# Patient Record
Sex: Male | Born: 1960 | Race: White | Hispanic: No | Marital: Married
Health system: Southern US, Community
[De-identification: ages and names within clinical notes are randomized; demographics above are authoritative.]

---

## 2020-12-10 ENCOUNTER — Emergency Department (HOSPITAL_COMMUNITY): Payer: Medicaid Other

## 2020-12-10 ENCOUNTER — Other Ambulatory Visit: Payer: Self-pay

## 2020-12-10 ENCOUNTER — Inpatient Hospital Stay (HOSPITAL_COMMUNITY): Payer: Medicaid Other

## 2020-12-10 ENCOUNTER — Inpatient Hospital Stay (HOSPITAL_COMMUNITY)
Admission: EM | Admit: 2020-12-10 | Discharge: 2021-02-02 | DRG: 853 | Disposition: A | Discharge status: Skilled Nursing Facility | Payer: Medicaid Other | Attending: Internal Medicine | Admitting: Internal Medicine

## 2020-12-10 DIAGNOSIS — I714 Abdominal aortic aneurysm, without rupture: Secondary | ICD-10-CM | POA: Diagnosis present

## 2020-12-10 DIAGNOSIS — E870 Hyperosmolality and hypernatremia: Secondary | ICD-10-CM | POA: Diagnosis not present

## 2020-12-10 DIAGNOSIS — R54 Age-related physical debility: Secondary | ICD-10-CM | POA: Diagnosis not present

## 2020-12-10 DIAGNOSIS — E785 Hyperlipidemia, unspecified: Secondary | ICD-10-CM | POA: Diagnosis present

## 2020-12-10 DIAGNOSIS — J9600 Acute respiratory failure, unspecified whether with hypoxia or hypercapnia: Secondary | ICD-10-CM | POA: Diagnosis present

## 2020-12-10 DIAGNOSIS — Z01818 Encounter for other preprocedural examination: Secondary | ICD-10-CM

## 2020-12-10 DIAGNOSIS — R451 Restlessness and agitation: Secondary | ICD-10-CM | POA: Diagnosis not present

## 2020-12-10 DIAGNOSIS — K5900 Constipation, unspecified: Secondary | ICD-10-CM | POA: Diagnosis not present

## 2020-12-10 DIAGNOSIS — R1314 Dysphagia, pharyngoesophageal phase: Secondary | ICD-10-CM

## 2020-12-10 DIAGNOSIS — G255 Other chorea: Secondary | ICD-10-CM | POA: Diagnosis present

## 2020-12-10 DIAGNOSIS — Z781 Physical restraint status: Secondary | ICD-10-CM

## 2020-12-10 DIAGNOSIS — Y848 Other medical procedures as the cause of abnormal reaction of the patient, or of later complication, without mention of misadventure at the time of the procedure: Secondary | ICD-10-CM | POA: Diagnosis not present

## 2020-12-10 DIAGNOSIS — A4101 Sepsis due to Methicillin susceptible Staphylococcus aureus: Secondary | ICD-10-CM | POA: Diagnosis not present

## 2020-12-10 DIAGNOSIS — B957 Other staphylococcus as the cause of diseases classified elsewhere: Secondary | ICD-10-CM

## 2020-12-10 DIAGNOSIS — J9602 Acute respiratory failure with hypercapnia: Secondary | ICD-10-CM | POA: Diagnosis present

## 2020-12-10 DIAGNOSIS — T17590A Other foreign object in bronchus causing asphyxiation, initial encounter: Secondary | ICD-10-CM | POA: Diagnosis not present

## 2020-12-10 DIAGNOSIS — N12 Tubulo-interstitial nephritis, not specified as acute or chronic: Secondary | ICD-10-CM

## 2020-12-10 DIAGNOSIS — Z5181 Encounter for therapeutic drug level monitoring: Secondary | ICD-10-CM

## 2020-12-10 DIAGNOSIS — I639 Cerebral infarction, unspecified: Secondary | ICD-10-CM | POA: Diagnosis not present

## 2020-12-10 DIAGNOSIS — R5381 Other malaise: Secondary | ICD-10-CM

## 2020-12-10 DIAGNOSIS — E871 Hypo-osmolality and hyponatremia: Secondary | ICD-10-CM | POA: Diagnosis present

## 2020-12-10 DIAGNOSIS — Z66 Do not resuscitate: Secondary | ICD-10-CM | POA: Diagnosis not present

## 2020-12-10 DIAGNOSIS — T502X5A Adverse effect of carbonic-anhydrase inhibitors, benzothiadiazides and other diuretics, initial encounter: Secondary | ICD-10-CM | POA: Diagnosis not present

## 2020-12-10 DIAGNOSIS — I8391 Asymptomatic varicose veins of right lower extremity: Secondary | ICD-10-CM | POA: Diagnosis not present

## 2020-12-10 DIAGNOSIS — Z20822 Contact with and (suspected) exposure to covid-19: Secondary | ICD-10-CM | POA: Diagnosis present

## 2020-12-10 DIAGNOSIS — R0602 Shortness of breath: Secondary | ICD-10-CM

## 2020-12-10 DIAGNOSIS — J15211 Pneumonia due to Methicillin susceptible Staphylococcus aureus: Secondary | ICD-10-CM | POA: Diagnosis not present

## 2020-12-10 DIAGNOSIS — N179 Acute kidney failure, unspecified: Secondary | ICD-10-CM

## 2020-12-10 DIAGNOSIS — M549 Dorsalgia, unspecified: Secondary | ICD-10-CM

## 2020-12-10 DIAGNOSIS — Z0189 Encounter for other specified special examinations: Secondary | ICD-10-CM

## 2020-12-10 DIAGNOSIS — J9690 Respiratory failure, unspecified, unspecified whether with hypoxia or hypercapnia: Secondary | ICD-10-CM

## 2020-12-10 DIAGNOSIS — I5041 Acute combined systolic (congestive) and diastolic (congestive) heart failure: Secondary | ICD-10-CM | POA: Diagnosis not present

## 2020-12-10 DIAGNOSIS — G8194 Hemiplegia, unspecified affecting left nondominant side: Secondary | ICD-10-CM | POA: Diagnosis not present

## 2020-12-10 DIAGNOSIS — L8990 Pressure ulcer of unspecified site, unspecified stage: Secondary | ICD-10-CM | POA: Insufficient documentation

## 2020-12-10 DIAGNOSIS — G894 Chronic pain syndrome: Secondary | ICD-10-CM | POA: Diagnosis present

## 2020-12-10 DIAGNOSIS — A689 Relapsing fever, unspecified: Secondary | ICD-10-CM | POA: Diagnosis not present

## 2020-12-10 DIAGNOSIS — R0902 Hypoxemia: Secondary | ICD-10-CM

## 2020-12-10 DIAGNOSIS — I808 Phlebitis and thrombophlebitis of other sites: Secondary | ICD-10-CM | POA: Diagnosis not present

## 2020-12-10 DIAGNOSIS — I82B12 Acute embolism and thrombosis of left subclavian vein: Secondary | ICD-10-CM | POA: Diagnosis present

## 2020-12-10 DIAGNOSIS — L89314 Pressure ulcer of right buttock, stage 4: Secondary | ICD-10-CM | POA: Diagnosis not present

## 2020-12-10 DIAGNOSIS — J690 Pneumonitis due to inhalation of food and vomit: Secondary | ICD-10-CM

## 2020-12-10 DIAGNOSIS — N132 Hydronephrosis with renal and ureteral calculous obstruction: Secondary | ICD-10-CM | POA: Diagnosis present

## 2020-12-10 DIAGNOSIS — B9620 Unspecified Escherichia coli [E. coli] as the cause of diseases classified elsewhere: Secondary | ICD-10-CM

## 2020-12-10 DIAGNOSIS — Z978 Presence of other specified devices: Secondary | ICD-10-CM

## 2020-12-10 DIAGNOSIS — D539 Nutritional anemia, unspecified: Secondary | ICD-10-CM | POA: Diagnosis not present

## 2020-12-10 DIAGNOSIS — T82898A Other specified complication of vascular prosthetic devices, implants and grafts, initial encounter: Secondary | ICD-10-CM | POA: Diagnosis not present

## 2020-12-10 DIAGNOSIS — R410 Disorientation, unspecified: Secondary | ICD-10-CM | POA: Diagnosis present

## 2020-12-10 DIAGNOSIS — L89626 Pressure-induced deep tissue damage of left heel: Secondary | ICD-10-CM | POA: Diagnosis not present

## 2020-12-10 DIAGNOSIS — I77810 Thoracic aortic ectasia: Secondary | ICD-10-CM | POA: Diagnosis present

## 2020-12-10 DIAGNOSIS — J329 Chronic sinusitis, unspecified: Secondary | ICD-10-CM | POA: Diagnosis present

## 2020-12-10 DIAGNOSIS — Z419 Encounter for procedure for purposes other than remedying health state, unspecified: Secondary | ICD-10-CM

## 2020-12-10 DIAGNOSIS — N201 Calculus of ureter: Secondary | ICD-10-CM

## 2020-12-10 DIAGNOSIS — F09 Unspecified mental disorder due to known physiological condition: Secondary | ICD-10-CM | POA: Diagnosis not present

## 2020-12-10 DIAGNOSIS — Y828 Other medical devices associated with adverse incidents: Secondary | ICD-10-CM | POA: Diagnosis not present

## 2020-12-10 DIAGNOSIS — E781 Pure hyperglyceridemia: Secondary | ICD-10-CM | POA: Diagnosis not present

## 2020-12-10 DIAGNOSIS — K7581 Nonalcoholic steatohepatitis (NASH): Secondary | ICD-10-CM | POA: Diagnosis present

## 2020-12-10 DIAGNOSIS — R7881 Bacteremia: Secondary | ICD-10-CM

## 2020-12-10 DIAGNOSIS — Z886 Allergy status to analgesic agent status: Secondary | ICD-10-CM

## 2020-12-10 DIAGNOSIS — K567 Ileus, unspecified: Secondary | ICD-10-CM

## 2020-12-10 DIAGNOSIS — S3732XA Contusion of urethra, initial encounter: Secondary | ICD-10-CM | POA: Diagnosis not present

## 2020-12-10 DIAGNOSIS — E876 Hypokalemia: Secondary | ICD-10-CM | POA: Diagnosis present

## 2020-12-10 DIAGNOSIS — J189 Pneumonia, unspecified organism: Secondary | ICD-10-CM

## 2020-12-10 DIAGNOSIS — I10 Essential (primary) hypertension: Secondary | ICD-10-CM

## 2020-12-10 DIAGNOSIS — Z28310 Unvaccinated for covid-19: Secondary | ICD-10-CM

## 2020-12-10 DIAGNOSIS — I110 Hypertensive heart disease with heart failure: Secondary | ICD-10-CM | POA: Diagnosis present

## 2020-12-10 DIAGNOSIS — A419 Sepsis, unspecified organism: Secondary | ICD-10-CM

## 2020-12-10 DIAGNOSIS — R197 Diarrhea, unspecified: Secondary | ICD-10-CM | POA: Diagnosis present

## 2020-12-10 DIAGNOSIS — E861 Hypovolemia: Secondary | ICD-10-CM | POA: Diagnosis not present

## 2020-12-10 DIAGNOSIS — A4151 Sepsis due to Escherichia coli [E. coli]: Principal | ICD-10-CM | POA: Diagnosis present

## 2020-12-10 DIAGNOSIS — E441 Mild protein-calorie malnutrition: Secondary | ICD-10-CM | POA: Diagnosis present

## 2020-12-10 DIAGNOSIS — Z87891 Personal history of nicotine dependence: Secondary | ICD-10-CM

## 2020-12-10 DIAGNOSIS — Z888 Allergy status to other drugs, medicaments and biological substances status: Secondary | ICD-10-CM

## 2020-12-10 DIAGNOSIS — D65 Disseminated intravascular coagulation [defibrination syndrome]: Secondary | ICD-10-CM | POA: Diagnosis present

## 2020-12-10 DIAGNOSIS — T80211A Bloodstream infection due to central venous catheter, initial encounter: Secondary | ICD-10-CM | POA: Diagnosis not present

## 2020-12-10 DIAGNOSIS — Z8616 Personal history of COVID-19: Secondary | ICD-10-CM | POA: Diagnosis not present

## 2020-12-10 DIAGNOSIS — K7460 Unspecified cirrhosis of liver: Secondary | ICD-10-CM | POA: Diagnosis present

## 2020-12-10 DIAGNOSIS — Z9911 Dependence on respirator [ventilator] status: Secondary | ICD-10-CM

## 2020-12-10 DIAGNOSIS — N170 Acute kidney failure with tubular necrosis: Secondary | ICD-10-CM | POA: Diagnosis present

## 2020-12-10 DIAGNOSIS — I82C12 Acute embolism and thrombosis of left internal jugular vein: Secondary | ICD-10-CM | POA: Diagnosis not present

## 2020-12-10 DIAGNOSIS — T8383XA Hemorrhage of genitourinary prosthetic devices, implants and grafts, initial encounter: Secondary | ICD-10-CM | POA: Diagnosis not present

## 2020-12-10 DIAGNOSIS — G928 Other toxic encephalopathy: Secondary | ICD-10-CM | POA: Diagnosis present

## 2020-12-10 DIAGNOSIS — L89313 Pressure ulcer of right buttock, stage 3: Secondary | ICD-10-CM

## 2020-12-10 DIAGNOSIS — Z289 Immunization not carried out for unspecified reason: Secondary | ICD-10-CM

## 2020-12-10 DIAGNOSIS — R6521 Severe sepsis with septic shock: Secondary | ICD-10-CM | POA: Diagnosis not present

## 2020-12-10 DIAGNOSIS — I472 Ventricular tachycardia: Secondary | ICD-10-CM | POA: Diagnosis not present

## 2020-12-10 DIAGNOSIS — F05 Delirium due to known physiological condition: Secondary | ICD-10-CM | POA: Diagnosis present

## 2020-12-10 DIAGNOSIS — E860 Dehydration: Secondary | ICD-10-CM | POA: Diagnosis present

## 2020-12-10 DIAGNOSIS — Z79891 Long term (current) use of opiate analgesic: Secondary | ICD-10-CM

## 2020-12-10 DIAGNOSIS — J9601 Acute respiratory failure with hypoxia: Secondary | ICD-10-CM | POA: Diagnosis not present

## 2020-12-10 DIAGNOSIS — I21A1 Myocardial infarction type 2: Secondary | ICD-10-CM | POA: Diagnosis not present

## 2020-12-10 DIAGNOSIS — J95851 Ventilator associated pneumonia: Secondary | ICD-10-CM | POA: Diagnosis not present

## 2020-12-10 DIAGNOSIS — F0150 Vascular dementia without behavioral disturbance: Secondary | ICD-10-CM | POA: Diagnosis present

## 2020-12-10 DIAGNOSIS — Z8673 Personal history of transient ischemic attack (TIA), and cerebral infarction without residual deficits: Secondary | ICD-10-CM

## 2020-12-10 DIAGNOSIS — Z6841 Body Mass Index (BMI) 40.0 and over, adult: Secondary | ICD-10-CM

## 2020-12-10 DIAGNOSIS — Z792 Long term (current) use of antibiotics: Secondary | ICD-10-CM

## 2020-12-10 DIAGNOSIS — Z4659 Encounter for fitting and adjustment of other gastrointestinal appliance and device: Secondary | ICD-10-CM

## 2020-12-10 DIAGNOSIS — Z0184 Encounter for antibody response examination: Secondary | ICD-10-CM

## 2020-12-10 DIAGNOSIS — Z7984 Long term (current) use of oral hypoglycemic drugs: Secondary | ICD-10-CM

## 2020-12-10 DIAGNOSIS — I6330 Cerebral infarction due to thrombosis of unspecified cerebral artery: Secondary | ICD-10-CM | POA: Insufficient documentation

## 2020-12-10 DIAGNOSIS — Z79899 Other long term (current) drug therapy: Secondary | ICD-10-CM

## 2020-12-10 DIAGNOSIS — R112 Nausea with vomiting, unspecified: Secondary | ICD-10-CM | POA: Diagnosis present

## 2020-12-10 DIAGNOSIS — E1165 Type 2 diabetes mellitus with hyperglycemia: Secondary | ICD-10-CM | POA: Diagnosis present

## 2020-12-10 DIAGNOSIS — Z2821 Immunization not carried out because of patient refusal: Secondary | ICD-10-CM

## 2020-12-10 LAB — BLOOD CULTURE ID PANEL (REFLEXED) - BCID2

## 2020-12-10 LAB — I-STAT ARTERIAL BLOOD GAS, ED
Acid-base deficit: 8 mmol/L — ABNORMAL HIGH (ref 0.0–2.0)
Bicarbonate: 19.4 mmol/L — ABNORMAL LOW (ref 20.0–28.0)
Calcium, Ion: 1.05 mmol/L — ABNORMAL LOW (ref 1.15–1.40)
HCT: 33 % — ABNORMAL LOW (ref 39.0–52.0)
Hemoglobin: 11.2 g/dL — ABNORMAL LOW (ref 13.0–17.0)
O2 Saturation: 97 %
Patient temperature: 104
Potassium: 3.8 mmol/L (ref 3.5–5.1)
Sodium: 136 mmol/L (ref 135–145)
TCO2: 21 mmol/L — ABNORMAL LOW (ref 22–32)
pCO2 arterial: 54 mmHg — ABNORMAL HIGH (ref 32.0–48.0)
pH, Arterial: 7.179 — CL (ref 7.350–7.450)
pO2, Arterial: 128 mmHg — ABNORMAL HIGH (ref 83.0–108.0)

## 2020-12-10 LAB — CBC WITH DIFFERENTIAL/PLATELET
Abs Immature Granulocytes: 0.09 10*3/uL — ABNORMAL HIGH (ref 0.00–0.07)
Basophils Absolute: 0 10*3/uL (ref 0.0–0.1)
Basophils Relative: 1 %
Eosinophils Absolute: 0 10*3/uL (ref 0.0–0.5)
Eosinophils Relative: 0 %
HCT: 40.7 % (ref 39.0–52.0)
Hemoglobin: 13.9 g/dL (ref 13.0–17.0)
Immature Granulocytes: 1 %
Lymphocytes Relative: 8 %
Lymphs Abs: 0.5 10*3/uL — ABNORMAL LOW (ref 0.7–4.0)
MCH: 32.6 pg (ref 26.0–34.0)
MCHC: 34.2 g/dL (ref 30.0–36.0)
MCV: 95.5 fL (ref 80.0–100.0)
Monocytes Absolute: 0.1 10*3/uL (ref 0.1–1.0)
Monocytes Relative: 2 %
Neutro Abs: 5.7 10*3/uL (ref 1.7–7.7)
Neutrophils Relative %: 88 %
Platelets: 50 10*3/uL — ABNORMAL LOW (ref 150–400)
RBC: 4.26 MIL/uL (ref 4.22–5.81)
RDW: 13.6 % (ref 11.5–15.5)
WBC: 6.5 10*3/uL (ref 4.0–10.5)
nRBC: 0 % (ref 0.0–0.2)

## 2020-12-10 LAB — I-STAT CHEM 8, ED
BUN: 59 mg/dL — ABNORMAL HIGH (ref 6–20)
Calcium, Ion: 1.03 mmol/L — ABNORMAL LOW (ref 1.15–1.40)
Chloride: 98 mmol/L (ref 98–111)
Creatinine, Ser: 5.5 mg/dL — ABNORMAL HIGH (ref 0.61–1.24)
Glucose, Bld: 223 mg/dL — ABNORMAL HIGH (ref 70–99)
HCT: 41 % (ref 39.0–52.0)
Hemoglobin: 13.9 g/dL (ref 13.0–17.0)
Potassium: 4.2 mmol/L (ref 3.5–5.1)
Sodium: 133 mmol/L — ABNORMAL LOW (ref 135–145)
TCO2: 21 mmol/L — ABNORMAL LOW (ref 22–32)

## 2020-12-10 LAB — APTT: aPTT: 37 seconds — ABNORMAL HIGH (ref 24–36)

## 2020-12-10 LAB — LACTIC ACID, PLASMA
Lactic Acid, Venous: 7.4 mmol/L (ref 0.5–1.9)
Lactic Acid, Venous: 3.5 mmol/L (ref 0.5–1.9)
Lactic Acid, Venous: 3.4 mmol/L (ref 0.5–1.9)
Lactic Acid, Venous: 2.5 mmol/L (ref 0.5–1.9)

## 2020-12-10 LAB — URINALYSIS, ROUTINE W REFLEX MICROSCOPIC
Bilirubin Urine: NEGATIVE
Glucose, UA: 50 mg/dL — AB
Ketones, ur: NEGATIVE mg/dL
Nitrite: NEGATIVE
Protein, ur: 100 mg/dL — AB
Specific Gravity, Urine: 1.018 (ref 1.005–1.030)
WBC, UA: >50 WBC/hpf — ABNORMAL HIGH (ref 0–5)
pH: 5 (ref 5.0–8.0)

## 2020-12-10 LAB — COMPREHENSIVE METABOLIC PANEL
ALT: 138 U/L — ABNORMAL HIGH (ref 0–44)
AST: <5 U/L — ABNORMAL LOW (ref 15–41)
Albumin: 3 g/dL — ABNORMAL LOW (ref 3.5–5.0)
Alkaline Phosphatase: 57 U/L (ref 38–126)
Anion gap: 17 — ABNORMAL HIGH (ref 5–15)
BUN: 62 mg/dL — ABNORMAL HIGH (ref 6–20)
CO2: 19 mmol/L — ABNORMAL LOW (ref 22–32)
Calcium: 8.3 mg/dL — ABNORMAL LOW (ref 8.9–10.3)
Chloride: 97 mmol/L — ABNORMAL LOW (ref 98–111)
Creatinine, Ser: 5.68 mg/dL — ABNORMAL HIGH (ref 0.61–1.24)
GFR, Estimated: 11 mL/min — ABNORMAL LOW (ref >60)
Glucose, Bld: 230 mg/dL — ABNORMAL HIGH (ref 70–99)
Potassium: 4.2 mmol/L (ref 3.5–5.1)
Sodium: 133 mmol/L — ABNORMAL LOW (ref 135–145)
Total Bilirubin: 2.2 mg/dL — ABNORMAL HIGH (ref 0.3–1.2)
Total Protein: 6.4 g/dL — ABNORMAL LOW (ref 6.5–8.1)

## 2020-12-10 LAB — TROPONIN I (HIGH SENSITIVITY)
Troponin I (High Sensitivity): 534 ng/L (ref <18)
Troponin I (High Sensitivity): 2028 ng/L (ref <18)

## 2020-12-10 LAB — BASIC METABOLIC PANEL
Anion gap: 12 (ref 5–15)
BUN: 66 mg/dL — ABNORMAL HIGH (ref 6–20)
CO2: 19 mmol/L — ABNORMAL LOW (ref 22–32)
Calcium: 7 mg/dL — ABNORMAL LOW (ref 8.9–10.3)
Chloride: 101 mmol/L (ref 98–111)
Creatinine, Ser: 5.36 mg/dL — ABNORMAL HIGH (ref 0.61–1.24)
GFR, Estimated: 12 mL/min — ABNORMAL LOW (ref >60)
Glucose, Bld: 261 mg/dL — ABNORMAL HIGH (ref 70–99)
Potassium: 4.5 mmol/L (ref 3.5–5.1)
Sodium: 132 mmol/L — ABNORMAL LOW (ref 135–145)

## 2020-12-10 LAB — ETHANOL: Alcohol, Ethyl (B): <10 mg/dL (ref <10)

## 2020-12-10 LAB — GLUCOSE, CAPILLARY
Glucose-Capillary: 193 mg/dL — ABNORMAL HIGH (ref 70–99)
Glucose-Capillary: 250 mg/dL — ABNORMAL HIGH (ref 70–99)
Glucose-Capillary: 239 mg/dL — ABNORMAL HIGH (ref 70–99)

## 2020-12-10 LAB — RESP PANEL BY RT-PCR (FLU A&B, COVID) ARPGX2
Influenza A by PCR: NEGATIVE
Influenza B by PCR: NEGATIVE
SARS Coronavirus 2 by RT PCR: NEGATIVE

## 2020-12-10 LAB — MRSA PCR SCREENING: MRSA by PCR: NEGATIVE

## 2020-12-10 LAB — HIV ANTIBODY (ROUTINE TESTING W REFLEX): HIV Screen 4th Generation wRfx: NONREACTIVE

## 2020-12-10 LAB — PROTIME-INR
INR: 1.4 — ABNORMAL HIGH (ref 0.8–1.2)
Prothrombin Time: 16.5 seconds — ABNORMAL HIGH (ref 11.4–15.2)

## 2020-12-10 MED ORDER — SODIUM CHLORIDE 0.9 % IV BOLUS
1000.0000 mL | Freq: Once | INTRAVENOUS | Status: AC
  Administered 2020-12-10: 1000 mL via INTRAVENOUS

## 2020-12-10 MED ORDER — FENTANYL CITRATE (PF) 100 MCG/2ML IJ SOLN: 100.0000 ug | INTRAMUSCULAR | Status: DC | PRN

## 2020-12-10 MED ORDER — ORAL CARE MOUTH RINSE
15.0000 mL | OROMUCOSAL | Status: DC
  Administered 2020-12-10 – 2020-12-28 (×173): 15 mL via OROMUCOSAL

## 2020-12-10 MED ORDER — ACETAMINOPHEN 160 MG/5ML PO SOLN
500.0000 mg | Freq: Four times a day (QID) | ORAL | Status: DC | PRN
  Administered 2020-12-11: 500 mg via ORAL
  Filled 2020-12-10 (×2): qty 20.3

## 2020-12-10 MED ORDER — PIPERACILLIN-TAZOBACTAM IN DEX 2-0.25 GM/50ML IV SOLN
2.2500 g | Freq: Four times a day (QID) | INTRAVENOUS | Status: DC
  Administered 2020-12-10: 2.25 g via INTRAVENOUS
  Filled 2020-12-10 (×2): qty 50

## 2020-12-10 MED ORDER — PANTOPRAZOLE SODIUM 40 MG IV SOLR
40.0000 mg | Freq: Every day | INTRAVENOUS | Status: DC
  Administered 2020-12-10 – 2020-12-13 (×4): 40 mg via INTRAVENOUS
  Filled 2020-12-10 (×4): qty 40

## 2020-12-10 MED ORDER — SODIUM CHLORIDE 0.9 % IV BOLUS (SEPSIS)
1000.0000 mL | Freq: Once | INTRAVENOUS | Status: AC
  Administered 2020-12-10: 1000 mL via INTRAVENOUS

## 2020-12-10 MED ORDER — CHLORHEXIDINE GLUCONATE CLOTH 2 % EX PADS
6.0000 | MEDICATED_PAD | Freq: Every day | CUTANEOUS | Status: DC
  Administered 2020-12-10 – 2021-01-27 (×45): 6 via TOPICAL

## 2020-12-10 MED ORDER — SODIUM BICARBONATE 8.4 % IV SOLN
INTRAVENOUS | Status: AC
  Filled 2020-12-10: qty 50

## 2020-12-10 MED ORDER — SODIUM CHLORIDE 0.9 % IV SOLN
0.5000 mg/kg/h | INTRAVENOUS | Status: DC
  Administered 2020-12-10 – 2020-12-12 (×7): 0.5 mg/kg/h via INTRAVENOUS
  Filled 2020-12-10 (×9): qty 5

## 2020-12-10 MED ORDER — PIPERACILLIN-TAZOBACTAM 3.375 G IVPB
3.3750 g | Freq: Once | INTRAVENOUS | Status: AC
  Administered 2020-12-10: 3.375 g via INTRAVENOUS
  Filled 2020-12-10: qty 50

## 2020-12-10 MED ORDER — CHLORHEXIDINE GLUCONATE 0.12% ORAL RINSE (MEDLINE KIT)
15.0000 mL | Freq: Two times a day (BID) | OROMUCOSAL | Status: DC
  Administered 2020-12-10 – 2020-12-28 (×36): 15 mL via OROMUCOSAL

## 2020-12-10 MED ORDER — INSULIN ASPART 100 UNIT/ML ~~LOC~~ SOLN
1.0000 [IU] | SUBCUTANEOUS | Status: DC
  Administered 2020-12-10 (×3): 3 [IU] via SUBCUTANEOUS
  Administered 2020-12-10 – 2020-12-11 (×2): 2 [IU] via SUBCUTANEOUS
  Administered 2020-12-11: 3 [IU] via SUBCUTANEOUS

## 2020-12-10 MED ORDER — FENTANYL BOLUS VIA INFUSION
50.0000 ug | INTRAVENOUS | Status: DC | PRN
  Administered 2020-12-12 – 2020-12-14 (×10): 50 ug via INTRAVENOUS
  Administered 2020-12-14: 100 ug via INTRAVENOUS
  Administered 2020-12-14 – 2020-12-17 (×9): 50 ug via INTRAVENOUS
  Filled 2020-12-10: qty 50

## 2020-12-10 MED ORDER — DOCUSATE SODIUM 100 MG PO CAPS: 100.0000 mg | ORAL_CAPSULE | Freq: Two times a day (BID) | ORAL | Status: DC | PRN

## 2020-12-10 MED ORDER — AMIODARONE IV BOLUS ONLY 150 MG/100ML
150.0000 mg | Freq: Once | INTRAVENOUS | Status: DC
  Filled 2020-12-10: qty 100

## 2020-12-10 MED ORDER — VANCOMYCIN HCL 1500 MG/300ML IV SOLN
1500.0000 mg | INTRAVENOUS | Status: AC
  Administered 2020-12-10: 1500 mg via INTRAVENOUS
  Filled 2020-12-10: qty 300

## 2020-12-10 MED ORDER — LACTATED RINGERS IV SOLN: INTRAVENOUS | Status: AC

## 2020-12-10 MED ORDER — MIDAZOLAM HCL 2 MG/2ML IJ SOLN: 2.0000 mg | INTRAMUSCULAR | Status: DC | PRN

## 2020-12-10 MED ORDER — ONDANSETRON HCL 4 MG/2ML IJ SOLN
4.0000 mg | Freq: Four times a day (QID) | INTRAMUSCULAR | Status: DC | PRN
  Administered 2021-01-06: 4 mg via INTRAVENOUS
  Filled 2020-12-10: qty 2

## 2020-12-10 MED ORDER — VANCOMYCIN HCL 1000 MG/200ML IV SOLN
1000.0000 mg | Freq: Once | INTRAVENOUS | Status: AC
  Administered 2020-12-10: 1000 mg via INTRAVENOUS
  Filled 2020-12-10: qty 200

## 2020-12-10 MED ORDER — POLYETHYLENE GLYCOL 3350 17 G PO PACK: 17.0000 g | PACK | Freq: Every day | ORAL | Status: DC | PRN

## 2020-12-10 MED ORDER — MIDAZOLAM 50MG/50ML (1MG/ML) PREMIX INFUSION
0.0000 mg/h | INTRAVENOUS | Status: DC
  Administered 2020-12-10: 5 mg/h via INTRAVENOUS
  Filled 2020-12-10: qty 50

## 2020-12-10 MED ORDER — FENTANYL 2500MCG IN NS 250ML (10MCG/ML) PREMIX INFUSION
25.0000 ug/h | INTRAVENOUS | Status: DC
  Administered 2020-12-10: 300 ug/h via INTRAVENOUS
  Administered 2020-12-11: 100 ug/h via INTRAVENOUS
  Administered 2020-12-12: 325 ug/h via INTRAVENOUS
  Administered 2020-12-12: 400 ug/h via INTRAVENOUS
  Administered 2020-12-12: 350 ug/h via INTRAVENOUS
  Administered 2020-12-13 (×4): 400 ug/h via INTRAVENOUS
  Administered 2020-12-14: 250 ug/h via INTRAVENOUS
  Administered 2020-12-14: 385 ug/h via INTRAVENOUS
  Administered 2020-12-14: 400 ug/h via INTRAVENOUS
  Administered 2020-12-15: 200 ug/h via INTRAVENOUS
  Administered 2020-12-15: 350 ug/h via INTRAVENOUS
  Administered 2020-12-16 (×2): 400 ug/h via INTRAVENOUS
  Administered 2020-12-16: 200 ug/h via INTRAVENOUS
  Administered 2020-12-17: 400 ug/h via INTRAVENOUS
  Administered 2020-12-17: 350 ug/h via INTRAVENOUS
  Administered 2020-12-17 (×2): 400 ug/h via INTRAVENOUS
  Administered 2020-12-18: 300 ug/h via INTRAVENOUS
  Administered 2020-12-18: 400 ug/h via INTRAVENOUS
  Administered 2020-12-18: 325 ug/h via INTRAVENOUS
  Administered 2020-12-19 (×2): 350 ug/h via INTRAVENOUS
  Administered 2020-12-19: 375 ug/h via INTRAVENOUS
  Administered 2020-12-19 – 2020-12-21 (×5): 350 ug/h via INTRAVENOUS
  Administered 2020-12-21: 400 ug/h via INTRAVENOUS
  Administered 2020-12-21: 200 ug/h via INTRAVENOUS
  Administered 2020-12-22 (×2): 300 ug/h via INTRAVENOUS
  Administered 2020-12-22 – 2020-12-23 (×3): 400 ug/h via INTRAVENOUS
  Filled 2020-12-10 (×41): qty 250

## 2020-12-10 MED ORDER — SODIUM CHLORIDE 0.9 % IV SOLN
2.0000 g | INTRAVENOUS | Status: DC
  Administered 2020-12-10 – 2020-12-13 (×4): 2 g via INTRAVENOUS
  Filled 2020-12-10 (×4): qty 20

## 2020-12-10 MED ORDER — MIDAZOLAM HCL 2 MG/2ML IJ SOLN
2.0000 mg | INTRAMUSCULAR | Status: DC | PRN
Start: 2020-12-10 — End: 2020-12-10

## 2020-12-10 MED ORDER — NOREPINEPHRINE 4 MG/250ML-% IV SOLN
0.0000 ug/min | INTRAVENOUS | Status: DC
  Administered 2020-12-10: 40 ug/min via INTRAVENOUS
  Administered 2020-12-10: 12 ug/min via INTRAVENOUS
  Administered 2020-12-10 (×2): 39 ug/min via INTRAVENOUS
  Administered 2020-12-10: 34 ug/min via INTRAVENOUS
  Filled 2020-12-10 (×7): qty 250

## 2020-12-10 MED ORDER — LORAZEPAM 2 MG/ML IJ SOLN
INTRAMUSCULAR | Status: AC
  Filled 2020-12-10: qty 1

## 2020-12-10 MED ORDER — MIDAZOLAM BOLUS VIA INFUSION
1.0000 mg | INTRAVENOUS | Status: DC | PRN
  Filled 2020-12-10: qty 2

## 2020-12-10 MED ORDER — HEPARIN SODIUM (PORCINE) 5000 UNIT/ML IJ SOLN
5000.0000 [IU] | Freq: Three times a day (TID) | INTRAMUSCULAR | Status: DC
  Administered 2020-12-10 (×3): 5000 [IU] via SUBCUTANEOUS
  Filled 2020-12-10 (×3): qty 1

## 2020-12-10 MED ORDER — FENTANYL CITRATE (PF) 100 MCG/2ML IJ SOLN: 50.0000 ug | Freq: Once | INTRAMUSCULAR | Status: DC

## 2020-12-10 MED ORDER — NOREPINEPHRINE 4 MG/250ML-% IV SOLN
INTRAVENOUS | Status: AC
  Administered 2020-12-10: 4 mg
  Filled 2020-12-10: qty 250

## 2020-12-10 NOTE — ED Notes (Signed)
Dr. Judd Lien at bedside updated pt's wife

## 2020-12-10 NOTE — ED Notes (Signed)
Critical Lactic 7.4 and Trop534 reported to Autumn RN and Dr. Judd Lien.

## 2020-12-10 NOTE — H&P (Signed)
NAME:  [name], MRN:  [id number], DOB:  07/03/1961, LOS: 0 ADMISSION DATE:  12/10/2020,   History of Present Illness:  This is a 60 year old white male who presented to the emergency room from home.  The patient's wife explains that the patient's had severe diarrhea that started approximately 4 to 5 days ago.  He had also had vomiting that started approximately 24 hours after the diarrhea.  Over the past 24 hours patient has become more confused and at times belligerent.  Patient's wife states that she noticed he became very cold to touch.  EMS was called patient was brought to the emergency room for further evaluation.  While in the emergency room patient found to be hypotensive, combative, confused, and was intubated.  He was noted to have mottling over his trunk and extremities.  Pertinent  Medical History  Prior smoker quit 15 years ago 82-100-pack-year history Sinusitis Chronic pain syndrome Hypertension Hyperlipidemia Chronic back pain  Significant Hospital Events: Including procedures, antibiotic start and stop dates in addition to other pertinent events   . 12/10/2020 patient was intubated . 12/10/2020 triple-lumen catheter inserted  Interim History / Subjective:    Objective   Blood pressure (!) 95/54, pulse (!) 114, temperature (!) 103.4 F (39.7 C), resp. rate (!) 26, height 6\' 2"  (1.88 m), weight (!) 142 kg, SpO2 (!) 73 %.    Vent Mode: PRVC FiO2 (%):  [90 %-100 %] 90 % Set Rate:  [18 bmp-26 bmp] 26 bmp Vt Set:  [660 mL] 660 mL PEEP:  [8 cmH20] 8 cmH20 Plateau Pressure:  [21 cmH20] 21 cmH20  No intake or output data in the 24 hours ending 12/10/20 0741 Filed Weights   12/10/20 0413  Weight: (!) 142 kg    Examination: General: Orally intubated, no acute distress HENT: Atraumatic/normocephalic mucous membranes are moist Lungs: Clear to auscultation no wheezing rales or rhonchi Cardiovascular: Regular rate Abdomen: Soft, nondistended, no  rebound/rigidity/guarding this significantly limited by patient's sedation level. Extremities: Distal pulse intact x4.  Mottling x4 extremities.  Skin is cool and clammy.  No cyanosis Neuro: Unconscious/unresponsive RASS -4 pupils are equal and reactive. GU: Foley catheter intact  Labs/imaging that I havepersonally reviewed  (right click and "Reselect all SmartList Selections" daily)    Resolved Hospital Problem list     Assessment & Plan:  Acute respiratory failure Septic shock Acute renal failure Gastroenteritis Acute encephalopathy Thrombocytopenia   Plan:  Patient be admitted to the intensive care unit for further work-up. Standard ventilator protocols been initiated. Triple-lumen cath catheter was placed. Wean Levophed for MAP greater than 60. Change Versed infusion to ketamine infusion due to the hypotension that the Versed seems to be inducing. Continue fentanyl infusion. Broad-spectrum antibiotics are initiated. Blood cultures are pending. Consult nephrology for acute renal failure. Foley catheter in place. Monitor I's/O's.   Updated patient's wife at bedside.  Best practice (right click and "Reselect all SmartList Selections" daily)  Diet:  NPO Pain/Anxiety/Delirium protocol (if indicated): Yes (RASS goal -1) VAP protocol (if indicated): Yes DVT prophylaxis: Subcutaneous Heparin and SCD GI prophylaxis: PPI Glucose control:  SSI No Central venous access:  Yes, and it is still needed Arterial line:  N/A Foley:  Yes, and it is still needed Mobility:  bed rest  PT consulted: N/A Last date of multidisciplinary goals of care discussion []  Code Status:  full code Disposition: Admit to ICU  Labs   CBC: Recent Labs  Lab 12/10/20 0350 12/10/20 0408 12/10/20 0625  WBC  6.5  --   --   NEUTROABS 5.7  --   --   HGB 13.9 13.9 11.2*  HCT 40.7 41.0 33.0*  MCV 95.5  --   --   PLT 50*  --   --     Basic Metabolic Panel: Recent Labs  Lab 12/10/20 0350  12/10/20 0408 12/10/20 0625  NA 133* 133* 136  K 4.2 4.2 3.8  CL 97* 98  --   CO2 19*  --   --   GLUCOSE 230* 223*  --   BUN 62* 59*  --   CREATININE 5.68* 5.50*  --   CALCIUM 8.3*  --   --    GFR: Estimated Creatinine Clearance: 22 mL/min (A) (by C-G formula based on SCr of 5.5 mg/dL (H)). Recent Labs  Lab 12/10/20 0350 12/10/20 0416  WBC 6.5  --   LATICACIDVEN  --  7.4*    Liver Function Tests: Recent Labs  Lab 12/10/20 0350  AST <5*  ALT 138*  ALKPHOS 57  BILITOT 2.2*  PROT 6.4*  ALBUMIN 3.0*   No results for input(s): LIPASE, AMYLASE in the last 168 hours. No results for input(s): AMMONIA in the last 168 hours.  ABG    Component Value Date/Time   PHART 7.179 (LL) 12/10/2020 0625   PCO2ART 54.0 (H) 12/10/2020 0625   PO2ART 128 (H) 12/10/2020 0625   HCO3 19.4 (L) 12/10/2020 0625   TCO2 21 (L) 12/10/2020 0625   ACIDBASEDEF 8.0 (H) 12/10/2020 0625   O2SAT 97.0 12/10/2020 0625     Coagulation Profile: Recent Labs  Lab 12/10/20 0350  INR 1.4*    Cardiac Enzymes: No results for input(s): CKTOTAL, CKMB, CKMBINDEX, TROPONINI in the last 168 hours.  HbA1C: No results found for: HGBA1C  CBG: No results for input(s): GLUCAP in the last 168 hours.  Review of Systems:   Unable to perform secondary to patient's sedation.  Past Medical History:  He,  has no past medical history on file.   Surgical History:     Social History:      Family History:  His family history is not on file.   Allergies Allergies  Allergen Reactions  . Ibuprofen Other (See Comments)    Blood in the stool.  . Metformin Other (See Comments)    Tremors, muscles  locking up, unable to control extremities     Home Medications  Prior to Admission medications   Medication Sig Start Date End Date Taking? Authorizing Provider  atorvastatin (LIPITOR) 40 MG tablet Take 40 mg by mouth daily.   Yes [provider]  glipiZIDE (GLUCOTROL) 10 MG tablet Take 10 mg by  mouth every morning. 06/18/20  Yes [provider]  oxyCODONE (OXYCONTIN) 10 mg 12 hr tablet Take 10 mg by mouth in the morning and at bedtime.   Yes [provider]  oxymetazoline (AFRIN) 0.05 % nasal spray Place 1 spray into both nostrils 2 (two) times daily as needed for congestion.   Yes [provider]  rOPINIRole (REQUIP) 1 MG tablet Take 1 mg by mouth in the morning and at bedtime.   Yes [provider]     Critical care time:65mins

## 2020-12-10 NOTE — ED Notes (Signed)
CCM at bedside 

## 2020-12-10 NOTE — ED Triage Notes (Signed)
Pt transported from home by [facility] with AMS x 4-5 hours, pt found to be confused, mottled, diaphoretic.HR 260, sys less than 90. Pt defib at 100 then 360 by EMS. 20 L FA, NS given. Per EMS pts wife states he has had diarrhea x several days.

## 2020-12-10 NOTE — ED Provider Notes (Signed)
[facility] EMERGENCY DEPARTMENT Provider Note   CSN: [id number] Arrival date & time: 12/10/20  3570     History Chief Complaint  Patient presents with  . Altered Mental Status    [name] is a 60 y.o. male.  Patient is a 60 year old male with past medical history of diabetes, hypertension, obesity.  Patient brought by EMS for evaluation of altered mental status.  I have been told that for the past 4 days, patient has had diarrhea, weakness, and feeling generally unwell.  He told his wife he thought he had food poisoning.  This evening he became more confused and disoriented.  Patient's wife called 911 who found the patient to be obtunded, confused, and in what appears to be ventricular tachycardia.  Patient was then cardioverted into ventricular fibrillation, then returned to sinus rhythm.  He arrives here confused, encephalopathic, mottled, and critically ill.  He is found to have a fever of 103.4 upon presentation.  The history is provided by the EMS personnel and the patient.       No past medical history on file.  There are no problems to display for this patient.        No family history on file.     Home Medications Prior to Admission medications   Not on File    Allergies    Patient has no allergy information on record.  Review of Systems   Review of Systems  Unable to perform ROS: Acuity of condition    Physical Exam Updated Vital Signs BP (!) 95/48 (BP Location: Right Arm)   Pulse (!) 124   Temp (!) 103.4 F (39.7 C) (Rectal)   Resp (!) 26   Ht 6\' 2"  (1.88 m)   Wt (!) 142 kg   SpO2 90%   BMI 40.19 kg/m   Physical Exam Vitals and nursing note reviewed.  Constitutional:      Comments: Patient arrives here responsive, but extremely anxious and critically ill appearing.  His skin is mottled.  HENT:     Head: Normocephalic and atraumatic.     Mouth/Throat:     Mouth: Mucous membranes are dry.  Cardiovascular:     Rate  and Rhythm: Regular rhythm. Tachycardia present.  Pulmonary:     Effort: No respiratory distress.     Breath sounds: No rhonchi.  Abdominal:     General: There is no distension.     Tenderness: There is no abdominal tenderness. There is no guarding or rebound.  Musculoskeletal:     Cervical back: Normal range of motion and neck supple. No rigidity or tenderness.  Skin:    Comments: There is mottling to the skin.  Neurological:     Mental Status: He is disoriented.     Cranial Nerves: No cranial nerve deficit.     Comments: Patient arrives here responsive and answers questions appropriately.  He appears intermittently confused and encephalopathic.     ED Results / Procedures / Treatments   Labs (all labs ordered are listed, but only abnormal results are displayed) Labs Reviewed  CBC WITH DIFFERENTIAL/PLATELET - Abnormal; Notable for the following components:      Result Value   Platelets 50 (*)    Lymphs Abs 0.5 (*)    Abs Immature Granulocytes 0.09 (*)    All other components within normal limits  PROTIME-INR - Abnormal; Notable for the following components:   Prothrombin Time 16.5 (*)    INR 1.4 (*)    All  other components within normal limits  APTT - Abnormal; Notable for the following components:   aPTT 37 (*)    All other components within normal limits  I-STAT CHEM 8, ED - Abnormal; Notable for the following components:   Sodium 133 (*)    BUN 59 (*)    Creatinine, Ser 5.50 (*)    Glucose, Bld 223 (*)    Calcium, Ion 1.03 (*)    TCO2 21 (*)    All other components within normal limits  URINE CULTURE  CULTURE, BLOOD (ROUTINE X 2)  CULTURE, BLOOD (ROUTINE X 2)  RESP PANEL BY RT-PCR (FLU A&B, COVID) ARPGX2  ETHANOL  COMPREHENSIVE METABOLIC PANEL  LACTIC ACID, PLASMA  LACTIC ACID, PLASMA  URINALYSIS, ROUTINE W REFLEX MICROSCOPIC  TROPONIN I (HIGH SENSITIVITY)    EKG None  Radiology DG Chest Port 1 View  Result Date: 12/10/2020 CLINICAL DATA:  60 year old  male with altered mental status. Found confused, diaphoretic, tachycardic. Status post a fibrillation. EXAM: PORTABLE CHEST 1 VIEW COMPARISON:  Chest radiographs 07/05/2018. FINDINGS: Portable AP semi upright view at 0353 hours. Pacer or resuscitation pads project over the left chest and epigastrium. Lower lung volumes. Mediastinal contours are stable and within normal limits. Irregular left lateral contour of the trachea, similar but progressed since 2019. Carina and mainstem airways seem to remain patent. No pneumothorax, pulmonary edema, pleural effusion or confluent pulmonary opacity. Mild chronic increased interstitial markings in both lungs appears stable. No acute osseous abnormality identified. IMPRESSION: 1. Irregular contour of the left tracheal wall of unclear etiology and significance. This seems chronic but increased from 2019. Recommend follow-up Chest CT (IV contrast preferred) when feasible to further characterize. 2. No superimposed acute cardiopulmonary abnormality identified. Electronically Signed   By: Odessa Fleming M.D.   On: 12/10/2020 04:02    Procedures Procedures   Medications Ordered in ED Medications  vancomycin (VANCOREADY) IVPB 1000 mg/200 mL (1,000 mg Intravenous New Bag/Given 12/10/20 0407)  lactated ringers infusion (has no administration in time range)  sodium chloride 0.9 % bolus 1,000 mL (1,000 mLs Intravenous New Bag/Given 12/10/20 0402)    And  sodium chloride 0.9 % bolus 1,000 mL (has no administration in time range)    And  sodium chloride 0.9 % bolus 1,000 mL (has no administration in time range)    And  sodium chloride 0.9 % bolus 1,000 mL (has no administration in time range)  amiodarone (NEXTERONE) IV bolus only 150 mg/100 mL (has no administration in time range)  LORazepam (ATIVAN) 2 MG/ML injection (has no administration in time range)  midazolam (VERSED) 50 mg/50 mL (1 mg/mL) premix infusion (has no administration in time range)  midazolam (VERSED) bolus via  infusion 1-2 mg (has no administration in time range)  fentaNYL in NS (72mcg/ml) infusion-PREMIX (has no administration in time range)  fentaNYL (SUBLIMAZE) bolus via infusion 50 mcg (has no administration in time range)  sodium chloride 0.9 % bolus 1,000 mL (1,000 mLs Intravenous New Bag/Given 12/10/20 0406)  sodium chloride 0.9 % bolus 1,000 mL (1,000 mLs Intravenous New Bag/Given 12/10/20 0403)  piperacillin-tazobactam (ZOSYN) IVPB 3.375 g (0 g Intravenous Stopped 12/10/20 0450)    ED Course  I have reviewed the triage vital signs and the nursing notes.  Pertinent labs & imaging results that were available during my care of the patient were reviewed by me and considered in my medical decision making (see chart for details).    MDM Rules/Calculators/A&P  Patient is a 60 year old  male brought by EMS for evaluation of weakness.  Patient has had diarrhea for the past 5 days, then this evening became altered and confused.  Patient arrives here encephalopathic, mottled, and febrile to 103.4.  Is also tachycardic but initially normotensive.  Presentation most consistent with sepsis of undetermined etiology.  Work-up initiated into this revealing lactate of 7.4, creatinine of 5.5, and evidence for UTI in the urinalysis.  Sepsis fluids initiated and vancomycin and Zosyn started for sepsis of unknown etiology.  As the emergency department course proceeded, but patient became more encephalopathic and uncooperative.  He was attempting to pull off EKG leads, attempted to remove his IV and urinary catheter and became more confused.  At this point, intubation was performed using RSI.  Patient was given 20 mg of etomidate, 200 mg of succinylcholine.  Cords were easily visualized using the glide scope and a 7.5 endotracheal tube was easily placed.  Tube placement confirmed with direct visualization, end-tidal CO2, and auscultation over the chest and stomach.  Critical care was consulted and  has evaluated and will admit.  I have updated the patient's wife who is present at bedside.  CRITICAL CARE Performed by: Geoffery Lyons Total critical care time: 70 minutes Critical care time was exclusive of separately billable procedures and treating other patients. Critical care was necessary to treat or prevent imminent or life-threatening deterioration. Critical care was time spent personally by me on the following activities: development of treatment plan with patient and/or surrogate as well as nursing, discussions with consultants, evaluation of patient's response to treatment, examination of patient, obtaining history from patient or surrogate, ordering and performing treatments and interventions, ordering and review of laboratory studies, ordering and review of radiographic studies, pulse oximetry and re-evaluation of patient's condition.   Final Clinical Impression(s) / ED Diagnoses Final diagnoses:  None    Rx / DC Orders ED Discharge Orders    None       Geoffery Lyons, MD 12/10/20 [phone]

## 2020-12-10 NOTE — ED Notes (Signed)
CCM NP at bedside for central line placement

## 2020-12-10 NOTE — ED Notes (Signed)
Attempted to contact wife, by calling both home numbers and cell number [phone].  No answer and was not able to leave a message.

## 2020-12-10 NOTE — Progress Notes (Signed)
PHARMACY - PHYSICIAN COMMUNICATION CRITICAL VALUE ALERT - BLOOD CULTURE IDENTIFICATION (BCID)  [name] is an 60 y.o. male who presented to [facility] on 12/10/2020 with a chief complaint of severe sepsis  Assessment:  4/4 Blood cultures positive for E. coli - suspected source unclear - likely UTI vs intraabdominal  Name of physician (or Provider) Contacted: Dr. Chestine Spore  Current antibiotics: Zosyn  Changes to prescribed antibiotics recommended:  Change Zosyn to Rocephin  Results for orders placed or performed during the hospital encounter of 12/10/20  Blood Culture ID Panel (Reflexed) (Collected: 12/10/2020  3:50 AM)  Result Value Ref Range   Enterococcus faecalis NOT DETECTED NOT DETECTED   Enterococcus Faecium NOT DETECTED NOT DETECTED   Listeria monocytogenes NOT DETECTED NOT DETECTED   Staphylococcus species NOT DETECTED NOT DETECTED   Staphylococcus aureus (BCID) NOT DETECTED NOT DETECTED   Staphylococcus epidermidis NOT DETECTED NOT DETECTED   Staphylococcus lugdunensis NOT DETECTED NOT DETECTED   Streptococcus species NOT DETECTED NOT DETECTED   Streptococcus agalactiae NOT DETECTED NOT DETECTED   Streptococcus pneumoniae NOT DETECTED NOT DETECTED   Streptococcus pyogenes NOT DETECTED NOT DETECTED   A.calcoaceticus-baumannii NOT DETECTED NOT DETECTED   Bacteroides fragilis NOT DETECTED NOT DETECTED   Enterobacterales DETECTED (A) NOT DETECTED   Enterobacter cloacae complex NOT DETECTED NOT DETECTED   Escherichia coli DETECTED (A) NOT DETECTED   Klebsiella aerogenes NOT DETECTED NOT DETECTED   Klebsiella oxytoca NOT DETECTED NOT DETECTED   Klebsiella pneumoniae NOT DETECTED NOT DETECTED   Proteus species NOT DETECTED NOT DETECTED   Salmonella species NOT DETECTED NOT DETECTED   Serratia marcescens NOT DETECTED NOT DETECTED   Haemophilus influenzae NOT DETECTED NOT DETECTED   Neisseria meningitidis NOT DETECTED NOT DETECTED   Pseudomonas aeruginosa NOT DETECTED NOT  DETECTED   Stenotrophomonas maltophilia NOT DETECTED NOT DETECTED   Candida albicans NOT DETECTED NOT DETECTED   Candida auris NOT DETECTED NOT DETECTED   Candida glabrata NOT DETECTED NOT DETECTED   Candida krusei NOT DETECTED NOT DETECTED   Candida parapsilosis NOT DETECTED NOT DETECTED   Candida tropicalis NOT DETECTED NOT DETECTED   Cryptococcus neoformans/gattii NOT DETECTED NOT DETECTED   CTX-M ESBL NOT DETECTED NOT DETECTED   Carbapenem resistance IMP NOT DETECTED NOT DETECTED   Carbapenem resistance KPC NOT DETECTED NOT DETECTED   Carbapenem resistance NDM NOT DETECTED NOT DETECTED   Carbapenem resist OXA 48 LIKE NOT DETECTED NOT DETECTED   Carbapenem resistance VIM NOT DETECTED NOT DETECTED   Jeanella Cara, PharmD, FCCM Clinical Pharmacist Please see AMION for all Pharmacists' Contact Phone Numbers 12/10/2020, 2:43 PM

## 2020-12-10 NOTE — ED Notes (Signed)
ABG drawn by RT

## 2020-12-10 NOTE — Sepsis Progress Note (Signed)
Monitoring for the code sepsis protocol. °

## 2020-12-10 NOTE — Plan of Care (Signed)
Febrile to 103 Trop 2028>> suspect related to arrhythmias & DCCV (unsure if required chest compressions) EKG without ischemic changes overnight LA 7.4>3.5  Plan: Cooling blanket Adding SSI Repeat BMP & lactate now Wean O2; may need increased PEEP if oxygen requirements remain high RUQ [place] to evaluate hyperbilirubinemia with his GI sx PTA  Steffanie Dunn, DO 12/10/20 12:21 PM [facility]  For contact information, see Amion. If no response to pager, please call PCCM consult pager. After hours, 7PM- 7AM, please call Elink.

## 2020-12-10 NOTE — Procedures (Signed)
Central Venous Catheter Insertion Procedure Note  [name]  [id number]  07/22/1961  Date:12/10/20  Time:7:01 AM   Provider Performing:Achillies Buehl Lacretia Nicks Mikey Bussing   Procedure: Insertion of Non-tunneled Central Venous [phone]) with US guidance ([id number])   Indication(s) Medication administration  Consent Risks of the procedure as well as the alternatives and risks of each were explained to the patient and/or caregiver.  Consent for the procedure was obtained and is signed in the bedside chart  Anesthesia Topical only with 1% lidocaine   Timeout Verified patient identification, verified procedure, site/side was marked, verified correct patient position, special equipment/implants available, medications/allergies/relevant history reviewed, required imaging and test results available.  Sterile Technique Maximal sterile technique including full sterile barrier drape, hand hygiene, sterile gown, sterile gloves, mask, hair covering, sterile ultrasound probe cover (if used).  Procedure Description Area of catheter insertion was cleaned with chlorhexidine and draped in sterile fashion.  With real-time ultrasound guidance a central venous catheter was placed into the left internal jugular vein. Nonpulsatile blood flow and easy flushing noted in all ports.  The catheter was sutured in place and sterile dressing applied.  Complications/Tolerance None; patient tolerated the procedure well. Chest X-ray is ordered to verify placement for internal jugular or subclavian cannulation.   Chest x-ray is not ordered for femoral cannulation.  EBL Minimal  Specimen(s) None   [name], AGACNP-BC [facility]  See Amion for personal pager PCCM on call pager [phone] until 7pm. Please call Elink 7p-7a. [phone]  12/10/2020 7:02 AM

## 2020-12-10 NOTE — Progress Notes (Signed)
Patient transported to 2M10 from ED without complications. RN at bedside. 

## 2020-12-11 ENCOUNTER — Inpatient Hospital Stay (HOSPITAL_COMMUNITY): Payer: Medicaid Other

## 2020-12-11 DIAGNOSIS — J9602 Acute respiratory failure with hypercapnia: Secondary | ICD-10-CM

## 2020-12-11 DIAGNOSIS — I255 Ischemic cardiomyopathy: Secondary | ICD-10-CM

## 2020-12-11 LAB — POCT I-STAT 7, (LYTES, BLD GAS, ICA,H+H)
Acid-base deficit: 6 mmol/L — ABNORMAL HIGH (ref 0.0–2.0)
Bicarbonate: 19.5 mmol/L — ABNORMAL LOW (ref 20.0–28.0)
Calcium, Ion: 1.06 mmol/L — ABNORMAL LOW (ref 1.15–1.40)
HCT: 34 % — ABNORMAL LOW (ref 39.0–52.0)
Hemoglobin: 11.6 g/dL — ABNORMAL LOW (ref 13.0–17.0)
O2 Saturation: 96 %
Patient temperature: 99.5
Potassium: 4.5 mmol/L (ref 3.5–5.1)
Sodium: 135 mmol/L (ref 135–145)
TCO2: 21 mmol/L — ABNORMAL LOW (ref 22–32)
pCO2 arterial: 39.5 mmHg (ref 32.0–48.0)
pH, Arterial: 7.303 — ABNORMAL LOW (ref 7.350–7.450)
pO2, Arterial: 88 mmHg (ref 83.0–108.0)

## 2020-12-11 LAB — GLUCOSE, CAPILLARY
Glucose-Capillary: 218 mg/dL — ABNORMAL HIGH (ref 70–99)
Glucose-Capillary: 232 mg/dL — ABNORMAL HIGH (ref 70–99)
Glucose-Capillary: 178 mg/dL — ABNORMAL HIGH (ref 70–99)
Glucose-Capillary: 182 mg/dL — ABNORMAL HIGH (ref 70–99)
Glucose-Capillary: 178 mg/dL — ABNORMAL HIGH (ref 70–99)
Glucose-Capillary: 236 mg/dL — ABNORMAL HIGH (ref 70–99)
Glucose-Capillary: 231 mg/dL — ABNORMAL HIGH (ref 70–99)

## 2020-12-11 LAB — BASIC METABOLIC PANEL
Anion gap: 12 (ref 5–15)
BUN: 70 mg/dL — ABNORMAL HIGH (ref 6–20)
CO2: 20 mmol/L — ABNORMAL LOW (ref 22–32)
Calcium: 7.1 mg/dL — ABNORMAL LOW (ref 8.9–10.3)
Chloride: 100 mmol/L (ref 98–111)
Creatinine, Ser: 4.86 mg/dL — ABNORMAL HIGH (ref 0.61–1.24)
GFR, Estimated: 13 mL/min — ABNORMAL LOW (ref >60)
Glucose, Bld: 256 mg/dL — ABNORMAL HIGH (ref 70–99)
Potassium: 4.7 mmol/L (ref 3.5–5.1)
Sodium: 132 mmol/L — ABNORMAL LOW (ref 135–145)

## 2020-12-11 LAB — DIC (DISSEMINATED INTRAVASCULAR COAGULATION)PANEL
D-Dimer, Quant: 5.67 ug/mL-FEU — ABNORMAL HIGH (ref 0.00–0.50)
Fibrinogen: >800 mg/dL — ABNORMAL HIGH (ref 210–475)
INR: 1.2 (ref 0.8–1.2)
Platelets: 44 10*3/uL — ABNORMAL LOW (ref 150–400)
Prothrombin Time: 14.7 seconds (ref 11.4–15.2)
Smear Review: NONE SEEN
aPTT: 34 seconds (ref 24–36)

## 2020-12-11 LAB — ECHOCARDIOGRAM COMPLETE
Area-P 1/2: 4.06 cm2
Height: 74 in
S' Lateral: 4.2 cm
Weight: 5291.04 oz

## 2020-12-11 LAB — CBC
HCT: 34.8 % — ABNORMAL LOW (ref 39.0–52.0)
Hemoglobin: 11.9 g/dL — ABNORMAL LOW (ref 13.0–17.0)
MCH: 33.2 pg (ref 26.0–34.0)
MCHC: 34.2 g/dL (ref 30.0–36.0)
MCV: 97.2 fL (ref 80.0–100.0)
Platelets: 35 10*3/uL — ABNORMAL LOW (ref 150–400)
RBC: 3.58 MIL/uL — ABNORMAL LOW (ref 4.22–5.81)
RDW: 14.6 % (ref 11.5–15.5)
WBC: 8.4 10*3/uL (ref 4.0–10.5)
nRBC: 0 % (ref 0.0–0.2)

## 2020-12-11 LAB — HEMOGLOBIN A1C
Hgb A1c MFr Bld: 7.3 % — ABNORMAL HIGH (ref 4.8–5.6)
Mean Plasma Glucose: 162.81 mg/dL

## 2020-12-11 LAB — TROPONIN I (HIGH SENSITIVITY): Troponin I (High Sensitivity): 655 ng/L (ref <18)

## 2020-12-11 LAB — MAGNESIUM: Magnesium: 2.2 mg/dL (ref 1.7–2.4)

## 2020-12-11 LAB — PHOSPHORUS: Phosphorus: 5.4 mg/dL — ABNORMAL HIGH (ref 2.5–4.6)

## 2020-12-11 MED ORDER — POLYETHYLENE GLYCOL 3350 17 G PO PACK: 17.0000 g | PACK | Freq: Every day | ORAL | Status: DC

## 2020-12-11 MED ORDER — VITAL AF 1.2 CAL PO LIQD
1000.0000 mL | ORAL | Status: DC
  Administered 2020-12-11 – 2020-12-29 (×21): 1000 mL
  Filled 2020-12-11 (×5): qty 1000

## 2020-12-11 MED ORDER — INSULIN DETEMIR 100 UNIT/ML ~~LOC~~ SOLN
15.0000 [IU] | Freq: Two times a day (BID) | SUBCUTANEOUS | Status: DC
  Administered 2020-12-11 (×2): 15 [IU] via SUBCUTANEOUS
  Filled 2020-12-11 (×5): qty 0.15

## 2020-12-11 MED ORDER — LACTATED RINGERS IV SOLN: INTRAVENOUS | Status: DC

## 2020-12-11 MED ORDER — HEPARIN SODIUM (PORCINE) 5000 UNIT/ML IJ SOLN: 5000.0000 [IU] | Freq: Three times a day (TID) | INTRAMUSCULAR | Status: DC

## 2020-12-11 MED ORDER — POLYETHYLENE GLYCOL 3350 17 G PO PACK
17.0000 g | PACK | Freq: Every day | ORAL | Status: DC
  Administered 2020-12-11: 17 g via ORAL
  Filled 2020-12-11: qty 1

## 2020-12-11 MED ORDER — SODIUM CHLORIDE 0.9% FLUSH
10.0000 mL | Freq: Two times a day (BID) | INTRAVENOUS | Status: DC
  Administered 2020-12-11 – 2020-12-25 (×24): 10 mL
  Administered 2020-12-26: 30 mL
  Administered 2020-12-26: 20 mL
  Administered 2020-12-27 – 2020-12-28 (×2): 10 mL
  Administered 2020-12-28: 40 mL
  Administered 2020-12-29 – 2021-01-19 (×37): 10 mL
  Administered 2021-01-19: 20 mL
  Administered 2021-01-20 – 2021-02-02 (×25): 10 mL

## 2020-12-11 MED ORDER — ACETAMINOPHEN 160 MG/5ML PO SOLN
500.0000 mg | Freq: Four times a day (QID) | ORAL | Status: DC | PRN
  Administered 2020-12-12 – 2021-01-05 (×36): 500 mg
  Filled 2020-12-11 (×38): qty 20.3

## 2020-12-11 MED ORDER — INSULIN ASPART 100 UNIT/ML ~~LOC~~ SOLN
0.0000 [IU] | SUBCUTANEOUS | Status: DC
  Administered 2020-12-11: 5 [IU] via SUBCUTANEOUS
  Administered 2020-12-11 (×2): 3 [IU] via SUBCUTANEOUS
  Administered 2020-12-12: 5 [IU] via SUBCUTANEOUS
  Administered 2020-12-12: 8 [IU] via SUBCUTANEOUS
  Administered 2020-12-12: 3 [IU] via SUBCUTANEOUS
  Administered 2020-12-12: 5 [IU] via SUBCUTANEOUS
  Administered 2020-12-12: 3 [IU] via SUBCUTANEOUS
  Administered 2020-12-12 (×2): 5 [IU] via SUBCUTANEOUS
  Administered 2020-12-13 (×4): 3 [IU] via SUBCUTANEOUS
  Administered 2020-12-13: 5 [IU] via SUBCUTANEOUS
  Administered 2020-12-14 (×3): 3 [IU] via SUBCUTANEOUS
  Administered 2020-12-14 – 2020-12-15 (×4): 2 [IU] via SUBCUTANEOUS
  Administered 2020-12-15: 3 [IU] via SUBCUTANEOUS
  Administered 2020-12-16: 2 [IU] via SUBCUTANEOUS
  Administered 2020-12-16: 3 [IU] via SUBCUTANEOUS
  Administered 2020-12-16: 2 [IU] via SUBCUTANEOUS
  Administered 2020-12-16 (×2): 3 [IU] via SUBCUTANEOUS
  Administered 2020-12-17 – 2020-12-18 (×2): 2 [IU] via SUBCUTANEOUS
  Administered 2020-12-18 (×2): 3 [IU] via SUBCUTANEOUS
  Administered 2020-12-18 – 2020-12-20 (×7): 2 [IU] via SUBCUTANEOUS
  Administered 2020-12-20 (×3): 3 [IU] via SUBCUTANEOUS
  Administered 2020-12-21: 2 [IU] via SUBCUTANEOUS
  Administered 2020-12-21 (×2): 3 [IU] via SUBCUTANEOUS
  Administered 2020-12-21 – 2020-12-22 (×5): 2 [IU] via SUBCUTANEOUS
  Administered 2020-12-22 (×3): 3 [IU] via SUBCUTANEOUS
  Administered 2020-12-23: 2 [IU] via SUBCUTANEOUS
  Administered 2020-12-23 (×2): 3 [IU] via SUBCUTANEOUS
  Administered 2020-12-23: 2 [IU] via SUBCUTANEOUS
  Administered 2020-12-23 (×2): 3 [IU] via SUBCUTANEOUS
  Administered 2020-12-24: 2 [IU] via SUBCUTANEOUS
  Administered 2020-12-24: 3 [IU] via SUBCUTANEOUS
  Administered 2020-12-24 (×2): 2 [IU] via SUBCUTANEOUS
  Administered 2020-12-24: 3 [IU] via SUBCUTANEOUS
  Administered 2020-12-24: 5 [IU] via SUBCUTANEOUS
  Administered 2020-12-25: 3 [IU] via SUBCUTANEOUS
  Administered 2020-12-25 (×2): 5 [IU] via SUBCUTANEOUS

## 2020-12-11 MED ORDER — DOCUSATE SODIUM 50 MG/5ML PO LIQD: 100.0000 mg | Freq: Two times a day (BID) | ORAL | Status: DC | PRN

## 2020-12-11 MED ORDER — SODIUM CHLORIDE 0.9% FLUSH: 10.0000 mL | INTRAVENOUS | Status: DC | PRN

## 2020-12-11 MED ORDER — PERFLUTREN LIPID MICROSPHERE
1.0000 mL | INTRAVENOUS | Status: AC | PRN
  Administered 2020-12-11: 3 mL via INTRAVENOUS
  Filled 2020-12-11: qty 10

## 2020-12-11 MED ORDER — PROSOURCE TF PO LIQD
90.0000 mL | Freq: Three times a day (TID) | ORAL | Status: DC
  Administered 2020-12-11 – 2020-12-29 (×51): 90 mL
  Filled 2020-12-11 (×52): qty 90

## 2020-12-11 NOTE — Progress Notes (Signed)
NAME:  [name], MRN:  [id number], DOB:  [birth year], LOS: 1 ADMISSION DATE:  12/10/2020,   History of Present Illness:  This is a 60 year old white male who presented to the emergency room from home.  The patient's wife explains that the patient's had severe diarrhea that started approximately 4 to 5 days ago.  He had also had vomiting that started approximately 24 hours after the diarrhea.  Over the past 24 hours patient has become more confused and at times belligerent.  Patient's wife states that she noticed he became very cold to touch.  EMS was called patient was brought to the emergency room for further evaluation.  While in the emergency room patient found to be hypotensive, combative, confused, and was intubated.  He was noted to have mottling over his trunk and extremities.  Pertinent  Medical History  Prior smoker quit 15 years ago 82-100-pack-year history Sinusitis Chronic pain syndrome Hypertension Hyperlipidemia Chronic back pain  Significant Hospital Events: Including procedures, antibiotic start and stop dates in addition to other pertinent events   . 12/10/2020 patient was intubated . 12/10/2020 triple-lumen catheter inserted . 12/11/2020 off pressors. E coli bacteremia, e coli uti. Some improvement in pulm opacities on CXR. On rocephin   Interim History / Subjective:  Has been weaned off NE  On ketamine + fent for sedation as versed caused significant hypotension  Weaning fent   Objective   Blood pressure 109/73, pulse (!) 101, temperature 99.5 F (37.5 C), temperature source Bladder, resp. rate 18, height 6\' 2"  (1.88 m), weight (!) 150 kg, SpO2 99 %.    Vent Mode: CPAP;PSV FiO2 (%):  [40 %-100 %] 40 % Set Rate:  [26 bmp] 26 bmp Vt Set:  [660 mL] 660 mL PEEP:  [8 cmH20] 8 cmH20 Pressure Support:  [15 cmH20] 15 cmH20 Plateau Pressure:  [26 cmH20-32 cmH20] 26 cmH20   Intake/Output Summary (Last 24 hours) at 12/11/2020 1000 Last data filed at 12/11/2020  02/10/2021 Gross per 24 hour  Intake 3807.2 ml  Output 1825 ml  Net 1982.2 ml   Filed Weights   12/10/20 0413 12/11/20 0457  Weight: (!) 142 kg (!) 150 kg    Examination: General: Obese chronically and critically ill middle aged M intubated sedated NAD HENT: NCAT pink mm ETT secure, oral airway present  Lungs: Mechanically ventilated, symmetrical chest expansion, diminished bibasilar sounds  Cardiovascular: rrr cap refill < 3sec 1+ radial pulses  Abdomen: Obese, soft, hypoactive  Extremities: No acute deformity, no cyanosis or clubbing  Neuro: Sedated. Slight grimace to pain. PERRL sluggish.  GU: Foley Skin: cool to touch, mottled.   Labs/imaging that I havepersonally reviewed  (right click and "Reselect all SmartList Selections" daily)   CXR- bilateral infiltrates with some interval improvement.  CBC- wbc 8.4, plt 35  Bmp- na 132, k 4.7, Glu 256, Cr 4.86  Resolved Hospital Problem list     Assessment & Plan:   Acute metabolic encephalopathy -septic shock, renal failure P -RASS goal 0 to -1 -wean fent ket as able -renal failure and sepsis as below   Acute respiratory failure with hypoxia Bilateral infiltrates, suspect PNA P -Full MV support  -PRN CXR -wean MV support as able   Septic shock due to e.coli bacteremia, e. coli UTI, suspected PNA P -MAP goal > 65 -- NE as needed  -rocephin   Acute renal failure -trend renal indices -renal 02/10/21   Gastroenteritis  -cont IVF and supportive care   Thrombocytopenia  -do not  think this is HIT -more likely to be r/t gram neg bacteremia, septic shock P -DIC panel  -trend  -SCDs   Hyperglycemia -SSI + levemir   Inadequate PO intake -RDN consult for EN    Best practice (right click and "Reselect all SmartList Selections" daily)  Diet:  Tube Feed  Pain/Anxiety/Delirium protocol (if indicated): Yes (RASS goal -1) VAP protocol (if indicated): Yes DVT prophylaxis: SCD GI prophylaxis: PPI Glucose control:  SSI  No Central venous access:  Yes, and it is still needed Arterial line:  N/A Foley:  Yes, and it is still needed Mobility:  bed rest  PT consulted: N/A Last date of multidisciplinary goals of care discussion -- pending Code Status:  full code Disposition: ICU  Labs   CBC: Recent Labs  Lab 12/10/20 0350 12/10/20 0408 12/10/20 0625 12/11/20 0114  WBC 6.5  --   --  8.4  NEUTROABS 5.7  --   --   --   HGB 13.9 13.9 11.2* 11.9*  HCT 40.7 41.0 33.0* 34.8*  MCV 95.5  --   --  97.2  PLT 50*  --   --  35*    Basic Metabolic Panel: Recent Labs  Lab 12/10/20 0350 12/10/20 0408 12/10/20 0625 12/10/20 1407 12/11/20 0114  NA 133* 133* 136 132* 132*  K 4.2 4.2 3.8 4.5 4.7  CL 97* 98  --  101 100  CO2 19*  --   --  19* 20*  GLUCOSE 230* 223*  --  261* 256*  BUN 62* 59*  --  66* 70*  CREATININE 5.68* 5.50*  --  5.36* 4.86*  CALCIUM 8.3*  --   --  7.0* 7.1*  MG  --   --   --   --  2.2  PHOS  --   --   --   --  5.4*   GFR: Estimated Creatinine Clearance: 25.6 mL/min (A) (by C-G formula based on SCr of 4.86 mg/dL (H)). Recent Labs  Lab 12/10/20 0350 12/10/20 0416 12/10/20 0745 12/10/20 1407 12/10/20 2030 12/11/20 0114  WBC 6.5  --   --   --   --  8.4  LATICACIDVEN  --  7.4* 3.5* 3.4* 2.5*  --     Liver Function Tests: Recent Labs  Lab 12/10/20 0350  AST <5*  ALT 138*  ALKPHOS 57  BILITOT 2.2*  PROT 6.4*  ALBUMIN 3.0*   No results for input(s): LIPASE, AMYLASE in the last 168 hours. No results for input(s): AMMONIA in the last 168 hours.  ABG    Component Value Date/Time   PHART 7.179 (LL) 12/10/2020 0625   PCO2ART 54.0 (H) 12/10/2020 0625   PO2ART 128 (H) 12/10/2020 0625   HCO3 19.4 (L) 12/10/2020 0625   TCO2 21 (L) 12/10/2020 0625   ACIDBASEDEF 8.0 (H) 12/10/2020 0625   O2SAT 97.0 12/10/2020 0625     Coagulation Profile: Recent Labs  Lab 12/10/20 0350  INR 1.4*    Cardiac Enzymes: No results for input(s): CKTOTAL, CKMB, CKMBINDEX, TROPONINI in  the last 168 hours.  HbA1C: Hgb A1c MFr Bld  Date/Time Value Ref Range Status  12/11/2020 09:18 AM 7.3 (H) 4.8 - 5.6 % Final    Comment:    (NOTE) Pre diabetes:          5.7%-6.4%  Diabetes:              >6.4%  Glycemic control for   <7.0% adults with diabetes     CBG:  Recent Labs  Lab 12/10/20 1630 12/10/20 2038 12/10/20 2342 12/11/20 0358 12/11/20 0755  GLUCAP 250* 239* 232* 218* 178*    CRITICAL CARE Performed by: Lanier Clam   Total critical care time: 50 minutes  Critical care time was exclusive of separately billable procedures and treating other patients. Critical care was necessary to treat or prevent imminent or life-threatening deterioration.  Critical care was time spent personally by me on the following activities: development of treatment plan with patient and/or surrogate as well as nursing, discussions with consultants, evaluation of patient's response to treatment, examination of patient, obtaining history from patient or surrogate, ordering and performing treatments and interventions, ordering and review of laboratory studies, ordering and review of radiographic studies, pulse oximetry and re-evaluation of patient's condition.  Tessie Fass MSN, AGACNP-BC [facility] [phone] If no answer, [phone] 12/11/2020, 10:01 AM

## 2020-12-11 NOTE — Progress Notes (Signed)
  Echocardiogram 2D Echocardiogram has been performed.  [name] 12/11/2020, 3:07 PM

## 2020-12-11 NOTE — Progress Notes (Signed)
Initial Nutrition Assessment  DOCUMENTATION CODES:   Morbid obesity  INTERVENTION:   Initiate tube feeding via OG tube: Vital AF 1.2 at 65 ml/h (1560 ml per day) Prosource TF 90 ml TID  Provides 2112 kcal, 183 gm protein, 1265 ml free water daily.  NUTRITION DIAGNOSIS:   Inadequate oral intake related to inability to eat as evidenced by NPO status.  GOAL:   Provide needs based on ASPEN/SCCM guidelines  MONITOR:   Vent status,Labs,TF tolerance  REASON FOR ASSESSMENT:   Ventilator,Consult Enteral/tube feeding initiation and management  ASSESSMENT:   60 yo male admitted with acute metabolic encephalopathy, acute respiratory failure, septic shock, E coli bacteremia, E coli UTI. PMH includes recent severe diarrhea and vomiting, chronic pain, HTN, HLD.   Discussed patient in ICU rounds and with RN today. OG tube in place. Received MD Consult for TF initiation and management.   Patient is currently intubated on ventilator support MV: 12.5 L/min Temp (24hrs), Avg:100.3 F (37.9 C), Min:98.6 F (37 C), Max:102 F (38.9 C)   Labs reviewed. A1C 7.3, Na 132, phos 5.4 CBG: [phone]  Medications reviewed and include novolog, levemir, miralax, ketamine, levophed. IVF: LR at 125 ml/h  No weight history available for review.  NUTRITION - FOCUSED PHYSICAL EXAM:  Flowsheet Row Most Recent Value  Orbital Region No depletion  Upper Arm Region No depletion  Thoracic and Lumbar Region No depletion  Buccal Region No depletion  Temple Region No depletion  Clavicle Bone Region No depletion  Clavicle and Acromion Bone Region No depletion  Scapular Bone Region Unable to assess  Dorsal Hand No depletion  Patellar Region No depletion  Anterior Thigh Region No depletion  Posterior Calf Region No depletion  Edema (RD Assessment) Mild  Hair Reviewed  Eyes Unable to assess  Mouth Unable to assess  Skin Reviewed  Nails Reviewed       Diet Order:   Diet Order             Diet NPO time specified  Diet effective now                 EDUCATION NEEDS:   Not appropriate for education at this time  Skin:  Skin Assessment: Reviewed RN Assessment  Last BM:  no BM documented  Height:   Ht Readings from Last 1 Encounters:  12/10/20 6\' 2"  (1.88 m)    Weight:   Wt Readings from Last 1 Encounters:  12/11/20 (!) 150 kg    Ideal Body Weight:  86.4 kg  BMI:  Body mass index is 42.46 kg/m.  Estimated Nutritional Needs:   Kcal:  1650-2100  Protein:  173-216 gm  Fluid:  >/= 2 L    02/10/21, RD, LDN, CNSC Please refer to Amion for contact information.

## 2020-12-11 NOTE — Progress Notes (Signed)
Renal US done, can probably give [place] info a CT abd would show.  Myrla Halsted MD PCCM

## 2020-12-11 NOTE — Progress Notes (Signed)
eLink Physician-Brief Progress Note Patient Name: [name] DOB: Aug 10, 1961 MRN: [id number]   Date of Service  12/11/2020  HPI/Events of Note  Thrombocytopenia - Platelets = 50K--> 35K. HIT?  eICU Interventions  Plan: 1. D/C Heparin [place]. 2. HIT Antibody now. 3. Already has SCD's ordered.      Intervention Category Major Interventions: Other:  Lenell Antu 12/11/2020, 5:31 AM

## 2020-12-12 ENCOUNTER — Inpatient Hospital Stay (HOSPITAL_COMMUNITY): Payer: Medicaid Other

## 2020-12-12 LAB — CBC
HCT: 32.4 % — ABNORMAL LOW (ref 39.0–52.0)
Hemoglobin: 10.9 g/dL — ABNORMAL LOW (ref 13.0–17.0)
MCH: 32 pg (ref 26.0–34.0)
MCHC: 33.6 g/dL (ref 30.0–36.0)
MCV: 95 fL (ref 80.0–100.0)
Platelets: 35 10*3/uL — ABNORMAL LOW (ref 150–400)
RBC: 3.41 MIL/uL — ABNORMAL LOW (ref 4.22–5.81)
RDW: 14.6 % (ref 11.5–15.5)
WBC: 6.5 10*3/uL (ref 4.0–10.5)
nRBC: 0 % (ref 0.0–0.2)

## 2020-12-12 LAB — CULTURE, BLOOD (ROUTINE X 2): Special Requests: ADEQUATE

## 2020-12-12 LAB — COMPREHENSIVE METABOLIC PANEL
ALT: 170 U/L — ABNORMAL HIGH (ref 0–44)
AST: 211 U/L — ABNORMAL HIGH (ref 15–41)
Albumin: 1.9 g/dL — ABNORMAL LOW (ref 3.5–5.0)
Alkaline Phosphatase: 69 U/L (ref 38–126)
Anion gap: 10 (ref 5–15)
BUN: 85 mg/dL — ABNORMAL HIGH (ref 6–20)
CO2: 19 mmol/L — ABNORMAL LOW (ref 22–32)
Calcium: 7.6 mg/dL — ABNORMAL LOW (ref 8.9–10.3)
Chloride: 103 mmol/L (ref 98–111)
Creatinine, Ser: 3.89 mg/dL — ABNORMAL HIGH (ref 0.61–1.24)
GFR, Estimated: 17 mL/min — ABNORMAL LOW (ref >60)
Glucose, Bld: 262 mg/dL — ABNORMAL HIGH (ref 70–99)
Potassium: 3.9 mmol/L (ref 3.5–5.1)
Sodium: 132 mmol/L — ABNORMAL LOW (ref 135–145)
Total Bilirubin: 4.2 mg/dL — ABNORMAL HIGH (ref 0.3–1.2)
Total Protein: 5.5 g/dL — ABNORMAL LOW (ref 6.5–8.1)

## 2020-12-12 LAB — URINE CULTURE: Culture: >=100000 — AB

## 2020-12-12 LAB — GLUCOSE, CAPILLARY
Glucose-Capillary: 162 mg/dL — ABNORMAL HIGH (ref 70–99)
Glucose-Capillary: 173 mg/dL — ABNORMAL HIGH (ref 70–99)
Glucose-Capillary: 215 mg/dL — ABNORMAL HIGH (ref 70–99)
Glucose-Capillary: 227 mg/dL — ABNORMAL HIGH (ref 70–99)
Glucose-Capillary: 233 mg/dL — ABNORMAL HIGH (ref 70–99)
Glucose-Capillary: 268 mg/dL — ABNORMAL HIGH (ref 70–99)

## 2020-12-12 LAB — AMMONIA: Ammonia: 44 umol/L — ABNORMAL HIGH (ref 9–35)

## 2020-12-12 LAB — PHOSPHORUS: Phosphorus: 4 mg/dL (ref 2.5–4.6)

## 2020-12-12 LAB — HEPARIN INDUCED PLATELET AB (HIT ANTIBODY): Heparin Induced Plt Ab: 0.14 OD (ref 0.000–0.400)

## 2020-12-12 LAB — MAGNESIUM: Magnesium: 2.7 mg/dL — ABNORMAL HIGH (ref 1.7–2.4)

## 2020-12-12 MED ORDER — ALBUMIN HUMAN 25 % IV SOLN
25.0000 g | Freq: Four times a day (QID) | INTRAVENOUS | Status: AC
  Administered 2020-12-12 – 2020-12-13 (×4): 25 g via INTRAVENOUS
  Filled 2020-12-12 (×4): qty 100

## 2020-12-12 MED ORDER — DOCUSATE SODIUM 50 MG/5ML PO LIQD
100.0000 mg | Freq: Two times a day (BID) | ORAL | Status: DC
  Administered 2020-12-12: 100 mg via ORAL
  Filled 2020-12-12: qty 10

## 2020-12-12 MED ORDER — SORBITOL 70 % SOLN
60.0000 mL | Freq: Every day | Status: DC
  Administered 2020-12-13: 60 mL
  Filled 2020-12-12: qty 60

## 2020-12-12 MED ORDER — INSULIN ASPART 100 UNIT/ML ~~LOC~~ SOLN
4.0000 [IU] | SUBCUTANEOUS | Status: DC
  Administered 2020-12-12 – 2020-12-13 (×6): 4 [IU] via SUBCUTANEOUS

## 2020-12-12 MED ORDER — DOCUSATE SODIUM 50 MG/5ML PO LIQD: 100.0000 mg | Freq: Two times a day (BID) | ORAL | Status: DC | PRN

## 2020-12-12 MED ORDER — POLYETHYLENE GLYCOL 3350 17 G PO PACK
17.0000 g | PACK | Freq: Two times a day (BID) | ORAL | Status: DC
  Administered 2020-12-12 – 2020-12-22 (×15): 17 g
  Filled 2020-12-12 (×15): qty 1

## 2020-12-12 MED ORDER — MIDAZOLAM HCL 2 MG/2ML IJ SOLN
2.0000 mg | INTRAMUSCULAR | Status: DC | PRN
  Administered 2020-12-12 – 2020-12-15 (×8): 2 mg via INTRAVENOUS
  Filled 2020-12-12 (×11): qty 2

## 2020-12-12 MED ORDER — INSULIN DETEMIR 100 UNIT/ML ~~LOC~~ SOLN
30.0000 [IU] | Freq: Two times a day (BID) | SUBCUTANEOUS | Status: DC
  Administered 2020-12-12 – 2020-12-17 (×11): 30 [IU] via SUBCUTANEOUS
  Filled 2020-12-12 (×13): qty 0.3

## 2020-12-12 MED ORDER — DOCUSATE SODIUM 50 MG/5ML PO LIQD
100.0000 mg | Freq: Two times a day (BID) | ORAL | Status: DC
  Administered 2020-12-12 – 2020-12-22 (×15): 100 mg
  Filled 2020-12-12 (×16): qty 10

## 2020-12-12 NOTE — Progress Notes (Signed)
70 ML of Ketamine were wasted in the steri cycle and witness by Layne Benton, RN.

## 2020-12-12 NOTE — Progress Notes (Signed)
NAME:  [name], MRN:  [id number], DOB:  Dec 05, 1960, LOS: 2 ADMISSION DATE:  12/10/2020,   History of Present Illness:  This is a 60 year old white male who presented to the emergency room from home.  The patient's wife explains that the patient's had severe diarrhea that started approximately 4 to 5 days ago.  He had also had vomiting that started approximately 24 hours after the diarrhea.  Over the past 24 hours patient has become more confused and at times belligerent.  Patient's wife states that she noticed he became very cold to touch.  EMS was called patient was brought to the emergency room for further evaluation.  While in the emergency room patient found to be hypotensive, combative, confused, and was intubated.  He was noted to have mottling over his trunk and extremities.  Pertinent  Medical History  Prior smoker quit 15 years ago 82-100-pack-year history Sinusitis Chronic pain syndrome Hypertension Hyperlipidemia Chronic back pain  Significant Hospital Events: Including procedures, antibiotic start and stop dates in addition to other pertinent events   . 12/10/2020 patient was intubated . 12/10/2020 triple-lumen catheter inserted . 12/11/2020 off pressors. E coli bacteremia, e coli uti. Some improvement in pulm opacities on CXR. On rocephin   Interim History / Subjective:  Tachypneic and agitated with sedation wean, not following commands. On cooling blanket trying to spike fevers.  Objective   Blood pressure 109/86, pulse 88, temperature 100.2 F (37.9 C), temperature source Bladder, resp. rate (!) 25, height 6\' 2"  (1.88 m), weight (!) 153.7 kg, SpO2 100 %.    Vent Mode: PRVC FiO2 (%):  [40 %-60 %] 40 % Set Rate:  [26 bmp] 26 bmp Vt Set:  mL] 660 mL PEEP:  [8 cmH20] 8 cmH20 Pressure Support:  [15 cmH20] 15 cmH20 Plateau Pressure:  [24 cmH20-25 cmH20] 24 cmH20   Intake/Output Summary (Last 24 hours) at 12/12/2020 0746 Last data filed at 12/12/2020 0740 Gross per  24 hour  Intake 3801.09 ml  Output 2360 ml  Net 1441.09 ml   Filed Weights   12/10/20 0413 12/11/20 0457 12/12/20 0500  Weight: (!) 142 kg (!) 150 kg (!) 153.7 kg    Examination: Constitutional: ill appearing obese man on vent  Eyes: not tracking but pupils equal and reactive Ears, nose, mouth, and throat: ETT with minimal secretions Cardiovascular: borderline tachycardic, ext warm Respiratory: rhonci bilaterally, triggers vent Gastrointestinal: protuberant, hypoactive bS Skin: No rashes, normal turgor Neurologic: moves all 4 ext but not following commands Psychiatric: RASS +1    Labs/imaging that I havepersonally reviewed  (right click and "Reselect all SmartList Selections" daily)  BUN up, Cr improved Trop downtrending Shock liver Stable thrombocytopenia  Resolved Hospital Problem list     Assessment & Plan:  Acute hypercarbic respiratory failure inability to compensate for increased metabolic demand; question aspiration pneumonitis - Remains acidemic on BMP and acidemic with SBT 4/1 - Needs more time - Vent support, VAP prevention bundle - CXR in AM  Septic shock due to E coli UTI with bacteremia- renal 6/1 benign - Continue rocephin, f/u E coli sensitivities  Acute renal failure - septic ATN; Cr 2021 0.7 - nonoliguric - continue IVF, add albumin - will need diuresis prior to vent liberation  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 - LFTs up a bit, trend; INR okay  Sepsis-associated DIC - stable, trend plts  DM2 with hyperglycemia - increase levemir; add TF coverage  Stress cardiomyopathy- w/ type 2 NSTEMI, would benefit from repeating  as OP to make sure not masking ischemic heart disease  Abdominal distension- check KUB, may still end up getting CT A/P if continued fevers  Best practice (right click and "Reselect all SmartList Selections" daily)  Diet:  Tube Feed  Pain/Anxiety/Delirium protocol (if indicated): Yes (RASS goal -1) VAP protocol (if indicated):  Yes DVT prophylaxis: SCD GI prophylaxis: PPI Glucose control:  See above Central venous access:  Yes, and it is still needed Arterial line:  N/A Foley:  Yes, and it is still needed Mobility:  bed rest  PT consulted: N/A Last date of multidisciplinary goals of care discussion -- called wife 4/2: voicemail Code Status:  full code Disposition: ICU   Patient critically ill due to respiratory failure, renal failure Interventions to address this today ventilator titration Risk of deterioration without these interventions is high  I personally spent 35 minutes providing critical care not including any separately billable procedures  Myrla Halsted MD [facility]  Prefer epic messenger for cross cover needs If after hours, please call E-link

## 2020-12-12 NOTE — Progress Notes (Signed)
Called and updated wife.  All questions answered.  Myrla Halsted MD PCCM

## 2020-12-13 ENCOUNTER — Inpatient Hospital Stay (HOSPITAL_COMMUNITY): Payer: Medicaid Other

## 2020-12-13 LAB — GLUCOSE, CAPILLARY
Glucose-Capillary: 173 mg/dL — ABNORMAL HIGH (ref 70–99)
Glucose-Capillary: 201 mg/dL — ABNORMAL HIGH (ref 70–99)
Glucose-Capillary: 198 mg/dL — ABNORMAL HIGH (ref 70–99)
Glucose-Capillary: 192 mg/dL — ABNORMAL HIGH (ref 70–99)
Glucose-Capillary: 150 mg/dL — ABNORMAL HIGH (ref 70–99)
Glucose-Capillary: 161 mg/dL — ABNORMAL HIGH (ref 70–99)

## 2020-12-13 LAB — CBC
HCT: 33.2 % — ABNORMAL LOW (ref 39.0–52.0)
Hemoglobin: 11.1 g/dL — ABNORMAL LOW (ref 13.0–17.0)
MCH: 32.7 pg (ref 26.0–34.0)
MCHC: 33.4 g/dL (ref 30.0–36.0)
MCV: 97.9 fL (ref 80.0–100.0)
Platelets: 64 10*3/uL — ABNORMAL LOW (ref 150–400)
RBC: 3.39 MIL/uL — ABNORMAL LOW (ref 4.22–5.81)
RDW: 14.9 % (ref 11.5–15.5)
WBC: 9.1 10*3/uL (ref 4.0–10.5)
nRBC: 0 % (ref 0.0–0.2)

## 2020-12-13 LAB — PHOSPHORUS: Phosphorus: 3.6 mg/dL (ref 2.5–4.6)

## 2020-12-13 LAB — COMPREHENSIVE METABOLIC PANEL
ALT: 139 U/L — ABNORMAL HIGH (ref 0–44)
AST: 150 U/L — ABNORMAL HIGH (ref 15–41)
Albumin: 2.6 g/dL — ABNORMAL LOW (ref 3.5–5.0)
Alkaline Phosphatase: 91 U/L (ref 38–126)
Anion gap: 8 (ref 5–15)
BUN: 81 mg/dL — ABNORMAL HIGH (ref 6–20)
CO2: 22 mmol/L (ref 22–32)
Calcium: 8.3 mg/dL — ABNORMAL LOW (ref 8.9–10.3)
Chloride: 110 mmol/L (ref 98–111)
Creatinine, Ser: 2.82 mg/dL — ABNORMAL HIGH (ref 0.61–1.24)
GFR, Estimated: 25 mL/min — ABNORMAL LOW (ref >60)
Glucose, Bld: 193 mg/dL — ABNORMAL HIGH (ref 70–99)
Potassium: 3.7 mmol/L (ref 3.5–5.1)
Sodium: 140 mmol/L (ref 135–145)
Total Bilirubin: 1.9 mg/dL — ABNORMAL HIGH (ref 0.3–1.2)
Total Protein: 6.1 g/dL — ABNORMAL LOW (ref 6.5–8.1)

## 2020-12-13 LAB — MAGNESIUM: Magnesium: 2.6 mg/dL — ABNORMAL HIGH (ref 1.7–2.4)

## 2020-12-13 MED ORDER — POTASSIUM CHLORIDE 20 MEQ PO PACK
40.0000 meq | PACK | Freq: Two times a day (BID) | ORAL | Status: AC
  Administered 2020-12-13 (×2): 40 meq
  Filled 2020-12-13 (×2): qty 2

## 2020-12-13 MED ORDER — FUROSEMIDE 10 MG/ML IJ SOLN
40.0000 mg | Freq: Four times a day (QID) | INTRAMUSCULAR | Status: AC
  Administered 2020-12-13 (×2): 40 mg via INTRAVENOUS
  Filled 2020-12-13 (×2): qty 4

## 2020-12-13 MED ORDER — INSULIN ASPART 100 UNIT/ML ~~LOC~~ SOLN
8.0000 [IU] | SUBCUTANEOUS | Status: DC
  Administered 2020-12-13 – 2020-12-17 (×19): 8 [IU] via SUBCUTANEOUS

## 2020-12-13 MED ORDER — PROPOFOL 1000 MG/100ML IV EMUL
5.0000 ug/kg/min | INTRAVENOUS | Status: DC
  Administered 2020-12-13 (×2): 20 ug/kg/min via INTRAVENOUS
  Administered 2020-12-13 (×2): 40 ug/kg/min via INTRAVENOUS
  Administered 2020-12-14: 20 ug/kg/min via INTRAVENOUS
  Administered 2020-12-14: 5 ug/kg/min via INTRAVENOUS
  Administered 2020-12-14: 30 ug/kg/min via INTRAVENOUS
  Administered 2020-12-15 (×3): 20 ug/kg/min via INTRAVENOUS
  Administered 2020-12-15: 15 ug/kg/min via INTRAVENOUS
  Administered 2020-12-16 (×2): 20 ug/kg/min via INTRAVENOUS
  Filled 2020-12-13 (×14): qty 100

## 2020-12-13 MED ORDER — PROPOFOL 1000 MG/100ML IV EMUL
INTRAVENOUS | Status: AC
  Administered 2020-12-13: 20 ug/kg/min via INTRAVENOUS
  Filled 2020-12-13: qty 100

## 2020-12-13 NOTE — Progress Notes (Signed)
NAME:  [name], MRN:  [id number], DOB:  15-Sep-1960, LOS: 3 ADMISSION DATE:  12/10/2020,   History of Present Illness:  This is a 60 year old white male who presented to the emergency room from home.  The patient's wife explains that the patient's had severe diarrhea that started approximately 4 to 5 days ago.  He had also had vomiting that started approximately 24 hours after the diarrhea.  Over the past 24 hours patient has become more confused and at times belligerent.  Patient's wife states that she noticed he became very cold to touch.  EMS was called patient was brought to the emergency room for further evaluation.  While in the emergency room patient found to be hypotensive, combative, confused, and was intubated.  He was noted to have mottling over his trunk and extremities.  Pertinent  Medical History  Prior smoker quit 15 years ago 82-100-pack-year history Sinusitis Chronic pain syndrome Hypertension Hyperlipidemia Chronic back pain  Significant Hospital Events: Including procedures, antibiotic start and stop dates in addition to other pertinent events   . 12/10/2020 patient was intubated . 12/10/2020 triple-lumen catheter inserted . 12/11/2020 off pressors. E coli bacteremia, e coli uti. Some improvement in pulm opacities on CXR. On rocephin   Interim History / Subjective:  A little more awake today. Remains on vent. Off pressors.  Objective   Blood pressure (!) 129/92, pulse 91, temperature 100.1 F (37.8 C), temperature source Bladder, resp. rate (!) 26, height 6\' 2"  (1.88 m), weight (!) 154.4 kg, SpO2 98 %.    Vent Mode: PRVC FiO2 (%):  [40 %-50 %] 50 % Set Rate:  [26 bmp] 26 bmp Vt Set:  mL] 660 mL PEEP:  [5 cmH20-8 cmH20] 8 cmH20 Pressure Support:  [8 cmH20] 8 cmH20 Plateau Pressure:  [24 cmH20-33 cmH20] 24 cmH20   Intake/Output Summary (Last 24 hours) at 12/13/2020 0733 Last data filed at 12/13/2020 02/12/2021 Gross per 24 hour  Intake 5623.33 ml  Output 3387  ml  Net 2236.33 ml   Filed Weights   12/11/20 0457 12/12/20 0500 12/13/20 0251  Weight: (!) 150 kg (!) 153.7 kg (!) 154.4 kg    Examination: Constitutional: ill appearing man on vent  Eyes: pupils equal, reactive Ears, nose, mouth, and throat: ETT in place, minimal secretions Cardiovascular: RRR, ext warm Respiratory: passive on vent, scattered rhonci Gastrointestinal: more soft, +BS Skin: No rashes, normal turgor Neurologic: moves all 4 ext once weaned but not to command Psychiatric: heavily sedated at present   Labs/imaging that I havepersonally reviewed  (right click and "Reselect all SmartList Selections" daily)  Cr improving K a bit low  Plts improving CXR pulmonary edema  Resolved Hospital Problem list     Assessment & Plan:  Acute hypercarbic respiratory failure inability to compensate for increased metabolic demand; question aspiration pneumonitis Septic ATN- improving - Will start trying to diurese, will need about 5L off before extubation is reasonable - Vent support, VAP prevention bundle  Septic shock due to E coli UTI with bacteremia- renal 02/12/21 benign - Continue rocephin x 10 days  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 [place] - LFTs up a bit suspect congestive, trend; INR okay  Sepsis-associated DIC - improved  DM2 with hyperglycemia - continue levemir; increase TF coverage  Stress cardiomyopathy- w/ type 2 NSTEMI, would benefit from repeating as OP to make sure not masking ischemic heart disease  Constipation- improved, dc sorbitol  Best practice (right click and "Reselect all SmartList Selections" daily)  Diet:  Tube Feed  Pain/Anxiety/Delirium protocol (if indicated): Yes (RASS goal -1) VAP protocol (if indicated): Yes DVT prophylaxis: SCD GI prophylaxis: PPI Glucose control:  See above Central venous access:  Yes, and it is still needed Arterial line:  N/A Foley:  Yes, and it is still needed Mobility:  bed rest  PT consulted: N/A Last date of  multidisciplinary goals of care discussion -- wife updated 4/2, told her it would be a bit before can get him off vent Code Status:  full code Disposition: ICU   Patient critically ill due to respiratory failure, renal failure Interventions to address this today ventilator titration Risk of deterioration without these interventions is high  I personally spent 35 minutes providing critical care not including any separately billable procedures  Myrla Halsted MD [facility]  Prefer epic messenger for cross cover needs If after hours, please call E-link

## 2020-12-14 LAB — COMPREHENSIVE METABOLIC PANEL
ALT: 95 U/L — ABNORMAL HIGH (ref 0–44)
AST: 88 U/L — ABNORMAL HIGH (ref 15–41)
Albumin: 2 g/dL — ABNORMAL LOW (ref 3.5–5.0)
Alkaline Phosphatase: 101 U/L (ref 38–126)
Anion gap: 8 (ref 5–15)
BUN: 97 mg/dL — ABNORMAL HIGH (ref 6–20)
CO2: 23 mmol/L (ref 22–32)
Calcium: 8.3 mg/dL — ABNORMAL LOW (ref 8.9–10.3)
Chloride: 113 mmol/L — ABNORMAL HIGH (ref 98–111)
Creatinine, Ser: 2.73 mg/dL — ABNORMAL HIGH (ref 0.61–1.24)
GFR, Estimated: 26 mL/min — ABNORMAL LOW (ref >60)
Glucose, Bld: 144 mg/dL — ABNORMAL HIGH (ref 70–99)
Potassium: 3.7 mmol/L (ref 3.5–5.1)
Sodium: 144 mmol/L (ref 135–145)
Total Bilirubin: 1.3 mg/dL — ABNORMAL HIGH (ref 0.3–1.2)
Total Protein: 5.2 g/dL — ABNORMAL LOW (ref 6.5–8.1)

## 2020-12-14 LAB — TRIGLYCERIDES: Triglycerides: 422 mg/dL — ABNORMAL HIGH (ref <150)

## 2020-12-14 LAB — GLUCOSE, CAPILLARY
Glucose-Capillary: 165 mg/dL — ABNORMAL HIGH (ref 70–99)
Glucose-Capillary: 139 mg/dL — ABNORMAL HIGH (ref 70–99)
Glucose-Capillary: 102 mg/dL — ABNORMAL HIGH (ref 70–99)
Glucose-Capillary: 151 mg/dL — ABNORMAL HIGH (ref 70–99)
Glucose-Capillary: 112 mg/dL — ABNORMAL HIGH (ref 70–99)
Glucose-Capillary: 94 mg/dL (ref 70–99)

## 2020-12-14 LAB — CBC
HCT: 29.3 % — ABNORMAL LOW (ref 39.0–52.0)
Hemoglobin: 10 g/dL — ABNORMAL LOW (ref 13.0–17.0)
MCH: 32.2 pg (ref 26.0–34.0)
MCHC: 34.1 g/dL (ref 30.0–36.0)
MCV: 94.2 fL (ref 80.0–100.0)
Platelets: 101 10*3/uL — ABNORMAL LOW (ref 150–400)
RBC: 3.11 MIL/uL — ABNORMAL LOW (ref 4.22–5.81)
RDW: 15.3 % (ref 11.5–15.5)
WBC: 9.9 10*3/uL (ref 4.0–10.5)
nRBC: 0.2 % (ref 0.0–0.2)

## 2020-12-14 LAB — PHOSPHORUS: Phosphorus: 3.1 mg/dL (ref 2.5–4.6)

## 2020-12-14 LAB — MAGNESIUM: Magnesium: 2.6 mg/dL — ABNORMAL HIGH (ref 1.7–2.4)

## 2020-12-14 MED ORDER — PANTOPRAZOLE SODIUM 40 MG PO PACK
40.0000 mg | PACK | Freq: Every day | ORAL | Status: DC
  Administered 2020-12-14 – 2020-12-25 (×12): 40 mg
  Filled 2020-12-14 (×14): qty 20

## 2020-12-14 MED ORDER — DEXMEDETOMIDINE HCL IN NACL 400 MCG/100ML IV SOLN
0.4000 ug/kg/h | INTRAVENOUS | Status: DC
  Administered 2020-12-14: 1 ug/kg/h via INTRAVENOUS
  Administered 2020-12-14: 0.4 ug/kg/h via INTRAVENOUS
  Administered 2020-12-14: 0.8 ug/kg/h via INTRAVENOUS
  Administered 2020-12-14 – 2020-12-15 (×3): 1 ug/kg/h via INTRAVENOUS
  Administered 2020-12-15: 1.2 ug/kg/h via INTRAVENOUS
  Administered 2020-12-15: 0.6 ug/kg/h via INTRAVENOUS
  Administered 2020-12-15 – 2020-12-16 (×7): 1.2 ug/kg/h via INTRAVENOUS
  Administered 2020-12-16 (×2): 0.8 ug/kg/h via INTRAVENOUS
  Administered 2020-12-16: 1.2 ug/kg/h via INTRAVENOUS
  Administered 2020-12-16: 0.8 ug/kg/h via INTRAVENOUS
  Administered 2020-12-17 (×2): 1.8 ug/kg/h via INTRAVENOUS
  Administered 2020-12-17: 1.2 ug/kg/h via INTRAVENOUS
  Administered 2020-12-17: 1.8 ug/kg/h via INTRAVENOUS
  Administered 2020-12-17: 1.3 ug/kg/h via INTRAVENOUS
  Administered 2020-12-17: 1.8 ug/kg/h via INTRAVENOUS
  Administered 2020-12-17: 1.4 ug/kg/h via INTRAVENOUS
  Administered 2020-12-17: 0.6 ug/kg/h via INTRAVENOUS
  Administered 2020-12-17: 1.8 ug/kg/h via INTRAVENOUS
  Administered 2020-12-17: 1.2 ug/kg/h via INTRAVENOUS
  Administered 2020-12-17: 1.8 ug/kg/h via INTRAVENOUS
  Administered 2020-12-17: 1.2 ug/kg/h via INTRAVENOUS
  Administered 2020-12-18: 1.6 ug/kg/h via INTRAVENOUS
  Administered 2020-12-18: 1.4 ug/kg/h via INTRAVENOUS
  Administered 2020-12-18 (×2): 1.5 ug/kg/h via INTRAVENOUS
  Administered 2020-12-18 (×2): 1.8 ug/kg/h via INTRAVENOUS
  Administered 2020-12-18: 1.6 ug/kg/h via INTRAVENOUS
  Administered 2020-12-18 (×2): 1.8 ug/kg/h via INTRAVENOUS
  Administered 2020-12-18: 1.7 ug/kg/h via INTRAVENOUS
  Administered 2020-12-18: 1.6 ug/kg/h via INTRAVENOUS
  Administered 2020-12-18 (×2): 1.8 ug/kg/h via INTRAVENOUS
  Administered 2020-12-18: 1.4 ug/kg/h via INTRAVENOUS
  Administered 2020-12-19: 1 ug/kg/h via INTRAVENOUS
  Administered 2020-12-19: 1.7 ug/kg/h via INTRAVENOUS
  Administered 2020-12-19: 1.599 ug/kg/h via INTRAVENOUS
  Administered 2020-12-19: 1.7 ug/kg/h via INTRAVENOUS
  Administered 2020-12-19: 1.001 ug/kg/h via INTRAVENOUS
  Administered 2020-12-19: 1 ug/kg/h via INTRAVENOUS
  Administered 2020-12-19: 1.7 ug/kg/h via INTRAVENOUS
  Administered 2020-12-19: 1.6 ug/kg/h via INTRAVENOUS
  Administered 2020-12-19: 1 ug/kg/h via INTRAVENOUS
  Administered 2020-12-19: 1.6 ug/kg/h via INTRAVENOUS
  Administered 2020-12-19: 1.8 ug/kg/h via INTRAVENOUS
  Administered 2020-12-20: 0.799 ug/kg/h via INTRAVENOUS
  Administered 2020-12-20: 1.001 ug/kg/h via INTRAVENOUS
  Administered 2020-12-20: 1 ug/kg/h via INTRAVENOUS
  Administered 2020-12-20: 0.799 ug/kg/h via INTRAVENOUS
  Administered 2020-12-20: 1 ug/kg/h via INTRAVENOUS
  Administered 2020-12-20: 0.799 ug/kg/h via INTRAVENOUS
  Administered 2020-12-20 (×2): 1 ug/kg/h via INTRAVENOUS
  Administered 2020-12-21: 0.799 ug/kg/h via INTRAVENOUS
  Administered 2020-12-21: 1.5 ug/kg/h via INTRAVENOUS
  Administered 2020-12-21: 0.799 ug/kg/h via INTRAVENOUS
  Administered 2020-12-21: 1.5 ug/kg/h via INTRAVENOUS
  Administered 2020-12-21: 0.5 ug/kg/h via INTRAVENOUS
  Administered 2020-12-21 (×2): 1 ug/kg/h via INTRAVENOUS
  Administered 2020-12-22: 0.9 ug/kg/h via INTRAVENOUS
  Administered 2020-12-22: 0.5 ug/kg/h via INTRAVENOUS
  Administered 2020-12-22: 1 ug/kg/h via INTRAVENOUS
  Administered 2020-12-22: 0.5 ug/kg/h via INTRAVENOUS
  Administered 2020-12-22: 0.9 ug/kg/h via INTRAVENOUS
  Administered 2020-12-22 (×2): 0.8 ug/kg/h via INTRAVENOUS
  Administered 2020-12-23: 0.6 ug/kg/h via INTRAVENOUS
  Administered 2020-12-23 (×3): 0.9 ug/kg/h via INTRAVENOUS
  Administered 2020-12-23: 1 ug/kg/h via INTRAVENOUS
  Administered 2020-12-23 (×2): 0.9 ug/kg/h via INTRAVENOUS
  Administered 2020-12-24: 0.5 ug/kg/h via INTRAVENOUS
  Administered 2020-12-24: 0.601 ug/kg/h via INTRAVENOUS
  Administered 2020-12-24 – 2020-12-25 (×3): 0.4 ug/kg/h via INTRAVENOUS
  Administered 2020-12-25: 0.6 ug/kg/h via INTRAVENOUS
  Administered 2020-12-25: 0.7 ug/kg/h via INTRAVENOUS
  Administered 2020-12-25: 0.6 ug/kg/h via INTRAVENOUS
  Administered 2020-12-26: 0.7 ug/kg/h via INTRAVENOUS
  Administered 2020-12-26: 1.8 ug/kg/h via INTRAVENOUS
  Administered 2020-12-26: 1.7 ug/kg/h via INTRAVENOUS
  Administered 2020-12-26: 1.8 ug/kg/h via INTRAVENOUS
  Administered 2020-12-26: 1.2 ug/kg/h via INTRAVENOUS
  Administered 2020-12-26: 1.8 ug/kg/h via INTRAVENOUS
  Administered 2020-12-26: 0.7 ug/kg/h via INTRAVENOUS
  Administered 2020-12-26: 1.8 ug/kg/h via INTRAVENOUS
  Administered 2020-12-26: 0.6 ug/kg/h via INTRAVENOUS
  Administered 2020-12-26: 1.4 ug/kg/h via INTRAVENOUS
  Administered 2020-12-26 – 2020-12-27 (×6): 1.8 ug/kg/h via INTRAVENOUS
  Filled 2020-12-14 (×42): qty 100
  Filled 2020-12-14: qty 200
  Filled 2020-12-14 (×7): qty 100
  Filled 2020-12-14: qty 200
  Filled 2020-12-14 (×4): qty 100
  Filled 2020-12-14: qty 200
  Filled 2020-12-14 (×9): qty 100
  Filled 2020-12-14: qty 200
  Filled 2020-12-14 (×2): qty 100
  Filled 2020-12-14 (×2): qty 200
  Filled 2020-12-14 (×6): qty 100
  Filled 2020-12-14: qty 200
  Filled 2020-12-14 (×5): qty 100
  Filled 2020-12-14: qty 200
  Filled 2020-12-14 (×4): qty 100
  Filled 2020-12-14: qty 200
  Filled 2020-12-14 (×16): qty 100

## 2020-12-14 MED ORDER — HEPARIN SODIUM (PORCINE) 5000 UNIT/ML IJ SOLN
5000.0000 [IU] | Freq: Three times a day (TID) | INTRAMUSCULAR | Status: DC
  Administered 2020-12-14: 5000 [IU] via SUBCUTANEOUS
  Filled 2020-12-14: qty 1

## 2020-12-14 MED ORDER — FUROSEMIDE 10 MG/ML IJ SOLN
40.0000 mg | Freq: Four times a day (QID) | INTRAMUSCULAR | Status: AC
  Administered 2020-12-14 (×2): 40 mg via INTRAVENOUS
  Filled 2020-12-14 (×2): qty 4

## 2020-12-14 MED ORDER — CEFAZOLIN SODIUM-DEXTROSE 2-4 GM/100ML-% IV SOLN
2.0000 g | Freq: Three times a day (TID) | INTRAVENOUS | Status: DC
  Administered 2020-12-14 – 2020-12-16 (×6): 2 g via INTRAVENOUS
  Filled 2020-12-14 (×7): qty 100

## 2020-12-14 MED ORDER — POTASSIUM CHLORIDE 20 MEQ PO PACK: 40.0000 meq | PACK | Freq: Two times a day (BID) | ORAL | Status: DC

## 2020-12-14 MED ORDER — METOLAZONE 2.5 MG PO TABS
2.5000 mg | ORAL_TABLET | Freq: Once | ORAL | Status: AC
  Administered 2020-12-14: 2.5 mg
  Filled 2020-12-14: qty 1

## 2020-12-14 MED ORDER — SODIUM CHLORIDE 0.9 % IR SOLN: 3000.0000 mL | Status: DC

## 2020-12-14 MED ORDER — POTASSIUM CHLORIDE 20 MEQ PO PACK
40.0000 meq | PACK | Freq: Two times a day (BID) | ORAL | Status: AC
  Administered 2020-12-14 (×2): 40 meq
  Filled 2020-12-14 (×2): qty 2

## 2020-12-14 MED ORDER — ALTEPLASE 2 MG IJ SOLR
2.0000 mg | Freq: Once | INTRAMUSCULAR | Status: DC
  Filled 2020-12-14: qty 2

## 2020-12-14 MED ORDER — ALBUMIN HUMAN 25 % IV SOLN: 25.0000 g | Freq: Four times a day (QID) | INTRAVENOUS | Status: DC

## 2020-12-14 MED ORDER — METOLAZONE 2.5 MG PO TABS: 2.5000 mg | ORAL_TABLET | Freq: Once | ORAL | Status: DC

## 2020-12-14 MED ORDER — ALBUMIN HUMAN 25 % IV SOLN
25.0000 g | Freq: Four times a day (QID) | INTRAVENOUS | Status: AC
  Administered 2020-12-14 (×2): 25 g via INTRAVENOUS
  Filled 2020-12-14 (×2): qty 100

## 2020-12-14 NOTE — Progress Notes (Signed)
Patient seems to have pulled on his foley Now bloody urine output Attempted flush which broke seal To try taking foley out and condom cath Once foley out started bleeding more Would place coude catheter and start CBI Hold heparin  Myrla Halsted MD PCCM

## 2020-12-14 NOTE — Progress Notes (Signed)
IVT consult placed to check central line with no blood return. Ports flush easily but have no blood return. CXR on 4/3 shows kink in catheter and questionable tip position placement. Spoke with bedside RN about CXR findings and asked for MD to review.   Penni Bombard Evolet Salminen,RN-VAST

## 2020-12-14 NOTE — Progress Notes (Addendum)
Bleeding no longer observed in Foley bag. Urine is visibly clear and yellow. Dr. Warrick Parisian ordered to hold off on bladder irrigation for now. Will continue to monitor closely throughout shift.

## 2020-12-14 NOTE — Progress Notes (Signed)
Not able to draw morning labs from [facility] due to none of the three lumens drawing back. IV team at bedside to attempt to "declot" the line. Lab tech called to come and draw morning labs.

## 2020-12-14 NOTE — Progress Notes (Signed)
eLink Physician-Brief Progress Note Patient Name: [name] DOB: [birth year] MRN: [id number]   Date of Service  12/14/2020  HPI/Events of Note  Patient had bloody urine earlier today from Foley catheter related trauma, and bladder was placed under continuous irrigation, bleeding has stopped and urine is clear.  eICU Interventions  Bedside RN instructed to discontinue irrigation.        Thomasene Lot Hilary Milks 12/14/2020, 11:28 PM

## 2020-12-14 NOTE — Consult Note (Signed)
I have been asked to see the patient by Dr. Levon Hedgeraniel Smith, for evaluation and management of gross hematuria following traumatic foley removal.  History of present illness: 60 year old man currently admitted to the ICU for septic shock secondary to E. coli UTI with bacteremia.  Patient had traumatic Foley removal today when he pulled out catheter.  He is since had gross blood per urethra.   Review of systems: Unable to peform, pt intubated and sedated  Patient Active Problem List   Diagnosis Date Noted  . Acute respiratory failure (HCC) 12/10/2020  . Sepsis with acute renal failure and septic shock (HCC)     No current facility-administered medications on file prior to encounter.   Current Outpatient Medications on File Prior to Encounter  Medication Sig Dispense Refill  . atorvastatin (LIPITOR) 40 MG tablet Take 40 mg by mouth daily.    Marland Kitchen. glipiZIDE (GLUCOTROL) 10 MG tablet Take 10 mg by mouth every morning.    Marland Kitchen. oxyCODONE (OXYCONTIN) 10 mg 12 hr tablet Take 10 mg by mouth in the morning and at bedtime.    Marland Kitchen. oxymetazoline (AFRIN) 0.05 % nasal spray Place 1 spray into both nostrils 2 (two) times daily as needed for congestion.    Marland Kitchen. rOPINIRole (REQUIP) 1 MG tablet Take 1 mg by mouth in the morning and at bedtime.          No family history on file.  PE: Vitals:   12/14/20 1400 12/14/20 1415 12/14/20 1501 12/14/20 1545  BP:  (!) 91/54  90/65  Pulse: (!) 108 86  90  Resp: (!) 21 18  19   Temp:   (!) 102.3 F (39.1 C)   TempSrc:   Bladder   SpO2: 95% 97%  99%  Weight:      Height:       Pt intubated on ventilator and sedated Regular sinus rhythm/rate Abdomen is soft, nontender, nondistended GU: uncirc phallus, blood clot at meatus Grossly neurologically intact No identifiable skin lesions  Recent Labs    12/12/20 0446 12/13/20 0400 12/14/20 0611  WBC 6.5 9.1 9.9  HGB 10.9* 11.1* 10.0*  HCT 32.4* 33.2* 29.3*   Recent Labs    12/12/20 0446 12/13/20 0400  12/14/20 0611  NA 132* 140 144  K 3.9 3.7 3.7  CL 103 110 113*  CO2 19* 22 23  GLUCOSE 262* 193* 144*  BUN 85* 81* 97*  CREATININE 3.89* 2.82* 2.73*  CALCIUM 7.6* 8.3* 8.3*   No results for input(s): LABPT, INR in the last 72 hours. No results for input(s): LABURIN in the last 72 hours. Results for orders placed or performed during the hospital encounter of 12/10/20  Culture, blood (routine x 2)     Status: Abnormal   Collection Time: 12/10/20  3:50 AM   Specimen: BLOOD  Result Value Ref Range Status   Specimen Description BLOOD LEFT ANTECUBITAL  Final   Special Requests   Final    BOTTLES DRAWN AEROBIC AND ANAEROBIC Blood Culture adequate volume   Culture  Setup Time   Final    GRAM NEGATIVE RODS IN BOTH AEROBIC AND ANAEROBIC BOTTLES CRITICAL RESULT CALLED TO, READ BACK BY AND VERIFIED WITHKandace Parkins: J FRIENS [facility] [id number] 12/10/20 A BROWNING Performed at [facility], [address] [place], [place] [address]    Culture ESCHERICHIA COLI (A)  Final   Report Status 12/12/2020 FINAL  Final   Organism ID, Bacteria ESCHERICHIA COLI  Final      Susceptibility  Escherichia coli - MIC*    AMPICILLIN 8 SENSITIVE Sensitive     CEFAZOLIN <=4 SENSITIVE Sensitive     CEFEPIME <=0.12 SENSITIVE Sensitive     CEFTAZIDIME <=1 SENSITIVE Sensitive     CEFTRIAXONE <=0.25 SENSITIVE Sensitive     CIPROFLOXACIN <=0.25 SENSITIVE Sensitive     GENTAMICIN <=1 SENSITIVE Sensitive     IMIPENEM <=0.25 SENSITIVE Sensitive     TRIMETH/SULFA <=20 SENSITIVE Sensitive     AMPICILLIN/SULBACTAM <=2 SENSITIVE Sensitive     PIP/TAZO <=4 SENSITIVE Sensitive     * ESCHERICHIA COLI  Blood Culture ID Panel (Reflexed)     Status: Abnormal   Collection Time: 12/10/20  3:50 AM  Result Value Ref Range Status   Enterococcus faecalis NOT DETECTED NOT DETECTED Final   Enterococcus Faecium NOT DETECTED NOT DETECTED Final   Listeria monocytogenes NOT DETECTED NOT DETECTED Final   Staphylococcus species NOT DETECTED NOT  DETECTED Final   Staphylococcus aureus (BCID) NOT DETECTED NOT DETECTED Final   Staphylococcus epidermidis NOT DETECTED NOT DETECTED Final   Staphylococcus lugdunensis NOT DETECTED NOT DETECTED Final   Streptococcus species NOT DETECTED NOT DETECTED Final   Streptococcus agalactiae NOT DETECTED NOT DETECTED Final   Streptococcus pneumoniae NOT DETECTED NOT DETECTED Final   Streptococcus pyogenes NOT DETECTED NOT DETECTED Final   A.calcoaceticus-baumannii NOT DETECTED NOT DETECTED Final   Bacteroides fragilis NOT DETECTED NOT DETECTED Final   Enterobacterales DETECTED (A) NOT DETECTED Final    Comment: Enterobacterales represent a large order of gram negative bacteria, not a single organism. CRITICAL RESULT CALLED TO, READ BACK BY AND VERIFIED WITH: Kandace Parkins PHARMD [id number] 12/10/20 A BROWNING    Enterobacter cloacae complex NOT DETECTED NOT DETECTED Final   Escherichia coli DETECTED (A) NOT DETECTED Final    Comment: CRITICAL RESULT CALLED TO, READ BACK BY AND VERIFIED WITH: Kandace Parkins PHARMD [id number] 12/10/20 A BROWNING    Klebsiella aerogenes NOT DETECTED NOT DETECTED Final   Klebsiella oxytoca NOT DETECTED NOT DETECTED Final   Klebsiella pneumoniae NOT DETECTED NOT DETECTED Final   Proteus species NOT DETECTED NOT DETECTED Final   Salmonella species NOT DETECTED NOT DETECTED Final   Serratia marcescens NOT DETECTED NOT DETECTED Final   Haemophilus influenzae NOT DETECTED NOT DETECTED Final   Neisseria meningitidis NOT DETECTED NOT DETECTED Final   Pseudomonas aeruginosa NOT DETECTED NOT DETECTED Final   Stenotrophomonas maltophilia NOT DETECTED NOT DETECTED Final   Candida albicans NOT DETECTED NOT DETECTED Final   Candida auris NOT DETECTED NOT DETECTED Final   Candida glabrata NOT DETECTED NOT DETECTED Final   Candida krusei NOT DETECTED NOT DETECTED Final   Candida parapsilosis NOT DETECTED NOT DETECTED Final   Candida tropicalis NOT DETECTED NOT DETECTED Final   Cryptococcus  neoformans/gattii NOT DETECTED NOT DETECTED Final   CTX-M ESBL NOT DETECTED NOT DETECTED Final   Carbapenem resistance IMP NOT DETECTED NOT DETECTED Final   Carbapenem resistance KPC NOT DETECTED NOT DETECTED Final   Carbapenem resistance NDM NOT DETECTED NOT DETECTED Final   Carbapenem resist OXA 48 LIKE NOT DETECTED NOT DETECTED Final   Carbapenem resistance VIM NOT DETECTED NOT DETECTED Final    Comment: Performed at [facility], [address], [place], [place] [address]  Resp Panel by RT-PCR (Flu A&B, Covid) Nasopharyngeal Swab     Status: None   Collection Time: 12/10/20  3:59 AM   Specimen: Nasopharyngeal Swab; Nasopharyngeal(NP) swabs in vial transport medium  Result Value Ref Range Status  SARS Coronavirus 2 by RT PCR NEGATIVE NEGATIVE Final    Comment: (NOTE) SARS-CoV-2 target nucleic acids are NOT DETECTED.  The SARS-CoV-2 RNA is generally detectable in upper respiratory specimens during the acute phase of infection. The lowest concentration of SARS-CoV-2 viral copies this assay can detect is 138 copies/mL. A negative result does not preclude SARS-Cov-2 infection and should not be used as the sole basis for treatment or other patient management decisions. A negative result may occur with  improper specimen collection/handling, submission of specimen other than nasopharyngeal swab, presence of viral mutation(s) within the areas targeted by this assay, and inadequate number of viral copies(<138 copies/mL). A negative result must be combined with clinical observations, patient history, and epidemiological information. The expected result is Negative.  Fact Sheet for Patients:  BloggerCourse.com  Fact Sheet for Healthcare Providers:  SeriousBroker.it  This test is no t yet approved or cleared by the [place] FDA and  has been authorized for detection and/or diagnosis of SARS-CoV-2 by FDA under an Emergency Use  Authorization (EUA). This EUA will remain  in effect (meaning this test can be used) for the duration of the COVID-19 declaration under Section 564(b)(1) of the Act, 21 U.S.C.section 360bbb-3(b)(1), unless the authorization is terminated  or revoked sooner.       Influenza A by PCR NEGATIVE NEGATIVE Final   Influenza B by PCR NEGATIVE NEGATIVE Final    Comment: (NOTE) The Xpert Xpress SARS-CoV-2/FLU/RSV plus assay is intended as an aid in the diagnosis of influenza from Nasopharyngeal swab specimens and should not be used as a sole basis for treatment. Nasal washings and aspirates are unacceptable for Xpert Xpress SARS-CoV-2/FLU/RSV testing.  Fact Sheet for Patients: BloggerCourse.com  Fact Sheet for Healthcare Providers: SeriousBroker.it  This test is not yet approved or cleared by the [place] FDA and has been authorized for detection and/or diagnosis of SARS-CoV-2 by FDA under an Emergency Use Authorization (EUA). This EUA will remain in effect (meaning this test can be used) for the duration of the COVID-19 declaration under Section 564(b)(1) of the Act, 21 U.S.C. section 360bbb-3(b)(1), unless the authorization is terminated or revoked.  Performed at [facility], [address], [place], [place] [address]   Culture, blood (routine x 2)     Status: Abnormal   Collection Time: 12/10/20  4:06 AM   Specimen: BLOOD RIGHT HAND  Result Value Ref Range Status   Specimen Description BLOOD RIGHT HAND  Final   Special Requests   Final    BOTTLES DRAWN AEROBIC AND ANAEROBIC Blood Culture results may not be optimal due to an inadequate volume of blood received in culture bottles   Culture  Setup Time   Final    GRAM NEGATIVE RODS IN BOTH AEROBIC AND ANAEROBIC BOTTLES IDENTIFICATION TO FOLLOW CRITICAL RESULT CALLED TO, READ BACK BY AND VERIFIED WITHKandace Parkins PHARMD [id number] 12/10/20 A BROWNING    Culture (A)  Final     ESCHERICHIA COLI SUSCEPTIBILITIES PERFORMED ON PREVIOUS CULTURE WITHIN THE LAST 5 DAYS. Performed at [facility], [address], [place], [place] [address]    Report Status 12/12/2020 FINAL  Final  Urine culture     Status: Abnormal   Collection Time: 12/10/20  4:40 AM   Specimen: Urine, Clean Catch  Result Value Ref Range Status   Specimen Description URINE, CLEAN CATCH  Final   Special Requests   Final    NONE Performed at [facility], [address] [place], [place] [address]    Culture >=100,000 COLONIES/mL ESCHERICHIA COLI (A)  Final   Report Status 12/12/2020 FINAL  Final   Organism ID, Bacteria ESCHERICHIA COLI (A)  Final      Susceptibility   Escherichia coli - MIC*    AMPICILLIN 8 SENSITIVE Sensitive     CEFAZOLIN <=4 SENSITIVE Sensitive     CEFEPIME <=0.12 SENSITIVE Sensitive     CEFTRIAXONE <=0.25 SENSITIVE Sensitive     CIPROFLOXACIN <=0.25 SENSITIVE Sensitive     GENTAMICIN <=1 SENSITIVE Sensitive     IMIPENEM <=0.25 SENSITIVE Sensitive     NITROFURANTOIN <=16 SENSITIVE Sensitive     TRIMETH/SULFA <=20 SENSITIVE Sensitive     AMPICILLIN/SULBACTAM <=2 SENSITIVE Sensitive     PIP/TAZO <=4 SENSITIVE Sensitive     * >=100,000 COLONIES/mL ESCHERICHIA COLI  MRSA PCR Screening     Status: None   Collection Time: 12/10/20 11:24 AM   Specimen: Nasal Mucosa; Nasopharyngeal  Result Value Ref Range Status   MRSA by PCR NEGATIVE NEGATIVE Final    Comment:        The GeneXpert MRSA Assay (FDA approved for NASAL specimens only), is one component of a comprehensive MRSA colonization surveillance program. It is not intended to diagnose MRSA infection nor to guide or monitor treatment for MRSA infections. Performed at [facility], [address], [place], [place] [address]     Imaging: Renal US 12/11/20 IMPRESSION: No acute findings.  No hydronephrosis.   Electronically Signed   By: Charlett Nose M.D.   On: 12/11/2020 19:29  Imp: 60 yo man  admitted with E colic UTI and bacteremia currently intubated in ICU with traumatic foley removal.   Recommendations: -22Fr coude foley placed without difficulty and irrigated easily by Urology under sterile conditions -1 small clot removed and drainage of light yellow urine present -continue foley catheter to allow prostate trauma to heal x 1 week -void trial per primary team -contact urology with questions or concerns  Thank you for involving me in this patient's care. Please page with any further questions or concerns. Primitivo Merkey D Garrus Gauthreaux

## 2020-12-14 NOTE — Progress Notes (Signed)
NAME:  [name], MRN:  [id number], DOB:  1960/12/26, LOS: 4 ADMISSION DATE:  12/10/2020,   History of Present Illness:  This is a 60 year old white male who presented to the emergency room from home.  The patient's wife explains that the patient's had severe diarrhea that started approximately 4 to 5 days ago.  He had also had vomiting that started approximately 24 hours after the diarrhea.  Over the past 24 hours patient has become more confused and at times belligerent.  Patient's wife states that she noticed he became very cold to touch.  EMS was called patient was brought to the emergency room for further evaluation.  While in the emergency room patient found to be hypotensive, combative, confused, and was intubated.  He was noted to have mottling over his trunk and extremities.  Pertinent  Medical History  Prior smoker quit 15 years ago 82-100-pack-year history Sinusitis Chronic pain syndrome Hypertension Hyperlipidemia Chronic back pain  Significant Hospital Events: Including procedures, antibiotic start and stop dates in addition to other pertinent events   . 12/10/2020 patient was intubated . 12/10/2020 triple-lumen catheter inserted . 12/11/2020 off pressors. E coli bacteremia, e coli uti. Some improvement in pulm opacities on CXR. On rocephin   Interim History / Subjective:  On vent. Weaning sedation, following some commands, endorses pain.   Objective   Blood pressure 95/61, pulse 76, temperature 99.9 F (37.7 C), temperature source Core, resp. rate (!) 26, height 6\' 2"  (1.88 m), weight (!) 147.1 kg, SpO2 99 %.    Vent Mode: PRVC FiO2 (%):  [40 %] 40 % Set Rate:  [26 bmp] 26 bmp Vt Set:  mL] 660 mL PEEP:  [8 cmH20] 8 cmH20 Plateau Pressure:  [25 cmH20] 25 cmH20   Intake/Output Summary (Last 24 hours) at 12/14/2020 0838 Last data filed at 12/14/2020 0600 Gross per 24 hour  Intake 3206.59 ml  Output 5545 ml  Net -2338.41 ml   Filed Weights   12/12/20 0500  12/13/20 0251 12/14/20 0337  Weight: (!) 153.7 kg (!) 154.4 kg (!) 147.1 kg    Examination:  Constitutional: ill appearing man on vent  Eyes: pupils equal, reactive Ears, nose, mouth, and throat: ETT in place, minimal secretions Cardiovascular: RRR, ext warm Respiratory: maintaining on PS, CTA Gastrointestinal: more soft, +BS Skin: No rashes, normal turgor Neurologic: following some commands, nodding to questions    Labs/imaging that I havepersonally reviewed  (right click and "Reselect all SmartList Selections" daily)  Cr improving K a bit low  Plts improving CXR pulmonary edema  Resolved Hospital Problem list   Sepsis-associated DIC   Assessment & Plan:  Acute hypercarbic respiratory failure inability to compensate for increased metabolic demand; question aspiration pneumonitis Septic ATN- continuing to improve - cont. Diuresis lasix 40 mg q6h, metolazone 2.5 mg, limited by renal function, negative 2.3L last 24 hours, still net positive 3.5L  - replete K - wean to PS, possible extubation today or tomorrow - Vent support, VAP prevention bundle - triglycerides elevated, no baseline, f/u tomorrow. Weaning propofol and fentanyl  Septic shock due to E coli UTI with bacteremia- renal 02/13/21 benign. Still spiking fevers but improving.  - Continue rocephin day 5/10  Sepsis-associated DIC  Resolved. Start VTE prophylaxis.   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   DM2 with hyperglycemia - continue levemir 30U; cont. TF coverage 8U and SSI  Stress cardiomyopathy- w/ type 2 NSTEMI, would benefit from repeating  as OP to make sure not masking ischemic heart disease  Constipation- improved, dc sorbitol  Best practice (right click and "Reselect all SmartList Selections" daily)  Diet:  Tube Feed  Pain/Anxiety/Delirium protocol (if indicated): Yes (RASS goal -1) VAP protocol (if indicated): Yes DVT prophylaxis: start VTE ppx GI prophylaxis:  PPI Glucose control:  See above Central venous access:  Yes, and it is still needed Arterial line:  N/A Foley:  Yes, and it is still needed Mobility:  bed rest  PT consulted: N/A Last date of multidisciplinary goals of care discussion -- wife updated 4/4 Code Status:  full code Disposition: ICU   Carmella Kees A, DO 12/14/2020, 8:40 AM Pager: [phone]

## 2020-12-15 ENCOUNTER — Inpatient Hospital Stay (HOSPITAL_COMMUNITY): Payer: Medicaid Other

## 2020-12-15 DIAGNOSIS — R609 Edema, unspecified: Secondary | ICD-10-CM

## 2020-12-15 LAB — COMPREHENSIVE METABOLIC PANEL
ALT: 69 U/L — ABNORMAL HIGH (ref 0–44)
AST: 64 U/L — ABNORMAL HIGH (ref 15–41)
Albumin: 2.5 g/dL — ABNORMAL LOW (ref 3.5–5.0)
Alkaline Phosphatase: 117 U/L (ref 38–126)
Anion gap: 10 (ref 5–15)
BUN: 100 mg/dL — ABNORMAL HIGH (ref 6–20)
CO2: 22 mmol/L (ref 22–32)
Calcium: 8.4 mg/dL — ABNORMAL LOW (ref 8.9–10.3)
Chloride: 116 mmol/L — ABNORMAL HIGH (ref 98–111)
Creatinine, Ser: 2.67 mg/dL — ABNORMAL HIGH (ref 0.61–1.24)
GFR, Estimated: 27 mL/min — ABNORMAL LOW (ref >60)
Glucose, Bld: 113 mg/dL — ABNORMAL HIGH (ref 70–99)
Potassium: 4.3 mmol/L (ref 3.5–5.1)
Sodium: 148 mmol/L — ABNORMAL HIGH (ref 135–145)
Total Bilirubin: 1.3 mg/dL — ABNORMAL HIGH (ref 0.3–1.2)
Total Protein: 5.8 g/dL — ABNORMAL LOW (ref 6.5–8.1)

## 2020-12-15 LAB — POCT I-STAT 7, (LYTES, BLD GAS, ICA,H+H)
Acid-base deficit: 1 mmol/L (ref 0.0–2.0)
Bicarbonate: 24.8 mmol/L (ref 20.0–28.0)
Calcium, Ion: 1.19 mmol/L (ref 1.15–1.40)
HCT: 29 % — ABNORMAL LOW (ref 39.0–52.0)
Hemoglobin: 9.9 g/dL — ABNORMAL LOW (ref 13.0–17.0)
O2 Saturation: 99 %
Patient temperature: 103.3
Potassium: 4.1 mmol/L (ref 3.5–5.1)
Sodium: 151 mmol/L — ABNORMAL HIGH (ref 135–145)
TCO2: 26 mmol/L (ref 22–32)
pCO2 arterial: 52.4 mmHg — ABNORMAL HIGH (ref 32.0–48.0)
pH, Arterial: 7.296 — ABNORMAL LOW (ref 7.350–7.450)
pO2, Arterial: 173 mmHg — ABNORMAL HIGH (ref 83.0–108.0)

## 2020-12-15 LAB — GLUCOSE, CAPILLARY
Glucose-Capillary: 139 mg/dL — ABNORMAL HIGH (ref 70–99)
Glucose-Capillary: 170 mg/dL — ABNORMAL HIGH (ref 70–99)
Glucose-Capillary: 118 mg/dL — ABNORMAL HIGH (ref 70–99)
Glucose-Capillary: 133 mg/dL — ABNORMAL HIGH (ref 70–99)
Glucose-Capillary: 140 mg/dL — ABNORMAL HIGH (ref 70–99)
Glucose-Capillary: 190 mg/dL — ABNORMAL HIGH (ref 70–99)

## 2020-12-15 LAB — CBC
HCT: 30.2 % — ABNORMAL LOW (ref 39.0–52.0)
Hemoglobin: 10 g/dL — ABNORMAL LOW (ref 13.0–17.0)
MCH: 32.1 pg (ref 26.0–34.0)
MCHC: 33.1 g/dL (ref 30.0–36.0)
MCV: 96.8 fL (ref 80.0–100.0)
Platelets: 135 10*3/uL — ABNORMAL LOW (ref 150–400)
RBC: 3.12 MIL/uL — ABNORMAL LOW (ref 4.22–5.81)
RDW: 15.9 % — ABNORMAL HIGH (ref 11.5–15.5)
WBC: 11.5 10*3/uL — ABNORMAL HIGH (ref 4.0–10.5)
nRBC: 0 % (ref 0.0–0.2)

## 2020-12-15 LAB — PHOSPHORUS: Phosphorus: 4.3 mg/dL (ref 2.5–4.6)

## 2020-12-15 LAB — MAGNESIUM: Magnesium: 2.4 mg/dL (ref 1.7–2.4)

## 2020-12-15 LAB — PROCALCITONIN: Procalcitonin: 22.73 ng/mL

## 2020-12-15 LAB — TRIGLYCERIDES: Triglycerides: 269 mg/dL — ABNORMAL HIGH (ref <150)

## 2020-12-15 MED ORDER — ROCURONIUM BROMIDE 10 MG/ML (PF) SYRINGE: 100.0000 mg | PREFILLED_SYRINGE | Freq: Once | INTRAVENOUS | Status: AC

## 2020-12-15 MED ORDER — ROCURONIUM BROMIDE 10 MG/ML (PF) SYRINGE
PREFILLED_SYRINGE | INTRAVENOUS | Status: AC
  Administered 2020-12-15: 100 mg via INTRAVENOUS
  Filled 2020-12-15: qty 10

## 2020-12-15 MED ORDER — HEPARIN SODIUM (PORCINE) 5000 UNIT/ML IJ SOLN
5000.0000 [IU] | Freq: Three times a day (TID) | INTRAMUSCULAR | Status: DC
  Administered 2020-12-15 – 2020-12-25 (×31): 5000 [IU] via SUBCUTANEOUS
  Filled 2020-12-15 (×31): qty 1

## 2020-12-15 MED ORDER — FENTANYL CITRATE (PF) 100 MCG/2ML IJ SOLN
INTRAMUSCULAR | Status: AC
  Administered 2020-12-15: 100 ug via INTRAVENOUS
  Filled 2020-12-15: qty 2

## 2020-12-15 MED ORDER — IOHEXOL 9 MG/ML PO SOLN
ORAL | Status: AC
  Administered 2020-12-15: 500 mL
  Filled 2020-12-15: qty 1000

## 2020-12-15 MED ORDER — FREE WATER
300.0000 mL | Freq: Four times a day (QID) | Status: DC
  Administered 2020-12-15 – 2020-12-17 (×9): 300 mL

## 2020-12-15 MED ORDER — MIDAZOLAM HCL 2 MG/2ML IJ SOLN
2.0000 mg | Freq: Once | INTRAMUSCULAR | Status: AC
  Administered 2020-12-15: 2 mg via INTRAVENOUS

## 2020-12-15 MED ORDER — FENTANYL CITRATE (PF) 100 MCG/2ML IJ SOLN: 100.0000 ug | Freq: Once | INTRAMUSCULAR | Status: AC

## 2020-12-15 MED ORDER — ETOMIDATE 2 MG/ML IV SOLN: 20.0000 mg | Freq: Once | INTRAVENOUS | Status: AC

## 2020-12-15 MED ORDER — OXYCODONE HCL 5 MG/5ML PO SOLN
5.0000 mg | Freq: Four times a day (QID) | ORAL | Status: DC
  Administered 2020-12-15 – 2020-12-17 (×8): 5 mg
  Filled 2020-12-15 (×8): qty 5

## 2020-12-15 MED ORDER — METOLAZONE 2.5 MG PO TABS
2.5000 mg | ORAL_TABLET | Freq: Once | ORAL | Status: AC
  Administered 2020-12-15: 2.5 mg via ORAL
  Filled 2020-12-15: qty 1

## 2020-12-15 MED ORDER — ETOMIDATE 2 MG/ML IV SOLN
INTRAVENOUS | Status: AC
  Administered 2020-12-15: 20 mg via INTRAVENOUS
  Filled 2020-12-15: qty 20

## 2020-12-15 NOTE — Progress Notes (Signed)
Patient transported on vent to CT and returned to 2M10 without complications.

## 2020-12-15 NOTE — CV Procedure (Signed)
BLE venous duplex completed.  Results can be found under chart review under CV PROC. 12/15/2020 2:08 PM Charae Depaolis RVT, RDMS

## 2020-12-15 NOTE — Progress Notes (Signed)
SLP Cancellation Note  Patient Details Name: [name] MRN: [id number] DOB: [birth year]   Cancelled treatment:       Reason Eval/Treat Not Completed: Patient not medically ready. Pt intubated. Will sign off at this time.    [name], Riley Nearing 12/15/2020, 10:16 AM

## 2020-12-15 NOTE — Progress Notes (Signed)
PT Cancellation Note  Patient Details Name: [name] MRN: [id number] DOB: 06/13/1961   Cancelled Treatment:    Reason Eval/Treat Not Completed: Medical issues which prohibited therapy (Pt failed extubation and was just re-intubated. Will check back tomorrow.)   [name] 12/15/2020, 10:13 AM [name] M,PT Acute Rehab Services [phone] (pager)

## 2020-12-15 NOTE — Progress Notes (Signed)
Patent tolerated extubation poorly with rapid onset increased WOB, wheezing and gurgling breath sounds.  Emergently reintubated with bronch showing extensive mucus plugging.  Needs more time, send sputum for culture, may need trach for ultimate vent wean but will give him a few more days of diuresis if kidneys tolerate.  Myrla Halsted MD PCCM

## 2020-12-15 NOTE — Progress Notes (Signed)
Patient extubated per order and placed on [place] 6L. Patient had audiable wheezing and increased WOB. MD notified. Patient re-intubated and bronched at bedside.

## 2020-12-15 NOTE — Procedures (Signed)
Bronchoscopy Procedure Note  [name]  [id number]  [birth year]  Date:12/15/20  Time:9:24 AM   Provider Performing:Godfrey Tritschler A Aviyon Hocevar   Procedure(s):  Flexible bronchoscopy with bronchial alveolar lavage (78588)  Indication(s) Respiratory failure   Consent Unable to obtain consent due to emergent nature of procedure.  Anesthesia Fentanyl Versed Etomidate Rocuronium    Time Out Verified patient identification, verified procedure, site/side was marked, verified correct patient position, special equipment/implants available, medications/allergies/relevant history reviewed, required imaging and test results available.   Sterile Technique Usual hand hygiene, masks, gowns, and gloves were used   Procedure Description Bronchoscope advanced through endotracheal tube and into airway.  Airways were examined down to subsegmental level with findings noted below.   Following diagnostic evaluation, BAL(s) performed in RLL with normal saline and return of mucous plugging fluid  Findings:  Suctioning of right upper, middle and lower and segmental lobes with mucous plugging in all right lobes. Left lobes clear. BAL performed RLL.    Complications/Tolerance None; patient tolerated the procedure well. Chest X-ray is needed post procedure.   EBL None   Specimen(s) BAL

## 2020-12-15 NOTE — Progress Notes (Signed)
NAME:  [name], MRN:  [id number], DOB:  30-Aug-1961, LOS: 5 ADMISSION DATE:  12/10/2020,   History of Present Illness:  This is a 60 year old white male who presented to the emergency room from home.  The patient's wife explains that the patient's had severe diarrhea that started approximately 4 to 5 days ago.  He had also had vomiting that started approximately 24 hours after the diarrhea.  Over the past 24 hours patient has become more confused and at times belligerent.  Patient's wife states that she noticed he became very cold to touch.  EMS was called patient was brought to the emergency room for further evaluation.  While in the emergency room patient found to be hypotensive, combative, confused, and was intubated.  He was noted to have mottling over his trunk and extremities.  Pertinent  Medical History  Prior smoker quit 15 years ago 82-100-pack-year history Sinusitis Chronic pain syndrome Hypertension Hyperlipidemia Chronic back pain  Significant Hospital Events: Including procedures, antibiotic start and stop dates in addition to other pertinent events   . 12/10/2020 patient was intubated . 12/10/2020 triple-lumen catheter inserted . 12/11/2020 off pressors. E coli bacteremia, e coli uti. Some improvement in pulm opacities on CXR. On rocephin   Interim History / Subjective:  Urethral bleeding stopped after coude placed. He was in PS all night. Net neg 2L  Objective   Blood pressure (!) 96/55, pulse 70, temperature (!) 101.6 F (38.7 C), temperature source Axillary, resp. rate 20, height 6\' 2"  (1.88 m), weight (!) 144.6 kg, SpO2 99 %.    Vent Mode: PSV FiO2 (%):  [40 %] 40 % PEEP:  [8 cmH20] 8 cmH20 Pressure Support:  [10 cmH20-20 cmH20] 10 cmH20 Plateau Pressure:  [20 cmH20-32 cmH20] 20 cmH20   Intake/Output Summary (Last 24 hours) at 12/15/2020 0732 Last data filed at 12/15/2020 0600 Gross per 24 hour  Intake 3243.09 ml  Output 5300 ml  Net -2056.91 ml   Filed  Weights   12/13/20 0251 12/14/20 0337 12/15/20 0240  Weight: (!) 154.4 kg (!) 147.1 kg (!) 144.6 kg    Examination: Constitutional: Obese man sedated on vent  Eyes: pupils pinpoint, not tracking Ears, nose, mouth, and throat: ETT in place, minimal secreitons Cardiovascular: RRR, ext warm Respiratory: diminished bases, triggering vent Gastrointestinal: soft, +BS Skin: No rashes, normal turgor Neurologic: flails ext with sedation wean Psychiatric: RASS -4 at present     Labs/imaging that I havepersonally reviewed  (right click and "Reselect all SmartList Selections" daily)  BUN up Cr stable Na up CO2 remains a bit down for unclear reason ALI improving  Resolved Hospital Problem list     Assessment & Plan:  Acute hypercarbic respiratory failure inability to compensate for increased metabolic demand; question aspiration pneumonitis Septic ATN- improving - Metolazone x 1 today - SAT/SBT on precedex - Start FWF  Septic shock due to E coli UTI with bacteremia- renal 02/14/21 benign - Continue rocephin x 10 days  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 [place] - LFTs up a bit suspect congestive, trend; INR okay  Sepsis-associated DIC - improved  DM2 with hyperglycemia - continue levemir; increase TF coverage  Stress cardiomyopathy- w/ type 2 NSTEMI, would benefit from repeating as OP to make sure not masking ischemic heart disease  Fever spikes- correlated with when he pulled on foley - Monitor, check tracheal aspirate, LE duplex, remove central line if able  Traumatic urethral bleeding after foley manipulation by patient - Self-resolved, foley replaced, monitor  Constipation-  improved, continue PRNs  Best practice (right click and "Reselect all SmartList Selections" daily)  Diet:  Tube Feed  Pain/Anxiety/Delirium protocol (if indicated): Yes (RASS goal -1) VAP protocol (if indicated): Yes DVT prophylaxis: SCD GI prophylaxis: PPI Glucose control:  See above Central venous access:   Yes, and it is still needed Arterial line:  N/A Foley:  Yes, and it is still needed Mobility:  bed rest  PT consulted: N/A Last date of multidisciplinary goals of care discussion -- wife at bedside 12/15/20 Code Status:  full code Disposition: ICU  Patient critically ill due to respiratory failure, renal failure Interventions to address this today ventilator titration Risk of deterioration without these interventions is high  I personally spent 34 minutes providing critical care not including any separately billable procedures  Myrla Halsted MD [facility]  Prefer epic messenger for cross cover needs If after hours, please call E-link

## 2020-12-15 NOTE — Procedures (Signed)
Intubation Procedure Note  Lenard Kampf  [id number]  Jan 25, 1961  Date:12/15/20  Time:9:26 AM   Provider Performing:Devynn Scheff A Montez Morita and Dr. Levon Hedger   Procedure: Intubation (31500)  Indication(s) Respiratory Failure  Consent Unable to obtain consent due to emergent nature of procedure.   Anesthesia Etomidate, Versed, Fentanyl and Rocuronium   Time Out Verified patient identification, verified procedure, site/side was marked, verified correct patient position, special equipment/implants available, medications/allergies/relevant history reviewed, required imaging and test results available.   Sterile Technique Usual hand hygeine, masks, and gloves were used   Procedure Description Patient positioned in bed supine.  Sedation given as noted above.  Patient was intubated with endotracheal tube using Glidescope.  View was Grade 1 full glottis .  Number of attempts was 1.  Colorimetric CO2 detector was consistent with tracheal placement.   Complications/Tolerance None; patient tolerated the procedure well. Chest X-ray is ordered to verify placement.   EBL Minimal   Specimen(s) None

## 2020-12-16 ENCOUNTER — Inpatient Hospital Stay (HOSPITAL_COMMUNITY): Payer: Medicaid Other

## 2020-12-16 ENCOUNTER — Inpatient Hospital Stay (HOSPITAL_COMMUNITY): Payer: Medicaid Other | Admitting: Anesthesiology

## 2020-12-16 ENCOUNTER — Encounter (HOSPITAL_COMMUNITY)
Admission: EM | Disposition: A | Discharge status: Skilled Nursing Facility | Payer: Self-pay | Source: Home / Self Care | Attending: Internal Medicine

## 2020-12-16 DIAGNOSIS — B9620 Unspecified Escherichia coli [E. coli] as the cause of diseases classified elsewhere: Secondary | ICD-10-CM

## 2020-12-16 DIAGNOSIS — N12 Tubulo-interstitial nephritis, not specified as acute or chronic: Secondary | ICD-10-CM

## 2020-12-16 DIAGNOSIS — N201 Calculus of ureter: Secondary | ICD-10-CM

## 2020-12-16 DIAGNOSIS — N179 Acute kidney failure, unspecified: Secondary | ICD-10-CM

## 2020-12-16 DIAGNOSIS — R7881 Bacteremia: Secondary | ICD-10-CM

## 2020-12-16 DIAGNOSIS — J9601 Acute respiratory failure with hypoxia: Secondary | ICD-10-CM

## 2020-12-16 HISTORY — PX: CYSTOSCOPY W/ URETERAL STENT PLACEMENT: SHX1429

## 2020-12-16 LAB — GLUCOSE, CAPILLARY
Glucose-Capillary: 107 mg/dL — ABNORMAL HIGH (ref 70–99)
Glucose-Capillary: 141 mg/dL — ABNORMAL HIGH (ref 70–99)
Glucose-Capillary: 142 mg/dL — ABNORMAL HIGH (ref 70–99)
Glucose-Capillary: 157 mg/dL — ABNORMAL HIGH (ref 70–99)
Glucose-Capillary: 161 mg/dL — ABNORMAL HIGH (ref 70–99)
Glucose-Capillary: 99 mg/dL (ref 70–99)

## 2020-12-16 LAB — CBC
HCT: 28.8 % — ABNORMAL LOW (ref 39.0–52.0)
Hemoglobin: 9.5 g/dL — ABNORMAL LOW (ref 13.0–17.0)
MCH: 33 pg (ref 26.0–34.0)
MCHC: 33 g/dL (ref 30.0–36.0)
MCV: 100 fL (ref 80.0–100.0)
Platelets: 169 10*3/uL (ref 150–400)
RBC: 2.88 MIL/uL — ABNORMAL LOW (ref 4.22–5.81)
RDW: 16.3 % — ABNORMAL HIGH (ref 11.5–15.5)
WBC: 12.3 10*3/uL — ABNORMAL HIGH (ref 4.0–10.5)
nRBC: 0 % (ref 0.0–0.2)

## 2020-12-16 LAB — COMPREHENSIVE METABOLIC PANEL
ALT: 28 U/L (ref 0–44)
AST: 57 U/L — ABNORMAL HIGH (ref 15–41)
Albumin: 2.1 g/dL — ABNORMAL LOW (ref 3.5–5.0)
Alkaline Phosphatase: 116 U/L (ref 38–126)
Anion gap: 9 (ref 5–15)
BUN: 122 mg/dL — ABNORMAL HIGH (ref 6–20)
CO2: 22 mmol/L (ref 22–32)
Calcium: 7.9 mg/dL — ABNORMAL LOW (ref 8.9–10.3)
Chloride: 116 mmol/L — ABNORMAL HIGH (ref 98–111)
Creatinine, Ser: 2.94 mg/dL — ABNORMAL HIGH (ref 0.61–1.24)
GFR, Estimated: 24 mL/min — ABNORMAL LOW (ref >60)
Glucose, Bld: 167 mg/dL — ABNORMAL HIGH (ref 70–99)
Potassium: 3.9 mmol/L (ref 3.5–5.1)
Sodium: 147 mmol/L — ABNORMAL HIGH (ref 135–145)
Total Bilirubin: 1 mg/dL (ref 0.3–1.2)
Total Protein: 5.6 g/dL — ABNORMAL LOW (ref 6.5–8.1)

## 2020-12-16 LAB — MAGNESIUM: Magnesium: 2.9 mg/dL — ABNORMAL HIGH (ref 1.7–2.4)

## 2020-12-16 LAB — PHOSPHORUS: Phosphorus: 5.7 mg/dL — ABNORMAL HIGH (ref 2.5–4.6)

## 2020-12-16 LAB — PROCALCITONIN: Procalcitonin: 13.83 ng/mL

## 2020-12-16 SURGERY — CYSTOSCOPY, WITH RETROGRADE PYELOGRAM AND URETERAL STENT INSERTION
Anesthesia: General | Site: Ureter | Laterality: Left

## 2020-12-16 MED ORDER — MIDAZOLAM HCL 2 MG/2ML IJ SOLN
INTRAMUSCULAR | Status: AC
  Filled 2020-12-16: qty 2

## 2020-12-16 MED ORDER — ROCURONIUM BROMIDE 10 MG/ML (PF) SYRINGE
PREFILLED_SYRINGE | INTRAVENOUS | Status: DC | PRN
  Administered 2020-12-16: 30 mg via INTRAVENOUS
  Administered 2020-12-16: 50 mg via INTRAVENOUS

## 2020-12-16 MED ORDER — ROCURONIUM BROMIDE 10 MG/ML (PF) SYRINGE
PREFILLED_SYRINGE | INTRAVENOUS | Status: AC
  Filled 2020-12-16: qty 10

## 2020-12-16 MED ORDER — MIDAZOLAM HCL 2 MG/2ML IJ SOLN
2.0000 mg | INTRAMUSCULAR | Status: DC | PRN
  Administered 2020-12-16 (×2): 2 mg via INTRAVENOUS
  Filled 2020-12-16 (×2): qty 2

## 2020-12-16 MED ORDER — MIDAZOLAM 50MG/50ML (1MG/ML) PREMIX INFUSION
0.5000 mg/h | INTRAVENOUS | Status: DC
  Administered 2020-12-16: 1 mg/h via INTRAVENOUS
  Administered 2020-12-16: 4 mg/h via INTRAVENOUS
  Filled 2020-12-16 (×3): qty 50

## 2020-12-16 MED ORDER — IOHEXOL 300 MG/ML  SOLN
INTRAMUSCULAR | Status: DC | PRN
  Administered 2020-12-16: 5 mL

## 2020-12-16 MED ORDER — FENTANYL CITRATE (PF) 250 MCG/5ML IJ SOLN
INTRAMUSCULAR | Status: AC
  Filled 2020-12-16: qty 5

## 2020-12-16 MED ORDER — PROPOFOL 10 MG/ML IV BOLUS
INTRAVENOUS | Status: AC
  Filled 2020-12-16: qty 20

## 2020-12-16 MED ORDER — SUCCINYLCHOLINE CHLORIDE 200 MG/10ML IV SOSY
PREFILLED_SYRINGE | INTRAVENOUS | Status: AC
  Filled 2020-12-16: qty 10

## 2020-12-16 MED ORDER — WATER FOR IRRIGATION, STERILE IR SOLN
Status: DC | PRN
  Administered 2020-12-16: 3000 mL

## 2020-12-16 MED ORDER — MIDAZOLAM HCL 2 MG/2ML IJ SOLN
2.0000 mg | INTRAMUSCULAR | Status: DC | PRN
  Administered 2020-12-16: 2 mg via INTRAVENOUS

## 2020-12-16 MED ORDER — PHENYLEPHRINE 40 MCG/ML (10ML) SYRINGE FOR IV PUSH (FOR BLOOD PRESSURE SUPPORT)
PREFILLED_SYRINGE | INTRAVENOUS | Status: AC
  Filled 2020-12-16: qty 10

## 2020-12-16 MED ORDER — LIDOCAINE 2% (20 MG/ML) 5 ML SYRINGE
INTRAMUSCULAR | Status: AC
  Filled 2020-12-16: qty 5

## 2020-12-16 MED ORDER — FENTANYL CITRATE (PF) 250 MCG/5ML IJ SOLN
INTRAMUSCULAR | Status: DC | PRN
  Administered 2020-12-16: 50 ug via INTRAVENOUS

## 2020-12-16 MED ORDER — PHENYLEPHRINE HCL-NACL 10-0.9 MG/250ML-% IV SOLN
INTRAVENOUS | Status: DC | PRN
  Administered 2020-12-16: 25 ug/min via INTRAVENOUS

## 2020-12-16 MED ORDER — EPHEDRINE 5 MG/ML INJ
INTRAVENOUS | Status: AC
  Filled 2020-12-16: qty 10

## 2020-12-16 MED ORDER — SODIUM CHLORIDE 0.9 % IV SOLN
2.0000 g | Freq: Two times a day (BID) | INTRAVENOUS | Status: DC
  Administered 2020-12-16 – 2020-12-17 (×3): 2 g via INTRAVENOUS
  Filled 2020-12-16 (×3): qty 2

## 2020-12-16 MED ORDER — EPHEDRINE SULFATE-NACL 50-0.9 MG/10ML-% IV SOSY
PREFILLED_SYRINGE | INTRAVENOUS | Status: DC | PRN
  Administered 2020-12-16 (×2): 5 mg via INTRAVENOUS

## 2020-12-16 MED ORDER — 0.9 % SODIUM CHLORIDE (POUR BTL) OPTIME
TOPICAL | Status: DC | PRN
  Administered 2020-12-16: 1000 mL

## 2020-12-16 MED ORDER — LACTATED RINGERS IV SOLN: INTRAVENOUS | Status: DC | PRN

## 2020-12-16 SURGICAL SUPPLY — 26 items
BAG DRN RND TRDRP ANRFLXCHMBR (UROLOGICAL SUPPLIES) ×1
BAG URINE DRAIN 2000ML AR STRL (UROLOGICAL SUPPLIES) ×2 IMPLANT
BAG URO CATCHER STRL LF (MISCELLANEOUS) ×2 IMPLANT
CATH COUDE FOLEY 2W 5CC 18FR (CATHETERS) ×2 IMPLANT
CATH FOLEY 2WAY SLVR  5CC 16FR (CATHETERS)
CATH FOLEY 2WAY SLVR 5CC 16FR (CATHETERS) IMPLANT
CATH INTERMIT  6FR 70CM (CATHETERS) ×2 IMPLANT
GLOVE BIOGEL M STRL SZ7.5 (GLOVE) ×4 IMPLANT
GLOVE SURG ENC MOIS LTX SZ7.5 (GLOVE) ×2 IMPLANT
GOWN STRL REUS W/ TWL LRG LVL3 (GOWN DISPOSABLE) ×1 IMPLANT
GOWN STRL REUS W/ TWL XL LVL3 (GOWN DISPOSABLE) ×1 IMPLANT
GOWN STRL REUS W/TWL LRG LVL3 (GOWN DISPOSABLE) ×2
GOWN STRL REUS W/TWL XL LVL3 (GOWN DISPOSABLE) ×2
GUIDEWIRE ANG ZIPWIRE 038X150 (WIRE) IMPLANT
GUIDEWIRE STR DUAL SENSOR (WIRE) ×2 IMPLANT
KIT TURNOVER KIT B (KITS) ×2 IMPLANT
MANIFOLD NEPTUNE II (INSTRUMENTS) ×2 IMPLANT
NS IRRIG 1000ML POUR BTL (IV SOLUTION) IMPLANT
PACK CYSTO (CUSTOM PROCEDURE TRAY) ×2 IMPLANT
STENT CONTOUR 6FRX28X.038 (STENTS) ×2 IMPLANT
STENT URET 6FRX24 CONTOUR (STENTS) IMPLANT
STENT URET 6FRX26 CONTOUR (STENTS) IMPLANT
SYPHON OMNI JUG (MISCELLANEOUS) ×2 IMPLANT
TOWEL GREEN STERILE FF (TOWEL DISPOSABLE) ×2 IMPLANT
TUBE CONNECTING 12X1/4 (SUCTIONS) ×2 IMPLANT
WATER STERILE IRR 3000ML UROMA (IV SOLUTION) ×2 IMPLANT

## 2020-12-16 NOTE — Op Note (Addendum)
Preoperative diagnosis:  1. Left ureteral stone with coinciding E Coli UTI  Postoperative diagnosis: 1. Same  Procedure(s): 1. Cystourethroscopy 2. Left ureteral stent placement, 6 Fr x 28cm JJ without dangler 3. Left retrograde pyelogram 4. Simple foley catheter placement   Attending surgeon: Alfredo Martinez, MD  Resident surgeon: Margette Fast, MD  Anesthesia: General  Complications: None  EBL: 1cc  Specimens: Left renal pelvis urine culture  Drain: 18 Fr coude-tipped foley catheter  Implants: 6 Fr x 28 cm JJ ureteral stent without dangler  Intraoperative findings:  - Scout fluoroscopy without obvious radiopaque stone in left collecting system - Superficial mucosal abrasion with significant surrounding erythema at right bulbar urethra c/w prior traumatic foley removal - Bilobar prostate with kissing lateral lobes, high riding bladder neck  - Debris in bladder, mild patchy areas of erythematous bladder mucosa c/w foley being in place. No bladder stones, tumors, or masses. Orthotopic UOs  - Hydronephrotic drip with cannulation of left ureter proximal to stone, specimen stent for urine culture. Subsequent left RPG at level of renal pelvis without significant hydronephrosis; no filling defect - Successful placement of left ureteral stent and foley catheter   Indication:  [name] is a 60yo male who is admitted on 3/31 for E Coli UTI with normal renal ultrasound on admission. His hospitalization has been complicated by traumatic foley removal with balloon inflated with subsequent gross hematuria that quickly resolved with foley replacement. He clinically worsened from an infectious perspective, so a CT A/P was obtained yesterday, which showed an obstructing left distal ureteral 15mm stone. Given obstructing ureteral stone and coinciding UTI, we recommended proceeding with left ureteral stent placement. Risks and benefits of the procedure were discussed, and informed consented  was obtained.  Description of procedure: The patient was correctly identified in the pre-operative area and taken to the operating room. A pre-induction timeout was performed, and general anesthesia was induced. SCDs were placed for DVT prophylaxis. Appropriate peri-procedural antibiotics had already been administered. The patient was repositioned in the dorsal lithotomy position. He was prepped and draped in the typical sterile fashion. A surgical timeout was performed confirming the correct patient, procedure, and laterality.  Scout fluoroscopy demonstrated the above findings. We began by inserting a 22 Fr rigid cystoscope per the urethra with copious lubrication and normal saline irrigation fluid. We performed pan cystourethroscopy with the findings noted above. We turned our attention to the left ureteral orifice, which we cannulated with a sensor wire. We advanced the sensor wire up to the level of the renal pelvis under fluoroscopic guidance. We advanced a 6 Fr open-ended catheter over the wire up to the renal pelvis. Removal of the indwelling sensor wire revealed a hydronephrotic drip. Urine from the left renal pelvis was collected and sent for urine culture. We then performed a retrograde pyelogram at the level of the renal pelvis with the findings noted above. We replaced the sensor wire into the renal pelvis and removed the open-ended catheter leaving wire in place.   We then advanced a 6 Fr x 28cm ureteral stent without string over our sensor wire up to the level of the renal pelvis under fluoroscopic and visual guidance with the aid of a pusher. Removal of indwelling sensor wire demonstrated appropriate proximal stent curl in the renal pelvis and distal curl in the bladder. Urine was noted to be draining from the distal ureteral curl. We left the bladder full and removed all instrumentation. We inserted an 18 Fr coude-tipped foley catheter per urethra  and placed 10cc sterile water in the foley  balloon.   The patient was awoken from anesthesia having tolerated the procedure well.  Teaching attestation: Dr. Sherron Monday was present for all aspects of the case.   Plan: - Continue foley catheter at least until 4/11 for urethral healing after traumatic foley removal on 4/4. If patient is extubated, ambulatory, and afebrile + hemodynamically stable for at least 24 hours at that time, then ok to remove foley and perform a trial of void - Continue culture-specific antibiotics for pan-sensitive E Coli UTI. Patient will need ~2 weeks of antibiotics for complicated UTI - We will arrange patient for outpatient ureteroscopic stone extraction in ~2 weeks (once he has cleared his current infection and is medically safe to undergo surgery) - I was present and assisted entire case

## 2020-12-16 NOTE — Progress Notes (Signed)
Pharmacy Antibiotic Note  [name] is a 60 y.o. male admitted on 12/10/2020 with E.coli bacteremia and now with concern for superimposed PNA. Pharmacy has been consulted to broaden Rocephin to Cefepime.   Plan: - Start Cefepime 2g IV every 12 hours - Will continue to follow renal function, culture results, LOT, and antibiotic de-escalation plans   Height: _0  (188 cm) Weight: (!) 145.5 kg (320 lb 12.3 oz) IBW/kg (Calculated) : 82.2  Temp (24hrs), Avg:100 F (37.8 C), Min:98.2 F (36.8 C), Max:102.2 F (39 C)  Recent Labs  Lab 12/10/20 0416 12/10/20 0745 12/10/20 1407 12/10/20 2030 12/11/20 0114 12/12/20 0446 12/13/20 0400 12/14/20 0611 12/15/20 0115 12/16/20 0457  WBC  --   --   --   --    < > 6.5 9.1 9.9 11.5* 12.3*  CREATININE  --   --  5.36*  --    < > 3.89* 2.82* 2.73* 2.67* 2.94*  LATICACIDVEN 7.4* 3.5* 3.4* 2.5*  --   --   --   --   --   --    < > = values in this interval not displayed.    Estimated Creatinine Clearance: 41.6 mL/min (A) (by C-G formula based on SCr of 2.94 mg/dL (H)).    Allergies  Allergen Reactions  . Ibuprofen Other (See Comments)    Blood in the stool.  . Metformin Other (See Comments)    Tremors, muscles  locking up, unable to control extremities    Zosyn 3/31>>3/31 Rocephin 3/31>> 4/4 Ancef 4/4 >> 4/6 Cefepime 4/6 >>  3/31 BCx Ecoli (pan sens) 3/31 UCx Ecoli (pan sens) 4/5 RCx (TA) >> mod GNR + rare GPC on GS >> 4/5 BAL cx >> neg  Thank you for allowing pharmacy to be a part of this patient's care.  Alycia Rossetti, PharmD, BCPS Clinical Pharmacist Clinical phone for 12/16/2020: [phone] 12/16/2020 12:37 PM   **Pharmacist phone directory can now be found on [email or web address] ([email or web address]).  Listed under [place].

## 2020-12-16 NOTE — Progress Notes (Signed)
OT Cancellation Note  Patient Details Name: [name] MRN: [id number] DOB: 11-10-1960   Cancelled Treatment:    Reason Eval/Treat Not Completed: Patient not medically ready (Pt remains intubated and sedated following yesterday's failed extubation. RN reports plan for weaning sedation today. OT to follow and proceed with eval when medically ready.)   Flora Lipps, OTR/L Acute Rehabilitation Services Pager: [phone] Office: [phone]   [name] 12/16/2020, 1:32 PM

## 2020-12-16 NOTE — Anesthesia Preprocedure Evaluation (Signed)
Anesthesia Evaluation  Patient identified by MRN, date of birth, ID band Patient unresponsive  Preop documentation limited or incomplete due to emergent nature of procedure.  Airway Mallampati: Intubated       Dental   Pulmonary       + intubated    Cardiovascular      Neuro/Psych    GI/Hepatic   Endo/Other    Renal/GU Renal disease     Musculoskeletal   Abdominal   Peds  Hematology   Anesthesia Other Findings   Reproductive/Obstetrics                             Anesthesia Physical Anesthesia Plan  ASA: IV  Anesthesia Plan: General   Post-op Pain Management:    Induction: Intravenous  PONV Risk Score and Plan: Treatment may vary due to age or medical condition  Airway Management Planned: Oral ETT  Additional Equipment:   Intra-op Plan:   Post-operative Plan: Post-operative intubation/ventilation  Informed Consent: I have reviewed the patients History and Physical, chart, labs and discussed the procedure including the risks, benefits and alternatives for the proposed anesthesia with the patient or authorized representative who has indicated his/her understanding and acceptance.       Plan Discussed with: CRNA and Anesthesiologist  Anesthesia Plan Comments:         Anesthesia Quick Evaluation

## 2020-12-16 NOTE — Progress Notes (Signed)
NAME:  [name], MRN:  [id number], DOB:  Dec 06, 1960, LOS: 6 ADMISSION DATE:  12/10/2020,   History of Present Illness:  This is a 60 year old white male who presented to the emergency room from home.  The patient's wife explains that the patient's had severe diarrhea that started approximately 4 to 5 days ago.  He had also had vomiting that started approximately 24 hours after the diarrhea.  Over the past 24 hours patient has become more confused and at times belligerent.  Patient's wife states that she noticed he became very cold to touch.  EMS was called patient was brought to the emergency room for further evaluation.  While in the emergency room patient found to be hypotensive, combative, confused, and was intubated.  He was noted to have mottling over his trunk and extremities.  Pertinent  Medical History  Prior smoker quit 15 years ago 82-100-pack-year history Sinusitis  Chronic pain syndrome Hypertension Hyperlipidemia Chronic back pain  Significant Hospital Events: Including procedures, antibiotic start and stop dates in addition to other pertinent events   . 12/10/2020 patient was intubated . 12/10/2020 triple-lumen catheter inserted . 12/11/2020 off pressors. E coli bacteremia, e coli uti. Some improvement in pulm opacities on CXR. On rocephin  . 4/5 extubated, failed immediately due to mucous plugging identified on bronch after re-intubated.  Marland Kitchen 4/6 broadened to cefepime in light of pneumonia on CT abdomen  Interim History / Subjective:  Failed extubation yesterday  Objective   Blood pressure (!) 103/59, pulse (!) 58, temperature 98.3 F (36.8 C), temperature source Esophageal, resp. rate (!) 26, height _0  (1.88 m), weight (!) 145.5 kg, SpO2 99 %.    Vent Mode: PRVC FiO2 (%):  [40 %-100 %] 40 % Set Rate:  [26 bmp] 26 bmp Vt Set:  [678 mL] 660 mL PEEP:  [8 cmH20] 8 cmH20 Plateau Pressure:  [23 cmH20-25 cmH20] 23 cmH20   Intake/Output Summary (Last 24 hours) at  12/16/2020 0851 Last data filed at 12/16/2020 0800 Gross per 24 hour  Intake 4110.29 ml  Output 3025 ml  Net 1085.29 ml   Filed Weights   12/14/20 0337 12/15/20 0240 12/16/20 0245  Weight: (!) 147.1 kg (!) 144.6 kg (!) 145.5 kg    Examination:  Constitutional: Obese male on vent Eyes: pupils pinpoint Ears, nose, mouth, and throat: ETT, OGT,  no JVD Cardiovascular: RRR, no MRG, no sig edema.  Respiratory: Clear Gastrointestinal: Soft, non-distended, normoactive Skin: Grossly intact Neurologic: RASS -3.   Labs/imaging that I have personally reviewed  (right click and "Reselect all SmartList Selections" daily)  BUN higher still Cr rising Na 147  CT abd 4/5 > 44m left ureteral stone with mild edema of the L kidney. Large areas of consolidation in bilateral lung bases.    Resolved Hospital Problem list     Assessment & Plan:  Acute hypercarbic respiratory failure inability to compensate for increased metabolic demand; question aspiration pneumonitis. Dense pneumonia noted on CT abdomen - Full vent support - RASS goal -2 - Need to wean propofol due to elevated triglycerides - Precedex, fentanyl infusions   Acute renal failure: septic ATN, pylenephritis. L ureteral stone.  - Continuing to worsen - With worsening uremia we may need to consider HD vs more aggressive diuresis.  - Continue to monitor - Free water flush - Urology to see.   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 [place] - LFTs up a bit suspect congestive, INR okay - trend LFT  DM2 with hyperglycemia  - continue levemir - TF coverage  Sepsis-associated DIC - improved  Stress cardiomyopathy- w/ type 2 NSTEMI, would benefit from repeating as OP to make sure not masking ischemic heart disease  Traumatic urethral bleeding after  foley manipulation by patient - Self-resolved, foley replaced, monitor  AAA infrarenal 4.8 cm - serial CT follow up   Best practice (right click and "Reselect all SmartList Selections" daily)  Diet:  Tube Feed  Pain/Anxiety/Delirium protocol (if indicated): Yes (RASS goal -1) VAP protocol (if indicated): Yes DVT prophylaxis: SCD GI prophylaxis: PPI Glucose control:  See above Central venous access:  Yes, and it is still needed Arterial line:  N/A Foley:  Yes, and it is still needed Mobility:  bed rest  PT consulted: N/A Last date of multidisciplinary goals of care discussion - wife at bedside 12/15/20 Code Status:  full code Disposition: ICU  Critical care time; 45 minutes  Georgann Housekeeper, AGACNP-BC [place] for personal pager PCCM on call pager [phone] until 7pm. Please call Elink 7p-7a. [phone]  12/16/2020 8:51 AM

## 2020-12-16 NOTE — Progress Notes (Signed)
PT Cancellation Note  Patient Details Name: [name] MRN: [id number] DOB: 08/13/1961   Cancelled Treatment:    Reason Eval/Treat Not Completed: Patient not medically ready. Pt remains intubated and sedated following yesterday's failed extubation. RN reports plan for weaning sedation today. PT to follow and proceed with eval when medically ready.   Ilda Foil 12/16/2020, 11:36 AM  Aida Raider, PT  Office # [phone] Pager [phone]

## 2020-12-16 NOTE — Transfer of Care (Signed)
Immediate Anesthesia Transfer of Care Note  Patient: [name]  Procedure(s) Performed: CYSTOSCOPY WITH RETROGRADE PYELOGRAM/URETERAL STENT PLACEMENT (Left Ureter)  Patient Location: ICU  Anesthesia Type:General  Level of Consciousness: Patient remains intubated per anesthesia plan  Airway & Oxygen Therapy: Patient remains intubated per anesthesia plan and Patient placed on Ventilator (see vital sign flow sheet for setting)  Post-op Assessment: Report given to RN and Post -op Vital signs reviewed and stable  Post vital signs: Reviewed and stable  Last Vitals:  Vitals Value Taken Time  BP 105/71   Temp    Pulse 71   Resp 16   SpO2 98     Last Pain:  Vitals:   12/16/20 1959  TempSrc: Oral  PainSc:      Report to RN and RT in ICU, pre-procedure infusions maintained at pre-procedure rate, VSS, full report given, RT applied to ventilator via previous ICU settings, transfer of patient in safe and stable condition.    Complications: No complications documented.

## 2020-12-16 NOTE — Consult Note (Signed)
Urology Consult  Referring physician: Erskine Emery Reason for referral: infected stone  Chief Complaint: infected stone  History of Present Illness: Admitted with UTI E Coli sepsis; positive urine and blood cultures; recently extubated; normal renal ultrasound April 1; hs bilateral pneumonia and intubated again;   No history from patient  CT April 5: The adrenal glands unremarkable. There is apparent mild edema of the left kidney. There is a 4 mm stone in the distal left ureter. No hydronephrosis. Correlation with urinalysis recommended to evaluate for pyelonephritis. No stone noted in the left kidney. There is no hydronephrosis or nephrolithiasis on the right. The right ureter appears unremarkable. The urinary bladder is decompressed around a Foley catheter.  Cr 2.94 (was 3.89) Hb 9.5 Wbc 12.3  Spoke with Dr Tamala Julian re perc vs. Stent     No past medical history on file.   Medications: I have reviewed the patient's current medications. Allergies:  Allergies  Allergen Reactions  . Ibuprofen Other (See Comments)    Blood in the stool.  . Metformin Other (See Comments)    Tremors, muscles  locking up, unable to control extremities    No family history on file. Social History:  has no history on file for tobacco use, alcohol use, and drug use.  ROS: All systems are reviewed and negative except as noted. Rest negative   Physical Exam:  Vital signs in last 24 hours: Temp:  [98.2 F (36.8 C)-102.2 F (39 C)] 98.3 F (36.8 C) (04/06 0748) Pulse Rate:  [56-74] 60 (04/06 1000) Resp:  [19-27] 26 (04/06 1000) BP: (99-122)/(59-76) 109/60 (04/06 1000) SpO2:  [98 %-100 %] 99 % (04/06 1000) FiO2 (%):  [40 %-60 %] 40 % (04/06 1000) Weight:  [145.5 kg] 145.5 kg (04/06 0245)  Cardiovascular: Skin warm; not flushed Respiratory: Breaths quiet; no shortness of breath Abdomen: No masses Neurological: Normal sensation to touch Musculoskeletal: Normal motor function arms and  legs Lymphatics: No inguinal adenopathy Skin: No rashes Genitourinary:penile edema, obese, 22 Fr catheter draining well  Laboratory Data:  Results for orders placed or performed during the hospital encounter of 12/10/20 (from the past 72 hour(s))  Glucose, capillary     Status: Abnormal   Collection Time: 12/13/20 11:30 AM  Result Value Ref Range   Glucose-Capillary 198 (H) 70 - 99 mg/dL    Comment: Glucose reference range applies only to samples taken after fasting for at least 8 hours.  Glucose, capillary     Status: Abnormal   Collection Time: 12/13/20  4:08 PM  Result Value Ref Range   Glucose-Capillary 192 (H) 70 - 99 mg/dL    Comment: Glucose reference range applies only to samples taken after fasting for at least 8 hours.  Glucose, capillary     Status: Abnormal   Collection Time: 12/13/20  7:16 PM  Result Value Ref Range   Glucose-Capillary 150 (H) 70 - 99 mg/dL    Comment: Glucose reference range applies only to samples taken after fasting for at least 8 hours.  Glucose, capillary     Status: Abnormal   Collection Time: 12/13/20 11:18 PM  Result Value Ref Range   Glucose-Capillary 161 (H) 70 - 99 mg/dL    Comment: Glucose reference range applies only to samples taken after fasting for at least 8 hours.  Glucose, capillary     Status: Abnormal   Collection Time: 12/14/20  3:24 AM  Result Value Ref Range   Glucose-Capillary 165 (H) 70 - 99 mg/dL  Comment: Glucose reference range applies only to samples taken after fasting for at least 8 hours.  Comprehensive metabolic panel     Status: Abnormal   Collection Time: 12/14/20  6:11 AM  Result Value Ref Range   Sodium 144 135 - 145 mmol/L   Potassium 3.7 3.5 - 5.1 mmol/L   Chloride 113 (H) 98 - 111 mmol/L   CO2 23 22 - 32 mmol/L   Glucose, Bld 144 (H) 70 - 99 mg/dL    Comment: Glucose reference range applies only to samples taken after fasting for at least 8 hours.   BUN 97 (H) 6 - 20 mg/dL   Creatinine, Ser 2.73 (H)  0.61 - 1.24 mg/dL   Calcium 8.3 (L) 8.9 - 10.3 mg/dL   Total Protein 5.2 (L) 6.5 - 8.1 g/dL   Albumin 2.0 (L) 3.5 - 5.0 g/dL   AST 88 (H) 15 - 41 U/L   ALT 95 (H) 0 - 44 U/L   Alkaline Phosphatase 101 38 - 126 U/L   Total Bilirubin 1.3 (H) 0.3 - 1.2 mg/dL   GFR, Estimated 26 (L) >60 mL/min    Comment: (NOTE) Calculated using the CKD-EPI Creatinine Equation (2021)    Anion gap 8 5 - 15    Comment: Performed at [facility], [place] [address], [place], [place] [address]  CBC     Status: Abnormal   Collection Time: 12/14/20  6:11 AM  Result Value Ref Range   WBC 9.9 4.0 - 10.5 K/uL   RBC 3.11 (L) 4.22 - 5.81 MIL/uL   Hemoglobin 10.0 (L) 13.0 - 17.0 g/dL   HCT 29.3 (L) 39.0 - 52.0 %   MCV 94.2 80.0 - 100.0 fL   MCH 32.2 26.0 - 34.0 pg   MCHC 34.1 30.0 - 36.0 g/dL   RDW 15.3 11.5 - 15.5 %   Platelets 101 (L) 150 - 400 K/uL    Comment: Immature Platelet Fraction may be clinically indicated, consider ordering this additional test [id number] CONSISTENT WITH PREVIOUS RESULT REPEATED TO VERIFY    nRBC 0.2 0.0 - 0.2 %    Comment: Performed at [facility], [place] [address] [place], [place] [address]  Magnesium     Status: Abnormal   Collection Time: 12/14/20  6:11 AM  Result Value Ref Range   Magnesium 2.6 (H) 1.7 - 2.4 mg/dL    Comment: Performed at [facility] [address] [place], [place] [address]  Phosphorus     Status: None   Collection Time: 12/14/20  6:11 AM  Result Value Ref Range   Phosphorus 3.1 2.5 - 4.6 mg/dL    Comment: Performed at [facility] [address], [place], [place] [address]  Triglycerides     Status: Abnormal   Collection Time: 12/14/20  6:11 AM  Result Value Ref Range   Triglycerides 422 (H) <150 mg/dL    Comment: Performed at [facility] [address] [place], [place] [address]  Glucose, capillary     Status: Abnormal   Collection Time: 12/14/20  7:33 AM  Result Value Ref Range   Glucose-Capillary 139 (H) 70 -  99 mg/dL    Comment: Glucose reference range applies only to samples taken after fasting for at least 8 hours.  Glucose, capillary     Status: Abnormal   Collection Time: 12/14/20 11:30 AM  Result Value Ref Range   Glucose-Capillary 102 (H) 70 - 99 mg/dL  Comment: Glucose reference range applies only to samples taken after fasting for at least 8 hours.  Glucose, capillary     Status: Abnormal   Collection Time: 12/14/20  4:57 PM  Result Value Ref Range   Glucose-Capillary 151 (H) 70 - 99 mg/dL    Comment: Glucose reference range applies only to samples taken after fasting for at least 8 hours.  Glucose, capillary     Status: Abnormal   Collection Time: 12/14/20  7:34 PM  Result Value Ref Range   Glucose-Capillary 112 (H) 70 - 99 mg/dL    Comment: Glucose reference range applies only to samples taken after fasting for at least 8 hours.  Glucose, capillary     Status: None   Collection Time: 12/14/20 11:20 PM  Result Value Ref Range   Glucose-Capillary 94 70 - 99 mg/dL    Comment: Glucose reference range applies only to samples taken after fasting for at least 8 hours.  Comprehensive metabolic panel     Status: Abnormal   Collection Time: 12/15/20  1:15 AM  Result Value Ref Range   Sodium 148 (H) 135 - 145 mmol/L   Potassium 4.3 3.5 - 5.1 mmol/L   Chloride 116 (H) 98 - 111 mmol/L   CO2 22 22 - 32 mmol/L   Glucose, Bld 113 (H) 70 - 99 mg/dL    Comment: Glucose reference range applies only to samples taken after fasting for at least 8 hours.   BUN 100 (H) 6 - 20 mg/dL   Creatinine, Ser 2.67 (H) 0.61 - 1.24 mg/dL   Calcium 8.4 (L) 8.9 - 10.3 mg/dL   Total Protein 5.8 (L) 6.5 - 8.1 g/dL   Albumin 2.5 (L) 3.5 - 5.0 g/dL   AST 64 (H) 15 - 41 U/L   ALT 69 (H) 0 - 44 U/L   Alkaline Phosphatase 117 38 - 126 U/L   Total Bilirubin 1.3 (H) 0.3 - 1.2 mg/dL   GFR, Estimated 27 (L) >60 mL/min    Comment: (NOTE) Calculated using the CKD-EPI Creatinine Equation (2021)    Anion gap 10 5 -  15    Comment: Performed at [facility], [place] [address] [place], [place] [address]  CBC     Status: Abnormal   Collection Time: 12/15/20  1:15 AM  Result Value Ref Range   WBC 11.5 (H) 4.0 - 10.5 K/uL   RBC 3.12 (L) 4.22 - 5.81 MIL/uL   Hemoglobin 10.0 (L) 13.0 - 17.0 g/dL   HCT 30.2 (L) 39.0 - 52.0 %   MCV 96.8 80.0 - 100.0 fL   MCH 32.1 26.0 - 34.0 pg   MCHC 33.1 30.0 - 36.0 g/dL   RDW 15.9 (H) 11.5 - 15.5 %   Platelets 135 (L) 150 - 400 K/uL   nRBC 0.0 0.0 - 0.2 %    Comment: Performed at [facility] [address], [place], [place] [address]  Magnesium     Status: None   Collection Time: 12/15/20  1:15 AM  Result Value Ref Range   Magnesium 2.4 1.7 - 2.4 mg/dL    Comment: Performed at [facility] [address], [place], [place] [address]  Phosphorus     Status: None   Collection Time: 12/15/20  1:15 AM  Result Value Ref Range   Phosphorus 4.3 2.5 - 4.6 mg/dL    Comment: Performed at [facility] [address], [place], [place] [address]  Triglycerides  Status: Abnormal   Collection Time: 12/15/20  1:15 AM  Result Value Ref Range   Triglycerides 269 (H) <150 mg/dL    Comment: Performed at [facility] [address] [place], [place] [address]  Procalcitonin - Baseline     Status: None   Collection Time: 12/15/20  1:15 AM  Result Value Ref Range   Procalcitonin 22.73 ng/mL    Comment:        Interpretation: PCT >= 10 ng/mL: Important systemic inflammatory response, almost exclusively due to severe bacterial sepsis or septic shock. (NOTE)       Sepsis PCT Algorithm           Lower Respiratory Tract                                      Infection PCT Algorithm    ----------------------------     ----------------------------         PCT < 0.25 ng/mL                PCT < 0.10 ng/mL          Strongly encourage             Strongly discourage   discontinuation of antibiotics    initiation of antibiotics     ----------------------------     -----------------------------       PCT 0.25 - 0.50 ng/mL            PCT 0.10 - 0.25 ng/mL               OR       >80% decrease in PCT            Discourage initiation of                                            antibiotics      Encourage discontinuation           of antibiotics    ----------------------------     -----------------------------         PCT >= 0.50 ng/mL              PCT 0.26 - 0.50 ng/mL                AND       <80% decrease in PCT             Encourage initiation of                                             antibiotics       Encourage continuation           of antibiotics    ----------------------------     -----------------------------        PCT >= 0.50 ng/mL                  PCT > 0.50 ng/mL               AND         increase in PCT  Strongly encourage                                      initiation of antibiotics    Strongly encourage escalation           of antibiotics                                     -----------------------------                                           PCT <= 0.25 ng/mL                                                 OR                                        > 80% decrease in PCT                                      Discontinue / Do not initiate                                             antibiotics  Performed at [facility], [place] [address], [place], [place] [address]   Glucose, capillary     Status: Abnormal   Collection Time: 12/15/20  3:22 AM  Result Value Ref Range   Glucose-Capillary 139 (H) 70 - 99 mg/dL    Comment: Glucose reference range applies only to samples taken after fasting for at least 8 hours.  Glucose, capillary     Status: Abnormal   Collection Time: 12/15/20  7:38 AM  Result Value Ref Range   Glucose-Capillary 170 (H) 70 - 99 mg/dL    Comment: Glucose reference range applies only to samples taken after fasting for at least 8 hours.  Culture,  Respiratory w Gram Stain     Status: None (Preliminary result)   Collection Time: 12/15/20  7:39 AM   Specimen: Tracheal Aspirate; Respiratory  Result Value Ref Range   Specimen Description TRACHEAL ASPIRATE    Special Requests NONE    Gram Stain      RARE WBC PRESENT, PREDOMINANTLY PMN MODERATE GRAM NEGATIVE RODS RARE GRAM POSITIVE COCCI    Culture      CULTURE REINCUBATED FOR BETTER GROWTH Performed at [facility], [place] [address] [place], [place] [address]    Report Status PENDING   Culture, BAL-quantitative w Gram Stain     Status: None (Preliminary result)   Collection Time: 12/15/20  9:36 AM   Specimen: Bronchoalveolar Lavage; Respiratory  Result Value Ref Range   Specimen Description BRONCHIAL ALVEOLAR LAVAGE    Special Requests NONE    Gram Stain      FEW WBC PRESENT,BOTH  PMN AND MONONUCLEAR NO ORGANISMS SEEN    Culture      CULTURE REINCUBATED FOR BETTER GROWTH Performed at [facility], [place] [address], [place], [place] [address]    Report Status PENDING   I-STAT 7, (LYTES, BLD GAS, ICA, H+H)     Status: Abnormal   Collection Time: 12/15/20 10:22 AM  Result Value Ref Range   pH, Arterial 7.296 (L) 7.350 - 7.450   pCO2 arterial 52.4 (H) 32.0 - 48.0 mmHg   pO2, Arterial 173 (H) 83.0 - 108.0 mmHg   Bicarbonate 24.8 20.0 - 28.0 mmol/L   TCO2 26 22 - 32 mmol/L   O2 Saturation 99.0 %   Acid-base deficit 1.0 0.0 - 2.0 mmol/L   Sodium 151 (H) 135 - 145 mmol/L   Potassium 4.1 3.5 - 5.1 mmol/L   Calcium, Ion 1.19 1.15 - 1.40 mmol/L   HCT 29.0 (L) 39.0 - 52.0 %   Hemoglobin 9.9 (L) 13.0 - 17.0 g/dL   Patient temperature 103.3 F    Collection site Radial    Drawn by RT    Sample type ARTERIAL   Glucose, capillary     Status: Abnormal   Collection Time: 12/15/20 11:19 AM  Result Value Ref Range   Glucose-Capillary 118 (H) 70 - 99 mg/dL    Comment: Glucose reference range applies only to samples taken after fasting for at least 8 hours.  Glucose,  capillary     Status: Abnormal   Collection Time: 12/15/20  4:06 PM  Result Value Ref Range   Glucose-Capillary 133 (H) 70 - 99 mg/dL    Comment: Glucose reference range applies only to samples taken after fasting for at least 8 hours.  Glucose, capillary     Status: Abnormal   Collection Time: 12/15/20  7:47 PM  Result Value Ref Range   Glucose-Capillary 140 (H) 70 - 99 mg/dL    Comment: Glucose reference range applies only to samples taken after fasting for at least 8 hours.  Glucose, capillary     Status: Abnormal   Collection Time: 12/15/20 11:26 PM  Result Value Ref Range   Glucose-Capillary 190 (H) 70 - 99 mg/dL    Comment: Glucose reference range applies only to samples taken after fasting for at least 8 hours.  Glucose, capillary     Status: Abnormal   Collection Time: 12/16/20  3:23 AM  Result Value Ref Range   Glucose-Capillary 142 (H) 70 - 99 mg/dL    Comment: Glucose reference range applies only to samples taken after fasting for at least 8 hours.  Comprehensive metabolic panel     Status: Abnormal   Collection Time: 12/16/20  4:57 AM  Result Value Ref Range   Sodium 147 (H) 135 - 145 mmol/L   Potassium 3.9 3.5 - 5.1 mmol/L   Chloride 116 (H) 98 - 111 mmol/L   CO2 22 22 - 32 mmol/L   Glucose, Bld 167 (H) 70 - 99 mg/dL    Comment: Glucose reference range applies only to samples taken after fasting for at least 8 hours.   BUN 122 (H) 6 - 20 mg/dL   Creatinine, Ser 2.94 (H) 0.61 - 1.24 mg/dL   Calcium 7.9 (L) 8.9 - 10.3 mg/dL   Total Protein 5.6 (L) 6.5 - 8.1 g/dL   Albumin 2.1 (L) 3.5 - 5.0 g/dL   AST 57 (H) 15 - 41 U/L   ALT 28 0 - 44 U/L   Alkaline Phosphatase 116 38 -  126 U/L   Total Bilirubin 1.0 0.3 - 1.2 mg/dL   GFR, Estimated 24 (L) >60 mL/min    Comment: (NOTE) Calculated using the CKD-EPI Creatinine Equation (2021)    Anion gap 9 5 - 15    Comment: Performed at [facility] [address] [place], [place] [address]  CBC     Status: Abnormal    Collection Time: 12/16/20  4:57 AM  Result Value Ref Range   WBC 12.3 (H) 4.0 - 10.5 K/uL   RBC 2.88 (L) 4.22 - 5.81 MIL/uL   Hemoglobin 9.5 (L) 13.0 - 17.0 g/dL   HCT 28.8 (L) 39.0 - 52.0 %   MCV 100.0 80.0 - 100.0 fL   MCH 33.0 26.0 - 34.0 pg   MCHC 33.0 30.0 - 36.0 g/dL   RDW 16.3 (H) 11.5 - 15.5 %   Platelets 169 150 - 400 K/uL   nRBC 0.0 0.0 - 0.2 %    Comment: Performed at [facility], [place] [address] [place], [place] [address]  Magnesium     Status: Abnormal   Collection Time: 12/16/20  4:57 AM  Result Value Ref Range   Magnesium 2.9 (H) 1.7 - 2.4 mg/dL    Comment: Performed at [facility] [address], [place], [place] [address]  Phosphorus     Status: Abnormal   Collection Time: 12/16/20  4:57 AM  Result Value Ref Range   Phosphorus 5.7 (H) 2.5 - 4.6 mg/dL    Comment: Performed at [facility] [address], [place], [place] [address]  Glucose, capillary     Status: Abnormal   Collection Time: 12/16/20  7:45 AM  Result Value Ref Range   Glucose-Capillary 157 (H) 70 - 99 mg/dL    Comment: Glucose reference range applies only to samples taken after fasting for at least 8 hours.   Recent Results (from the past 240 hour(s))  Culture, blood (routine x 2)     Status: Abnormal   Collection Time: 12/10/20  3:50 AM   Specimen: BLOOD  Result Value Ref Range Status   Specimen Description BLOOD LEFT ANTECUBITAL  Final   Special Requests   Final    BOTTLES DRAWN AEROBIC AND ANAEROBIC Blood Culture adequate volume   Culture  Setup Time   Final    GRAM NEGATIVE RODS IN BOTH AEROBIC AND ANAEROBIC BOTTLES CRITICAL RESULT CALLED TO, READ BACK BY AND VERIFIED WITHJeanette Caprice [facility] 1638 12/10/20 A BROWNING Performed at [facility], [place] [address] [place], [place] [address]    Culture ESCHERICHIA COLI (A)  Final   Report Status 12/12/2020 FINAL  Final   Organism ID, Bacteria ESCHERICHIA COLI  Final      Susceptibility   Escherichia coli - MIC*     AMPICILLIN 8 SENSITIVE Sensitive     CEFAZOLIN <=4 SENSITIVE Sensitive     CEFEPIME <=0.12 SENSITIVE Sensitive     CEFTAZIDIME <=1 SENSITIVE Sensitive     CEFTRIAXONE <=0.25 SENSITIVE Sensitive     CIPROFLOXACIN <=0.25 SENSITIVE Sensitive     GENTAMICIN <=1 SENSITIVE Sensitive     IMIPENEM <=0.25 SENSITIVE Sensitive     TRIMETH/SULFA <=20 SENSITIVE Sensitive     AMPICILLIN/SULBACTAM <=2 SENSITIVE Sensitive     PIP/TAZO <=4 SENSITIVE Sensitive     * ESCHERICHIA COLI  Blood Culture ID Panel (Reflexed)     Status: Abnormal   Collection Time: 12/10/20  3:50 AM  Result Value Ref  Range Status   Enterococcus faecalis NOT DETECTED NOT DETECTED Final   Enterococcus Faecium NOT DETECTED NOT DETECTED Final   Listeria monocytogenes NOT DETECTED NOT DETECTED Final   Staphylococcus species NOT DETECTED NOT DETECTED Final   Staphylococcus aureus (BCID) NOT DETECTED NOT DETECTED Final   Staphylococcus epidermidis NOT DETECTED NOT DETECTED Final   Staphylococcus lugdunensis NOT DETECTED NOT DETECTED Final   Streptococcus species NOT DETECTED NOT DETECTED Final   Streptococcus agalactiae NOT DETECTED NOT DETECTED Final   Streptococcus pneumoniae NOT DETECTED NOT DETECTED Final   Streptococcus pyogenes NOT DETECTED NOT DETECTED Final   A.calcoaceticus-baumannii NOT DETECTED NOT DETECTED Final   Bacteroides fragilis NOT DETECTED NOT DETECTED Final   Enterobacterales DETECTED (A) NOT DETECTED Final    Comment: Enterobacterales represent a large order of gram negative bacteria, not a single organism. CRITICAL RESULT CALLED TO, READ BACK BY AND VERIFIED WITH: Jeanette Caprice PHARMD 2703 12/10/20 A BROWNING    Enterobacter cloacae complex NOT DETECTED NOT DETECTED Final   Escherichia coli DETECTED (A) NOT DETECTED Final    Comment: CRITICAL RESULT CALLED TO, READ BACK BY AND VERIFIED WITH: Jeanette Caprice PHARMD [id number] 12/10/20 A BROWNING    Klebsiella aerogenes NOT DETECTED NOT DETECTED Final   Klebsiella oxytoca NOT  DETECTED NOT DETECTED Final   Klebsiella pneumoniae NOT DETECTED NOT DETECTED Final   Proteus species NOT DETECTED NOT DETECTED Final   Salmonella species NOT DETECTED NOT DETECTED Final   Serratia marcescens NOT DETECTED NOT DETECTED Final   Haemophilus influenzae NOT DETECTED NOT DETECTED Final   Neisseria meningitidis NOT DETECTED NOT DETECTED Final   Pseudomonas aeruginosa NOT DETECTED NOT DETECTED Final   Stenotrophomonas maltophilia NOT DETECTED NOT DETECTED Final   Candida albicans NOT DETECTED NOT DETECTED Final   Candida auris NOT DETECTED NOT DETECTED Final   Candida glabrata NOT DETECTED NOT DETECTED Final   Candida krusei NOT DETECTED NOT DETECTED Final   Candida parapsilosis NOT DETECTED NOT DETECTED Final   Candida tropicalis NOT DETECTED NOT DETECTED Final   Cryptococcus neoformans/gattii NOT DETECTED NOT DETECTED Final   CTX-M ESBL NOT DETECTED NOT DETECTED Final   Carbapenem resistance IMP NOT DETECTED NOT DETECTED Final   Carbapenem resistance KPC NOT DETECTED NOT DETECTED Final   Carbapenem resistance NDM NOT DETECTED NOT DETECTED Final   Carbapenem resist OXA 48 LIKE NOT DETECTED NOT DETECTED Final   Carbapenem resistance VIM NOT DETECTED NOT DETECTED Final    Comment: Performed at [facility], [address], [place],  [address]  Resp Panel by RT-PCR (Flu A&B, Covid) Nasopharyngeal Swab     Status: None   Collection Time: 12/10/20  3:59 AM   Specimen: Nasopharyngeal Swab; Nasopharyngeal(NP) swabs in vial transport medium  Result Value Ref Range Status   SARS Coronavirus 2 by RT PCR NEGATIVE NEGATIVE Final    Comment: (NOTE) SARS-CoV-2 target nucleic acids are NOT DETECTED.  The SARS-CoV-2 RNA is generally detectable in upper respiratory specimens during the acute phase of infection. The lowest concentration of SARS-CoV-2 viral copies this assay can detect is 138 copies/mL. A negative result does not preclude SARS-Cov-2 infection and should not be  used as the sole basis for treatment or other patient management decisions. A negative result may occur with  improper specimen collection/handling, submission of specimen other than nasopharyngeal swab, presence of viral mutation(s) within the areas targeted by this assay, and inadequate number of viral copies(<138 copies/mL). A negative result must be combined with clinical observations, patient  history, and epidemiological information. The expected result is Negative.  Fact Sheet for Patients:  EntrepreneurPulse.com.au  Fact Sheet for Healthcare Providers:  IncredibleEmployment.be  This test is no t yet approved or cleared by the [place] FDA and  has been authorized for detection and/or diagnosis of SARS-CoV-2 by FDA under an Emergency Use Authorization (EUA). This EUA will remain  in effect (meaning this test can be used) for the duration of the COVID-19 declaration under Section 564(b)(1) of the Act, 21 U.S.C.section 360bbb-3(b)(1), unless the authorization is terminated  or revoked sooner.       Influenza A by PCR NEGATIVE NEGATIVE Final   Influenza B by PCR NEGATIVE NEGATIVE Final    Comment: (NOTE) The Xpert Xpress SARS-CoV-2/FLU/RSV plus assay is intended as an aid in the diagnosis of influenza from Nasopharyngeal swab specimens and should not be used as a sole basis for treatment. Nasal washings and aspirates are unacceptable for Xpert Xpress SARS-CoV-2/FLU/RSV testing.  Fact Sheet for Patients: EntrepreneurPulse.com.au  Fact Sheet for Healthcare Providers: IncredibleEmployment.be  This test is not yet approved or cleared by the [place] FDA and has been authorized for detection and/or diagnosis of SARS-CoV-2 by FDA under an Emergency Use Authorization (EUA). This EUA will remain in effect (meaning this test can be used) for the duration of the COVID-19 declaration under Section  564(b)(1) of the Act, 21 U.S.C. section 360bbb-3(b)(1), unless the authorization is terminated or revoked.  Performed at [facility], [place] [address], [place], [place] [address]   Culture, blood (routine x 2)     Status: Abnormal   Collection Time: 12/10/20  4:06 AM   Specimen: BLOOD RIGHT HAND  Result Value Ref Range Status   Specimen Description BLOOD RIGHT HAND  Final   Special Requests   Final    BOTTLES DRAWN AEROBIC AND ANAEROBIC Blood Culture results may not be optimal due to an inadequate volume of blood received in culture bottles   Culture  Setup Time   Final    GRAM NEGATIVE RODS IN BOTH AEROBIC AND ANAEROBIC BOTTLES IDENTIFICATION TO FOLLOW CRITICAL RESULT CALLED TO, READ BACK BY AND VERIFIED WITHJeanette Caprice PHARMD 1431 12/10/20 A BROWNING    Culture (A)  Final    ESCHERICHIA COLI SUSCEPTIBILITIES PERFORMED ON PREVIOUS CULTURE WITHIN THE LAST 5 DAYS. Performed at [facility], [place] [address], [place], [place] [address]    Report Status 12/12/2020 FINAL  Final  Urine culture     Status: Abnormal   Collection Time: 12/10/20  4:40 AM   Specimen: Urine, Clean Catch  Result Value Ref Range Status   Specimen Description URINE, CLEAN CATCH  Final   Special Requests   Final    NONE Performed at [facility], [place] [address] [place], [place] [address]    Culture >=100,000 COLONIES/mL ESCHERICHIA COLI (A)  Final   Report Status 12/12/2020 FINAL  Final   Organism ID, Bacteria ESCHERICHIA COLI (A)  Final      Susceptibility   Escherichia coli - MIC*    AMPICILLIN 8 SENSITIVE Sensitive     CEFAZOLIN <=4 SENSITIVE Sensitive     CEFEPIME <=0.12 SENSITIVE Sensitive     CEFTRIAXONE <=0.25 SENSITIVE Sensitive     CIPROFLOXACIN <=0.25 SENSITIVE Sensitive     GENTAMICIN <=1 SENSITIVE Sensitive     IMIPENEM <=0.25 SENSITIVE Sensitive     NITROFURANTOIN <=16 SENSITIVE Sensitive     TRIMETH/SULFA <=20 SENSITIVE Sensitive     AMPICILLIN/SULBACTAM <=  2 SENSITIVE  Sensitive     PIP/TAZO <=4 SENSITIVE Sensitive     * >=100,000 COLONIES/mL ESCHERICHIA COLI  MRSA PCR Screening     Status: None   Collection Time: 12/10/20 11:24 AM   Specimen: Nasal Mucosa; Nasopharyngeal  Result Value Ref Range Status   MRSA by PCR NEGATIVE NEGATIVE Final    Comment:        The GeneXpert MRSA Assay (FDA approved for NASAL specimens only), is one component of a comprehensive MRSA colonization surveillance program. It is not intended to diagnose MRSA infection nor to guide or monitor treatment for MRSA infections. Performed at [facility], [place] [address] [place], [place] [address]   Culture, Respiratory w Gram Stain     Status: None (Preliminary result)   Collection Time: 12/15/20  7:39 AM   Specimen: Tracheal Aspirate; Respiratory  Result Value Ref Range Status   Specimen Description TRACHEAL ASPIRATE  Final   Special Requests NONE  Final   Gram Stain   Final    RARE WBC PRESENT, PREDOMINANTLY PMN MODERATE GRAM NEGATIVE RODS RARE GRAM POSITIVE COCCI    Culture   Final    CULTURE REINCUBATED FOR BETTER GROWTH Performed at [facility], [address], [place], [place] [address]    Report Status PENDING  Incomplete  Culture, BAL-quantitative w Gram Stain     Status: None (Preliminary result)   Collection Time: 12/15/20  9:36 AM   Specimen: Bronchoalveolar Lavage; Respiratory  Result Value Ref Range Status   Specimen Description BRONCHIAL ALVEOLAR LAVAGE  Final   Special Requests NONE  Final   Gram Stain   Final    FEW WBC PRESENT,BOTH PMN AND MONONUCLEAR NO ORGANISMS SEEN    Culture   Final    CULTURE REINCUBATED FOR BETTER GROWTH Performed at [facility], [address] [place], [place] [address]    Report Status PENDING  Incomplete   Creatinine: Recent Labs    12/10/20 1407 12/11/20 0114 12/12/20 0446 12/13/20 0400 12/14/20 0611 12/15/20 0115 12/16/20 0457  CREATININE 5.36* 4.86* 3.89* 2.82* 2.73* 2.67* 2.94*     Xrays: See report/chart As noted- distal left ureter stone with no hydro  Impression/Assessment:  Distal left ureteral stone with UTI sepsis Cysto/left retrograde, and left stent  Plan:  As noted; spoke with OR and wife and obtained consent  [name] 12/16/2020, 11:01 AM

## 2020-12-16 NOTE — Progress Notes (Signed)
eLink Physician-Brief Progress Note Patient Name: [name] DOB: [birth year] MRN: [id number]   Date of Service  12/16/2020  HPI/Events of Note  Agitation - Nursing request for bilateral soft wrist restraint.  eICU Interventions  Will order bilateral soft wrist restraint X 8 hours.      Intervention Category Major Interventions: Delirium, psychosis, severe agitation - evaluation and management  Sahithi Ordoyne Eugene 12/16/2020, 1:44 AM

## 2020-12-17 ENCOUNTER — Encounter (HOSPITAL_COMMUNITY): Payer: Self-pay | Admitting: Urology

## 2020-12-17 LAB — CBC
HCT: 29.5 % — ABNORMAL LOW (ref 39.0–52.0)
Hemoglobin: 9.4 g/dL — ABNORMAL LOW (ref 13.0–17.0)
MCH: 32 pg (ref 26.0–34.0)
MCHC: 31.9 g/dL (ref 30.0–36.0)
MCV: 100.3 fL — ABNORMAL HIGH (ref 80.0–100.0)
Platelets: 197 10*3/uL (ref 150–400)
RBC: 2.94 MIL/uL — ABNORMAL LOW (ref 4.22–5.81)
RDW: 16.1 % — ABNORMAL HIGH (ref 11.5–15.5)
WBC: 11.5 10*3/uL — ABNORMAL HIGH (ref 4.0–10.5)
nRBC: 0 % (ref 0.0–0.2)

## 2020-12-17 LAB — COMPREHENSIVE METABOLIC PANEL
ALT: 15 U/L (ref 0–44)
AST: 42 U/L — ABNORMAL HIGH (ref 15–41)
Albumin: 2.1 g/dL — ABNORMAL LOW (ref 3.5–5.0)
Alkaline Phosphatase: 99 U/L (ref 38–126)
Anion gap: 7 (ref 5–15)
BUN: 110 mg/dL — ABNORMAL HIGH (ref 6–20)
CO2: 24 mmol/L (ref 22–32)
Calcium: 8 mg/dL — ABNORMAL LOW (ref 8.9–10.3)
Chloride: 119 mmol/L — ABNORMAL HIGH (ref 98–111)
Creatinine, Ser: 2.34 mg/dL — ABNORMAL HIGH (ref 0.61–1.24)
GFR, Estimated: 31 mL/min — ABNORMAL LOW (ref >60)
Glucose, Bld: 154 mg/dL — ABNORMAL HIGH (ref 70–99)
Potassium: 4.2 mmol/L (ref 3.5–5.1)
Sodium: 150 mmol/L — ABNORMAL HIGH (ref 135–145)
Total Bilirubin: 0.8 mg/dL (ref 0.3–1.2)
Total Protein: 6 g/dL — ABNORMAL LOW (ref 6.5–8.1)

## 2020-12-17 LAB — GLUCOSE, CAPILLARY
Glucose-Capillary: 124 mg/dL — ABNORMAL HIGH (ref 70–99)
Glucose-Capillary: 120 mg/dL — ABNORMAL HIGH (ref 70–99)
Glucose-Capillary: 68 mg/dL — ABNORMAL LOW (ref 70–99)
Glucose-Capillary: 95 mg/dL (ref 70–99)
Glucose-Capillary: 56 mg/dL — ABNORMAL LOW (ref 70–99)
Glucose-Capillary: 63 mg/dL — ABNORMAL LOW (ref 70–99)
Glucose-Capillary: 84 mg/dL (ref 70–99)
Glucose-Capillary: 121 mg/dL — ABNORMAL HIGH (ref 70–99)
Glucose-Capillary: 119 mg/dL — ABNORMAL HIGH (ref 70–99)
Glucose-Capillary: 189 mg/dL — ABNORMAL HIGH (ref 70–99)

## 2020-12-17 LAB — CULTURE, BAL-QUANTITATIVE W GRAM STAIN: Culture: 3000 — AB

## 2020-12-17 LAB — PHOSPHORUS: Phosphorus: 5.5 mg/dL — ABNORMAL HIGH (ref 2.5–4.6)

## 2020-12-17 LAB — TRIGLYCERIDES: Triglycerides: 230 mg/dL — ABNORMAL HIGH (ref <150)

## 2020-12-17 LAB — CULTURE, RESPIRATORY W GRAM STAIN: Culture: NORMAL

## 2020-12-17 LAB — MAGNESIUM: Magnesium: 2.6 mg/dL — ABNORMAL HIGH (ref 1.7–2.4)

## 2020-12-17 MED ORDER — DEXTROSE 50 % IV SOLN: 12.5000 g | Freq: Once | INTRAVENOUS | Status: AC

## 2020-12-17 MED ORDER — QUETIAPINE FUMARATE 25 MG PO TABS
25.0000 mg | ORAL_TABLET | Freq: Two times a day (BID) | ORAL | Status: DC
  Administered 2020-12-17 – 2020-12-23 (×14): 25 mg
  Filled 2020-12-17 (×15): qty 1

## 2020-12-17 MED ORDER — DEXTROSE 10 % IV SOLN: INTRAVENOUS | Status: DC

## 2020-12-17 MED ORDER — DEXTROSE 50 % IV SOLN
INTRAVENOUS | Status: AC
  Administered 2020-12-17: 12.5 g via INTRAVENOUS
  Filled 2020-12-17: qty 50

## 2020-12-17 MED ORDER — DEXTROSE 50 % IV SOLN
INTRAVENOUS | Status: AC
  Filled 2020-12-17: qty 50

## 2020-12-17 MED ORDER — DEXTROSE 50 % IV SOLN
25.0000 g | Freq: Once | INTRAVENOUS | Status: AC
  Administered 2020-12-17: 25 g via INTRAVENOUS

## 2020-12-17 MED ORDER — OXYCODONE HCL 5 MG/5ML PO SOLN
7.5000 mg | Freq: Four times a day (QID) | ORAL | Status: DC
  Administered 2020-12-17 – 2020-12-23 (×23): 7.5 mg
  Filled 2020-12-17 (×23): qty 10

## 2020-12-17 MED ORDER — MIDAZOLAM HCL 2 MG/2ML IJ SOLN
1.0000 mg | INTRAMUSCULAR | Status: DC | PRN
  Administered 2020-12-18 (×7): 2 mg via INTRAVENOUS
  Filled 2020-12-17 (×7): qty 2

## 2020-12-17 MED ORDER — CLONAZEPAM 0.5 MG PO TBDP
0.5000 mg | ORAL_TABLET | Freq: Two times a day (BID) | ORAL | Status: DC
  Administered 2020-12-17 – 2020-12-18 (×4): 0.5 mg via ORAL
  Filled 2020-12-17 (×4): qty 1

## 2020-12-17 MED ORDER — SODIUM CHLORIDE 0.9 % IV SOLN
2.0000 g | INTRAVENOUS | Status: AC
  Administered 2020-12-17 – 2020-12-21 (×5): 2 g via INTRAVENOUS
  Filled 2020-12-17 (×5): qty 20

## 2020-12-17 MED ORDER — FREE WATER
300.0000 mL | Status: DC
  Administered 2020-12-17 – 2020-12-18 (×6): 300 mL

## 2020-12-17 MED ORDER — INSULIN DETEMIR 100 UNIT/ML ~~LOC~~ SOLN
25.0000 [IU] | Freq: Two times a day (BID) | SUBCUTANEOUS | Status: DC
  Administered 2020-12-17 – 2020-12-20 (×6): 25 [IU] via SUBCUTANEOUS
  Filled 2020-12-17 (×7): qty 0.25

## 2020-12-17 MED ORDER — FENTANYL BOLUS VIA INFUSION
100.0000 ug | INTRAVENOUS | Status: DC | PRN
Start: 2020-12-17 — End: 2020-12-23
  Administered 2020-12-18 – 2020-12-22 (×14): 100 ug via INTRAVENOUS
  Filled 2020-12-17: qty 100

## 2020-12-17 NOTE — Progress Notes (Signed)
NAME:  [name], MRN:  [id number], DOB:  1961/02/26, LOS: 7 ADMISSION DATE:  12/10/2020,   History of Present Illness:  This is a 60 year old white male who presented to the emergency room from home.  The patient's wife explains that the patient's had severe diarrhea that started approximately 4 to 5 days ago.  He had also had vomiting that started approximately 24 hours after the diarrhea.  Over the past 24 hours patient has become more confused and at times belligerent.  Patient's wife states that she noticed he became very cold to touch.  EMS was called patient was brought to the emergency room for further evaluation.  While in the emergency room patient found to be hypotensive, combative, confused, and was intubated.  He was noted to have mottling over his trunk and extremities.  Admitted for hypoxemic respiratory failure and septic shock 2/2 E. Coli bacteremia and UTI, and ATN.   Pertinent  Medical History  Prior smoker quit 15 years ago 82-100-pack-year history Sinusitis  Chronic pain syndrome Hypertension Hyperlipidemia Chronic back pain  Significant Hospital Events: Including procedures, antibiotic start and stop dates in addition to other pertinent events   . 12/10/2020 patient was intubated . 12/10/2020 triple-lumen catheter inserted . 12/11/2020 off pressors. E coli bacteremia, e coli uti. Some improvement in pulm opacities on CXR. On rocephin  . 4/5 extubated, failed immediately due to mucous plugging identified on bronch after re-intubated.  Marland Kitchen 4/6 broadened to cefepime in light of pneumonia on CT abdomen; urology consulted for distal left ureteral stone-> OR overnight for stent placement, UCx sent  Interim History / Subjective:  OR overnight for for left ureteral stone s/p stent placement  Versed gtt added due to ongoing agitation, remains on fentanyl 250 and precedex 1.2 tmax 100.2, WBC 12.3-> 11.5 UOP 5.8L/ 24hr, ~1.7 ml/kg/hr  Objective   Blood pressure (!) 104/54,  pulse 77, temperature 100.2 F (37.9 C), temperature source Esophageal, resp. rate (!) 24, height _0  (1.88 m), weight (!) 145.1 kg, SpO2 99 %.    Vent Mode: PSV;CPAP FiO2 (%):  [40 %] 40 % Set Rate:  [26 bmp] 26 bmp Vt Set:  [660 mL] 660 mL PEEP:  [5 cmH20] 5 cmH20 Pressure Support:  [10 cmH20] 10 cmH20 Plateau Pressure:  [9 cmH20-25 cmH20] 25 cmH20   Intake/Output Summary (Last 24 hours) at 12/17/2020 4259 Last data filed at 12/17/2020 0800 Gross per 24 hour  Intake 4544.14 ml  Output 7400 ml  Net -2855.86 ml   Filed Weights   12/15/20 0240 12/16/20 0245 12/17/20 0319  Weight: (!) 144.6 kg (!) 145.5 kg (!) 145.1 kg    Examination:  General: Older obese male on MV in NAD HEENT: MM pink/moist, pupils 3/reactive, ETT (w/bite block), OGT Neuro: Will open eyes to voice and follow commands, MAE- requiring B wrist restraints CV: NSR PULM:  Non labored, currently on 10/5 PSV doing well, minimal secretions, lungs clear, diminished in bases GI: round, obese, soft, +bs, foley/ rectal pouch  Extremities: warm/dry, generalized edema 1-2+ Skin: no rashes   850 ml/ 24 hr out in stool/ rectal pouch -1.8L/ net +1.2L  Labs/imaging that I have personally reviewed  (right click and "Reselect all SmartList Selections" daily)  4/1 TTE >> EF 40-45%, LV global hypokinesis, G2DD, normal RV, dilated aortic root, 41 mm  CT abd 4/5 > 23m left ureteral stone with mild edema of the L kidney. Large areas of consolidation in bilateral lung bases.    4/7, CMET/  CBC/ culture data   Resolved Hospital Problem list     Assessment & Plan:  Acute hypercarbic respiratory failure inability to compensate for increased metabolic demand; question aspiration pneumonitis. Dense B pneumonia noted on CT abdomen, R>L - failed extubation secondary to secretions 4/5 requiring reintubation  -Continue MV support, 4-8cc/kg IBW with goal Pplat <30 and DP<15  -VAP prevention protocol/ PPI -PAD protocol for sedation>  fentanyl/ precedex, adding enteral sedation, low dose klonopin and Seroquel, monitor QTc (today 484), increased oxy from 5 to 7.5 q6hr, RASS goal 0/-1; clonidine taper considered but BP will not support this  -wean FiO2 as able for SpO2 >92%  -daily SAT & SBT- hold on extubation today  - 4/5 BAL neg thus far, follow trach asp - abx as below   Acute renal failure: septic ATN, pylenephritis. L ureteral stone w/o hydronephrosis.  Hypernatremia  - s/p left ureteral stent placement by Urology 4/6 - UOP picking up and improving sCr, will hold on nephrology consult for now - increase free water, 300 ml q6hr increased to q 4hr - continue to trend renal indices/ strict I/Os  Septic shock due to E coli UTI with bacteremia, pyelonephritis, pneumonia - s/p ureteral stent placement 4/6  - resolving, remains hemodynamically stable, no pressors but soft  - continue cefepime  - follow cultures/ BAL/ UC 4/6 - will try and get more PIV to d/c CVL  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 [place] - LFTs up a bit suspect congestive, INR okay - LFTs stable, trend   DM2 with hyperglycemia - continue SSI moderate - levemir 30 units BID - TF coverage - glucose trend remains within goal   Sepsis-associated DIC - resolving   Stress cardiomyopathy- w/ type 2 NSTEMI, would benefit from repeating as OP to make sure not masking ischemic heart disease  Traumatic urethral bleeding after foley manipulation by patient - Self-resolved, foley replaced, monitor  Distal left ureteral stone - s/p stent placement 4/6 by Dr. Matilde Sprang w/Urology  - will need urology f/u oputpatient   AAA infrarenal 4.8 cm - serial CT follow up outpatient   Chronic pain - on home oxy, continue  Best practice (right click and "Reselect all SmartList Selections" daily)  Diet:  Tube Feed  Pain/Anxiety/Delirium protocol (if indicated): Yes (RASS goal -1) VAP protocol (if indicated): Yes DVT prophylaxis: SCD GI prophylaxis: PPI Glucose control:   See above Central venous access:  Yes, and it is still needed, till PIV access established Arterial line:  N/A Foley:  Yes, and it is still needed, will need to clarify with urology ok to remove Mobility:  bed rest  PT consulted: N/A Last date of multidisciplinary goals of care discussion, no family at bedside 4/7 am.  Code Status:  full code Disposition: ICU  Critical care time:  45 minutes   Kennieth Rad, ACNP [facility] 12/17/2020, 8:21 AM See Amion for pager

## 2020-12-17 NOTE — Evaluation (Signed)
Physical Therapy Evaluation Patient Details Name: [name] MRN: [id number] DOB: 24-Jun-1961 Today's Date: 12/17/2020   History of Present Illness  60 year old white male who presented to the emergency room from home 12/10/20.  Pt had 4-5 days of N/V/D. Pt became more confused and at times belligerent. Admitted for hypoxemic respiratory failure and septic shock 2/2 E. Coli bacteremia and UTI and was intubated. Failed extubation 4/5 due to mucous plugging and reintubated. 4/6 to OR left ureteral stent and cystourethroscopy.  Clinical Impression   Pt admitted secondary to problem above with deficits below. PTA patient was apparently independent (per recent medical records was completing home construction repairs).  Pt currently requires 2 person assist for all mobility and remains ventilator dependent. Unclear how quickly pt will advance to extubation, therefore difficult to assess discharge needs and venue at this time. Will continue to follow acutely to maximize functional mobility independence and safety.       Follow Up Recommendations Other (comment) (TBA as pt progresses off ventilator)    Equipment Recommendations  Other (comment) (TBA as progresses off ventilator and discharge disposition becomes more clear)    Recommendations for Other Services       Precautions / Restrictions Precautions Precautions: Fall;Other (comment) Precaution Comments: ETT, rectal tube, foley      Mobility  Bed Mobility               General bed mobility comments: Elevated into chair position in ICU bed (not to full egress position) with pt becoming very restless and grimacing. Able to nod yes to low back pain. Tolerated ~4 minutes with some adjustments in position.    Transfers                    Ambulation/Gait                Stairs            Wheelchair Mobility    Modified Rankin (Stroke Patients Only)       Balance                                              Pertinent Vitals/Pain Pain Assessment: Faces Faces Pain Scale: Hurts whole lot Pain Location: back, and generalized Pain Descriptors / Indicators: Restless;Grimacing Pain Intervention(s): Limited activity within patient's tolerance;Monitored during session    Home Living Family/patient expects to be discharged to:: Unsure                 Additional Comments: Pt lived with wife at home per chart.    Prior Function Level of Independence: Independent         Comments: per chart review, pt was ambulating independently, and in August was working on a house doing [employer]ance   Dominant Hand: Right    Extremity/Trunk Assessment   Upper Extremity Assessment Upper Extremity Assessment: Defer to OT evaluation    Lower Extremity Assessment Lower Extremity Assessment: Overall WFL for tasks assessed (moving bil LEs spontaneously and intermittently followed commands)    Cervical / Trunk Assessment Cervical / Trunk Assessment: Other exceptions Cervical / Trunk Exceptions: obese  Communication   Communication: Other (comment) (ETT)  Cognition Arousal/Alertness: Awake/alert Behavior During Therapy: Anxious;Impulsive;Restless Overall Cognitive Status: Difficult to assess  General Comments: Pt awake, with ETT on Fentanyl, and precedex.  He will intermittently nod and shake head in response to questions.  He is very restless, with cuing/encouragement, he followed simple one step commands ~25% of the time      General Comments      Exercises Other Exercises Other Exercises: Pt moved into chair position in the bed with an increase in restlessness.  He does indicate pain.   Assessment/Plan    PT Assessment Patient needs continued PT services  PT Problem List Decreased activity tolerance;Decreased balance;Decreased mobility;Decreased knowledge of use of DME;Decreased safety  awareness;Cardiopulmonary status limiting activity;Obesity;Pain       PT Treatment Interventions DME instruction;Gait training;Functional mobility training;Therapeutic activities;Therapeutic exercise;Balance training;Cognitive remediation;Patient/family education    PT Goals (Current goals can be found in the Care Plan section)  Acute Rehab PT Goals Patient Stated Goal: pt unable PT Goal Formulation: Patient unable to participate in goal setting Time For Goal Achievement: 12/31/20 Potential to Achieve Goals: Fair    Frequency Min 3X/week   Barriers to discharge        Co-evaluation PT/OT/SLP Co-Evaluation/Treatment: Yes Reason for Co-Treatment: Complexity of the patient's impairments (multi-system involvement);Necessary to address cognition/behavior during functional activity;For patient/therapist safety PT goals addressed during session: Mobility/safety with mobility;Strengthening/ROM         AM-PAC PT "6 Clicks" Mobility  Outcome Measure Help needed turning from your back to your side while in a flat bed without using bedrails?: A Lot Help needed moving from lying on your back to sitting on the side of a flat bed without using bedrails?: Total Help needed moving to and from a bed to a chair (including a wheelchair)?: Total Help needed standing up from a chair using your arms (e.g., wheelchair or bedside chair)?: Total Help needed to walk in hospital room?: Total Help needed climbing 3-5 steps with a railing? : Total 6 Click Score: 7    End of Session Equipment Utilized During Treatment: Oxygen Activity Tolerance: Patient limited by pain Patient left: in bed;with nursing/sitter in room;with restraints reapplied (bil wrist restraints) Nurse Communication: Other (comment) (demonstrating back pain) PT Visit Diagnosis: Difficulty in walking, not elsewhere classified (R26.2)    Time: 1004-1030 PT Time Calculation (min) (ACUTE ONLY): 26 min   Charges:   PT Evaluation $PT  Eval Moderate Complexity: 1 Mod           Jerolyn Center, PT Pager [phone]   Zena Amos 12/17/2020, 3:05 PM

## 2020-12-17 NOTE — Anesthesia Postprocedure Evaluation (Signed)
Anesthesia Post Note  Patient: [name]  Procedure(s) Performed: CYSTOSCOPY WITH RETROGRADE PYELOGRAM/URETERAL STENT PLACEMENT (Left Ureter)     Patient location during evaluation: ICU Anesthesia Type: General Level of consciousness: patient remains intubated per anesthesia plan Pain management: pain level controlled Vital Signs Assessment: post-procedure vital signs reviewed and stable Respiratory status: patient remains intubated per anesthesia plan Cardiovascular status: stable Postop Assessment: no apparent nausea or vomiting Anesthetic complications: no   No complications documented.  Last Vitals:  Vitals:   12/16/20 2021 12/16/20 2324  BP:    Pulse:    Resp:    Temp:  (!) 36.3 C  SpO2: 100%     Last Pain:  Vitals:   12/16/20 2324  TempSrc: Axillary  PainSc:                  [name]

## 2020-12-17 NOTE — Progress Notes (Signed)
Patient stable  Stent in no issues F/up with Dr Sherron Monday Urology as outpatient

## 2020-12-17 NOTE — Progress Notes (Signed)
Hypoglycemic Event  CBG: 68  Treatment: 12.5 G Dextrose 50%  Symptoms: None  Follow-up CBG: Time: 1154 CBG Result:98  Possible Reasons for Event: Unknown    Darryl Nestle

## 2020-12-17 NOTE — Progress Notes (Signed)
Hypoglycemic Event  CBG: 56  Treatment: 1 amp Dextrose 50%  Symptoms: None  Follow-up CBG: Time: 1632 CBG Result:121  Possible Reasons for Event: Insulin coverage Comments/MD notified: Dr. Katrinka Blazing notified - see MAR for changes.    [name]

## 2020-12-17 NOTE — Progress Notes (Signed)
Nutrition Follow-up  DOCUMENTATION CODES:   Morbid obesity  INTERVENTION:   Continue tube feeding via OG tube: Vital AF 1.2 at 65 ml/h (1560 ml per day) Prosource TF 90 ml TID  Provides 2112 kcal, 183 gm protein, 1265 ml free water daily.  NUTRITION DIAGNOSIS:   Inadequate oral intake related to inability to eat as evidenced by NPO status.  Ongoing   GOAL:   Provide needs based on ASPEN/SCCM guidelines  Met with TF  MONITOR:   Vent status,Labs,TF tolerance  REASON FOR ASSESSMENT:   Ventilator,Consult Enteral/tube feeding initiation and management  ASSESSMENT:   59 yo male admitted with acute metabolic encephalopathy, acute respiratory failure, septic shock, E coli bacteremia, E coli UTI. PMH includes recent severe diarrhea and vomiting, chronic pain, HTN, HLD.   Failed extubation d/t secretions 4/5. S/P ureteral stent placement 4/6.  UOP improving.   Patient is currently receiving Vital AF 1.2 via OGT at 65 ml/h with Prosource TF 90 ml TID. Tolerating well. Free water flushes added today: 300 ml q 4 hours.   Patient remains intubated on ventilator support MV: 16.4 L/min Temp (24hrs), Avg:99.2 F (37.3 C), Min:97.3 F (36.3 C), Max:100.2 F (37.9 C)   Labs reviewed. Na 150, phosphorus 5.5 CBG: 124-120  Medications reviewed and include colace, novolog, levemir, miralax, precedex, IV antibiotics. IVF: LR at 125 ml/h  I/O +1.3 L since admission UOP 5,875 ml x 24 hours Stool output 850 ml x 24 hours Mild-moderate pitting edema present to BUE and BLE per RN assessment.   Diet Order:   Diet Order            Diet NPO time specified  Diet effective now                 EDUCATION NEEDS:   Not appropriate for education at this time  Skin:  Skin Assessment: Reviewed RN Assessment  Last BM:  4/6 rectal tube  Height:   Ht Readings from Last 1 Encounters:  12/10/20 6' 2" (1.88 m)    Weight:   Wt Readings from Last 1 Encounters:  12/17/20 (!)  145.1 kg    Ideal Body Weight:  86.4 kg  BMI:  Body mass index is 41.07 kg/m.  Estimated Nutritional Needs:   Kcal:  1725-2100  Protein:  173-216 gm  Fluid:  >/= 2 L     H, RD, LDN, CNSC Please refer to Amion for contact information.                                                        

## 2020-12-17 NOTE — Evaluation (Signed)
Occupational Therapy Evaluation Patient Details Name: [name] MRN: [id number] DOB: 09-Nov-1960 Today's Date: 12/17/2020    History of Present Illness 60 year old white male who presented to the emergency room from home.  The patient's wife explains that the patient's had severe diarrhea that started approximately 4 to 5 days ago.  He had also had vomiting that started approximately 24 hours after the diarrhea.  Over the past 24 hours patient has become more confused and at times belligerent. Brought to ED 12/10/20 and  admitted for hypoxemic respiratory failure and septic shock 2/2 E. Coli bacteremia and UTI and was intubated. Failed extubation 4/5 due to mucous plugging and reintubated. 4/6 to OR left ureteral stent and cystourethroscopy.   Clinical Impression   Pt admitted with above. He demonstrates the below listed deficits and will benefit from continued OT to maximize safety and independence with BADLs.  Pt presents to OT with impaired cognition, decreased activity tolerance, and increased pain.  He currently requires total A for ADLs and unable to formally assess mobility due to restlessness and risk for pulling/dislodging lines/tubes.  He was able to tolerate chair position x4 mins but definitely appeared uncomfortable.   Per chart review, pt lived with wife at home, and it appears he was fully independent.  It is too early to accurately make recommendations for discharge disposition therefore, will continue to assess.  Will follow acutely.       Follow Up Recommendations  Other (comment) (too early to determine.  Much will depend on pt's ability to wean from vent)    Equipment Recommendations  None recommended by OT    Recommendations for Other Services       Precautions / Restrictions Precautions Precautions: Fall;Other (comment) Precaution Comments: ETT, rectal tube, foley      Mobility Bed Mobility                    Transfers                       Balance                                           ADL either performed or assessed with clinical judgement   ADL Overall ADL's : Needs assistance/impaired                                       General ADL Comments: Pt currently requires total A for all due to restlessness, and difficulty fully following commands     Vision         Perception     Praxis      Pertinent Vitals/Pain Pain Assessment: Faces Faces Pain Scale: Hurts whole lot Pain Location: back, and generalized Pain Descriptors / Indicators: Restless;Grimacing Pain Intervention(s): Monitored during session;Limited activity within patient's tolerance;Repositioned     Hand Dominance Right   Extremity/Trunk Assessment Upper Extremity Assessment Upper Extremity Assessment: LUE deficits/detail;RUE deficits/detail RUE Deficits / Details: Pt moving bil. UEs spontaneously, but bil. UEs restrained due pt very anxious and restless and is at risk for pulling at, or dislodging Tubes/lines.  Min edema noted bil. UEs LUE Deficits / Details: Pt moving bil. UEs spontaneously, but bil. UEs restrained due pt very anxious and restless and is at risk for  pulling at, or dislodging Tubes/lines.  Min edema noted bil. UEs   Lower Extremity Assessment Lower Extremity Assessment: Defer to PT evaluation       Communication Communication Communication: Other (comment) (ETT)   Cognition Arousal/Alertness: Awake/alert Behavior During Therapy: Anxious;Impulsive;Restless Overall Cognitive Status: Difficult to assess                                 General Comments: Pt awake, with ETT on Fentanyl, and precedex.  He will intermittently nod and shake head in response to questions.  He is very restless, with cuing/encouragement, he followed simple one step commands ~25% of the time   General Comments  VSS with pt on 40% Fi02 5 PEEP    Exercises Exercises: Other exercises Other  Exercises Other Exercises: Pt moved into chair position in the bed with an increase in restlessness.  He does indicate pain.   Shoulder Instructions      Home Living Family/patient expects to be discharged to:: Unsure                                 Additional Comments: Pt lived with wife at home per chart.      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        OT Problem List: Decreased activity tolerance;Impaired balance (sitting and/or standing);Decreased cognition;Decreased safety awareness;Decreased knowledge of use of DME or AE;Decreased knowledge of precautions;Cardiopulmonary status limiting activity;Pain      OT Treatment/Interventions: Self-care/ADL training;DME and/or AE instruction;Therapeutic activities;Patient/family education;Balance training;Therapeutic exercise    OT Goals(Current goals can be found in the care plan section) Acute Rehab OT Goals Patient Stated Goal: pt unable OT Goal Formulation: Patient unable to participate in goal setting Time For Goal Achievement: 12/31/20 Potential to Achieve Goals: Good ADL Goals Pt Will Perform Grooming: with supervision;with set-up;sitting Pt Will Perform Upper Body Bathing: with set-up;with supervision;sitting Pt Will Perform Lower Body Bathing: with mod assist;sit to/from stand Pt Will Transfer to Toilet: with mod assist;with +2 assist;stand pivot transfer;bedside commode Pt Will Perform Toileting - Clothing Manipulation and hygiene: with mod assist;sit to/from stand Additional ADL Goal #1: Pt will actively participate in 20 mins therapeutic activity with VSS and no more than 2 rest breaks  OT Frequency: Min 2X/week   Barriers to D/C:            Co-evaluation PT/OT/SLP Co-Evaluation/Treatment: Yes Reason for Co-Treatment: For  patient/therapist safety;Necessary to address cognition/behavior during functional activity;Complexity of the patient's impairments (multi-system involvement)   OT goals addressed during session: Strengthening/ROM      AM-PAC OT "6 Clicks" Daily Activity     Outcome Measure Help from another person eating meals?: Total Help from another person taking care of personal grooming?: Total Help from another person toileting, which includes using toliet, bedpan, or urinal?: Total Help from another person bathing (including washing, rinsing, drying)?: Total Help from another person to put on and taking off regular upper body clothing?: Total Help from another person to put on and taking off regular lower body clothing?: Total 6 Click Score: 6   End of Session Equipment Utilized During Treatment: Oxygen Nurse Communication: Mobility status  Activity Tolerance: Treatment limited secondary to agitation Patient left: in bed;with call  bell/phone within reach;with bed alarm set;with nursing/sitter in room;with restraints reapplied  OT Visit Diagnosis: Unsteadiness on feet (R26.81);Cognitive communication deficit (R41.841);Pain;Muscle weakness (generalized) (M62.81) Pain - part of body:  (back)                Time: 1004-1030 OT Time Calculation (min): 26 min Charges:  OT General Charges $OT Visit: 1 Visit OT Evaluation $OT Eval Moderate Complexity: 1 Mod  Eber Jones., OTR/L Acute Rehabilitation Services Pager [phone] Office [phone]   Jeani Hawking M 12/17/2020, 10:51 AM

## 2020-12-18 DIAGNOSIS — Z9911 Dependence on respirator [ventilator] status: Secondary | ICD-10-CM

## 2020-12-18 LAB — GLUCOSE, CAPILLARY
Glucose-Capillary: 114 mg/dL — ABNORMAL HIGH (ref 70–99)
Glucose-Capillary: 124 mg/dL — ABNORMAL HIGH (ref 70–99)
Glucose-Capillary: 174 mg/dL — ABNORMAL HIGH (ref 70–99)
Glucose-Capillary: 110 mg/dL — ABNORMAL HIGH (ref 70–99)
Glucose-Capillary: 120 mg/dL — ABNORMAL HIGH (ref 70–99)
Glucose-Capillary: 131 mg/dL — ABNORMAL HIGH (ref 70–99)

## 2020-12-18 LAB — URINE CULTURE: Culture: NO GROWTH

## 2020-12-18 LAB — POCT I-STAT 7, (LYTES, BLD GAS, ICA,H+H)
Acid-base deficit: 3 mmol/L — ABNORMAL HIGH (ref 0.0–2.0)
Bicarbonate: 20.8 mmol/L (ref 20.0–28.0)
Calcium, Ion: 1.27 mmol/L (ref 1.15–1.40)
HCT: 28 % — ABNORMAL LOW (ref 39.0–52.0)
Hemoglobin: 9.5 g/dL — ABNORMAL LOW (ref 13.0–17.0)
O2 Saturation: 94 %
Potassium: 4.6 mmol/L (ref 3.5–5.1)
Sodium: 156 mmol/L — ABNORMAL HIGH (ref 135–145)
TCO2: 22 mmol/L (ref 22–32)
pCO2 arterial: 33.3 mmHg (ref 32.0–48.0)
pH, Arterial: 7.404 (ref 7.350–7.450)
pO2, Arterial: 69 mmHg — ABNORMAL LOW (ref 83.0–108.0)

## 2020-12-18 MED ORDER — THIAMINE HCL 100 MG PO TABS
100.0000 mg | ORAL_TABLET | Freq: Every day | ORAL | Status: DC
  Administered 2020-12-18 – 2020-12-28 (×11): 100 mg
  Filled 2020-12-18 (×11): qty 1

## 2020-12-18 MED ORDER — SODIUM CHLORIDE 3 % IN NEBU
4.0000 mL | INHALATION_SOLUTION | Freq: Two times a day (BID) | RESPIRATORY_TRACT | Status: AC
  Administered 2020-12-18 – 2020-12-20 (×6): 4 mL via RESPIRATORY_TRACT
  Filled 2020-12-18 (×6): qty 4

## 2020-12-18 MED ORDER — GUAIFENESIN 100 MG/5ML PO SOLN
10.0000 mL | Freq: Three times a day (TID) | ORAL | Status: AC
  Administered 2020-12-18 – 2020-12-20 (×6): 200 mg
  Filled 2020-12-18 (×5): qty 5
  Filled 2020-12-18 (×3): qty 10
  Filled 2020-12-18: qty 5

## 2020-12-18 MED ORDER — FREE WATER
200.0000 mL | Status: DC
  Administered 2020-12-18 – 2020-12-19 (×14): 200 mL

## 2020-12-18 MED ORDER — ADULT MULTIVITAMIN LIQUID CH
15.0000 mL | Freq: Every day | ORAL | Status: DC
  Administered 2020-12-18 – 2020-12-28 (×10): 15 mL
  Filled 2020-12-18 (×11): qty 15

## 2020-12-18 MED ORDER — FOLIC ACID 1 MG PO TABS
1.0000 mg | ORAL_TABLET | Freq: Every day | ORAL | Status: DC
  Administered 2020-12-18 – 2020-12-28 (×10): 1 mg
  Filled 2020-12-18 (×10): qty 1

## 2020-12-18 MED ORDER — VITAMIN B-12 100 MCG PO TABS
100.0000 ug | ORAL_TABLET | Freq: Every day | ORAL | Status: DC
  Administered 2020-12-18 – 2021-01-13 (×26): 100 ug
  Filled 2020-12-18 (×28): qty 1

## 2020-12-18 NOTE — Progress Notes (Addendum)
NAME:  [name], MRN:  [id number], DOB:  1961-02-22, LOS: 8 ADMISSION DATE:  12/10/2020,   History of Present Illness:  60 year old male who presented 3/31 to the ER from home with 4-5 day hx of diarrhea and vomiting.  He developed confusion and became belligerent at home. Initial ER evaluation found him to be hypotensive, combative, confused, and was intubated.  He was noted to have mottling over his trunk and extremities.  Admitted for hypoxemic respiratory failure and septic shock 2/2 E. Coli bacteremia and UTI, and ATN.   Pertinent  Medical History  Prior smoker quit 15 years ago 82-100-pack-year history Sinusitis  Chronic pain syndrome Hypertension Hyperlipidemia Chronic back pain  Significant Hospital Events: Including procedures, antibiotic start and stop dates in addition to other pertinent events   . 03/31 patient was intubated . 03/31 triple-lumen catheter inserted . 4/1 off pressors. E coli bacteremia, e coli uti. Some improvement in pulm opacities on CXR. On rocephin. TTE with LVEF 40-45%, LV global hypokinesis, G2DD, normal RV, dilated aortic root at 2m.   . 4/5 extubated, failed immediately due to mucous plugging identified on bronch after re-intubated. CT ABD with 464mleft ureteral stone with mild edema of left kideny, large areas of consolidation in bilateral lung bases.  . 4/6 broadened to cefepime in light of pneumonia on CT abdomen; urology consulted for distal left ureteral stone, to OR overnight for stent placement, UCx sent.  Versed added for agitation (on fent / precedex) . 4/7 Off vasopressors  Interim History / Subjective:  Tmax 100.7 Vent - PEEP 5, FiO2 40% Glucose range 114-189 UOP 7L, -1.7L in last 24 hours  RT reports ongoing thick secretions from ETT  Objective   Blood pressure (!) 138/59, pulse 64, temperature 99.1 F (37.3 C), temperature source Axillary, resp. rate (!) 26, height _0  (1.88 m), weight (!) 145.1 kg, SpO2 100 %.    Vent Mode:  PRVC FiO2 (%):  [40 %] 40 % Set Rate:  [26 bmp] 26 bmp Vt Set:  [6[836L] 660 mL PEEP:  [5 cmH20] 5 cmH20 Plateau Pressure:  [22 cmH20-29 cmH20] 23 cmH20   Intake/Output Summary (Last 24 hours) at 12/18/2020 0847 Last data filed at 12/18/2020 0800 Gross per 24 hour  Intake 5676.48 ml  Output 6435 ml  Net -758.52 ml   Filed Weights   12/15/20 0240 12/16/20 0245 12/17/20 0319  Weight: (!) 144.6 kg (!) 145.5 kg (!) 145.1 kg    Examination:  General: adult male lying in bed on vent in NAD  HEENT: MM pink/moist, ETT, pupils 59m29m/reactive  Neuro: opens eyes to voice, sedate CV: s1s2 RRR, SR 60's, no m/r/g PULM: non-labored on vent, lungs bilaterally clear GI: protuberant, soft, bsx4 active  Extremities: warm/dry, trace to 1+ edema  Skin: no rashes or lesions      Resolved Hospital Problem list   Sepsis Associated DIC Traumatic Urrethral Bleeding  Assessment & Plan:   Septic Shock due to E coli UTI with Bacteremia, Pyelonephritis, Pneumonia S/p ureteral stent placement 4/6 -continue ceftriaxone  -ICU monitoring of hemodynamics -difficult IV access, keep central line for now  -D9/x abx   Acute Hypercarbic Respiratory Failure In setting of sepsis/bacteremia with inability to compensate for increased metabolic demands, increased secretions with poor clearance.  Dense bilateral infiltrates noted on CT abdomen R>L.  Failed extubation 4/5, reintubated with increased secretions / inability to clear. BAL negative from 4/5.  -PRVC 8cc/kg -wean PEEP / fiO2 for sats >90% -assess  ABG to ensure not over ventilating  -daily SBT / WUA, secretions remain barrier for extubation  -add hypertonic saline neb + guaifenesin for secretions -follow intermittent CXR  -VAP prevention measures -abx as below   -PAD protocol with precedex + fentanyl gtt, PRN versed   Acute Renal Failure  Hypernatremia  In setting of sepsis, ATN, pyelonephritis.  L ureteral stone w/o hydronephrosis s/p ureteral  stent placement by Urology 4/6.  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  If needed, could add TF / "meal" coverage   Stress Cardiomyopathy w/ type 2 NSTEMI -follow up as outpatient with Cardiology  Distal Left Rreteral Stone S/p stent placement 4/6 by Dr. 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 -follow up with CT as outpatient   Chronic Pain -continue home oxycodone   Best practice (right click and "Reselect all SmartList Selections" daily)  Diet:  Tube Feed  Pain/Anxiety/Delirium protocol (if indicated): Yes (RASS goal -1) VAP protocol (if indicated): Yes DVT prophylaxis: SCD GI prophylaxis: PPI Glucose control:  See above Central venous access:  Yes, and it is still needed Arterial line:  N/A Foley:  Yes, and it is still needed   Mobility:  bed rest  PT consulted: N/A Last date of multidisciplinary goals of care discussion:  Code Status:  full code Disposition: ICU    Critical care time: 66 minutes  Noe Gens, MSN, APRN, NP-C, AGACNP-BC [facility] 12/18/2020, 9:07 AM   Please see [email or web address] for pager details.   From 7A-7P if no response, please call [phone] After hours, please call ELink [phone]

## 2020-12-19 ENCOUNTER — Inpatient Hospital Stay (HOSPITAL_COMMUNITY): Payer: Medicaid Other

## 2020-12-19 DIAGNOSIS — B9620 Unspecified Escherichia coli [E. coli] as the cause of diseases classified elsewhere: Secondary | ICD-10-CM

## 2020-12-19 DIAGNOSIS — R7881 Bacteremia: Secondary | ICD-10-CM

## 2020-12-19 LAB — GLUCOSE, CAPILLARY
Glucose-Capillary: 88 mg/dL (ref 70–99)
Glucose-Capillary: 122 mg/dL — ABNORMAL HIGH (ref 70–99)
Glucose-Capillary: 96 mg/dL (ref 70–99)
Glucose-Capillary: 100 mg/dL — ABNORMAL HIGH (ref 70–99)
Glucose-Capillary: 145 mg/dL — ABNORMAL HIGH (ref 70–99)

## 2020-12-19 LAB — CBC
HCT: 32 % — ABNORMAL LOW (ref 39.0–52.0)
Hemoglobin: 10 g/dL — ABNORMAL LOW (ref 13.0–17.0)
MCH: 33 pg (ref 26.0–34.0)
MCHC: 31.3 g/dL (ref 30.0–36.0)
MCV: 105.6 fL — ABNORMAL HIGH (ref 80.0–100.0)
Platelets: 235 10*3/uL (ref 150–400)
RBC: 3.03 MIL/uL — ABNORMAL LOW (ref 4.22–5.81)
RDW: 16.1 % — ABNORMAL HIGH (ref 11.5–15.5)
WBC: 9.3 10*3/uL (ref 4.0–10.5)
nRBC: 0 % (ref 0.0–0.2)

## 2020-12-19 LAB — BASIC METABOLIC PANEL
Anion gap: 8 (ref 5–15)
BUN: 102 mg/dL — ABNORMAL HIGH (ref 6–20)
CO2: 24 mmol/L (ref 22–32)
Calcium: 8.5 mg/dL — ABNORMAL LOW (ref 8.9–10.3)
Chloride: 122 mmol/L — ABNORMAL HIGH (ref 98–111)
Creatinine, Ser: 1.92 mg/dL — ABNORMAL HIGH (ref 0.61–1.24)
GFR, Estimated: 40 mL/min — ABNORMAL LOW (ref >60)
Glucose, Bld: 130 mg/dL — ABNORMAL HIGH (ref 70–99)
Potassium: 4.8 mmol/L (ref 3.5–5.1)
Sodium: 154 mmol/L — ABNORMAL HIGH (ref 135–145)

## 2020-12-19 MED ORDER — CLONAZEPAM 0.5 MG PO TBDP
0.5000 mg | ORAL_TABLET | Freq: Two times a day (BID) | ORAL | Status: DC
  Administered 2020-12-19 – 2020-12-28 (×17): 0.5 mg
  Filled 2020-12-19 (×18): qty 1

## 2020-12-19 MED ORDER — DEXTROSE 5 % IV SOLN: INTRAVENOUS | Status: DC

## 2020-12-19 MED ORDER — FREE WATER
300.0000 mL | Status: DC
  Administered 2020-12-19 – 2020-12-24 (×55): 300 mL

## 2020-12-19 MED ORDER — PROPOFOL 1000 MG/100ML IV EMUL
0.0000 ug/kg/min | INTRAVENOUS | Status: DC
  Administered 2020-12-19: 5 ug/kg/min via INTRAVENOUS
  Administered 2020-12-19 (×2): 28.716 ug/kg/min via INTRAVENOUS
  Administered 2020-12-19: 30 ug/kg/min via INTRAVENOUS
  Administered 2020-12-19: 28.716 ug/kg/min via INTRAVENOUS
  Administered 2020-12-20 (×3): 20 ug/kg/min via INTRAVENOUS
  Administered 2020-12-21: 15 ug/kg/min via INTRAVENOUS
  Administered 2020-12-22 – 2020-12-23 (×6): 10 ug/kg/min via INTRAVENOUS
  Administered 2020-12-24: 11 ug/kg/min via INTRAVENOUS
  Administered 2020-12-24 (×2): 10 ug/kg/min via INTRAVENOUS
  Administered 2020-12-25 (×2): 20 ug/kg/min via INTRAVENOUS
  Filled 2020-12-19 (×8): qty 100
  Filled 2020-12-19: qty 200
  Filled 2020-12-19 (×13): qty 100

## 2020-12-19 MED ORDER — FUROSEMIDE 10 MG/ML IJ SOLN
60.0000 mg | Freq: Once | INTRAMUSCULAR | Status: AC
  Administered 2020-12-19: 60 mg via INTRAVENOUS
  Filled 2020-12-19: qty 6

## 2020-12-19 NOTE — Progress Notes (Signed)
NAME:  [name], MRN:  [id number], DOB:  28-Oct-1960, LOS: 9 ADMISSION DATE:  12/10/2020,   History of Present Illness:  60 year old male who presented 3/31 to the ER from home with 4-5 day hx of diarrhea and vomiting.  He developed confusion and became belligerent at home. Initial ER evaluation found him to be hypotensive, combative, confused, and was intubated.  He was noted to have mottling over his trunk and extremities.  Admitted for hypoxemic respiratory failure and septic shock 2/2 E. Coli bacteremia and UTI, and ATN.   Pertinent  Medical History  Prior smoker quit 15 years ago 82-100-pack-year history Sinusitis  Chronic pain syndrome Hypertension Hyperlipidemia Chronic back pain  Significant Hospital Events: Including procedures, antibiotic start and stop dates in addition to other pertinent events   . 03/31 patient was intubated . 03/31 triple-lumen catheter inserted . 4/1 off pressors. E coli bacteremia, e coli uti. Some improvement in pulm opacities on CXR. On rocephin. TTE with LVEF 40-45%, LV global hypokinesis, G2DD, normal RV, dilated aortic root at 71m.   . 4/5 extubated, failed immediately due to mucous plugging identified on bronch after re-intubated. CT ABD with 480mleft ureteral stone with mild edema of left kideny, large areas of consolidation in bilateral lung bases.  . 4/6 broadened to cefepime in light of pneumonia on CT abdomen; urology consulted for distal left ureteral stone, to OR overnight for stent placement, UCx sent.  Versed added for agitation (on fent / precedex) . 4/7 Off vasopressors  Interim History / Subjective:  Afebrile overnight. Agitated overnight requiring increased sedation, needing propofol.  Objective   Blood pressure 121/62, pulse 82, temperature (!) 97.3 F (36.3 C), temperature source Axillary, resp. rate (!) 24, height _0  (1.88 m), weight (!) 145.9 kg, SpO2 100 %.    Vent Mode: PRVC FiO2 (%):  [40 %] 40 % Set Rate:  [20 bmp]  20 bmp Vt Set:  [660 mL] 660 mL PEEP:  [5 cmH20] 5 cmH20 Pressure Support:  [10 cmH20] 10 cmH20 Plateau Pressure:  [15 cmH20-25 cmH20] 15 cmH20   Intake/Output Summary (Last 24 hours) at 12/19/2020 1317 Last data filed at 12/19/2020 1100 Gross per 24 hour  Intake 4412.3 ml  Output 4125 ml  Net 287.3 ml   Filed Weights   12/16/20 0245 12/17/20 0319 12/19/20 0500  Weight: (!) 145.5 kg (!) 145.1 kg (!) 145.9 kg    Examination:  General: critically ill appearing man laying in bed in NAD  HEENT: [place]/AT, eyes anicteric Neuro: pinpoint pupils, RASS -5, breathing comfortably on PS 8 + CPAP 5 CV: S1S2, RRR PULM: thick secretions from ETT requiring repeat suctioning. Tachypnea, but no accessory muscle use.  GI: obese, soft, NT, ND Extremities: no c/c, mild peripheral edema Skin: no rashes or ecchymoses    Na+ 154 BUN 102 Cr 1.92 WBC 9   Resolved Hospital Problem list   Sepsis Associated DIC Traumatic Urrethral Bleeding  Assessment & Plan:   Septic Shock due to E coli UTI with Bacteremia, Pyelonephritis, Pneumonia S/p ureteral stent placement 4/6 -Continue ceftriaxone; may need to continue until stent is removed. Will disucss with urology.   -vasopressors as required to maintain MAP >65 -needs CVC due to difficult IV access  Acute hypoxic and hypercarbic respiratory failure In setting of sepsis/bacteremia with inability to compensate for increased metabolic demands, increased secretions with poor clearance.  Dense bilateral infiltrates noted on CT abdomen R>L.  Failed extubation 4/5, reintubated with increased secretions / inability to  clear. BAL negative from 4/5.  -LTVV, 4-8cc/kg IBW with goal Pplat <30 and DP<15 -wean FiO2 per ARDS protocol; wean to saturations >90% -pulmonary hygiene; still has thick secretions that would be difficult for him to manage on his own -VAP prevention procol -PAD protocol for sedation -daily SAT & SBT as appropriate- doing well on PS 8 + CPAP 5  today but failed SAT from agitation -assess ABG to ensure not over ventilating  -daily SBT / WUA, secretions remain barrier for extubation  -con't hypertonic saline neb + guaifenesin for secretions   Acute Renal Failure - slowly improving Hypernatremia - worsening despite increased FW In setting of sepsis, ATN, pyelonephritis.  L ureteral stone w/o hydronephrosis s/p ureteral stent placement by Urology 4/6.  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 -needs follow up with CT as outpatient   Chronic Pain -continue home oxycodone enterally to reduce IV pain med requirements  Best practice (right click and "Reselect all SmartList Selections" daily)  Diet:  Tube Feed  Pain/Anxiety/Delirium protocol (if indicated): Yes (RASS goal -1) VAP protocol (if indicated): Yes DVT prophylaxis: Subcutaneous Heparin and SCD GI prophylaxis: PPI Glucose control:  See above Central venous access:  Yes, and it is still needed Arterial line:  N/A Foley:  Yes, and it is still needed   Mobility:  bed rest  PT consulted: N/A Last date of multidisciplinary goals of care discussion:  Code Status:  full code Disposition: ICU    This patient is critically ill with multiple organ system failure which requires frequent high complexity  decision making, assessment, support, evaluation, and titration of therapies. This was completed through the application of advanced monitoring technologies and extensive interpretation of multiple databases. During this encounter critical care time was devoted to patient care services described in this note for 38 minutes.   Julian Hy, DO 12/19/20 5:32 PM [facility]  For contact information, see Amion. If no response to pager, please call PCCM consult pager. After hours, 7PM- 7AM, please call Elink.

## 2020-12-19 NOTE — Progress Notes (Signed)
eLink Physician-Brief Progress Note Patient Name: [name] DOB: [birth year] MRN: [id number]   Date of Service  12/19/2020  HPI/Events of Note  Ventilator dyssynchrony when he is more alert.  He is on precedex + fentanyl infusion + receiving PRN pushes of versed.   eICU Interventions  Discontinue versed pushes.  Start low dose propofol infusion to achieve more consistent level of sedation.     Intervention Category Major Interventions: Respiratory failure - evaluation and management  Marveen Reeks Emalynn Clewis 12/19/2020, 12:26 AM

## 2020-12-20 ENCOUNTER — Inpatient Hospital Stay (HOSPITAL_COMMUNITY): Payer: Medicaid Other

## 2020-12-20 ENCOUNTER — Encounter (HOSPITAL_COMMUNITY): Payer: Self-pay | Admitting: Pulmonary Disease

## 2020-12-20 DIAGNOSIS — R609 Edema, unspecified: Secondary | ICD-10-CM

## 2020-12-20 LAB — URINALYSIS, ROUTINE W REFLEX MICROSCOPIC
Bilirubin Urine: NEGATIVE
Glucose, UA: NEGATIVE mg/dL
Ketones, ur: NEGATIVE mg/dL
Nitrite: NEGATIVE
Protein, ur: 30 mg/dL — AB
Specific Gravity, Urine: 1.014 (ref 1.005–1.030)
pH: 5 (ref 5.0–8.0)

## 2020-12-20 LAB — BASIC METABOLIC PANEL
Anion gap: 9 (ref 5–15)
BUN: 127 mg/dL — ABNORMAL HIGH (ref 6–20)
CO2: 21 mmol/L — ABNORMAL LOW (ref 22–32)
Calcium: 7.9 mg/dL — ABNORMAL LOW (ref 8.9–10.3)
Chloride: 118 mmol/L — ABNORMAL HIGH (ref 98–111)
Creatinine, Ser: 3.15 mg/dL — ABNORMAL HIGH (ref 0.61–1.24)
GFR, Estimated: 22 mL/min — ABNORMAL LOW (ref >60)
Glucose, Bld: 162 mg/dL — ABNORMAL HIGH (ref 70–99)
Potassium: 4.8 mmol/L (ref 3.5–5.1)
Sodium: 148 mmol/L — ABNORMAL HIGH (ref 135–145)

## 2020-12-20 LAB — GLUCOSE, CAPILLARY
Glucose-Capillary: 126 mg/dL — ABNORMAL HIGH (ref 70–99)
Glucose-Capillary: 128 mg/dL — ABNORMAL HIGH (ref 70–99)
Glucose-Capillary: 129 mg/dL — ABNORMAL HIGH (ref 70–99)
Glucose-Capillary: 137 mg/dL — ABNORMAL HIGH (ref 70–99)
Glucose-Capillary: 157 mg/dL — ABNORMAL HIGH (ref 70–99)
Glucose-Capillary: 160 mg/dL — ABNORMAL HIGH (ref 70–99)
Glucose-Capillary: 183 mg/dL — ABNORMAL HIGH (ref 70–99)

## 2020-12-20 LAB — CBC
HCT: 27.2 % — ABNORMAL LOW (ref 39.0–52.0)
Hemoglobin: 8.1 g/dL — ABNORMAL LOW (ref 13.0–17.0)
MCH: 32.1 pg (ref 26.0–34.0)
MCHC: 29.8 g/dL — ABNORMAL LOW (ref 30.0–36.0)
MCV: 107.9 fL — ABNORMAL HIGH (ref 80.0–100.0)
Platelets: 200 10*3/uL (ref 150–400)
RBC: 2.52 MIL/uL — ABNORMAL LOW (ref 4.22–5.81)
RDW: 15.9 % — ABNORMAL HIGH (ref 11.5–15.5)
WBC: 8 10*3/uL (ref 4.0–10.5)
nRBC: 0 % (ref 0.0–0.2)

## 2020-12-20 LAB — SODIUM, URINE, RANDOM: Sodium, Ur: 35 mmol/L

## 2020-12-20 LAB — TRIGLYCERIDES: Triglycerides: 162 mg/dL — ABNORMAL HIGH (ref <150)

## 2020-12-20 LAB — CREATININE, URINE, RANDOM: Creatinine, Urine: 101.33 mg/dL

## 2020-12-20 MED ORDER — FUROSEMIDE 10 MG/ML IJ SOLN
80.0000 mg | Freq: Three times a day (TID) | INTRAMUSCULAR | Status: DC
  Administered 2020-12-20 – 2020-12-24 (×12): 80 mg via INTRAVENOUS
  Filled 2020-12-20 (×12): qty 8

## 2020-12-20 MED ORDER — INSULIN DETEMIR 100 UNIT/ML ~~LOC~~ SOLN
20.0000 [IU] | Freq: Two times a day (BID) | SUBCUTANEOUS | Status: DC
  Administered 2020-12-20 – 2020-12-24 (×9): 20 [IU] via SUBCUTANEOUS
  Filled 2020-12-20 (×11): qty 0.2

## 2020-12-20 MED ORDER — NOREPINEPHRINE 4 MG/250ML-% IV SOLN
0.0000 ug/min | INTRAVENOUS | Status: DC
  Administered 2020-12-22: 2 ug/min via INTRAVENOUS
  Administered 2020-12-23: 4 ug/min via INTRAVENOUS
  Administered 2020-12-24: 15 ug/min via INTRAVENOUS
  Administered 2020-12-24: 25 ug/min via INTRAVENOUS
  Administered 2020-12-24: 7.013 ug/min via INTRAVENOUS
  Administered 2020-12-25: 10 ug/min via INTRAVENOUS
  Administered 2020-12-25: 18 ug/min via INTRAVENOUS
  Administered 2020-12-25: 12 ug/min via INTRAVENOUS
  Administered 2020-12-25: 25 ug/min via INTRAVENOUS
  Administered 2020-12-25: 16 ug/min via INTRAVENOUS
  Administered 2020-12-25: 15 ug/min via INTRAVENOUS
  Administered 2020-12-26: 18 ug/min via INTRAVENOUS
  Filled 2020-12-20 (×12): qty 250

## 2020-12-20 NOTE — Consult Note (Addendum)
Renal Service Consult Note [facility]  [name] 12/20/2020 Stephen Krabbe, MD Requesting Physician: Dr. Chestine Fletcher, L  Reason for Consult: Renal failure HPI: The patient is a 60 y.o. year-old w/o primary medical history presented on 3/31 from home for AMS w/ HR 200 , hypotension 90/60, mottled and diaphoretic. Pt had 4d hx of diarrhea, gen weakness and malaise. Pt was cardioverted from Vtach to vent fibrillation then returned to NSR. In ED temp was 103deg.  Creat was 5, lactate 7 and UA showed UTI. Pt admitted and rx'd w IV abx for possible urosepsis. Blood and urine cx's grew EColi. Also had bilat infiltrates on CXR, suspected PNA and has required vent support.  Blood and urine cx's grew EColi and fevers improved over the next week or so.  Also complicated by vol overload up 10kg which has improved somewhat w/ diuresis. CT abd on 4/05 showed a distal L ureteral stone with mild edema of the L kidney.  Urology placed a L ureteral stent on 12/16/20. Creat gradually improved from 5.4 on admit down to 1.92 on 4/09. Creat today bumped up to 3.15. Asked to see for renal failure.    Pt seen in ICU.  Sedated on 3 meds including propofol, fentanyl and precedex. BP's 100's. No hx provided by pt, on vent.     ROS n/a  Past Medical History No past medical history on file. Past Surgical History none on file Family History No family history on file. Social History  has no history on file for tobacco use, alcohol use, and drug use. Allergies  Allergies  Allergen Reactions  . Ibuprofen Other (See Comments)    Blood in the stool.  . Metformin Other (See Comments)    Tremors, muscles  locking up, unable to control extremities   Home medications Prior to Admission medications   Medication Sig Start Date End Date Taking? Authorizing Provider  atorvastatin (LIPITOR) 40 MG tablet Take 40 mg by mouth daily.   Yes [provider]  glipiZIDE (GLUCOTROL) 10 MG tablet Take 10 mg by  mouth every morning. 06/18/20  Yes [provider]  oxyCODONE (OXYCONTIN) 10 mg 12 hr tablet Take 10 mg by mouth in the morning and at bedtime.   Yes [provider]  oxymetazoline (AFRIN) 0.05 % nasal spray Place 1 spray into both nostrils 2 (two) times daily as needed for congestion.   Yes [provider]  rOPINIRole (REQUIP) 1 MG tablet Take 1 mg by mouth in the morning and at bedtime.   Yes [provider]     Vitals:   12/20/20 0600 12/20/20 0700 12/20/20 0805 12/20/20 1121  BP: (!) 103/53   (!) 106/57  Pulse: 67   (!) 59  Resp: 20   20  Temp:  99.2 F (37.3 C)    TempSrc:  Esophageal    SpO2: 99%  98% 97%  Weight:      Height:       Exam Gen on vent, sedated No rash, cyanosis or gangrene Sclera anicteric, throat w/ ETT No jvd or bruits Chest clear anterior/ lateral, no rales or wheezing RRR no MRG Abd soft ntnd no mass or ascites +bs  Rectal tube draining liquid brown stool GU normal w/ foley draining yellow clear urine MS no joint effusions or deformity Ext diffuse 2+ pitting bialt UE and LE edema Neuro is on vent, sedated, not responsive   Home meds:  - none    UA 3/31 - cloudy  amber, large Hb, large LE/ neg nit, 100 prot, many bact, >50 wbc, 6-10rbc   UNa, UCr pending   Renal US 12/11/20 > 13-14 cm kidneys w/o hydro or ^echotexture    No old creatinine in system    CXR 4/09 - IMPRESSION: Cardiomegaly with pulmonary vascular congestion. There is a right pleural effusion. There is interstitial edema, less than on most recent study. The appearance is indicative of a degree of congestive heart failure. Patchy airspace opacity in the mid and lower lung regions may represent patchy alveolar edema or scattered areas of pneumonia     Serial CXR's > 4/05 showed worst pulm edema, improving edema by 4/09 CXR     ECHO 4/01 - LVEF 40-45%. Global hypoK. G2DD. RV looks normal.     Net I/O since admit = 45L in and 42 L out = + 3 L net    Admit wt  = 142kg, peak 154 kg on 4/03, yest 145kg, today 146kg     BP 90's- 100's/ 50-60's   HR 50- 70  RR 10  Vent 0.40      Lasix 60 mg IV yest x 1, 80 mg tid ordered for today    No contrast, acei/ aRB, nsaids    Assessment/ Plan: 1. Renal failure - presumably this is AKI.  Pt admitted w/ creat 5.5 which improved to 1.9 yesterday. Today is up to 3.1. Admitted for urosepsis w/ shock x 36 hrs. Grew ecoli in blood/urine. Found to have distal L ureteral stone by 4/06 CT and L ureteral stent placed on 4/06 by urology.  Creat steadily improved to 1.9 yest 4/09. Creat up today 3.1. BP's are soft but UOP continues to be good. He is on 3 sedating meds for his vent dependent resp failure. Serial CXR's have shown worsening CHF which was most severe on 4/5 but has improved per 4/09 CXR. On exam arms and legs are quite edematous still today.  Suspect bump in creat may be due to hypoperfusion related to hypotension from sedating infusions used for vent control. Also possible is recurrent sepsis [name] last 24 hrs). Have d/w primary MD that extubation is not likely to be successful, therefore plan is to add pressor support w/ hope of improving  renal perfusion, keep MAP > 75. Continue IV lasix at 80 tid. Repeat UA and urine lytes. Repeat renal US w/ creat bump. Will follow.  2. Vol overload - pulm edema improved by most recent cxr. 3. Urosepsis - on Rocephin per primary team. Blood / urine cx's were + for [name] from admission.  4. L ureteral stone - sp L ureteral stent per urology on 4/06 5. Vent dependent resp failure 6. Hypernatremia - hypervolemic. Cont lasix. Improving.  7. Anemia - Hb 8-10 range. Transfuse prn.       Stephen Rogina Schiano  MD 12/20/2020, 12:10 PM  Recent Labs  Lab 12/19/20 0046 12/20/20 0532  WBC 9.3 8.0  HGB 10.0* 8.1*   Recent Labs  Lab 12/16/20 0457 12/17/20 0221 12/18/20 1013 12/19/20 0046 12/20/20 0532  K 3.9 4.2   < > 4.8 4.8  BUN 122* 110*  --  102* 127*  CREATININE 2.94* 2.34*   --  1.92* 3.15*  CALCIUM 7.9* 8.0*  --  8.5* 7.9*  PHOS 5.7* 5.5*  --   --   --    < > = values in this interval not displayed.

## 2020-12-20 NOTE — Progress Notes (Signed)
NAME:  [name], MRN:  [id number], DOB:  1960/10/27, LOS: 74 ADMISSION DATE:  12/10/2020,   History of Present Illness:  60 year old male who presented 3/31 to the ER from home with 4-5 day hx of diarrhea and vomiting.  He developed confusion and became belligerent at home. Initial ER evaluation found him to be hypotensive, combative, confused, and was intubated.  He was noted to have mottling over his trunk and extremities.  Admitted for hypoxemic respiratory failure and septic shock 2/2 E. Coli bacteremia and UTI, and ATN.   Pertinent  Medical History  Prior smoker quit 15 years ago 82-100-pack-year history Sinusitis  Chronic pain syndrome Hypertension Hyperlipidemia Chronic back pain  Significant Hospital Events: Including procedures, antibiotic start and stop dates in addition to other pertinent events   . 03/31 patient was intubated . 03/31 triple-lumen catheter inserted . 4/1 off pressors. E coli bacteremia, e coli uti. Some improvement in pulm opacities on CXR. On rocephin. TTE with LVEF 40-45%, LV global hypokinesis, G2DD, normal RV, dilated aortic root at 7m.   . 4/5 extubated, failed immediately due to mucous plugging identified on bronch after re-intubated. CT ABD with 436mleft ureteral stone with mild edema of left kideny, large areas of consolidation in bilateral lung bases.  . 4/6 broadened to cefepime in light of pneumonia on CT abdomen; urology consulted for distal left ureteral stone, to OR overnight for stent placement, UCx sent.  Versed added for agitation (on fent / precedex) . 4/7 Off vasopressors  Interim History / Subjective:  Febrile overnight, Tmax 103. He remains on sedation and difficult to manage due to agitation when waking up.  Objective   Blood pressure (!) 103/53, pulse 67, temperature 99.2 F (37.3 C), temperature source Esophageal, resp. rate 20, height _0  (1.88 m), weight (!) 146.8 kg, SpO2 98 %.    Vent Mode: PRVC FiO2 (%):  [40 %] 40  % Set Rate:  [20 bmp] 20 bmp Vt Set:  [660 mL] 660 mL PEEP:  [5 cmH20] 5 cmH20 Pressure Support:  [8 cmH20] 8 cmH20 Plateau Pressure:  [17 cmH20-20 cmH20] 17 cmH20   Intake/Output Summary (Last 24 hours) at 12/20/2020 1109 Last data filed at 12/20/2020 0700 Gross per 24 hour  Intake 5393.07 ml  Output 2350 ml  Net 3043.07 ml  net +4L for admission  Filed Weights   12/17/20 0319 12/19/20 0500 12/20/20 0500  Weight: (!) 145.1 kg (!) 145.9 kg (!) 146.8 kg    Examination:  General: Critically ill-appearing man lying in bed intubated, sedated HEENT: [place]/AT, eyes anicteric, ETT in place Neuro: PERRL, RASS -5, apneic episodes on SBT. PULM: Still some thick secretions from endotracheal tube, no rhonchi or rales GI: Obese, soft, nontender, nondistended Extremities: No cyanosis, left lower extremity edema-wife reports this is chronic Skin: No rashes or ecchymoses    Na+ 148 BUN 127 Cr 3.15 WBC 8 Urine cx NG BAL & trach aspirate normal flora   Resolved Hospital Problem list   Sepsis Associated DIC Traumatic Urrethral Bleeding  Assessment & Plan:   Septic Shock due to E coli UTI with Bacteremia, Pyelonephritis, Pneumonia S/p ureteral stent placement 4/6 -Continue ceftriaxone; may need to continue until stent is removed. Will disucss with urology.   -vasopressors as required to maintain MAP >65 -needs CVC due to difficult IV access  Acute hypoxic and hypercarbic respiratory failure In setting of sepsis/bacteremia with inability to compensate for increased metabolic demands, increased secretions with poor clearance.  Dense bilateral infiltrates  noted on CT abdomen R>L.  Failed extubation 4/5, reintubated with increased secretions / inability to clear. BAL negative from 4/5.  -LTVV, 4-8cc/kg IBW with goal Pplat <30 and DP<15 -wean FiO2 per ARDS protocol; wean to saturations >90% -pulmonary hygiene; still has thick secretions that would be difficult for him to manage on his  own -VAP prevention procol -PAD protocol for sedation -daily SAT & SBT as appropriate- apneic today.  Needs to titrate down sedation. -assess ABG to ensure not over ventilating  -daily SBT / WUA, secretions remain barrier for extubation  -con't hypertonic saline neb + guaifenesin for secretions   Acute Renal Failure -had been slowly improving, worsened again today Hypernatremia - worsening despite increased FW In setting of sepsis, ATN, pyelonephritis.  L ureteral stone w/o hydronephrosis s/p ureteral stent placement by Urology 4/6.  -Consult to nephrology; they have concerned that hypotension is leading to renal hypoperfusion.  New MAP goal greater than 75 with norepinephrine as needed to maintain adequate renal perfusion. 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 -needs follow up with CT as outpatient   Chronic Pain -continue home oxycodone enterally to reduce IV pain med requirements  Recurrent fevers -Trach aspirate culture -CBC tomorrow -Continue to monitor -Acetaminophen urine  Wife is updated at bedside regarding plan   Best practice (right click and "Reselect all SmartList Selections" daily)  Diet:  Tube Feed  Pain/Anxiety/Delirium protocol (if indicated): Yes (RASS goal -1) VAP protocol (if  indicated): Yes DVT prophylaxis: Subcutaneous Heparin and SCD GI prophylaxis: PPI Glucose control:  See above Central venous access:  Yes, and it is still needed Arterial line:  N/A Foley:  Yes, and it is still needed   Mobility:  bed rest  PT consulted: N/A Last date of multidisciplinary goals of care discussion:  Code Status:  full code Disposition: ICU    This patient is critically ill with multiple organ system failure which requires frequent high complexity decision making, assessment, support, evaluation, and titration of therapies. This was completed through the application of advanced monitoring technologies and extensive interpretation of multiple databases. During this encounter critical care time was devoted to patient care services described in this note for 41 minutes.   Julian Hy, DO 12/20/20 1:54 PM [facility]  For contact information, see Amion. If no response to pager, please call PCCM consult pager. After hours, 7PM- 7AM, please call Elink.

## 2020-12-20 NOTE — Progress Notes (Signed)
VASCULAR LAB    Left lower extremity venous duplex has been performed.  See CV proc for preliminary results.   Taisa Deloria, RVT 12/20/2020, 10:12 AM

## 2020-12-21 ENCOUNTER — Inpatient Hospital Stay (HOSPITAL_COMMUNITY): Payer: Medicaid Other

## 2020-12-21 DIAGNOSIS — I6389 Other cerebral infarction: Secondary | ICD-10-CM

## 2020-12-21 DIAGNOSIS — I639 Cerebral infarction, unspecified: Secondary | ICD-10-CM

## 2020-12-21 DIAGNOSIS — N201 Calculus of ureter: Secondary | ICD-10-CM

## 2020-12-21 DIAGNOSIS — I6330 Cerebral infarction due to thrombosis of unspecified cerebral artery: Secondary | ICD-10-CM | POA: Insufficient documentation

## 2020-12-21 LAB — CBC
HCT: 27.2 % — ABNORMAL LOW (ref 39.0–52.0)
Hemoglobin: 8.3 g/dL — ABNORMAL LOW (ref 13.0–17.0)
MCH: 32.5 pg (ref 26.0–34.0)
MCHC: 30.5 g/dL (ref 30.0–36.0)
MCV: 106.7 fL — ABNORMAL HIGH (ref 80.0–100.0)
Platelets: 194 10*3/uL (ref 150–400)
RBC: 2.55 MIL/uL — ABNORMAL LOW (ref 4.22–5.81)
RDW: 15.1 % (ref 11.5–15.5)
WBC: 6.9 10*3/uL (ref 4.0–10.5)
nRBC: 0 % (ref 0.0–0.2)

## 2020-12-21 LAB — COMPREHENSIVE METABOLIC PANEL
ALT: 13 U/L (ref 0–44)
AST: 62 U/L — ABNORMAL HIGH (ref 15–41)
Albumin: 1.7 g/dL — ABNORMAL LOW (ref 3.5–5.0)
Alkaline Phosphatase: 47 U/L (ref 38–126)
Anion gap: 9 (ref 5–15)
BUN: 137 mg/dL — ABNORMAL HIGH (ref 6–20)
CO2: 21 mmol/L — ABNORMAL LOW (ref 22–32)
Calcium: 8.3 mg/dL — ABNORMAL LOW (ref 8.9–10.3)
Chloride: 118 mmol/L — ABNORMAL HIGH (ref 98–111)
Creatinine, Ser: 3.12 mg/dL — ABNORMAL HIGH (ref 0.61–1.24)
GFR, Estimated: 22 mL/min — ABNORMAL LOW (ref >60)
Glucose, Bld: 150 mg/dL — ABNORMAL HIGH (ref 70–99)
Potassium: 4.9 mmol/L (ref 3.5–5.1)
Sodium: 148 mmol/L — ABNORMAL HIGH (ref 135–145)
Total Bilirubin: 0.8 mg/dL (ref 0.3–1.2)
Total Protein: 5.9 g/dL — ABNORMAL LOW (ref 6.5–8.1)

## 2020-12-21 LAB — ECHOCARDIOGRAM LIMITED BUBBLE STUDY
Height: 74 in
Weight: 5178.16 oz

## 2020-12-21 LAB — PROCALCITONIN: Procalcitonin: 3.75 ng/mL

## 2020-12-21 LAB — GLUCOSE, CAPILLARY
Glucose-Capillary: 126 mg/dL — ABNORMAL HIGH (ref 70–99)
Glucose-Capillary: 152 mg/dL — ABNORMAL HIGH (ref 70–99)
Glucose-Capillary: 143 mg/dL — ABNORMAL HIGH (ref 70–99)
Glucose-Capillary: 140 mg/dL — ABNORMAL HIGH (ref 70–99)
Glucose-Capillary: 168 mg/dL — ABNORMAL HIGH (ref 70–99)
Glucose-Capillary: 141 mg/dL — ABNORMAL HIGH (ref 70–99)

## 2020-12-21 LAB — HEMOGLOBIN A1C
Hgb A1c MFr Bld: 6.8 % — ABNORMAL HIGH (ref 4.8–5.6)
Mean Plasma Glucose: 148.46 mg/dL

## 2020-12-21 LAB — AMMONIA: Ammonia: 34 umol/L (ref 9–35)

## 2020-12-21 MED ORDER — MIDAZOLAM HCL 2 MG/2ML IJ SOLN
INTRAMUSCULAR | Status: AC
  Administered 2020-12-21: 2 mg
  Filled 2020-12-21: qty 2

## 2020-12-21 MED ORDER — MIDAZOLAM HCL 2 MG/2ML IJ SOLN
2.0000 mg | Freq: Once | INTRAMUSCULAR | Status: AC
  Administered 2020-12-21: 2 mg via INTRAVENOUS
  Filled 2020-12-21: qty 2

## 2020-12-21 MED ORDER — ASPIRIN 81 MG PO CHEW
81.0000 mg | CHEWABLE_TABLET | Freq: Every day | ORAL | Status: DC
  Administered 2020-12-21 – 2021-01-13 (×23): 81 mg
  Filled 2020-12-21 (×23): qty 1

## 2020-12-21 MED ORDER — SODIUM CHLORIDE 0.9% FLUSH: 10.0000 mL | INTRAVENOUS | Status: DC | PRN

## 2020-12-21 MED ORDER — SODIUM CHLORIDE 3 % IN NEBU
4.0000 mL | INHALATION_SOLUTION | Freq: Two times a day (BID) | RESPIRATORY_TRACT | Status: AC
  Administered 2020-12-21 – 2020-12-23 (×6): 4 mL via RESPIRATORY_TRACT
  Filled 2020-12-21 (×6): qty 4

## 2020-12-21 MED ORDER — MIDAZOLAM HCL 2 MG/2ML IJ SOLN: 2.0000 mg | Freq: Once | INTRAMUSCULAR | Status: DC

## 2020-12-21 MED ORDER — ATORVASTATIN CALCIUM 40 MG PO TABS
40.0000 mg | ORAL_TABLET | Freq: Every day | ORAL | Status: DC
  Administered 2020-12-21 – 2021-01-13 (×23): 40 mg
  Filled 2020-12-21 (×23): qty 1

## 2020-12-21 NOTE — Progress Notes (Signed)
Reason for Consult:stroke Referring Physician: Dr Tillman Sers is an 60 y.o. male.  With past medical history of hypertension, hyperlipidemia and chronic pain.  He was admitted on 12/10/2020 from home with for 5-day history of diarrhea vomiting, confusion and was found to be in septic shock with hypotension and hypoxemic respiratory failure requiring intubation and ventilatory support.  He was found to have E. coli bacteremia, urinary tract infection and acute tubular necrosis.  He has been on sedation for respiratory failure and vasopressor support.  Today the RN noted that he was not moving his right upper extremity which was weak and flaccid hence a CT scan of the head was obtained which shows a low-density in the right corona radiata compatible with subacute infarct.:  Patient has no prior history of stroke as per review of his chart.  He is unable to give any history and there is no family available at the bedside.  Carotid ultrasound is limited and left side cannot be studied due to bandages in the right side shows no significant stenosis.  Echocardiogram done on 12/11/2020 shows diminished ejection fraction of 40 to 45% with mild global hypokinesis.  No definite clot.  No past medical history on file.  Past Surgical History:  Procedure Laterality Date  . CYSTOSCOPY W/ URETERAL STENT PLACEMENT Left 12/16/2020   Procedure: CYSTOSCOPY WITH RETROGRADE PYELOGRAM/URETERAL STENT PLACEMENT;  Surgeon: Alfredo Martinez, MD;  Location: [facility] OR;  Service: Urology;  Laterality: Left;    No family history on file.  Social History:  has no history on file for tobacco use, alcohol use, and drug use.  Allergies:  Allergies  Allergen Reactions  . Ibuprofen Other (See Comments)    Blood in the stool.  . Metformin Other (See Comments)    Tremors, muscles  locking up, unable to control extremities    Medications: I have reviewed the patient's current medications.  Results for orders placed or  performed during the hospital encounter of 12/10/20 (from the past 48 hour(s))  Glucose, capillary     Status: Abnormal   Collection Time: 12/19/20  7:35 PM  Result Value Ref Range   Glucose-Capillary 145 (H) 70 - 99 mg/dL    Comment: Glucose reference range applies only to samples taken after fasting for at least 8 hours.  Glucose, capillary     Status: Abnormal   Collection Time: 12/20/20  1:20 AM  Result Value Ref Range   Glucose-Capillary 126 (H) 70 - 99 mg/dL    Comment: Glucose reference range applies only to samples taken after fasting for at least 8 hours.  Glucose, capillary     Status: Abnormal   Collection Time: 12/20/20  4:28 AM  Result Value Ref Range   Glucose-Capillary 128 (H) 70 - 99 mg/dL    Comment: Glucose reference range applies only to samples taken after fasting for at least 8 hours.  Basic metabolic panel     Status: Abnormal   Collection Time: 12/20/20  5:32 AM  Result Value Ref Range   Sodium 148 (H) 135 - 145 mmol/L   Potassium 4.8 3.5 - 5.1 mmol/L   Chloride 118 (H) 98 - 111 mmol/L   CO2 21 (L) 22 - 32 mmol/L   Glucose, Bld 162 (H) 70 - 99 mg/dL    Comment: Glucose reference range applies only to samples taken after fasting for at least 8 hours.   BUN 127 (H) 6 - 20 mg/dL   Creatinine, Ser 7.25 (H) 0.61 - 1.24  mg/dL    Comment: DELTA CHECK NOTED   Calcium 7.9 (L) 8.9 - 10.3 mg/dL   GFR, Estimated 22 (L) >60 mL/min    Comment: (NOTE) Calculated using the CKD-EPI Creatinine Equation (2021)    Anion gap 9 5 - 15    Comment: Performed at [facility], [address] [place], [place] [address]  CBC     Status: Abnormal   Collection Time: 12/20/20  5:32 AM  Result Value Ref Range   WBC 8.0 4.0 - 10.5 K/uL   RBC 2.52 (L) 4.22 - 5.81 MIL/uL   Hemoglobin 8.1 (L) 13.0 - 17.0 g/dL   HCT 82.9 (L) 56.2 - 13.0 %   MCV 107.9 (H) 80.0 - 100.0 fL   MCH 32.1 26.0 - 34.0 pg   MCHC 29.8 (L) 30.0 - 36.0 g/dL   RDW 86.5 (H) 78.4 - 69.6 %   Platelets 200 150 -  400 K/uL   nRBC 0.0 0.0 - 0.2 %    Comment: Performed at [facility], [address] [place], [place] [address]  Triglycerides     Status: Abnormal   Collection Time: 12/20/20  5:32 AM  Result Value Ref Range   Triglycerides 162 (H) <150 mg/dL    Comment: Performed at [facility], [address] [place], [place] [address]  Glucose, capillary     Status: Abnormal   Collection Time: 12/20/20  7:42 AM  Result Value Ref Range   Glucose-Capillary 160 (H) 70 - 99 mg/dL    Comment: Glucose reference range applies only to samples taken after fasting for at least 8 hours.  Culture, Respiratory w Gram Stain     Status: None (Preliminary result)   Collection Time: 12/20/20 11:45 AM   Specimen: Tracheal Aspirate; Respiratory  Result Value Ref Range   Specimen Description TRACHEAL ASPIRATE    Special Requests NONE    Gram Stain      ABUNDANT WBC PRESENT, PREDOMINANTLY PMN FEW SQUAMOUS EPITHELIAL CELLS PRESENT MODERATE GRAM POSITIVE COCCI IN CLUSTERS RARE GRAM POSITIVE RODS RARE GRAM NEGATIVE RODS    Culture      CULTURE REINCUBATED FOR BETTER GROWTH Performed at [facility], [address], [place], [place] [address]    Report Status PENDING   Glucose, capillary     Status: Abnormal   Collection Time: 12/20/20 11:47 AM  Result Value Ref Range   Glucose-Capillary 183 (H) 70 - 99 mg/dL    Comment: Glucose reference range applies only to samples taken after fasting for at least 8 hours.  Creatinine, urine, random     Status: None   Collection Time: 12/20/20  2:59 PM  Result Value Ref Range   Creatinine, Urine 101.33 mg/dL    Comment: Performed at [facility], [address], [place], [place] [address]  Sodium, urine, random     Status: None   Collection Time: 12/20/20  2:59 PM  Result Value Ref Range   Sodium, Ur 35 mmol/L    Comment: Performed at [facility], [address], [place], [place] [address]  Urinalysis, Routine w reflex microscopic      Status: Abnormal   Collection Time: 12/20/20  2:59 PM  Result Value Ref Range   Color, Urine YELLOW YELLOW   APPearance CLOUDY (A) CLEAR   Specific Gravity, Urine 1.014 1.005 - 1.030   pH 5.0 5.0 - 8.0   Glucose, UA NEGATIVE NEGATIVE mg/dL   Hgb  urine dipstick LARGE (A) NEGATIVE   Bilirubin Urine NEGATIVE NEGATIVE   Ketones, ur NEGATIVE NEGATIVE mg/dL   Protein, ur 30 (A) NEGATIVE mg/dL   Nitrite NEGATIVE NEGATIVE   Leukocytes,Ua TRACE (A) NEGATIVE   RBC / HPF 21-50 0 - 5 RBC/hpf   WBC, UA 11-20 0 - 5 WBC/hpf   Bacteria, UA RARE (A) NONE SEEN   Squamous Epithelial / LPF 0-5 0 - 5   Mucus PRESENT     Comment: Performed at [facility], [address], [place], [place] [address]  Glucose, capillary     Status: Abnormal   Collection Time: 12/20/20  3:32 PM  Result Value Ref Range   Glucose-Capillary 157 (H) 70 - 99 mg/dL    Comment: Glucose reference range applies only to samples taken after fasting for at least 8 hours.  Glucose, capillary     Status: Abnormal   Collection Time: 12/20/20  7:37 PM  Result Value Ref Range   Glucose-Capillary 129 (H) 70 - 99 mg/dL    Comment: Glucose reference range applies only to samples taken after fasting for at least 8 hours.  Glucose, capillary     Status: Abnormal   Collection Time: 12/20/20 11:34 PM  Result Value Ref Range   Glucose-Capillary 137 (H) 70 - 99 mg/dL    Comment: Glucose reference range applies only to samples taken after fasting for at least 8 hours.  Glucose, capillary     Status: Abnormal   Collection Time: 12/21/20  3:25 AM  Result Value Ref Range   Glucose-Capillary 126 (H) 70 - 99 mg/dL    Comment: Glucose reference range applies only to samples taken after fasting for at least 8 hours.  Comprehensive metabolic panel     Status: Abnormal   Collection Time: 12/21/20  5:00 AM  Result Value Ref Range   Sodium 148 (H) 135 - 145 mmol/L   Potassium 4.9 3.5 - 5.1 mmol/L   Chloride 118 (H) 98 - 111 mmol/L   CO2 21 (L)  22 - 32 mmol/L   Glucose, Bld 150 (H) 70 - 99 mg/dL    Comment: Glucose reference range applies only to samples taken after fasting for at least 8 hours.   BUN 137 (H) 6 - 20 mg/dL   Creatinine, Ser 6.04 (H) 0.61 - 1.24 mg/dL   Calcium 8.3 (L) 8.9 - 10.3 mg/dL   Total Protein 5.9 (L) 6.5 - 8.1 g/dL   Albumin 1.7 (L) 3.5 - 5.0 g/dL   AST 62 (H) 15 - 41 U/L   ALT 13 0 - 44 U/L   Alkaline Phosphatase 47 38 - 126 U/L   Total Bilirubin 0.8 0.3 - 1.2 mg/dL   GFR, Estimated 22 (L) >60 mL/min    Comment: (NOTE) Calculated using the CKD-EPI Creatinine Equation (2021)    Anion gap 9 5 - 15    Comment: Performed at [facility], [address], [place], [place] [address]  CBC     Status: Abnormal   Collection Time: 12/21/20  5:00 AM  Result Value Ref Range   WBC 6.9 4.0 - 10.5 K/uL   RBC 2.55 (L) 4.22 - 5.81 MIL/uL   Hemoglobin 8.3 (L) 13.0 - 17.0 g/dL   HCT 11.9 (L) 14.7 - 82.9 %   MCV 106.7 (H) 80.0 - 100.0 fL   MCH 32.5 26.0 - 34.0 pg   MCHC 30.5 30.0 - 36.0 g/dL   RDW 56.2 13.0 - 86.5 %  Platelets 194 150 - 400 K/uL   nRBC 0.0 0.0 - 0.2 %    Comment: Performed at [facility], [address] [place], [place] [address]  Glucose, capillary     Status: Abnormal   Collection Time: 12/21/20  7:39 AM  Result Value Ref Range   Glucose-Capillary 152 (H) 70 - 99 mg/dL    Comment: Glucose reference range applies only to samples taken after fasting for at least 8 hours.  Glucose, capillary     Status: Abnormal   Collection Time: 12/21/20 11:21 AM  Result Value Ref Range   Glucose-Capillary 143 (H) 70 - 99 mg/dL    Comment: Glucose reference range applies only to samples taken after fasting for at least 8 hours.  Ammonia     Status: None   Collection Time: 12/21/20 11:35 AM  Result Value Ref Range   Ammonia 34 9 - 35 umol/L    Comment: Performed at [facility], [address], [place], [place] [address]  Glucose, capillary     Status: Abnormal   Collection Time:  12/21/20  3:11 PM  Result Value Ref Range   Glucose-Capillary 140 (H) 70 - 99 mg/dL    Comment: Glucose reference range applies only to samples taken after fasting for at least 8 hours.    CT HEAD WO CONTRAST  Result Date: 12/21/2020 CLINICAL DATA:  Altered mental status EXAM: CT HEAD WITHOUT CONTRAST TECHNIQUE: Contiguous axial images were obtained from the base of the skull through the vertex without intravenous contrast. COMPARISON:  None. FINDINGS: Brain: There is slight diffuse atrophy. There is no appreciable intracranial mass, hemorrhage, extra-axial fluid collection, or midline shift. There is a focal age uncertain infarct in the superior right centrum semiovale measuring 1.0 x 0.8 cm. Elsewhere there is slight periventricular small vessel disease in the centra semiovale bilaterally. No other findings suggesting potential recent/acute infarct. Vascular: No hypervascular lesion. There are foci of calcification in the carotid siphon regions. Skull: Bony calvarium appears intact. There is a 3 mm right frontal enostosis without surrounding edema or mass effect. Sinuses/Orbits: There is extensive opacification throughout the sphenoid sinus region. Patient has had antrostomies and ethmoid sinus surgery previously with areas of mucosal thickening in these areas, more severe on the right than on the left. Opacification throughout the left frontal sinus is noted. Orbits appear symmetric bilaterally. Other: Opacification of most mastoid air cells bilaterally noted. IMPRESSION: 1. Focal age uncertain but potentially recent infarct in the superior right centrum semiovale near the frontal-parietal junction. Elsewhere there is slight periventricular small vessel disease in the centra semiovale bilaterally. No mass or hemorrhage. 2.  Foci of arterial vascular calcification noted. 3. Extensive multifocal paranasal sinus disease as well as extensive mastoid air cell disease bilaterally. These results will be called  to the ordering clinician or representative by the Radiologist Assistant, and communication documented in the PACS or [employer]. Electronically Signed   By: Bretta Bang III M.D.   On: 12/21/2020 11:10   US RENAL  Result Date: 12/20/2020 CLINICAL DATA:  Acute kidney injury. EXAM: RENAL / URINARY TRACT ULTRASOUND COMPLETE COMPARISON:  12/11/2020 FINDINGS: Right Kidney: Renal measurements: 13.9 x 6.6 x 6.3 cm = volume: 304 mL. Echogenicity within normal limits. No mass or hydronephrosis visualized. Left Kidney: Renal measurements: 13.9 x 6.5 x 6.4 cm = volume: 303 mL. Echogenicity within normal limits. No mass. Interval minimal hydronephrosis. Bladder: Not visualized. Other: None. IMPRESSION: 1. Interval minimal left hydronephrosis. 2. Nonvisualized  urinary bladder. Electronically Signed   By: Beckie SaltsSteven  Reid M.D.   On: 12/20/2020 19:02   DG Chest Port 1 View  Result Date: 12/21/2020 CLINICAL DATA:  Pneumonia. EXAM: PORTABLE CHEST 1 VIEW COMPARISON:  December 19, 2020. FINDINGS: Stable cardiomegaly. Endotracheal and nasogastric tubes are in good position. No pneumothorax is noted. Bibasilar atelectasis, infiltrate or edema is noted. Small right pleural effusion may be present. Bony thorax is unremarkable. IMPRESSION: Stable support apparatus. Stable bibasilar opacities are noted concerning for atelectasis, infiltrate or edema. Electronically Signed   By: Lupita RaiderJames  Green Jr M.D.   On: 12/21/2020 14:50   ECHOCARDIOGRAM LIMITED BUBBLE STUDY  Result Date: 12/21/2020    ECHOCARDIOGRAM LIMITED REPORT   Patient Name:   Redgie GrayerLVIN Napolitano Date of Exam: 12/21/2020 Medical Rec #:  [id number]    Height:       74.0 in Accession #:    [id number]   Weight:       323.6 lb Date of Birth:  1961-01-01   BSA:          2.667 m Patient Age:    59 years     BP:           154/74 mmHg Patient Gender: M            HR:           90 bpm. Exam Location:  Inpatient Procedure: Saline Contrast Bubble Study, Limited Echo and Color Doppler  Indications:    Stroke  History:        Patient has prior history of Echocardiogram examinations, most                 recent 12/11/2020. Risk Factors:Hypertension and Dyslipidemia.  Sonographer:    Gertie FeyMichelle Simonetti MHA, RDMS, RVT, RDCS Sonographer#2:  Lavenia AtlasBrooke Strickland Referring Phys: [id number] BRADLEY L ICARD  Sonographer Comments: Echo performed with patient supine and on artificial respirator and patient is morbidly obese. Image acquisition challenging due to patient body habitus and Image acquisition challenging due to respiratory motion. IMPRESSIONS  1. Limited bubble study for PFO - unfortunately, images were non-diagonostic. The left ventricle was noted to have scatter artifact that was not distinguishable from microbubbles. Conclusion(s)/Recommendation(s): Consider TEE with bubble study at beside if strong clinical concern for PFO with right to left shunting. FINDINGS  IAS/Shunts: Agitated saline contrast was given intravenously to evaluate for intracardiac shunting. Zoila ShutterKenneth Hilty MD Electronically signed by Zoila ShutterKenneth Hilty MD Signature Date/Time: 12/21/2020/2:50:02 PM    Final    VAS US CAROTID  Result Date: 12/21/2020 Carotid Arterial Duplex Study Indications:       CVA. Limitations        Today's exam was limited due to Unable to assess LT carotid                    due to bandaging/central line. RT side limited due to patient                    position. Comparison Study:  No prior studies. Performing Technologist: Jean Rosenthalachel Hodge RDMS,RVT  Examination Guidelines: A complete evaluation includes B-mode imaging, spectral Doppler, color Doppler, and power Doppler as needed of all accessible portions of each vessel. Bilateral testing is considered an integral part of a complete examination. Limited examinations for reoccurring indications may be performed as noted.  Right Carotid Findings: +----------+--------+--------+--------+------------------+----------------+           PSV cm/sEDV cm/sStenosisPlaque  DescriptionComments         +----------+--------+--------+--------+------------------+----------------+  CCA Prox                                            Unable to assess +----------+--------+--------+--------+------------------+----------------+ CCA Distal143     40                                                 +----------+--------+--------+--------+------------------+----------------+ ICA Prox  90      25      1-39%   heterogenous                       +----------+--------+--------+--------+------------------+----------------+ ICA Distal159     48                                tortuous         +----------+--------+--------+--------+------------------+----------------+ ECA       224     29                                                 +----------+--------+--------+--------+------------------+----------------+ +----------+--------+-------+----------------+-------------------+           PSV cm/sEDV cmsDescribe        Arm Pressure (mmHG) +----------+--------+-------+----------------+-------------------+ [id number]            Multiphasic, WNL                    +----------+--------+-------+----------------+-------------------+ +---------+--------+--------+------------+ VertebralPSV cm/sEDV cm/sNot assessed +---------+--------+--------+------------+  Left Carotid Findings: Unable to assess due to bandaging/line.  Summary: Right Carotid: Velocities in the right ICA are consistent with a 1-39% stenosis. Subclavians: Normal flow hemodynamics were seen in the right subclavian artery. *See table(s) above for measurements and observations.  Electronically signed by Sherald Hess MD on 12/21/2020 at 4:29:39 PM.   Final    VAS [place] LOWER EXTREMITY VENOUS (DVT)  Result Date: 12/20/2020  Lower Venous DVT Study Indications: Edema.  Comparison Study: Prior negative Bilateral LEV done 12/15/20 Performing Technologist: Sherren Kerns RVS  Examination Guidelines: A  complete evaluation includes B-mode imaging, spectral Doppler, color Doppler, and power Doppler as needed of all accessible portions of each vessel. Bilateral testing is considered an integral part of a complete examination. Limited examinations for reoccurring indications may be performed as noted. The reflux portion of the exam is performed with the patient in reverse Trendelenburg.  +-----+---------------+---------+-----------+----------+--------------+ RIGHTCompressibilityPhasicitySpontaneityPropertiesThrombus Aging +-----+---------------+---------+-----------+----------+--------------+ CFV  Full           Yes      Yes                                 +-----+---------------+---------+-----------+----------+--------------+ SFJ  Full                                                        +-----+---------------+---------+-----------+----------+--------------+   +---------+---------------+---------+-----------+----------+--------------+ LEFT  CompressibilityPhasicitySpontaneityPropertiesThrombus Aging +---------+---------------+---------+-----------+----------+--------------+ CFV      Full           Yes      Yes                                 +---------+---------------+---------+-----------+----------+--------------+ SFJ      Full                                                        +---------+---------------+---------+-----------+----------+--------------+ FV Prox  Full                                                        +---------+---------------+---------+-----------+----------+--------------+ FV Mid   Full                                                        +---------+---------------+---------+-----------+----------+--------------+ FV DistalFull                                                        +---------+---------------+---------+-----------+----------+--------------+ PFV      Full                                                         +---------+---------------+---------+-----------+----------+--------------+ POP      Full           Yes      Yes                                 +---------+---------------+---------+-----------+----------+--------------+ PTV      Full                                                        +---------+---------------+---------+-----------+----------+--------------+ PERO     Full                                                        +---------+---------------+---------+-----------+----------+--------------+     Summary: RIGHT: - No evidence of common femoral vein obstruction.  LEFT: - Findings appear essentially unchanged compared to previous examination. - There is no evidence of deep vein thrombosis in the lower extremity.  *See table(s) above for measurements and observations. Electronically signed by Waverly Ferrari MD  on 12/20/2020 at 4:06:50 PM.    Final       ROS   Blood pressure 123/70, pulse 79, temperature 100 F (37.8 C), temperature source Esophageal, resp. rate 20, height 6\' 2"  (1.88 m), weight (!) 146.8 kg, SpO2 100 %. Physical Exam Middle-age Caucasian male is intubated mildly sedated.  Not in distress.  On ventilatory support for respiratory failure. . Afebrile. Head is nontraumatic. Neck is supple without bruit.    Cardiac exam no murmur or gallop. Lungs are clear to auscultation. Distal pulses are well felt. Neurological Exam: Patient is intubated and on ventilatory support.  He is stuporous.  He follows only occasional commands like sticking out his tongue.  Eyes are closed.  Opens eyes to sternal rub.  Eyes are in primary position.  He attempts to follow gaze but is not able to do so.  Blinks to threat on both sides.  Pupils 4 mm equal reactive.  Fundi not visualized.  Face is symmetric without weakness.  Motor system exam shows brisk withdrawal in the right upper and lower extremity and slight withdrawal in left lower extremity.  Left upper extremity is  flaccid and weak and does not respond to painful stimuli.  Tone is diminished in left upper extremity.  Reflexes are depressed.  Plantars both not elicitable.  Gait not tested.  Assessment/Plan: 60 year old Caucasian male with subacute left upper extremity weakness due to right frontoparietal subcortical infarct etiology indeterminate at the present time but possibility of cardiogenic embolism from endocarditis given present sepsis or from hypotension and intra or extracranial stenosis. Recommend check MRI scan of the brain and MRA of the brain and neck if patient is hemodynamically stable enough to be transported prior to extubation.  Check lipid profile hemoglobin A1c.  He will likely need a TEE to look for endocarditis particularly MRI shows multiple embolic strokes.  Aspirin for stroke prevention for now.  Continue antibiotics as per critical care team.  No family available at the bedside for discussion.  Discussed with Dr. 41 Icard critical care medicine. This patient is critically ill and at significant risk of neurological worsening, death and care requires constant monitoring of vital signs, hemodynamics,respiratory and cardiac monitoring, extensive review of multiple databases, frequent neurological assessment, discussion with family, other specialists and medical decision making of high complexity.I have made any additions or clarifications directly to the above note.This critical care time does not reflect procedure time, or teaching time or supervisory time of PA/NP/Med Resident etc but could involve care discussion time.  I spent 30 minutes of neurocritical care time  in the care of  this patient.      Elige Radon 12/21/2020, 5:21 PM    Note: This document was prepared with digital dictation and possible smart phrase technology. Any transcriptional errors that result from this process are unintentional.

## 2020-12-21 NOTE — Progress Notes (Addendum)
VASCULAR LAB    Carotid duplex has been performed.  See CV proc for preliminary results.   Yenni Carra, RVT 12/21/2020, 2:17 PM

## 2020-12-21 NOTE — Consult Note (Signed)
WOC Nurse Consult Note: Patient receiving care in [facility] 2M10. Assisted with turning by primary RN, Jamesetta So Reason for Consult: sacral wound Wound type: evolving DTPI to right upper buttock and gluteal cleft base Pressure Injury POA: No Measurement: right buttock wound measures 11 cm x 5 cm  Wound bed: maroon/purple with peeling overlying tissue Drainage (amount, consistency, odor) none Periwound: intact Dressing procedure/placement/frequency: continue use of foam dressings.  If right buttock wound or gluteal cleft becomes any color other than pink (yellow, brown, tan, grey, black) notify the MD and the WOC nurses. Helmut Muster, RN, MSN, CWOCN, CNS-BC, pager [phone]

## 2020-12-21 NOTE — Progress Notes (Signed)
Limited echocardiogram bubble study completed.  12/21/2020 2:33 PM Eula Fried., MHA, RVT, RDCS, RDMS

## 2020-12-21 NOTE — Progress Notes (Signed)
Pt transported to CT via ventilator. Pt remained stable throughout transport, with no complications noted. Pt sx prior to transport and after.

## 2020-12-21 NOTE — Progress Notes (Addendum)
NAME:  [name], MRN:  [id number], DOB:  01-24-1961, LOS: 20 ADMISSION DATE:  12/10/2020,   History of Present Illness:  60 year old male who presented 3/31 to the ER from home with 4-5 day hx of diarrhea and vomiting.  He developed confusion and became belligerent at home. Initial ER evaluation found him to be hypotensive, combative, confused, and was intubated.  He was noted to have mottling over his trunk and extremities.  Admitted for hypoxemic respiratory failure and septic shock 2/2 E. Coli bacteremia and UTI, and ATN.   Pertinent  Medical History  Prior smoker quit 15 years ago 82-100-pack-year history Sinusitis  Chronic pain syndrome Hypertension Hyperlipidemia Chronic back pain  Significant Hospital Events: Including procedures, antibiotic start and stop dates in addition to other pertinent events   . 03/31 patient was intubated . 03/31 triple-lumen catheter inserted . 4/1 off pressors. E coli bacteremia, e coli uti. Some improvement in pulm opacities on CXR. On rocephin. TTE with LVEF 40-45%, LV global hypokinesis, G2DD, normal RV, dilated aortic root at 30m.   . 4/5 extubated, failed immediately due to mucous plugging identified on bronch after re-intubated. CT ABD with 432mleft ureteral stone with mild edema of left kideny, large areas of consolidation in bilateral lung bases.  . 4/6 broadened to cefepime in light of pneumonia on CT abdomen; urology consulted for distal left ureteral stone, to OR overnight for stent placement, UCx sent.  Versed added for agitation (on fent / precedex) . 4/7 Off vasopressors . 4/11 Vasopressors restarted   Interim History / Subjective:  Awake, not following commands, pulled esophageal probe. Not moving right arm   Objective   Blood pressure (!) 128/54, pulse 76, temperature 98.8 F (37.1 C), temperature source Esophageal, resp. rate 20, height _0  (1.88 m), weight (!) 146.8 kg, SpO2 100 %.    Vent Mode: PRVC FiO2 (%):  [40 %] 40  % Set Rate:  [20 bmp] 20 bmp Vt Set:  [6[245L] 660 mL PEEP:  [5 cmH20] 5 cmH20 Plateau Pressure:  [18 cmH20-22 cmH20] 20 cmH20   Intake/Output Summary (Last 24 hours) at 12/21/2020 [id number] data filed at 12/21/2020 0749 Gross per 24 hour  Intake 2670.6 ml  Output 5900 ml  Net -3229.4 ml  net +4L for admission  Filed Weights   12/17/20 0319 12/19/20 0500 12/20/20 0500  Weight: (!) 145.1 kg (!) 145.9 kg (!) 146.8 kg    Examination:  General: Critically ill-appearing man lying in bed intubated, awake HEENT: [place]/AT, eyes anicteric, ETT in place Neuro: PERRL, RASS 0, not following commands  PULM: CTA bilaterally, minimal secretions GI: Obese, soft, nontender, nondistended Extremities: No cyanosis, left lower extremity edema-wife reports this is chronic, not moving left extremity, no reaction to pain hand, grimaces with pain arm, moving all other extremities  Skin: No rashes or ecchymoses  Imaging/Labs  4/11 renal USKoreaminimal left hydronephrosis  4/05 Ct abdomen: 33m12mistal ureteral stone with mild edema left kidney  4/01 renal US:[place]  Na+ 148 BUN 137 Cr 3.12 WBC 6.9 Urine cx NG BAL 4/10 with G+ rods and G- rods, G+ cocci clusters   Resolved Hospital Problem list   Sepsis Associated DIC Traumatic Urrethral Bleeding  Assessment & Plan:   Septic Shock due to E coli UTI with Bacteremia, Pyelonephritis, Pneumonia Recurrent Fevers.  S/p ureteral stent placement 4/6. 100.3 overnight. BAL 4/11 w/G+ cocci clusters, G+rods and G- rods. MRSA swab 3/31 negative.  -Continue ceftriaxone; may need to continue  until stent is removed. Will disucss with urology.   - f/u BAL cultures  -vasopressors as required to maintain MAP >65 -needs CVC due to difficult IV access  Acute hypoxic and hypercarbic respiratory failure In setting of sepsis/bacteremia with inability to compensate for increased metabolic demands, increased secretions with poor clearance.  Dense bilateral infiltrates  noted on CT abdomen R>L.  Failed extubation 4/5, reintubated with increased secretions / inability to clear. BAL negative from 4/5. Repeat BAL showing G+ cocci, rods, and G- rods.  -LTVV, 4-8cc/kg IBW with goal Pplat <30 and DP<15 -wean FiO2 per ARDS protocol; wean to saturations >90% -cxr today  -cont. pulmonary hygiene -VAP prevention procol -PAD protocol for sedation -daily SAT & SBT as appropriate, on precedex, propofol and fentanyl  -daily SBT / WUA, secretions remain barrier for extubation  -con't hypertonic saline neb + guaifenesin for secretions   Acute Renal Failure -stable, lasix started yesterday per nephro  Hypernatremia - improving slowly  In setting of sepsis, ATN, pyelonephritis.  L ureteral stone w/o hydronephrosis s/p ureteral stent placement by Urology 4/6. Repeat US yest showing mild L hydronephrosis.  -Consult to nephrology; they have concerned that hypotension is leading to renal hypoperfusion.  New MAP goal greater than 75 with norepinephrine as needed to maintain adequate renal perfusion. -Strict I/O - cont. 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 -needs follow up with CT as outpatient   Chronic Pain -continue home oxycodone enterally to reduce IV pain med requirements   Best practice (right click  and "Reselect all SmartList Selections" daily)  Diet:  Tube Feed  Pain/Anxiety/Delirium protocol (if indicated): Yes (RASS goal -1) VAP protocol (if indicated): Yes DVT prophylaxis: Subcutaneous Heparin and SCD GI prophylaxis: PPI Glucose control:  See above Central venous access:  Yes, and it is still needed Arterial line:  N/A Foley:  Yes, and it is still needed   Mobility:  bed rest  PT consulted: N/A Last date of multidisciplinary goals of care discussion: wife updated 4/10, will update today, 4/12 Code Status:  full code Disposition: ICU   Seawell, Jaimie A, DO 12/21/2020, 8:34 AM Pager: [phone]   PCCM:  60 yo M, E. Coli bactermia, septic shock, renal stone, obstructed s/p stent placement, pyelonephritis, failed extubation, ARF  BP 124/61 (BP Location: Left Leg)   Pulse 75   Temp 100 F (37.8 C) (Esophageal)   Resp 20   Ht _0  (1.88 m)   Wt (!) 146.8 kg   SpO2 100%   BMI 41.55 kg/m   Gen: obese, male, critically ill HENT: ETT  Lungs: BL vent breaths  Heart: S1 s2 RRR  Abd: obese soft NT   Labs reviewed   A:  Septic shock  E coli bactermia  Pyelonephritis Obstructed renal stone s/p stent  AHRF on MV  Positive CFB  ARF  P: Wean sedation as tolerated  Wean from vent as tolerated  Following UOP  Appreciate nephro recs  We need to see what his kidney function does before we consider extubation  Continue abx with ceftriaxone   This patient is critically ill with multiple organ system failure; which, requires frequent high complexity decision making, assessment, support, evaluation, and titration of therapies. This was completed through the application of advanced monitoring technologies and extensive interpretation of multiple databases. During this encounter critical care time was devoted to patient care services described in this note for 32 minutes.  Garner Nash, DO [facility] 12/21/2020 5:20 PM

## 2020-12-21 NOTE — Progress Notes (Signed)
eLink Physician-Brief Progress Note Patient Name: Kao Conry DOB: 1960/10/13 MRN: [id number]   Date of Service  12/21/2020  HPI/Events of Note  Patient is on the ventilator and needs a one time PRN order for Versed for a trip to MRI. Versed 2 mg iv x 1 worked very well for an earlier trip to CT.  eICU Interventions  Versed 2 mg iv x 1 on call to MRI ordered.        Thomasene Lot Britlyn Martine 12/21/2020, 11:24 PM

## 2020-12-21 NOTE — Progress Notes (Signed)
[facility] Progress Note   60 y.o. year-old p/w AMS  HR 200 , hypotension 90/60 + 4d hx of diarrhea, gen weakness and malaise. CV from Vtach to vent fibrillation then returned to NSR w/ temp of 103deg noted to have a cr of 5, lactate 7 and UA showed UTI. Blood and urine cx's grew EColi. Also had bilat infiltrates on CXR, suspected PNA and has required vent support.  Also complicated by vol overload up 10kg which has improved somewhat w/ diuresis. CT abd on 4/05 showed a distal L ureteral stone with mild edema of the L kidney.  Urology placed a L ureteral stent on 12/16/20. Creat gradually improved from 5.4 on admit down to 1.92 on 4/09. Creat today bumped up to 3.15. Asked to see for renal failure.    Assessment/ Plan:   1. Renal failure - presumably this is AKI.  Pt admitted w/ creat 5.5 which improved to 1.9  Before increasing again  to 3.1. Admitted for urosepsis w/ shock x 36 hrs. Grew ecoli in blood/urine. Found to have distal L ureteral stone by 4/06 CT and L ureteral stent placed on 4/06 by urology. Serial CXR's have shown worsening CHF which was most severe on 4/5 but  improved per 4/09 CXR.  Suspect bump in creat may be due to hypoperfusion related to hypotension from sedating infusions used for vent control +  sepsis Vida Roller last 24 hrs).  -  keep MAP > 75 -> BP is actually on higher side and renal function fairly stable - repeat u/s showed very minimal left hydro. - Continue IV lasix at 80 tid. Repeat UA showed WBC but decr c/w prior u/a. - Continue free water 333m q2hr NG for hypernatremia (fairly stable)  2. Vol overload - pulm edema improved by most recent cxr and on PE -> cont lasix. 3. Urosepsis - on Rocephin per primary team. [facility]  4. L ureteral stone - sp L ureteral stent per urology on 4/06 5. Vent dependent resp failure 6. Hypernatremia - hypervolemic. Cont lasix. Improving.  7. Anemia - Hb 8-10 range. Transfuse prn.     Subjective:   Weaned down to 2 sedatives but pulled on esophageal monitor   Objective:   BP (!) 160/68 (BP Location: Left Leg)   Pulse 93   Temp 98.8 F (37.1 C) (Esophageal)   Resp 19   Ht '6\' 2"'  (1.88 m)   Wt (!) 146.8 kg   SpO2 99%   BMI 41.55 kg/m   Intake/Output Summary (Last 24 hours) at 12/21/2020 [id number] data filed at 12/21/2020 0800 Gross per 24 hour  Intake 3799.66 ml  Output 5900 ml  Net -2100.34 ml   Weight change:   Physical Exam: Gen on vent, sedated but eyes open, not following commands No rash, cyanosis or gangrene Chest clear anterior/ lateral, no rales or wheezing RRR no MRG Abd soft ntnd no mass or ascites +bs  Rectal tube draining liquid brown stool GU normal w/ foley draining yellow clear urine Ext diffuse 1+ pitting bilat UE and LE edema  Imaging: [place]  Result Date: 12/20/2020 CLINICAL DATA:  Acute kidney injury. EXAM: RENAL / URINARY TRACT ULTRASOUND COMPLETE COMPARISON:  12/11/2020 FINDINGS: Right Kidney: Renal measurements: 13.9 x 6.6 x 6.3 cm = volume: 304 mL. Echogenicity within normal limits. No mass or hydronephrosis visualized. Left Kidney: Renal measurements: 13.9 x 6.5 x 6.4 cm = volume: 303 mL. Echogenicity within normal  limits. No mass. Interval minimal hydronephrosis. Bladder: Not visualized. Other: None. IMPRESSION: 1. Interval minimal left hydronephrosis. 2. Nonvisualized urinary bladder. Electronically Signed   By: Claudie Revering M.D.   On: 12/20/2020 19:02   VAS [place] LOWER EXTREMITY VENOUS (DVT)  Result Date: 12/20/2020  Lower Venous DVT Study Indications: Edema.  Comparison Study: Prior negative Bilateral LEV done 12/15/20 Performing Technologist: Sharion Dove RVS  Examination Guidelines: A complete evaluation includes B-mode imaging, spectral Doppler, color Doppler, and power Doppler as needed of all accessible portions of each vessel. Bilateral testing is considered an integral part of a complete examination. Limited examinations  for reoccurring indications may be performed as noted. The reflux portion of the exam is performed with the patient in reverse Trendelenburg.  +-----+---------------+---------+-----------+----------+--------------+ RIGHTCompressibilityPhasicitySpontaneityPropertiesThrombus Aging +-----+---------------+---------+-----------+----------+--------------+ CFV  Full           Yes      Yes                                 +-----+---------------+---------+-----------+----------+--------------+ SFJ  Full                                                        +-----+---------------+---------+-----------+----------+--------------+   +---------+---------------+---------+-----------+----------+--------------+ LEFT     CompressibilityPhasicitySpontaneityPropertiesThrombus Aging +---------+---------------+---------+-----------+----------+--------------+ CFV      Full           Yes      Yes                                 +---------+---------------+---------+-----------+----------+--------------+ SFJ      Full                                                        +---------+---------------+---------+-----------+----------+--------------+ FV Prox  Full                                                        +---------+---------------+---------+-----------+----------+--------------+ FV Mid   Full                                                        +---------+---------------+---------+-----------+----------+--------------+ FV DistalFull                                                        +---------+---------------+---------+-----------+----------+--------------+ PFV      Full                                                        +---------+---------------+---------+-----------+----------+--------------+  POP      Full           Yes      Yes                                 +---------+---------------+---------+-----------+----------+--------------+ PTV       Full                                                        +---------+---------------+---------+-----------+----------+--------------+ PERO     Full                                                        +---------+---------------+---------+-----------+----------+--------------+     Summary: RIGHT: - No evidence of common femoral vein obstruction.  LEFT: - Findings appear essentially unchanged compared to previous examination. - There is no evidence of deep vein thrombosis in the lower extremity.  *See table(s) above for measurements and observations. Electronically signed by Deitra Mayo MD on 12/20/2020 at 4:06:50 PM.    Final     Labs: BMET Recent Labs  Lab 12/15/20 0115 12/15/20 1022 12/16/20 0457 12/17/20 0221 12/18/20 1013 12/19/20 0046 12/20/20 0532 12/21/20 0500  NA 148* 151* 147* 150* 156* 154* 148* 148*  K 4.3 4.1 3.9 4.2 4.6 4.8 4.8 4.9  CL 116*  --  116* 119*  --  122* 118* 118*  CO2 22  --  22 24  --  24 21* 21*  GLUCOSE 113*  --  167* 154*  --  130* 162* 150*  BUN 100*  --  122* 110*  --  102* 127* 137*  CREATININE 2.67*  --  2.94* 2.34*  --  1.92* 3.15* 3.12*  CALCIUM 8.4*  --  7.9* 8.0*  --  8.5* 7.9* 8.3*  PHOS 4.3  --  5.7* 5.5*  --   --   --   --    CBC Recent Labs  Lab 12/17/20 0221 12/18/20 1013 12/19/20 0046 12/20/20 0532 12/21/20 0500  WBC 11.5*  --  9.3 8.0 6.9  HGB 9.4* 9.5* 10.0* 8.1* 8.3*  HCT 29.5* 28.0* 32.0* 27.2* 27.2*  MCV 100.3*  --  105.6* 107.9* 106.7*  PLT 197  --  235 200 194    Medications:    . chlorhexidine gluconate (MEDLINE KIT)  15 mL Mouth Rinse BID  . Chlorhexidine Gluconate Cloth  6 each Topical Daily  . clonazepam  0.5 mg Per Tube BID  . docusate  100 mg Per Tube BID  . feeding supplement (PROSource TF)  90 mL Per Tube TID  . folic acid  1 mg Per Tube Daily  . free water  300 mL Per Tube Q2H  . furosemide  80 mg Intravenous Q8H  . heparin injection (subcutaneous)  5,000 Units Subcutaneous Q8H  .  insulin aspart  0-15 Units Subcutaneous Q4H  . insulin detemir  20 Units Subcutaneous BID  . mouth rinse  15 mL Mouth Rinse 10 times per day  . multivitamin  15 mL Per Tube Daily  . oxyCODONE  7.5 mg Per Tube Q6H  .  pantoprazole sodium  40 mg Per Tube QHS  . polyethylene glycol  17 g Per Tube BID  . QUEtiapine  25 mg Per Tube BID  . sodium chloride flush  10-40 mL Intracatheter Q12H  . thiamine  100 mg Per Tube Daily  . vitamin B-12  100 mcg Per Tube Daily      Otelia Santee, MD 12/21/2020, 9:03 AM

## 2020-12-21 NOTE — Progress Notes (Signed)
Patient wakeup assessment with Propofol off patient has pulled out esophageal probe with his right hand but is unable to move left hand or arm and it is flaccid to painful stimuli. He does move both legs up and down in the bed . He makes eye contact but does not follow any simple commands. Jaimie Seawell in room to assess.

## 2020-12-22 ENCOUNTER — Inpatient Hospital Stay (HOSPITAL_COMMUNITY): Payer: Medicaid Other

## 2020-12-22 LAB — LIPID PANEL
Cholesterol: 133 mg/dL (ref 0–200)
HDL: 13 mg/dL — ABNORMAL LOW (ref >40)
LDL Cholesterol: 83 mg/dL (ref 0–99)
Total CHOL/HDL Ratio: 10.2 RATIO
Triglycerides: 183 mg/dL — ABNORMAL HIGH (ref <150)
VLDL: 37 mg/dL (ref 0–40)

## 2020-12-22 LAB — COMPREHENSIVE METABOLIC PANEL
ALT: 17 U/L (ref 0–44)
AST: 53 U/L — ABNORMAL HIGH (ref 15–41)
Albumin: 2 g/dL — ABNORMAL LOW (ref 3.5–5.0)
Alkaline Phosphatase: 55 U/L (ref 38–126)
Anion gap: 13 (ref 5–15)
BUN: 132 mg/dL — ABNORMAL HIGH (ref 6–20)
CO2: 22 mmol/L (ref 22–32)
Calcium: 8.6 mg/dL — ABNORMAL LOW (ref 8.9–10.3)
Chloride: 113 mmol/L — ABNORMAL HIGH (ref 98–111)
Creatinine, Ser: 2.62 mg/dL — ABNORMAL HIGH (ref 0.61–1.24)
GFR, Estimated: 27 mL/min — ABNORMAL LOW (ref >60)
Glucose, Bld: 153 mg/dL — ABNORMAL HIGH (ref 70–99)
Potassium: 4 mmol/L (ref 3.5–5.1)
Sodium: 148 mmol/L — ABNORMAL HIGH (ref 135–145)
Total Bilirubin: 0.7 mg/dL (ref 0.3–1.2)
Total Protein: 6.7 g/dL (ref 6.5–8.1)

## 2020-12-22 LAB — CBC WITH DIFFERENTIAL/PLATELET
Abs Immature Granulocytes: 0.04 10*3/uL (ref 0.00–0.07)
Basophils Absolute: 0 10*3/uL (ref 0.0–0.1)
Basophils Relative: 0 %
Eosinophils Absolute: 0.1 10*3/uL (ref 0.0–0.5)
Eosinophils Relative: 1 %
HCT: 28.5 % — ABNORMAL LOW (ref 39.0–52.0)
Hemoglobin: 8.9 g/dL — ABNORMAL LOW (ref 13.0–17.0)
Immature Granulocytes: 1 %
Lymphocytes Relative: 16 %
Lymphs Abs: 1.1 10*3/uL (ref 0.7–4.0)
MCH: 32 pg (ref 26.0–34.0)
MCHC: 31.2 g/dL (ref 30.0–36.0)
MCV: 102.5 fL — ABNORMAL HIGH (ref 80.0–100.0)
Monocytes Absolute: 0.3 10*3/uL (ref 0.1–1.0)
Monocytes Relative: 4 %
Neutro Abs: 5.4 10*3/uL (ref 1.7–7.7)
Neutrophils Relative %: 78 %
Platelets: 257 10*3/uL (ref 150–400)
RBC: 2.78 MIL/uL — ABNORMAL LOW (ref 4.22–5.81)
RDW: 14.6 % (ref 11.5–15.5)
WBC: 7 10*3/uL (ref 4.0–10.5)
nRBC: 0 % (ref 0.0–0.2)

## 2020-12-22 LAB — MAGNESIUM: Magnesium: 2.6 mg/dL — ABNORMAL HIGH (ref 1.7–2.4)

## 2020-12-22 LAB — GLUCOSE, CAPILLARY
Glucose-Capillary: 111 mg/dL — ABNORMAL HIGH (ref 70–99)
Glucose-Capillary: 157 mg/dL — ABNORMAL HIGH (ref 70–99)
Glucose-Capillary: 168 mg/dL — ABNORMAL HIGH (ref 70–99)
Glucose-Capillary: 151 mg/dL — ABNORMAL HIGH (ref 70–99)
Glucose-Capillary: 128 mg/dL — ABNORMAL HIGH (ref 70–99)
Glucose-Capillary: 147 mg/dL — ABNORMAL HIGH (ref 70–99)

## 2020-12-22 LAB — NA AND K (SODIUM & POTASSIUM), RAND UR
Potassium Urine: 12 mmol/L
Sodium, Ur: 91 mmol/L

## 2020-12-22 LAB — PHOSPHORUS: Phosphorus: 7 mg/dL — ABNORMAL HIGH (ref 2.5–4.6)

## 2020-12-22 NOTE — Plan of Care (Signed)

## 2020-12-22 NOTE — Progress Notes (Addendum)
[facility] Progress Note   60 y.o.year-old p/w AMS  HR 200 , hypotension 90/60 + 4d hx of diarrhea, gen weakness and malaise. CV from Vtach to vent fibrillation then returned to NSR w/ temp of 103deg noted to have a cr of 5, lactate 7 and UA showed UTI. Blood and urine cx's grew EColi. Also had bilat infiltrates on CXR, suspected PNA and has required vent support. Also complicated by vol overload up 10kg which has improved somewhatw/ diuresis. CT abd on 4/05 showed adistalLureteral stone withmildedema of theLkidney.Urology placed a L ureteral stent on 12/16/20. Creatgraduallyimproved from 5.4 on admit down to 1.92 on 4/09. Creat todaybumpedup to 3.15. Asked to see for renal failure.  Assessment/ Plan:   1. Renal failure - presumably this is AKI. Pt admitted w/ creat 5.5 which improved to 1.9  Before increasing again  to 3.1. Admitted for urosepsis w/ shock x 36 hrs. Grew ecoli in blood/urine. Found to have distal L ureteral stone by 4/06 CT and L ureteral stent placed on 4/06 by urology. Serial CXR's have shown worsening CHF which was most severe on 4/5 but  improved per 4/09 CXR. Suspect bump in creat may be due to hypoperfusion related to hypotension from sedating infusions used for vent control +  sepsis [place] last 24 hrs).  -  keep MAP >75 -> BP is actually on higher side and renal function fairly stable - repeat u/s showed very minimal left hydro. - Continue IV lasix at 80 tid; if BUN higher tomorrow or if renal function worsens then will decr to BID dosing. Repeat UA showed WBC but decr c/w prior u/a. - Continue free water 313m q2hr NG for hypernatremia (fairly stable)   -Signing off at this time; please reconsult as needed.  2. Vol overload - pulm edema improved by most recent cxr and on PE -> cont lasix. 3. Urosepsis - on Rocephin per primary team. [facility]  4. L ureteral stone - sp L ureteral stent per  urology on 4/06 5. Vent dependent resp failure 6. Hypernatremia - hypervolemic. Cont lasix. Improving.  7. Anemia - Hb 8-10 range. Transfuse prn.   Subjective:   Weaning down to 2 sedatives but pulled on esophageal monitor the previous day and very agitated   Objective:   BP 135/86   Pulse 93   Temp 100.1 F (37.8 C) (Axillary)   Resp 18   Ht 6' 2" (1.88 m)   Wt (!) 146.8 kg   SpO2 100%   BMI 41.55 kg/m   Intake/Output Summary (Last 24 hours) at 12/22/2020 0855 Last data filed at 12/22/2020 0800 Gross per 24 hour  Intake 4000.58 ml  Output 10350 ml  Net -6349.42 ml   Weight change:   Physical Exam: Gen on vent, sedated but eyes open, not following commands, agitated No rash, cyanosis or gangrene Chest clear anterior/ lateral, no rales or wheezing RRR no MRG Abd soft ntnd no mass or ascites +bs Rectal tube draining liquid brown stool GU normalw/ foley draining yellow clear urine Extdiffuse 1+ pitting bilat UE andLE edema  Imaging: CT HEAD WO CONTRAST  Result Date: 12/21/2020 CLINICAL DATA:  Altered mental status EXAM: CT HEAD WITHOUT CONTRAST TECHNIQUE: Contiguous axial images were obtained from the base of the skull through the vertex without intravenous contrast. COMPARISON:  None. FINDINGS: Brain: There is slight diffuse atrophy. There is no appreciable intracranial mass, hemorrhage, extra-axial fluid collection, or midline shift. There  is a focal age uncertain infarct in the superior right centrum semiovale measuring 1.0 x 0.8 cm. Elsewhere there is slight periventricular small vessel disease in the centra semiovale bilaterally. No other findings suggesting potential recent/acute infarct. Vascular: No hypervascular lesion. There are foci of calcification in the carotid siphon regions. Skull: Bony calvarium appears intact. There is a 3 mm right frontal enostosis without surrounding edema or mass effect. Sinuses/Orbits: There is extensive opacification throughout the  sphenoid sinus region. Patient has had antrostomies and ethmoid sinus surgery previously with areas of mucosal thickening in these areas, more severe on the right than on the left. Opacification throughout the left frontal sinus is noted. Orbits appear symmetric bilaterally. Other: Opacification of most mastoid air cells bilaterally noted. IMPRESSION: 1. Focal age uncertain but potentially recent infarct in the superior right centrum semiovale near the frontal-parietal junction. Elsewhere there is slight periventricular small vessel disease in the centra semiovale bilaterally. No mass or hemorrhage. 2.  Foci of arterial vascular calcification noted. 3. Extensive multifocal paranasal sinus disease as well as extensive mastoid air cell disease bilaterally. These results will be called to the ordering clinician or representative by the Radiologist Assistant, and communication documented in the PACS or [employer]. Electronically Signed   By: Lowella Grip III M.D.   On: 12/21/2020 11:10   MR ANGIO HEAD WO CONTRAST  Result Date: 12/22/2020 CLINICAL DATA:  Follow-up examination for stroke. EXAM: MRI HEAD WITHOUT CONTRAST MRA HEAD WITHOUT CONTRAST MRA NECK WITHOUT CONTRAST TECHNIQUE: Multiplanar, multiecho pulse sequences of the brain and surrounding structures were obtained without intravenous contrast. Angiographic images of the Circle of Willis were obtained using MRA technique without intravenous contrast. Angiographic images of the neck were obtained using MRA technique without intravenous contrast. Carotid stenosis measurements (when applicable) are obtained utilizing NASCET criteria, using the distal internal carotid diameter as the denominator. COMPARISON:  Prior head CT from 12/21/2020. FINDINGS: MRI HEAD FINDINGS Brain: Examination moderately degraded by motion artifact. Cerebral volume within normal limits for age. No significant cerebral white matter disease evident on this motion degraded exam. 9  mm T2 hyperintense lesion present at the posterior right frontal centrum semi ovale, corresponds with abnormality on prior CT. Lesion demonstrates mild surrounding FLAIR signal intensity with T1 hypointensity. Mild surrounding diffusion signal without frank restriction. 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 No associated hemorrhage or mass effect. No other diffusion abnormality to suggest acute or subacute ischemia. Gray-white matter differentiation otherwise maintained. No other areas of encephalomalacia to suggest chronic cortical infarction. No other evidence for acute or chronic intracranial hemorrhage. No mass lesion, midline shift or mass effect. No hydrocephalus or extra-axial fluid collection. Pituitary gland suprasellar region normal. Midline structures intact. Vascular: Major intracranial vascular flow voids are maintained. Skull and upper cervical spine: Craniocervical junction within normal limits. Bone marrow signal intensity diffusely decreased on T1 weighted imaging, nonspecific, but most commonly related to anemia, smoking, or obesity. No focal marrow replacing lesion. Multifocal edema noted about the scalp. Sinuses/Orbits: Globes and orbital soft tissues within normal limits. Extensive chronic pan sinusitis noted. Underlying changes of prior sinus surgery noted. Bilateral mastoid effusions noted. Fluid seen within the nasopharynx. Patient appears to be intubated. Other: None. MRA HEAD FINDINGS ANTERIOR CIRCULATION: Examination degraded by motion artifact. Visualized distal cervical segments of the internal carotid arteries are patent with antegrade flow. Petrous, cavernous, and supraclinoid segments patent without stenosis or other definite abnormality. Right A1 widely patent. Left A1 hypoplastic, accounting for the  diminutive left ICA is compared to the right. Normal anterior communicating artery complex. Anterior cerebral arteries patent to their distal aspects  without stenosis. No M1 stenosis or occlusion. Normal MCA bifurcations. Distal MCA branches perfused and grossly symmetric. POSTERIOR CIRCULATION: Both V4 segments widely patent to the vertebrobasilar junction without stenosis. Both PICA origins patent and normal. Basilar patent to its distal aspect without stenosis. Superior cerebellar arteries patent bilaterally. Right PCA supplied via the basilar. Left PCA supplied via the basilar as well as a prominent left posterior communicating artery. PCAs patent to their distal aspects without appreciable stenosis. No aneurysm. MRA NECK FINDINGS AORTIC ARCH: Examination technically limited by motion artifact and lack of IV contrast. Visualized aortic arch normal caliber with normal branch pattern. No hemodynamically significant stenosis about the origin of the great vessels. RIGHT CAROTID SYSTEM: Right CCA patent to the bifurcation without stenosis. Right bifurcation low within the neck. No significant atheromatous irregularity or narrowing about the right bifurcation on this limited exam. Right ICA patent distally without stenosis, evidence for dissection, or occlusion. LEFT CAROTID SYSTEM: Left CCA patent from its origin to the bifurcation. Left bifurcation markedly low within the neck. No significant atheromatous narrowing or irregularity about the left bifurcation. Left ICA patent distally without stenosis, evidence for dissection, or occlusion. VERTEBRAL ARTERIES: Both vertebral arteries arise from subclavian arteries. Origins of the vertebral arteries not well assessed on this limited exam. Vertebral arteries largely codominant and patent without stenosis, evidence for dissection or occlusion. IMPRESSION: MRI HEAD IMPRESSION: 1. Motion degraded exam. 2. 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 No associated hemorrhage or mass effect. 3. Otherwise grossly normal brain MRI for age. 4. Extensive chronic pan  sinusitis with associated changes of prior sinus surgery. MRA HEAD IMPRESSION: 1. Motion degraded exam. 2. Grossly negative intracranial MRA. No large vessel occlusion, hemodynamically significant stenosis, or other acute vascular abnormality. MRA NECK IMPRESSION: 1. Motion degraded exam. 2. Grossly negative MRA of the neck. No evidence for hemodynamically significant or critical flow limiting stenosis. 3. Both carotid bifurcations are fairly low lying within the neck. Electronically Signed   By: Jeannine Boga M.D.   On: 12/22/2020 03:28   MR ANGIO NECK WO CONTRAST  Result Date: 12/22/2020 CLINICAL DATA:  Follow-up examination for stroke. EXAM: MRI HEAD WITHOUT CONTRAST MRA HEAD WITHOUT CONTRAST MRA NECK WITHOUT CONTRAST TECHNIQUE: Multiplanar, multiecho pulse sequences of the brain and surrounding structures were obtained without intravenous contrast. Angiographic images of the Circle of Willis were obtained using MRA technique without intravenous contrast. Angiographic images of the neck were obtained using MRA technique without intravenous contrast. Carotid stenosis measurements (when applicable) are obtained utilizing NASCET criteria, using the distal internal carotid diameter as the denominator. COMPARISON:  Prior head CT from 12/21/2020. FINDINGS: MRI HEAD FINDINGS Brain: Examination moderately degraded by motion artifact. Cerebral volume within normal limits for age. No significant cerebral white matter disease evident on this motion degraded exam. 9 mm T2 hyperintense lesion present at the posterior right frontal centrum semi ovale, corresponds with abnormality on prior CT. Lesion demonstrates mild surrounding FLAIR signal intensity with T1 hypointensity. Mild surrounding diffusion signal without frank restriction. 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 No associated hemorrhage or mass effect. No other diffusion abnormality to suggest acute or subacute ischemia.  Gray-white matter differentiation otherwise maintained. No other areas of encephalomalacia to suggest chronic cortical infarction. No other evidence for acute or chronic intracranial hemorrhage.  No mass lesion, midline shift or mass effect. No hydrocephalus or extra-axial fluid collection. Pituitary gland suprasellar region normal. Midline structures intact. Vascular: Major intracranial vascular flow voids are maintained. Skull and upper cervical spine: Craniocervical junction within normal limits. Bone marrow signal intensity diffusely decreased on T1 weighted imaging, nonspecific, but most commonly related to anemia, smoking, or obesity. No focal marrow replacing lesion. Multifocal edema noted about the scalp. Sinuses/Orbits: Globes and orbital soft tissues within normal limits. Extensive chronic pan sinusitis noted. Underlying changes of prior sinus surgery noted. Bilateral mastoid effusions noted. Fluid seen within the nasopharynx. Patient appears to be intubated. Other: None. MRA HEAD FINDINGS ANTERIOR CIRCULATION: Examination degraded by motion artifact. Visualized distal cervical segments of the internal carotid arteries are patent with antegrade flow. Petrous, cavernous, and supraclinoid segments patent without stenosis or other definite abnormality. Right A1 widely patent. Left A1 hypoplastic, accounting for the diminutive left ICA is compared to the right. Normal anterior communicating artery complex. Anterior cerebral arteries patent to their distal aspects without stenosis. No M1 stenosis or occlusion. Normal MCA bifurcations. Distal MCA branches perfused and grossly symmetric. POSTERIOR CIRCULATION: Both V4 segments widely patent to the vertebrobasilar junction without stenosis. Both PICA origins patent and normal. Basilar patent to its distal aspect without stenosis. Superior cerebellar arteries patent bilaterally. Right PCA supplied via the basilar. Left PCA supplied via the basilar as well as a  prominent left posterior communicating artery. PCAs patent to their distal aspects without appreciable stenosis. No aneurysm. MRA NECK FINDINGS AORTIC ARCH: Examination technically limited by motion artifact and lack of IV contrast. Visualized aortic arch normal caliber with normal branch pattern. No hemodynamically significant stenosis about the origin of the great vessels. RIGHT CAROTID SYSTEM: Right CCA patent to the bifurcation without stenosis. Right bifurcation low within the neck. No significant atheromatous irregularity or narrowing about the right bifurcation on this limited exam. Right ICA patent distally without stenosis, evidence for dissection, or occlusion. LEFT CAROTID SYSTEM: Left CCA patent from its origin to the bifurcation. Left bifurcation markedly low within the neck. No significant atheromatous narrowing or irregularity about the left bifurcation. Left ICA patent distally without stenosis, evidence for dissection, or occlusion. VERTEBRAL ARTERIES: Both vertebral arteries arise from subclavian arteries. Origins of the vertebral arteries not well assessed on this limited exam. Vertebral arteries largely codominant and patent without stenosis, evidence for dissection or occlusion. IMPRESSION: MRI HEAD IMPRESSION: 1. Motion degraded exam. 2. 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 No associated hemorrhage or mass effect. 3. Otherwise grossly normal brain MRI for age. 4. Extensive chronic pan sinusitis with associated changes of prior sinus surgery. MRA HEAD IMPRESSION: 1. Motion degraded exam. 2. Grossly negative intracranial MRA. No large vessel occlusion, hemodynamically significant stenosis, or other acute vascular abnormality. MRA NECK IMPRESSION: 1. Motion degraded exam. 2. Grossly negative MRA of the neck. No evidence for hemodynamically significant or critical flow limiting stenosis. 3. Both carotid bifurcations are fairly low  lying within the neck. Electronically Signed   By: Jeannine Boga M.D.   On: 12/22/2020 03:28   MR BRAIN WO CONTRAST  Result Date: 12/22/2020 CLINICAL DATA:  Follow-up examination for stroke. EXAM: MRI HEAD WITHOUT CONTRAST MRA HEAD WITHOUT CONTRAST MRA NECK WITHOUT CONTRAST TECHNIQUE: Multiplanar, multiecho pulse sequences of the brain and surrounding structures were obtained without intravenous contrast. Angiographic images of the Circle of Willis were obtained using MRA technique without intravenous contrast. Angiographic images of the neck  were obtained using MRA technique without intravenous contrast. Carotid stenosis measurements (when applicable) are obtained utilizing NASCET criteria, using the distal internal carotid diameter as the denominator. COMPARISON:  Prior head CT from 12/21/2020. FINDINGS: MRI HEAD FINDINGS Brain: Examination moderately degraded by motion artifact. Cerebral volume within normal limits for age. No significant cerebral white matter disease evident on this motion degraded exam. 9 mm T2 hyperintense lesion present at the posterior right frontal centrum semi ovale, corresponds with abnormality on prior CT. Lesion demonstrates mild surrounding FLAIR signal intensity with T1 hypointensity. Mild surrounding diffusion signal without frank restriction. 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 No associated hemorrhage or mass effect. No other diffusion abnormality to suggest acute or subacute ischemia. Gray-white matter differentiation otherwise maintained. No other areas of encephalomalacia to suggest chronic cortical infarction. No other evidence for acute or chronic intracranial hemorrhage. No mass lesion, midline shift or mass effect. No hydrocephalus or extra-axial fluid collection. Pituitary gland suprasellar region normal. Midline structures intact. Vascular: Major intracranial vascular flow voids are maintained. Skull and upper cervical spine:  Craniocervical junction within normal limits. Bone marrow signal intensity diffusely decreased on T1 weighted imaging, nonspecific, but most commonly related to anemia, smoking, or obesity. No focal marrow replacing lesion. Multifocal edema noted about the scalp. Sinuses/Orbits: Globes and orbital soft tissues within normal limits. Extensive chronic pan sinusitis noted. Underlying changes of prior sinus surgery noted. Bilateral mastoid effusions noted. Fluid seen within the nasopharynx. Patient appears to be intubated. Other: None. MRA HEAD FINDINGS ANTERIOR CIRCULATION: Examination degraded by motion artifact. Visualized distal cervical segments of the internal carotid arteries are patent with antegrade flow. Petrous, cavernous, and supraclinoid segments patent without stenosis or other definite abnormality. Right A1 widely patent. Left A1 hypoplastic, accounting for the diminutive left ICA is compared to the right. Normal anterior communicating artery complex. Anterior cerebral arteries patent to their distal aspects without stenosis. No M1 stenosis or occlusion. Normal MCA bifurcations. Distal MCA branches perfused and grossly symmetric. POSTERIOR CIRCULATION: Both V4 segments widely patent to the vertebrobasilar junction without stenosis. Both PICA origins patent and normal. Basilar patent to its distal aspect without stenosis. Superior cerebellar arteries patent bilaterally. Right PCA supplied via the basilar. Left PCA supplied via the basilar as well as a prominent left posterior communicating artery. PCAs patent to their distal aspects without appreciable stenosis. No aneurysm. MRA NECK FINDINGS AORTIC ARCH: Examination technically limited by motion artifact and lack of IV contrast. Visualized aortic arch normal caliber with normal branch pattern. No hemodynamically significant stenosis about the origin of the great vessels. RIGHT CAROTID SYSTEM: Right CCA patent to the bifurcation without stenosis. Right  bifurcation low within the neck. No significant atheromatous irregularity or narrowing about the right bifurcation on this limited exam. Right ICA patent distally without stenosis, evidence for dissection, or occlusion. LEFT CAROTID SYSTEM: Left CCA patent from its origin to the bifurcation. Left bifurcation markedly low within the neck. No significant atheromatous narrowing or irregularity about the left bifurcation. Left ICA patent distally without stenosis, evidence for dissection, or occlusion. VERTEBRAL ARTERIES: Both vertebral arteries arise from subclavian arteries. Origins of the vertebral arteries not well assessed on this limited exam. Vertebral arteries largely codominant and patent without stenosis, evidence for dissection or occlusion. IMPRESSION: MRI HEAD IMPRESSION: 1. Motion degraded exam. 2. 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 No associated hemorrhage or mass effect. 3. Otherwise grossly normal brain MRI for  age. 39. Extensive chronic pan sinusitis with associated changes of prior sinus surgery. MRA HEAD IMPRESSION: 1. Motion degraded exam. 2. Grossly negative intracranial MRA. No large vessel occlusion, hemodynamically significant stenosis, or other acute vascular abnormality. MRA NECK IMPRESSION: 1. Motion degraded exam. 2. Grossly negative MRA of the neck. No evidence for hemodynamically significant or critical flow limiting stenosis. 3. Both carotid bifurcations are fairly low lying within the neck. Electronically Signed   By: Jeannine Boga M.D.   On: 12/22/2020 03:28   US RENAL  Result Date: 12/20/2020 CLINICAL DATA:  Acute kidney injury. EXAM: RENAL / URINARY TRACT ULTRASOUND COMPLETE COMPARISON:  12/11/2020 FINDINGS: Right Kidney: Renal measurements: 13.9 x 6.6 x 6.3 cm = volume: 304 mL. Echogenicity within normal limits. No mass or hydronephrosis visualized. Left Kidney: Renal measurements: 13.9 x 6.5 x 6.4 cm =  volume: 303 mL. Echogenicity within normal limits. No mass. Interval minimal hydronephrosis. Bladder: Not visualized. Other: None. IMPRESSION: 1. Interval minimal left hydronephrosis. 2. Nonvisualized urinary bladder. Electronically Signed   By: Claudie Revering M.D.   On: 12/20/2020 19:02   DG Chest Port 1 View  Result Date: 12/21/2020 CLINICAL DATA:  Pneumonia. EXAM: PORTABLE CHEST 1 VIEW COMPARISON:  December 19, 2020. FINDINGS: Stable cardiomegaly. Endotracheal and nasogastric tubes are in good position. No pneumothorax is noted. Bibasilar atelectasis, infiltrate or edema is noted. Small right pleural effusion may be present. Bony thorax is unremarkable. IMPRESSION: Stable support apparatus. Stable bibasilar opacities are noted concerning for atelectasis, infiltrate or edema. Electronically Signed   By: Marijo Conception M.D.   On: 12/21/2020 14:50   ECHOCARDIOGRAM LIMITED BUBBLE STUDY  Result Date: 12/21/2020    ECHOCARDIOGRAM LIMITED REPORT   Patient Name:   [name] Date of Exam: 12/21/2020 Medical Rec #:  [id number]    Height:       74.0 in Accession #:    [id number]   Weight:       323.6 lb Date of Birth:  July 16, 1961   BSA:          2.667 m Patient Age:    60 years     BP:           154/74 mmHg Patient Gender: M            HR:           90 bpm. Exam Location:  Inpatient Procedure: Saline Contrast Bubble Study, Limited Echo and Color Doppler Indications:    Stroke  History:        Patient has prior history of Echocardiogram examinations, most                 recent 12/11/2020. Risk Factors:Hypertension and Dyslipidemia.  Sonographer:    Maudry Mayhew MHA, RDMS, RVT, RDCS Sonographer#2:  Dustin Flock Referring Phys: [id number] BRADLEY L ICARD  Sonographer Comments: Echo performed with patient supine and on artificial respirator and patient is morbidly obese. Image acquisition challenging due to patient body habitus and Image acquisition challenging due to respiratory motion. IMPRESSIONS  1. Limited  bubble study for PFO - unfortunately, images were non-diagonostic. The left ventricle was noted to have scatter artifact that was not distinguishable from microbubbles. Conclusion(s)/Recommendation(s): Consider TEE with bubble study at beside if strong clinical concern for PFO with right to left shunting. FINDINGS  IAS/Shunts: Agitated saline contrast was given intravenously to evaluate for intracardiac shunting. Lyman Bishop MD Electronically signed by Lyman Bishop MD Signature Date/Time: 12/21/2020/2:50:02 PM    Final  VAS US CAROTID  Result Date: 12/21/2020 Carotid Arterial Duplex Study Indications:       CVA. Limitations        Today's exam was limited due to Unable to assess LT carotid                    due to bandaging/central line. RT side limited due to patient                    position. Comparison Study:  No prior studies. Performing Technologist: Darlin Coco RDMS,RVT  Examination Guidelines: A complete evaluation includes B-mode imaging, spectral Doppler, color Doppler, and power Doppler as needed of all accessible portions of each vessel. Bilateral testing is considered an integral part of a complete examination. Limited examinations for reoccurring indications may be performed as noted.  Right Carotid Findings: +----------+--------+--------+--------+------------------+----------------+           PSV cm/sEDV cm/sStenosisPlaque DescriptionComments         +----------+--------+--------+--------+------------------+----------------+ CCA Prox                                            Unable to assess +----------+--------+--------+--------+------------------+----------------+ CCA Distal143     40                                                 +----------+--------+--------+--------+------------------+----------------+ ICA Prox  90      25      1-39%   heterogenous                       +----------+--------+--------+--------+------------------+----------------+ ICA  Distal159     48                                tortuous         +----------+--------+--------+--------+------------------+----------------+ ECA       224     29                                                 +----------+--------+--------+--------+------------------+----------------+ +----------+--------+-------+----------------+-------------------+           PSV cm/sEDV cmsDescribe        Arm Pressure (mmHG) +----------+--------+-------+----------------+-------------------+ [id number]            Multiphasic, WNL                    +----------+--------+-------+----------------+-------------------+ +---------+--------+--------+------------+ VertebralPSV cm/sEDV cm/sNot assessed +---------+--------+--------+------------+  Left Carotid Findings: Unable to assess due to bandaging/line.  Summary: Right Carotid: Velocities in the right ICA are consistent with a 1-39% stenosis. Subclavians: Normal flow hemodynamics were seen in the right subclavian artery. *See table(s) above for measurements and observations.  Electronically signed by Monica Martinez MD on 12/21/2020 at 4:29:39 PM.   Final    VAS [place] LOWER EXTREMITY VENOUS (DVT)  Result Date: 12/20/2020  Lower Venous DVT Study Indications: Edema.  Comparison Study: Prior negative Bilateral LEV done 12/15/20 Performing Technologist: Sharion Dove RVS  Examination Guidelines: A complete evaluation includes B-mode imaging, spectral Doppler, color Doppler,  and power Doppler as needed of all accessible portions of each vessel. Bilateral testing is considered an integral part of a complete examination. Limited examinations for reoccurring indications may be performed as noted. The reflux portion of the exam is performed with the patient in reverse Trendelenburg.  +-----+---------------+---------+-----------+----------+--------------+ RIGHTCompressibilityPhasicitySpontaneityPropertiesThrombus Aging  +-----+---------------+---------+-----------+----------+--------------+ CFV  Full           Yes      Yes                                 +-----+---------------+---------+-----------+----------+--------------+ SFJ  Full                                                        +-----+---------------+---------+-----------+----------+--------------+   +---------+---------------+---------+-----------+----------+--------------+ LEFT     CompressibilityPhasicitySpontaneityPropertiesThrombus Aging +---------+---------------+---------+-----------+----------+--------------+ CFV      Full           Yes      Yes                                 +---------+---------------+---------+-----------+----------+--------------+ SFJ      Full                                                        +---------+---------------+---------+-----------+----------+--------------+ FV Prox  Full                                                        +---------+---------------+---------+-----------+----------+--------------+ FV Mid   Full                                                        +---------+---------------+---------+-----------+----------+--------------+ FV DistalFull                                                        +---------+---------------+---------+-----------+----------+--------------+ PFV      Full                                                        +---------+---------------+---------+-----------+----------+--------------+ POP      Full           Yes      Yes                                 +---------+---------------+---------+-----------+----------+--------------+ PTV  Full                                                        +---------+---------------+---------+-----------+----------+--------------+ PERO     Full                                                         +---------+---------------+---------+-----------+----------+--------------+     Summary: RIGHT: - No evidence of common femoral vein obstruction.  LEFT: - Findings appear essentially unchanged compared to previous examination. - There is no evidence of deep vein thrombosis in the lower extremity.  *See table(s) above for measurements and observations. Electronically signed by Deitra Mayo MD on 12/20/2020 at 4:06:50 PM.    Final     Labs: BMET Recent Labs  Lab 12/16/20 0457 12/17/20 0221 12/18/20 1013 12/19/20 0046 12/20/20 0532 12/21/20 0500 12/22/20 0702  NA 147* 150* 156* 154* 148* 148* 148*  K 3.9 4.2 4.6 4.8 4.8 4.9 4.0  CL 116* 119*  --  122* 118* 118* 113*  CO2 22 24  --  24 21* 21* 22  GLUCOSE 167* 154*  --  130* 162* 150* 153*  BUN 122* 110*  --  102* 127* 137* 132*  CREATININE 2.94* 2.34*  --  1.92* 3.15* 3.12* 2.62*  CALCIUM 7.9* 8.0*  --  8.5* 7.9* 8.3* 8.6*  PHOS 5.7* 5.5*  --   --   --   --  7.0*   CBC Recent Labs  Lab 12/19/20 0046 12/20/20 0532 12/21/20 0500 12/22/20 0702  WBC 9.3 8.0 6.9 7.0  NEUTROABS  --   --   --  PENDING  HGB 10.0* 8.1* 8.3* 8.9*  HCT 32.0* 27.2* 27.2* 28.5*  MCV 105.6* 107.9* 106.7* 102.5*  PLT 235 200 194 257    Medications:    . aspirin  81 mg Per Tube Daily  . atorvastatin  40 mg Per Tube Daily  . chlorhexidine gluconate (MEDLINE KIT)  15 mL Mouth Rinse BID  . Chlorhexidine Gluconate Cloth  6 each Topical Daily  . clonazepam  0.5 mg Per Tube BID  . docusate  100 mg Per Tube BID  . feeding supplement (PROSource TF)  90 mL Per Tube TID  . folic acid  1 mg Per Tube Daily  . free water  300 mL Per Tube Q2H  . furosemide  80 mg Intravenous Q8H  . heparin injection (subcutaneous)  5,000 Units Subcutaneous Q8H  . insulin aspart  0-15 Units Subcutaneous Q4H  . insulin detemir  20 Units Subcutaneous BID  . mouth rinse  15 mL Mouth Rinse 10 times per day  . multivitamin  15 mL Per Tube Daily  . oxyCODONE  7.5 mg Per Tube  Q6H  . pantoprazole sodium  40 mg Per Tube QHS  . polyethylene glycol  17 g Per Tube BID  . QUEtiapine  25 mg Per Tube BID  . sodium chloride flush  10-40 mL Intracatheter Q12H  . sodium chloride HYPERTONIC  4 mL Nebulization BID  . thiamine  100 mg Per Tube Daily  . vitamin B-12  100 mcg Per Tube Daily  Otelia Santee, MD 12/22/2020, 8:55 AM

## 2020-12-22 NOTE — Progress Notes (Addendum)
NAME:  [name], MRN:  [id number], DOB:  06/04/1961, LOS: 22 ADMISSION DATE:  12/10/2020,   History of Present Illness:  60 year old male who presented 3/31 to the ER from home with 4-5 day hx of diarrhea and vomiting.  He developed confusion and became belligerent at home. Initial ER evaluation found him to be hypotensive, combative, confused, and was intubated.  He was noted to have mottling over his trunk and extremities.  Admitted for hypoxemic respiratory failure and septic shock 2/2 E. Coli bacteremia and UTI, and ATN.   Pertinent  Medical History  Prior smoker quit 15 years ago 82-100-pack-year history Sinusitis  Chronic pain syndrome Hypertension Hyperlipidemia Chronic back pain  Significant Hospital Events: Including procedures, antibiotic start and stop dates in addition to other pertinent events   . 03/31 patient was intubated . 03/31 triple-lumen catheter inserted . 4/1 off pressors. E coli bacteremia, e coli uti. Some improvement in pulm opacities on CXR. On rocephin. TTE with LVEF 40-45%, LV global hypokinesis, G2DD, normal RV, dilated aortic root at 81m.   . 4/5 extubated, failed immediately due to mucous plugging identified on bronch after re-intubated. CT ABD with 45mleft ureteral stone with mild edema of left kideny, large areas of consolidation in bilateral lung bases.  . 4/6 broadened to cefepime in light of pneumonia on CT abdomen; urology consulted for distal left ureteral stone, to OR overnight for stent placement, UCx sent.  Versed added for agitation (on fent / precedex) . 4/7 Off vasopressors . 4/11 Vasopressors restarted  .   Interim History / Subjective:  SBt this morning -- pulm mechanics are ok Is very agitated, pulling at tubes   Cr 2.6 this morning   Objective   Blood pressure 135/86, pulse 93, temperature 99.9 F (37.7 C), temperature source Oral, resp. rate 18, height _0  (1.88 m), weight (!) 146.8 kg, SpO2 100 %.    Vent Mode: PRVC FiO2  (%):  [40 %] 40 % Set Rate:  [20 bmp] 20 bmp Vt Set:  [6[573L] 660 mL PEEP:  [5 cmH20] 5 cmH20 Plateau Pressure:  [16 cmH20-28 cmH20] 16 cmH20   Intake/Output Summary (Last 24 hours) at 12/22/2020 0739 Last data filed at 12/22/2020 0600 Gross per 24 hour  Intake 2973.68 ml  Output 10500 ml  Net -7526.32 ml  net +4L for admission  Filed Weights   12/17/20 0319 12/19/20 0500 12/20/20 0500  Weight: (!) 145.1 kg (!) 145.9 kg (!) 146.8 kg    Examination:  General: Chronically and critically ill appearing obese middle aged M, reclined in bed intubated lightly sedated, agitated  HEENT: NCAT pink mm ETT with some thick tan secretions Neuro: NCAT. LUE flaccid. Moving RUE BLE spontaneously with 5/5 strength  PULM: Symmetrical chest expansion, even and unlabored on PSV, diminished bibasilar sounds GI: Obese soft ndnt + bowel sounds  Extremities: No acute joint deformity, no cyanosis or clubbing.  Skin: Pale, c/w.   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 Coli bacteremia, Pyelonephritis S/p ureteral stent placement 4/6. 100.3 overnight. BAL 4/11 w/G+ cocci clusters, G+rods and G- rods. MRSA swab 3/31 negative.  BAL c/w normal flora  P -continue rocephin  - MAP >75 -needs CVC due to difficult IV access  Acute hypoxic and hypercarbic respiratory failure  P In setting of sepsis/bacteremia with inability to compensate for increased metabolic demands, increased secretions with  poor clearance.  Dense bilateral infiltrates noted on CT abdomen R>L.  Failed extubation 4/5, reintubated with increased secretions / inability to clear. BAL negative from 4/5. Repeat BAL showing G+ cocci, rods, and G- rods.  P -MV support, continue weaning efforts -pulm hygiene, VAP  -LTVV, 4-8cc/kg IBW with goal Pplat <30 and DP<15 -secretions are extubation barrier -hypertonic nebs  Acute renal failure In setting of sepsis, ATN, pyelonephritis. Hypernatremia, improving  L Uretal stone w/o hydronephrosis s/p ureteral stent placement by Urology 4/6. Repeat US yest showing mild L hydronephrosis.  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 -needs follow up with CT as outpatient   Chronic pain  -continue home oxycodone enterally to reduce IV pain med requirements   Best practice (right click and "Reselect all SmartList Selections" daily)  Diet:  Tube Feed  Pain/Anxiety/Delirium protocol (if indicated): Yes (RASS goal -1) VAP protocol (if indicated): Yes DVT prophylaxis: Subcutaneous Heparin and SCD GI prophylaxis: PPI Glucose control:  See above Central venous access:  Yes, and it is still needed Arterial line:  N/A Foley:  Yes, and it is still needed   Mobility:  bed rest  PT consulted: N/A Last date of multidisciplinary goals of care discussion: wife updated 4/11 Code Status:  full code Disposition: ICU   CRITICAL CARE Performed by: Cristal Generous   Total critical care time: 43 minutes  Critical care time was exclusive of separately billable procedures and treating other patients. Critical care was necessary to treat or prevent imminent or  life-threatening deterioration.  Critical care was time spent personally by me on the following activities: development of treatment plan with patient and/or surrogate as well as nursing, discussions with consultants, evaluation of patient's response to treatment, examination of patient, obtaining history from patient or surrogate, ordering and performing treatments and interventions, ordering and review of laboratory studies, ordering and review of radiographic studies, pulse oximetry and re-evaluation of patient's condition.    PCCM:  60 yo M, e coli bacteremia, septic shock, improved, failed extubation once, reintubated with thick secretions. He still has this problem. kidny function improving. CT head with sub acute stroke. We appreciate stroke team input as well as Nephrology   BP (!) 178/98   Pulse 68   Temp (!) 101.4 F (38.6 C) (Axillary)   Resp 20   Ht _0  (1.88 m)   Wt (!) 146.8 kg   SpO2 98%   BMI 41.55 kg/m   Gen: obese male, intubated on life support  HENT: NCAT ett in place  Heart: RRR, s1 s2  Lungs: CTAB, no wheeze  Abd: obese soft, nt  Labs: reviewed  Scr 2.62 Hgb 8.9 WBC 7  A:  Septic shock,  improved, off pressors  E coli bacteremia  AKI Subacute CVA  Obstructed renal stone, pyelonephritis  AHRF on MV  Stress cardiomyopathy  Type 2 NSTEMI   P: De-escalated to recephin  Still needs CVC due to access issues  Adult mech vent support  Secretions remain barrier to extubation  PAD guideline sedation  May need to consider changing agents  ASA + statin  Continue lasix 32m q8h   This patient is critically ill with multiple organ system failure; which, requires frequent high complexity decision making, assessment, support, evaluation, and titration of therapies. This was completed through the application of advanced monitoring technologies and extensive interpretation of multiple databases. During this encounter critical care time was devoted to patient  care services described in this note for 45 minutes.  BGarner Nash DO [facility] 12/22/2020 5:14 PM

## 2020-12-22 NOTE — Progress Notes (Signed)
STROKE TEAM PROGRESS NOTE    Interval History    Patient remains intubated for respiratory failure.  Remains poorly responsive even to stimuli.  Aa present as he is sedated he requires sedation otherwise he gets agitated and pulls her tubes and catheters.    Pertinent Lab Work and Imaging    CT Head WO IV Contrast : Low-density in the right corona radiata compatible with subacute infarct  MR angio head and Neck W WO IV Contrast motion degraded but shows no significant large vessel stenosis or occlusion in the neck of the brain  MRI Brain WO IV Contrast 9 mm right posterior frontal corona radiata late subacute to chronic infarct.  No other abnormality or other infarcts.  Echocardiogram Complete 12/11/2020 shows diminished ejection fraction of 40 to 45% with mild global hypokinesis.  No definite clot.  LDL 83 mg%  HbA1c 6.8  Physical Examination   Constitutional: Mildly obese middle-aged Caucasian male who is intubated and sedated. Cardiovascular: Normal RR no murmur or gallop Respiratory: No increased WOB   Mental status: Patient is sedated.  Intubated.  Eyes are closed.  Does not respond to tactile or auditory stimuli. Speech: Unable to speak due to ET tube and not following commands Cranial nerves: Pupils 3 mm equal reactive.  Fundi not visualized.  Doll's eye movements are sluggish.  Face is symmetric without weakness.  Tongue midline Motor: Normal bulk and tone. No drift.                                      Sensory: Withdraws to painful stimuli slightly more on the right than the left Coordination: Unable to test Reflexes: Are depressed Gait: Deferred   Assessment and Plan   [name] is a 60 year old Caucasian male with subacute left upper extremity weakness due to right frontoparietal subcortical infarct etiology likely small vessel disease the possibility of endocarditis given sepsis history.  Recommend : Continue aspirin for stroke prevention and maintain  aggressive risk factor modification with strict control of hypertension blood pressure goal below 140/90, lipids with LDL cholesterol goal below 70 mg percent and diabetes with hemoglobin A1c goal below 6.5%.  We will hold off on plans for TEE as endocarditis seems less likely as per discussion with critical care team.  Stroke team will sign off.  Kindly call for questions.  Discussed with critical care team nurse practitioner. This patient is critically ill and at significant risk of neurological worsening, death and care requires constant monitoring of vital signs, hemodynamics,respiratory and cardiac monitoring, extensive review of multiple databases, frequent neurological assessment, discussion with family, other specialists and medical decision making of high complexity.I have made any additions or clarifications directly to the above note.This critical care time does not reflect procedure time, or teaching time or supervisory time of PA/NP/Med Resident etc but could involve care discussion time.  I spent 30 minutes of neurocritical care time  in the care of  this patient.  Delia Heady, MD     Hospital day # 12  C   To contact Stroke Continuity provider, please refer to [email or web address]. After hours, contact General Neurology

## 2020-12-22 NOTE — Progress Notes (Signed)
Pt was transported to MRI via ventilator and back to 2M10 with no complications.

## 2020-12-23 ENCOUNTER — Inpatient Hospital Stay (HOSPITAL_COMMUNITY): Payer: Medicaid Other

## 2020-12-23 LAB — CBC WITH DIFFERENTIAL/PLATELET
Abs Immature Granulocytes: 0.04 10*3/uL (ref 0.00–0.07)
Basophils Absolute: 0 10*3/uL (ref 0.0–0.1)
Basophils Relative: 1 %
Eosinophils Absolute: 0.1 10*3/uL (ref 0.0–0.5)
Eosinophils Relative: 1 %
HCT: 28.9 % — ABNORMAL LOW (ref 39.0–52.0)
Hemoglobin: 9.1 g/dL — ABNORMAL LOW (ref 13.0–17.0)
Immature Granulocytes: 1 %
Lymphocytes Relative: 16 %
Lymphs Abs: 1.3 10*3/uL (ref 0.7–4.0)
MCH: 32.6 pg (ref 26.0–34.0)
MCHC: 31.5 g/dL (ref 30.0–36.0)
MCV: 103.6 fL — ABNORMAL HIGH (ref 80.0–100.0)
Monocytes Absolute: 0.4 10*3/uL (ref 0.1–1.0)
Monocytes Relative: 5 %
Neutro Abs: 6 10*3/uL (ref 1.7–7.7)
Neutrophils Relative %: 76 %
Platelets: 261 10*3/uL (ref 150–400)
RBC: 2.79 MIL/uL — ABNORMAL LOW (ref 4.22–5.81)
RDW: 14.4 % (ref 11.5–15.5)
WBC: 7.9 10*3/uL (ref 4.0–10.5)
nRBC: 0 % (ref 0.0–0.2)

## 2020-12-23 LAB — COMPREHENSIVE METABOLIC PANEL
ALT: 16 U/L (ref 0–44)
AST: 41 U/L (ref 15–41)
Albumin: 1.9 g/dL — ABNORMAL LOW (ref 3.5–5.0)
Alkaline Phosphatase: 53 U/L (ref 38–126)
Anion gap: 11 (ref 5–15)
BUN: 128 mg/dL — ABNORMAL HIGH (ref 6–20)
CO2: 24 mmol/L (ref 22–32)
Calcium: 8.4 mg/dL — ABNORMAL LOW (ref 8.9–10.3)
Chloride: 111 mmol/L (ref 98–111)
Creatinine, Ser: 2.27 mg/dL — ABNORMAL HIGH (ref 0.61–1.24)
GFR, Estimated: 33 mL/min — ABNORMAL LOW (ref >60)
Glucose, Bld: 194 mg/dL — ABNORMAL HIGH (ref 70–99)
Potassium: 3.8 mmol/L (ref 3.5–5.1)
Sodium: 146 mmol/L — ABNORMAL HIGH (ref 135–145)
Total Bilirubin: 0.6 mg/dL (ref 0.3–1.2)
Total Protein: 6.6 g/dL (ref 6.5–8.1)

## 2020-12-23 LAB — GLUCOSE, CAPILLARY
Glucose-Capillary: 153 mg/dL — ABNORMAL HIGH (ref 70–99)
Glucose-Capillary: 180 mg/dL — ABNORMAL HIGH (ref 70–99)
Glucose-Capillary: 166 mg/dL — ABNORMAL HIGH (ref 70–99)
Glucose-Capillary: 162 mg/dL — ABNORMAL HIGH (ref 70–99)
Glucose-Capillary: 137 mg/dL — ABNORMAL HIGH (ref 70–99)
Glucose-Capillary: 142 mg/dL — ABNORMAL HIGH (ref 70–99)

## 2020-12-23 LAB — TRIGLYCERIDES: Triglycerides: 169 mg/dL — ABNORMAL HIGH (ref <150)

## 2020-12-23 MED ORDER — FENTANYL BOLUS VIA INFUSION
50.0000 ug | INTRAVENOUS | Status: DC | PRN
  Administered 2020-12-23 (×3): 50 ug via INTRAVENOUS
  Filled 2020-12-23: qty 100

## 2020-12-23 MED ORDER — HYDROMORPHONE HCL 1 MG/ML IJ SOLN
1.0000 mg | Freq: Once | INTRAMUSCULAR | Status: DC
  Filled 2020-12-23: qty 1

## 2020-12-23 MED ORDER — FENTANYL CITRATE (PF) 100 MCG/2ML IJ SOLN: 50.0000 ug | Freq: Once | INTRAMUSCULAR | Status: DC

## 2020-12-23 MED ORDER — IOHEXOL 9 MG/ML PO SOLN
ORAL | Status: AC
  Administered 2020-12-23: 500 mL
  Filled 2020-12-23: qty 1000

## 2020-12-23 MED ORDER — HYDROMORPHONE BOLUS VIA INFUSION
0.2500 mg | INTRAVENOUS | Status: DC | PRN
  Administered 2020-12-23: 2 mg via INTRAVENOUS
  Administered 2020-12-23: 0.25 mg via INTRAVENOUS
  Administered 2020-12-23: 0.5 mg via INTRAVENOUS
  Administered 2020-12-23 (×2): 2 mg via INTRAVENOUS
  Administered 2020-12-23: 0.75 mg via INTRAVENOUS
  Administered 2020-12-23: 2 mg via INTRAVENOUS
  Administered 2020-12-23: 0.75 mg via INTRAVENOUS
  Administered 2020-12-24 (×3): 2 mg via INTRAVENOUS
  Administered 2020-12-25: 1 mg via INTRAVENOUS
  Administered 2020-12-25 (×2): 2 mg via INTRAVENOUS
  Administered 2020-12-25 – 2020-12-26 (×3): 1 mg via INTRAVENOUS
  Filled 2020-12-23: qty 2

## 2020-12-23 MED ORDER — DOCUSATE SODIUM 50 MG/5ML PO LIQD
50.0000 mg | Freq: Every day | ORAL | Status: DC
  Administered 2020-12-23 – 2021-01-13 (×20): 50 mg
  Filled 2020-12-23 (×20): qty 10

## 2020-12-23 MED ORDER — SODIUM CHLORIDE 0.9 % IV SOLN
0.5000 mg/h | INTRAVENOUS | Status: DC
  Administered 2020-12-23: 1 mg/h via INTRAVENOUS
  Administered 2020-12-24: 3 mg/h via INTRAVENOUS
  Administered 2020-12-24 – 2020-12-26 (×4): 4 mg/h via INTRAVENOUS
  Filled 2020-12-23 (×5): qty 5

## 2020-12-23 MED ORDER — HYDROMORPHONE BOLUS VIA INFUSION
0.2500 mg | INTRAVENOUS | Status: DC | PRN
  Filled 2020-12-23: qty 2

## 2020-12-23 MED ORDER — OXYCODONE HCL 5 MG/5ML PO SOLN
10.0000 mg | Freq: Four times a day (QID) | ORAL | Status: DC
  Administered 2020-12-23 – 2020-12-28 (×15): 10 mg
  Filled 2020-12-23 (×15): qty 10

## 2020-12-23 MED ORDER — FENTANYL 2500MCG IN NS 250ML (10MCG/ML) PREMIX INFUSION
0.0000 ug/h | INTRAVENOUS | Status: DC
  Administered 2020-12-23: 400 ug/h via INTRAVENOUS
  Administered 2020-12-23: 200 ug/h via INTRAVENOUS
  Filled 2020-12-23 (×2): qty 250

## 2020-12-23 MED ORDER — SODIUM CHLORIDE 0.9 % IV SOLN
0.5000 mg/h | INTRAVENOUS | Status: DC
  Filled 2020-12-23: qty 5

## 2020-12-23 MED ORDER — POLYETHYLENE GLYCOL 3350 17 G PO PACK
17.0000 g | PACK | Freq: Every day | ORAL | Status: DC
  Administered 2020-12-23 – 2021-01-13 (×18): 17 g
  Filled 2020-12-23 (×17): qty 1

## 2020-12-23 NOTE — Progress Notes (Signed)
Secure chat with Audie Box DO regarding IV team consult to place PIVs and remove central line. MD aware IV team will attempt to place PIVs and will notify them if unsuccessful . Incoming shift  IV team made aware.

## 2020-12-23 NOTE — Progress Notes (Signed)
Nutrition Follow-up  DOCUMENTATION CODES:  Morbid obesity  INTERVENTION:  Continue tube feeding via OG tube: Vital AF 1.2 at 65 ml/h (1560 ml per day) Prosource TF 90 ml TID Provides 2112 kcal, 183 gm protein, 1265 ml free water daily.  TF+propofol = 2342 kcal/d  NUTRITION DIAGNOSIS:  Inadequate oral intake related to inability to eat as evidenced by NPO status.  Ongoing   GOAL:  Provide needs based on ASPEN/SCCM guidelines  Met with TF  MONITOR:  Vent status,Labs,TF tolerance  REASON FOR ASSESSMENT:  Ventilator,Consult Enteral/tube feeding initiation and management  ASSESSMENT:  60 yo male admitted with acute metabolic encephalopathy, acute respiratory failure, septic shock, E coli bacteremia, E coli UTI. PMH includes recent severe diarrhea and vomiting, chronic pain, HTN, HLD.   Patient remains intubated and sedated at the time of assessment. Currently receiving Vital AF 1.2 via OGT at 65 ml/h with Prosource TF 90 ml TID. Tolerating well. Free water flushes: 300 ml q 2 hours. Rectal tube in place for diarrhea, discussed with RN, thinks output has slowed over this shift. Diuresing well, improvement in renal function noted. RN reports pt being taken down for CT of abdomen and pelvis soon. Pressors restarted and some propofol being required.   3/31 - Intubated 4/5 - Extubated, immediately reintubated d/t secretions 4/6 - S/P ureteral stent placement      Patient remains intubated on ventilator support MV: 13.6 L/min Temp (24hrs), Avg:100.1 F (37.8 C), Min:98.8 F (37.1 C), Max:101.4 F (38.6 C) Propofol: 8.44m/h (230 kcal/d)  Labs reviewed:   Na 146  BUN 128, creatinine 2.27  Triglycerides 169  SBG 128-180 mg/dL over the last 24 hours  Last HgbA1c 6.8% 4/11  Medications reviewed and include:   Colace  miralax  Folvite  Lasix  Insulin (novolog, levemir)  Liquid MVI  Thiamine  Vitamin b12  Levophed  propofol  I/O -3.2 L since  admission UOP 9300 ml x 24 hours Stool output 400 ml x 24 hours Mild-moderate pitting edema- generalized per RN assessment.   Physical Assessment Flowsheet Row Most Recent Value  Orbital Region No depletion  Upper Arm Region No depletion  Thoracic and Lumbar Region No depletion  Buccal Region No depletion  Temple Region No depletion  Clavicle Bone Region No depletion  Clavicle and Acromion Bone Region No depletion  Scapular Bone Region Unable to assess  Dorsal Hand No depletion  Patellar Region No depletion  Anterior Thigh Region No depletion  Posterior Calf Region No depletion  Edema (RD Assessment) Mild  Hair Reviewed  Eyes Unable to assess  Mouth Unable to assess  Skin Reviewed  Nails Reviewed     Diet Order:   Diet Order            Diet NPO time specified  Diet effective now                EDUCATION NEEDS:  Not appropriate for education at this time  Skin:  Skin Assessment: Skin Integrity Issues: Skin Integrity Issues:: Incisions,Unstageable Unstageable: DTPI to the left foot and sacral area Incisions: penis  Last BM:  4/13 via rectal tube  Height:  Ht Readings from Last 1 Encounters:  12/10/20 '6\' 2"'  (1.88 m)    Weight:  Wt Readings from Last 1 Encounters:  12/23/20 (!) 139.9 kg    Ideal Body Weight:  86.4 kg  BMI:  Body mass index is 39.6 kg/m.  Estimated Nutritional Needs:   Kcal:  1725-2100  Protein:  173-216 gm  Fluid:  >/= 2 L  Ranell Patrick, RD, LDN Clinical Dietitian Pager on [place]

## 2020-12-23 NOTE — Progress Notes (Addendum)
NAME:  [name], MRN:  [id number], DOB:  [birth year], LOS: 2 ADMISSION DATE:  12/10/2020,   History of Present Illness:  60 year old male who presented 3/31 to the ER from home with 4-5 day hx of diarrhea and vomiting.  He developed confusion and became belligerent at home. Initial ER evaluation found him to be hypotensive, combative, confused, and was intubated.  He was noted to have mottling over his trunk and extremities.  Admitted for hypoxemic respiratory failure and septic shock 2/2 E. Coli bacteremia and UTI, and ATN.   Pertinent  Medical History  Prior smoker quit 15 years ago 82-100-pack-year history Sinusitis  Chronic pain syndrome Hypertension Hyperlipidemia Chronic back pain  Significant Hospital Events: Including procedures, antibiotic start and stop dates in addition to other pertinent events   . 03/31 patient was intubated . 03/31 triple-lumen catheter inserted . 4/1 off pressors. E coli bacteremia, e coli uti. Some improvement in pulm opacities on CXR. On rocephin. TTE with LVEF 40-45%, LV global hypokinesis, G2DD, normal RV, dilated aortic root at 21m.   . 4/5 extubated, failed immediately due to mucous plugging identified on bronch after re-intubated. CT ABD with 423mleft ureteral stone with mild edema of left kideny, large areas of consolidation in bilateral lung bases.  . 4/6 broadened to cefepime in light of pneumonia on CT abdomen; urology consulted for distal left ureteral stone, to OR overnight for stent placement, UCx sent.  Versed added for agitation (on fent / precedex) . 4/7 Off vasopressors . 4/11 Vasopressors restarted   Interim History / Subjective:  Sedated, moves with light stimulation.   Cr 2.27, trending down   Objective   Blood pressure 110/66, pulse 62, temperature (!) 100.4 F (38 C), temperature source Oral, resp. rate 20, height _0  (1.88 m), weight (!) 139.9 kg, SpO2 100 %.    Vent Mode: PRVC FiO2 (%):  [40 %] 40 % Set Rate:  [20  bmp] 20 bmp Vt Set:  [660 mL] 660 mL PEEP:  [5 cmH20] 5 cmH20 Pressure Support:  [5 cmH20] 5 cmH20 Plateau Pressure:  [17 cmH20-21 cmH20] 21 cmH20   Intake/Output Summary (Last 24 hours) at 12/23/2020 0703 Last data filed at 12/23/2020 0700 Gross per 24 hour  Intake 7249.94 ml  Output 9700 ml  Net -2450.06 ml    Filed Weights   12/19/20 0500 12/20/20 0500 12/23/20 0500  Weight: (!) 145.9 kg (!) 146.8 kg (!) 139.9 kg    Examination:  General: Chronically and critically ill appearing obese middle aged M, reclined in bed intubated sedated HEENT: NCAT pink mm ETT with some thick tan secretions Neuro: NCAT. LUE flaccid. Movement extremities with light stimuli PULM: Symmetrical chest expansion, even and unlabored on PSV, diminished bibasilar sounds GI: Obese soft ndnt hyperactive bowel sounds  Extremities: No acute joint deformity, no cyanosis or clubbing.  Skin: c/d/i   Imaging/Labs  4/11 renal USKoreaminimal left hydronephrosis  4/05 Ct abdomen: 44m20mistal ureteral stone with mild edema left kidney  4/01 renal US:[place]ormal 4/12 MRI> 9mm6mbacute/chronic ischemic infarct of R frontal centrum semi ovale. 4/12 echo: limited bubble study, nondiagnostic  4/12 carotid US: [place] mild 1-39% stenosis carotid, unable to evaluate left   Resolved Hospital Problem list   Sepsis Associated DIC Traumatic Urethral Bleeding  Assessment & Plan:   Septic shock due to E.Coli UTI, E. Coli bacteremia, Pyelonephritis Recurrent Fevers S/p ureteral stent placement 4/6. 100.3 overnight. BAL consistent w/normal flora. Continuing to spike fevers.   P -  repeat CT abd/pelvis - repeat blood cultures  -continue rocephin  - MAP >75 -needs CVC due to difficult IV access  Acute hypoxic and hypercarbic respiratory failure  P In setting of sepsis/bacteremia with inability to compensate for increased metabolic demands, increased secretions with poor clearance.  Dense bilateral infiltrates noted on CT abdomen  R>L.  Failed extubation 4/5, reintubated with increased secretions / inability to clear. BAL negative from 4/5. Repeat BAL showing few staph,G+ and G- rods. P -MV support, continue weaning efforts -pulm hygiene, VAP -daily SAT and SBT - switch fentanyl gtt to dilaudid, increase oxycodone   -LTVV, 4-8cc/kg IBW with goal Pplat <30 and DP<15 -secretions and agitation are extubation barrier -hypertonic nebs  Acute renal failure In setting of sepsis, ATN, pyelonephritis. Improving with diuresis.  Hypernatremia - improving, 146 L Uretal stone w/o hydronephrosis s/p ureteral stent placement by Urology 4/6. Repeat US w/mild L hydronephrosis.  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   Best practice (right click and "Reselect all SmartList Selections" daily)  Diet:  Tube Feed  Pain/Anxiety/Delirium protocol (if indicated): Yes (RASS goal -1) VAP protocol (if indicated): Yes DVT prophylaxis: Subcutaneous Heparin and SCD GI prophylaxis: PPI Glucose control:  See above Central venous access:  Yes, and it is still needed Arterial line:  N/A Foley:  Yes, and it is still needed   Mobility:  bed rest  PT consulted: N/A Last date of multidisciplinary goals of care discussion: wife updated 4/13 Code Status:  full  code Disposition: ICU  Seawell, Jaimie A, DO 12/23/2020, 7:03 AM Pager: [phone]     PCCM:   60 yo M, e coli bacteremia, obstructed stone, s/p stent. Intubated on life support. Kidney function improving. He is still having fevers   BP 110/66   Pulse 62   Temp 99 F (37.2 C) (Oral)   Resp 20   Ht _0  (1.88 m)   Wt (!) 139.9 kg   SpO2 98%   BMI 39.60 kg/m   Gen: obese elderly male, resting in bed  HENT: ETT in place  Heart: RRR, s1 s2 Lungs: CTAB Abd: obese soft  Labs: reviewed   A:  Septic shock, e coli bacteremia, pyelonephritis, s/p stent  - now with persistent fevers - CVC has been in 13 days  AHRF on MV AKI, Acute renal failure, improving  Subacute CVA  NASH cirrhosis   P: Remove CVC Obtain PIVs Remains on abx at this time  Ct abd pelvis to look for source? Perinephric abscess r/o  Asa + Statin  Continue diuresis, follow UOP  If unable to get PIVs then we need to place PICC  But would prefer line holiday  This patient is critically ill with multiple organ system failure; which, requires frequent high  complexity decision making, assessment, support, evaluation, and titration of therapies. This was completed through the application of advanced monitoring technologies and extensive interpretation of multiple databases. During this encounter critical care time was devoted to patient care services described in this note for 40 minutes.  Garner Nash, DO [facility] 12/23/2020 2:23 PM

## 2020-12-23 NOTE — Progress Notes (Signed)
Pt taken to CT with RN, and transport. Pt is back in the room, on the vent. No adverse events during transport.

## 2020-12-23 NOTE — Progress Notes (Signed)
Spoke with Laverna Peace, pharmacist who said to transition patient slowly from fentanyl to dilaudid. Dr. Tonia Brooms approved the gradual transition as well.  Spoke with Dr. Tonia Brooms to confirm that we will remove CVC once PIVs are established. Spoke with IV team who will contact Dr. Tonia Brooms to confirm CVC removal.

## 2020-12-24 ENCOUNTER — Inpatient Hospital Stay (HOSPITAL_COMMUNITY): Payer: Medicaid Other

## 2020-12-24 LAB — CBC WITH DIFFERENTIAL/PLATELET
Abs Immature Granulocytes: 0.03 10*3/uL (ref 0.00–0.07)
Basophils Absolute: 0 10*3/uL (ref 0.0–0.1)
Basophils Relative: 0 %
Eosinophils Absolute: 0.1 10*3/uL (ref 0.0–0.5)
Eosinophils Relative: 1 %
HCT: 29.3 % — ABNORMAL LOW (ref 39.0–52.0)
Hemoglobin: 9.2 g/dL — ABNORMAL LOW (ref 13.0–17.0)
Immature Granulocytes: 0 %
Lymphocytes Relative: 16 %
Lymphs Abs: 1.3 10*3/uL (ref 0.7–4.0)
MCH: 32.3 pg (ref 26.0–34.0)
MCHC: 31.4 g/dL (ref 30.0–36.0)
MCV: 102.8 fL — ABNORMAL HIGH (ref 80.0–100.0)
Monocytes Absolute: 0.5 10*3/uL (ref 0.1–1.0)
Monocytes Relative: 6 %
Neutro Abs: 6.1 10*3/uL (ref 1.7–7.7)
Neutrophils Relative %: 77 %
Platelets: 257 10*3/uL (ref 150–400)
RBC: 2.85 MIL/uL — ABNORMAL LOW (ref 4.22–5.81)
RDW: 14.1 % (ref 11.5–15.5)
WBC: 8.1 10*3/uL (ref 4.0–10.5)
nRBC: 0 % (ref 0.0–0.2)

## 2020-12-24 LAB — COMPREHENSIVE METABOLIC PANEL
ALT: 16 U/L (ref 0–44)
AST: 36 U/L (ref 15–41)
Albumin: 2 g/dL — ABNORMAL LOW (ref 3.5–5.0)
Alkaline Phosphatase: 65 U/L (ref 38–126)
Anion gap: 9 (ref 5–15)
BUN: 113 mg/dL — ABNORMAL HIGH (ref 6–20)
CO2: 28 mmol/L (ref 22–32)
Calcium: 8.4 mg/dL — ABNORMAL LOW (ref 8.9–10.3)
Chloride: 106 mmol/L (ref 98–111)
Creatinine, Ser: 2.05 mg/dL — ABNORMAL HIGH (ref 0.61–1.24)
GFR, Estimated: 37 mL/min — ABNORMAL LOW (ref >60)
Glucose, Bld: 148 mg/dL — ABNORMAL HIGH (ref 70–99)
Potassium: 3.7 mmol/L (ref 3.5–5.1)
Sodium: 143 mmol/L (ref 135–145)
Total Bilirubin: 0.6 mg/dL (ref 0.3–1.2)
Total Protein: 6.6 g/dL (ref 6.5–8.1)

## 2020-12-24 LAB — GLUCOSE, CAPILLARY
Glucose-Capillary: 149 mg/dL — ABNORMAL HIGH (ref 70–99)
Glucose-Capillary: 141 mg/dL — ABNORMAL HIGH (ref 70–99)
Glucose-Capillary: 150 mg/dL — ABNORMAL HIGH (ref 70–99)
Glucose-Capillary: 175 mg/dL — ABNORMAL HIGH (ref 70–99)
Glucose-Capillary: 181 mg/dL — ABNORMAL HIGH (ref 70–99)
Glucose-Capillary: 245 mg/dL — ABNORMAL HIGH (ref 70–99)

## 2020-12-24 LAB — CULTURE, RESPIRATORY W GRAM STAIN

## 2020-12-24 LAB — MAGNESIUM: Magnesium: 2.2 mg/dL (ref 1.7–2.4)

## 2020-12-24 MED ORDER — COLLAGENASE 250 UNIT/GM EX OINT
TOPICAL_OINTMENT | Freq: Every day | CUTANEOUS | Status: DC
  Administered 2020-12-28 – 2020-12-30 (×3): 1 via TOPICAL
  Filled 2020-12-24 (×4): qty 30

## 2020-12-24 MED ORDER — QUETIAPINE FUMARATE 50 MG PO TABS
50.0000 mg | ORAL_TABLET | Freq: Two times a day (BID) | ORAL | Status: DC
  Administered 2020-12-24 – 2020-12-27 (×6): 50 mg
  Filled 2020-12-24 (×7): qty 1

## 2020-12-24 MED ORDER — SODIUM CHLORIDE 0.9 % IV SOLN
2.0000 g | INTRAVENOUS | Status: DC
  Administered 2020-12-24 – 2020-12-25 (×2): 2 g via INTRAVENOUS
  Filled 2020-12-24 (×2): qty 20

## 2020-12-24 MED ORDER — VANCOMYCIN HCL 1500 MG/300ML IV SOLN
1500.0000 mg | INTRAVENOUS | Status: DC
  Administered 2020-12-25 – 2020-12-28 (×4): 1500 mg via INTRAVENOUS
  Filled 2020-12-24 (×4): qty 300

## 2020-12-24 MED ORDER — POTASSIUM CHLORIDE 20 MEQ PO PACK
40.0000 meq | PACK | Freq: Once | ORAL | Status: AC
  Administered 2020-12-24: 40 meq via ORAL
  Filled 2020-12-24: qty 2

## 2020-12-24 MED ORDER — FUROSEMIDE 10 MG/ML IJ SOLN
80.0000 mg | Freq: Two times a day (BID) | INTRAMUSCULAR | Status: DC
  Administered 2020-12-24 – 2020-12-26 (×5): 80 mg via INTRAVENOUS
  Filled 2020-12-24 (×6): qty 8

## 2020-12-24 MED ORDER — FREE WATER
300.0000 mL | Status: DC
  Administered 2020-12-24 – 2020-12-25 (×6): 300 mL

## 2020-12-24 MED ORDER — VANCOMYCIN HCL 10 G IV SOLR
2500.0000 mg | Freq: Once | INTRAVENOUS | Status: AC
  Administered 2020-12-24: 2500 mg via INTRAVENOUS
  Filled 2020-12-24: qty 2500

## 2020-12-24 NOTE — Progress Notes (Signed)
PCCM:  Notified by nursing staff that blood cultures positive for gram-positive cocci.  Patient has tracheal aspirate from 12/20/2020 + for Staph aureus.  Continue ceftriaxone Consult pharmacy for vancomycin dosing. 2D limited echo   Josephine Igo, DO [facility] 12/24/2020 6:04 PM

## 2020-12-24 NOTE — Progress Notes (Signed)
Lab notified RN that blood cultures that had been previously cancelled day before had grown gram positive cocci indicating staph or staph like organism however results could not be released due to the order being cancelled. RN notified MD on Call and pharmacy was consulted for vancomycin dosing.

## 2020-12-24 NOTE — Plan of Care (Signed)
  Problem: Education: Goal: Knowledge of General Education information will improve Description: Including pain rating scale, medication(s)/side effects and non-pharmacologic comfort measures Outcome: Progressing   Problem: Health Behavior/Discharge Planning: Goal: Ability to manage health-related needs will improve Outcome: Progressing   Problem: Clinical Measurements: Goal: Ability to maintain clinical measurements within normal limits will improve Outcome: Progressing Goal: Will remain free from infection Outcome: Progressing Goal: Diagnostic test results will improve Outcome: Progressing Goal: Respiratory complications will improve Outcome: Progressing Goal: Cardiovascular complication will be avoided Outcome: Progressing   Problem: Activity: Goal: Risk for activity intolerance will decrease Outcome: Progressing   Problem: Nutrition: Goal: Adequate nutrition will be maintained Outcome: Progressing   Problem: Elimination: Goal: Will not experience complications related to bowel motility Outcome: Progressing Goal: Will not experience complications related to urinary retention Outcome: Progressing   Problem: Pain Managment: Goal: General experience of comfort will improve Outcome: Progressing   Problem: Safety: Goal: Ability to remain free from injury will improve Outcome: Progressing   Problem: Safety: Goal: Non-violent Restraint(s) Outcome: Progressing   Problem: Education: Goal: Knowledge of disease or condition will improve Outcome: Progressing Goal: Knowledge of secondary prevention will improve Outcome: Progressing Goal: Knowledge of patient specific risk factors addressed and post discharge goals established will improve Outcome: Progressing   Problem: Self-Care: Goal: Verbalization of feelings and concerns over difficulty with self-care will improve Outcome: Progressing   Problem: Nutrition: Goal: Dietary intake will improve Outcome: Progressing    Problem: Coping: Goal: Level of anxiety will decrease Outcome: Not Progressing   Problem: Skin Integrity: Goal: Risk for impaired skin integrity will decrease Outcome: Not Progressing

## 2020-12-24 NOTE — Progress Notes (Addendum)
Pharmacy Antibiotic Note  [name] is a 60 y.o. male admitted on 12/10/2020 with hypoxic respiratory failure and septic shock secondary to E coli bacteremia/UTI/pyelonephritis. Pt has had multiple antibiotics during hospital course (see below). He has had recurrent fevers; has been off ceftriaxone for past 2 days, restarting today for 5 days. Pt now with bld cx positive for GPC (had trach aspirate from 4/10 positive for Staph aureus). Pharmacy has been consulted for vancomycin dosing.  Pt also with AKI in setting of sepsis, ATN, pyelonephritis, which is continuing to improve with diuresis; Scr today is 2.05, CrCl 58.5 ml/min.  WBC 8.1, Tmax 102.8 F  Plan: Vancomycin 2500 mg IV X 1, followed by vancomycin 1500 mg IV Q 24 hrs (estimated vancomycin AUC on this regimen, using Scr 2.05, is 506.5; goal vancomycin AUC is 400-550); since unable to give vancomycin 20 mg/kg loading dose due to dose cap of 2500 mg, will start maintenance regimen ~10 hrs after loading dose when vancomycin level is estimated to be in 16-17 mg/L range, based on current CrCl and estimated Vd Will ck Scr with AM labs - if significantly improved, will adjust vancomycin dosing as needed Monitor WBC, temp, clinical improvement, cultures, renal function, vancomycin levels  Height: _0  (188 cm) Weight: (!) 139.9 kg (308 lb 6.8 oz) IBW/kg (Calculated) : 82.2  Temp (24hrs), Avg:100.2 F (37.9 C), Min:98 F (36.7 C), Max:102.8 F (39.3 C)  Recent Labs  Lab 12/20/20 0532 12/21/20 0500 12/22/20 0702 12/23/20 0438 12/24/20 0152  WBC 8.0 6.9 7.0 7.9 8.1  CREATININE 3.15* 3.12* 2.62* 2.27* 2.05*    Estimated Creatinine Clearance: 58.5 mL/min (A) (by C-G formula based on SCr of 2.05 mg/dL (H)).    Allergies  Allergen Reactions  . Ibuprofen Other (See Comments)    Blood in the stool.  . Metformin Other (See Comments)    Tremors, muscles  locking up, unable to control extremities    Antimicrobials this  admission: Cefazolin 4/4-4/6 Cefepime 4/6-4/7 Zosyn 3/31 Ceftriaxone 3/31-4/4, 4/7-4/11, 4/14 >> Vancomycin 4/14 >>  Microbiology results: 3/31 BCx X 2:  E coli, pan-sensitive 3/31 UCx: E coli, pan-sensitive 4/6 UCx: NG/final 3/31 COVID, flu A, flu B, HIV screen: negative 4/5 Trach aspirate, BAL: consistent with normal respiratory flora 4/10 Trach aspirate: few Staph aureus, pan-sensitive 4/13 BCx: GPC (per RN, these bld cx were ordered yesterday, but cancelled by provider; cx now growing GPC in bld, but lab cannot report out cx results since order cancelled)  Thank you for allowing pharmacy to be a part of this patient's care.  Gillermina Hu, PharmD, BCPS, [facility] Clinical Pharmacist 12/24/2020 6:14 PM

## 2020-12-24 NOTE — Progress Notes (Addendum)
NAME:  [name], MRN:  [id number], DOB:  17-Nov-1960, LOS: 10 ADMISSION DATE:  12/10/2020,   History of Present Illness:  60 year old male who presented 3/31 to the ER from home with 4-5 day hx of diarrhea and vomiting.  He developed confusion and became belligerent at home. Initial ER evaluation found him to be hypotensive, combative, confused, and was intubated.  He was noted to have mottling over his trunk and extremities.  Admitted for hypoxemic respiratory failure and septic shock 2/2 E. Coli bacteremia and UTI, and ATN.   Pertinent  Medical History  Prior smoker quit 15 years ago 82-100-pack-year history Sinusitis  Chronic pain syndrome Hypertension Hyperlipidemia Chronic back pain  Significant Hospital Events: Including procedures, antibiotic start and stop dates in addition to other pertinent events   . 03/31 patient was intubated . 03/31 triple-lumen catheter inserted . 4/1 off pressors. E coli bacteremia, e coli uti. Some improvement in pulm opacities on CXR. On rocephin. TTE with LVEF 40-45%, LV global hypokinesis, G2DD, normal RV, dilated aortic root at 752m.   . 4/5 extubated, failed immediately due to mucous plugging identified on bronch after re-intubated. CT ABD with 4110mleft ureteral stone with mild edema of left kideny, large areas of consolidation in bilateral lung bases.  . 4/6 broadened to cefepime in light of pneumonia on CT abdomen; urology consulted for distal left ureteral stone, to OR overnight for stent placement, UCx sent.  Versed added for agitation (on fent / precedex) . 4/7 Off vasopressors . 4/11 Vasopressors restarted   Interim History / Subjective:  Sedated, following some commands. No   Objective   Blood pressure (!) 117/58, pulse 89, temperature 98.2 F (36.8 C), temperature source Oral, resp. rate 17, height _0  (1.88 m), weight (!) 139.9 kg, SpO2 95 %.    Vent Mode: PRVC FiO2 (%):  [40 %] 40 % Set Rate:  [20 bmp] 20 bmp Vt Set:  [660 mL]  660 mL PEEP:  [5 cmH20] 5 cmH20 Plateau Pressure:  [20 cmH20-25 cmH20] 20 cmH20   Intake/Output Summary (Last 24 hours) at 12/24/2020 0703 Last data filed at 12/24/2020 0600 Gross per 24 hour  Intake 7090.2 ml  Output 11080 ml  Net -3989.8 ml    Filed Weights   12/19/20 0500 12/20/20 0500 12/23/20 0500  Weight: (!) 145.9 kg (!) 146.8 kg (!) 139.9 kg    Examination:  General: Chronically and critically ill appearing obese middle aged M, reclined in bed intubated, sedated HEENT: NCAT pink mm ETT with some thick tan secretions Neuro: NCAT. LUE flaccid. constant movement legs, moves legs and RUE to command, 2+ patellar reflexes PULM: Symmetrical chest expansion, even and unlabored on PSV, diminished bibasilar sounds, rhonchi on right GI: Obese soft ndnt hyperactive bowel sounds  Extremities: No acute joint deformity, no cyanosis or clubbing.  Skin: sacral stage II pressure ulcer with adjacent unstageable ulceration, no purulent drainage   Imaging/Labs  4/11 renal USKoreaminimal left hydronephrosis  4/05 Ct abdomen: 52m55mistal ureteral stone with mild edema left kidney  4/01 renal US:[place]ormal 4/12 MRI> 9mm752mbacute/chronic ischemic infarct of R frontal centrum semi ovale. 4/12 echo: limited bubble study, nondiagnostic  4/12 carotid US: [place] mild 1-39% stenosis carotid, unable to evaluate left  4/14 CT abd/pelvis: left ureteral stent in place, distal left stone still present, bibasilar consolidation  Resolved Hospital Problem list   Sepsis Associated DIC Traumatic Urethral Bleeding  Assessment & Plan:   Septic shock due to E.Coli UTI, E.  Coli bacteremia, Pyelonephritis Recurrent Fevers, MSSA Pneumonia  S/p ureteral stent placement 4/6. BAL consistent w/normal flora. Afebrile overnight, 101.6 this morning. CVC d/ced yesterday.   P: -off rocephin last two days, restart today for 5 days - MAP >75 -Continue line holiday  Acute hypoxic and hypercarbic respiratory failure   P sepsis/bacteremia with inability to compensate for increased metabolic demands, increased secretions with poor clearance.  Dense bilateral infiltrates noted on CT abdomen R>L.  Failed extubation 4/5, reintubated with increased secretions / inability to clear. BAL 4/5 w/normal floar & 4/11 with rare staph aureus. CXR 4/14 unchanged from prior.  P -MV support, continue weaning efforts -pulm hygiene, VAP -daily SAT and SBT - requiring Dilaudid gtt, propofol, precedex gtt for adequate sedation.  -LTVV, 4-8cc/kg IBW with goal Pplat <30 and DP<15 -secretions, fluid, agitation extubation barrier - now net negative 7L -hypertonic nebs  Acute renal failure In setting of sepsis, ATN, pyelonephritis. Continuing to improve with diuresis.  Hypernatremia - resolved L Uretal stone w/o hydronephrosis s/p ureteral stent placement by Urology 4/6. Repeat US w/mild L hydronephrosis.  P -Nephro following -MAP goal > 65  -cont lasix 80 tid - decreased to 300 cc/hr q4h -OP uro f/u - cont. Coude cath  Agitation Per wife patient had developed spasms and chorea like symptoms for the last several months. He had an initial appointment with neurology in a few months. Some of his agitation may be involuntary movement.  - cont. 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  P -ASA/statin - f/u neuro after d/c  - PT/OT/SLP once extubated  NASH cirrhosis  LFTs up a bit suspect congestive, INR okay.  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 WOC following.   Best practice (right click and "Reselect all SmartList Selections" daily)  Diet:  Tube Feed  Pain/Anxiety/Delirium protocol (if indicated): Yes (RASS goal -1) VAP protocol (if indicated): Yes DVT prophylaxis: Subcutaneous Heparin and SCD GI prophylaxis: PPI Glucose control:  See above Central venous access:  N/A  Arterial line:  N/A Foley:  Yes, and it is still needed   Mobility:  bed rest  PT consulted: N/A Last date of multidisciplinary goals of care discussion: wife updated 4/13 Code Status:  full code Disposition: ICU  Seawell, Jaimie A, DO 12/24/2020, 7:03 AM Pager: [phone]     Pulmonary critical care attending:  This is a 60 year old gentleman admitted for E. coli bacteremia, obstructed ureteral stone status post stent placement.  Also has significant smoking history history of hypertension hyperlipidemia and chronic pain.  Ongoing fevers, MSSA pneumonia, also with CV see in place for 13 days, removed yesterday.  BP (!) 96/54   Pulse 74   Temp (!) 101.9 F (38.8 C) (Axillary)   Resp 20   Ht _0  (1.88 m)   Wt (!) 139.9 kg   SpO2 96%  BMI 39.60 kg/m   General: Middle-aged male intubated on mechanical life support critically ill HEENT: Endotracheal tube in place Heart: Regular rhythm S1-S2 Lungs: Bilateral mechanically ventilated breath sounds Abdomen: Obese soft nontender nondistended  Labs: Reviewed serum creatinine improving  Assessment: Septic shock secondary to E. coli UTI, E. coli bacteremia pyelonephritis obstructed ureteral stone, improving now after being in the ICU for several days has recurrent fevers, MSSA pneumonia, CBC was in place for 13 days status post removal on fourth 13 2022 Acute hypoxemic respiratory failure requiring intubation mechanical  ventilation Acute renal failure Hypernatremia, improved  Plan: Continue antimicrobials, on Rocephin Discussed with pharmacy on rounds.. Kidney function is improving, continue to observe Switch sedation by increasing orals yesterday On oxycodone plus Klonopin plus Seroquel Titrating down continuous IV sedation. SBT/SAT in a.m. Consideration for liberation from mechanical support tomorrow.  This patient is critically ill with multiple organ system failure; which, requires frequent high complexity decision making, assessment, support, evaluation, and titration of therapies. This was completed through the application of advanced monitoring technologies and extensive interpretation of multiple databases. During this encounter critical care time was devoted to patient care services described in this note for 34 minutes.   [facility] 12/24/2020 1:36 PM

## 2020-12-24 NOTE — Progress Notes (Signed)
Central line is okay to use  No pneumothorax on CXR

## 2020-12-24 NOTE — Consult Note (Signed)
WOC Nurse Consult Note: Patient receiving care in [facility] 2M10. Reason for Consult: worsening right buttock wound Wound type: unstageable Pressure Injury POA: Yes/No/NA Measurement: Wound bed: 90% black Drainage (amount, consistency, odor)  Periwound: Dressing procedure/placement/frequency: Apply Santyl to right buttock in a nickel thick layer. Cover with a saline moistened gauze, then dry gauze or ABD pad.  Change daily. Primary RN to do on days PT not performing hydrotherapy. I have requested PT for hydrotherapy for 6 days/week. Helmut Muster, RN, MSN, CWOCN, CNS-BC, pager [phone]

## 2020-12-24 NOTE — Procedures (Signed)
Central Venous Catheter Insertion Procedure Note  [name]  [id number]  November 04, 1960  Date:12/24/20  Time:7:07 PM   Provider Performing:[name] [name]   Procedure: Insertion of Non-tunneled Central Venous [phone]) with US guidance (00511)   Indication(s) Medication administration  Consent Risks of the procedure as well as the alternatives and risks of each were explained to the patient and/or caregiver.  Consent for the procedure was obtained and is signed in the bedside chart  Anesthesia Topical only with 1% lidocaine   Timeout Verified patient identification, verified procedure, site/side was marked, verified correct patient position, special equipment/implants available, medications/allergies/relevant history reviewed, required imaging and test results available.  Sterile Technique Maximal sterile technique including full sterile barrier drape, hand hygiene, sterile gown, sterile gloves, mask, hair covering, sterile ultrasound probe cover (if used).  Procedure Description Area of catheter insertion was cleaned with chlorhexidine and draped in sterile fashion.  With real-time ultrasound guidance a central venous catheter was placed into the right internal jugular vein. Nonpulsatile blood flow and easy flushing noted in all ports.  The catheter was sutured in place and sterile dressing applied.  Complications/Tolerance None; patient tolerated the procedure well. Chest X-ray is ordered to verify placement for internal jugular or subclavian cannulation.   Chest x-ray is not ordered for femoral cannulation.  EBL Minimal  Specimen(s) None   [name], AGACNP-BC [facility]  See Amion for personal pager PCCM on call pager [phone] until 7pm. Please call Elink 7p-7a. [phone]  12/24/2020 7:08 PM

## 2020-12-25 ENCOUNTER — Inpatient Hospital Stay (HOSPITAL_COMMUNITY): Payer: Medicaid Other

## 2020-12-25 DIAGNOSIS — R7881 Bacteremia: Secondary | ICD-10-CM

## 2020-12-25 DIAGNOSIS — I6330 Cerebral infarction due to thrombosis of unspecified cerebral artery: Secondary | ICD-10-CM

## 2020-12-25 DIAGNOSIS — I82C12 Acute embolism and thrombosis of left internal jugular vein: Secondary | ICD-10-CM

## 2020-12-25 DIAGNOSIS — I82409 Acute embolism and thrombosis of unspecified deep veins of unspecified lower extremity: Secondary | ICD-10-CM

## 2020-12-25 DIAGNOSIS — I639 Cerebral infarction, unspecified: Secondary | ICD-10-CM

## 2020-12-25 DIAGNOSIS — B957 Other staphylococcus as the cause of diseases classified elsewhere: Secondary | ICD-10-CM

## 2020-12-25 DIAGNOSIS — T80219A Unspecified infection due to central venous catheter, initial encounter: Secondary | ICD-10-CM

## 2020-12-25 LAB — BLOOD GAS, ARTERIAL
Acid-Base Excess: 2.6 mmol/L — ABNORMAL HIGH (ref 0.0–2.0)
Bicarbonate: 27.1 mmol/L (ref 20.0–28.0)
FIO2: 50
O2 Saturation: 98.4 %
Patient temperature: 37.6
pCO2 arterial: 47.1 mmHg (ref 32.0–48.0)
pH, Arterial: 7.382 (ref 7.350–7.450)
pO2, Arterial: 113 mmHg — ABNORMAL HIGH (ref 83.0–108.0)

## 2020-12-25 LAB — ECHOCARDIOGRAM LIMITED
Height: 74 in
Weight: 4934.78 [oz_av]

## 2020-12-25 LAB — BLOOD CULTURE ID PANEL (REFLEXED) - BCID2

## 2020-12-25 LAB — CBC WITH DIFFERENTIAL/PLATELET
Abs Immature Granulocytes: 0.04 10*3/uL (ref 0.00–0.07)
Basophils Absolute: 0 10*3/uL (ref 0.0–0.1)
Basophils Relative: 1 %
Eosinophils Absolute: 0.1 10*3/uL (ref 0.0–0.5)
Eosinophils Relative: 1 %
HCT: 28.4 % — ABNORMAL LOW (ref 39.0–52.0)
Hemoglobin: 8.9 g/dL — ABNORMAL LOW (ref 13.0–17.0)
Immature Granulocytes: 1 %
Lymphocytes Relative: 11 %
Lymphs Abs: 0.9 10*3/uL (ref 0.7–4.0)
MCH: 31.8 pg (ref 26.0–34.0)
MCHC: 31.3 g/dL (ref 30.0–36.0)
MCV: 101.4 fL — ABNORMAL HIGH (ref 80.0–100.0)
Monocytes Absolute: 0.4 10*3/uL (ref 0.1–1.0)
Monocytes Relative: 5 %
Neutro Abs: 6.6 10*3/uL (ref 1.7–7.7)
Neutrophils Relative %: 81 %
Platelets: 246 10*3/uL (ref 150–400)
RBC: 2.8 MIL/uL — ABNORMAL LOW (ref 4.22–5.81)
RDW: 14 % (ref 11.5–15.5)
WBC: 8 10*3/uL (ref 4.0–10.5)
nRBC: 0 % (ref 0.0–0.2)

## 2020-12-25 LAB — COMPREHENSIVE METABOLIC PANEL
ALT: 15 U/L (ref 0–44)
AST: 28 U/L (ref 15–41)
Albumin: 2 g/dL — ABNORMAL LOW (ref 3.5–5.0)
Alkaline Phosphatase: 59 U/L (ref 38–126)
Anion gap: 6 (ref 5–15)
BUN: 101 mg/dL — ABNORMAL HIGH (ref 6–20)
CO2: 29 mmol/L (ref 22–32)
Calcium: 8.3 mg/dL — ABNORMAL LOW (ref 8.9–10.3)
Chloride: 105 mmol/L (ref 98–111)
Creatinine, Ser: 1.95 mg/dL — ABNORMAL HIGH (ref 0.61–1.24)
GFR, Estimated: 39 mL/min — ABNORMAL LOW (ref >60)
Glucose, Bld: 240 mg/dL — ABNORMAL HIGH (ref 70–99)
Potassium: 3.7 mmol/L (ref 3.5–5.1)
Sodium: 140 mmol/L (ref 135–145)
Total Bilirubin: 0.7 mg/dL (ref 0.3–1.2)
Total Protein: 6.9 g/dL (ref 6.5–8.1)

## 2020-12-25 LAB — GLUCOSE, CAPILLARY
Glucose-Capillary: 247 mg/dL — ABNORMAL HIGH (ref 70–99)
Glucose-Capillary: 187 mg/dL — ABNORMAL HIGH (ref 70–99)
Glucose-Capillary: 234 mg/dL — ABNORMAL HIGH (ref 70–99)
Glucose-Capillary: 245 mg/dL — ABNORMAL HIGH (ref 70–99)
Glucose-Capillary: 205 mg/dL — ABNORMAL HIGH (ref 70–99)
Glucose-Capillary: 183 mg/dL — ABNORMAL HIGH (ref 70–99)

## 2020-12-25 LAB — HEPARIN LEVEL (UNFRACTIONATED): Heparin Unfractionated: 0.15 IU/mL — ABNORMAL LOW (ref 0.30–0.70)

## 2020-12-25 LAB — MAGNESIUM: Magnesium: 2.5 mg/dL — ABNORMAL HIGH (ref 1.7–2.4)

## 2020-12-25 MED ORDER — INSULIN ASPART 100 UNIT/ML ~~LOC~~ SOLN: 4.0000 [IU] | SUBCUTANEOUS | Status: DC

## 2020-12-25 MED ORDER — HEPARIN BOLUS VIA INFUSION
6500.0000 [IU] | Freq: Once | INTRAVENOUS | Status: AC
  Administered 2020-12-25: 6500 [IU] via INTRAVENOUS
  Filled 2020-12-25: qty 6500

## 2020-12-25 MED ORDER — INSULIN ASPART 100 UNIT/ML ~~LOC~~ SOLN
0.0000 [IU] | SUBCUTANEOUS | Status: DC
  Administered 2020-12-25 (×2): 5 [IU] via SUBCUTANEOUS
  Administered 2020-12-25: 3 [IU] via SUBCUTANEOUS
  Administered 2020-12-26 (×2): 2 [IU] via SUBCUTANEOUS
  Administered 2020-12-26 (×2): 3 [IU] via SUBCUTANEOUS
  Administered 2020-12-26 – 2020-12-27 (×2): 2 [IU] via SUBCUTANEOUS
  Administered 2020-12-27: 5 [IU] via SUBCUTANEOUS
  Administered 2020-12-27: 2 [IU] via SUBCUTANEOUS
  Administered 2020-12-27 (×2): 5 [IU] via SUBCUTANEOUS
  Administered 2020-12-27: 3 [IU] via SUBCUTANEOUS
  Administered 2020-12-28 (×2): 5 [IU] via SUBCUTANEOUS
  Administered 2020-12-28: 3 [IU] via SUBCUTANEOUS
  Administered 2020-12-28: 5 [IU] via SUBCUTANEOUS
  Administered 2020-12-28: 2 [IU] via SUBCUTANEOUS
  Administered 2020-12-29 (×3): 3 [IU] via SUBCUTANEOUS

## 2020-12-25 MED ORDER — FREE WATER
200.0000 mL | Status: DC
  Administered 2020-12-25 – 2020-12-26 (×7): 200 mL

## 2020-12-25 MED ORDER — INSULIN ASPART 100 UNIT/ML ~~LOC~~ SOLN: 0.0000 [IU] | SUBCUTANEOUS | Status: DC

## 2020-12-25 MED ORDER — INSULIN ASPART 100 UNIT/ML ~~LOC~~ SOLN
6.0000 [IU] | SUBCUTANEOUS | Status: DC
  Administered 2020-12-25 – 2020-12-29 (×18): 6 [IU] via SUBCUTANEOUS

## 2020-12-25 MED ORDER — HEPARIN BOLUS VIA INFUSION
3000.0000 [IU] | Freq: Once | INTRAVENOUS | Status: AC
  Administered 2020-12-25: 3000 [IU] via INTRAVENOUS
  Filled 2020-12-25: qty 3000

## 2020-12-25 MED ORDER — INSULIN DETEMIR 100 UNIT/ML ~~LOC~~ SOLN
25.0000 [IU] | Freq: Two times a day (BID) | SUBCUTANEOUS | Status: DC
  Administered 2020-12-25 – 2020-12-28 (×5): 25 [IU] via SUBCUTANEOUS
  Filled 2020-12-25 (×8): qty 0.25

## 2020-12-25 MED ORDER — HEPARIN (PORCINE) 25000 UT/250ML-% IV SOLN
2150.0000 [IU]/h | INTRAVENOUS | Status: DC
  Administered 2020-12-25: 1800 [IU]/h via INTRAVENOUS
  Administered 2020-12-25: 2150 [IU]/h via INTRAVENOUS
  Filled 2020-12-25 (×3): qty 250

## 2020-12-25 NOTE — Progress Notes (Signed)
ANTICOAGULATION CONSULT NOTE - Follow Up Consult  Pharmacy Consult for Heparin Indication: Left IJ and subclavian non-occlusive clots  Allergies  Allergen Reactions  . Ibuprofen Other (See Comments)    Blood in the stool.  . Metformin Other (See Comments)    Tremors, muscles  locking up, unable to control extremities    Patient Measurements: Height: 6\' 2"  (188 cm) Weight: (!) 139.9 kg (308 lb 6.8 oz) IBW/kg (Calculated) : 82.2  Vital Signs: Temp: 99.9 F (37.7 C) (04/15 1924) Temp Source: Oral (04/15 1924) BP: 121/58 (04/15 2000) Pulse Rate: 72 (04/15 2000)  Labs: Recent Labs    12/23/20 0438 12/24/20 0152 12/25/20 0358 12/25/20 1934  HGB 9.1* 9.2* 8.9*  --   HCT 28.9* 29.3* 28.4*  --   PLT 261 257 246  --   HEPARINUNFRC  --   --   --  0.15*  CREATININE 2.27* 2.05* 1.95*  --     Estimated Creatinine Clearance: 61.5 mL/min (A) (by C-G formula based on SCr of 1.95 mg/dL (H)).  Assessment: 60 year old male admitted for hypoxemic respiratory failure and septic shock 2/2 E. Coli bacteremia and UTI, and ATN. Now with Left IJ and subclavian non-occlusive clots.  Pharmacy consulted to start heparin drip. Heparin dosing weight of 113 kg.  Last heparin subq dose a 0540 this am.  Post start heparin level at 6.5 hours is 0.15. Will rebolus and increase drip.   Goal of Therapy:  Heparin level 0.3-0.7 units/ml Monitor platelets by anticoagulation protocol: Yes   Plan:  Heparin 3000 units IV x 1 Increase heparin to 2150 units/hr Heparin level in 6 hours  Normon Pettijohn A. [id number], PharmD, BCPS, [facility] Clinical Pharmacist [place] Please utilize Amion for appropriate phone number to reach the unit pharmacist [facility])   12/25/2020, 8:32 PM

## 2020-12-25 NOTE — Progress Notes (Signed)
PHARMACY - PHYSICIAN COMMUNICATION CRITICAL VALUE ALERT - BLOOD CULTURE IDENTIFICATION (BCID)  [name] is an 60 y.o. male who presented to [facility] on 12/10/2020 with a chief complaint of sepsis 2/2 E. Coli bacteremia which was treated appropriately. Now with new fevers, TA with MSSA, and newly positive blood cultures for Staph epi in 2/2 bottles (aerobic bottle only drawn in each set).  Assessment:  Bcx 2/2 bottles growing S. epi  Name of physician (or Provider) Contacted: Jeanella Cara, PharmD; Sharin Mons, PharmD  Current antibiotics: vancomycin and ceftriaxone  Changes to prescribed antibiotics recommended:  Continue antibiotics at this time  Results for orders placed or performed during the hospital encounter of 12/10/20  Blood Culture ID Panel (Reflexed) (Collected: 12/25/2020  8:12 AM)  Result Value Ref Range   Enterococcus faecalis NOT DETECTED NOT DETECTED   Enterococcus Faecium NOT DETECTED NOT DETECTED   Listeria monocytogenes NOT DETECTED NOT DETECTED   Staphylococcus species DETECTED (A) NOT DETECTED   Staphylococcus aureus (BCID) NOT DETECTED NOT DETECTED   Staphylococcus epidermidis DETECTED (A) NOT DETECTED   Staphylococcus lugdunensis NOT DETECTED NOT DETECTED   Streptococcus species NOT DETECTED NOT DETECTED   Streptococcus agalactiae NOT DETECTED NOT DETECTED   Streptococcus pneumoniae NOT DETECTED NOT DETECTED   Streptococcus pyogenes NOT DETECTED NOT DETECTED   A.calcoaceticus-baumannii NOT DETECTED NOT DETECTED   Bacteroides fragilis NOT DETECTED NOT DETECTED   Enterobacterales NOT DETECTED NOT DETECTED   Enterobacter cloacae complex NOT DETECTED NOT DETECTED   Escherichia coli NOT DETECTED NOT DETECTED   Klebsiella aerogenes NOT DETECTED NOT DETECTED   Klebsiella oxytoca NOT DETECTED NOT DETECTED   Klebsiella pneumoniae NOT DETECTED NOT DETECTED   Proteus species NOT DETECTED NOT DETECTED   Salmonella species NOT DETECTED NOT DETECTED   Serratia  marcescens NOT DETECTED NOT DETECTED   Haemophilus influenzae NOT DETECTED NOT DETECTED   Neisseria meningitidis NOT DETECTED NOT DETECTED   Pseudomonas aeruginosa NOT DETECTED NOT DETECTED   Stenotrophomonas maltophilia NOT DETECTED NOT DETECTED   Candida albicans NOT DETECTED NOT DETECTED   Candida auris NOT DETECTED NOT DETECTED   Candida glabrata NOT DETECTED NOT DETECTED   Candida krusei NOT DETECTED NOT DETECTED   Candida parapsilosis NOT DETECTED NOT DETECTED   Candida tropicalis NOT DETECTED NOT DETECTED   Cryptococcus neoformans/gattii NOT DETECTED NOT DETECTED   Methicillin resistance mecA/C DETECTED (A) NOT DETECTED    [name] 12/25/2020  1:05 PM

## 2020-12-25 NOTE — Progress Notes (Signed)
Left carotid artery duplex and left upper extremity venous duplex completed. Refer to "CV Proc" under chart review to view preliminary results.  Preliminary results discussed with Dr. Cleaster Corin.  12/25/2020 12:18 PM Eula Fried., MHA, RVT, RDCS, RDMS

## 2020-12-25 NOTE — Progress Notes (Signed)
  Echocardiogram 2D Echocardiogram has been performed.  [name] 12/25/2020, 8:30 AM

## 2020-12-25 NOTE — Progress Notes (Signed)
Physical Therapy Wound Evaluation and Treatment Patient Details  Name: [name] MRN: [id number] Date of Birth: [birth year]  Today's Date: 12/25/2020 Time: 6381-7711 Time Calculation (min): 44 min  Subjective  Subjective Assessment Subjective: Pt intermittently alert on the vent. Nodding head when PT introduced herself and explained roll of hydrotherapy Patient and Family Stated Goals: None stated Date of Onset:  (Unknown) Prior Treatments:  (Dressing changes)  Pain Score:  Pt with no obvious indication of pain throughout session. On vent with intermittent periods of being alert.  Wound Assessment  Pressure Injury 12/21/20 Right Deep Tissue Pressure Injury - Purple or maroon localized area of discolored intact skin or blood-filled blister due to damage of underlying soft tissue from pressure and/or shear. purple with blister area that has torn (Active)  Wound Image   12/25/20 1100  Dressing Type ABD;Barrier Film (skin prep);Gauze (Comment);Moist to moist;Santyl 12/25/20 1100  Dressing Changed;Clean;Dry;Intact 12/25/20 1100  Dressing Change Frequency Daily 12/25/20 1100  State of Healing Eschar 12/25/20 1100  Site / Wound Assessment Black;Red 12/25/20 1100  % Wound base Red or Granulating 25% 12/25/20 1100  % Wound base Yellow/Fibrinous Exudate 5% 12/25/20 1100  % Wound base Black/Eschar 70% 12/25/20 1100  % Wound base Other/Granulation Tissue (Comment) 0% 12/25/20 1100  Peri-wound Assessment Intact;Bleeding;Maceration;Erythema (blanchable) 12/25/20 1100  Wound Length (cm) 10.3 cm 12/25/20 1100  Wound Width (cm) 5 cm 12/25/20 1100  Wound Depth (cm) 0.1 cm 12/25/20 1100  Wound Surface Area (cm^2) 51.5 cm^2 12/25/20 1100  Wound Volume (cm^3) 5.15 cm^3 12/25/20 1100  Tunneling (cm) 0 12/25/20 1100  Undermining (cm) 0 12/25/20 1100  Margins Unattached edges (unapproximated) 12/25/20 1100  Drainage Amount Minimal 12/25/20 1100  Drainage Description Serosanguineous 12/25/20 1100   Treatment Debridement (Selective);Hydrotherapy (Pulse lavage);Packing (Saline gauze) 12/25/20 1100      Hydrotherapy Pulsed lavage therapy - wound location: R buttock Pulsed Lavage with Suction (psi): 12 psi Pulsed Lavage with Suction - Normal Saline Used: 1000 mL Pulsed Lavage Tip: Tip with splash shield Selective Debridement Selective Debridement - Location: R buttock Selective Debridement - Tools Used: Scalpel Selective Debridement - Tissue Removed: Debridement limited to scoring eschar due to urgent need to roll pt back to supine 2 hypotension    Wound Assessment and Plan  Wound Therapy - Assess/Plan/Recommendations Wound Therapy - Clinical Statement: Pt presents to hydrotherapy with an unstagable R buttock pressure injury. Treatment limited as pt with hypotension and unable to get an accurate BP reading. RN present throughout session. Eschar scored and Santyl applied to wound. Will attempt further debridement next session. This patient will benefit from continued hydrotherapy for selective removal of unviable tissue, to decrease bioburden, and promote wound bed healing. Wound Therapy - Functional Problem List: Global weakness in the setting of critical illness and extended ICU stay. Factors Delaying/Impairing Wound Healing: Immobility,Multiple medical problems,Polypharmacy,Infection - systemic/local Hydrotherapy Plan: Debridement,Dressing change,Patient/family education,Pulsatile lavage with suction Wound Therapy - Frequency: 6X / week Wound Therapy - Follow Up Recommendations: dressing changes by RN  Wound Therapy Goals- Improve the function of patient's integumentary system by progressing the wound(s) through the phases of wound healing (inflammation - proliferation - remodeling) by: Wound Therapy Goals - Improve the function of patient's integumentary system by progressing the wound(s) through the phases of wound healing by: Decrease Necrotic Tissue to: 25% Decrease Necrotic  Tissue - Progress: Goal set today Increase Granulation Tissue to: 75% Increase Granulation Tissue - Progress: Goal set today Improve Drainage Characteristics: Min,Serous Improve Drainage Characteristics - Progress: Goal set  today Goals/treatment plan/discharge plan were made with and agreed upon by patient/family: No, Patient unable to participate in goals/treatment/discharge plan and family unavailable Time For Goal Achievement: 7 days Wound Therapy - Potential for Goals: Good  Goals will be updated until maximal potential achieved or discharge criteria met.  Discharge criteria: when goals achieved, discharge from hospital, MD decision/surgical intervention, no progress towards goals, refusal/missing three consecutive treatments without notification or medical reason.  GP     Charges PT Wound Care Charges $Wound Debridement up to 20 cm: < or equal to 20 cm $ Wound Debridement each add'l 20 sqcm: 1 $PT PLS Gun and Tip: 1 Supply $PT Hydrotherapy Visit: 1 Visit       [name] 12/25/2020, 2:06 PM   Rolinda Roan, PT, DPT Acute Rehabilitation Services Pager: [phone] Office: [phone]

## 2020-12-25 NOTE — Progress Notes (Signed)
ANTICOAGULATION CONSULT NOTE - Follow Up Consult  Pharmacy Consult for Heparin Indication: Left IJ and subclavian non-occlusive clots  Allergies  Allergen Reactions  . Ibuprofen Other (See Comments)    Blood in the stool.  . Metformin Other (See Comments)    Tremors, muscles  locking up, unable to control extremities    Patient Measurements: Height: 6\' 2"  (188 cm) Weight: (!) 139.9 kg (308 lb 6.8 oz) IBW/kg (Calculated) : 82.2  Vital Signs: Temp: 98.5 F (36.9 C) (04/15 1117) Temp Source: Oral (04/15 1117) BP: 111/50 (04/15 1115) Pulse Rate: 87 (04/15 1115)  Labs: Recent Labs    12/23/20 0438 12/24/20 0152 12/25/20 0358  HGB 9.1* 9.2* 8.9*  HCT 28.9* 29.3* 28.4*  PLT 261 257 246  CREATININE 2.27* 2.05* 1.95*    Estimated Creatinine Clearance: 61.5 mL/min (A) (by C-G formula based on SCr of 1.95 mg/dL (H)).  Assessment: 60 year old male admitted for hypoxemic respiratory failure and septic shock 2/2 E. Coli bacteremia and UTI, and ATN. Now with Left IJ and subclavian non-occlusive clots.  Pharmacy consulted to start heparin drip. Heparin dosing weight of 113 kg.  Last heparin subq dose a 0540 this am, therefore will bolus.  Goal of Therapy:  Heparin level 0.3-0.7 units/ml Monitor platelets by anticoagulation protocol: Yes   Plan:  Give 6500 units bolus x 1 Start heparin infusion at 1800 units/hr Check anti-Xa level in 6-8 hours and daily while on heparin Continue to monitor H&H and platelets  41, PharmD, [facility] Clinical Pharmacist Please see AMION for all Pharmacists' Contact Phone Numbers 12/25/2020, 12:28 PM

## 2020-12-25 NOTE — Consult Note (Signed)
[facility]    Date of Admission:  12/10/2020     Total days of antibiotics 15         Reason for Consult: MRSE Bacteremia  Referring Provider: Tacy Learn Primary Care Provider: Pcp, No   ASSESSMENT:  [name] is a 60 y/o male admitted with E. Coli bacteremia likely related to infected renal stone and now s/p stent placement. E. Coli bacteremia adequately treated and course complicated by CT/MRI findings of subacute to chronic ischemic infarct, acute DVT in the left internal jugular vein and development of Methicillin resistant Staphylococcus epidermidis likely from previous central line which has been removed. Narrow antibiotics to vancomycin which will also cover MSSA for pneumonia. TTE without evidence of endocarditis. Monitor vancomycin trough and renal function. May need change to ceftaroline if worsening renal function.  Repeat blood cultures in the morning. Continue ventilatory and vasopressor support at present.   PLAN:  1. Narrow antibiotics to vancomycin.  2. Monitor vancomycin trough and renal function per protocol. 3. Repeat blood cultures in the morning for clearance of bacteremia. 4. Ventilatory and vasopressor support per CCM.   Active Problems:   Staphylococcus epidermidis bacteremia   Acute respiratory failure (HCC)   AKI (acute kidney injury) ([place])   Bacteremia due to Escherichia coli   Ureteral stone   Pyelonephritis   Cerebral thrombosis with cerebral infarction   . aspirin  81 mg Per Tube Daily  . atorvastatin  40 mg Per Tube Daily  . chlorhexidine gluconate (MEDLINE KIT)  15 mL Mouth Rinse BID  . Chlorhexidine Gluconate Cloth  6 each Topical Daily  . clonazepam  0.5 mg Per Tube BID  . collagenase   Topical Daily  . docusate  50 mg Per Tube Daily  . feeding supplement (PROSource TF)  90 mL Per Tube TID  . folic acid  1 mg Per Tube Daily  . free water  200 mL Per Tube Q4H  . furosemide  80 mg Intravenous BID  . insulin  aspart  0-15 Units Subcutaneous Q4H  . insulin aspart  6 Units Subcutaneous Q4H  . insulin detemir  25 Units Subcutaneous BID  . mouth rinse  15 mL Mouth Rinse 10 times per day  . multivitamin  15 mL Per Tube Daily  . oxyCODONE  10 mg Per Tube Q6H  . pantoprazole sodium  40 mg Per Tube QHS  . polyethylene glycol  17 g Per Tube Daily  . QUEtiapine  50 mg Per Tube BID  . sodium chloride flush  10-40 mL Intracatheter Q12H  . thiamine  100 mg Per Tube Daily  . vitamin B-12  100 mcg Per Tube Daily     HPI: [name] is a 60 y.o. male with previous medical history of diabetes, chronic pain syndrome, hypertension, and hyperlipidemia admitted from home with diarrhea, vomiting, and altered mental status.  [name] arrived to the room with altered mental status and febrile with a temperature of 103.4 F.  Lab work significant for a lactate of 7.4, creatinine of 5.5 and concern for sepsis.  Gradually became more uncooperative and encephalopathic requiring intubation for airway protection.  Chest x-ray with no pneumothorax, pulmonary edema, pleural effusion, or confluent pulmonary opacities.  Ultrasound abdomen with subtle nodularity of the hepatic contours and coarsened parenchymal echotexture suspicious for cirrhosis with no focal hepatic lesions.  Started on broad-spectrum coverage with vancomycin and Zosyn.  Initial blood cultures positive for E. coli which was pansensitive.  E. coli was also cultured in the urine.  Urology consulted and stent was placed with suspicion for infected stone.  [name] has been intermittently febrile since admission.  Antibiotic therapy has consisted of ceftriaxone from 3/31 until 4/3; cefazolin from 4/4-4/6; cefepime from 4/6-4/8; ceftriaxone from 4/7-4/11.  Off antibiotics from 4/12 until 4/14 when he redeveloped fevers and was placed on ceftriaxone and vancomycin.  Respiratory cultures on 4/5 were with normal respiratory flora.  Respiratory culture on 12/20/2020 with  MSSA.  On 4/14 developed fever once again of 102.8 F with cultures positive for methicillin resistant Staph epidermidis.  Had a previous central line on 12/10/2020 that was removed on 12/23/2020.  New central line placed on 12/24/2020.  Has a coud catheter placed on 4/7.  [name] had an upper extremity venous doppler today that showed an acute deep vein thrombosis involving the left internal jugular and left subclavian vein. TTE with no clear vegetations present. CT scan on 4/56 with age uncertain recent infarct in the right centrum semiovale near the frontal-parietal junction. Follow up MRI with 9 mm late subacute to chronic ischemic infarct involving the posterior right frontal centrum semi ovale and no hemorrhage or mass effect.   [name] currently remains intubated and blood pressure requiring vasopressor support.  History primarily obtained from chart review.  Review of Systems: Review of Systems  Unable to perform ROS: Intubated     No past medical history on file.     No family history on file.  Allergies  Allergen Reactions  . Ibuprofen Other (See Comments)    Blood in the stool.  . Metformin Other (See Comments)    Tremors, muscles  locking up, unable to control extremities    OBJECTIVE: Blood pressure (!) 105/41, pulse 80, temperature 99.2 F (37.3 C), temperature source Oral, resp. rate 20, height '6\' 2"'  (1.88 m), weight (!) 139.9 kg, SpO2 94 %.  Physical Exam Constitutional:      General: He is not in acute distress.    Appearance: He is well-developed. He is obese.     Interventions: He is sedated and intubated.  Cardiovascular:     Rate and Rhythm: Normal rate and regular rhythm.     Heart sounds: Normal heart sounds.  Pulmonary:     Effort: Pulmonary effort is normal. He is intubated.     Breath sounds: Normal breath sounds.  Skin:    General: Skin is warm and dry.  Neurological:     Mental Status: He is oriented to person, place, and time.   Psychiatric:        Behavior: Behavior normal.        Thought Content: Thought content normal.        Judgment: Judgment normal.     Lab Results Lab Results  Component Value Date   WBC 8.0 12/25/2020   HGB 8.9 (L) 12/25/2020   HCT 28.4 (L) 12/25/2020   MCV 101.4 (H) 12/25/2020   PLT 246 12/25/2020    Lab Results  Component Value Date   CREATININE 1.95 (H) 12/25/2020   BUN 101 (H) 12/25/2020   NA 140 12/25/2020   K 3.7 12/25/2020   CL 105 12/25/2020   CO2 29 12/25/2020    Lab Results  Component Value Date   ALT 15 12/25/2020   AST 28 12/25/2020   ALKPHOS 59 12/25/2020   BILITOT 0.7 12/25/2020     Microbiology: Recent Results (from the past 240 hour(s))  Urine Culture  Status: None   Collection Time: 12/16/20  9:53 PM   Specimen: Urine, Cystoscope  Result Value Ref Range Status   Specimen Description URINE, RANDOM  Final   Special Requests PT ON CEFATAN,CYSTO URINE  Final   Culture   Final    NO GROWTH Performed at [facility], [address] [place], [place] [address]    Report Status 12/18/2020 FINAL  Final  Culture, Respiratory w Gram Stain     Status: None   Collection Time: 12/20/20 11:45 AM   Specimen: Tracheal Aspirate; Respiratory  Result Value Ref Range Status   Specimen Description TRACHEAL ASPIRATE  Final   Special Requests NONE  Final   Gram Stain   Final    ABUNDANT WBC PRESENT, PREDOMINANTLY PMN FEW SQUAMOUS EPITHELIAL CELLS PRESENT MODERATE GRAM POSITIVE COCCI IN CLUSTERS RARE GRAM POSITIVE RODS RARE GRAM NEGATIVE RODS Performed at [facility], [place] [address] [place], [place] [address]    Culture FEW STAPHYLOCOCCUS AUREUS  Final   Report Status 12/24/2020 FINAL  Final   Organism ID, Bacteria STAPHYLOCOCCUS AUREUS  Final      Susceptibility   Staphylococcus aureus - MIC*    CIPROFLOXACIN <=0.5 SENSITIVE Sensitive     ERYTHROMYCIN <=0.25 SENSITIVE Sensitive     GENTAMICIN <=0.5 SENSITIVE Sensitive     OXACILLIN 0.5  SENSITIVE Sensitive     TETRACYCLINE <=1 SENSITIVE Sensitive     VANCOMYCIN <=0.5 SENSITIVE Sensitive     TRIMETH/SULFA <=10 SENSITIVE Sensitive     CLINDAMYCIN <=0.25 SENSITIVE Sensitive     RIFAMPIN <=0.5 SENSITIVE Sensitive     Inducible Clindamycin NEGATIVE Sensitive     * FEW STAPHYLOCOCCUS AUREUS  Culture, blood (Routine X 2) w Reflex to ID Panel     Status: None (Preliminary result)   Collection Time: 12/25/20  8:07 AM   Specimen: BLOOD LEFT HAND  Result Value Ref Range Status   Specimen Description BLOOD LEFT HAND  Final   Special Requests   Final    BOTTLES DRAWN AEROBIC ONLY Blood Culture results may not be optimal due to an inadequate volume of blood received in culture bottles   Culture  Setup Time   Final    GRAM POSITIVE COCCI IN CLUSTERS AEROBIC BOTTLE ONLY Performed at [facility], [place] [address], [place], [place] [address]    Culture GRAM POSITIVE COCCI  Final   Report Status PENDING  Incomplete  Culture, blood (Routine X 2) w Reflex to ID Panel     Status: None (Preliminary result)   Collection Time: 12/25/20  8:12 AM   Specimen: BLOOD RIGHT HAND  Result Value Ref Range Status   Specimen Description BLOOD RIGHT HAND  Final   Special Requests   Final    BOTTLES DRAWN AEROBIC ONLY Blood Culture results may not be optimal due to an inadequate volume of blood received in culture bottles   Culture  Setup Time   Final    GRAM POSITIVE COCCI IN CLUSTERS AEROBIC BOTTLE ONLY CRITICAL RESULT CALLED TO, READ BACK BY AND VERIFIED WITH: PHARMD E.WOLFE AT 0737 ON 12/25/2020 BY T.SAAD Performed at [facility], [place] [address], [place], [place] [address]    Culture [facility] POSITIVE COCCI  Final   Report Status PENDING  Incomplete  Blood Culture ID Panel (Reflexed)     Status: Abnormal   Collection Time: 12/25/20  8:12 AM  Result Value Ref Range Status   Enterococcus faecalis NOT DETECTED NOT  DETECTED Final   Enterococcus Faecium NOT DETECTED NOT DETECTED Final    Listeria monocytogenes NOT DETECTED NOT DETECTED Final   Staphylococcus species DETECTED (A) NOT DETECTED Final    Comment: CRITICAL RESULT CALLED TO, READ BACK BY AND VERIFIED WITH: PHARMD E.WOLFE AT 1256 ON 12/25/2020 BY T.SAAD.    Staphylococcus aureus (BCID) NOT DETECTED NOT DETECTED Final   Staphylococcus epidermidis DETECTED (A) NOT DETECTED Final    Comment: Methicillin (oxacillin) resistant coagulase negative staphylococcus. Possible blood culture contaminant (unless isolated from more than one blood culture draw or clinical case suggests pathogenicity). No antibiotic treatment is indicated for blood  culture contaminants.    Staphylococcus lugdunensis NOT DETECTED NOT DETECTED Final   Streptococcus species NOT DETECTED NOT DETECTED Final   Streptococcus agalactiae NOT DETECTED NOT DETECTED Final   Streptococcus pneumoniae NOT DETECTED NOT DETECTED Final   Streptococcus pyogenes NOT DETECTED NOT DETECTED Final   A.calcoaceticus-baumannii NOT DETECTED NOT DETECTED Final   Bacteroides fragilis NOT DETECTED NOT DETECTED Final   Enterobacterales NOT DETECTED NOT DETECTED Final   Enterobacter cloacae complex NOT DETECTED NOT DETECTED Final   Escherichia coli NOT DETECTED NOT DETECTED Final   Klebsiella aerogenes NOT DETECTED NOT DETECTED Final   Klebsiella oxytoca NOT DETECTED NOT DETECTED Final   Klebsiella pneumoniae NOT DETECTED NOT DETECTED Final   Proteus species NOT DETECTED NOT DETECTED Final   Salmonella species NOT DETECTED NOT DETECTED Final   Serratia marcescens NOT DETECTED NOT DETECTED Final   Haemophilus influenzae NOT DETECTED NOT DETECTED Final   Neisseria meningitidis NOT DETECTED NOT DETECTED Final   Pseudomonas aeruginosa NOT DETECTED NOT DETECTED Final   Stenotrophomonas maltophilia NOT DETECTED NOT DETECTED Final   Candida albicans NOT DETECTED NOT DETECTED Final   Candida auris NOT DETECTED NOT DETECTED Final   Candida glabrata NOT DETECTED NOT DETECTED Final    Candida krusei NOT DETECTED NOT DETECTED Final   Candida parapsilosis NOT DETECTED NOT DETECTED Final   Candida tropicalis NOT DETECTED NOT DETECTED Final   Cryptococcus neoformans/gattii NOT DETECTED NOT DETECTED Final   Methicillin resistance mecA/C DETECTED (A) NOT DETECTED Final    Comment: CRITICAL RESULT CALLED TO, READ BACK BY AND VERIFIED WITH: PHARMD E.WOLFE AT 1256 ON 12/25/2020 BY T.SAAD. Performed at [facility], [place] [address], [place], [place] [address]      Terri Piedra, [facility]  12/25/2020  5:15 PM

## 2020-12-25 NOTE — Progress Notes (Addendum)
NAME:  [name], MRN:  [id number], DOB:  [birth year], LOS: 76 ADMISSION DATE:  12/10/2020,   History of Present Illness:  60 year old male who presented 3/31 to the ER from home with 4-5 day hx of diarrhea and vomiting.  He developed confusion and became belligerent at home. Initial ER evaluation found him to be hypotensive, combative, confused, and was intubated.  He was noted to have mottling over his trunk and extremities.  Admitted for hypoxemic respiratory failure and septic shock 2/2 E. Coli bacteremia and UTI, and ATN.   Pertinent  Medical History  Prior smoker quit 15 years ago 82-100-pack-year history Sinusitis  Chronic pain syndrome Hypertension Hyperlipidemia Chronic back pain  Significant Hospital Events: Including procedures, antibiotic start and stop dates in addition to other pertinent events   . 03/31 patient was intubated . 03/31 triple-lumen catheter inserted . 4/1 off pressors. E coli bacteremia, e coli uti. Some improvement in pulm opacities on CXR. On rocephin. TTE with LVEF 40-45%, LV global hypokinesis, G2DD, normal RV, dilated aortic root at 48m.   . 4/5 extubated, failed immediately due to mucous plugging identified on bronch after re-intubated. CT ABD with 4776mleft ureteral stone with mild edema of left kideny, large areas of consolidation in bilateral lung bases.  . 4/6 broadened to cefepime in light of pneumonia on CT abdomen; urology consulted for distal left ureteral stone, to OR overnight for stent placement, UCx sent.  Versed added for agitation (on fent / precedex) . 4/7 Off vasopressors . 4/11 Vasopressors restarted  . 4/13: Left IJ holiday . 4/15: Right IJ placed   Interim History / Subjective:  Sedated on vent. Febrile overnight.   Objective   Blood pressure (!) 95/58, pulse 85, temperature 99.9 F (37.7 C), temperature source Esophageal, resp. rate 20, height _0  (1.88 m), weight (!) 139.9 kg, SpO2 94 %.    Vent Mode: PRVC FiO2 (%):  [40  %] 40 % Set Rate:  [20 bmp] 20 bmp Vt Set:  [660 mL] 660 mL PEEP:  [5 cmH20] 5 cmH20 Plateau Pressure:  [13 cmH20-20 cmH20] 13 cmH20   Intake/Output Summary (Last 24 hours) at 12/25/2020 0705 Last data filed at 12/25/2020 0600 Gross per 24 hour  Intake 6012.12 ml  Output 5800 ml  Net 212.12 ml    Filed Weights   12/19/20 0500 12/20/20 0500 12/23/20 0500  Weight: (!) 145.9 kg (!) 146.8 kg (!) 139.9 kg    Examination:  General: Chronically and critically ill appearing obese middle aged M, reclined in bed intubated, agitated HEENT: NCAT pink mm ETT with some thick tan secretions Neuro: NCAT. LUE flaccid. Moving LE and RUE, shivering, patellar reflex 2+ PULM: Symmetrical chest expansion, diminished bibasilar breath sounds GI: Obese soft ndnt hyperactive bowel sounds  Extremities: No acute joint deformity, no cyanosis or clubbing.  Skin: sacral stage II pressure ulcer with adjacent unstageable ulceration, no purulent drainage   Imaging/Labs  4/11 renal USKoreaminimal left hydronephrosis  4/05 Ct abdomen: 76m65mistal ureteral stone with mild edema left kidney  4/01 renal US:[place] 4/12 MRI> 9mm31mbacute/chronic ischemic infarct of R frontal centrum semi ovale. 4/12 echo: limited bubble study, nondiagnostic  4/12 carotid US: [place] mild 1-39% stenosis carotid, unable to evaluate left  4/14 CT abd/pelvis: left ureteral stent in place, distal left stone still present, bibasilar consolidation 4/11: BAL with MSSA  4/15: blood cultures growing gram+ cocci  Resolved Hospital Problem list   Sepsis Associated DIC Traumatic Urethral Bleeding Septic shock  due to E.Coli UTI, E. Coli bacteremia, Pyelonephritis  Assessment & Plan:   Recurrent Fevers, MSSA Pneumonia  Staph Bacteremia  BAL consistent w/normal flora 4/5. Febrile yesterday to 102.8. CVC d/ced 4/13. Blood cultures positive for Staph, BAL 4/11 positive for MSSA.  P: - f/u BCID, blood culture sensitivities  -vanc started yesterday   -continue rocephin for 5 days  - echo ordered  - repeat carotid US to evaluate left carotid - maintain MAP >65, on Levophed  Acute hypoxic and hypercarbic respiratory failure 2/2 MSSA Pneumonia P sepsis/bacteremia with inability to compensate for increased metabolic demands, increased secretions with poor clearance.  Dense bilateral infiltrates noted on CT abdomen R>L.  Failed extubation 4/5, reintubated with increased secretions / inability to clear. BAL 4/5 w/normal floar & 4/11 w/MSSA. CXR 4/15 w/worsening right sided pneumonia.  P - check ABG  -MV support, continue weaning efforts - increased O2 requirements today  -pulm hygiene, VAP -daily SAT and SBT - requiring Dilaudid gtt, propofol, precedex gtt for adequate sedation.  -LTVV, 4-8cc/kg IBW with goal Pplat <30 and DP<15 -secretions, fluid, agitation extubation barrier - now net negative 8L -hypertonic nebs - on rocephin/vanc  Acute renal failure In setting of sepsis, ATN, pyelonephritis. Continuing to improve with diuresis.  Hypernatremia - resolved L Uretal stone w/o hydronephrosis s/p ureteral stent placement by Urology 4/6. Repeat US w/mild L hydronephrosis.  P -MAP goal > 65  -cont lasix 80 bid  - decreased to 300 cc/hr q4h -OP uro f/u - cont. Coude cath  Agitation Per wife patient had developed spasms and chorea like symptoms for the last several months. He had an initial appointment with neurology in a few months. Some of his agitation may be involuntary movement. Shivering today.  - cont. Sedation and analgesia as above, wean for SAT/SBT  - oxycodone increased yesterday, on klonopin, seroquel, increase seroquel today  - will outpatient f/u with neuro - consider EEG   Subacute vs chronic infarct Posterior R frontal centrum semi ovale -- noted on MRI 4/12 after pt had acute LUE weakness. Echo 4/11 inconclusive, repeat echo today with staph bacteremia.  P -ASA/statin - f/u neuro after d/c  - PT/OT/SLP once  extubated   NASH cirrhosis  LFTs up a bit suspect congestive, INR okay.  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 WOC following.   Best practice (right click and "Reselect all SmartList Selections" daily)  Diet:  Tube Feed  Pain/Anxiety/Delirium protocol (if indicated): Yes (RASS goal -1) VAP protocol (if indicated): Yes DVT prophylaxis: Subcutaneous Heparin and SCD GI prophylaxis: PPI Glucose control:  See above Central venous access:  N/A  Arterial line:  N/A Foley:  Yes, and it is still needed   Mobility:  bed rest  PT consulted: N/A Last date of multidisciplinary goals of care discussion: wife updated 4/14 at bedside.  Code Status:  full code Disposition: ICU  Jordyan Hardiman A, DO 12/25/2020, 7:05 AM Pager: [phone]

## 2020-12-26 DIAGNOSIS — J15211 Pneumonia due to Methicillin susceptible Staphylococcus aureus: Secondary | ICD-10-CM

## 2020-12-26 LAB — URINALYSIS, ROUTINE W REFLEX MICROSCOPIC
Bacteria, UA: NONE SEEN
Bilirubin Urine: NEGATIVE
Bilirubin Urine: NEGATIVE
Glucose, UA: NEGATIVE mg/dL
Glucose, UA: NEGATIVE mg/dL
Ketones, ur: NEGATIVE mg/dL
Ketones, ur: NEGATIVE mg/dL
Leukocytes,Ua: NEGATIVE
Nitrite: NEGATIVE
Nitrite: NEGATIVE
Protein, ur: 30 mg/dL — AB
Protein, ur: 100 mg/dL — AB
RBC / HPF: >50 RBC/hpf — ABNORMAL HIGH (ref 0–5)
RBC / HPF: >50 RBC/hpf — ABNORMAL HIGH (ref 0–5)
Specific Gravity, Urine: 1.013 (ref 1.005–1.030)
Specific Gravity, Urine: 1.013 (ref 1.005–1.030)
pH: 5 (ref 5.0–8.0)
pH: 5 (ref 5.0–8.0)

## 2020-12-26 LAB — COMPREHENSIVE METABOLIC PANEL
ALT: 16 U/L (ref 0–44)
AST: 29 U/L (ref 15–41)
Albumin: 2 g/dL — ABNORMAL LOW (ref 3.5–5.0)
Alkaline Phosphatase: 56 U/L (ref 38–126)
Anion gap: 8 (ref 5–15)
BUN: 87 mg/dL — ABNORMAL HIGH (ref 6–20)
CO2: 31 mmol/L (ref 22–32)
Calcium: 8.6 mg/dL — ABNORMAL LOW (ref 8.9–10.3)
Chloride: 106 mmol/L (ref 98–111)
Creatinine, Ser: 1.75 mg/dL — ABNORMAL HIGH (ref 0.61–1.24)
GFR, Estimated: 45 mL/min — ABNORMAL LOW (ref >60)
Glucose, Bld: 203 mg/dL — ABNORMAL HIGH (ref 70–99)
Potassium: 3.5 mmol/L (ref 3.5–5.1)
Sodium: 145 mmol/L (ref 135–145)
Total Bilirubin: 0.7 mg/dL (ref 0.3–1.2)
Total Protein: 7 g/dL (ref 6.5–8.1)

## 2020-12-26 LAB — GLUCOSE, CAPILLARY
Glucose-Capillary: 182 mg/dL — ABNORMAL HIGH (ref 70–99)
Glucose-Capillary: 169 mg/dL — ABNORMAL HIGH (ref 70–99)
Glucose-Capillary: 141 mg/dL — ABNORMAL HIGH (ref 70–99)
Glucose-Capillary: 107 mg/dL — ABNORMAL HIGH (ref 70–99)
Glucose-Capillary: 138 mg/dL — ABNORMAL HIGH (ref 70–99)
Glucose-Capillary: 143 mg/dL — ABNORMAL HIGH (ref 70–99)

## 2020-12-26 LAB — CBC WITH DIFFERENTIAL/PLATELET
Abs Immature Granulocytes: 0.03 10*3/uL (ref 0.00–0.07)
Basophils Absolute: 0 10*3/uL (ref 0.0–0.1)
Basophils Relative: 1 %
Eosinophils Absolute: 0.2 10*3/uL (ref 0.0–0.5)
Eosinophils Relative: 2 %
HCT: 27.9 % — ABNORMAL LOW (ref 39.0–52.0)
Hemoglobin: 8.8 g/dL — ABNORMAL LOW (ref 13.0–17.0)
Immature Granulocytes: 0 %
Lymphocytes Relative: 16 %
Lymphs Abs: 1.3 10*3/uL (ref 0.7–4.0)
MCH: 32.4 pg (ref 26.0–34.0)
MCHC: 31.5 g/dL (ref 30.0–36.0)
MCV: 102.6 fL — ABNORMAL HIGH (ref 80.0–100.0)
Monocytes Absolute: 0.5 10*3/uL (ref 0.1–1.0)
Monocytes Relative: 7 %
Neutro Abs: 6 10*3/uL (ref 1.7–7.7)
Neutrophils Relative %: 74 %
Platelets: 238 10*3/uL (ref 150–400)
RBC: 2.72 MIL/uL — ABNORMAL LOW (ref 4.22–5.81)
RDW: 13.9 % (ref 11.5–15.5)
WBC: 8 10*3/uL (ref 4.0–10.5)
nRBC: 0 % (ref 0.0–0.2)

## 2020-12-26 LAB — HEPARIN LEVEL (UNFRACTIONATED)
Heparin Unfractionated: 0.21 IU/mL — ABNORMAL LOW (ref 0.30–0.70)
Heparin Unfractionated: 0.17 IU/mL — ABNORMAL LOW (ref 0.30–0.70)
Heparin Unfractionated: 0.16 IU/mL — ABNORMAL LOW (ref 0.30–0.70)

## 2020-12-26 LAB — TRIGLYCERIDES: Triglycerides: 142 mg/dL (ref <150)

## 2020-12-26 MED ORDER — HALOPERIDOL LACTATE 5 MG/ML IJ SOLN: 1.0000 mg | Freq: Four times a day (QID) | INTRAMUSCULAR | Status: DC | PRN

## 2020-12-26 MED ORDER — HEPARIN (PORCINE) 25000 UT/250ML-% IV SOLN
3100.0000 [IU]/h | INTRAVENOUS | Status: DC
  Administered 2020-12-26: 2650 [IU]/h via INTRAVENOUS
  Administered 2020-12-26: 2400 [IU]/h via INTRAVENOUS
  Administered 2020-12-26 – 2020-12-28 (×7): 2900 [IU]/h via INTRAVENOUS
  Administered 2020-12-29: 3100 [IU]/h via INTRAVENOUS
  Administered 2020-12-29: 3000 [IU]/h via INTRAVENOUS
  Administered 2020-12-30 (×2): 3100 [IU]/h via INTRAVENOUS
  Filled 2020-12-26 (×11): qty 250

## 2020-12-26 MED ORDER — HALOPERIDOL LACTATE 5 MG/ML IJ SOLN
2.0000 mg | Freq: Once | INTRAMUSCULAR | Status: AC
  Administered 2020-12-26: 2 mg via INTRAVENOUS
  Filled 2020-12-26: qty 1

## 2020-12-26 MED ORDER — OXYCODONE HCL 5 MG PO TABS
5.0000 mg | ORAL_TABLET | Freq: Four times a day (QID) | ORAL | Status: DC | PRN
  Administered 2020-12-31: 5 mg via ORAL
  Filled 2020-12-26: qty 1

## 2020-12-26 MED ORDER — HALOPERIDOL LACTATE 5 MG/ML IJ SOLN
1.0000 mg | Freq: Four times a day (QID) | INTRAMUSCULAR | Status: DC | PRN
  Administered 2020-12-26: 2 mg via INTRAVENOUS
  Filled 2020-12-26: qty 1

## 2020-12-26 MED ORDER — HALOPERIDOL LACTATE 5 MG/ML IJ SOLN: 2.0000 mg | Freq: Once | INTRAMUSCULAR | Status: DC

## 2020-12-26 MED ORDER — PANTOPRAZOLE SODIUM 40 MG IV SOLR
40.0000 mg | INTRAVENOUS | Status: DC
  Administered 2020-12-26 – 2020-12-27 (×2): 40 mg via INTRAVENOUS
  Filled 2020-12-26 (×2): qty 40

## 2020-12-26 MED ORDER — ACETAMINOPHEN 650 MG RE SUPP
650.0000 mg | RECTAL | Status: DC | PRN
  Administered 2020-12-26: 650 mg via RECTAL
  Filled 2020-12-26: qty 1

## 2020-12-26 MED ORDER — HEPARIN BOLUS VIA INFUSION
3500.0000 [IU] | Freq: Once | INTRAVENOUS | Status: AC
  Administered 2020-12-26: 3500 [IU] via INTRAVENOUS
  Filled 2020-12-26: qty 3500

## 2020-12-26 MED ORDER — NOREPINEPHRINE 16 MG/250ML-% IV SOLN
0.0000 ug/min | INTRAVENOUS | Status: DC
  Administered 2020-12-26: 13 ug/min via INTRAVENOUS
  Administered 2020-12-26: 18 ug/min via INTRAVENOUS
  Filled 2020-12-26 (×3): qty 250

## 2020-12-26 MED ORDER — HEPARIN BOLUS VIA INFUSION
1500.0000 [IU] | Freq: Once | INTRAVENOUS | Status: AC
  Administered 2020-12-26: 1500 [IU] via INTRAVENOUS
  Filled 2020-12-26: qty 1500

## 2020-12-26 MED ORDER — FENTANYL CITRATE (PF) 100 MCG/2ML IJ SOLN
25.0000 ug | Freq: Once | INTRAMUSCULAR | Status: AC
  Administered 2020-12-26: 25 ug via INTRAVENOUS
  Filled 2020-12-26: qty 2

## 2020-12-26 MED ORDER — FENTANYL CITRATE (PF) 100 MCG/2ML IJ SOLN
25.0000 ug | INTRAMUSCULAR | Status: DC | PRN
Start: 2020-12-26 — End: 2021-01-06
  Administered 2020-12-26 – 2021-01-06 (×9): 50 ug via INTRAVENOUS
  Filled 2020-12-26 (×12): qty 2

## 2020-12-26 NOTE — Procedures (Signed)
Extubation Procedure Note  Patient Details:   Name: [name] DOB: 1961-01-27 MRN: [id number]   Airway Documentation:    Vent end date: 12/26/20 Vent end time: 0832   Evaluation  O2 sats: currently acceptable Complications: No apparent complications Patient did tolerate procedure well. Bilateral Breath Sounds: Diminished rhonchi    Yes   Patient extubated per order to 4L [place]. Patient desated into the 80's with no improvement increasing to 6L so RT placed patient on 15L [place]. Sats increased to 92%. Positive cuff leak was noted prior to extubation. Patient is alert and is able to follow some commands and speak but is very agitated and cussing at this RT and RN. RN and CCM giving medication to help calm patient down. Vitals are currently stable. RT will continue to monitor.   Dezarae Mcclaran [place] 12/26/2020, 10:03 AM

## 2020-12-26 NOTE — Progress Notes (Signed)
RN wasted 90 ml of dilaudid IV and poured down stericycle with Sharene Skeans, RN as witness. Unable to waste in pyxis due to medication coming from pharmacy

## 2020-12-26 NOTE — Progress Notes (Signed)
ANTICOAGULATION CONSULT NOTE - Follow Up Consult  Pharmacy Consult for Heparin Indication: Left IJ and subclavian non-occlusive clots  Allergies  Allergen Reactions  . Ibuprofen Other (See Comments)    Blood in the stool.  . Metformin Other (See Comments)    Tremors, muscles  locking up, unable to control extremities    Patient Measurements: Height: 6\' 2"  (188 cm) Weight: 128.7 kg (283 lb 11.7 oz) IBW/kg (Calculated) : 82.2  Heparin dosing wt: 110 kg  Vital Signs: Temp: 100 F (37.8 C) (04/15 2309) Temp Source: Oral (04/15 2309) BP: 93/80 (04/16 0315) Pulse Rate: 100 (04/16 0315)  Labs: Recent Labs    12/23/20 0438 12/24/20 0152 12/25/20 0358 12/25/20 1934 12/26/20 0249  HGB 9.1* 9.2* 8.9*  --  8.8*  HCT 28.9* 29.3* 28.4*  --  27.9*  PLT 261 257 246  --  238  HEPARINUNFRC  --   --   --  0.15* 0.21*  CREATININE 2.27* 2.05* 1.95*  --   --     Estimated Creatinine Clearance: 58.9 mL/min (A) (by C-G formula based on SCr of 1.95 mg/dL (H)).  Assessment: 60 year old male admitted for hypoxemic respiratory failure and septic shock 2/2 E. Coli bacteremia and UTI, and ATN. Now with Left IJ and subclavian non-occlusive clots.  Pharmacy consulted to start heparin drip. Heparin dosing weight of 113 kg.   Heparin level subtherapeutic (0.21) on gtt at 2150 units/hr. No issues with line or bleeding reported per RN. Pink-tinged urine but not bloody per RN - RN notes that pt pulls on foley some.   Goal of Therapy:  Heparin level 0.3-0.7 units/ml Monitor platelets by anticoagulation protocol: Yes   Plan:  Rebolus heparin 1500 units Increase heparin to 2400 units/hr Heparin level in 6 hours  2151, PharmD, BCPS Please see amion for complete clinical pharmacist phone list 12/26/2020, 3:33 AM

## 2020-12-26 NOTE — Progress Notes (Signed)
RT note. Pt. Currently not being placed on BIPAP right now due to restraints and agitation. Pt. Currently on 13L [place], servo is in room when ready to trial patient on BIPAP. RN aware. RT will continue to monitor, VSS

## 2020-12-26 NOTE — Progress Notes (Signed)
NAME:  [name], MRN:  [id number], DOB:  [birth year], LOS: 73 ADMISSION DATE:  12/10/2020,   History of Present Illness:  60 year old male who presented 3/31 to the ER from home with 4-5 day hx of diarrhea and vomiting.  He developed confusion and became belligerent at home. Initial ER evaluation found him to be hypotensive, combative, confused, and was intubated.  He was noted to have mottling over his trunk and extremities.  Admitted for hypoxemic respiratory failure and septic shock 2/2 E. Coli bacteremia and UTI, and ATN.   Pertinent  Medical History  Prior smoker quit 15 years ago 82-100-pack-year history Sinusitis  Chronic pain syndrome Hypertension Hyperlipidemia Chronic back pain  Significant Hospital Events: Including procedures, antibiotic start and stop dates in addition to other pertinent events   . 03/31 patient was intubated . 03/31 triple-lumen catheter inserted . 4/1 off pressors. E coli bacteremia, e coli uti. Some improvement in pulm opacities on CXR. On rocephin. TTE with LVEF 40-45%, LV global hypokinesis, G2DD, normal RV, dilated aortic root at 61m.   . 4/5 extubated, failed immediately due to mucous plugging identified on bronch after re-intubated. CT ABD with 4768mleft ureteral stone with mild edema of left kideny, large areas of consolidation in bilateral lung bases.  . 4/6 broadened to cefepime in light of pneumonia on CT abdomen; urology consulted for distal left ureteral stone, to OR overnight for stent placement, UCx sent.  Versed added for agitation (on fent / precedex) . 4/7 Off vasopressors . 4/11 Vasopressors restarted  . 4/13: Left IJ holiday . 4/15: Right IJ placed  . 4/16: extubated  Interim History / Subjective:  Patient tolerated pressure support trial, extubated but he is very agitated and restless in the bed  Objective   Blood pressure (!) 114/57, pulse 70, temperature (!) 101.8 F (38.8 C), temperature source Axillary, resp. rate 20, height  _0  (1.88 m), weight 128.7 kg, SpO2 94 %.    Vent Mode: PRVC FiO2 (%):  [40 %] 40 % Set Rate:  [20 bmp] 20 bmp Vt Set:  [6[841L] 660 mL PEEP:  [5 cmH20] 5 cmH20 Plateau Pressure:  [17 cmH20-19 cmH20] 19 cmH20   Intake/Output Summary (Last 24 hours) at 12/26/2020 0948 Last data filed at 12/26/2020 0600 Gross per 24 hour  Intake 5053.71 ml  Output 6250 ml  Net -1196.29 ml    Filed Weights   12/23/20 0500 12/25/20 2000 12/26/20 0310  Weight: (!) 139.9 kg (!) 139.9 kg 128.7 kg    Examination:  General: Chronically and critically ill appearing obese middle aged M, restless and agitated in the bed HEENT: Atraumatic, normocephalic, moist mucous membranes, no JVD Neuro: Awake, alert, agitated, following simple commands, plegic left upper extremity PULM: Reduced air entry at bases right more than left otherwise clear GI: Obese soft ndnt hyperactive bowel sounds  Extremities: No acute joint deformity, no cyanosis or clubbing.  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 4/12 echo: limited bubble study, nondiagnostic  4/12 carotid US: [place] mild 1-39% stenosis carotid, unable to evaluate left  4/14 CT abd/pelvis: left ureteral stent in place, distal left stone still present, bibasilar consolidation 4/11: BAL with MSSA  4/15: blood cultures growing gram+ cocci  Resolved Hospital Problem list    Traumatic Urethral Bleeding Septic shock due to E.Coli UTI, E. Coli bacteremia, Pyelonephritis Hypernatremia  Assessment & Plan:  Sepsis with Recurrent Fevers E. coli bacteremia MSSA Pneumonia  MRSE bacteremia  Patient continued to spike fever, T-max 101.6 last night His blood culture grew MRSE Continue  IV vancomycin He completed therapy with ceftriaxone for MSSA pneumonia and E. coli bacteremia   Acute hypoxic and hypercarbic respiratory failure 2/2 MSSA Pneumonia Patient tolerated pressure support trial this morning Sedation was turned off He was extubated but remained very agitated Currently on facemask oxygen to maintain O2 sat 88-92%  Acute kidney injury In setting of sepsis, ATN, pyelonephritis. Continuing to improve with diuresis.  L Uretal stone w/o hydronephrosis s/p ureteral stent placement by Urology 4/6. Repeat US w/mild L hydronephrosis.  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 Echo 4/11 inconclusive, repeat echo today with staph bacteremia.  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 WOC following.   Best practice (right click and "Reselect all SmartList Selections" daily)  Diet: NPO/speech and swallow evaluation Pain/Anxiety/Delirium protocol (if indicated): Precedex infusion with RASS goal -1 VAP protocol (if indicated): N/A DVT prophylaxis: Subcutaneous Heparin and SCD GI prophylaxis: PPI Glucose control:  See above Central venous access:  N/A  Arterial line:  N/A Foley:  Yes, and it is still needed    Mobility:  bed rest  PT consulted: N/A Last date of multidisciplinary goals of care discussion: wife updated 4/14 at bedside.  Code Status:  full code Disposition: ICU  Total critical care time: 41 minutes  Performed by: [place] care time was exclusive of separately billable procedures and treating other patients.   Critical care was necessary to treat or prevent imminent or life-threatening deterioration.   Critical care was time spent personally by me on the following activities: development of treatment plan with patient and/or surrogate as well as nursing, discussions with consultants, evaluation of patient's response to treatment, examination of patient, obtaining history from patient or surrogate, ordering and performing treatments and interventions, ordering and review of laboratory studies, ordering and review of radiographic studies, pulse oximetry and re-evaluation of patient's condition.   Jacky Kindle MD  Pulmonary Critical Care See Amion for pager If no response to pager, please call [phone] until 7pm After 7pm, Please call E-link [phone],

## 2020-12-26 NOTE — Progress Notes (Signed)
ANTICOAGULATION CONSULT NOTE - Follow Up Consult  Pharmacy Consult for Heparin Indication: Left IJ and subclavian non-occlusive clots  Allergies  Allergen Reactions  . Ibuprofen Other (See Comments)    Blood in the stool.  . Metformin Other (See Comments)    Tremors, muscles  locking up, unable to control extremities    Patient Measurements: Height: 6\' 2"  (188 cm) Weight: 128.7 kg (283 lb 11.7 oz) IBW/kg (Calculated) : 82.2  Heparin dosing wt: 110 kg  Vital Signs: Temp: 102.6 F (39.2 C) (04/16 1514) Temp Source: Axillary (04/16 1514) BP: 115/57 (04/16 1434) Pulse Rate: 92 (04/16 1434)  Labs: Recent Labs    12/24/20 0152 12/25/20 0358 12/25/20 1934 12/26/20 0249 12/26/20 0904 12/26/20 1600  HGB 9.2* 8.9*  --  8.8*  --   --   HCT 29.3* 28.4*  --  27.9*  --   --   PLT 257 246  --  238  --   --   HEPARINUNFRC  --   --    < > 0.21* 0.17* 0.16*  CREATININE 2.05* 1.95*  --  1.75*  --   --    < > = values in this interval not displayed.    Estimated Creatinine Clearance: 65.6 mL/min (A) (by C-G formula based on SCr of 1.75 mg/dL (H)).  Assessment: 60 year old male admitted for hypoxemic respiratory failure and septic shock 2/2 E. Coli bacteremia and UTI, and ATN. Now with Left IJ and subclavian non-occlusive clots.  Pharmacy consulted to start heparin drip. Heparin dosing weight of 113 kg.   Heparin level remains subtherapeutic s/p rate increase to 2650 units/hr, RN confirms rate and no infusion issues at this time  Goal of Therapy:  Heparin level 0.3-0.7 units/ml Monitor platelets by anticoagulation protocol: Yes   Plan:  Heparin 3500 units IV x 1, and gtt increase to 2900 units/hr F/u heparin level in 6 hours F/u long term AC plan and ability to transition to PO vs lovenox  41, PharmD Clinical Pharmacist ED Pharmacist Phone # [phone] 12/26/2020 5:55 PM

## 2020-12-26 NOTE — Progress Notes (Signed)
eLink Physician-Brief Progress Note Patient Name: [name] DOB: 02-04-1961 MRN: [id number]   Date of Service  12/26/2020  HPI/Events of Note  Per bedside RN patient has amber discoloration of his urine.  eICU Interventions  Urinalysis ordered.        Thomasene Lot Marilee Ditommaso 12/26/2020, 12:09 AM

## 2020-12-26 NOTE — Progress Notes (Signed)
ANTICOAGULATION CONSULT NOTE - Follow Up Consult  Pharmacy Consult for Heparin Indication: Left IJ and subclavian non-occlusive clots  Allergies  Allergen Reactions  . Ibuprofen Other (See Comments)    Blood in the stool.  . Metformin Other (See Comments)    Tremors, muscles  locking up, unable to control extremities    Patient Measurements: Height: 6\' 2"  (188 cm) Weight: 128.7 kg (283 lb 11.7 oz) IBW/kg (Calculated) : 82.2  Heparin dosing wt: 110 kg  Vital Signs: Temp: 101.8 F (38.8 C) (04/16 0731) Temp Source: Axillary (04/16 0731) BP: 114/57 (04/16 0743) Pulse Rate: 70 (04/16 0743)  Labs: Recent Labs    12/24/20 0152 12/25/20 0358 12/25/20 1934 12/26/20 0249 12/26/20 0904  HGB 9.2* 8.9*  --  8.8*  --   HCT 29.3* 28.4*  --  27.9*  --   PLT 257 246  --  238  --   HEPARINUNFRC  --   --  0.15* 0.21* 0.17*  CREATININE 2.05* 1.95*  --  1.75*  --     Estimated Creatinine Clearance: 65.6 mL/min (A) (by C-G formula based on SCr of 1.75 mg/dL (H)).  Assessment: 60 year old male admitted for hypoxemic respiratory failure and septic shock 2/2 E. Coli bacteremia and UTI, and ATN. Now with Left IJ and subclavian non-occlusive clots.  Pharmacy consulted to start heparin drip. Heparin dosing weight of 113 kg.   Heparin level subtherapeutic and further down (0.17) on gtt at 2400 units/hr after bolus of 1500 units x1. No issues with line or bleeding reported per RN - observed PIV myself and no extravasation noted. No further pink-tinged urine. Level obtained from CVC and heparin running in PIV.   Goal of Therapy:  Heparin level 0.3-0.7 units/ml Monitor platelets by anticoagulation protocol: Yes   Plan:  Rebolus heparin 1500 units again x1 Increase heparin to 2650 units/hr Heparin level in 6 hours Consider Lovenox if no further procedures.   41, PharmD, BCPS, BCCCP Clinical Pharmacist Please refer to [facility] for [facility] Pharmacy numbers 12/26/2020, 9:41 AM

## 2020-12-26 NOTE — Progress Notes (Signed)
Physical Therapy Wound Treatment Patient Details  Name: [name] MRN: [id number] Date of Birth: 1961-02-27  Today's Date: 12/26/2020 Time: 3893-7342 Time Calculation (min): 32 min  Subjective  Subjective Assessment Subjective: "Get me out of here!" Pt restless throughout session; confused Patient and Family Stated Goals: None stated Date of Onset:  (Unknown) Prior Treatments:  (Dressing changes)  Pain Score:    Wound Assessment  Pressure Injury 12/21/20 Right Deep Tissue Pressure Injury - Purple or maroon localized area of discolored intact skin or blood-filled blister due to damage of underlying soft tissue from pressure and/or shear. purple with blister area that has torn (Active)  Wound Image  12/25/20 1100  Dressing Type ABD;Barrier Film (skin prep);Gauze (Comment);Moist to moist 12/26/20 1349  Dressing Changed;Clean;Dry;Intact 12/26/20 1349  Dressing Change Frequency Daily 12/26/20 1349  State of Healing Eschar 12/26/20 1349  Site / Wound Assessment Black;Red;Yellow 12/26/20 1349  % Wound base Red or Granulating 25% 12/26/20 1349  % Wound base Yellow/Fibrinous Exudate 5% 12/26/20 1349  % Wound base Black/Eschar 70% 12/26/20 1349  % Wound base Other/Granulation Tissue (Comment) 0% 12/26/20 1349  Peri-wound Assessment Bleeding;Maceration 12/26/20 1349  Wound Length (cm) 10.3 cm 12/25/20 1100  Wound Width (cm) 5 cm 12/25/20 1100  Wound Depth (cm) 0.1 cm 12/25/20 1100  Wound Surface Area (cm^2) 51.5 cm^2 12/25/20 1100  Wound Volume (cm^3) 5.15 cm^3 12/25/20 1100  Tunneling (cm) 0 12/25/20 1100  Undermining (cm) 0 12/25/20 1100  Margins Unattached edges (unapproximated) 12/26/20 1349  Drainage Amount Minimal 12/26/20 1349  Drainage Description Serosanguineous 12/26/20 1349  Treatment Hydrotherapy (Pulse lavage) 12/26/20 1349      Hydrotherapy Pulsed lavage therapy - wound location: R buttock Pulsed Lavage with Suction (psi): 16 psi Pulsed Lavage with Suction - Normal  Saline Used: 1000 mL Pulsed Lavage Tip: Tip with splash shield Selective Debridement Selective Debridement - Location: R buttock Selective Debridement - Tools Used: Other (comment) (pulse lavage only due to restless/moving a lot) Selective Debridement - Tissue Removed: Patient too restless/moving too much for sharp debridement. Santyl applied and re-dressed    Wound Assessment and Plan  Wound Therapy - Assess/Plan/Recommendations Wound Therapy - Clinical Statement: Patient extubated this morning and sedation significantly lighter with pt very restless, almost to point of agitation. 2 person assist to hold him on his left side and able to tolerate pulsed lavage. Patient on heparin and moving far too much to attempt sharp debridement. If cognition does not improve with pt able to lie still on his left side, may need to discontinue hydrotherapy. Wound Therapy - Functional Problem List: Global weakness in the setting of critical illness and extended ICU stay. Factors Delaying/Impairing Wound Healing: Immobility,Multiple medical problems,Polypharmacy,Infection - systemic/local Hydrotherapy Plan: Debridement,Dressing change,Patient/family education,Pulsatile lavage with suction Wound Therapy - Frequency: 6X / week Wound Therapy - Follow Up Recommendations: dressing changes by RN  Wound Therapy Goals- Improve the function of patient's integumentary system by progressing the wound(s) through the phases of wound healing (inflammation - proliferation - remodeling) by: Wound Therapy Goals - Improve the function of patient's integumentary system by progressing the wound(s) through the phases of wound healing by: Decrease Necrotic Tissue to: 25% Decrease Necrotic Tissue - Progress: Progressing toward goal Increase Granulation Tissue to: 75% Increase Granulation Tissue - Progress: Progressing toward goal Improve Drainage Characteristics: Min,Serous Improve Drainage Characteristics - Progress: Progressing  toward goal Goals/treatment plan/discharge plan were made with and agreed upon by patient/family: No, Patient unable to participate in goals/treatment/discharge plan and family unavailable Time  For Goal Achievement: 7 days Wound Therapy - Potential for Goals: Good  Goals will be updated until maximal potential achieved or discharge criteria met.  Discharge criteria: when goals achieved, discharge from hospital, MD decision/surgical intervention, no progress towards goals, refusal/missing three consecutive treatments without notification or medical reason.  GP     Charges PT Wound Care Charges $Wound Debridement up to 20 cm: < or equal to 20 cm $ Wound Debridement each add'l 20 sqcm: 1 $PT PLS Gun and Tip: 1 Supply $PT Hydrotherapy Visit: 1 Visit       Arby Barrette, PT Pager [phone]  Rexanne Mano 12/26/2020, 1:58 PM

## 2020-12-26 NOTE — Progress Notes (Signed)
eLink Physician-Brief Progress Note Patient Name: [name] DOB: Jan 21, 1961 MRN: [id number]   Date of Service  12/26/2020  HPI/Events of Note  Bedside RN needs order for po Pantoprazole changed to iv since patient is NPO.  eICU Interventions  Order changed.        Thomasene Lot Isadora Delorey 12/26/2020, 10:14 PM

## 2020-12-26 NOTE — Progress Notes (Signed)
eLink Physician-Brief Progress Note Patient Name: [name] DOB: September 14, 1960 MRN: [id number]   Date of Service  12/26/2020  HPI/Events of Note  Patient with a fever and agitated delirium which is likely due to pain, QTc 484.  eICU Interventions  Haldol ordered to facilitate placement of an NG tube for medication access (Tylenol for fever / pain and PRN Oxycodone for pain).        Thomasene Lot Bentlee Drier 12/26/2020, 8:50 PM

## 2020-12-27 ENCOUNTER — Inpatient Hospital Stay (HOSPITAL_COMMUNITY): Payer: Medicaid Other

## 2020-12-27 LAB — CBC
HCT: 30.5 % — ABNORMAL LOW (ref 39.0–52.0)
Hemoglobin: 9.4 g/dL — ABNORMAL LOW (ref 13.0–17.0)
MCH: 31.3 pg (ref 26.0–34.0)
MCHC: 30.8 g/dL (ref 30.0–36.0)
MCV: 101.7 fL — ABNORMAL HIGH (ref 80.0–100.0)
Platelets: 228 10*3/uL (ref 150–400)
RBC: 3 MIL/uL — ABNORMAL LOW (ref 4.22–5.81)
RDW: 13.7 % (ref 11.5–15.5)
WBC: 7.6 10*3/uL (ref 4.0–10.5)
nRBC: 0 % (ref 0.0–0.2)

## 2020-12-27 LAB — PHOSPHORUS: Phosphorus: 3.6 mg/dL (ref 2.5–4.6)

## 2020-12-27 LAB — BASIC METABOLIC PANEL
Anion gap: 9 (ref 5–15)
BUN: 72 mg/dL — ABNORMAL HIGH (ref 6–20)
CO2: 33 mmol/L — ABNORMAL HIGH (ref 22–32)
Calcium: 9.2 mg/dL (ref 8.9–10.3)
Chloride: 113 mmol/L — ABNORMAL HIGH (ref 98–111)
Creatinine, Ser: 1.71 mg/dL — ABNORMAL HIGH (ref 0.61–1.24)
GFR, Estimated: 46 mL/min — ABNORMAL LOW (ref >60)
Glucose, Bld: 141 mg/dL — ABNORMAL HIGH (ref 70–99)
Potassium: 3.6 mmol/L (ref 3.5–5.1)
Sodium: 155 mmol/L — ABNORMAL HIGH (ref 135–145)

## 2020-12-27 LAB — URINALYSIS, ROUTINE W REFLEX MICROSCOPIC
Bilirubin Urine: NEGATIVE
Glucose, UA: NEGATIVE mg/dL
Ketones, ur: NEGATIVE mg/dL
Nitrite: NEGATIVE
Protein, ur: 100 mg/dL — AB
RBC / HPF: >50 RBC/hpf — ABNORMAL HIGH (ref 0–5)
Specific Gravity, Urine: 1.014 (ref 1.005–1.030)
pH: 5 (ref 5.0–8.0)

## 2020-12-27 LAB — GLUCOSE, CAPILLARY
Glucose-Capillary: 133 mg/dL — ABNORMAL HIGH (ref 70–99)
Glucose-Capillary: 132 mg/dL — ABNORMAL HIGH (ref 70–99)
Glucose-Capillary: 159 mg/dL — ABNORMAL HIGH (ref 70–99)
Glucose-Capillary: 218 mg/dL — ABNORMAL HIGH (ref 70–99)
Glucose-Capillary: 201 mg/dL — ABNORMAL HIGH (ref 70–99)
Glucose-Capillary: 209 mg/dL — ABNORMAL HIGH (ref 70–99)

## 2020-12-27 LAB — CULTURE, BLOOD (ROUTINE X 2)

## 2020-12-27 LAB — APTT: aPTT: 75 seconds — ABNORMAL HIGH (ref 24–36)

## 2020-12-27 LAB — HEPARIN LEVEL (UNFRACTIONATED)
Heparin Unfractionated: 0.38 IU/mL (ref 0.30–0.70)
Heparin Unfractionated: 0.31 IU/mL (ref 0.30–0.70)

## 2020-12-27 LAB — PROTIME-INR
INR: 1.3 — ABNORMAL HIGH (ref 0.8–1.2)
Prothrombin Time: 16.1 seconds — ABNORMAL HIGH (ref 11.4–15.2)

## 2020-12-27 LAB — VANCOMYCIN, PEAK: Vancomycin Pk: 40 ug/mL (ref 30–40)

## 2020-12-27 LAB — MAGNESIUM: Magnesium: 2.4 mg/dL (ref 1.7–2.4)

## 2020-12-27 MED ORDER — DEXMEDETOMIDINE HCL IN NACL 400 MCG/100ML IV SOLN
0.4000 ug/kg/h | INTRAVENOUS | Status: DC
  Administered 2020-12-27: 0.8 ug/kg/h via INTRAVENOUS
  Administered 2020-12-27: 0.7 ug/kg/h via INTRAVENOUS
  Administered 2020-12-27: 0.8 ug/kg/h via INTRAVENOUS
  Administered 2020-12-28: 0.6 ug/kg/h via INTRAVENOUS
  Administered 2020-12-28: 0.2 ug/kg/h via INTRAVENOUS
  Administered 2020-12-28: 0.4 ug/kg/h via INTRAVENOUS
  Administered 2020-12-28: 0.6 ug/kg/h via INTRAVENOUS
  Administered 2020-12-29: 0.5 ug/kg/h via INTRAVENOUS
  Filled 2020-12-27 (×8): qty 100

## 2020-12-27 MED ORDER — FREE WATER
300.0000 mL | Status: DC
  Administered 2020-12-27 – 2020-12-28 (×6): 300 mL

## 2020-12-27 NOTE — Progress Notes (Signed)
eLink Physician-Brief Progress Note Patient Name: [name] DOB: 07-02-1961 MRN: [id number]   Date of Service  12/27/2020  HPI/Events of Note  Patient with blood tinged urine.  eICU Interventions  Urinalysis and coagulation parameters ordered  to better evaluate the problem.        Thomasene Lot Birdella Sippel 12/27/2020, 6:08 AM

## 2020-12-27 NOTE — Progress Notes (Signed)
ANTICOAGULATION CONSULT NOTE - Follow Up Consult  Pharmacy Consult for Heparin Indication: Left IJ and subclavian non-occlusive clots  Allergies  Allergen Reactions  . Ibuprofen Other (See Comments)    Blood in the stool.  . Metformin Other (See Comments)    Tremors, muscles  locking up, unable to control extremities    Patient Measurements: Height: 6\' 2"  (188 cm) Weight: 128.7 kg (283 lb 11.7 oz) IBW/kg (Calculated) : 82.2  Heparin dosing wt: 110 kg  Vital Signs: Temp: 100.9 F (38.3 C) (04/17 0800) Temp Source: Rectal (04/17 0800) BP: 89/45 (04/17 1017) Pulse Rate: 80 (04/17 1017)  Labs: Recent Labs    12/25/20 0358 12/25/20 1934 12/26/20 0249 12/26/20 0904 12/26/20 1600 12/27/20 0038 12/27/20 0428 12/27/20 0602  HGB 8.9*  --  8.8*  --   --   --  9.4*  --   HCT 28.4*  --  27.9*  --   --   --  30.5*  --   PLT 246  --  238  --   --   --  228  --   APTT  --   --   --   --   --   --   --  75*  LABPROT  --   --   --   --   --   --   --  16.1*  INR  --   --   --   --   --   --   --  1.3*  HEPARINUNFRC  --    < > 0.21*   < > 0.16* 0.38 0.31  --   CREATININE 1.95*  --  1.75*  --   --   --  1.71*  --    < > = values in this interval not displayed.    Estimated Creatinine Clearance: 67.1 mL/min (A) (by C-G formula based on SCr of 1.71 mg/dL (H)).  Assessment: 60 year old male admitted for hypoxemic respiratory failure and septic shock 2/2 E. Coli bacteremia and UTI, and ATN. Now with Left IJ and subclavian non-occlusive clots.  Pharmacy consulted to start heparin drip. Heparin dosing weight of 113 kg.   Heparin level therapeutic (0.31) on gtt at 2900 units/hr. No bleeding noted.  Goal of Therapy:  Heparin level 0.3-0.7 units/ml Monitor platelets by anticoagulation protocol: Yes   Plan:  Continue heparin at 2900 units/hr Daily HL F/u transition to DOAC now that oral access obtained  41, PharmD, BCPS, BCCCP Clinical Pharmacist Please refer to [facility]  for [facility] Pharmacy numbers 12/27/2020 3:02 PM

## 2020-12-27 NOTE — Progress Notes (Signed)
ANTICOAGULATION CONSULT NOTE - Follow Up Consult  Pharmacy Consult for Heparin Indication: Left IJ and subclavian non-occlusive clots  Allergies  Allergen Reactions  . Ibuprofen Other (See Comments)    Blood in the stool.  . Metformin Other (See Comments)    Tremors, muscles  locking up, unable to control extremities    Patient Measurements: Height: 6\' 2"  (188 cm) Weight: 128.7 kg (283 lb 11.7 oz) IBW/kg (Calculated) : 82.2  Heparin dosing wt: 110 kg  Vital Signs: Temp: 101.4 F (38.6 C) (04/17 0123) Temp Source: Rectal (04/17 0123) BP: 144/83 (04/17 0123) Pulse Rate: 87 (04/17 0100)  Labs: Recent Labs    12/24/20 0152 12/25/20 0358 12/25/20 1934 12/26/20 0249 12/26/20 0904 12/26/20 1600 12/27/20 0038  HGB 9.2* 8.9*  --  8.8*  --   --   --   HCT 29.3* 28.4*  --  27.9*  --   --   --   PLT 257 246  --  238  --   --   --   HEPARINUNFRC  --   --    < > 0.21* 0.17* 0.16* 0.38  CREATININE 2.05* 1.95*  --  1.75*  --   --   --    < > = values in this interval not displayed.    Estimated Creatinine Clearance: 65.6 mL/min (A) (by C-G formula based on SCr of 1.75 mg/dL (H)).  Assessment: 60 year old male admitted for hypoxemic respiratory failure and septic shock 2/2 E. Coli bacteremia and UTI, and ATN. Now with Left IJ and subclavian non-occlusive clots.  Pharmacy consulted to start heparin drip. Heparin dosing weight of 113 kg.   Heparin level therapeutic (0.38) on gtt at 2900 units/hr. No bleeding noted.  Goal of Therapy:  Heparin level 0.3-0.7 units/ml Monitor platelets by anticoagulation protocol: Yes   Plan:  Continue heparin at 2900 units/hr F/u heparin level in a.m. to confirm therapeutic F/u long term [facility] plan and ability to transition to PO vs lovenox  [facility], PharmD, BCPS Please see amion for complete clinical pharmacist phone list 12/27/2020 1:33 AM

## 2020-12-27 NOTE — Evaluation (Signed)
Clinical/Bedside Swallow Evaluation Patient Details  Name: [name] MRN: [id number] Date of Birth: May 06, 1961  Today's Date: 12/27/2020 Time: SLP Start Time (ACUTE ONLY): 0936 SLP Stop Time (ACUTE ONLY): 0949 SLP Time Calculation (min) (ACUTE ONLY): 13 min  Past Medical History: No past medical history on file. Past Surgical History:  Past Surgical History:  Procedure Laterality Date  . CYSTOSCOPY W/ URETERAL STENT PLACEMENT Left 12/16/2020   Procedure: CYSTOSCOPY WITH RETROGRADE PYELOGRAM/URETERAL STENT PLACEMENT;  Surgeon: Alfredo Martinez, MD;  Location: [facility] OR;  Service: Urology;  Laterality: Left;   HPI:  Pt is a 60 year old male who presented 3/31 to the ED with 4-5 day hx of diarrhea and vomiting. He developed confusion and became belligerent at home. Initial ED evaluation found him to be hypotensive, combative, confused, and he was intubated.  He was noted to have mottling over his trunk and extremities and was  admitted for hypoxemic respiratory failure and septic shock 2/2 E. Coli bacteremia and UTI, and ATN. ETT 3/31-4/5; reintubated 4/5-4/16. 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 CXR 4/14: Worsening pneumonia in both lungs, most confluent at the RIGHT  lung base.   Assessment / Plan / Recommendation Clinical Impression  Pt was seen for bedside swallow evaluation. He was lethargic during the evaluation and demonstrated difficulty maintaining an adequate level of alertness. The impact of this on his performance is considered. Pt was confused and in soft wrist restraints throughout the evaluation. Pt was unable to participate in a  complete oral mechanism exam due to his difficulty following commands. Dentition was adequate, with some natural dentition in poor condition and with partial dentures. Pt demonstrated aphonia which raises suspicion for vocal fold insufficiency secondary to prolonged 16-day  intubation and which would increase aspiration risk. Pt was initially resistant to oral care and p.o. trials with frequent shaking of his head to avoid p.o. trials. However, this improved as the evaluation progressed. Pt demonstrated with symptoms of oropharyngeal dysphagia characterized by multiple swallows with trace thin liquids via tsp and reduced bolus awareness with no bolus manipulation of purees and limited manipulation of an ice chip which ultimately fell out of his mouth. Pt demonstrated blowing with thin liquids via straw and bolus awareness was not improved with verbal prompts/tactile cues. It is recommended that the pt's NPO status be maintained. SLP will follow to assess improvement in swallow function. SLP Visit Diagnosis: Dysphagia, unspecified (R13.10)    Aspiration Risk  Moderate aspiration risk    Diet Recommendation NPO   Medication Administration: Via alternative means    Other  Recommendations Oral Care Recommendations: Oral care QID;Staff/trained caregiver to provide oral care   Follow up Recommendations  (TBD)      Frequency and Duration min 2x/week  2 weeks       Prognosis Prognosis for Safe Diet Advancement: Good Barriers to Reach Goals: Severity of deficits      Swallow Study   General Date of Onset: 12/26/20 HPI: Pt is a 60 year old male who presented 3/31 to the ED with 4-5 day hx of diarrhea and vomiting. He developed confusion and became belligerent at home. Initial ED evaluation found him to be hypotensive, combative, confused, and he was intubated.  He was noted to have mottling over his trunk and extremities and was  admitted for hypoxemic respiratory failure and septic shock 2/2 E. Coli bacteremia and UTI, and ATN. ETT 3/31-4/5; reintubated 4/5-4/16. 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 CXR 4/14: Worsening pneumonia in both lungs, most confluent at the RIGHT  lung  base. Type of Study: Bedside Swallow Evaluation Previous Swallow Assessment: none Diet Prior to this Study: NPO Temperature Spikes Noted: No Respiratory Status: Nasal cannula History of Recent Intubation: Yes Length of Intubations (days): 16 days Date extubated: 12/26/20 Behavior/Cognition: Confused;Uncooperative;Requires cueing;Doesn't follow directions;Lethargic/Drowsy Oral Cavity Assessment: Dry Oral Care Completed by SLP: Yes Oral Cavity - Dentition: Adequate natural dentition;Poor condition (with partial dentures) Vision:  (UTA) Self-Feeding Abilities: Total assist Patient Positioning: Upright in bed;Postural control adequate for testing Baseline Vocal Quality: Low vocal intensity;Aphonic Volitional Cough: Cognitively unable to elicit Volitional Swallow: Unable to elicit    Oral/Motor/Sensory Function Overall Oral Motor/Sensory Function:  (UTA)   Ice Chips Ice chips: Impaired Presentation: Spoon Oral Phase Impairments: Poor awareness of bolus   Thin Liquid Thin Liquid: Impaired Presentation: Spoon Oral Phase Impairments: Poor awareness of bolus;Reduced lingual movement/coordination    Nectar Thick Nectar Thick Liquid: Not tested   Honey Thick Honey Thick Liquid: Not tested   Puree Puree: Impaired Presentation: Spoon Oral Phase Impairments: Reduced lingual movement/coordination;Poor awareness of bolus   Solid     Solid: Not tested     Tonyetta Berko I. Vear Clock, MS, CCC-SLP Acute Rehabilitation Services Office number [phone] Pager [phone]  Scheryl Marten 12/27/2020,10:04 AM

## 2020-12-27 NOTE — Progress Notes (Signed)
NAME:  [name], MRN:  [id number], DOB:  Nov 17, 1960, LOS: 65 ADMISSION DATE:  12/10/2020,   History of Present Illness:  60 year old male who presented 3/31 to the ER from home with 4-5 day hx of diarrhea and vomiting.  He developed confusion and became belligerent at home. Initial ER evaluation found him to be hypotensive, combative, confused, and was intubated.  He was noted to have mottling over his trunk and extremities.  Admitted for hypoxemic respiratory failure and septic shock 2/2 E. Coli bacteremia and UTI, and ATN.   Pertinent  Medical History  Prior smoker quit 15 years ago 82-100-pack-year history Sinusitis  Chronic pain syndrome Hypertension Hyperlipidemia Chronic back pain  Significant Hospital Events: Including procedures, antibiotic start and stop dates in addition to other pertinent events   . 03/31 patient was intubated . 03/31 triple-lumen catheter inserted . 4/1 off pressors. E coli bacteremia, e coli uti. Some improvement in pulm opacities on CXR. On rocephin. TTE with LVEF 40-45%, LV global hypokinesis, G2DD, normal RV, dilated aortic root at 53m.   . 4/5 extubated, failed immediately due to mucous plugging identified on bronch after re-intubated. CT ABD with 413mleft ureteral stone with mild edema of left kideny, large areas of consolidation in bilateral lung bases.  . 4/6 broadened to cefepime in light of pneumonia on CT abdomen; urology consulted for distal left ureteral stone, to OR overnight for stent placement, UCx sent.  Versed added for agitation (on fent / precedex) . 4/7 Off vasopressors . 4/11 Vasopressors restarted  . 4/13: Left IJ holiday . 4/15: Right IJ placed  . 4/16: extubated, remained agitated and restless requiring IV Precedex infusion  Interim History / Subjective:  Patient remains vascular and agitated requiring IV Precedex infusion multiple boluses of fentanyl  Objective   Blood pressure (!) 89/45, pulse 80, temperature (!) 100.9 F  (38.3 C), temperature source Rectal, resp. rate (!) 26, height _0  (1.88 m), weight 128.7 kg, SpO2 100 %.        Intake/Output Summary (Last 24 hours) at 12/27/2020 1237 Last data filed at 12/27/2020 0600 Gross per 24 hour  Intake 2783.85 ml  Output 4600 ml  Net -1816.15 ml    Filed Weights   12/25/20 2000 12/26/20 0310 12/27/20 0432  Weight: (!) 139.9 kg 128.7 kg 128.7 kg    Examination:  General: Chronically and critically ill appearing obese middle aged M, restless and agitated in the bed HEENT: Atraumatic, normocephalic, moist mucous membranes, no JVD.  NGT in place Neuro: Awake, alert, agitated, not following commands, plegic left upper extremity PULM: Reduced air entry at bases right more than left otherwise clear GI: Obese soft ndnt hyperactive bowel sounds  Extremities: No acute joint deformity, no cyanosis or clubbing.  Skin: sacral stage II pressure ulcer with adjacent unstageable ulceration, no purulent drainage   Imaging/Labs  4/11 renal USKoreaminimal left hydronephrosis  4/05 Ct abdomen: 50m82mistal ureteral stone with mild edema left kidney  4/01 renal US:[place] 4/12 MRI> 9mm50mbacute/chronic ischemic infarct of R frontal centrum semi ovale. 4/12 echo: limited bubble study, nondiagnostic  4/12 carotid US: [place] mild 1-39% stenosis carotid, unable to evaluate left  4/14 CT abd/pelvis: left ureteral stent in place, distal left stone still present, bibasilar consolidation 4/11: BAL with MSSA  4/15: blood cultures growing MRSE  Resolved Hospital Problem list    Traumatic Urethral Bleeding Septic shock due to E.Coli UTI, E. Coli bacteremia, Pyelonephritis Hypernatremia  Assessment & Plan:  Sepsis with  Recurrent Fevers E. coli bacteremia MSSA Pneumonia  MRSE bacteremia  Patient continued to spike fever, despite completing antibiotic therapy with ceftriaxone and now on IV vancomycin for MRSE bacteremia Continue IV vancomycin Infectious disease is on the  case  Acute hypoxic and hypercarbic respiratory failure 2/2 MSSA Pneumonia Patient was extubated on 4/15, remained agitated and delirious He may have undiagnosed underlying sleep apnea We will place him on BiPAP overnight  Acute kidney injury In setting of sepsis, ATN, pyelonephritis L Uretal stone w/o hydronephrosis s/p ureteral stent placement by Urology 4/6. Repeat US w/mild L hydronephrosis.  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 Echo 4/11 inconclusive, repeat echo today with staph bacteremia.  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 WOC following.   Best practice (right click and "Reselect all SmartList Selections" daily)  Diet: NPO/speech and swallow evaluation Pain/Anxiety/Delirium protocol (if indicated): Precedex infusion with RASS goal -1 VAP protocol (if indicated): N/A DVT prophylaxis: IV heparin infusion GI prophylaxis: PPI Glucose control:  See above Central venous access:  N/A  Arterial line:  N/A Foley:  Yes, and it is still needed   Mobility:  bed rest  PT consulted: N/A Last date of  multidisciplinary goals of care discussion: wife updated 4/17 at bedside.  Code Status:  full code Disposition: ICU  Total critical care time: 35 minutes  Performed by: [place] care time was exclusive of separately billable procedures and treating other patients.   Critical care was necessary to treat or prevent imminent or life-threatening deterioration.   Critical care was time spent personally by me on the following activities: development of treatment plan with patient and/or surrogate as well as nursing, discussions with consultants, evaluation of patient's response to treatment, examination of patient, obtaining history from patient or surrogate, ordering and performing treatments and interventions, ordering and review of laboratory studies, ordering and review of radiographic studies, pulse oximetry and re-evaluation of patient's condition.   Jacky Kindle MD [facility] See Amion for pager If no response to pager, please call [phone] until 7pm After 7pm, Please call E-link [phone],

## 2020-12-27 NOTE — Progress Notes (Signed)
RT note Pt is resting well at this time and he is on 6L salter. Pt is not on BIPAP due to restrains and NG tube is in place. Servo is in the room if pt needed. RN is aware. RT will continue to monitor. VSS.

## 2020-12-28 DIAGNOSIS — N12 Tubulo-interstitial nephritis, not specified as acute or chronic: Secondary | ICD-10-CM

## 2020-12-28 DIAGNOSIS — N170 Acute kidney failure with tubular necrosis: Secondary | ICD-10-CM

## 2020-12-28 DIAGNOSIS — G9340 Encephalopathy, unspecified: Secondary | ICD-10-CM

## 2020-12-28 DIAGNOSIS — I809 Phlebitis and thrombophlebitis of unspecified site: Secondary | ICD-10-CM

## 2020-12-28 LAB — BASIC METABOLIC PANEL
Anion gap: 6 (ref 5–15)
BUN: 80 mg/dL — ABNORMAL HIGH (ref 6–20)
CO2: 28 mmol/L (ref 22–32)
Calcium: 8.5 mg/dL — ABNORMAL LOW (ref 8.9–10.3)
Chloride: 125 mmol/L — ABNORMAL HIGH (ref 98–111)
Creatinine, Ser: 1.54 mg/dL — ABNORMAL HIGH (ref 0.61–1.24)
GFR, Estimated: 52 mL/min — ABNORMAL LOW (ref >60)
Glucose, Bld: 178 mg/dL — ABNORMAL HIGH (ref 70–99)
Potassium: 3.5 mmol/L (ref 3.5–5.1)
Sodium: 159 mmol/L — ABNORMAL HIGH (ref 135–145)

## 2020-12-28 LAB — COMPREHENSIVE METABOLIC PANEL
ALT: 18 U/L (ref 0–44)
AST: 33 U/L (ref 15–41)
Albumin: 2 g/dL — ABNORMAL LOW (ref 3.5–5.0)
Alkaline Phosphatase: 55 U/L (ref 38–126)
Anion gap: 5 (ref 5–15)
BUN: 89 mg/dL — ABNORMAL HIGH (ref 6–20)
CO2: 30 mmol/L (ref 22–32)
Calcium: 8.5 mg/dL — ABNORMAL LOW (ref 8.9–10.3)
Chloride: 119 mmol/L — ABNORMAL HIGH (ref 98–111)
Creatinine, Ser: 1.79 mg/dL — ABNORMAL HIGH (ref 0.61–1.24)
GFR, Estimated: 43 mL/min — ABNORMAL LOW (ref >60)
Glucose, Bld: 261 mg/dL — ABNORMAL HIGH (ref 70–99)
Potassium: 2.9 mmol/L — ABNORMAL LOW (ref 3.5–5.1)
Sodium: 154 mmol/L — ABNORMAL HIGH (ref 135–145)
Total Bilirubin: 0.7 mg/dL (ref 0.3–1.2)
Total Protein: 7.6 g/dL (ref 6.5–8.1)

## 2020-12-28 LAB — CBC
HCT: 30.5 % — ABNORMAL LOW (ref 39.0–52.0)
Hemoglobin: 9.2 g/dL — ABNORMAL LOW (ref 13.0–17.0)
MCH: 31 pg (ref 26.0–34.0)
MCHC: 30.2 g/dL (ref 30.0–36.0)
MCV: 102.7 fL — ABNORMAL HIGH (ref 80.0–100.0)
Platelets: 233 10*3/uL (ref 150–400)
RBC: 2.97 MIL/uL — ABNORMAL LOW (ref 4.22–5.81)
RDW: 14 % (ref 11.5–15.5)
WBC: 8.1 10*3/uL (ref 4.0–10.5)
nRBC: 0 % (ref 0.0–0.2)

## 2020-12-28 LAB — GLUCOSE, CAPILLARY
Glucose-Capillary: 214 mg/dL — ABNORMAL HIGH (ref 70–99)
Glucose-Capillary: 206 mg/dL — ABNORMAL HIGH (ref 70–99)
Glucose-Capillary: 154 mg/dL — ABNORMAL HIGH (ref 70–99)
Glucose-Capillary: 132 mg/dL — ABNORMAL HIGH (ref 70–99)
Glucose-Capillary: 215 mg/dL — ABNORMAL HIGH (ref 70–99)
Glucose-Capillary: 185 mg/dL — ABNORMAL HIGH (ref 70–99)

## 2020-12-28 LAB — MAGNESIUM: Magnesium: 2.8 mg/dL — ABNORMAL HIGH (ref 1.7–2.4)

## 2020-12-28 LAB — HEPARIN LEVEL (UNFRACTIONATED): Heparin Unfractionated: 0.34 IU/mL (ref 0.30–0.70)

## 2020-12-28 LAB — VANCOMYCIN, TROUGH: Vancomycin Tr: 19 ug/mL (ref 15–20)

## 2020-12-28 LAB — PATHOLOGIST SMEAR REVIEW

## 2020-12-28 LAB — AMMONIA: Ammonia: 26 umol/L (ref 9–35)

## 2020-12-28 MED ORDER — POTASSIUM CHLORIDE 10 MEQ/50ML IV SOLN
10.0000 meq | INTRAVENOUS | Status: AC
  Administered 2020-12-28 (×4): 10 meq via INTRAVENOUS
  Filled 2020-12-28 (×4): qty 50

## 2020-12-28 MED ORDER — VANCOMYCIN HCL 1000 MG/200ML IV SOLN
1000.0000 mg | INTRAVENOUS | Status: DC
  Administered 2020-12-29 – 2020-12-31 (×3): 1000 mg via INTRAVENOUS
  Filled 2020-12-28 (×3): qty 200

## 2020-12-28 MED ORDER — PANTOPRAZOLE SODIUM 40 MG PO PACK: 40.0000 mg | PACK | Freq: Every day | ORAL | Status: DC

## 2020-12-28 MED ORDER — POTASSIUM CHLORIDE 20 MEQ PO PACK
40.0000 meq | PACK | Freq: Once | ORAL | Status: AC
Start: 2020-12-28 — End: 2020-12-28
  Administered 2020-12-28: 40 meq
  Filled 2020-12-28: qty 2

## 2020-12-28 MED ORDER — ALBUMIN HUMAN 5 % IV SOLN
12.5000 g | Freq: Once | INTRAVENOUS | Status: AC
  Administered 2020-12-28: 12.5 g via INTRAVENOUS
  Filled 2020-12-28: qty 250

## 2020-12-28 MED ORDER — CHLORHEXIDINE GLUCONATE 0.12 % MT SOLN
15.0000 mL | Freq: Two times a day (BID) | OROMUCOSAL | Status: DC
  Administered 2020-12-28 – 2021-02-02 (×65): 15 mL via OROMUCOSAL
  Filled 2020-12-28 (×60): qty 15

## 2020-12-28 MED ORDER — ORAL CARE MOUTH RINSE
15.0000 mL | Freq: Two times a day (BID) | OROMUCOSAL | Status: DC
  Administered 2020-12-29 – 2021-01-27 (×51): 15 mL via OROMUCOSAL

## 2020-12-28 MED ORDER — OXYCODONE HCL 5 MG/5ML PO SOLN
5.0000 mg | Freq: Four times a day (QID) | ORAL | Status: DC
  Administered 2020-12-28 – 2021-01-05 (×33): 5 mg
  Filled 2020-12-28 (×34): qty 5

## 2020-12-28 MED ORDER — INSULIN DETEMIR 100 UNIT/ML ~~LOC~~ SOLN
10.0000 [IU] | Freq: Once | SUBCUTANEOUS | Status: AC
  Administered 2020-12-28: 10 [IU] via SUBCUTANEOUS
  Filled 2020-12-28: qty 0.1

## 2020-12-28 MED ORDER — FREE WATER
400.0000 mL | Status: DC
  Administered 2020-12-28 – 2020-12-30 (×12): 400 mL

## 2020-12-28 MED ORDER — OLANZAPINE 10 MG PO TABS
10.0000 mg | ORAL_TABLET | Freq: Every day | ORAL | Status: DC
  Administered 2020-12-28 – 2020-12-30 (×3): 10 mg
  Filled 2020-12-28 (×3): qty 1

## 2020-12-28 MED ORDER — INSULIN DETEMIR 100 UNIT/ML ~~LOC~~ SOLN
35.0000 [IU] | Freq: Two times a day (BID) | SUBCUTANEOUS | Status: DC
  Administered 2020-12-28 – 2021-01-25 (×56): 35 [IU] via SUBCUTANEOUS
  Administered 2021-01-26: 30 [IU] via SUBCUTANEOUS
  Filled 2020-12-28 (×64): qty 0.35

## 2020-12-28 NOTE — Progress Notes (Signed)
K 2.9 Replaced per protocol 

## 2020-12-28 NOTE — Progress Notes (Signed)
Inpatient Diabetes Program Recommendations  AACE/ADA: New Consensus Statement on Inpatient Glycemic Control (2015)  Target Ranges:  Prepandial:   less than 140 mg/dL      Peak postprandial:   less than 180 mg/dL (1-2 hours)      Critically ill patients:  140 - 180 mg/dL   Lab Results  Component Value Date   GLUCAP 206 (H) 12/28/2020   HGBA1C 6.8 (H) 12/21/2020    Review of Glycemic Control Results for GERTRUDE, TARBET (MRN [id number]) as of 12/28/2020 10:30  Ref. Range 12/27/2020 15:49 12/27/2020 19:13 12/27/2020 22:54 12/28/2020 03:08 12/28/2020 07:22  Glucose-Capillary Latest Ref Range: 70 - 99 mg/dL 329 (H) 191 (H) 660 (H) 214 (H) 206 (H)   Inpatient Diabetes Program Recommendations:   If CBGs remain elevated, please consider: -Increase Novolog tube feed coverage to 8 units q 4 hrs.  Thank you, Billy Fischer. Genae Strine, RN, MSN, CDE  Diabetes Coordinator Inpatient Glycemic Control Team Team Pager [phone] (8am-5pm) 12/28/2020 10:31 AM

## 2020-12-28 NOTE — Procedures (Signed)
Cortrak  Person Inserting Tube:  Khiry Pasquariello, RD Tube Type:  Cortrak - 43 inches Tube Location:  Right nare Initial Placement:  Stomach Secured by: Bridle Technique Used to Measure Tube Placement:  Documented cm marking at nare/ corner of mouth Cortrak Secured At:  64 cm   No x-ray is required. RN may begin using tube.   If the tube becomes dislodged please keep the tube and contact the Cortrak team at [email or web address] (password [email or web address]) for replacement.  If after hours and replacement cannot be delayed, place a NG tube and confirm placement with an abdominal x-ray.    Benna Arno RD, LDN Clinical Nutrition Pager listed in AMION    

## 2020-12-28 NOTE — Progress Notes (Signed)
Physical Therapy Wound Treatment Patient Details  Name: [name] MRN: [id number] Date of Birth: Jun 29, 1961  Today's Date: 12/28/2020 Time: 1022-1053 Time Calculation (min): 31 min  Subjective  Subjective Assessment Subjective: Pt mildly restless Patient and Family Stated Goals: None stated Date of Onset:  (Unknown) Prior Treatments:  (Dressing changes)  Pain Score:  2/10  Wound Assessment  Pressure Injury 12/21/20 Right Deep Tissue Pressure Injury - Purple or maroon localized area of discolored intact skin or blood-filled blister due to damage of underlying soft tissue from pressure and/or shear. purple with blister area that has torn (Active)  Dressing Type ABD;Barrier Film (skin prep);Gauze (Comment);Moist to dry 12/28/20 1311  Dressing Changed;Clean;Dry;Intact 12/28/20 1311  Dressing Change Frequency Daily 12/28/20 1311  State of Healing Eschar 12/28/20 1311  Site / Wound Assessment Black;Red;Yellow 12/28/20 1311  % Wound base Red or Granulating 15% 12/28/20 1311  % Wound base Yellow/Fibrinous Exudate 5% 12/28/20 1311  % Wound base Black/Eschar 80% 12/28/20 1311  % Wound base Other/Granulation Tissue (Comment) 0% 12/28/20 1311  Peri-wound Assessment Bleeding;Maceration 12/28/20 1311  Wound Length (cm) 10.3 cm 12/25/20 1100  Wound Width (cm) 5 cm 12/25/20 1100  Wound Depth (cm) 0.1 cm 12/25/20 1100  Wound Surface Area (cm^2) 51.5 cm^2 12/25/20 1100  Wound Volume (cm^3) 5.15 cm^3 12/25/20 1100  Tunneling (cm) 0 12/25/20 1100  Undermining (cm) 0 12/25/20 1100  Margins Unattached edges (unapproximated) 12/28/20 1311  Drainage Amount Scant 12/28/20 1311  Drainage Description Serous 12/28/20 1311  Treatment Debridement (Selective);Hydrotherapy (Pulse lavage);Packing (Saline gauze) 12/28/20 1311      Hydrotherapy Pulsed lavage therapy - wound location: R buttock Pulsed Lavage with Suction (psi): 16 psi Pulsed Lavage with Suction - Normal Saline Used: 1000 mL Pulsed Lavage  Tip: Tip with splash shield Selective Debridement Selective Debridement - Location: R buttock Selective Debridement - Tools Used: Forceps,Scalpel    Wound Assessment and Plan  Wound Therapy - Assess/Plan/Recommendations Wound Therapy - Clinical Statement: Improved agitation/restlessness, thus allowing sharp debridement to be performed today. Pt will benefit from hydrotherapy to cleanse wound and decrease amount of necrotic tissue. Wound Therapy - Functional Problem List: Global weakness in the setting of critical illness and extended ICU stay. Factors Delaying/Impairing Wound Healing: Immobility,Multiple medical problems,Polypharmacy,Infection - systemic/local Hydrotherapy Plan: Debridement,Dressing change,Patient/family education,Pulsatile lavage with suction Wound Therapy - Frequency: 6X / week Wound Therapy - Follow Up Recommendations: dressing changes by RN  Wound Therapy Goals- Improve the function of patient's integumentary system by progressing the wound(s) through the phases of wound healing (inflammation - proliferation - remodeling) by: Wound Therapy Goals - Improve the function of patient's integumentary system by progressing the wound(s) through the phases of wound healing by: Decrease Necrotic Tissue to: 25% Decrease Necrotic Tissue - Progress: Progressing toward goal Increase Granulation Tissue to: 75% Increase Granulation Tissue - Progress: Progressing toward goal Improve Drainage Characteristics: Min,Serous Improve Drainage Characteristics - Progress: Progressing toward goal Goals/treatment plan/discharge plan were made with and agreed upon by patient/family: No, Patient unable to participate in goals/treatment/discharge plan and family unavailable Time For Goal Achievement: 7 days Wound Therapy - Potential for Goals: Good  Goals will be updated until maximal potential achieved or discharge criteria met.  Discharge criteria: when goals achieved, discharge from hospital, MD  decision/surgical intervention, no progress towards goals, refusal/missing three consecutive treatments without notification or medical reason.  GP     Charges PT Wound Care Charges $Wound Debridement up to 20 cm: < or equal to 20 cm $ Wound Debridement each  add'l 20 sqcm: 2 $PT PLS Gun and Tip: 1 Supply $PT Hydrotherapy Visit: 1 Visit   Wyona Almas, PT, DPT Acute Rehabilitation Services Pager [phone] Office [phone]        Deno Etienne 12/28/2020, 1:15 PM

## 2020-12-28 NOTE — Progress Notes (Addendum)
ANTICOAGULATION& ANTIBIOTIC CONSULT NOTE - Follow Up Consult  Pharmacy Consult for Heparin Indication: Left IJ and subclavian non-occlusive clots  Allergies  Allergen Reactions  . Ibuprofen Other (See Comments)    Blood in the stool.  . Metformin Other (See Comments)    Tremors, muscles  locking up, unable to control extremities    Patient Measurements: Height: 6\' 2"  (188 cm) Weight: 127.8 kg (281 lb 12 oz) IBW/kg (Calculated) : 82.2  Heparin dosing wt: 110 kg  Vital Signs: Temp: 99 F (37.2 C) (04/18 0725) Temp Source: Axillary (04/18 0725) BP: 131/77 (04/18 0900) Pulse Rate: 107 (04/18 0900)  Labs: Recent Labs    12/26/20 0249 12/26/20 0904 12/27/20 0038 12/27/20 0428 12/27/20 0602 12/28/20 0459  HGB 8.8*  --   --  9.4*  --  9.2*  HCT 27.9*  --   --  30.5*  --  30.5*  PLT 238  --   --  228  --  233  APTT  --   --   --   --  75*  --   LABPROT  --   --   --   --  16.1*  --   INR  --   --   --   --  1.3*  --   HEPARINUNFRC 0.21*   < > 0.38 0.31  --  0.34  CREATININE 1.75*  --   --  1.71*  --  1.79*   < > = values in this interval not displayed.    Estimated Creatinine Clearance: 63.9 mL/min (A) (by C-G formula based on SCr of 1.79 mg/dL (H)).  Assessment: 60 year old male admitted for hypoxemic respiratory failure and septic shock 2/2 E. Coli bacteremia and UTI, and ATN. Now with Left IJ and subclavian non-occlusive clots.  Pharmacy consulted to start heparin drip. Heparin dosing weight of 113 kg.   Heparin level 0.34 is therapeutic on heparin 2900 units/hr. Hgb 9.2 - stable. Plt wnl. No noted bleeding.  Pharmacy also consulted to dose vancomycin for MRSE bacteremia and patient has been receiving vancomycin 1500mg  IV q24h. Scr 1.79 stable - unknown baseline. UOP 1 ml/kg/hr. Vancomycin peak 40. Vancomycin trough 19. Calculated AUC 705 is supratherapeutic (goal AUC 400-550).   Goal of Therapy:  Heparin level 0.3-0.7 units/ml Monitor platelets by anticoagulation  protocol: Yes   Plan:  Continue heparin at 2900 units/hr Monitor heparin level, CBC, and s/s of bleeding daily  F/u transition to DOAC  Adjust vancomycin to 1000mg  q24h (eAUC 470) Follow up ID recommendations, blood cultures, and ID recommendations.   41, PharmD Clinical Pharmacist  12/28/2020 9:58 AM

## 2020-12-28 NOTE — Progress Notes (Signed)
SLP Cancellation Note  Patient Details Name: [name] MRN: [id number] DOB: Nov 24, 1960   Cancelled treatment:       Reason Eval/Treat Not Completed: Patient not medically ready. Mentation not appropriate for swallowing therapy today. Requested change to cortrak from NG to increase comfort and ability to participate in swallowing therapy when appropriate.    Amer Alcindor, Riley Nearing 12/28/2020, 9:31 AM

## 2020-12-28 NOTE — Progress Notes (Signed)
[facility] for Infectious Disease    Date of Admission:  12/10/2020   Total days of antibiotics 4  ID: [name] is a 60 y.o. male with complicated hospital course including bacteremia due to ecoli/pyelonephritis subsequent subacute cerebral thrombosis with IJ thrombus in Left IJ/[place] with MRSE bacteremia  Active Problems:   Acute respiratory failure (HCC)   AKI (acute kidney injury) ([facility])   Bacteremia due to Escherichia coli   Ureteral stone   Pyelonephritis   Cerebral thrombosis with cerebral infarction   Staphylococcus epidermidis bacteremia    Subjective: Afebrile, but this morning at 100.28F. he remains confused, still requiring mitts. Answer questions half of the time. -   Medications:  . aspirin  81 mg Per Tube Daily  . atorvastatin  40 mg Per Tube Daily  . chlorhexidine gluconate (MEDLINE KIT)  15 mL Mouth Rinse BID  . Chlorhexidine Gluconate Cloth  6 each Topical Daily  . collagenase   Topical Daily  . docusate  50 mg Per Tube Daily  . feeding supplement (PROSource TF)  90 mL Per Tube TID  . free water  400 mL Per Tube Q4H  . insulin aspart  0-15 Units Subcutaneous Q4H  . insulin aspart  6 Units Subcutaneous Q4H  . insulin detemir  35 Units Subcutaneous BID  . mouth rinse  15 mL Mouth Rinse 10 times per day  . OLANZapine  10 mg Per Tube Daily  . oxyCODONE  5 mg Per Tube Q6H  . polyethylene glycol  17 g Per Tube Daily  . sodium chloride flush  10-40 mL Intracatheter Q12H  . vitamin B-12  100 mcg Per Tube Daily    Objective: Vital signs in last 24 hours: Temp:  [98.4 F (36.9 C)-100.4 F (38 C)] 99.6 F (37.6 C) (04/18 1118) Pulse Rate:  [60-117] 117 (04/18 1200) Resp:  [18-33] 30 (04/18 1200) BP: (96-143)/(43-94) 143/90 (04/18 1200) SpO2:  [94 %-98 %] 94 % (04/18 1200) FiO2 (%):  [50 %] 50 % (04/18 0922) Weight:  [127.8 kg] 127.8 kg (04/18 0345) Physical Exam  Constitutional: He is oriented to person, only. He appears well-developed and  well-nourished. No distress.  HENT:  Mouth/Throat: Oropharynx is clear and moist. No oropharyngeal exudate.  Cardiovascular: Normal rate, regular rhythm and normal heart sounds. Exam reveals no gallop and no friction rub.  No murmur heard.  Pulmonary/Chest: Effort normal and breath sounds normal. No respiratory distress. He has no wheezes.  Abdominal: Soft. Bowel sounds are normal. He exhibits no distension. There is no tenderness. Rectal tube in place Neurological: He is alert and oriented to person, only. Moves all appendages except left upper extremity Skin: Skin is warm and dry. No rash noted. No erythema.  Psychiatric: appears confused, not answering questions consistently    Lab Results Recent Labs    12/27/20 0428 12/28/20 0459  WBC 7.6 8.1  HGB 9.4* 9.2*  HCT 30.5* 30.5*  NA 155* 154*  K 3.6 2.9*  CL 113* 119*  CO2 33* 30  BUN 72* 89*  CREATININE 1.71* 1.79*   Liver Panel Recent Labs    12/26/20 0249 12/28/20 0459  PROT 7.0 7.6  ALBUMIN 2.0* 2.0*  AST 29 33  ALT 16 18  ALKPHOS 56 55  BILITOT 0.7 0.7    Microbiology: 4/15 blood cx MRSE (blood cx from 4/13) 4/16 blood cx NGTD at 48hrs Studies/Results: DG Abd 1 View  Result Date: 12/27/2020 CLINICAL DATA:  Nasogastric tube placement EXAM: ABDOMEN -  1 VIEW COMPARISON:  12/15/2020 FINDINGS: Nasogastric tube appears looped within the stomach with its tip in the region of the gastric cardia. The abdomen is largely excluded from view. Central venous catheter tip noted within the superior vena cava. Minimal left basilar atelectasis. IMPRESSION: Nasogastric tube looped within the stomach with its tip in the region of the gastric cardia. Electronically Signed   By: Fidela Salisbury MD   On: 12/27/2020 15:59     Assessment/Plan: MRSE bacteremia with associated Left IJ/[place] thrombus = plan to treat as septic thrombophlebitis with 6 wk of iv therapy. Suspect this was associated with previous L IJ line. Currently on  vancomycin. Will dose adjust with his kidney function, if unable to tolerate, will switch to daptomycin  Acute kidney disease= with recent ATN/pyelonephritis = had ureteral stent placement. Continue to monitor cr and vanco levels to adjust accordingly.  subacute infarct to right frontal centrum regin = continue on aspirin,statin.   Encephalopathy = likely multifactorial with prolonged hospitalization. Also primary team is titrating precedex as well as discontinuing klonopin.  [facility]ious Diseases Cell: [phone] Pager: [phone]  12/28/2020, 3:00 PM

## 2020-12-28 NOTE — Progress Notes (Signed)
NAME:  Vonzell Lindblad, MRN:  [id number], DOB:  June 26, 1961, LOS: 71 ADMISSION DATE:  12/10/2020,   History of Present Illness:  60 year old male who presented 3/31 to the ER from home with 4-5 day hx of diarrhea and vomiting.  He developed confusion and became belligerent at home. Initial ER evaluation found him to be hypotensive, combative, confused, and was intubated.  He was noted to have mottling over his trunk and extremities.  Admitted for hypoxemic respiratory failure and septic shock 2/2 E. Coli bacteremia and UTI, and ATN.   Pertinent  Medical History  Prior smoker quit 15 years ago 82-100-pack-year history Sinusitis  Chronic pain syndrome Hypertension Hyperlipidemia Chronic back pain  Significant Hospital Events: Including procedures, antibiotic start and stop dates in addition to other pertinent events   . 03/31 patient was intubated . 03/31 triple-lumen catheter inserted . 4/1 off pressors. E coli bacteremia, e coli uti. Some improvement in pulm opacities on CXR. On rocephin. TTE with LVEF 40-45%, LV global hypokinesis, G2DD, normal RV, dilated aortic root at 90m.   . 4/5 extubated, failed immediately due to mucous plugging identified on bronch after re-intubated. CT ABD with 490mleft ureteral stone with mild edema of left kideny, large areas of consolidation in bilateral lung bases.  . 4/6 broadened to cefepime in light of pneumonia on CT abdomen; urology consulted for distal left ureteral stone, to OR overnight for stent placement, UCx sent.  Versed added for agitation (on fent / precedex) . 4/7 Off vasopressors . 4/11 Vasopressors restarted  . 4/13: Left IJ holiday . 4/15: Right IJ placed  . 4/16: extubated, remained agitated and restless requiring IV Precedex infusion  Interim History / Subjective:  Remains on Precedex for agitation.   Objective   Blood pressure 116/63, pulse 93, temperature 99 F (37.2 C), temperature source Axillary, resp. rate (!) 27, height _0   (1.88 m), weight 127.8 kg, SpO2 97 %.        Intake/Output Summary (Last 24 hours) at 12/28/2020 0837 Last data filed at 12/28/2020 061540ross per 24 hour  Intake 4540.87 ml  Output 2800 ml  Net 1740.87 ml    Filed Weights   12/26/20 0310 12/27/20 0432 12/28/20 0345  Weight: 128.7 kg 128.7 kg 127.8 kg    Examination:  General: Obese adult male, lying in bed  HEENT: NG in place, Dry MM  Neuro: Opens eyes with stimulation, does not follow commands, moves extremities spontaneously, no movement noted to LUE  PULM: Reduced air entry at bases right more than left otherwise clear GI: Obese soft ndnt hyperactive bowel sounds  Extremities: No acute joint deformity, no cyanosis or clubbing.  Skin: sacral stage II pressure ulcer with adjacent unstageable ulceration, no purulent drainage   Imaging/Labs  4/11 renal USKoreaminimal left hydronephrosis  4/05 Ct abdomen: 57m61mistal ureteral stone with mild edema left kidney  4/01 renal US:[place] 4/12 MRI> 9mm76mbacute/chronic ischemic infarct of R frontal centrum semi ovale. 4/12 echo: limited bubble study, nondiagnostic  4/12 carotid US: [place] mild 1-39% stenosis carotid, unable to evaluate left  4/14 CT abd/pelvis: left ureteral stent in place, distal left stone still present, bibasilar consolidation 4/11: BAL with MSSA  4/15: blood cultures growing MRSE  Resolved Hospital Problem list   Traumatic Urethral Bleeding Septic shock due to E.Coli UTI, E. Coli bacteremia, Pyelonephritis Hypernatremia  Assessment & Plan:   Sepsis with Recurrent Fevers MSSA Pneumonia/MRSE Bacteremia  -S/P Treatment for E.Coli Bacteremia -ECHO with no evidence of  vegetation  Plan -ID Following, Continue IV vancomycin for 4-6 weeks given clot on left treating for endovascular infection  -Trend WBC and Fever Curve   Acute hypoxic and hypercarbic respiratory failure 2/2 MSSA Pneumonia Extubated on 4/15 Question Underlying OSA Plan -Continue to titrate  supplemental oxygen for saturation goal >92 (currently on 6L [place]) -BiPAP at HS   Acute kidney injury secondary to sepsis, ATN, pyelonephritis L Uretal stone w/o hydronephrosis s/p ureteral stent placement 4/6. Repeat US w/mild L hydronephrosis.  Hypernatremia Hypokalemia  Plan -Trend BMP > Replacing K this AM -Continue to hold further diuresis  -Increase free water to 400 ml q4h  Acute delirium Chronic Pain  Plan -Titrate Precedex for RASS goal 0/-1 -D/C Klonopin. Start Zyrexa 10 mg Daily  -Decrease oxycodone to 5 mg q6h  Subacute vs chronic infarct to Posterior R frontal centrum semi ovale -- noted on MRI 4/12 after pt had acute LUE weakness. 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 Remains on vasopressors.   Foley:  Yes, and it is still needed   Mobility:  bed rest  PT consulted: N/A Last date of multidisciplinary goals of care discussion: will update family Code Status:  full code Disposition: ICU  Total critical care time: 32 minutes  Critical care time was exclusive of  separately billable procedures and treating other patients.   Critical care was necessary to treat or prevent imminent or life-threatening deterioration.   Critical care was time spent personally by me on the following activities: development of treatment plan with patient and/or surrogate as well as nursing, discussions with consultants, evaluation of patient's response to treatment, examination of patient, obtaining history from patient or surrogate, ordering and performing treatments and interventions, ordering and review of laboratory studies, ordering and review of radiographic studies, pulse oximetry and re-evaluation of patient's condition.   Hayden Pedro, AGACNP-BC [facility]  See Amion for pager If no response to pager, please call [phone] until 7pm After 7pm, Please call E-link [phone],

## 2020-12-29 ENCOUNTER — Inpatient Hospital Stay: Payer: Self-pay

## 2020-12-29 ENCOUNTER — Inpatient Hospital Stay (HOSPITAL_COMMUNITY): Payer: Medicaid Other

## 2020-12-29 DIAGNOSIS — L89329 Pressure ulcer of left buttock, unspecified stage: Secondary | ICD-10-CM

## 2020-12-29 DIAGNOSIS — B9562 Methicillin resistant Staphylococcus aureus infection as the cause of diseases classified elsewhere: Secondary | ICD-10-CM

## 2020-12-29 DIAGNOSIS — R509 Fever, unspecified: Secondary | ICD-10-CM

## 2020-12-29 LAB — BASIC METABOLIC PANEL
Anion gap: 7 (ref 5–15)
Anion gap: 6 (ref 5–15)
BUN: 76 mg/dL — ABNORMAL HIGH (ref 6–20)
BUN: 68 mg/dL — ABNORMAL HIGH (ref 6–20)
CO2: 28 mmol/L (ref 22–32)
CO2: 27 mmol/L (ref 22–32)
Calcium: 8.5 mg/dL — ABNORMAL LOW (ref 8.9–10.3)
Calcium: 8.4 mg/dL — ABNORMAL LOW (ref 8.9–10.3)
Chloride: 123 mmol/L — ABNORMAL HIGH (ref 98–111)
Chloride: 122 mmol/L — ABNORMAL HIGH (ref 98–111)
Creatinine, Ser: 1.5 mg/dL — ABNORMAL HIGH (ref 0.61–1.24)
Creatinine, Ser: 1.52 mg/dL — ABNORMAL HIGH (ref 0.61–1.24)
GFR, Estimated: 54 mL/min — ABNORMAL LOW (ref >60)
GFR, Estimated: 53 mL/min — ABNORMAL LOW (ref >60)
Glucose, Bld: 176 mg/dL — ABNORMAL HIGH (ref 70–99)
Glucose, Bld: 158 mg/dL — ABNORMAL HIGH (ref 70–99)
Potassium: 3.5 mmol/L (ref 3.5–5.1)
Potassium: 3.4 mmol/L — ABNORMAL LOW (ref 3.5–5.1)
Sodium: 158 mmol/L — ABNORMAL HIGH (ref 135–145)
Sodium: 155 mmol/L — ABNORMAL HIGH (ref 135–145)

## 2020-12-29 LAB — GLUCOSE, CAPILLARY
Glucose-Capillary: 154 mg/dL — ABNORMAL HIGH (ref 70–99)
Glucose-Capillary: 155 mg/dL — ABNORMAL HIGH (ref 70–99)
Glucose-Capillary: 168 mg/dL — ABNORMAL HIGH (ref 70–99)
Glucose-Capillary: 107 mg/dL — ABNORMAL HIGH (ref 70–99)
Glucose-Capillary: 201 mg/dL — ABNORMAL HIGH (ref 70–99)
Glucose-Capillary: 186 mg/dL — ABNORMAL HIGH (ref 70–99)

## 2020-12-29 LAB — POCT I-STAT 7, (LYTES, BLD GAS, ICA,H+H)
Acid-Base Excess: 4 mmol/L — ABNORMAL HIGH (ref 0.0–2.0)
Bicarbonate: 28.7 mmol/L — ABNORMAL HIGH (ref 20.0–28.0)
Calcium, Ion: 1.22 mmol/L (ref 1.15–1.40)
HCT: 23 % — ABNORMAL LOW (ref 39.0–52.0)
Hemoglobin: 7.8 g/dL — ABNORMAL LOW (ref 13.0–17.0)
O2 Saturation: 97 %
Patient temperature: 99.4
Potassium: 3.7 mmol/L (ref 3.5–5.1)
Sodium: 159 mmol/L — ABNORMAL HIGH (ref 135–145)
TCO2: 30 mmol/L (ref 22–32)
pCO2 arterial: 43 mmHg (ref 32.0–48.0)
pH, Arterial: 7.434 (ref 7.350–7.450)
pO2, Arterial: 88 mmHg (ref 83.0–108.0)

## 2020-12-29 LAB — HEPARIN LEVEL (UNFRACTIONATED)
Heparin Unfractionated: 0.27 IU/mL — ABNORMAL LOW (ref 0.30–0.70)
Heparin Unfractionated: 0.32 IU/mL (ref 0.30–0.70)
Heparin Unfractionated: 0.31 IU/mL (ref 0.30–0.70)

## 2020-12-29 LAB — CBC
HCT: 29.8 % — ABNORMAL LOW (ref 39.0–52.0)
Hemoglobin: 8.9 g/dL — ABNORMAL LOW (ref 13.0–17.0)
MCH: 31.3 pg (ref 26.0–34.0)
MCHC: 29.9 g/dL — ABNORMAL LOW (ref 30.0–36.0)
MCV: 104.9 fL — ABNORMAL HIGH (ref 80.0–100.0)
Platelets: 217 10*3/uL (ref 150–400)
RBC: 2.84 MIL/uL — ABNORMAL LOW (ref 4.22–5.81)
RDW: 14.4 % (ref 11.5–15.5)
WBC: 8 10*3/uL (ref 4.0–10.5)
nRBC: 0 % (ref 0.0–0.2)

## 2020-12-29 LAB — PHOSPHORUS: Phosphorus: 2.6 mg/dL (ref 2.5–4.6)

## 2020-12-29 LAB — MAGNESIUM: Magnesium: 2.7 mg/dL — ABNORMAL HIGH (ref 1.7–2.4)

## 2020-12-29 MED ORDER — DEXTROSE 5 % IV SOLN: INTRAVENOUS | Status: DC

## 2020-12-29 MED ORDER — SODIUM CHLORIDE 0.9 % IV SOLN
INTRAVENOUS | Status: DC | PRN
  Administered 2020-12-31: 500 mL via INTRAVENOUS
  Administered 2021-01-19: 50 mL via INTRAVENOUS

## 2020-12-29 MED ORDER — INSULIN ASPART 100 UNIT/ML ~~LOC~~ SOLN
8.0000 [IU] | SUBCUTANEOUS | Status: DC
  Administered 2020-12-29 – 2021-01-13 (×79): 8 [IU] via SUBCUTANEOUS

## 2020-12-29 MED ORDER — POTASSIUM CHLORIDE 20 MEQ PO PACK
40.0000 meq | PACK | Freq: Once | ORAL | Status: AC
  Administered 2020-12-29: 40 meq
  Filled 2020-12-29: qty 2

## 2020-12-29 MED ORDER — SODIUM CHLORIDE 0.9% FLUSH
10.0000 mL | INTRAVENOUS | Status: DC | PRN
  Administered 2020-12-31: 10 mL

## 2020-12-29 MED ORDER — PROSOURCE TF PO LIQD
90.0000 mL | Freq: Four times a day (QID) | ORAL | Status: DC
  Administered 2020-12-29 – 2021-01-13 (×59): 90 mL
  Filled 2020-12-29 (×49): qty 90

## 2020-12-29 MED ORDER — DEXMEDETOMIDINE HCL IN NACL 400 MCG/100ML IV SOLN: 0.2000 ug/kg/h | INTRAVENOUS | Status: DC

## 2020-12-29 MED ORDER — INSULIN ASPART 100 UNIT/ML ~~LOC~~ SOLN
0.0000 [IU] | SUBCUTANEOUS | Status: DC
  Administered 2020-12-29: 4 [IU] via SUBCUTANEOUS
  Administered 2020-12-29: 7 [IU] via SUBCUTANEOUS
  Administered 2020-12-30 (×2): 3 [IU] via SUBCUTANEOUS
  Administered 2020-12-30 (×2): 4 [IU] via SUBCUTANEOUS
  Administered 2020-12-30 (×2): 3 [IU] via SUBCUTANEOUS
  Administered 2020-12-30: 4 [IU] via SUBCUTANEOUS
  Administered 2020-12-31 (×2): 3 [IU] via SUBCUTANEOUS
  Administered 2020-12-31: 4 [IU] via SUBCUTANEOUS
  Administered 2021-01-01: 3 [IU] via SUBCUTANEOUS
  Administered 2021-01-01: 4 [IU] via SUBCUTANEOUS
  Administered 2021-01-01 (×3): 3 [IU] via SUBCUTANEOUS
  Administered 2021-01-02: 4 [IU] via SUBCUTANEOUS
  Administered 2021-01-02: 3 [IU] via SUBCUTANEOUS
  Administered 2021-01-02: 4 [IU] via SUBCUTANEOUS
  Administered 2021-01-02 (×3): 3 [IU] via SUBCUTANEOUS
  Administered 2021-01-02 – 2021-01-03 (×4): 4 [IU] via SUBCUTANEOUS
  Administered 2021-01-03: 3 [IU] via SUBCUTANEOUS
  Administered 2021-01-03 (×2): 4 [IU] via SUBCUTANEOUS
  Administered 2021-01-04: 3 [IU] via SUBCUTANEOUS
  Administered 2021-01-04: 4 [IU] via SUBCUTANEOUS
  Administered 2021-01-04: 3 [IU] via SUBCUTANEOUS
  Administered 2021-01-04: 7 [IU] via SUBCUTANEOUS
  Administered 2021-01-05 (×3): 3 [IU] via SUBCUTANEOUS
  Administered 2021-01-05: 4 [IU] via SUBCUTANEOUS
  Administered 2021-01-06 (×3): 3 [IU] via SUBCUTANEOUS
  Administered 2021-01-06 (×3): 4 [IU] via SUBCUTANEOUS
  Administered 2021-01-06: 3 [IU] via SUBCUTANEOUS
  Administered 2021-01-07: 4 [IU] via SUBCUTANEOUS
  Administered 2021-01-07: 3 [IU] via SUBCUTANEOUS
  Administered 2021-01-07 (×2): 4 [IU] via SUBCUTANEOUS
  Administered 2021-01-07 – 2021-01-08 (×4): 3 [IU] via SUBCUTANEOUS
  Administered 2021-01-08: 7 [IU] via SUBCUTANEOUS
  Administered 2021-01-09: 4 [IU] via SUBCUTANEOUS
  Administered 2021-01-09: 3 [IU] via SUBCUTANEOUS
  Administered 2021-01-09 (×2): 4 [IU] via SUBCUTANEOUS
  Administered 2021-01-09: 3 [IU] via SUBCUTANEOUS
  Administered 2021-01-10 (×2): 4 [IU] via SUBCUTANEOUS
  Administered 2021-01-10: 3 [IU] via SUBCUTANEOUS
  Administered 2021-01-10: 4 [IU] via SUBCUTANEOUS
  Administered 2021-01-10: 3 [IU] via SUBCUTANEOUS
  Administered 2021-01-10: 4 [IU] via SUBCUTANEOUS
  Administered 2021-01-11: 3 [IU] via SUBCUTANEOUS
  Administered 2021-01-11: 4 [IU] via SUBCUTANEOUS
  Administered 2021-01-12 – 2021-01-13 (×5): 3 [IU] via SUBCUTANEOUS

## 2020-12-29 MED ORDER — OSMOLITE 1.2 CAL PO LIQD
1000.0000 mL | ORAL | Status: DC
  Administered 2020-12-29 – 2021-01-11 (×12): 1000 mL
  Filled 2020-12-29 (×28): qty 1000

## 2020-12-29 MED ORDER — HYDRALAZINE HCL 20 MG/ML IJ SOLN
10.0000 mg | INTRAMUSCULAR | Status: DC | PRN
  Administered 2020-12-31: 10 mg via INTRAVENOUS
  Filled 2020-12-29 (×3): qty 1

## 2020-12-29 MED ORDER — FUROSEMIDE 10 MG/ML IJ SOLN
80.0000 mg | Freq: Once | INTRAMUSCULAR | Status: AC
  Administered 2020-12-29: 80 mg via INTRAVENOUS
  Filled 2020-12-29: qty 8

## 2020-12-29 MED ORDER — DEXMEDETOMIDINE HCL IN NACL 400 MCG/100ML IV SOLN
0.0000 ug/kg/h | INTRAVENOUS | Status: DC
  Administered 2020-12-29: 0.4 ug/kg/h via INTRAVENOUS
  Administered 2020-12-30: 0.2 ug/kg/h via INTRAVENOUS
  Administered 2020-12-30: 0.8 ug/kg/h via INTRAVENOUS
  Filled 2020-12-29 (×4): qty 100

## 2020-12-29 MED ORDER — SODIUM CHLORIDE 0.9% FLUSH
10.0000 mL | Freq: Two times a day (BID) | INTRAVENOUS | Status: DC
  Administered 2020-12-30: 20 mL
  Administered 2020-12-30 – 2021-01-05 (×12): 10 mL
  Administered 2021-01-06: 20 mL

## 2020-12-29 NOTE — Progress Notes (Addendum)
ANTICOAGULATION& ANTIBIOTIC CONSULT NOTE - Follow Up Consult  Pharmacy Consult for Heparin Indication: Left IJ and subclavian non-occlusive clots  Allergies  Allergen Reactions  . Ibuprofen Other (See Comments)    Blood in the stool.  . Metformin Other (See Comments)    Tremors, muscles  locking up, unable to control extremities    Patient Measurements: Height: 6\' 2"  (188 cm) Weight: 126.7 kg (279 lb 5.2 oz) IBW/kg (Calculated) : 82.2  Heparin dosing wt: 110 kg  Vital Signs: Temp: 100.5 F (38.1 C) (04/19 1109) Temp Source: Axillary (04/19 0712) BP: 185/111 (04/19 1300) Pulse Rate: 120 (04/19 1300)  Labs: Recent Labs    12/27/20 0428 12/27/20 0602 12/28/20 0459 12/28/20 1655 12/29/20 0412 12/29/20 0939 12/29/20 1220  HGB 9.4*  --  9.2*  --  8.9* 7.8*  --   HCT 30.5*  --  30.5*  --  29.8* 23.0*  --   PLT 228  --  233  --  217  --   --   APTT  --  75*  --   --   --   --   --   LABPROT  --  16.1*  --   --   --   --   --   INR  --  1.3*  --   --   --   --   --   HEPARINUNFRC 0.31  --  0.34  --  0.27*  --  0.32  CREATININE 1.71*  --  1.79* 1.54* 1.50*  --   --     Estimated Creatinine Clearance: 75.9 mL/min (A) (by C-G formula based on SCr of 1.5 mg/dL (H)).  Assessment: 60 year old male admitted for hypoxemic respiratory failure and septic shock 2/2 E. Coli bacteremia and UTI, and ATN. Now with Left IJ and subclavian non-occlusive clots.  Pharmacy consulted to start heparin drip. Heparin dosing weight of 113 kg.   Heparin level 0.32 is therapeutic after rate increase to 3000 units/hr. No issues IV infusion or access. No noted bleeding.    Goal of Therapy:  Heparin level 0.3-0.7 units/ml Monitor platelets by anticoagulation protocol: Yes   Plan:  Increase heparin to 3100 units/hr to ensure remains  Check 6 hr heparin level Monitor heparin level, CBC, and s/s of bleeding daily  F/u transition to DOAC     41, PharmD Clinical Pharmacist  12/29/2020  2:15 PM

## 2020-12-29 NOTE — Progress Notes (Signed)
ANTICOAGULATION& ANTIBIOTIC CONSULT NOTE - Follow Up Consult  Pharmacy Consult for Heparin Indication: Left IJ and subclavian non-occlusive clots  Labs: Recent Labs    12/27/20 0428 12/27/20 0602 12/28/20 0459 12/28/20 1655 12/29/20 0412 12/29/20 0939 12/29/20 1220 12/29/20 1600 12/29/20 2100  HGB 9.4*  --  9.2*  --  8.9* 7.8*  --   --   --   HCT 30.5*  --  30.5*  --  29.8* 23.0*  --   --   --   PLT 228  --  233  --  217  --   --   --   --   APTT  --  75*  --   --   --   --   --   --   --   LABPROT  --  16.1*  --   --   --   --   --   --   --   INR  --  1.3*  --   --   --   --   --   --   --   HEPARINUNFRC 0.31  --  0.34  --  0.27*  --  0.32  --  0.31  CREATININE 1.71*  --  1.79* 1.54* 1.50*  --   --  1.52*  --    Assessment: 60 year old male admitted for hypoxemic respiratory failure and septic shock 2/2 E. Coli bacteremia and UTI, and ATN. Now with Left IJ and subclavian non-occlusive clots.  Pharmacy consulted to start heparin drip. Heparin dosing weight of 113 kg.   Heparin level 0.31 is therapeutic.    Goal of Therapy:  Heparin level 0.3-0.7 units/ml Monitor platelets by anticoagulation protocol: Yes   Plan:  Continue heparin at 3100 units/hr Monitor heparin level, CBC, and s/s of bleeding daily  F/u transition to DOAC    Talbert Cage, PharmD Clinical Pharmacist  12/29/2020 11:16 PM

## 2020-12-29 NOTE — Progress Notes (Signed)
Nutrition Follow-up  DOCUMENTATION CODES:   Obesity unspecified  INTERVENTION:   TF via Cortrak: Change  to Osmolite 1.2 at 75 ml/h (1800 ml per day). Prosource TF 90 ml QID.  Provides 2480 kcal, 188 gm protein, 1476 ml free water daily.   NUTRITION DIAGNOSIS:   Increased nutrient needs related to wound healing as evidenced by estimated needs.  Ongoing   GOAL:   Patient will meet greater than or equal to 90% of their needs  Met with TF  MONITOR:   TF tolerance,Labs,Diet advancement,PO intake  REASON FOR ASSESSMENT:   Ventilator,Consult Enteral/tube feeding initiation and management  ASSESSMENT:   60 yo male admitted with acute metabolic encephalopathy, acute respiratory failure, septic shock, E coli bacteremia, E coli UTI. PMH includes recent severe diarrhea and vomiting, chronic pain, HTN, HLD.  Extubated 4/16. Currently on heated high flow nasal canula.   Receiving hydrotherapy to buttock wound 6 times per week.  SLP unable to complete swallow evaluation today d/t patient's level of consciousness. Remains NPO. Cortrak placed 4/18, tip is gastric. Receiving Vital AF 1.2 at 65 ml/h with Prosource TF 90 ml TID to provide 2112 kcal, 183 gm protein, 1264 ml free water daily. Free water flushes 400 ml q 4 hours.  Labs reviewed. Sodium 159 CBG: 168-107  Medications reviewed and include Colace, Novolog SSI, Levemir, Miralax, vitamin B-12, Precedex. IVF: D5 at 50 ml/h  Weight continues to trend downward.   Diet Order:   Diet Order            Diet NPO time specified  Diet effective now                 EDUCATION NEEDS:   Not appropriate for education at this time  Skin:  Skin Assessment: Skin Integrity Issues: Skin Integrity Issues:: Incisions,Unstageable Unstageable: DTPI to the left foot and sacral area Incisions: penis  Last BM:  4/19 type 7, 300 ml x 24 h  Height:   Ht Readings from Last 1 Encounters:  12/25/20 _0  (1.88 m)    Weight:    Wt Readings from Last 1 Encounters:  12/29/20 126.7 kg    Ideal Body Weight:  86.4 kg  BMI:  Body mass index is 35.86 kg/m.  Estimated Nutritional Needs:   Kcal:  2160-2590  Protein:  173-216 gm  Fluid:  >/= 2 L    Lucas Mallow, RD, LDN, CNSC Please refer to Amion for contact information.

## 2020-12-29 NOTE — Progress Notes (Signed)
SLP Cancellation Note  Patient Details Name: [name] MRN: [id number] DOB: 1961-02-10   Cancelled treatment:       Reason Eval/Treat Not Completed: Patient's level of consciousness   Rasheeda Mulvehill, Riley Nearing 12/29/2020, 10:50 AM

## 2020-12-29 NOTE — Consult Note (Signed)
WOC Nurse wound follow up Patient receiving care in [facility] 2M10. Pt is receiving hydrotherapy to buttock wound. See PT notes for details. Also receiving santyl for enzymatic debridement. Continue this POC. Helmut Muster, RN, MSN, CWOCN, CNS-BC, pager [phone]

## 2020-12-29 NOTE — Progress Notes (Signed)
Peripherally Inserted Central Catheter Placement  The IV Nurse has discussed with the patient and/or persons authorized to consent for the patient, the purpose of this procedure and the potential benefits and risks involved with this procedure.  The benefits include less needle sticks, lab draws from the catheter, and the patient may be discharged home with the catheter. Risks include, but not limited to, infection, bleeding, blood clot (thrombus formation), and puncture of an artery; nerve damage and irregular heartbeat and possibility to perform a PICC exchange if needed/ordered by physician.  Alternatives to this procedure were also discussed.  Bard Power PICC patient education guide, fact sheet on infection prevention and patient information card has been provided to patient /or left at bedside.  PICC placed by Vernon Prey, RN  PICC Placement Documentation  PICC Triple Lumen 12/29/20 PICC Right Cephalic 39 cm 0 cm (Active)  Indication for Insertion or Continuance of Line Prolonged intravenous therapies;Poor Vasculature-patient has had multiple peripheral attempts or PIVs lasting less than 24 hours 12/29/20 2236  Exposed Catheter (cm) 0 cm 12/29/20 2236  Site Assessment Clean;Dry;Intact 12/29/20 2236  Lumen #1 Status Flushed;Saline locked;Blood return noted 12/29/20 2236  Lumen #2 Status Flushed;Saline locked;Blood return noted 12/29/20 2236  Lumen #3 Status Flushed;Saline locked;Blood return noted 12/29/20 2236  Dressing Type Transparent 12/29/20 2236  Dressing Status Clean;Dry;Intact 12/29/20 2236  Antimicrobial disc in place? Yes 12/29/20 2236  Safety Lock Not Applicable 12/29/20 2236  Line Care Connections checked and tightened 12/29/20 2236  Line Adjustment (NICU/IV Team Only) No 12/29/20 2236  Dressing Intervention New dressing 12/29/20 2236  Dressing Change Due 01/05/21 12/29/20 2236       Carissa Musick, Lajean Manes 12/29/2020, 10:37 PM

## 2020-12-29 NOTE — Progress Notes (Signed)
Per Dr. Earlene Plater, ID okay to place TL PICC for removal of IJ central line.

## 2020-12-29 NOTE — Progress Notes (Signed)
Physical Therapy Wound Treatment Patient Details  Name: [name] MRN: [id number] Date of Birth: 1961-07-25  Today's Date: 12/29/2020 Time: 1696-7893 Time Calculation (min): 29 min  Subjective  Subjective Assessment Subjective: Pt restless, attempting to roll onto his back Patient and Family Stated Goals: None stated Date of Onset:  (Unknown) Prior Treatments:  (Dressing changes)  Pain Score:  2/10  Wound Assessment  Pressure Injury 12/21/20 Right Deep Tissue Pressure Injury - Purple or maroon localized area of discolored intact skin or blood-filled blister due to damage of underlying soft tissue from pressure and/or shear. purple with blister area that has torn (Active)  Dressing Type ABD;Barrier Film (skin prep);Gauze (Comment);Moist to dry;Santyl 12/29/20 1226  Dressing Changed;Dry;Clean;Intact 12/29/20 1226  Dressing Change Frequency Daily 12/29/20 1226  State of Healing Eschar 12/29/20 1226  Site / Wound Assessment Yellow;Black;Red 12/29/20 1226  % Wound base Red or Granulating 15% 12/29/20 1226  % Wound base Yellow/Fibrinous Exudate 60% 12/29/20 1226  % Wound base Black/Eschar 25% 12/29/20 1226  % Wound base Other/Granulation Tissue (Comment) 0% 12/29/20 1226  Peri-wound Assessment Intact 12/29/20 1226  Wound Length (cm) 10.3 cm 12/25/20 1100  Wound Width (cm) 5 cm 12/25/20 1100  Wound Depth (cm) 0.1 cm 12/25/20 1100  Wound Surface Area (cm^2) 51.5 cm^2 12/25/20 1100  Wound Volume (cm^3) 5.15 cm^3 12/25/20 1100  Tunneling (cm) 0 12/25/20 1100  Undermining (cm) 0 12/25/20 1100  Margins Attached edges (approximated) 12/29/20 0800  Drainage Amount Minimal 12/29/20 1226  Drainage Description Sanguineous 12/29/20 1226  Treatment Debridement (Selective);Hydrotherapy (Pulse lavage);Packing (Saline gauze) 12/29/20 1226      Hydrotherapy Pulsed lavage therapy - wound location: R buttock Pulsed Lavage with Suction (psi): 16 psi Pulsed Lavage with Suction - Normal Saline  Used: 1000 mL Pulsed Lavage Tip: Tip with splash shield Selective Debridement Selective Debridement - Location: R buttock Selective Debridement - Tools Used: Forceps,Scalpel Selective Debridement - Tissue Removed: eschar, yellow slough    Wound Assessment and Plan  Wound Therapy - Assess/Plan/Recommendations Wound Therapy - Clinical Statement: Pt with continued restlessness, thus making it difficult to achieve optimal positioning for sharp debridement. Pt will benefit from hydrotherapy to cleanse wound and decrease amount of necrotic tissue. Wound Therapy - Functional Problem List: Global weakness in the setting of critical illness and extended ICU stay. Factors Delaying/Impairing Wound Healing: Immobility,Multiple medical problems,Polypharmacy,Infection - systemic/local Hydrotherapy Plan: Debridement,Dressing change,Patient/family education,Pulsatile lavage with suction Wound Therapy - Frequency: 6X / week Wound Therapy - Follow Up Recommendations: dressing changes by RN  Wound Therapy Goals- Improve the function of patient's integumentary system by progressing the wound(s) through the phases of wound healing (inflammation - proliferation - remodeling) by: Wound Therapy Goals - Improve the function of patient's integumentary system by progressing the wound(s) through the phases of wound healing by: Decrease Necrotic Tissue to: 25% Decrease Necrotic Tissue - Progress: Progressing toward goal Increase Granulation Tissue to: 75% Increase Granulation Tissue - Progress: Progressing toward goal Improve Drainage Characteristics: Min,Serous Improve Drainage Characteristics - Progress: Progressing toward goal Goals/treatment plan/discharge plan were made with and agreed upon by patient/family: No, Patient unable to participate in goals/treatment/discharge plan and family unavailable Time For Goal Achievement: 7 days Wound Therapy - Potential for Goals: Good  Goals will be updated until maximal  potential achieved or discharge criteria met.  Discharge criteria: when goals achieved, discharge from hospital, MD decision/surgical intervention, no progress towards goals, refusal/missing three consecutive treatments without notification or medical reason.  GP     Charges PT Wound  Care Charges $Wound Debridement up to 20 cm: < or equal to 20 cm $ Wound Debridement each add'l 20 sqcm: 2 $PT PLS Gun and Tip: 1 Supply $PT Hydrotherapy Visit: 1 Visit      Wyona Almas, PT, DPT Acute Rehabilitation Services Pager [phone] Office [phone]  Deno Etienne 12/29/2020, 12:29 PM

## 2020-12-29 NOTE — Progress Notes (Addendum)
NAME:  [name], MRN:  [id number], DOB:  1961/02/14, LOS: 28 ADMISSION DATE:  12/10/2020,   History of Present Illness:  60 year old male who presented 3/31 to the ER from home with 4-5 day hx of diarrhea and vomiting.  He developed confusion and became belligerent at home. Initial ER evaluation found him to be hypotensive, combative, confused, and was intubated.  He was noted to have mottling over his trunk and extremities.  Admitted for hypoxemic respiratory failure and septic shock 2/2 E. Coli bacteremia and UTI, and ATN.   Pertinent  Medical History  Prior smoker quit 15 years ago 82-100-pack-year history Sinusitis  Chronic pain syndrome Hypertension Hyperlipidemia Chronic back pain  Significant Hospital Events: Including procedures, antibiotic start and stop dates in addition to other pertinent events   . 03/31 patient was intubated . 03/31 triple-lumen catheter inserted . 4/1 off pressors. E coli bacteremia, e coli uti. Some improvement in pulm opacities on CXR. On rocephin. TTE with LVEF 40-45%, LV global hypokinesis, G2DD, normal RV, dilated aortic root at 11m.   . 4/5 extubated, failed immediately due to mucous plugging identified on bronch after re-intubated. CT ABD with 438mleft ureteral stone with mild edema of left kideny, large areas of consolidation in bilateral lung bases.  . 4/6 broadened to cefepime in light of pneumonia on CT abdomen; urology consulted for distal left ureteral stone, to OR overnight for stent placement, UCx sent.  Versed added for agitation (on fent / precedex) . 4/7 Off vasopressors . 4/11 Vasopressors restarted  . 4/13: Left IJ holiday . 4/15: Right IJ placed  . 4/16: extubated, remained agitated and restless requiring IV Precedex infusion  Interim History / Subjective:  Remains on Precedex for agitation, off vasopressors.   Objective   Blood pressure (!) 109/92, pulse (!) 103, temperature 99.2 F (37.3 C), temperature source Axillary,  resp. rate (!) 30, height _0  (1.88 m), weight 126.7 kg, SpO2 97 %.    FiO2 (%):  [50 %] 50 %   Intake/Output Summary (Last 24 hours) at 12/29/2020 0900 Last data filed at 12/29/2020 0600 Gross per 24 hour  Intake 4647.16 ml  Output 3300 ml  Net 1347.16 ml    Filed Weights   12/27/20 0432 12/28/20 0345 12/29/20 0400  Weight: 128.7 kg 127.8 kg 126.7 kg    Examination:  General: Obese adult male, lying in bed  HEENT: small bore ng in place, Dry MM  Neuro: Opens eyes with stimulation, does not follow commands, moves extremities spontaneously, no movement noted to LUE, pupils 3 mm and reactive  PULM: diminished to bases, mild accessory muscle use  GI: Obese, soft, active bowel sounds  Extremities: -edema  Skin: sacral stage II pressure ulcer  Imaging/Labs  4/11 renal USKoreaminimal left hydronephrosis  4/05 Ct abdomen: 58m23mistal ureteral stone with mild edema left kidney  4/01 renal US:[place] 4/12 MRI> 9mm73mbacute/chronic ischemic infarct of R frontal centrum semi ovale. 4/12 echo: limited bubble study, nondiagnostic  4/12 carotid US: [place] mild 1-39% stenosis carotid, unable to evaluate left  4/14 CT abd/pelvis: left ureteral stent in place, distal left stone still present, bibasilar consolidation 4/11: BAL with MSSA  4/15: blood cultures growing MRSE  Resolved Hospital Problem list   Traumatic Urethral Bleeding Septic shock due to E.Coli UTI, E. Coli bacteremia, Pyelonephritis Hypernatremia  Assessment & Plan:   Sepsis with Recurrent Fevers MSSA Pneumonia/MRSE Bacteremia  -S/P Treatment for E.Coli Bacteremia -ECHO with no evidence of vegetation  Plan -  ID Following, Continue IV vancomycin for 4-6 weeks given clot on left treating for endovascular infection  -Trend WBC and Fever Curve  -Obtain CXR with recurrent fevers.   Acute hypoxic and hypercarbic respiratory failure 2/2 MSSA Pneumonia Extubated on 4/15 Question Underlying OSA Plan -Continue to titrate  supplemental oxygen for saturation goal >92 (currently on 4L [place]) -BiPAP at HS   QTC prolongation  Plan -Avoid QTC prolonging medications  -Seroquel D/C on 4/18 -Obtain EKG -Maintain MAG >2.0 and K >4  Acute kidney injury secondary to sepsis, ATN, pyelonephritis > Improving  L Uretal stone w/o hydronephrosis s/p ureteral stent placement 4/6. Repeat US w/mild L hydronephrosis.  Hypernatremia Hypokalemia >> Improving Plan -Trend BMP  -Continue to hold further diuresis  -Continue free water to 400 ml q4h. Start Dextrose. 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 Plan -Continue ASA/statin -PT/OT/SLP   Left IJ and Subclavian DVT versus infection Plan -Continue heparin infusion. Unsure no further invasive testing and then can transition  -Continue IV antibiotics  NASH cirrhosis Plan  -Outpatient follow-up with GI  Macrocytic anemia Plan -Trend CBC  Diabetes type 2 Plan -Increase Levemir to 35 units twice daily -Continue sliding scale insulin -Continue TF Insulin coverage. Increased to 8 units previously 6.    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 Will order  PICC for prolonged therapy needs  Foley:  Yes, and it is still needed   Mobility:  bed rest  PT consulted: N/A Last date of multidisciplinary goals of care discussion: will update family Code Status:  full code Disposition: ICU  Total critical care time: 32 minutes  Critical care time was exclusive of separately billable procedures and treating other patients.   Critical care was necessary to treat or prevent imminent or life-threatening deterioration.   Critical care was time spent personally by me on the following activities: development of treatment plan with patient and/or surrogate as well as nursing, discussions with consultants, evaluation of patient's response to treatment, examination of patient, obtaining history from patient or surrogate, ordering and performing treatments and interventions, ordering and review of laboratory studies, ordering and review of radiographic studies, pulse oximetry and re-evaluation of patient's condition.   Hayden Pedro, AGACNP-BC [facility]  See Amion for pager If no response to pager, please call [phone] until 7pm After 7pm, Please call E-link [phone],

## 2020-12-29 NOTE — Progress Notes (Signed)
[facility]    Date of Admission:  12/10/2020      ID: [name] is a 60 y.o. male with MRSE bacteremia Active Problems:   Acute respiratory failure (HCC)   AKI (acute kidney injury) (HCC)   Bacteremia due to Escherichia coli   Ureteral stone   Pyelonephritis   Cerebral thrombosis with cerebral infarction   Staphylococcus epidermidis bacteremia    Subjective: Low grade fever yesterday of 100.65F. having bedside team doing hydrotherapy to left buttock decub, eschar removed but fat necrosis noted. Still remains agitated on precedex. Afebrile this morning  Medications:  . aspirin  81 mg Per Tube Daily  . atorvastatin  40 mg Per Tube Daily  . chlorhexidine  15 mL Mouth Rinse BID  . Chlorhexidine Gluconate Cloth  6 each Topical Daily  . collagenase   Topical Daily  . docusate  50 mg Per Tube Daily  . feeding supplement (PROSource TF)  90 mL Per Tube TID  . free water  400 mL Per Tube Q4H  . insulin aspart  0-20 Units Subcutaneous Q4H  . insulin aspart  8 Units Subcutaneous Q4H  . insulin detemir  35 Units Subcutaneous BID  . mouth rinse  15 mL Mouth Rinse q12n4p  . OLANZapine  10 mg Per Tube Daily  . oxyCODONE  5 mg Per Tube Q6H  . polyethylene glycol  17 g Per Tube Daily  . sodium chloride flush  10-40 mL Intracatheter Q12H  . vitamin B-12  100 mcg Per Tube Daily    Objective: Vital signs in last 24 hours: Temp:  [97.6 F (36.4 C)-101.2 F (38.4 C)] 100.5 F (38.1 C) (04/19 1109) Pulse Rate:  [91-124] 112 (04/19 1100) Resp:  [18-39] 35 (04/19 1200) BP: (105-162)/(52-99) 162/99 (04/19 1200) SpO2:  [90 %-100 %] 99 % (04/19 1100) Weight:  [126.7 kg] 126.7 kg (04/19 0400) Physical Exam  Constitutional: He is oriented to person, sedated but easily awakens. He appears well-developed and well-nourished. No distress.  HENT:  Mouth/Throat: Oropharynx is clear and moist. No oropharyngeal exudate.  Cardiovascular: Normal rate, regular rhythm and  normal heart sounds. Exam reveals no gallop and no friction rub.  No murmur heard.  Pulmonary/Chest: Effort normal and breath sounds normal. No respiratory distress. He has no wheezes.  Abdominal: Soft. Bowel sounds are normal. He exhibits no distension. There is no tenderness. Rectal tube in place Buttock = large left buttock pressure ulcer, with fat necrosis noted;otherwise Skin is warm and dry. No rash noted. No erythema.      Lab Results Recent Labs    12/28/20 0459 12/28/20 1655 12/29/20 0412 12/29/20 0939  WBC 8.1  --  8.0  --   HGB 9.2*  --  8.9* 7.8*  HCT 30.5*  --  29.8* 23.0*  NA 154* 159* 158* 159*  K 2.9* 3.5 3.5 3.7  CL 119* 125* 123*  --   CO2 30 28 28   --   BUN 89* 80* 76*  --   CREATININE 1.79* 1.54* 1.50*  --    Liver Panel Recent Labs    12/28/20 0459  PROT 7.6  ALBUMIN 2.0*  AST 33  ALT 18  ALKPHOS 55  BILITOT 0.7    Microbiology: 4/15 blood cx MRSE(really 4/13) 4/16 blood cx NGTD Studies/Results: DG Abd 1 View  Result Date: 12/27/2020 CLINICAL DATA:  Nasogastric tube placement EXAM: ABDOMEN - 1 VIEW COMPARISON:  12/15/2020 FINDINGS: Nasogastric tube appears looped within the stomach  with its tip in the region of the gastric cardia. The abdomen is largely excluded from view. Central venous catheter tip noted within the superior vena cava. Minimal left basilar atelectasis. IMPRESSION: Nasogastric tube looped within the stomach with its tip in the region of the gastric cardia. Electronically Signed   By: Helyn Numbers MD   On: 12/27/2020 15:59   DG CHEST PORT 1 VIEW  Result Date: 12/29/2020 CLINICAL DATA:  Hypoxia EXAM: PORTABLE CHEST 1 VIEW COMPARISON:  12/24/2020 FINDINGS: Right internal jugular central venous catheter tip: SVC. Feeding tube extends into the stomach. Interval removal of the endotracheal tube. Atherosclerotic calcification of the aortic arch. Mildly improved aeration at the right lung base and right mid lung compared to previous.  Indistinct pulmonary vasculature. Bandlike density in the left mid lung noted. Improved retrocardiac opacity on the left. Low lung volumes. No blunting of the costophrenic angles. IMPRESSION: 1. Improved airspace opacities especially at the right lung base. 2. Low lung volumes. Electronically Signed   By: Gaylyn Rong M.D.   On: 12/29/2020 11:33   [place] EKG SITE RITE  Result Date: 12/29/2020 If Site Rite image not attached, placement could not be confirmed due to current cardiac rhythm.    Assessment/Plan: MRSE bacteremia with L IJ/[place] septic thrombophlebitis = continue with vancomycin at this time. Appears to tolerate current dosing. CR back to baseline function. Planing for 6 wks.  Therapeutic drug monitoring = will continue to check at least weekly vanco level  Low grade fever = suspect this is due to thrombophlebitis. Reassuring that WBC is unchanged at 8  Buttock pressure ulcer = continue with hydrotherapy and chemical debridement with santyl.  [facility] Cell: [phone] Pager: [phone]  12/29/2020, 12:22 PM

## 2020-12-29 NOTE — Progress Notes (Signed)
ANTICOAGULATION CONSULT NOTE - Follow Up Consult  Pharmacy Consult for Heparin Indication: Left IJ and subclavian non-occlusive clots  Labs: Recent Labs    12/27/20 0428 12/27/20 0602 12/28/20 0459 12/28/20 1655 12/29/20 0412  HGB 9.4*  --  9.2*  --  8.9*  HCT 30.5*  --  30.5*  --  29.8*  PLT 228  --  233  --  217  APTT  --  75*  --   --   --   LABPROT  --  16.1*  --   --   --   INR  --  1.3*  --   --   --   HEPARINUNFRC 0.31  --  0.34  --  0.27*  CREATININE 1.71*  --  1.79* 1.54*  --     Assessment: 59 year old male admitted for hypoxemic respiratory failure and septic shock 2/2 E. Coli bacteremia and UTI, and ATN. Now with Left IJ and subclavian non-occlusive clots.  Pharmacy consulted to start heparin drip. Heparin dosing weight of 113 kg.   Heparin level 0.27 units/ml  Goal of Therapy:  Heparin level 0.3-0.7 units/ml Monitor platelets by anticoagulation protocol: Yes   Plan:  Increase heparin to 3000 units/hr Daily HL and CBC  Thanks for allowing pharmacy to be a part of this patient's care.  Talbert Cage, PharmD Clinical Pharmacist 12/29/2020 4:57 AM

## 2020-12-29 NOTE — Progress Notes (Deleted)
NAME:  [name], MRN:  [id number], DOB:  03-24-1961, LOS: 29 ADMISSION DATE:  12/10/2020,   History of Present Illness:  60 year old male who presented 3/31 to the ER from home with 4-5 day hx of diarrhea and vomiting.  He developed confusion and became belligerent at home. Initial ER evaluation found him to be hypotensive, combative, confused, and was intubated.  He was noted to have mottling over his trunk and extremities.  Admitted for hypoxemic respiratory failure and septic shock 2/2 E. Coli bacteremia and UTI, and ATN.   Pertinent  Medical History  Prior smoker quit 15 years ago 82-100-pack-year history Sinusitis  Chronic pain syndrome Hypertension Hyperlipidemia Chronic back pain  Significant Hospital Events: Including procedures, antibiotic start and stop dates in addition to other pertinent events   . 03/31 patient was intubated . 03/31 triple-lumen catheter inserted . 4/1 off pressors. E coli bacteremia, e coli uti. Some improvement in pulm opacities on CXR. On rocephin. TTE with LVEF 40-45%, LV global hypokinesis, G2DD, normal RV, dilated aortic root at 65m.   . 4/5 extubated, failed immediately due to mucous plugging identified on bronch after re-intubated. CT ABD with 466mleft ureteral stone with mild edema of left kideny, large areas of consolidation in bilateral lung bases.  . 4/6 broadened to cefepime in light of pneumonia on CT abdomen; urology consulted for distal left ureteral stone, to OR overnight for stent placement, UCx sent.  Versed added for agitation (on fent / precedex) . 4/7 Off vasopressors . 4/11 Vasopressors restarted  . 4/13: Left IJ holiday . 4/15: Right IJ placed  . 4/16: extubated, remained agitated and restless requiring IV Precedex infusion  Interim History / Subjective:  Remains on Precedex for agitation, off vasopressors.   Objective   Blood pressure 114/68, pulse 91, temperature 99.2 F (37.3 C), temperature source Axillary, resp. rate  (!) 29, height _0  (1.88 m), weight 126.7 kg, SpO2 90 %.        Intake/Output Summary (Last 24 hours) at 12/29/2020 1004 Last data filed at 12/29/2020 1000 Gross per 24 hour  Intake 5788.34 ml  Output 4025 ml  Net 1763.34 ml    Filed Weights   12/27/20 0432 12/28/20 0345 12/29/20 0400  Weight: 128.7 kg 127.8 kg 126.7 kg    Examination:  General: Obese adult male, lying in bed  HEENT: small bore ng in place, Dry MM  Neuro: Opens eyes with stimulation, does not follow commands, moves extremities spontaneously, no movement noted to LUE, pupils 3 mm and reactive  PULM: diminished to bases, mild accessory muscle use  GI: Obese, soft, active bowel sounds  Extremities: -edema  Skin: sacral stage II pressure ulcer  Imaging/Labs  4/11 renal USKoreaminimal left hydronephrosis  4/05 Ct abdomen: 53m97mistal ureteral stone with mild edema left kidney  4/01 renal US:[place]ormal 4/12 MRI> 9mm52mbacute/chronic ischemic infarct of R frontal centrum semi ovale. 4/12 echo: limited bubble study, nondiagnostic  4/12 carotid US: [place] mild 1-39% stenosis carotid, unable to evaluate left  4/14 CT abd/pelvis: left ureteral stent in place, distal left stone still present, bibasilar consolidation 4/11: BAL with MSSA  4/15: blood cultures growing MRSE  Resolved Hospital Problem list   Traumatic Urethral Bleeding Septic shock due to E.Coli UTI, E. Coli bacteremia, Pyelonephritis Hypernatremia  Assessment & Plan:   Sepsis with Recurrent Fevers MSSA Pneumonia/MRSE Bacteremia  -S/P Treatment for E.Coli Bacteremia -ECHO with no evidence of vegetation  Plan -ID Following, Continue IV vancomycin for 4-6  weeks given clot on left treating for endovascular infection  -Trend WBC and Fever Curve   Acute hypoxic and hypercarbic respiratory failure 2/2 MSSA Pneumonia Extubated on 4/15 Question Underlying OSA Plan -Continue to titrate supplemental oxygen for saturation goal >92 (currently on 4L [place]) -BiPAP at  HS   QTC prolongation  Plan -Avoid QTC prolonging medications  -Seroquel D/C on 4/18 -Obtain EKG -Maintain MAG >2.0 and K >4  Acute kidney injury secondary to sepsis, ATN, pyelonephritis > Improving  L Uretal stone w/o hydronephrosis s/p ureteral stent placement 4/6. Repeat US w/mild L hydronephrosis.  Hypernatremia Hypokalemia >> Improving Plan -Trend BMP  -Continue to hold further diuresis  -Continue free water to 400 ml q4h. Start Dextrose. 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 Plan -Continue ASA/statin -PT/OT/SLP   Left IJ and Subclavian DVT versus infection Plan -Continue heparin infusion -Continue IV antibiotics  NASH cirrhosis Plan  -Outpatient follow-up with GI  Macrocytic anemia Plan -Trend CBC  Diabetes type 2 Plan -Increase Levemir to 35 units twice daily -Continue sliding scale insulin -Continue TF Insulin coverage. Increased to 8 units from 6 today.   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 Will order PICC for prolonged therapy needs  Foley:  Yes, and it is still needed   Mobility:  bed rest  PT consulted: N/A Last date of  multidisciplinary goals of care discussion: will update family Code Status:  full code Disposition: ICU  Total critical care time: 32 minutes  Critical care time was exclusive of separately billable procedures and treating other patients.   Critical care was necessary to treat or prevent imminent or life-threatening deterioration.   Critical care was time spent personally by me on the following activities: development of treatment plan with patient and/or surrogate as well as nursing, discussions with consultants, evaluation of patient's response to treatment, examination of patient, obtaining history from patient or surrogate, ordering and performing treatments and interventions, ordering and review of laboratory studies, ordering and review of radiographic studies, pulse oximetry and re-evaluation of patient's condition.   Hayden Pedro, AGACNP-BC [facility]  See Amion for pager If no response to pager, please call [phone] until 7pm After 7pm, Please call E-link [phone],

## 2020-12-30 DIAGNOSIS — E870 Hyperosmolality and hypernatremia: Secondary | ICD-10-CM

## 2020-12-30 LAB — BASIC METABOLIC PANEL
Anion gap: 8 (ref 5–15)
Anion gap: 9 (ref 5–15)
BUN: 59 mg/dL — ABNORMAL HIGH (ref 6–20)
BUN: 50 mg/dL — ABNORMAL HIGH (ref 6–20)
CO2: 26 mmol/L (ref 22–32)
CO2: 24 mmol/L (ref 22–32)
Calcium: 8.1 mg/dL — ABNORMAL LOW (ref 8.9–10.3)
Calcium: 8.6 mg/dL — ABNORMAL LOW (ref 8.9–10.3)
Chloride: 119 mmol/L — ABNORMAL HIGH (ref 98–111)
Chloride: 118 mmol/L — ABNORMAL HIGH (ref 98–111)
Creatinine, Ser: 1.18 mg/dL (ref 0.61–1.24)
Creatinine, Ser: 1.09 mg/dL (ref 0.61–1.24)
GFR, Estimated: >60 mL/min (ref >60)
GFR, Estimated: >60 mL/min (ref >60)
Glucose, Bld: 191 mg/dL — ABNORMAL HIGH (ref 70–99)
Glucose, Bld: 206 mg/dL — ABNORMAL HIGH (ref 70–99)
Potassium: 3.2 mmol/L — ABNORMAL LOW (ref 3.5–5.1)
Potassium: 4.1 mmol/L (ref 3.5–5.1)
Sodium: 153 mmol/L — ABNORMAL HIGH (ref 135–145)
Sodium: 151 mmol/L — ABNORMAL HIGH (ref 135–145)

## 2020-12-30 LAB — CBC
HCT: 29.6 % — ABNORMAL LOW (ref 39.0–52.0)
Hemoglobin: 8.7 g/dL — ABNORMAL LOW (ref 13.0–17.0)
MCH: 31.1 pg (ref 26.0–34.0)
MCHC: 29.4 g/dL — ABNORMAL LOW (ref 30.0–36.0)
MCV: 105.7 fL — ABNORMAL HIGH (ref 80.0–100.0)
Platelets: 198 10*3/uL (ref 150–400)
RBC: 2.8 MIL/uL — ABNORMAL LOW (ref 4.22–5.81)
RDW: 14.3 % (ref 11.5–15.5)
WBC: 8 10*3/uL (ref 4.0–10.5)
nRBC: 0 % (ref 0.0–0.2)

## 2020-12-30 LAB — GLUCOSE, CAPILLARY
Glucose-Capillary: 171 mg/dL — ABNORMAL HIGH (ref 70–99)
Glucose-Capillary: 141 mg/dL — ABNORMAL HIGH (ref 70–99)
Glucose-Capillary: 141 mg/dL — ABNORMAL HIGH (ref 70–99)
Glucose-Capillary: 124 mg/dL — ABNORMAL HIGH (ref 70–99)
Glucose-Capillary: 173 mg/dL — ABNORMAL HIGH (ref 70–99)
Glucose-Capillary: 121 mg/dL — ABNORMAL HIGH (ref 70–99)

## 2020-12-30 LAB — PHOSPHORUS: Phosphorus: 2.5 mg/dL (ref 2.5–4.6)

## 2020-12-30 LAB — HEPARIN LEVEL (UNFRACTIONATED): Heparin Unfractionated: 0.37 IU/mL (ref 0.30–0.70)

## 2020-12-30 LAB — MAGNESIUM: Magnesium: 2.5 mg/dL — ABNORMAL HIGH (ref 1.7–2.4)

## 2020-12-30 MED ORDER — POTASSIUM CHLORIDE 10 MEQ/50ML IV SOLN
10.0000 meq | INTRAVENOUS | Status: AC
  Administered 2020-12-30 (×4): 10 meq via INTRAVENOUS
  Filled 2020-12-30 (×4): qty 50

## 2020-12-30 MED ORDER — FREE WATER
125.0000 mL | Status: DC
  Administered 2020-12-30 – 2020-12-31 (×23): 125 mL

## 2020-12-30 MED ORDER — METOLAZONE 2.5 MG PO TABS
5.0000 mg | ORAL_TABLET | Freq: Once | ORAL | Status: AC
  Administered 2020-12-30: 5 mg
  Filled 2020-12-30: qty 2

## 2020-12-30 MED ORDER — FENTANYL CITRATE (PF) 100 MCG/2ML IJ SOLN
25.0000 ug | Freq: Once | INTRAMUSCULAR | Status: AC
Start: 2020-12-30 — End: 2020-12-30
  Administered 2020-12-30: 25 ug via INTRAVENOUS

## 2020-12-30 MED ORDER — OLANZAPINE 10 MG PO TABS
10.0000 mg | ORAL_TABLET | Freq: Every day | ORAL | Status: AC
  Administered 2020-12-31 – 2021-01-01 (×2): 10 mg
  Filled 2020-12-30 (×2): qty 1

## 2020-12-30 MED ORDER — ENOXAPARIN SODIUM 150 MG/ML ~~LOC~~ SOLN
1.0000 mg/kg | Freq: Two times a day (BID) | SUBCUTANEOUS | Status: DC
  Administered 2020-12-30 (×2): 126 mg via SUBCUTANEOUS
  Filled 2020-12-30 (×3): qty 0.84

## 2020-12-30 MED ORDER — POTASSIUM CHLORIDE 20 MEQ PO PACK
20.0000 meq | PACK | ORAL | Status: AC
  Administered 2020-12-30 (×2): 20 meq
  Filled 2020-12-30 (×2): qty 1

## 2020-12-30 MED ORDER — METOLAZONE 2.5 MG PO TABS: 5.0000 mg | ORAL_TABLET | Freq: Once | ORAL | Status: DC

## 2020-12-30 NOTE — Progress Notes (Signed)
  Speech Language Pathology Treatment: Dysphagia  Patient Details Name: [name] MRN: [id number] DOB: 19-Nov-1960 Today's Date: 12/30/2020 Time: 0932-3557 SLP Time Calculation (min) (ACUTE ONLY): 19.3 min  Assessment / Plan / Recommendation Clinical Impression  [name] was seen for dysphagia treatment with his wife present. [name]'s level of alertness was notably improved compared to the evaluation. He participated in conversation, but confusion was noted. [name] was on 15L HFNC during the session which is improved from 30L noted yesterday. Vocal quality was mildly hoarse and intensity reduced; [name]'s wife reported that the [name] does have a "gruff" voice at baseline, but stated that this is not his baseline. No s/sx of aspiration were noted with solids, ice chips, or with thin liquids via tsp. Mastication was mildly prolonged, but oral clearance was adequate and bolus awareness was functional. [name] demonstrated coughing with thin liquids via tsp. [name]'s NPO status will be maintained with allowance of ice chips following oral care. A modified barium swallow study is recommended prior to initiation of an oral diet, but this cannot be conducted until he requires at most 10L. [name]'s case was discussed with Asher Muir, RN who indicated that his O2 will be reduced and his status monitored. SLP will continue to follow [name].    HPI HPI: [name] is a 60 year old male who presented 3/31 to the ED with 4-5 day hx of diarrhea and vomiting. He developed confusion and became belligerent at home. Initial ED evaluation found him to be hypotensive, combative, confused, and he was intubated.  He was noted to have mottling over his trunk and extremities and was  admitted for hypoxemic respiratory failure and septic shock 2/2 E. Coli bacteremia and UTI, and ATN. ETT 3/31-4/5; reintubated 4/5-4/16. 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 CXR 4/14: Worsening pneumonia in both  lungs, most confluent at the RIGHT  lung base.      SLP Plan  Continue with current plan of care       Recommendations  Diet recommendations: NPO (ice echips after oral care) Medication Administration: Via alternative means                Oral Care Recommendations: Oral care QID;Staff/trained caregiver to provide oral care Follow up Recommendations:  (TBD) SLP Visit Diagnosis: Dysphagia, unspecified (R13.10) Plan: Continue with current plan of care       [facility] [place] I. Vear Clock, MS, CCC-SLP Acute Rehabilitation Services Office number [phone] Pager [phone]                Scheryl Marten 12/30/2020, 2:29 PM

## 2020-12-30 NOTE — Progress Notes (Addendum)
ANTICOAGULATION& ANTIBIOTIC CONSULT NOTE - Follow Up Consult  Pharmacy Consult for Heparin > Lovenox Indication: Left IJ and subclavian non-occlusive clots  Labs: Recent Labs    12/28/20 0459 12/28/20 1655 12/29/20 0412 12/29/20 0939 12/29/20 1220 12/29/20 1600 12/29/20 2100 12/30/20 0408 12/30/20 0531  HGB 9.2*  --  8.9* 7.8*  --   --   --  8.7*  --   HCT 30.5*  --  29.8* 23.0*  --   --   --  29.6*  --   PLT 233  --  217  --   --   --   --  198  --   HEPARINUNFRC 0.34  --  0.27*  --  0.32  --  0.31  --  0.37  CREATININE 1.79*   < > 1.50*  --   --  1.52*  --  1.18  --    < > = values in this interval not displayed.   Assessment: 60 year old male admitted for hypoxemic respiratory failure and septic shock 2/2 E. Coli bacteremia and UTI, and ATN. Now with Left IJ and subclavian non-occlusive clots.  Pharmacy consulted to start heparin drip.  Heparin level this morning remains therapeutic. Now switching heparin gtt to Lovenox. No bleeding per RN. CBC stable.   Goal of Therapy:  Anti-Xa level 0.6-1 units/ml 4hrs after LMWH dose given Monitor platelets by anticoagulation protocol: Yes   Plan:  Lovenox 1 mg/kg SQ q12h Discontinue heparin gtt when give first dose of Lovenox - communicated with RN Monitor CBC and s/s of bleeding daily  F/u transition to DOAC   Pervis Hocking, PharmD PGY1 Pharmacy Resident 12/30/2020 10:19 AM  Please check [email or web address] for unit-specific pharmacy phone numbers.

## 2020-12-30 NOTE — Progress Notes (Signed)
[facility]    Date of Admission:  12/10/2020      ID: [name] is a 60 y.o. male with complicated hospitalization course initially for severe sepsis of ecoli bacteremia with pyelonephritis/ureteral stones and cerebral infarction. Most recently had fevers on 4/13-blood cx grew MRSE, associated with left Ubly/IJ septic thrombophlebitis, central line removed. On vancomycin day 7 but still having intermittent fever, but WBC at 8; ongoing encephalopathy, thought to be due to opiate/benzo/prolonged ICU. No lines from time of 4/13 are in place/have been changed out. Active Problems:   Acute respiratory failure (HCC)   AKI (acute kidney injury) (HCC)   Bacteremia due to Escherichia coli   Ureteral stone   Pyelonephritis   Cerebral thrombosis with cerebral infarction   Staphylococcus epidermidis bacteremia    Subjective: febrile up to 101F, rigors on exam today. Able to say hello and oriented to self. Remains restrained.  Medications:  . aspirin  81 mg Per Tube Daily  . atorvastatin  40 mg Per Tube Daily  . chlorhexidine  15 mL Mouth Rinse BID  . Chlorhexidine Gluconate Cloth  6 each Topical Daily  . collagenase   Topical Daily  . docusate  50 mg Per Tube Daily  . enoxaparin (LOVENOX) injection  1 mg/kg Subcutaneous Q12H  . feeding supplement (PROSource TF)  90 mL Per Tube QID  . free water  125 mL Per Tube Q1H  . insulin aspart  0-20 Units Subcutaneous Q4H  . insulin aspart  8 Units Subcutaneous Q4H  . insulin detemir  35 Units Subcutaneous BID  . mouth rinse  15 mL Mouth Rinse q12n4p  . [START ON 12/31/2020] OLANZapine  10 mg Per Tube QHS  . oxyCODONE  5 mg Per Tube Q6H  . polyethylene glycol  17 g Per Tube Daily  . sodium chloride flush  10-40 mL Intracatheter Q12H  . sodium chloride flush  10-40 mL Intracatheter Q12H  . vitamin B-12  100 mcg Per Tube Daily    Objective: Vital signs in last 24 hours: Temp:  [97.9 F (36.6 C)-101 F (38.3 C)]  100.3 F (37.9 C) (04/20 1510) Pulse Rate:  [81-120] 116 (04/20 1600) Resp:  [18-38] 32 (04/20 1600) BP: (110-165)/(44-96) 122/55 (04/20 1600) SpO2:  [92 %-100 %] 96 % (04/20 1600) FiO2 (%):  [40 %-80 %] 40 % (04/20 0902) Physical Exam  Constitutional: He is oriented to person. He appears well-developed and well-nourished. +rigors HENT:  Mouth/Throat: Oropharynx is clear and moist. No oropharyngeal exudate.  Cardiovascular: Normal rate, regular rhythm and normal heart sounds. Exam reveals no gallop and no friction rub.  No murmur heard.  Pulmonary/Chest: Effort normal and breath sounds normal. No respiratory distress. He has no wheezes.  Abdominal: Soft. Bowel sounds are normal. He exhibits no distension. There is no tenderness.  Neurological: He is alert and oriented to person,but unable to follow commands for remaining neuro exam Skin: Skin is warm and dry. No rash noted. No erythema.  Psychiatric: He has a normal mood and affect. Not combative    Lab Results Recent Labs    12/29/20 0412 12/29/20 0939 12/29/20 1600 12/30/20 0408  WBC 8.0  --   --  8.0  HGB 8.9* 7.8*  --  8.7*  HCT 29.8* 23.0*  --  29.6*  NA 158* 159* 155* 153*  K 3.5 3.7 3.4* 3.2*  CL 123*  --  122* 119*  CO2 28  --  27 26  BUN 76*  --  68* 59*  CREATININE 1.50*  --  1.52* 1.18   Liver Panel Recent Labs    12/28/20 0459  PROT 7.6  ALBUMIN 2.0*  AST 33  ALT 18  ALKPHOS 55  BILITOT 0.7    Microbiology: reviewed Studies/Results: DG CHEST PORT 1 VIEW  Result Date: 12/29/2020 CLINICAL DATA:  Hypoxia EXAM: PORTABLE CHEST 1 VIEW COMPARISON:  12/24/2020 FINDINGS: Right internal jugular central venous catheter tip: SVC. Feeding tube extends into the stomach. Interval removal of the endotracheal tube. Atherosclerotic calcification of the aortic arch. Mildly improved aeration at the right lung base and right mid lung compared to previous. Indistinct pulmonary vasculature. Bandlike density in the left mid  lung noted. Improved retrocardiac opacity on the left. Low lung volumes. No blunting of the costophrenic angles. IMPRESSION: 1. Improved airspace opacities especially at the right lung base. 2. Low lung volumes. Electronically Signed   By: Gaylyn Rong M.D.   On: 12/29/2020 11:33   [place] EKG SITE RITE  Result Date: 12/29/2020 If Site Rite image not attached, placement could not be confirmed due to current cardiac rhythm.    Assessment/Plan: MRSE bacteremia plus septic thrombophlebitis = still has ongoing fevers, and question if it is all due to clot burden. Agree with plan to look for other sources of infection. Recent line change has been done. cxr from 4/19 shows some improvement since admit.  - Recommend to do lower extremity U/S looking for DVT.  - None of his medications appear c/w with fever side effect--sometimes seen with precedex.  - please check LFTs to see if any abn  Hypernatremia = slowly improving  aki = improving. Appears close to baseline  Therapeutic drug monitoring = will dose adjust vancomycin if levels are not within range.   [facility] Cell: [phone] Pager: [phone]  12/30/2020, 5:25 PM

## 2020-12-30 NOTE — Progress Notes (Signed)
Holding off on cpap for the night, pt currently on 10L [place].

## 2020-12-30 NOTE — Progress Notes (Signed)
Physical Therapy Wound Treatment Patient Details  Name: [name] MRN: [id number] Date of Birth: 1961-08-14  Today's Date: 12/30/2020 Time: 1225-1304 Time Calculation (min): 39 min  Subjective  Subjective Assessment Subjective: Pt restless, attempting to roll onto his back Patient and Family Stated Goals: None stated Date of Onset:  (Unknown) Prior Treatments:  (Dressing changes)  Pain Score:  6/10, premedicated, pt asking for more, restless  Wound Assessment  Pressure Injury 12/21/20 Right Deep Tissue Pressure Injury - Purple or maroon localized area of discolored intact skin or blood-filled blister due to damage of underlying soft tissue from pressure and/or shear. purple with blister area that has torn (Active)  Wound Image   12/25/20 1100  Dressing Type ABD;Barrier Film (skin prep);Gauze (Comment);Normal saline moist dressing;Santyl 12/30/20 1418  Dressing Changed;Clean;Dry;Intact 12/30/20 1418  Dressing Change Frequency Daily 12/30/20 1418  State of Healing Eschar 12/30/20 1418  Site / Wound Assessment Yellow;Red;Black 12/30/20 1418  % Wound base Red or Granulating 15% 12/30/20 1418  % Wound base Yellow/Fibrinous Exudate 75% 12/30/20 1418  % Wound base Black/Eschar 10% 12/30/20 1418  % Wound base Other/Granulation Tissue (Comment) 0% 12/30/20 1418  Peri-wound Assessment Intact 12/29/20 1226  Wound Length (cm) 9 cm 12/30/20 1000  Wound Width (cm) 6 cm 12/30/20 1000  Wound Depth (cm) 0.1 cm 12/25/20 1100  Wound Surface Area (cm^2) 54 cm^2 12/30/20 1000  Wound Volume (cm^3) 5.15 cm^3 12/25/20 1100  Tunneling (cm) 0 12/25/20 1100  Undermining (cm) 0 12/25/20 1100  Margins Unattached edges (unapproximated) 12/30/20 1418  Drainage Amount Minimal 12/30/20 1418  Drainage Description Serosanguineous 12/30/20 1418  Treatment Cleansed;Debridement (Selective);Hydrotherapy (Pulse lavage);Packing (Saline gauze) 12/30/20 1418   Santyl applied to wound bed prior to applying  dressing.    Hydrotherapy Pulsed lavage therapy - wound location: R buttock Pulsed Lavage with Suction (psi): 12 psi Pulsed Lavage with Suction - Normal Saline Used: 1000 mL Pulsed Lavage Tip: Tip with splash shield Selective Debridement Selective Debridement - Location: R buttock Selective Debridement - Tools Used: Forceps,Scalpel Selective Debridement - Tissue Removed: eschar, yellow slough    Wound Assessment and Plan  Wound Therapy - Assess/Plan/Recommendations Wound Therapy - Clinical Statement: Pt with continued restlessness, thus making it difficult to achieve optimal positioning for sharp debridement. Pt will benefit from hydrotherapy to cleanse wound and decrease amount of necrotic tissue. Wound Therapy - Functional Problem List: Global weakness in the setting of critical illness and extended ICU stay. Factors Delaying/Impairing Wound Healing: Immobility,Multiple medical problems,Polypharmacy,Infection - systemic/local Hydrotherapy Plan: Debridement,Dressing change,Patient/family education,Pulsatile lavage with suction Wound Therapy - Frequency: 6X / week Wound Therapy - Follow Up Recommendations: dressing changes by RN  Wound Therapy Goals- Improve the function of patient's integumentary system by progressing the wound(s) through the phases of wound healing (inflammation - proliferation - remodeling) by: Wound Therapy Goals - Improve the function of patient's integumentary system by progressing the wound(s) through the phases of wound healing by: Decrease Necrotic Tissue to: 25% Decrease Necrotic Tissue - Progress: Progressing toward goal Increase Granulation Tissue to: 75% Increase Granulation Tissue - Progress: Progressing toward goal Improve Drainage Characteristics: Min,Serous Improve Drainage Characteristics - Progress: Progressing toward goal Goals/treatment plan/discharge plan were made with and agreed upon by patient/family: No, Patient unable to participate in  goals/treatment/discharge plan and family unavailable Time For Goal Achievement: 7 days Wound Therapy - Potential for Goals: Good  Goals will be updated until maximal potential achieved or discharge criteria met.  Discharge criteria: when goals achieved, discharge from hospital, MD decision/surgical  intervention, no progress towards goals, refusal/missing three consecutive treatments without notification or medical reason.  GP     Charges PT Wound Care Charges $Wound Debridement up to 20 cm: < or equal to 20 cm $ Wound Debridement each add'l 20 sqcm: 2 $PT PLS Gun and Tip: 1 Supply $PT Hydrotherapy Visit: 1 Visit       [name] 12/30/2020, 2:21 PM

## 2020-12-30 NOTE — Progress Notes (Signed)
[facility] ADULT ICU REPLACEMENT PROTOCOL   The patient does apply for the [facility] Adult ICU Electrolyte Replacment Protocol based on the criteria listed below:   1. Is GFR >/= 30 ml/min? Yes.    Patient's GFR today is >60 2. Is SCr </= 2? Yes.   Patient's SCr is 1.18 ml/kg/hr 3. Did SCr increase >/= 0.5 in 24 hours? No. 4. Abnormal electrolyte(s): k 3.2 5. Ordered repletion with: protocol per tube and picc 6. If a panic level lab has been reported, has the CCM MD in charge been notified? No..   Physician:    Markus Daft A 12/30/2020 6:29 AM

## 2020-12-30 NOTE — Progress Notes (Signed)
NAME:  [name], MRN:  [id number], DOB:  September 21, 1960, LOS: 45 ADMISSION DATE:  12/10/2020,   History of Present Illness:  60 year old male with PMH significant for HTN, HLD presented 3/31 to the ER from home with 4-5 day hx of diarrhea and vomiting and acute encephalopathy. Admitted for hypoxemic respiratory failure and septic shock 2/2 E. Coli bacteremia and UTI, and ATN.   Pertinent  Medical History  Prior smoker quit 15 years ago 82-100-pack-year history Sinusitis  Chronic pain syndrome Hypertension Hyperlipidemia Chronic back pain  Significant Hospital Events: Including procedures, antibiotic start and stop dates in addition to other pertinent events   . 03/31 patient was intubated . 03/31 triple-lumen catheter inserted . 4/1 off pressors. E coli bacteremia, e coli uti. Some improvement in pulm opacities on CXR. On rocephin. TTE with LVEF 40-45%, LV global hypokinesis, G2DD, normal RV, dilated aortic root at 25m.   . 4/5 extubated, failed immediately due to mucous plugging identified on bronch after re-intubated. CT ABD with 41mleft ureteral stone with mild edema of left kideny, large areas of consolidation in bilateral lung bases.  . 4/6 broadened to cefepime in light of pneumonia on CT abdomen; urology consulted for distal left ureteral stone, to OR overnight for stent placement, UCx sent.  Versed added for agitation (on fent / precedex) . 4/7 Off vasopressors . 4/11 Vasopressors restarted  . 4/13: Left IJ holiday . 4/15: Right IJ placed  . 4/16: extubated, remained agitated and restless requiring IV Precedex infusion  Interim History / Subjective:  + 1. 2L I/O, Net negative 11 L. Afebrile. [facility] oxygen requirements [facility] and will transition back to [place] today.   Objective   Blood pressure (!) 131/44, pulse (!) 101, temperature 99.5 F (37.5 C), temperature source Rectal, resp. rate (!) 27, height _0  (1.88 m), weight 126.7  kg, SpO2 96 %.    FiO2 (%):  [40 %-80 %] 40 %   Intake/Output Summary (Last 24 hours) at 12/30/2020 0913 Last data filed at 12/30/2020 0900 Gross per 24 hour  Intake 7014.93 ml  Output 5600 ml  Net 1414.93 ml    Filed Weights   12/27/20 0432 12/28/20 0345 12/29/20 0400  Weight: 128.7 kg 127.8 kg 126.7 kg    Examination:  General: Obese adult male, lying in bed  HEENT: small bore ng in place, Dry MM  Neuro: Opens eyes with stimulation, does not follow commands, moves extremities spontaneously, no movement noted to LUE, pupils 3 mm and reactive  PULM: diminished to bases, mild accessory muscle use  GI: Obese, soft, active bowel sounds  Extremities: -edema  Skin: sacral stage II pressure ulcer  Imaging/Labs  4/11 renal USKoreaminimal left hydronephrosis  4/05 Ct abdomen: 92m67mistal ureteral stone with mild edema left kidney  4/01 renal US:[place] 4/12 MRI> 9mm80mbacute/chronic ischemic infarct of R frontal centrum semi ovale. 4/12 echo: limited bubble study, nondiagnostic  4/12 carotid US: [place] mild 1-39% stenosis carotid, unable to evaluate left  4/14 CT abd/pelvis: left ureteral stent in place, distal left stone still present, bibasilar consolidation 4/11: BAL with MSSA  4/15: blood cultures growing MRSE 4/19: Flash pulmonary edema vs aspiration requiring high flow [place]  Resolved Hospital Problem list   Traumatic Urethral Bleeding Septic shock due to E.Coli UTI, E. Coli bacteremia, Pyelonephritis Hypernatremia  Assessment & Plan:   Sepsis with Recurrent Fevers MSSA Pneumonia/MRSE Bacteremia with L IJ/[place] septic thrombophlebitis -S/P Treatment for E.Coli Bacteremia -ECHO  with no evidence of vegetation  Plan -ID Following, Continue IV vancomycin for 4-6 weeks given clot on left treating for endovascular infection  -Heparin drip transition to Lovenox  Acute hypoxic and hypercarbic respiratory failure 2/2 MSSA Pneumonia Extubated on 4/15 Question Underlying OSA Plan -BiPAP  at HS  -Continue to titrate supplemental oxygen for saturation goal >92  QTC prolongation  Plan -Avoid QTC prolonging medications  -Maintain MAG >2.0 and K >4  Acute kidney injury secondary to sepsis, ATN, pyelonephritis > Improving  L Uretal stone w/o hydronephrosis s/p ureteral stent placement 4/6. Repeat US w/mild L hydronephrosis.  Plan -Continue to monitor I/O -Metolazone x 1 for diuresis  Hypernatremia -free water to 125 ml/hr  Acute delirium Acute Encephalopathy, multifactorial  -Continue uremia, hypernatremia, question underlying neuro process  Chronic Pain  Plan -D/C Precedex -Continue Zyrexa 10 mg Daily > change to qHS -Continue oxycodone to 5 mg q6h  Subacute vs chronic infarct to Posterior R frontal centrum semi ovale -- noted on MRI 4/12 after pt had acute LUE weakness. Plan -Continue ASA/statin -PT/OT/SLP   NASH cirrhosis Plan  -Outpatient follow-up with GI  Macrocytic anemia Plan -Trend CBC, transfuse for goal hgb > 7  Diabetes type 2 Plan -Levemir to 35 units twice daily, sliding scale insulin, TF Insulin coverage.  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 -needs follow up with CT as outpatient in 6 months   Sacral Ulcer Plan -WOC following > undergoing PT hydrotherapy   Hypokalemia -Replaced  Best practice (right click and "Reselect all SmartList Selections" daily)  Diet: NPO/speech and swallow evaluation, continue tube feeds Pain/Anxiety/Delirium protocol (if indicated): Precedex infusion with RASS goal -0 DVT prophylaxis: IV heparin infusion >> transition to Lovenox GI prophylaxis: PPI Glucose control:  See above Central venous access:  PICC 4/19 Foley:  Yes, and it is still needed   Mobility:  bed rest  PT consulted: Yes Last date of multidisciplinary goals of care discussion: will update family Code Status:  full code Disposition: ICU  Total critical care time: 35  minutes  Critical care time was exclusive of separately billable procedures and treating other patients.   Critical care was necessary to treat or prevent imminent or life-threatening deterioration.   Critical care was time spent personally by me on the following activities: development of treatment plan with patient and/or surrogate as well as nursing, discussions with consultants, evaluation of patient's response to treatment, examination of patient, obtaining history from patient or surrogate, ordering and performing treatments and interventions, ordering and review of laboratory studies, ordering and review of radiographic studies, pulse oximetry and re-evaluation of patient's condition.   Paulita Fujita, ACNP [facility]

## 2020-12-31 ENCOUNTER — Inpatient Hospital Stay (HOSPITAL_COMMUNITY): Payer: Medicaid Other

## 2020-12-31 DIAGNOSIS — J189 Pneumonia, unspecified organism: Secondary | ICD-10-CM

## 2020-12-31 LAB — CBC
HCT: 32.1 % — ABNORMAL LOW (ref 39.0–52.0)
Hemoglobin: 9.5 g/dL — ABNORMAL LOW (ref 13.0–17.0)
MCH: 30.8 pg (ref 26.0–34.0)
MCHC: 29.6 g/dL — ABNORMAL LOW (ref 30.0–36.0)
MCV: 104.2 fL — ABNORMAL HIGH (ref 80.0–100.0)
Platelets: 208 10*3/uL (ref 150–400)
RBC: 3.08 MIL/uL — ABNORMAL LOW (ref 4.22–5.81)
RDW: 14.1 % (ref 11.5–15.5)
WBC: 10.1 10*3/uL (ref 4.0–10.5)
nRBC: 0 % (ref 0.0–0.2)

## 2020-12-31 LAB — CBC WITH DIFFERENTIAL/PLATELET
Abs Immature Granulocytes: 0.07 10*3/uL (ref 0.00–0.07)
Basophils Absolute: 0 10*3/uL (ref 0.0–0.1)
Basophils Relative: 0 %
Eosinophils Absolute: 0.3 10*3/uL (ref 0.0–0.5)
Eosinophils Relative: 3 %
HCT: 31.9 % — ABNORMAL LOW (ref 39.0–52.0)
Hemoglobin: 9.7 g/dL — ABNORMAL LOW (ref 13.0–17.0)
Immature Granulocytes: 1 %
Lymphocytes Relative: 17 %
Lymphs Abs: 1.8 10*3/uL (ref 0.7–4.0)
MCH: 31.7 pg (ref 26.0–34.0)
MCHC: 30.4 g/dL (ref 30.0–36.0)
MCV: 104.2 fL — ABNORMAL HIGH (ref 80.0–100.0)
Monocytes Absolute: 0.4 10*3/uL (ref 0.1–1.0)
Monocytes Relative: 4 %
Neutro Abs: 7.6 10*3/uL (ref 1.7–7.7)
Neutrophils Relative %: 75 %
Platelets: 216 10*3/uL (ref 150–400)
RBC: 3.06 MIL/uL — ABNORMAL LOW (ref 4.22–5.81)
RDW: 14.1 % (ref 11.5–15.5)
WBC: 10.2 10*3/uL (ref 4.0–10.5)
nRBC: 0 % (ref 0.0–0.2)

## 2020-12-31 LAB — CULTURE, BLOOD (ROUTINE X 2)
Culture: NO GROWTH
Culture: NO GROWTH
Special Requests: ADEQUATE

## 2020-12-31 LAB — BASIC METABOLIC PANEL
Anion gap: 5 (ref 5–15)
BUN: 49 mg/dL — ABNORMAL HIGH (ref 6–20)
CO2: 26 mmol/L (ref 22–32)
Calcium: 8.8 mg/dL — ABNORMAL LOW (ref 8.9–10.3)
Chloride: 117 mmol/L — ABNORMAL HIGH (ref 98–111)
Creatinine, Ser: 1.06 mg/dL (ref 0.61–1.24)
GFR, Estimated: >60 mL/min (ref >60)
Glucose, Bld: 185 mg/dL — ABNORMAL HIGH (ref 70–99)
Potassium: 3.9 mmol/L (ref 3.5–5.1)
Sodium: 148 mmol/L — ABNORMAL HIGH (ref 135–145)

## 2020-12-31 LAB — PHOSPHORUS: Phosphorus: 3.5 mg/dL (ref 2.5–4.6)

## 2020-12-31 LAB — HEPATIC FUNCTION PANEL
ALT: 16 U/L (ref 0–44)
AST: 21 U/L (ref 15–41)
Albumin: 2.3 g/dL — ABNORMAL LOW (ref 3.5–5.0)
Alkaline Phosphatase: 58 U/L (ref 38–126)
Bilirubin, Direct: 0.2 mg/dL (ref 0.0–0.2)
Indirect Bilirubin: 0.3 mg/dL (ref 0.3–0.9)
Total Bilirubin: 0.5 mg/dL (ref 0.3–1.2)
Total Protein: 7.4 g/dL (ref 6.5–8.1)

## 2020-12-31 LAB — GLUCOSE, CAPILLARY
Glucose-Capillary: 108 mg/dL — ABNORMAL HIGH (ref 70–99)
Glucose-Capillary: 144 mg/dL — ABNORMAL HIGH (ref 70–99)
Glucose-Capillary: 149 mg/dL — ABNORMAL HIGH (ref 70–99)
Glucose-Capillary: 159 mg/dL — ABNORMAL HIGH (ref 70–99)
Glucose-Capillary: 92 mg/dL (ref 70–99)

## 2020-12-31 LAB — MAGNESIUM: Magnesium: 2.2 mg/dL (ref 1.7–2.4)

## 2020-12-31 LAB — VANCOMYCIN, TROUGH: Vancomycin Tr: 10 ug/mL — ABNORMAL LOW (ref 15–20)

## 2020-12-31 LAB — VANCOMYCIN, PEAK: Vancomycin Pk: 44 ug/mL — ABNORMAL HIGH (ref 30–40)

## 2020-12-31 MED ORDER — ENOXAPARIN SODIUM 120 MG/0.8ML ~~LOC~~ SOLN
120.0000 mg | Freq: Two times a day (BID) | SUBCUTANEOUS | Status: DC
  Administered 2020-12-31 – 2021-01-18 (×37): 120 mg via SUBCUTANEOUS
  Filled 2020-12-31 (×43): qty 0.8

## 2020-12-31 MED ORDER — AMLODIPINE BESYLATE 5 MG PO TABS
5.0000 mg | ORAL_TABLET | Freq: Every day | ORAL | Status: DC
  Administered 2020-12-31 – 2021-01-04 (×5): 5 mg
  Filled 2020-12-31 (×5): qty 1

## 2020-12-31 MED ORDER — METOLAZONE 2.5 MG PO TABS
5.0000 mg | ORAL_TABLET | Freq: Once | ORAL | Status: AC
  Administered 2020-12-31: 5 mg via ORAL
  Filled 2020-12-31: qty 2

## 2020-12-31 MED ORDER — RESOURCE THICKENUP CLEAR PO POWD
ORAL | Status: DC | PRN
  Filled 2020-12-31: qty 125

## 2020-12-31 MED ORDER — OXYCODONE HCL 5 MG PO TABS
5.0000 mg | ORAL_TABLET | Freq: Four times a day (QID) | ORAL | Status: AC | PRN
  Administered 2021-01-01: 5 mg
  Administered 2021-01-04 – 2021-01-05 (×4): 10 mg
  Filled 2020-12-31 (×2): qty 2
  Filled 2020-12-31: qty 1
  Filled 2020-12-31 (×3): qty 2

## 2020-12-31 MED ORDER — FREE WATER
100.0000 mL | Status: DC
  Administered 2020-12-31 – 2021-01-01 (×23): 100 mL

## 2020-12-31 MED ORDER — JUVEN PO PACK
1.0000 | PACK | Freq: Two times a day (BID) | ORAL | Status: DC
  Administered 2020-12-31 – 2021-01-13 (×27): 1
  Filled 2020-12-31 (×27): qty 1

## 2020-12-31 MED ORDER — VANCOMYCIN HCL 500 MG/100ML IV SOLN
500.0000 mg | Freq: Two times a day (BID) | INTRAVENOUS | Status: DC
  Administered 2021-01-01 – 2021-01-03 (×6): 500 mg via INTRAVENOUS
  Filled 2020-12-31 (×7): qty 100

## 2020-12-31 NOTE — Progress Notes (Signed)
Physical Therapy Wound Treatment Patient Details  Name: [name] MRN: [id number] Date of Birth: [birth year]  Today's Date: 12/31/2020 Time: 1215-1252 Time Calculation (min): 37 min  Subjective  Subjective Assessment Subjective: Pt restless, attempting to roll onto his back Patient and Family Stated Goals: None stated Date of Onset:  (Unknown) Prior Treatments:  (Dressing changes)  Pain Score:  4-6/10 with premedication  Wound Assessment  Pressure Injury 12/21/20 Right Deep Tissue Pressure Injury - Purple or maroon localized area of discolored intact skin or blood-filled blister due to damage of underlying soft tissue from pressure and/or shear. purple with blister area that has torn (Active)  Wound Image   12/25/20 1100  Dressing Type ABD;Barrier Film (skin prep);Gauze (Comment);Normal saline moist dressing;Santyl 12/31/20 1404  Dressing Changed;Clean;Dry;Intact 12/31/20 1404  Dressing Change Frequency Daily 12/31/20 1404  State of Healing Eschar 12/31/20 1404  Site / Wound Assessment Black;Brown;Red;Yellow 12/31/20 1404  % Wound base Red or Granulating 15% 12/31/20 1404  % Wound base Yellow/Fibrinous Exudate 65% 12/31/20 1404  % Wound base Black/Eschar 20% 12/31/20 1404  % Wound base Other/Granulation Tissue (Comment) 0% 12/30/20 1418  Peri-wound Assessment Erythema (blanchable);Excoriated;Intact 12/31/20 1404  Wound Length (cm) 9 cm 12/30/20 1000  Wound Width (cm) 6 cm 12/30/20 1000  Wound Depth (cm) 0.1 cm 12/25/20 1100  Wound Surface Area (cm^2) 54 cm^2 12/30/20 1000  Wound Volume (cm^3) 5.15 cm^3 12/25/20 1100  Tunneling (cm) 0 12/25/20 1100  Undermining (cm) 0 12/25/20 1100  Margins Unattached edges (unapproximated) 12/31/20 1404  Drainage Amount Minimal 12/31/20 1404  Drainage Description Serous;Serosanguineous 12/31/20 1404  Treatment Cleansed;Debridement (Selective);Hydrotherapy (Pulse lavage);Packing (Saline gauze) 12/31/20 1404     Santyl applied to wound bed  prior to applying dressing.  Hydrotherapy Pulsed lavage therapy - wound location: R buttock Pulsed Lavage with Suction (psi): 12 psi Pulsed Lavage with Suction - Normal Saline Used: 1000 mL Pulsed Lavage Tip: Tip with splash shield Selective Debridement Selective Debridement - Location: R buttock Selective Debridement - Tools Used: Forceps,Scalpel Selective Debridement - Tissue Removed: eschar, yellow slough    Wound Assessment and Plan  Wound Therapy - Assess/Plan/Recommendations Wound Therapy - Clinical Statement: Pt with continued restlessness, but improved still somewhat difficult to maintain optimal positioning for sharp debridement. Pt will benefit from hydrotherapy to cleanse wound and decrease amount of necrotic tissue. Wound Therapy - Functional Problem List: Global weakness in the setting of critical illness and extended ICU stay. Factors Delaying/Impairing Wound Healing: Immobility,Multiple medical problems,Polypharmacy,Infection - systemic/local Hydrotherapy Plan: Debridement,Dressing change,Patient/family education,Pulsatile lavage with suction Wound Therapy - Frequency: 6X / week Wound Therapy - Follow Up Recommendations: dressing changes by RN  Wound Therapy Goals- Improve the function of patient's integumentary system by progressing the wound(s) through the phases of wound healing (inflammation - proliferation - remodeling) by: Wound Therapy Goals - Improve the function of patient's integumentary system by progressing the wound(s) through the phases of wound healing by: Decrease Necrotic Tissue to: 25% Decrease Necrotic Tissue - Progress: Progressing toward goal Increase Granulation Tissue to: 75% Increase Granulation Tissue - Progress: Progressing toward goal Improve Drainage Characteristics: Min,Serous Improve Drainage Characteristics - Progress: Progressing toward goal Goals/treatment plan/discharge plan were made with and agreed upon by patient/family: No, Patient  unable to participate in goals/treatment/discharge plan and family unavailable Time For Goal Achievement: 7 days Wound Therapy - Potential for Goals: Good  Goals will be updated until maximal potential achieved or discharge criteria met.  Discharge criteria: when goals achieved, discharge from hospital, MD decision/surgical intervention, no  progress towards goals, refusal/missing three consecutive treatments without notification or medical reason.  GP     Charges PT Wound Care Charges $Wound Debridement up to 20 cm: < or equal to 20 cm $ Wound Debridement each add'l 20 sqcm: 2 $PT PLS Gun and Tip: 1 Supply $PT Hydrotherapy Visit: 1 Visit     12/31/2020  Ginger Carne., PT Acute Rehabilitation Services [phone]  (pager) [phone]  (office)  Tessie Fass Geralyn Figiel 12/31/2020, 2:11 PM

## 2020-12-31 NOTE — Progress Notes (Signed)
Modified Barium Swallow Progress Note  Patient Details  Name: [name] MRN: [id number] Date of Birth: Aug 01, 1961  Today's Date: 12/31/2020  Modified Barium Swallow completed.  Full report located under Chart Review in the Imaging Section.  Brief recommendations include the following:  Clinical Impression  Pt presented with oropharyngeal dysphagia characterized by impaired mastication, reduced lingual retraction, a pharyngeal delay, and reduced anterior laryngeal movement. He demonstrated prolonged mastication with regular texture solids with use of anterior teeth only and a munching pattern. Incomplete epiglottic inversion was often noted with liquids and resulted in reduced airway protection and laryngeal invasion with thin and nectar thick liquids. Mild to moderate vallecular residue was reduced with pt's independent use of secondary swallows. He demonstrated penetration (PAS 3, 5) with intermittent subsequent sensed aspiration (PAS 7) with thin and nectar thick liquids. Depth of penetration was improved to PAS 3 with prompted reduced bolus sizes and prompted coughing was effective in expelling penetration at PAS 3 but not at PAS 5. Pt exhibtied difficulty consistently demonstrating compensatory strategies. A dysphagia 2 diet with honey thick liquids will be initiated at this time. SLP will follow to assess tolerance and for dysphagia treatment.   Swallow Evaluation Recommendations       SLP Diet Recommendations: Dysphagia 2 (Fine chop) solids;Honey thick liquids   Liquid Administration via: Cup;Straw   Medication Administration: Via alternative means (whole with liquids if Cortrak removed/)   Supervision: Full supervision/cueing for compensatory strategies;Full assist for feeding   Compensations: Slow rate;Small sips/bites;Multiple dry swallows after each bite/sip;Follow solids with liquid   Postural Changes: Seated upright at 90 degrees;Remain semi-upright after after feeds/meals  (Comment)   Oral Care Recommendations: Oral care BID;Staff/trained caregiver to provide oral care   Other Recommendations: Order thickener from pharmacy;Prohibited food (jello, ice cream, thin soups)   Teiana Hajduk I. Vear Clock, MS, CCC-SLP Acute Rehabilitation Services Office number [phone] Pager [phone]  Scheryl Marten 12/31/2020,1:32 PM

## 2020-12-31 NOTE — Progress Notes (Addendum)
[facility]    Date of Admission:  12/10/2020     ID: [name] is a 60 y.o. male with  Active Problems:   Acute respiratory failure (HCC)   AKI (acute kidney injury) (HCC)   Bacteremia due to Escherichia coli   Ureteral stone   Pyelonephritis   Cerebral thrombosis with cerebral infarction   Staphylococcus epidermidis bacteremia    Subjective: He remains afebrile, however he is on cooling blanket. His WBC increased from 8 to 10K. He is more alert, following commands. Oxygen requirement down to 8L  Medications:  . amLODipine  5 mg Per Tube Daily  . aspirin  81 mg Per Tube Daily  . atorvastatin  40 mg Per Tube Daily  . chlorhexidine  15 mL Mouth Rinse BID  . Chlorhexidine Gluconate Cloth  6 each Topical Daily  . collagenase   Topical Daily  . docusate  50 mg Per Tube Daily  . enoxaparin (LOVENOX) injection  120 mg Subcutaneous Q12H  . feeding supplement (PROSource TF)  90 mL Per Tube QID  . free water  100 mL Per Tube Q1H  . insulin aspart  0-20 Units Subcutaneous Q4H  . insulin aspart  8 Units Subcutaneous Q4H  . insulin detemir  35 Units Subcutaneous BID  . mouth rinse  15 mL Mouth Rinse q12n4p  . OLANZapine  10 mg Per Tube QHS  . oxyCODONE  5 mg Per Tube Q6H  . polyethylene glycol  17 g Per Tube Daily  . sodium chloride flush  10-40 mL Intracatheter Q12H  . sodium chloride flush  10-40 mL Intracatheter Q12H  . vitamin B-12  100 mcg Per Tube Daily    Objective: Vital signs in last 24 hours: Temp:  [98.6 F (37 C)-100.3 F (37.9 C)] 98.6 F (37 C) (04/21 1137) Pulse Rate:  [112-123] 116 (04/21 1137) Resp:  [17-35] 29 (04/21 1137) BP: (112-165)/(55-98) 115/75 (04/21 1100) SpO2:  [92 %-100 %] 97 % (04/21 1137) Physical Exam  Constitutional: He is oriented to person, place He appears well-developed and well-nourished. No distress.  HENT: bridled NG Mouth/Throat: Oropharynx is clear and moist. No oropharyngeal exudate.   Cardiovascular: tachy, regular rhythm and normal heart sounds. Exam reveals no gallop and no friction rub.  No murmur heard.  Pulmonary/Chest: Effort normal and breath sounds normal. No respiratory distress. He has no wheezes.  Abdominal: Soft. Bowel sounds are normal. He exhibits no distension. There is no tenderness. Rectal tube in place Neurological: He is alert and oriented to person, place, and time. Moves all extremities except LUE Skin: Skin is warm and dry. No rash noted. No erythema. Buttock decub is covered Psychiatric: He has a normal mood and affect. His behavior is normal.     Lab Results Recent Labs    12/30/20 0408 12/30/20 1746 12/31/20 0525  WBC 8.0  --  10.2  10.1  HGB 8.7*  --  9.7*  9.5*  HCT 29.6*  --  31.9*  32.1*  NA 153* 151* 148*  K 3.2* 4.1 3.9  CL 119* 118* 117*  CO2 26 24 26   BUN 59* 50* 49*  CREATININE 1.18 1.09 1.06   Microbiology: 4/16 blood cx NGTD 4/13 (labeleed 4/15) blood cx MRSE 4/10 respiratory cx MSSA Studies/Results: No results found.   Assessment/Plan: aki = improving, with this normalizing back to his baseline, will dose adjust his vancomycin  MRSE bacteremia with [place]/IJ septic thrombophlebitis = plan for 6 wk of vancomcyin  for now, currently day 6. Originally thought to be associated with central line access, which has now been removed. Had TTE which excluded vegetation  Leukocytosis and intermittent fever = concern that there is still uncovered source causing fevers other than septic thrombophlebitis - potential sources include sacral decub. Has MSSA pneumonia being treated with vanco but Would still like to get sputum culture as well.   Would add gram negative coverage, such as ceftriaxone 2gm iv, if still requiring cooling blanket.  Hypernatremia = resolving  Encephalopathy = slowly improving, today answering some questions and following commands, reassuring overall.  [facility] Infectious  Dise[facility] Cell: [phone] Pager: [phone]  12/31/2020, 11:54 AM

## 2020-12-31 NOTE — Progress Notes (Signed)
  Speech Language Pathology Treatment: Dysphagia  Patient Details Name: Mandel Seiden MRN: [id number] DOB: 05-21-1961 Today's Date: 12/31/2020 Time: 0932-3557 SLP Time Calculation (min) (ACUTE ONLY): 10.85 min  Assessment / Plan / Recommendation Clinical Impression  Pt was seen for dysphagia treatment. He was alert and cooperative during the session, but confused and attempting to call dogs that were not in the room. No s/sx of aspiration were noted during this session with thin liquids, regular texture solids, or with purees. Secondary swallows were inconsistently observed with puree solids and mastication was mildly prolonged with regular texture solids. Considering prolonged intubation, a modified barium swallow study is still recommended to further assess swallow physiology and it will be scheduled for today since pt is now on 8L O2.    HPI HPI: Pt is a 60 year old male who presented 3/31 to the ED with 4-5 day hx of diarrhea and vomiting. He developed confusion and became belligerent at home. Initial ED evaluation found him to be hypotensive, combative, confused, and he was intubated.  He was noted to have mottling over his trunk and extremities and was  admitted for hypoxemic respiratory failure and septic shock 2/2 E. Coli bacteremia and UTI, and ATN. ETT 3/31-4/5; reintubated 4/5-4/16. 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 CXR 4/14: Worsening pneumonia in both lungs, most confluent at the RIGHT  lung base.      SLP Plan  MBS       Recommendations  Diet recommendations: NPO (ice chips following oral care) Medication Administration: Via alternative means                Oral Care Recommendations: Oral care QID;Staff/trained caregiver to provide oral care Follow up Recommendations:  (TBD) SLP Visit Diagnosis: Dysphagia, unspecified (R13.10) Plan: MBS       Kanetra Ho I. Vear Clock, MS,  CCC-SLP Acute Rehabilitation Services Office number [phone] Pager [phone]                Scheryl Marten 12/31/2020, 9:01 AM

## 2020-12-31 NOTE — Progress Notes (Signed)
Pharmacy Antibiotic Note  [name] is a 59 y.o. male admitted on 12/10/2020 with hypoxemic respiratory failure and septic shock 2/2 E. Coli bacteremia and UTI, and ATN, found to have MRSE bacteremia.  Pharmacy has been consulted for vancomycin dosing.  Scr has improved from 1.5 to 1. Vancomycin peak 44, trough 10. Calculated AUC is therapeutic at 578 (goal 400-600) on 1000 mg q24h, so will continue current total daily dose but increase dosing interval = 500 mg q12h (estimated AUC 587) starting tomorrow to help increase trough. Expect Scr to continue to improve. Continues with borderline low grade fever.   Plan: Adjust vancomycin to 500 mg q12h (eAUC 587) Monitor Scr, vancomycin levels as indicated  Height: _0  (188 cm) Weight: 126.7 kg (279 lb 5.2 oz) IBW/kg (Calculated) : 82.2  Temp (24hrs), Avg:100.1 F (37.8 C), Min:99.8 F (37.7 C), Max:101 F (38.3 C)  Recent Labs  Lab 12/27/20 0428 12/27/20 0900 12/28/20 0459 12/28/20 1655 12/29/20 0412 12/29/20 1600 12/30/20 0408 12/30/20 1746 12/31/20 0525 12/31/20 0528 12/31/20 0759  WBC 7.6  --  8.1  --  8.0  --  8.0  --  10.2  10.1  --   --   CREATININE 1.71*  --  1.79*   < > 1.50* 1.52* 1.18 1.09 1.06  --   --   VANCOTROUGH  --   --  19  --   --   --   --   --   --  10*  --   VANCOPEAK  --  40  --   --   --   --   --   --   --   --  44*   < > = values in this interval not displayed.    Estimated Creatinine Clearance: 107.4 mL/min (by C-G formula based on SCr of 1.06 mg/dL).    Allergies  Allergen Reactions  . Ibuprofen Other (See Comments)    Blood in the stool.  . Metformin Other (See Comments)    Tremors, muscles  locking up, unable to control extremities    Antimicrobials this admission: Zosyn 3/31>>3/31 Rocephin 3/31>> 4/4; 4/14>>4/15 Vanc 4/14 >> Ancef 4/4 >> 4/6 Cefepime 4/6 >> 4/7 CTX 4/7 >> 4/11  Dose adjustments this admission: 4/17 VP 40; VT 19 > calc AUC 705 on 1500 q24h > adj to 1000 q24h (eAUC  470) 4/21 VP 44; VT 10 > calc AUC 578 on 1000 q24h > adj to 500 mg q12h (eAUC 587)  Microbiology results: 3/31 BCx Ecoli (pan sens) 3/31 UCx Ecoli (pan sens) 4/5 RCx (TA) >> NOF 4/5 BAL cx >> NOF 4/10 resp: mssa 4/13 Bcx - MRSE 4/16 bcx: ngtd   Thank you for allowing pharmacy to be a part of this patient's care.  Rebbeca Paul, PharmD PGY1 Pharmacy Resident 12/31/2020 10:31 AM  Please check [email or web address] for unit-specific pharmacy phone numbers.

## 2020-12-31 NOTE — Progress Notes (Signed)
NAME:  [name], MRN:  [id number], DOB:  Nov 25, 1960, LOS: 10 ADMISSION DATE:  12/10/2020,   History of Present Illness:  60 year old male with PMH significant for HTN, HLD presented 3/31 to the ER from home with 4-5 day hx of diarrhea and vomiting and acute encephalopathy. Admitted for hypoxemic respiratory failure and septic shock 2/2 E. Coli bacteremia and UTI, and ATN.   Pertinent  Medical History  Prior smoker quit 15 years ago 82-100-pack-year history Sinusitis  Chronic pain syndrome Hypertension Hyperlipidemia Chronic back pain  Significant Hospital Events: Including procedures, antibiotic start and stop dates in addition to other pertinent events   . 03/31 patient was intubated . 03/31 triple-lumen catheter inserted . 4/1 off pressors. E coli bacteremia, e coli uti. Some improvement in pulm opacities on CXR. On rocephin. TTE with LVEF 40-45%, LV global hypokinesis, G2DD, normal RV, dilated aortic root at 29m.   . 4/5 extubated, failed immediately due to mucous plugging identified on bronch after re-intubated. CT ABD with 4721mleft ureteral stone with mild edema of left kideny, large areas of consolidation in bilateral lung bases.  . 4/6 broadened to cefepime in light of pneumonia on CT abdomen; urology consulted for distal left ureteral stone, to OR overnight for stent placement, UCx sent.  Versed added for agitation (on fent / precedex) . 4/7 Off vasopressors . 4/11 Vasopressors restarted  . 4/13: Left IJ holiday . 4/15: Right IJ placed  . 4/16: extubated, remained agitated and restless requiring IV Precedex infusion  Interim History / Subjective:  No acute events overnight, - 31 ml I/O, net neg  9 L. Oxygen requirements improving, down to 8 L/min. Patient alert and oriented to self and place, states he feels better.   Objective   Blood pressure 133/74, pulse (!) 116, temperature 99.8 F (37.7 C), temperature source Axillary, resp. rate (!) 27, height _0  (1.88 m),  weight 126.7 kg, SpO2 96 %.    FiO2 (%):  [40 %] 40 %   Intake/Output Summary (Last 24 hours) at 12/31/2020 0831 Last data filed at 12/31/2020 0800 Gross per 24 hour  Intake 3717.91 ml  Output 3676 ml  Net 41.91 ml    Filed Weights   12/27/20 0432 12/28/20 0345 12/29/20 0400  Weight: 128.7 kg 127.8 kg 126.7 kg    Examination:  General: Obese adult male, lying in bed  HEENT: small bore ng in place, Dry MM  Neuro: Opens eyes to voice, follows commands, oriented to self and place PULM: diminished to bases  GI: Obese, soft, active bowel sounds  Extremities: No edema  Skin: sacral stage II pressure ulcer  Imaging/Labs  4/11 renal USKoreaminimal left hydronephrosis  4/05 Ct abdomen: 21m91mistal ureteral stone with mild edema left kidney  4/01 renal US:[place] 4/12 MRI> 9mm59mbacute/chronic ischemic infarct of R frontal centrum semi ovale. 4/12 echo: limited bubble study, nondiagnostic  4/12 carotid US: [place] mild 1-39% stenosis carotid, unable to evaluate left  4/14 CT abd/pelvis: left ureteral stent in place, distal left stone still present, bibasilar consolidation 4/11: BAL with MSSA  4/15: blood cultures growing MRSE 4/19: Flash pulmonary edema vs aspiration requiring high flow [place], PICC line placed  Resolved Hospital Problem list   Traumatic Urethral Bleeding Septic shock due to E.Coli UTI, E. Coli bacteremia, Pyelonephritis Hypernatremia  Assessment & Plan:   Sepsis with Recurrent Fevers MSSA Pneumonia/MRSE Bacteremia with L IJ/[place] septic thrombophlebitis -S/P Treatment for E.Coli Bacteremia -ECHO with no evidence of vegetation  Plan -  ID Following, Continue IV vancomycin for 4-6 weeks given clot on left treating for endovascular infection  -Lovenox  Acute hypoxic and hypercarbic respiratory failure 2/2 MSSA Pneumonia Extubated on 4/15 Question Underlying OSA Plan -BiPAP at HS  -Continue to titrate supplemental oxygen for saturation goal >92  QTC prolongation   Plan -Avoid QTC prolonging medications  -Maintain MAG >2.0 and K >4  Acute kidney injury secondary to sepsis, ATN, pyelonephritis > Improving  L Uretal stone w/o hydronephrosis s/p ureteral stent placement 4/6. Repeat US w/mild L hydronephrosis.  Plan -Continue to monitor I/O -Metolazone x 1 today  Hypernatremia -free water to 100 ml/hr  Acute delirium Acute Encephalopathy, multifactorial  -Continue uremia, hypernatremia, question underlying neuro process  Chronic Pain  Plan -Continue Zyprexa 10 mg q HS > end date 4/23.  -Continue oxycodone to 5 mg q6h  Subacute vs chronic infarct to Posterior R frontal centrum semi ovale -- noted on MRI 4/12 after pt had acute LUE weakness. Plan -Continue ASA/statin -PT/OT/SLP   NASH cirrhosis Plan  -Outpatient follow-up with GI  Macrocytic anemia Plan -Trend CBC, transfuse for goal hgb > 7  Diabetes type 2 Plan -Levemir to 35 units twice daily, sliding scale insulin, TF Insulin coverage.  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 Will need additional pain management while receiving   Hypertension -Start Norvasc  Best practice (right click and "Reselect all SmartList Selections" daily)  Diet: NPO/speech following, continue tube feeds Pain/Anxiety/Delirium protocol (if indicated): scheduled pain medicine DVT prophylaxis: Lovenox GI prophylaxis: PPI Glucose control:  See above Central venous access:  PICC 4/19 Foley:  Yes, and it is still needed   Mobility:  bed rest  PT consulted: Yes Last date of multidisciplinary goals of care discussion: will update family Code Status:  full code Disposition: Stable to transfer to progressive care.    Paulita Fujita, ACNP [facility]

## 2021-01-01 ENCOUNTER — Inpatient Hospital Stay (HOSPITAL_COMMUNITY): Payer: Medicaid Other

## 2021-01-01 LAB — BASIC METABOLIC PANEL
Anion gap: 7 (ref 5–15)
BUN: 52 mg/dL — ABNORMAL HIGH (ref 6–20)
CO2: 28 mmol/L (ref 22–32)
Calcium: 8.9 mg/dL (ref 8.9–10.3)
Chloride: 111 mmol/L (ref 98–111)
Creatinine, Ser: 1.16 mg/dL (ref 0.61–1.24)
GFR, Estimated: >60 mL/min (ref >60)
Glucose, Bld: 188 mg/dL — ABNORMAL HIGH (ref 70–99)
Potassium: 4 mmol/L (ref 3.5–5.1)
Sodium: 146 mmol/L — ABNORMAL HIGH (ref 135–145)

## 2021-01-01 LAB — CBC
HCT: 28.5 % — ABNORMAL LOW (ref 39.0–52.0)
Hemoglobin: 8.9 g/dL — ABNORMAL LOW (ref 13.0–17.0)
MCH: 31.7 pg (ref 26.0–34.0)
MCHC: 31.2 g/dL (ref 30.0–36.0)
MCV: 101.4 fL — ABNORMAL HIGH (ref 80.0–100.0)
Platelets: 180 10*3/uL (ref 150–400)
RBC: 2.81 MIL/uL — ABNORMAL LOW (ref 4.22–5.81)
RDW: 13.8 % (ref 11.5–15.5)
WBC: 9.9 10*3/uL (ref 4.0–10.5)
nRBC: 0 % (ref 0.0–0.2)

## 2021-01-01 LAB — GLUCOSE, CAPILLARY
Glucose-Capillary: 119 mg/dL — ABNORMAL HIGH (ref 70–99)
Glucose-Capillary: 122 mg/dL — ABNORMAL HIGH (ref 70–99)
Glucose-Capillary: 141 mg/dL — ABNORMAL HIGH (ref 70–99)
Glucose-Capillary: 142 mg/dL — ABNORMAL HIGH (ref 70–99)
Glucose-Capillary: 144 mg/dL — ABNORMAL HIGH (ref 70–99)
Glucose-Capillary: 154 mg/dL — ABNORMAL HIGH (ref 70–99)
Glucose-Capillary: 207 mg/dL — ABNORMAL HIGH (ref 70–99)

## 2021-01-01 MED ORDER — FREE WATER
200.0000 mL | Status: DC
  Administered 2021-01-01 – 2021-01-07 (×36): 200 mL

## 2021-01-01 MED ORDER — LACTATED RINGERS IV BOLUS
500.0000 mL | Freq: Once | INTRAVENOUS | Status: AC
  Administered 2021-01-01: 500 mL via INTRAVENOUS

## 2021-01-01 NOTE — Progress Notes (Signed)
Physical Therapy Wound Treatment Patient Details  Name: [name] MRN: [id number] Date of Birth: 1961/04/12  Today's Date: 01/01/2021 Time: 4742-5956 Time Calculation (min): 36 min  Subjective  Subjective Assessment Subjective: Restless and intermittently answering yes/no questions Patient and Family Stated Goals: None stated Date of Onset:  (Unknown) Prior Treatments:  (Dressing changes)  Pain Score:  Pt tolerated treatment well with premedication. No obvious signs of pain or discomfort throughout session.   Wound Assessment  Pressure Injury 12/21/20 Right Deep Tissue Pressure Injury - Purple or maroon localized area of discolored intact skin or blood-filled blister due to damage of underlying soft tissue from pressure and/or shear. purple with blister area that has torn (Active)  Wound Image   01/01/21 1000  Dressing Type ABD;Barrier Film (skin prep);Gauze (Comment);Moist to dry 01/01/21 1000  Dressing Changed;Clean;Dry;Intact 01/01/21 1000  Dressing Change Frequency Monday, Wednesday, Friday 01/01/21 1000  State of Healing Eschar 01/01/21 1000  Site / Wound Assessment Black;Red;Yellow 01/01/21 1000  % Wound base Red or Granulating 15% 01/01/21 1000  % Wound base Yellow/Fibrinous Exudate 70% 01/01/21 1000  % Wound base Black/Eschar 15% 01/01/21 1000  % Wound base Other/Granulation Tissue (Comment) 0% 01/01/21 1000  Peri-wound Assessment Erythema (blanchable);Bleeding;Maceration 01/01/21 1000  Wound Length (cm) 11 cm 01/01/21 1000  Wound Width (cm) 6.5 cm 01/01/21 1000  Wound Depth (cm) 2.5 cm 01/01/21 1000  Wound Surface Area (cm^2) 71.5 cm^2 01/01/21 1000  Wound Volume (cm^3) 178.75 cm^3 01/01/21 1000  Tunneling (cm) 0 01/01/21 1000  Undermining (cm) 0 01/01/21 1000  Margins Unattached edges (unapproximated) 01/01/21 1000  Drainage Amount Moderate 01/01/21 1000  Drainage Description Serosanguineous 01/01/21 1000  Treatment Debridement (Selective);Hydrotherapy (Pulse  lavage);Packing (Saline gauze) 01/01/21 1000      Hydrotherapy Pulsed lavage therapy - wound location: R buttock Pulsed Lavage with Suction (psi): 12 psi Pulsed Lavage with Suction - Normal Saline Used: 1000 mL Pulsed Lavage Tip: Tip with splash shield Selective Debridement Selective Debridement - Location: R buttock Selective Debridement - Tools Used: Forceps,Scalpel,Scissors Selective Debridement - Tissue Removed: eschar, yellow slough    Wound Assessment and Plan  Wound Therapy - Assess/Plan/Recommendations Wound Therapy - Clinical Statement: Pt with continued restlessness and it is still somewhat difficult to maintain optimal positioning for sharp debridement. Most of the tissue at the wound bed appears to be adipose (a mixture of non-viable and viable). This wound is appropriate for 3x/week intervention at this time, and will follow-up on Monday to continue with Hydrotherapy POC for selective removal of unviable tissue, to decrease bioburden, and promote wound bed healing. Wound Therapy - Functional Problem List: Global weakness in the setting of critical illness and extended ICU stay. Factors Delaying/Impairing Wound Healing: Immobility,Multiple medical problems,Polypharmacy,Infection - systemic/local Hydrotherapy Plan: Dressing change,Patient/family education,Pulsatile lavage with suction,Debridement Wound Therapy - Frequency: 3X / week (hydrotherapy MWF with nursing to continue with daily dressing changes in between.) Wound Therapy - Follow Up Recommendations: dressing changes by RN  Wound Therapy Goals- Improve the function of patient's integumentary system by progressing the wound(s) through the phases of wound healing (inflammation - proliferation - remodeling) by: Wound Therapy Goals - Improve the function of patient's integumentary system by progressing the wound(s) through the phases of wound healing by: Decrease Necrotic Tissue to: 25% Decrease Necrotic Tissue - Progress:  Progressing toward goal Increase Granulation Tissue to: 75% Increase Granulation Tissue - Progress: Progressing toward goal Improve Drainage Characteristics: Min,Serous Improve Drainage Characteristics - Progress: Progressing toward goal Goals/treatment plan/discharge plan were made with and  agreed upon by patient/family: No, Patient unable to participate in goals/treatment/discharge plan and family unavailable Time For Goal Achievement: 7 days Wound Therapy - Potential for Goals: Good  Goals will be updated until maximal potential achieved or discharge criteria met.  Discharge criteria: when goals achieved, discharge from hospital, MD decision/surgical intervention, no progress towards goals, refusal/missing three consecutive treatments without notification or medical reason.  GP     Charges PT Wound Care Charges $Wound Debridement up to 20 cm: < or equal to 20 cm $ Wound Debridement each add'l 20 sqcm: 2 $PT PLS Gun and Tip: 1 Supply $PT Hydrotherapy Visit: 1 Visit       Thelma Comp 01/01/2021, 11:59 AM   Rolinda Roan, PT, DPT Acute Rehabilitation Services Pager: [phone] Office: [phone]

## 2021-01-01 NOTE — Progress Notes (Addendum)
Physical Therapy Treatment Patient Details Name: Jhony Antrim MRN: [id number] DOB: Dec 16, 1960 Today's Date: 01/01/2021    History of Present Illness 60 year old white male who presented to the emergency room from home 12/10/20.  Pt had 4-5 days of N/V/D. Pt became more confused and at times belligerent. Admitted for hypoxemic respiratory failure and septic shock 2/2 E. Coli bacteremia and UTI and was intubated. Extubated 4/16, now on cortrak    PT Comments    Pt was seen for mobility on side of bed with pt initially attempting to help and at times resisting the work to side of bed.  Pt is able to sit up but then stops assisting and leans to L side on elevated HOB.  Requires max assist OOB and fairly dependent to get back to bed.  Requires 2-3 for scooting up the bed due to cortrak being a hindrance, and not being able to get bed lowered.  Pt is a candidate for maximove to chair if getting up but unsure if he is able to do this with his level of skin breakdown and restless behavior.  Follow along for acute PT goals, working toward more active contribution to mobility.   Follow Up Recommendations  LTACH     Equipment Recommendations  None recommended by PT    Recommendations for Other Services       Precautions / Restrictions Precautions Precautions: Fall;Other (comment) Precaution Comments: cortrak, rectal tube, foley Restrictions Weight Bearing Restrictions: No    Mobility  Bed Mobility Overal bed mobility: Needs Assistance Bed Mobility: Supine to Sit;Sit to Supine;Rolling Rolling: Max assist   Supine to sit: Total assist;+2 for physical assistance;+2 for safety/equipment;HOB elevated Sit to supine: Max assist;+2 for physical assistance;+2 for safety/equipment;HOB elevated   General bed mobility comments: worked on sitting side of bed with some ability to assist to partially sit, then legs over side and could not stay up long.  Pt rapidly fatigues    Transfers                  General transfer comment: unable to stand  Ambulation/Gait             General Gait Details: unable   Stairs             Wheelchair Mobility    Modified Rankin (Stroke Patients Only)       Balance Overall balance assessment: Needs assistance Sitting-balance support: Single extremity supported Sitting balance-Leahy Scale: Poor Sitting balance - Comments: actively resisting at times                                    Cognition Arousal/Alertness: Awake/alert Behavior During Therapy: Impulsive;Restless Overall Cognitive Status: Difficult to assess                                 General Comments: pt is responding to some questions, but has trouble with voicing answers due to previous intubation      Exercises      General Comments General comments (skin integrity, edema, etc.): Pt was seen for mobility and noted his struggle to help get OOB and to do motor planning.  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   PT Goals (current goals can now be found in the care plan section) Acute Rehab PT Goals Patient Stated Goal: unable, but does some yes no nodding PT Goal Formulation: Patient unable to participate in goal setting Time For Goal Achievement: 01/15/21 Potential to Achieve Goals: Fair    Frequency    Min 3X/week      PT Plan      Co-evaluation              AM-PAC PT "6 Clicks" Mobility   Outcome Measure  Help needed turning from your back to your side while in a flat bed without using bedrails?: A Lot Help needed moving from lying on your back to sitting on the  side of a flat bed without using bedrails?: A Lot Help needed moving to and from a bed to a chair (including a wheelchair)?: Total Help needed standing up from a chair using your arms (e.g., wheelchair or bedside chair)?: Total Help needed to walk in hospital room?: Total Help needed climbing 3-5 steps with a railing? : Total 6 Click Score: 8    End of Session Equipment Utilized During Treatment: Oxygen Activity Tolerance: Patient limited by fatigue;Other (comment) (cognition, motor planning) Patient left: in bed;with bed alarm set;with family/visitor present Nurse Communication: Mobility status PT Visit Diagnosis: Muscle weakness (generalized) (M62.81);Difficulty in walking, not elsewhere classified (R26.2);Other abnormalities of gait and mobility (R26.89)     Time: 7616-0737 PT Time Calculation (min) (ACUTE ONLY): 26 min  Charges:  $Therapeutic Activity: 8-22 mins                   Ivar Drape 01/01/2021, 1:02 PM Samul Dada, PT MS Acute Rehab Dept. Number: [facility] [id number] and [facility] [phone]

## 2021-01-01 NOTE — Progress Notes (Addendum)
Subjective: Patient is confused and restrained   Antibiotics:  Anti-infectives (From admission, onward)   Start     Dose/Rate Route Frequency Ordered Stop   01/01/21 0600  vancomycin (VANCOREADY) IVPB 500 mg/100 mL        500 mg 100 mL/hr over 60 Minutes Intravenous Every 12 hours 12/31/20 1036     12/29/20 0600  vancomycin (VANCOREADY) IVPB 1000 mg/200 mL  Status:  Discontinued        1,000 mg 200 mL/hr over 60 Minutes Intravenous Every 24 hours 12/28/20 0713 12/31/20 1036   12/25/20 0600  vancomycin (VANCOREADY) IVPB 1500 mg/300 mL  Status:  Discontinued        1,500 mg 150 mL/hr over 120 Minutes Intravenous Every 24 hours 12/24/20 1840 12/28/20 0713   12/24/20 1915  vancomycin (VANCOCIN) 2,500 mg in sodium chloride 0.9 % 500 mL IVPB        2,500 mg 250 mL/hr over 120 Minutes Intravenous  Once 12/24/20 1820 12/24/20 2147   12/24/20 1000  cefTRIAXone (ROCEPHIN) 2 g in sodium chloride 0.9 % 100 mL IVPB  Status:  Discontinued        2 g 200 mL/hr over 30 Minutes Intravenous Every 24 hours 12/24/20 0856 12/25/20 1607   12/17/20 2200  cefTRIAXone (ROCEPHIN) 2 g in sodium chloride 0.9 % 100 mL IVPB        2 g 200 mL/hr over 30 Minutes Intravenous Every 24 hours 12/17/20 1123 12/21/20 2135   12/16/20 1100  ceFEPIme (MAXIPIME) 2 g in sodium chloride 0.9 % 100 mL IVPB  Status:  Discontinued        2 g 200 mL/hr over 30 Minutes Intravenous Every 12 hours 12/16/20 0946 12/17/20 1123   12/14/20 1400  ceFAZolin (ANCEF) IVPB 2g/100 mL premix  Status:  Discontinued        2 g 200 mL/hr over 30 Minutes Intravenous Every 8 hours 12/14/20 1223 12/16/20 0946   12/10/20 1530  cefTRIAXone (ROCEPHIN) 2 g in sodium chloride 0.9 % 100 mL IVPB  Status:  Discontinued        2 g 200 mL/hr over 30 Minutes Intravenous Every 24 hours 12/10/20 1436 12/14/20 1223   12/10/20 1200  piperacillin-tazobactam (ZOSYN) IVPB 2.25 g  Status:  Discontinued        2.25 g 100 mL/hr over 30 Minutes Intravenous  Every 6 hours 12/10/20 0740 12/10/20 1436   12/10/20 0600  vancomycin (VANCOREADY) IVPB 1500 mg/300 mL        1,500 mg 150 mL/hr over 120 Minutes Intravenous STAT 12/10/20 0546 12/10/20 0821   12/10/20 0400  vancomycin (VANCOREADY) IVPB 1000 mg/200 mL        1,000 mg 200 mL/hr over 60 Minutes Intravenous  Once 12/10/20 0350 12/10/20 0520   12/10/20 0400  piperacillin-tazobactam (ZOSYN) IVPB 3.375 g        3.375 g 12.5 mL/hr over 240 Minutes Intravenous  Once 12/10/20 0350 12/10/20 0450      Medications: Scheduled Meds: . amLODipine  5 mg Per Tube Daily  . aspirin  81 mg Per Tube Daily  . atorvastatin  40 mg Per Tube Daily  . chlorhexidine  15 mL Mouth Rinse BID  . Chlorhexidine Gluconate Cloth  6 each Topical Daily  . collagenase   Topical Daily  . docusate  50 mg Per Tube Daily  . enoxaparin (LOVENOX) injection  120 mg Subcutaneous Q12H  . feeding supplement (PROSource TF)  90  mL Per Tube QID  . free water  200 mL Per Tube Q4H  . insulin aspart  0-20 Units Subcutaneous Q4H  . insulin aspart  8 Units Subcutaneous Q4H  . insulin detemir  35 Units Subcutaneous BID  . mouth rinse  15 mL Mouth Rinse q12n4p  . nutrition supplement (JUVEN)  1 packet Per Tube BID BM  . OLANZapine  10 mg Per Tube QHS  . oxyCODONE  5 mg Per Tube Q6H  . polyethylene glycol  17 g Per Tube Daily  . sodium chloride flush  10-40 mL Intracatheter Q12H  . sodium chloride flush  10-40 mL Intracatheter Q12H  . vitamin B-12  100 mcg Per Tube Daily   Continuous Infusions: . sodium chloride Stopped (12/31/20 1020)  . feeding supplement (OSMOLITE 1.2 CAL) 1,000 mL (01/01/21 0925)  . vancomycin 500 mg (01/01/21 0620)   PRN Meds:.sodium chloride, acetaminophen (TYLENOL) oral liquid 160 mg/5 mL, acetaminophen, fentaNYL (SUBLIMAZE) injection, hydrALAZINE, ondansetron (ZOFRAN) IV, oxyCODONE, Resource ThickenUp Clear, sodium chloride flush, sodium chloride flush    Objective: Weight change:   Intake/Output  Summary (Last 24 hours) at 01/01/2021 1242 Last data filed at 01/01/2021 0647 Gross per 24 hour  Intake 823.02 ml  Output 4000 ml  Net -3176.98 ml   Blood pressure (!) 160/69, pulse (!) 118, temperature 98.6 F (37 C), temperature source Oral, resp. rate 20, height _0  (1.88 m), weight 126.7 kg, SpO2 100 %. Temp:  [97.6 F (36.4 C)-100 F (37.8 C)] 98.6 F (37 C) (04/22 0826) Pulse Rate:  [100-131] 118 (04/22 0826) Resp:  [20-32] 20 (04/22 0826) BP: (130-160)/(62-82) 160/69 (04/22 0826) SpO2:  [96 %-100 %] 100 % (04/22 0826)  Physical Exam: Physical Exam Constitutional:      Appearance: He is well-developed.  HENT:     Head: Normocephalic and atraumatic.  Eyes:     Conjunctiva/sclera: Conjunctivae normal.  Cardiovascular:     Rate and Rhythm: Normal rate and regular rhythm.     Heart sounds: No murmur heard. No friction rub.  Pulmonary:     Effort: Pulmonary effort is normal. No respiratory distress.     Breath sounds: No stridor. No wheezing or rhonchi.  Abdominal:     General: There is no distension.     Palpations: Abdomen is soft.  Musculoskeletal:        General: Normal range of motion.     Cervical back: Normal range of motion and neck supple.  Skin:    General: Skin is warm and dry.     Findings: No erythema or rash.  Neurological:     Mental Status: He is alert. He is disoriented.      CBC:    BMET Recent Labs    12/31/20 0525 01/01/21 0230  NA 148* 146*  K 3.9 4.0  CL 117* 111  CO2 26 28  GLUCOSE 185* 188*  BUN 49* 52*  CREATININE 1.06 1.16  CALCIUM 8.8* 8.9     Liver Panel  Recent Labs    12/31/20 1151  PROT 7.4  ALBUMIN 2.3*  AST 21  ALT 16  ALKPHOS 58  BILITOT 0.5  BILIDIR 0.2  IBILI 0.3       Sedimentation Rate No results for input(s): ESRSEDRATE in the last 72 hours. C-Reactive Protein No results for input(s): CRP in the last 72 hours.  Micro Results: Recent Results (from the past 720 hour(s))  Culture, blood  (routine x 2)     Status: Abnormal  Collection Time: 12/10/20  3:50 AM   Specimen: BLOOD  Result Value Ref Range Status   Specimen Description BLOOD LEFT ANTECUBITAL  Final   Special Requests   Final    BOTTLES DRAWN AEROBIC AND ANAEROBIC Blood Culture adequate volume   Culture  Setup Time   Final    GRAM NEGATIVE RODS IN BOTH AEROBIC AND ANAEROBIC BOTTLES CRITICAL RESULT CALLED TO, READ BACK BY AND VERIFIED WITHJeanette Caprice [facility] 2446 12/10/20 A BROWNING Performed at [facility], [place] [address] [place], [place] [address]    Culture ESCHERICHIA COLI (A)  Final   Report Status 12/12/2020 FINAL  Final   Organism ID, Bacteria ESCHERICHIA COLI  Final      Susceptibility   Escherichia coli - MIC*    AMPICILLIN 8 SENSITIVE Sensitive     CEFAZOLIN <=4 SENSITIVE Sensitive     CEFEPIME <=0.12 SENSITIVE Sensitive     CEFTAZIDIME <=1 SENSITIVE Sensitive     CEFTRIAXONE <=0.25 SENSITIVE Sensitive     CIPROFLOXACIN <=0.25 SENSITIVE Sensitive     GENTAMICIN <=1 SENSITIVE Sensitive     IMIPENEM <=0.25 SENSITIVE Sensitive     TRIMETH/SULFA <=20 SENSITIVE Sensitive     AMPICILLIN/SULBACTAM <=2 SENSITIVE Sensitive     PIP/TAZO <=4 SENSITIVE Sensitive     * ESCHERICHIA COLI  Blood Culture ID Panel (Reflexed)     Status: Abnormal   Collection Time: 12/10/20  3:50 AM  Result Value Ref Range Status   Enterococcus faecalis NOT DETECTED NOT DETECTED Final   Enterococcus Faecium NOT DETECTED NOT DETECTED Final   Listeria monocytogenes NOT DETECTED NOT DETECTED Final   Staphylococcus species NOT DETECTED NOT DETECTED Final   Staphylococcus aureus (BCID) NOT DETECTED NOT DETECTED Final   Staphylococcus epidermidis NOT DETECTED NOT DETECTED Final   Staphylococcus lugdunensis NOT DETECTED NOT DETECTED Final   Streptococcus species NOT DETECTED NOT DETECTED Final   Streptococcus agalactiae NOT DETECTED NOT DETECTED Final   Streptococcus pneumoniae NOT DETECTED NOT DETECTED Final   Streptococcus  pyogenes NOT DETECTED NOT DETECTED Final   A.calcoaceticus-baumannii NOT DETECTED NOT DETECTED Final   Bacteroides fragilis NOT DETECTED NOT DETECTED Final   Enterobacterales DETECTED (A) NOT DETECTED Final    Comment: Enterobacterales represent a large order of gram negative bacteria, not a single organism. CRITICAL RESULT CALLED TO, READ BACK BY AND VERIFIED WITH: Jeanette Caprice PHARMD [id number] 12/10/20 A BROWNING    Enterobacter cloacae complex NOT DETECTED NOT DETECTED Final   Escherichia coli DETECTED (A) NOT DETECTED Final    Comment: CRITICAL RESULT CALLED TO, READ BACK BY AND VERIFIED WITH: Jeanette Caprice PHARMD 1657 12/10/20 A BROWNING    Klebsiella aerogenes NOT DETECTED NOT DETECTED Final   Klebsiella oxytoca NOT DETECTED NOT DETECTED Final   Klebsiella pneumoniae NOT DETECTED NOT DETECTED Final   Proteus species NOT DETECTED NOT DETECTED Final   Salmonella species NOT DETECTED NOT DETECTED Final   Serratia marcescens NOT DETECTED NOT DETECTED Final   Haemophilus influenzae NOT DETECTED NOT DETECTED Final   Neisseria meningitidis NOT DETECTED NOT DETECTED Final   Pseudomonas aeruginosa NOT DETECTED NOT DETECTED Final   Stenotrophomonas maltophilia NOT DETECTED NOT DETECTED Final   Candida albicans NOT DETECTED NOT DETECTED Final   Candida auris NOT DETECTED NOT DETECTED Final   Candida glabrata NOT DETECTED NOT DETECTED Final   Candida krusei NOT DETECTED NOT DETECTED Final   Candida parapsilosis NOT DETECTED NOT DETECTED Final   Candida tropicalis NOT DETECTED NOT DETECTED Final  Cryptococcus neoformans/gattii NOT DETECTED NOT DETECTED Final   CTX-M ESBL NOT DETECTED NOT DETECTED Final   Carbapenem resistance IMP NOT DETECTED NOT DETECTED Final   Carbapenem resistance KPC NOT DETECTED NOT DETECTED Final   Carbapenem resistance NDM NOT DETECTED NOT DETECTED Final   Carbapenem resist OXA 48 LIKE NOT DETECTED NOT DETECTED Final   Carbapenem resistance VIM NOT DETECTED NOT DETECTED Final     Comment: Performed at [facility], [place] [address] [place], [place] [address]  Resp Panel by RT-PCR (Flu A&B, Covid) Nasopharyngeal Swab     Status: None   Collection Time: 12/10/20  3:59 AM   Specimen: Nasopharyngeal Swab; Nasopharyngeal(NP) swabs in vial transport medium  Result Value Ref Range Status   SARS Coronavirus 2 by RT PCR NEGATIVE NEGATIVE Final    Comment: (NOTE) SARS-CoV-2 target nucleic acids are NOT DETECTED.  The SARS-CoV-2 RNA is generally detectable in upper respiratory specimens during the acute phase of infection. The lowest concentration of SARS-CoV-2 viral copies this assay can detect is 138 copies/mL. A negative result does not preclude SARS-Cov-2 infection and should not be used as the sole basis for treatment or other patient management decisions. A negative result may occur with  improper specimen collection/handling, submission of specimen other than nasopharyngeal swab, presence of viral mutation(s) within the areas targeted by this assay, and inadequate number of viral copies(<138 copies/mL). A negative result must be combined with clinical observations, patient history, and epidemiological information. The expected result is Negative.  Fact Sheet for Patients:  EntrepreneurPulse.com.au  Fact Sheet for Healthcare Providers:  IncredibleEmployment.be  This test is no t yet approved or cleared by the [place] FDA and  has been authorized for detection and/or diagnosis of SARS-CoV-2 by FDA under an Emergency Use Authorization (EUA). This EUA will remain  in effect (meaning this test can be used) for the duration of the COVID-19 declaration under Section 564(b)(1) of the Act, 21 U.S.C.section 360bbb-3(b)(1), unless the authorization is terminated  or revoked sooner.       Influenza A by PCR NEGATIVE NEGATIVE Final   Influenza B by PCR NEGATIVE NEGATIVE Final    Comment: (NOTE) The Xpert Xpress  SARS-CoV-2/FLU/RSV plus assay is intended as an aid in the diagnosis of influenza from Nasopharyngeal swab specimens and should not be used as a sole basis for treatment. Nasal washings and aspirates are unacceptable for Xpert Xpress SARS-CoV-2/FLU/RSV testing.  Fact Sheet for Patients: EntrepreneurPulse.com.au  Fact Sheet for Healthcare Providers: IncredibleEmployment.be  This test is not yet approved or cleared by the [place] FDA and has been authorized for detection and/or diagnosis of SARS-CoV-2 by FDA under an Emergency Use Authorization (EUA). This EUA will remain in effect (meaning this test can be used) for the duration of the COVID-19 declaration under Section 564(b)(1) of the Act, 21 U.S.C. section 360bbb-3(b)(1), unless the authorization is terminated or revoked.  Performed at [facility], [place] [address] [place], [place] [address]   Culture, blood (routine x 2)     Status: Abnormal   Collection Time: 12/10/20  4:06 AM   Specimen: BLOOD RIGHT HAND  Result Value Ref Range Status   Specimen Description BLOOD RIGHT HAND  Final   Special Requests   Final    BOTTLES DRAWN AEROBIC AND ANAEROBIC Blood Culture results may not be optimal due to an inadequate volume of blood received in culture bottles   Culture  Setup Time   Final    GRAM NEGATIVE  RODS IN BOTH AEROBIC AND ANAEROBIC BOTTLES IDENTIFICATION TO FOLLOW CRITICAL RESULT CALLED TO, READ BACK BY AND VERIFIED WITHJeanette Caprice PHARMD [id number] 12/10/20 A BROWNING    Culture (A)  Final    ESCHERICHIA COLI SUSCEPTIBILITIES PERFORMED ON PREVIOUS CULTURE WITHIN THE LAST 5 DAYS. Performed at [facility], [place] [address], [place], [place] [address]    Report Status 12/12/2020 FINAL  Final  Urine culture     Status: Abnormal   Collection Time: 12/10/20  4:40 AM   Specimen: Urine, Clean Catch  Result Value Ref Range Status   Specimen Description URINE, CLEAN CATCH  Final    Special Requests   Final    NONE Performed at [facility], [place] [address] [place], [place] [address]    Culture >=100,000 COLONIES/mL ESCHERICHIA COLI (A)  Final   Report Status 12/12/2020 FINAL  Final   Organism ID, Bacteria ESCHERICHIA COLI (A)  Final      Susceptibility   Escherichia coli - MIC*    AMPICILLIN 8 SENSITIVE Sensitive     CEFAZOLIN <=4 SENSITIVE Sensitive     CEFEPIME <=0.12 SENSITIVE Sensitive     CEFTRIAXONE <=0.25 SENSITIVE Sensitive     CIPROFLOXACIN <=0.25 SENSITIVE Sensitive     GENTAMICIN <=1 SENSITIVE Sensitive     IMIPENEM <=0.25 SENSITIVE Sensitive     NITROFURANTOIN <=16 SENSITIVE Sensitive     TRIMETH/SULFA <=20 SENSITIVE Sensitive     AMPICILLIN/SULBACTAM <=2 SENSITIVE Sensitive     PIP/TAZO <=4 SENSITIVE Sensitive     * >=100,000 COLONIES/mL ESCHERICHIA COLI  MRSA PCR Screening     Status: None   Collection Time: 12/10/20 11:24 AM   Specimen: Nasal Mucosa; Nasopharyngeal  Result Value Ref Range Status   MRSA by PCR NEGATIVE NEGATIVE Final    Comment:        The GeneXpert MRSA Assay (FDA approved for NASAL specimens only), is one component of a comprehensive MRSA colonization surveillance program. It is not intended to diagnose MRSA infection nor to guide or monitor treatment for MRSA infections. Performed at [facility], [place] [address], [place], [place] [address]   Culture, Respiratory w Gram Stain     Status: None   Collection Time: 12/15/20  7:39 AM   Specimen: Tracheal Aspirate; Respiratory  Result Value Ref Range Status   Specimen Description TRACHEAL ASPIRATE  Final   Special Requests NONE  Final   Gram Stain   Final    RARE WBC PRESENT, PREDOMINANTLY PMN MODERATE GRAM NEGATIVE RODS RARE GRAM POSITIVE COCCI    Culture   Final    RARE Consistent with normal respiratory flora. No Pseudomonas species isolated Performed at [place] [address] [place], [place] [address]    Report Status 12/17/2020 FINAL   Final  Culture, BAL-quantitative w Gram Stain     Status: Abnormal   Collection Time: 12/15/20  9:36 AM   Specimen: Bronchoalveolar Lavage; Respiratory  Result Value Ref Range Status   Specimen Description BRONCHIAL ALVEOLAR LAVAGE  Final   Special Requests NONE  Final   Gram Stain   Final    FEW WBC PRESENT,BOTH PMN AND MONONUCLEAR NO ORGANISMS SEEN    Culture (A)  Final    3,000 COLONIES/mL Consistent with normal respiratory flora. Performed at [facility], [place] [address], [place], [place] [address]    Report Status 12/17/2020 FINAL  Final  Urine Culture     Status: None  Collection Time: 12/16/20  9:53 PM   Specimen: Urine, Cystoscope  Result Value Ref Range Status   Specimen Description URINE, RANDOM  Final   Special Requests PT ON CEFATAN,CYSTO URINE  Final   Culture   Final    NO GROWTH Performed at [facility], [place] [address] [place], [place] [address]    Report Status 12/18/2020 FINAL  Final  Culture, Respiratory w Gram Stain     Status: None   Collection Time: 12/20/20 11:45 AM   Specimen: Tracheal Aspirate; Respiratory  Result Value Ref Range Status   Specimen Description TRACHEAL ASPIRATE  Final   Special Requests NONE  Final   Gram Stain   Final    ABUNDANT WBC PRESENT, PREDOMINANTLY PMN FEW SQUAMOUS EPITHELIAL CELLS PRESENT MODERATE GRAM POSITIVE COCCI IN CLUSTERS RARE GRAM POSITIVE RODS RARE GRAM NEGATIVE RODS Performed at [facility], [place] [address] [place], [place] [address]    Culture FEW STAPHYLOCOCCUS AUREUS  Final   Report Status 12/24/2020 FINAL  Final   Organism ID, Bacteria STAPHYLOCOCCUS AUREUS  Final      Susceptibility   Staphylococcus aureus - MIC*    CIPROFLOXACIN <=0.5 SENSITIVE Sensitive     ERYTHROMYCIN <=0.25 SENSITIVE Sensitive     GENTAMICIN <=0.5 SENSITIVE Sensitive     OXACILLIN 0.5 SENSITIVE Sensitive     TETRACYCLINE <=1 SENSITIVE Sensitive     VANCOMYCIN <=0.5 SENSITIVE Sensitive     TRIMETH/SULFA <=10  SENSITIVE Sensitive     CLINDAMYCIN <=0.25 SENSITIVE Sensitive     RIFAMPIN <=0.5 SENSITIVE Sensitive     Inducible Clindamycin NEGATIVE Sensitive     * FEW STAPHYLOCOCCUS AUREUS  Culture, blood (Routine X 2) w Reflex to ID Panel     Status: Abnormal   Collection Time: 12/25/20  8:07 AM   Specimen: BLOOD LEFT HAND  Result Value Ref Range Status   Specimen Description BLOOD LEFT HAND  Final   Special Requests   Final    BOTTLES DRAWN AEROBIC ONLY Blood Culture results may not be optimal due to an inadequate volume of blood received in culture bottles   Culture  Setup Time   Final    GRAM POSITIVE COCCI IN CLUSTERS AEROBIC BOTTLE ONLY    Culture (A)  Final    STAPHYLOCOCCUS EPIDERMIDIS SUSCEPTIBILITIES PERFORMED ON PREVIOUS CULTURE WITHIN THE LAST 5 DAYS. Performed at [facility], [place] [address] [place], [place] [address]    Report Status 12/27/2020 FINAL  Final  Culture, blood (Routine X 2) w Reflex to ID Panel     Status: Abnormal   Collection Time: 12/25/20  8:12 AM   Specimen: BLOOD RIGHT HAND  Result Value Ref Range Status   Specimen Description BLOOD RIGHT HAND  Final   Special Requests   Final    BOTTLES DRAWN AEROBIC ONLY Blood Culture results may not be optimal due to an inadequate volume of blood received in culture bottles   Culture  Setup Time   Final    GRAM POSITIVE COCCI IN CLUSTERS AEROBIC BOTTLE ONLY CRITICAL RESULT CALLED TO, READ BACK BY AND VERIFIED WITH: PHARMD E.WOLFE AT 2706 ON 12/25/2020 BY T.SAAD Performed at [facility], [place] [address], [place], [place] [address]    Culture STAPHYLOCOCCUS EPIDERMIDIS (A)  Final   Report Status 12/27/2020 FINAL  Final   Organism ID, Bacteria STAPHYLOCOCCUS EPIDERMIDIS  Final      Susceptibility   Staphylococcus epidermidis - MIC*    CIPROFLOXACIN >=  8 RESISTANT Resistant     ERYTHROMYCIN >=8 RESISTANT Resistant     GENTAMICIN 8 INTERMEDIATE Intermediate     OXACILLIN >=4 RESISTANT Resistant      TETRACYCLINE 2 SENSITIVE Sensitive     VANCOMYCIN 1 SENSITIVE Sensitive     TRIMETH/SULFA 80 RESISTANT Resistant     CLINDAMYCIN >=8 RESISTANT Resistant     RIFAMPIN <=0.5 SENSITIVE Sensitive     Inducible Clindamycin NEGATIVE Sensitive     * STAPHYLOCOCCUS EPIDERMIDIS  Blood Culture ID Panel (Reflexed)     Status: Abnormal   Collection Time: 12/25/20  8:12 AM  Result Value Ref Range Status   Enterococcus faecalis NOT DETECTED NOT DETECTED Final   Enterococcus Faecium NOT DETECTED NOT DETECTED Final   Listeria monocytogenes NOT DETECTED NOT DETECTED Final   Staphylococcus species DETECTED (A) NOT DETECTED Final    Comment: CRITICAL RESULT CALLED TO, READ BACK BY AND VERIFIED WITH: PHARMD E.WOLFE AT 1256 ON 12/25/2020 BY T.SAAD.    Staphylococcus aureus (BCID) NOT DETECTED NOT DETECTED Final   Staphylococcus epidermidis DETECTED (A) NOT DETECTED Final    Comment: Methicillin (oxacillin) resistant coagulase negative staphylococcus. Possible blood culture contaminant (unless isolated from more than one blood culture draw or clinical case suggests pathogenicity). No antibiotic treatment is indicated for blood  culture contaminants.    Staphylococcus lugdunensis NOT DETECTED NOT DETECTED Final   Streptococcus species NOT DETECTED NOT DETECTED Final   Streptococcus agalactiae NOT DETECTED NOT DETECTED Final   Streptococcus pneumoniae NOT DETECTED NOT DETECTED Final   Streptococcus pyogenes NOT DETECTED NOT DETECTED Final   A.calcoaceticus-baumannii NOT DETECTED NOT DETECTED Final   Bacteroides fragilis NOT DETECTED NOT DETECTED Final   Enterobacterales NOT DETECTED NOT DETECTED Final   Enterobacter cloacae complex NOT DETECTED NOT DETECTED Final   Escherichia coli NOT DETECTED NOT DETECTED Final   Klebsiella aerogenes NOT DETECTED NOT DETECTED Final   Klebsiella oxytoca NOT DETECTED NOT DETECTED Final   Klebsiella pneumoniae NOT DETECTED NOT DETECTED Final   Proteus species NOT DETECTED  NOT DETECTED Final   Salmonella species NOT DETECTED NOT DETECTED Final   Serratia marcescens NOT DETECTED NOT DETECTED Final   Haemophilus influenzae NOT DETECTED NOT DETECTED Final   Neisseria meningitidis NOT DETECTED NOT DETECTED Final   Pseudomonas aeruginosa NOT DETECTED NOT DETECTED Final   Stenotrophomonas maltophilia NOT DETECTED NOT DETECTED Final   Candida albicans NOT DETECTED NOT DETECTED Final   Candida auris NOT DETECTED NOT DETECTED Final   Candida glabrata NOT DETECTED NOT DETECTED Final   Candida krusei NOT DETECTED NOT DETECTED Final   Candida parapsilosis NOT DETECTED NOT DETECTED Final   Candida tropicalis NOT DETECTED NOT DETECTED Final   Cryptococcus neoformans/gattii NOT DETECTED NOT DETECTED Final   Methicillin resistance mecA/C DETECTED (A) NOT DETECTED Final    Comment: CRITICAL RESULT CALLED TO, READ BACK BY AND VERIFIED WITH: PHARMD E.WOLFE AT 1256 ON 12/25/2020 BY T.SAAD. Performed at [facility], [place] [address], [place], [place] [address]   Culture, blood (routine x 2)     Status: None   Collection Time: 12/26/20  5:43 AM   Specimen: BLOOD  Result Value Ref Range Status   Specimen Description BLOOD RIGHT ANTECUBITAL  Final   Special Requests   Final    BOTTLES DRAWN AEROBIC AND ANAEROBIC Blood Culture adequate volume   Culture   Final    NO GROWTH 5 DAYS Performed at [facility], [place] [address] [place], [place] [address]  Report Status 12/31/2020 FINAL  Final  Culture, blood (routine x 2)     Status: None   Collection Time: 12/26/20  5:43 AM   Specimen: BLOOD RIGHT HAND  Result Value Ref Range Status   Specimen Description BLOOD RIGHT HAND  Final   Special Requests   Final    BOTTLES DRAWN AEROBIC ONLY Blood Culture results may not be optimal due to an inadequate volume of blood received in culture bottles   Culture   Final    NO GROWTH 5 DAYS Performed at [facility], [place] [address] [place], [place] [address]    Report  Status 12/31/2020 FINAL  Final    Studies/Results: DG Swallowing Func-Speech Pathology  Result Date: 12/31/2020 Objective Swallowing Evaluation: Type of Study: MBS-Modified Barium Swallow Study  Patient Details Name: [name] MRN: [id number] Date of Birth: 06-13-1961 Today's Date: 12/31/2020 Time: SLP Start Time (ACUTE ONLY): 1036 -SLP Stop Time (ACUTE ONLY): 1059 SLP Time Calculation (min) (ACUTE ONLY): 23 min Past Medical History: No past medical history on file. Past Surgical History: Past Surgical History: Procedure Laterality Date . CYSTOSCOPY W/ URETERAL STENT PLACEMENT Left 12/16/2020  Procedure: CYSTOSCOPY WITH RETROGRADE PYELOGRAM/URETERAL STENT PLACEMENT;  Surgeon: Bjorn Loser, MD;  Location: [facility];  Service: Urology;  Laterality: Left; HPI: Pt is a 60 year old male who presented 3/31 to the ED with 4-5 day hx of diarrhea and vomiting. He developed confusion and became belligerent at home. Initial ED evaluation found him to be hypotensive, combative, confused, and he was intubated.  He was noted to have mottling over his trunk and extremities and was  admitted for hypoxemic respiratory failure and septic shock 2/2 E. Coli bacteremia and UTI, and ATN. ETT 3/31-4/5; reintubated 4/5-4/16. 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 CXR 4/14: Worsening pneumonia in both lungs, most confluent at the RIGHT  lung base.  No data recorded Assessment / Plan / Recommendation CHL IP CLINICAL IMPRESSIONS 12/31/2020 Clinical Impression Pt presented with oropharyngeal dysphagia characterized by impaired mastication, reduced lingual retraction, a pharyngeal delay, and reduced anterior laryngeal movement. He demonstrated prolonged mastication with regular texture solids with use of anterior teeth only and a munching pattern. Incomplete epiglottic inversion was often noted with liquids and resulted in reduced airway protection and  laryngeal invasion with thin and nectar thick liquids. Mild to moderate vallecular residue was reduced with pt's independent use of secondary swallows. He demonstrated penetration (PAS 3, 5) with intermittent subsequent sensed aspiration (PAS 7) with thin and nectar thick liquids. Depth of penetration was improved to PAS 3 with prompted reduced bolus sizes and prompted coughing was effective in expelling penetration at PAS 3 but not at PAS 5. Pt exhibtied difficulty consistently demonstrating compensatory strategies. A dysphagia 2 diet with honey thick liquids will be initiated at this time. SLP will follow to assess tolerance and for dysphagia treatment. SLP Visit Diagnosis Dysphagia, oropharyngeal phase (R13.12) Attention and concentration deficit following -- Frontal lobe and executive function deficit following -- Impact on safety and function Mild aspiration risk;Moderate aspiration risk   [facility] IP TREATMENT RECOMMENDATION 12/31/2020 Treatment Recommendations Therapy as outlined in treatment plan below   Prognosis 12/31/2020 Prognosis for Safe Diet Advancement Good Barriers to Reach Goals Severity of deficits Barriers/Prognosis Comment -- CHL IP DIET RECOMMENDATION 12/31/2020 SLP Diet Recommendations Dysphagia 2 (Fine chop) solids;Honey thick liquids Liquid Administration via Cup;Straw Medication Administration Via alternative means Compensations Slow rate;Small sips/bites;Multiple  dry swallows after each bite/sip;Follow solids with liquid Postural Changes Seated upright at 90 degrees;Remain semi-upright after after feeds/meals (Comment)   [facility] IP OTHER RECOMMENDATIONS 12/31/2020 Recommended Consults -- Oral Care Recommendations Oral care BID;Staff/trained caregiver to provide oral care Other Recommendations Order thickener from pharmacy;Prohibited food (jello, ice cream, thin soups)   [facility] IP FOLLOW UP RECOMMENDATIONS 12/31/2020 Follow up Recommendations (No Data)   [facility] IP FREQUENCY AND DURATION 12/31/2020 Speech  Therapy Frequency (ACUTE ONLY) min 2x/week Treatment Duration 2 weeks      CH[facility] IP ORAL PHASE 12/31/2020 Oral Phase Impaired Oral - Pudding Teaspoon -- Oral - Pudding Cup -- Oral - Honey Teaspoon -- Oral - Honey Cup Weak lingual manipulation;Lingual/palatal residue Oral - Nectar Teaspoon -- Oral - Nectar Cup Weak lingual manipulation;Lingual/palatal residue Oral - Nectar Straw Weak lingual manipulation;Lingual/palatal residue Oral - Thin Teaspoon -- Oral - Thin Cup Weak lingual manipulation;Lingual/palatal residue Oral - Thin Straw Weak lingual manipulation;Lingual/palatal residue Oral - Puree Weak lingual manipulation;Lingual/palatal residue Oral - Mech Soft -- Oral - Regular Weak lingual manipulation;Lingual/palatal residue;Impaired mastication Oral - Multi-Consistency -- Oral - Pill -- Oral Phase - Comment --  [facility] IP PHARYNGEAL PHASE 12/31/2020 Pharyngeal Phase Impaired Pharyngeal- Pudding Teaspoon -- Pharyngeal -- Pharyngeal- Pudding Cup -- Pharyngeal -- Pharyngeal- Honey Teaspoon NT Pharyngeal -- Pharyngeal- Honey Cup Reduced anterior laryngeal mobility;Reduced epiglottic inversion;Pharyngeal residue - valleculae;Reduced tongue base retraction;Reduced airway/laryngeal closure Pharyngeal -- Pharyngeal- Nectar Teaspoon NT Pharyngeal -- Pharyngeal- Nectar Cup Reduced anterior laryngeal mobility;Reduced epiglottic inversion;Reduced tongue base retraction;Reduced airway/laryngeal closure Pharyngeal Material enters airway, remains ABOVE vocal cords and not ejected out;Material enters airway, passes BELOW cords and not ejected out despite cough attempt by patient Pharyngeal- Nectar Straw Reduced anterior laryngeal mobility;Reduced epiglottic inversion;Reduced tongue base retraction;Reduced airway/laryngeal closure Pharyngeal Material enters airway, CONTACTS cords and not ejected out;Material enters airway, passes BELOW cords and not ejected out despite cough attempt by patient Pharyngeal- Thin Teaspoon -- Pharyngeal --  Pharyngeal- Thin Cup Reduced anterior laryngeal mobility;Reduced epiglottic inversion;Reduced tongue base retraction;Reduced airway/laryngeal closure Pharyngeal Material enters airway, CONTACTS cords and not ejected out;Material enters airway, remains ABOVE vocal cords and not ejected out;Material enters airway, passes BELOW cords and not ejected out despite cough attempt by patient Pharyngeal- Thin Straw Reduced anterior laryngeal mobility;Reduced epiglottic inversion;Reduced tongue base retraction;Reduced airway/laryngeal closure Pharyngeal Material enters airway, CONTACTS cords and not ejected out;Material enters airway, remains ABOVE vocal cords and not ejected out;Material enters airway, passes BELOW cords and not ejected out despite cough attempt by patient Pharyngeal- Puree Reduced anterior laryngeal mobility;Reduced epiglottic inversion;Pharyngeal residue - valleculae;Reduced tongue base retraction;Reduced airway/laryngeal closure Pharyngeal -- Pharyngeal- Mechanical Soft Reduced anterior laryngeal mobility;Reduced epiglottic inversion;Pharyngeal residue - valleculae;Reduced tongue base retraction;Reduced airway/laryngeal closure Pharyngeal -- Pharyngeal- Regular Reduced anterior laryngeal mobility;Reduced epiglottic inversion;Pharyngeal residue - valleculae;Reduced tongue base retraction;Reduced airway/laryngeal closure Pharyngeal -- Pharyngeal- Multi-consistency -- Pharyngeal -- Pharyngeal- Pill Reduced anterior laryngeal mobility;Reduced epiglottic inversion;Pharyngeal residue - valleculae;Reduced tongue base retraction;Reduced airway/laryngeal closure Pharyngeal -- Pharyngeal Comment --  [facility] IP CERVICAL ESOPHAGEAL PHASE 12/31/2020 Cervical Esophageal Phase WFL Pudding Teaspoon -- Pudding Cup -- Honey Teaspoon -- Honey Cup -- Nectar Teaspoon -- Nectar Cup -- Nectar Straw -- Thin Teaspoon -- Thin Cup -- Thin Straw -- Puree -- Mechanical Soft -- Regular -- Multi-consistency -- Pill -- Cervical Esophageal  Comment -- Shanika I. Hardin Negus, [place], [place] Office number [phone] Pager [place] 12/31/2020, 1:36 PM  Assessment/Plan:  INTERVAL HISTORY: Fever curve improved   Active Problems:   Acute respiratory failure (HCC)   AKI (acute kidney injury) ([place])   Bacteremia due to Escherichia coli   Ureteral stone   Pyelonephritis   Cerebral thrombosis with cerebral infarction   Staphylococcus epidermidis bacteremia    Deen Deguia is a 60 y.o. male with with initial admission with E. coli pyelonephritis complicated by hydronephrosis requiring placement of stents and bacteremia, then treated for MSSA pneumonia found to have MRSA he back to uremia associated with a DVT where he had had a left internal jugular vein catheter.  He was continued to have fevers on vancomycin alone while in the ICU.  His fever seem to have defervesced since then.  #1 MRSE endovascular infection:  To have him complete 6 weeks of IV vancomycin  Diagnosis: Septic thrombophlebitis  Culture Result: MRSE  Allergies  Allergen Reactions  . Ibuprofen Other (See Comments)    Blood in the stool.  . Metformin Other (See Comments)    Tremors, muscles  locking up, unable to control extremities    OPAT Orders Discharge antibiotics:    Vancomycin per pharmacy protocol  Aim for Vancomycin trough 15-20 (unless otherwise indicated)   Duration: 6 weeks  End Date:  02/04/2021  [facility] Care Per Protocol:  Labs BI- weekly while on IV antibiotics:  _x_ BMP w GFR  _x_ Vancomycin trough  Labs  weekly while on IV antibiotics: x__ CBC with differential _x_ BMP w GFR/CMP   _x_ Please pull PIC at completion of IV antibiotics __ Please leave PIC in place until doctor has seen patient or been notified  Fax weekly labs to [phone]  Clinic Follow Up Appt:   Ridwan Bondy has an appointment on 01/22/2021 with Dr. Tommy Medal at 28 AM with Dr.  Tommy Medal  He should arrive 15 to 30 minutes prior to his appointment.  The [facility] is located in the [facility] at  [place] in [place].  [address], which is located to the left of the elevators.  Phone: [phone]  Fax: [phone]  [email or web address]    #2 Fevers: Defervesced.  Certainly he is at risk of having recurrent urinary tract infection given his stone burden and would have a low threshold to reevaluate his urine.  Most recent urine cultures are sterile.  We will sign off for now please call with further questions.    LOS: 22 days   Alcide Evener 01/01/2021, 12:42 PM

## 2021-01-01 NOTE — Consult Note (Signed)
WOC Nurse wound follow up Received information from PT, Conni Slipper, the hydrotherapy will be performed on MWF beginning 4/25.  Orders changed to reflect the hydrotherapy schedule change. Helmut Muster, RN, MSN, CWOCN, CNS-BC, pager [phone]

## 2021-01-01 NOTE — Progress Notes (Signed)
SLP Cancellation Note  Patient Details Name: [name] MRN: [id number] DOB: 23-Mar-1961   Cancelled treatment:       Reason Eval/Treat Not Completed: Fatigue/lethargy limiting ability to participate (Pt was approached for treatment, but exhibited difficulty maintaining alertness. Pt's RN reported that the pt received PT earlier and may be fatigued since. SLP will follow up on subsequent date.)  Myha Arizpe I. Vear Clock, MS, CCC-SLP Acute Rehabilitation Services Office number [phone] Pager [phone]  Scheryl Marten 01/01/2021, 5:40 PM

## 2021-01-01 NOTE — Progress Notes (Signed)
PHARMACY CONSULT NOTE FOR:  OUTPATIENT  PARENTERAL ANTIBIOTIC THERAPY (OPAT)  Indication: MRSE bacteremia and septic thrombophlebitis  Regimen: Vancomycin 500 mg IV Q 12 hours  End date: 02/05/21  IV antibiotic discharge orders are pended. To discharging provider:  please sign these orders via discharge navigator,  Select New Orders & click on the button choice - Manage This Unsigned Work.     Thank you for allowing pharmacy to be a part of this patient's care.  Sharin Mons, PharmD, BCPS, BCIDP Infectious Diseases Clinical Pharmacist Phone: [phone] 01/01/2021, 1:01 PM

## 2021-01-01 NOTE — Progress Notes (Addendum)
PROGRESS NOTE    [name]  MVH:[id number] DOB: 1960-10-07 DOA: 12/10/2020 PCP: Pcp, No   Brief Narrative:  60 year old male with PMH significant for HTN, HLD presented 3/31 to the ER from home with 4-5 day hx of diarrhea and vomiting and acute encephalopathy. Admitted for hypoxemic respiratory failure and septic shock 2/2 E. Coli bacteremia and UTI, and ATN.   03/31 patient was intubated  03/31 triple-lumen catheter inserted  4/1 off pressors. E coli bacteremia, e coli uti. Some improvement in pulm opacities on CXR. On rocephin. TTE with LVEF 40-45%, LV global hypokinesis, G2DD, normal RV, dilated aortic root at 73m.    4/5 extubated, failed immediately due to mucous plugging identified on bronch after re-intubated. CT ABD with 451mleft ureteral stone with mild edema of left kideny, large areas of consolidation in bilateral lung bases.   4/6 broadened to cefepime in light of pneumonia on CT abdomen; urology consulted for distal left ureteral stone, to OR overnight for stent placement, UCx sent.  Versed added for agitation (on fent / precedex)  4/7 Off vasopressors  4/11 Vasopressors restarted   4/13: Left IJ holiday  4/15: Right IJ placed   4/16: extubated, remained agitated and restless requiring IV Precedex infusion  [place] [facility] 01/01/20[place].  Assessment & Plan:   Active Problems:   Acute respiratory failure (HCC)   AKI (acute kidney injury) ([facility]  Bacteremia due to Escherichia coli   Ureteral stone   Pyelonephritis   Cerebral thrombosis with cerebral infarction   Staphylococcus epidermidis bacteremia   Septic shock with recurrent fevers: Initially diagnosed with E. coli bacteremia, E. coli UTI and required pressors, started on Rocephin.  Off of pressors since 12/17/2020.  Then developed MSSA pneumonia/MRSE bacteremia with left IJ/[place] septic thrombophlebitis.  Now status posttreatment for E. coli bacteremia.  Transthoracic echo with no evidence of  vegetation.  ID on board.  He is now on vancomycin and plan to continue for 6 weeks with end date on 02/04/2021.  Acute hypoxic and hypercapnic respiratory failure secondary to MSSA pneumonia: Finally extubated on 12/26/2020.  PCCM recommends BiPAP at at bedtime.  Still on high flow nasal cannula 8 L.  Slightly tachypneic on exam today.  Discussed with Dr. HuSilas Floodf PCCM requesting to follow this patient closely due to tenuous medical status where he is significantly tachycardic and tachypneic with requirement of high amount of oxygen.  QTC prolongation: Avoid QTC prolonging medications and keep magnesium above 2 and potassium above 4.  Acute kidney injury secondary to sepsis/ATN/pyelonephritis: Status post left ureteral stone consistent with hydronephrosis and now status post ureteral stent placement on 12/16/2020 by urology.  Repeat ultrasound shows mild left hydronephrosis.  AKI resolved.  Abdominal aortic aneurysm: Measured 4.8 cm.  Radiology recommends follow-up imaging study in 6 months.  Acute encephalopathy/delirium: Multifactorial.  Patient is still very confused.  Continue Zyprexa nightly.  Will order delirium precautions.  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  Continue aspirin and statin.  PT/OT/SLP on board.  NaKarlene Linemanirrhosis: Outpatient follow-up with GI.  Type 2 diabetes mellitus: Blood sugar fairly controlled.  Currently on Lantus 35 units, NovoLog 8 units every 4 hours as well as sliding scale insulin and on tube feedings.  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  Echo shows 40 to 45% ejection  fraction and grade 2 diastolic dysfunction.  Currently appears euvolemic.  Checking chest x-ray.  Continue aspirin and atorvastatin.  Essential hypertension:  Blood pressure labile but mostly controlled.  Continue amlodipine.  Hypernatremia: Sodium improving, currently at 146.  Patient receiving free water almost 100 cc/h.  Will reduce to 200 cc per 4 hours.  Hopefully will reduce further tomorrow or discontinue.  Chronic macrocytic anemia: Hemoglobin is stable.  Transfuse if hemoglobin drops to less than 7.  LIJ/Subclavian DVT vs septic thrombus  --SQ lovenoxBID   Sacral ulcer: Wound care following.  DVT prophylaxis: SCDs Start: 12/10/20 0737/full dose Lovenox   Code Status: Full Code  Family Communication: None present at bedside.   Status is: Inpatient  Remains inpatient appropriate because:Inpatient level of care appropriate due to severity of illness   Dispo: The patient is from: Home              Anticipated d/c is to: LTAC              Patient currently is not medically stable to d/c.   Difficult to place patient No        Estimated body mass index is 35.86 kg/m as calculated from the following:   Height as of this encounter: _0  (1.88 m).   Weight as of this encounter: 126.7 kg.  Pressure Injury 12/21/20 Right Deep Tissue Pressure Injury - Purple or maroon localized area of discolored intact skin or blood-filled blister due to damage of underlying soft tissue from pressure and/or shear. purple with blister area that has torn (Active)  12/21/20 1000  Location:   Location Orientation: Right  Staging: Deep Tissue Pressure Injury - Purple or maroon localized area of discolored intact skin or blood-filled blister due to damage of underlying soft tissue from pressure and/or shear.  Wound Description (Comments): purple with blister area that has torn  Present on Admission: No     Pressure Injury 12/21/20 Foot Left;Lateral Deep Tissue Pressure Injury - Purple or maroon localized area of discolored intact skin or blood-filled blister due to damage of underlying soft tissue from pressure and/or shear. .5 cm purple area  (Active)  12/21/20 0800  Location: Foot  Location Orientation: Left;Lateral  Staging: Deep Tissue Pressure Injury - Purple or maroon localized area of discolored intact skin or blood-filled blister due to damage of underlying soft tissue from pressure and/or shear.  Wound Description (Comments): .5 cm purple area  Present on Admission: No     Nutritional status:  Nutrition Problem: Increased nutrient needs Etiology: wound healing   Signs/Symptoms: estimated needs   Interventions: Tube feeding    Consultants:   PCCM  Procedures:   As above  Imaging/Labs  4/11 renal US: minimal left hydronephrosis  4/05 Ct abdomen: 67m distal ureteral stone with mild edema left kidney  4/01 renal [place] normal 4/12 MRI> 933msubacute/chronic ischemic infarct of R frontal centrum semi ovale. 4/12 echo: limited bubble study, nondiagnostic  4/12 carotid [place] mild 1-39% stenosis carotid, unable to evaluate left  4/14 CT abd/pelvis: left ureteral stent in place, distal left stone still present, bibasilar consolidation 4/11: BAL with MSSA  4/15: blood cultures growing MRSE 4/19: Flash pulmonary edema vs aspiration requiring high flow [place], PICC line placed    Antimicrobials:  Anti-infectives (From admission, onward)   Start     Dose/Rate Route Frequency Ordered Stop   01/01/21 0600  vancomycin (VANCOREADY) IVPB 500 mg/100 mL        500 mg 100  mL/hr over 60 Minutes Intravenous Every 12 hours 12/31/20 1036     12/29/20 0600  vancomycin (VANCOREADY) IVPB 1000 mg/200 mL  Status:  Discontinued        1,000 mg 200 mL/hr over 60 Minutes Intravenous Every 24 hours 12/28/20 0713 12/31/20 1036   12/25/20 0600  vancomycin (VANCOREADY) IVPB 1500 mg/300 mL  Status:  Discontinued        1,500 mg 150 mL/hr over 120 Minutes Intravenous Every 24 hours 12/24/20 1840 12/28/20 0713   12/24/20 1915  vancomycin (VANCOCIN) 2,500 mg in sodium chloride 0.9 % 500 mL IVPB        2,500 mg 250 mL/hr over 120  Minutes Intravenous  Once 12/24/20 1820 12/24/20 2147   12/24/20 1000  cefTRIAXone (ROCEPHIN) 2 g in sodium chloride 0.9 % 100 mL IVPB  Status:  Discontinued        2 g 200 mL/hr over 30 Minutes Intravenous Every 24 hours 12/24/20 0856 12/25/20 1607   12/17/20 2200  cefTRIAXone (ROCEPHIN) 2 g in sodium chloride 0.9 % 100 mL IVPB        2 g 200 mL/hr over 30 Minutes Intravenous Every 24 hours 12/17/20 1123 12/21/20 2135   12/16/20 1100  ceFEPIme (MAXIPIME) 2 g in sodium chloride 0.9 % 100 mL IVPB  Status:  Discontinued        2 g 200 mL/hr over 30 Minutes Intravenous Every 12 hours 12/16/20 0946 12/17/20 1123   12/14/20 1400  ceFAZolin (ANCEF) IVPB 2g/100 mL premix  Status:  Discontinued        2 g 200 mL/hr over 30 Minutes Intravenous Every 8 hours 12/14/20 1223 12/16/20 0946   12/10/20 1530  cefTRIAXone (ROCEPHIN) 2 g in sodium chloride 0.9 % 100 mL IVPB  Status:  Discontinued        2 g 200 mL/hr over 30 Minutes Intravenous Every 24 hours 12/10/20 1436 12/14/20 1223   12/10/20 1200  piperacillin-tazobactam (ZOSYN) IVPB 2.25 g  Status:  Discontinued        2.25 g 100 mL/hr over 30 Minutes Intravenous Every 6 hours 12/10/20 0740 12/10/20 1436   12/10/20 0600  vancomycin (VANCOREADY) IVPB 1500 mg/300 mL        1,500 mg 150 mL/hr over 120 Minutes Intravenous STAT 12/10/20 0546 12/10/20 0821   12/10/20 0400  vancomycin (VANCOREADY) IVPB 1000 mg/200 mL        1,000 mg 200 mL/hr over 60 Minutes Intravenous  Once 12/10/20 0350 12/10/20 0520   12/10/20 0400  piperacillin-tazobactam (ZOSYN) IVPB 3.375 g        3.375 g 12.5 mL/hr over 240 Minutes Intravenous  Once 12/10/20 0350 12/10/20 0450         Subjective: Patient seen and examined.  Alert but pleasantly confused.  He appears to be wanting to answer my questions but his speech is incomprehensible.  Objective: Vitals:   12/31/20 2023 12/31/20 2047 01/01/21 0345 01/01/21 0826  BP:  132/72  (!) 160/69  Pulse: (!) 131 100  (!) 118   Resp: (!) 32 20  20  Temp:    98.6 F (37 C)  TempSrc:    Oral  SpO2: 98%  97% 100%  Weight:      Height:        Intake/Output Summary (Last 24 hours) at 01/01/2021 1404 Last data filed at 01/01/2021 0647 Gross per 24 hour  Intake 575 ml  Output 4000 ml  Net -3425 ml   [employer]  12/27/20 0432 12/28/20 0345 12/29/20 0400  Weight: 128.7 kg 127.8 kg 126.7 kg    Examination:  General exam: Appears calm and comfortable  Respiratory system: Diminished breath sounds bibasilar with tachypnea. Cardiovascular system: S1 & S2 heard, tachycardia. No JVD, murmurs, rubs, gallops or clicks. No pedal edema. Gastrointestinal system: Abdomen is nondistended, soft and nontender. No organomegaly or masses felt. Normal bowel sounds heard. Central nervous system: Alert but not oriented.  Full neuro examination unable to perform due to patient's inability to follow commands. Extremities: Symmetric 5 x 5 power. Skin: No rashes, lesions or ulcers    Data Reviewed: I have personally reviewed following labs and imaging studies  CBC: Recent Labs  Lab 12/26/20 0249 12/27/20 0428 12/28/20 0459 12/29/20 0412 12/29/20 0939 12/30/20 0408 12/31/20 0525 01/01/21 0230  WBC 8.0   < > 8.1 8.0  --  8.0 10.2  10.1 9.9  NEUTROABS 6.0  --   --   --   --   --  7.6  --   HGB 8.8*   < > 9.2* 8.9* 7.8* 8.7* 9.7*  9.5* 8.9*  HCT 27.9*   < > 30.5* 29.8* 23.0* 29.6* 31.9*  32.1* 28.5*  MCV 102.6*   < > 102.7* 104.9*  --  105.7* 104.2*  104.2* 101.4*  PLT 238   < > 233 217  --  198 216  208 180   < > = values in this interval not displayed.   Basic Metabolic Panel: Recent Labs  Lab 12/27/20 0428 12/28/20 0459 12/28/20 1129 12/28/20 1655 12/29/20 0412 12/29/20 0939 12/29/20 1600 12/30/20 0408 12/30/20 1746 12/31/20 0525 01/01/21 0230  NA 155*   < >  --    < > 158*   < > 155* 153* 151* 148* 146*  K 3.6   < >  --    < > 3.5   < > 3.4* 3.2* 4.1 3.9 4.0  CL 113*   < >  --    < > 123*  --   122* 119* 118* 117* 111  CO2 33*   < >  --    < > 28  --  _0 GLUCOSE 141*   < >  --    < > 176*  --  158* 191* 206* 185* 188*  BUN 72*   < >  --    < > 76*  --  68* 59* 50* 49* 52*  CREATININE 1.71*   < >  --    < > 1.50*  --  1.52* 1.18 1.09 1.06 1.16  CALCIUM 9.2   < >  --    < > 8.5*  --  8.4* 8.1* 8.6* 8.8* 8.9  MG 2.4  --  2.8*  --  2.7*  --   --  2.5*  --  2.2  --   PHOS 3.6  --   --   --  2.6  --   --  2.5  --  3.5  --    < > = values in this interval not displayed.   GFR: Estimated Creatinine Clearance: 98.2 mL/min (by C-G formula based on SCr of 1.16 mg/dL). Liver Function Tests: Recent Labs  Lab 12/26/20 0249 12/28/20 0459 12/31/20 1151  AST 29 33 21  ALT _1 ALKPHOS 56 55 58  BILITOT 0.7 0.7 0.5  PROT 7.0 7.6 7.4  ALBUMIN 2.0* 2.0* 2.3*   No results for input(s): LIPASE, AMYLASE  in the last 168 hours. Recent Labs  Lab 12/28/20 1129  AMMONIA 26   Coagulation Profile: Recent Labs  Lab 12/27/20 0602  INR 1.3*   Cardiac Enzymes: No results for input(s): CKTOTAL, CKMB, CKMBINDEX, TROPONINI in the last 168 hours. BNP (last 3 results) No results for input(s): PROBNP in the last 8760 hours. HbA1C: No results for input(s): HGBA1C in the last 72 hours. CBG: Recent Labs  Lab 12/31/20 1602 12/31/20 2006 01/01/21 0028 01/01/21 0816 01/01/21 1231  GLUCAP 159* 92 207* 144* 141*   Lipid Profile: No results for input(s): CHOL, HDL, LDLCALC, TRIG, CHOLHDL, LDLDIRECT in the last 72 hours. Thyroid Function Tests: No results for input(s): TSH, T4TOTAL, FREET4, T3FREE, THYROIDAB in the last 72 hours. Anemia Panel: No results for input(s): VITAMINB12, FOLATE, FERRITIN, TIBC, IRON, RETICCTPCT in the last 72 hours. Sepsis Labs: No results for input(s): PROCALCITON, LATICACIDVEN in the last 168 hours.  Recent Results (from the past 240 hour(s))  Culture, blood (Routine X 2) w Reflex to ID Panel     Status: Abnormal   Collection Time: 12/25/20  8:07  AM   Specimen: BLOOD LEFT HAND  Result Value Ref Range Status   Specimen Description BLOOD LEFT HAND  Final   Special Requests   Final    BOTTLES DRAWN AEROBIC ONLY Blood Culture results may not be optimal due to an inadequate volume of blood received in culture bottles   Culture  Setup Time   Final    GRAM POSITIVE COCCI IN CLUSTERS AEROBIC BOTTLE ONLY    Culture (A)  Final    STAPHYLOCOCCUS EPIDERMIDIS SUSCEPTIBILITIES PERFORMED ON PREVIOUS CULTURE WITHIN THE LAST 5 DAYS. Performed at [facility], [place] [address] [place], [place] [address]    Report Status 12/27/2020 FINAL  Final  Culture, blood (Routine X 2) w Reflex to ID Panel     Status: Abnormal   Collection Time: 12/25/20  8:12 AM   Specimen: BLOOD RIGHT HAND  Result Value Ref Range Status   Specimen Description BLOOD RIGHT HAND  Final   Special Requests   Final    BOTTLES DRAWN AEROBIC ONLY Blood Culture results may not be optimal due to an inadequate volume of blood received in culture bottles   Culture  Setup Time   Final    GRAM POSITIVE COCCI IN CLUSTERS AEROBIC BOTTLE ONLY CRITICAL RESULT CALLED TO, READ BACK BY AND VERIFIED WITH: PHARMD E.WOLFE AT 7858 ON 12/25/2020 BY T.SAAD Performed at [facility], [place] [address] [place], [place] [address]    Culture STAPHYLOCOCCUS EPIDERMIDIS (A)  Final   Report Status 12/27/2020 FINAL  Final   Organism ID, Bacteria STAPHYLOCOCCUS EPIDERMIDIS  Final      Susceptibility   Staphylococcus epidermidis - MIC*    CIPROFLOXACIN >=8 RESISTANT Resistant     ERYTHROMYCIN >=8 RESISTANT Resistant     GENTAMICIN 8 INTERMEDIATE Intermediate     OXACILLIN >=4 RESISTANT Resistant     TETRACYCLINE 2 SENSITIVE Sensitive     VANCOMYCIN 1 SENSITIVE Sensitive     TRIMETH/SULFA 80 RESISTANT Resistant     CLINDAMYCIN >=8 RESISTANT Resistant     RIFAMPIN <=0.5 SENSITIVE Sensitive     Inducible Clindamycin NEGATIVE Sensitive     * STAPHYLOCOCCUS EPIDERMIDIS  Blood Culture ID  Panel (Reflexed)     Status: Abnormal   Collection Time: 12/25/20  8:12 AM  Result Value Ref Range Status   Enterococcus faecalis NOT DETECTED NOT DETECTED Final   Enterococcus  Faecium NOT DETECTED NOT DETECTED Final   Listeria monocytogenes NOT DETECTED NOT DETECTED Final   Staphylococcus species DETECTED (A) NOT DETECTED Final    Comment: CRITICAL RESULT CALLED TO, READ BACK BY AND VERIFIED WITH: PHARMD E.WOLFE AT 1256 ON 12/25/2020 BY T.SAAD.    Staphylococcus aureus (BCID) NOT DETECTED NOT DETECTED Final   Staphylococcus epidermidis DETECTED (A) NOT DETECTED Final    Comment: Methicillin (oxacillin) resistant coagulase negative staphylococcus. Possible blood culture contaminant (unless isolated from more than one blood culture draw or clinical case suggests pathogenicity). No antibiotic treatment is indicated for blood  culture contaminants.    Staphylococcus lugdunensis NOT DETECTED NOT DETECTED Final   Streptococcus species NOT DETECTED NOT DETECTED Final   Streptococcus agalactiae NOT DETECTED NOT DETECTED Final   Streptococcus pneumoniae NOT DETECTED NOT DETECTED Final   Streptococcus pyogenes NOT DETECTED NOT DETECTED Final   A.calcoaceticus-baumannii NOT DETECTED NOT DETECTED Final   Bacteroides fragilis NOT DETECTED NOT DETECTED Final   Enterobacterales NOT DETECTED NOT DETECTED Final   Enterobacter cloacae complex NOT DETECTED NOT DETECTED Final   Escherichia coli NOT DETECTED NOT DETECTED Final   Klebsiella aerogenes NOT DETECTED NOT DETECTED Final   Klebsiella oxytoca NOT DETECTED NOT DETECTED Final   Klebsiella pneumoniae NOT DETECTED NOT DETECTED Final   Proteus species NOT DETECTED NOT DETECTED Final   Salmonella species NOT DETECTED NOT DETECTED Final   Serratia marcescens NOT DETECTED NOT DETECTED Final   Haemophilus influenzae NOT DETECTED NOT DETECTED Final   Neisseria meningitidis NOT DETECTED NOT DETECTED Final   Pseudomonas aeruginosa NOT DETECTED NOT DETECTED  Final   Stenotrophomonas maltophilia NOT DETECTED NOT DETECTED Final   Candida albicans NOT DETECTED NOT DETECTED Final   Candida auris NOT DETECTED NOT DETECTED Final   Candida glabrata NOT DETECTED NOT DETECTED Final   Candida krusei NOT DETECTED NOT DETECTED Final   Candida parapsilosis NOT DETECTED NOT DETECTED Final   Candida tropicalis NOT DETECTED NOT DETECTED Final   Cryptococcus neoformans/gattii NOT DETECTED NOT DETECTED Final   Methicillin resistance mecA/C DETECTED (A) NOT DETECTED Final    Comment: CRITICAL RESULT CALLED TO, READ BACK BY AND VERIFIED WITH: PHARMD E.WOLFE AT 1256 ON 12/25/2020 BY T.SAAD. Performed at [facility], [place] [address], [place], [place] [address]   Culture, blood (routine x 2)     Status: None   Collection Time: 12/26/20  5:43 AM   Specimen: BLOOD  Result Value Ref Range Status   Specimen Description BLOOD RIGHT ANTECUBITAL  Final   Special Requests   Final    BOTTLES DRAWN AEROBIC AND ANAEROBIC Blood Culture adequate volume   Culture   Final    NO GROWTH 5 DAYS Performed at [facility], [place] [address], [place], [place] [address]    Report Status 12/31/2020 FINAL  Final  Culture, blood (routine x 2)     Status: None   Collection Time: 12/26/20  5:43 AM   Specimen: BLOOD RIGHT HAND  Result Value Ref Range Status   Specimen Description BLOOD RIGHT HAND  Final   Special Requests   Final    BOTTLES DRAWN AEROBIC ONLY Blood Culture results may not be optimal due to an inadequate volume of blood received in culture bottles   Culture   Final    NO GROWTH 5 DAYS Performed at [facility], [place] [address], [place], [place] [address]    Report Status 12/31/2020 FINAL  Final  Radiology Studies: DG Swallowing Func-Speech Pathology  Result Date: 12/31/2020 Objective Swallowing Evaluation: Type of Study: MBS-Modified Barium Swallow Study  Patient Details Name: [name] MRN: [id number] Date of Birth: 1960/12/24 Today's Date:  12/31/2020 Time: SLP Start Time (ACUTE ONLY): 1036 -SLP Stop Time (ACUTE ONLY): 1059 SLP Time Calculation (min) (ACUTE ONLY): 23 min Past Medical History: No past medical history on file. Past Surgical History: Past Surgical History: Procedure Laterality Date . CYSTOSCOPY W/ URETERAL STENT PLACEMENT Left 12/16/2020  Procedure: CYSTOSCOPY WITH RETROGRADE PYELOGRAM/URETERAL STENT PLACEMENT;  Surgeon: Bjorn Loser, MD;  Location: [facility];  Service: Urology;  Laterality: Left; HPI: Pt is a 60 year old male who presented 3/31 to the ED with 4-5 day hx of diarrhea and vomiting. He developed confusion and became belligerent at home. Initial ED evaluation found him to be hypotensive, combative, confused, and he was intubated.  He was noted to have mottling over his trunk and extremities and was  admitted for hypoxemic respiratory failure and septic shock 2/2 E. Coli bacteremia and UTI, and ATN. ETT 3/31-4/5; reintubated 4/5-4/16. 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 CXR 4/14: Worsening pneumonia in both lungs, most confluent at the RIGHT  lung base.  No data recorded Assessment / Plan / Recommendation CHL IP CLINICAL IMPRESSIONS 12/31/2020 Clinical Impression Pt presented with oropharyngeal dysphagia characterized by impaired mastication, reduced lingual retraction, a pharyngeal delay, and reduced anterior laryngeal movement. He demonstrated prolonged mastication with regular texture solids with use of anterior teeth only and a munching pattern. Incomplete epiglottic inversion was often noted with liquids and resulted in reduced airway protection and laryngeal invasion with thin and nectar thick liquids. Mild to moderate vallecular residue was reduced with pt's independent use of secondary swallows. He demonstrated penetration (PAS 3, 5) with intermittent subsequent sensed aspiration (PAS 7) with thin and nectar thick liquids. Depth of  penetration was improved to PAS 3 with prompted reduced bolus sizes and prompted coughing was effective in expelling penetration at PAS 3 but not at PAS 5. Pt exhibtied difficulty consistently demonstrating compensatory strategies. A dysphagia 2 diet with honey thick liquids will be initiated at this time. SLP will follow to assess tolerance and for dysphagia treatment. SLP Visit Diagnosis Dysphagia, oropharyngeal phase (R13.12) Attention and concentration deficit following -- Frontal lobe and executive function deficit following -- Impact on safety and function Mild aspiration risk;Moderate aspiration risk   CHL IP TREATMENT RECOMMENDATION 12/31/2020 Treatment Recommendations Therapy as outlined in treatment plan below   Prognosis 12/31/2020 Prognosis for Safe Diet Advancement Good Barriers to Reach Goals Severity of deficits Barriers/Prognosis Comment -- CHL IP DIET RECOMMENDATION 12/31/2020 SLP Diet Recommendations Dysphagia 2 (Fine chop) solids;Honey thick liquids Liquid Administration via Cup;Straw Medication Administration Via alternative means Compensations Slow rate;Small sips/bites;Multiple dry swallows after each bite/sip;Follow solids with liquid Postural Changes Seated upright at 90 degrees;Remain semi-upright after after feeds/meals (Comment)   CHL IP OTHER RECOMMENDATIONS 12/31/2020 Recommended Consults -- Oral Care Recommendations Oral care BID;Staff/trained caregiver to provide oral care Other Recommendations Order thickener from pharmacy;Prohibited food (jello, ice cream, thin soups)   CHL IP FOLLOW UP RECOMMENDATIONS 12/31/2020 Follow up Recommendations (No Data)   CHL IP FREQUENCY AND DURATION 12/31/2020 Speech Therapy Frequency (ACUTE ONLY) min 2x/week Treatment Duration 2 weeks      CHL IP ORAL PHASE 12/31/2020 Oral Phase Impaired Oral - Pudding Teaspoon -- Oral - Pudding Cup -- Oral - Honey Teaspoon -- Oral - Honey  Cup Weak lingual manipulation;Lingual/palatal residue Oral - Nectar Teaspoon -- Oral -  Nectar Cup Weak lingual manipulation;Lingual/palatal residue Oral - Nectar Straw Weak lingual manipulation;Lingual/palatal residue Oral - Thin Teaspoon -- Oral - Thin Cup Weak lingual manipulation;Lingual/palatal residue Oral - Thin Straw Weak lingual manipulation;Lingual/palatal residue Oral - Puree Weak lingual manipulation;Lingual/palatal residue Oral - Mech Soft -- Oral - Regular Weak lingual manipulation;Lingual/palatal residue;Impaired mastication Oral - Multi-Consistency -- Oral - Pill -- Oral Phase - Comment --  CHL IP PHARYNGEAL PHASE 12/31/2020 Pharyngeal Phase Impaired Pharyngeal- Pudding Teaspoon -- Pharyngeal -- Pharyngeal- Pudding Cup -- Pharyngeal -- Pharyngeal- Honey Teaspoon NT Pharyngeal -- Pharyngeal- Honey Cup Reduced anterior laryngeal mobility;Reduced epiglottic inversion;Pharyngeal residue - valleculae;Reduced tongue base retraction;Reduced airway/laryngeal closure Pharyngeal -- Pharyngeal- Nectar Teaspoon NT Pharyngeal -- Pharyngeal- Nectar Cup Reduced anterior laryngeal mobility;Reduced epiglottic inversion;Reduced tongue base retraction;Reduced airway/laryngeal closure Pharyngeal Material enters airway, remains ABOVE vocal cords and not ejected out;Material enters airway, passes BELOW cords and not ejected out despite cough attempt by patient Pharyngeal- Nectar Straw Reduced anterior laryngeal mobility;Reduced epiglottic inversion;Reduced tongue base retraction;Reduced airway/laryngeal closure Pharyngeal Material enters airway, CONTACTS cords and not ejected out;Material enters airway, passes BELOW cords and not ejected out despite cough attempt by patient Pharyngeal- Thin Teaspoon -- Pharyngeal -- Pharyngeal- Thin Cup Reduced anterior laryngeal mobility;Reduced epiglottic inversion;Reduced tongue base retraction;Reduced airway/laryngeal closure Pharyngeal Material enters airway, CONTACTS cords and not ejected out;Material enters airway, remains ABOVE vocal cords and not ejected out;Material  enters airway, passes BELOW cords and not ejected out despite cough attempt by patient Pharyngeal- Thin Straw Reduced anterior laryngeal mobility;Reduced epiglottic inversion;Reduced tongue base retraction;Reduced airway/laryngeal closure Pharyngeal Material enters airway, CONTACTS cords and not ejected out;Material enters airway, remains ABOVE vocal cords and not ejected out;Material enters airway, passes BELOW cords and not ejected out despite cough attempt by patient Pharyngeal- Puree Reduced anterior laryngeal mobility;Reduced epiglottic inversion;Pharyngeal residue - valleculae;Reduced tongue base retraction;Reduced airway/laryngeal closure Pharyngeal -- Pharyngeal- Mechanical Soft Reduced anterior laryngeal mobility;Reduced epiglottic inversion;Pharyngeal residue - valleculae;Reduced tongue base retraction;Reduced airway/laryngeal closure Pharyngeal -- Pharyngeal- Regular Reduced anterior laryngeal mobility;Reduced epiglottic inversion;Pharyngeal residue - valleculae;Reduced tongue base retraction;Reduced airway/laryngeal closure Pharyngeal -- Pharyngeal- Multi-consistency -- Pharyngeal -- Pharyngeal- Pill Reduced anterior laryngeal mobility;Reduced epiglottic inversion;Pharyngeal residue - valleculae;Reduced tongue base retraction;Reduced airway/laryngeal closure Pharyngeal -- Pharyngeal Comment --  [facility] IP CERVICAL ESOPHAGEAL PHASE 12/31/2020 Cervical Esophageal Phase WFL Pudding Teaspoon -- Pudding Cup -- Honey Teaspoon -- Honey Cup -- Nectar Teaspoon -- Nectar Cup -- Nectar Straw -- Thin Teaspoon -- Thin Cup -- Thin Straw -- Puree -- Mechanical Soft -- Regular -- Multi-consistency -- Pill -- Cervical Esophageal Comment -- Shanika I. Hardin Negus, [place], [place] Office number [phone] Pager [phone] Horton Marshall 12/31/2020, 1:36 PM               Scheduled Meds: . amLODipine  5 mg Per Tube Daily  . aspirin  81 mg Per Tube Daily  . atorvastatin  40 mg Per Tube Daily  .  chlorhexidine  15 mL Mouth Rinse BID  . Chlorhexidine Gluconate Cloth  6 each Topical Daily  . collagenase   Topical Daily  . docusate  50 mg Per Tube Daily  . enoxaparin (LOVENOX) injection  120 mg Subcutaneous Q12H  . feeding supplement (PROSource TF)  90 mL Per Tube QID  . free water  200 mL Per Tube Q4H  . insulin aspart  0-20 Units Subcutaneous Q4H  . insulin aspart  8 Units Subcutaneous Q4H  .  insulin detemir  35 Units Subcutaneous BID  . mouth rinse  15 mL Mouth Rinse q12n4p  . nutrition supplement (JUVEN)  1 packet Per Tube BID BM  . OLANZapine  10 mg Per Tube QHS  . oxyCODONE  5 mg Per Tube Q6H  . polyethylene glycol  17 g Per Tube Daily  . sodium chloride flush  10-40 mL Intracatheter Q12H  . sodium chloride flush  10-40 mL Intracatheter Q12H  . vitamin B-12  100 mcg Per Tube Daily   Continuous Infusions: . sodium chloride Stopped (12/31/20 1020)  . feeding supplement (OSMOLITE 1.2 CAL) 1,000 mL (01/01/21 0925)  . vancomycin 500 mg (01/01/21 0620)     LOS: 22 days   Time spent: 41 minutes   Darliss Cheney, MD [facility]  01/01/2021, 2:04 PM   To contact the attending provider between 7A-7P or the covering provider during after hours 7P-7A, please log into the web site [email or web address].

## 2021-01-01 NOTE — Progress Notes (Signed)
NAME:  [name], MRN:  [id number], DOB:  02-06-1961, LOS: 75 ADMISSION DATE:  12/10/2020,   History of Present Illness:  60 year old male with PMH significant for HTN, HLD presented 3/31 to the ER from home with 4-5 day hx of diarrhea and vomiting and acute encephalopathy. Admitted for hypoxemic respiratory failure and septic shock 2/2 E. Coli bacteremia and UTI, and ATN.   Pertinent  Medical History  Prior smoker quit 15 years ago 82-100-pack-year history Sinusitis  Chronic pain syndrome Hypertension Hyperlipidemia Chronic back pain  Significant Hospital Events: Including procedures, antibiotic start and stop dates in addition to other pertinent events   . 03/31 patient was intubated . 03/31 triple-lumen catheter inserted . 4/1 off pressors. E coli bacteremia, e coli uti. Some improvement in pulm opacities on CXR. On rocephin. TTE with LVEF 40-45%, LV global hypokinesis, G2DD, normal RV, dilated aortic root at 66m.   . 4/5 extubated, failed immediately due to mucous plugging identified on bronch after re-intubated. CT ABD with 468mleft ureteral stone with mild edema of left kideny, large areas of consolidation in bilateral lung bases.  . 4/6 broadened to cefepime in light of pneumonia on CT abdomen; urology consulted for distal left ureteral stone, to OR overnight for stent placement, UCx sent.  Versed added for agitation (on fent / precedex) . 4/7 Off vasopressors . 4/11 Vasopressors restarted  . 4/13: Left IJ holiday . 4/15: Right IJ placed  . 4/16: extubated, remained agitated and restless requiring IV Precedex infusion  Interim History / Subjective:  No acute events overnight. HR, respiratory status unchanged.   Objective   Blood pressure (!) 150/72, pulse (!) 123, temperature 98.6 F (37 C), temperature source Oral, resp. rate (!) 28, height _0  (1.88 m), weight 126.7 kg, SpO2 92 %.        Intake/Output Summary (Last 24 hours) at 01/01/2021 1534 Last data filed at  01/01/2021 [id number] per 24 hour  Intake --  Output 2650 ml  Net -2650 ml    Filed Weights   12/27/20 0432 12/28/20 0345 12/29/20 0400  Weight: 128.7 kg 127.8 kg 126.7 kg    Examination:  General: Obese adult male, lying in bed  HEENT: small bore ng in place, very dry MM  Neuro: Opens eyes to voice, follows commands, oriented to self and place, weak voice, L arm withdraw to pain, does not lift off bed PULM: diminished to bases  GI: Obese, soft, active bowel sounds  Extremities: No edema  Skin: sacral stage II pressure ulcer  Imaging/Labs  4/11 renal US[place]ore[place]imal left hydronephrosis  4/05 Ct abdomen: 92m46mistal ureteral stone with mild edema left kidney  4/01 renal US:[place] 4/12 MRI> 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 4/12 echo: limited bubble study, nondiagnostic  4/12 carotid US: [place] mild 1-39% stenosis carotid, unable to evaluate left  4/14 CT abd/pelvis: left ureteral stent in place, distal left stone still present, bibasilar consolidation 4/11: BAL with MSSA  4/15: blood cultures growing MRSE 4/19: Flash pulmonary edema vs aspiration requiring high flow [place], PICC line placed 4/21: transferred to progressive, weaning O2 4/22: Weaning O2, LR bolus for tachycardia  Resolved Hospital Problem list   Traumatic Urethral Bleeding Septic shock due to E.Coli UTI, E. Coli bacteremia, Pyelonephritis Hypernatremia  Assessment & Plan:   Acute hypoxic and hypercarbic respiratory failure 2/2 MSSA Pneumonia with subsequent volume overload: Weaned O2 from 8L to 6L as satting high 90s. He takes shallow breaths, tachypnea  but no accessory muscle use. Encephalopathy contributing as well. Tachypnea is episodic, RR in low 20s at times. -Continue to titrate supplemental oxygen for saturation goal >92% -On vanc per ID  Tachycardia: Suspect related to hypovolemia in setting of aggressive diuresis in preceding days. MM very dry. --LR 500 cc  bolus --Further management per TRH --Free water flushes to continue as Na slowly coming down  We will follow up on 4/25 or sooner as needed   Best practice (right click and "Reselect all SmartList Selections" daily)  Per primary

## 2021-01-02 LAB — CBC
HCT: 28.6 % — ABNORMAL LOW (ref 39.0–52.0)
Hemoglobin: 8.9 g/dL — ABNORMAL LOW (ref 13.0–17.0)
MCH: 31.4 pg (ref 26.0–34.0)
MCHC: 31.1 g/dL (ref 30.0–36.0)
MCV: 101.1 fL — ABNORMAL HIGH (ref 80.0–100.0)
Platelets: 173 10*3/uL (ref 150–400)
RBC: 2.83 MIL/uL — ABNORMAL LOW (ref 4.22–5.81)
RDW: 13.6 % (ref 11.5–15.5)
WBC: 9.3 10*3/uL (ref 4.0–10.5)
nRBC: 0 % (ref 0.0–0.2)

## 2021-01-02 LAB — COMPREHENSIVE METABOLIC PANEL
ALT: 14 U/L (ref 0–44)
AST: 22 U/L (ref 15–41)
Albumin: 2.2 g/dL — ABNORMAL LOW (ref 3.5–5.0)
Alkaline Phosphatase: 62 U/L (ref 38–126)
Anion gap: 6 (ref 5–15)
BUN: 55 mg/dL — ABNORMAL HIGH (ref 6–20)
CO2: 28 mmol/L (ref 22–32)
Calcium: 8.8 mg/dL — ABNORMAL LOW (ref 8.9–10.3)
Chloride: 111 mmol/L (ref 98–111)
Creatinine, Ser: 1.2 mg/dL (ref 0.61–1.24)
GFR, Estimated: >60 mL/min (ref >60)
Glucose, Bld: 166 mg/dL — ABNORMAL HIGH (ref 70–99)
Potassium: 4 mmol/L (ref 3.5–5.1)
Sodium: 145 mmol/L (ref 135–145)
Total Bilirubin: 0.5 mg/dL (ref 0.3–1.2)
Total Protein: 7.2 g/dL (ref 6.5–8.1)

## 2021-01-02 LAB — GLUCOSE, CAPILLARY
Glucose-Capillary: 143 mg/dL — ABNORMAL HIGH (ref 70–99)
Glucose-Capillary: 135 mg/dL — ABNORMAL HIGH (ref 70–99)
Glucose-Capillary: 151 mg/dL — ABNORMAL HIGH (ref 70–99)
Glucose-Capillary: 154 mg/dL — ABNORMAL HIGH (ref 70–99)
Glucose-Capillary: 151 mg/dL — ABNORMAL HIGH (ref 70–99)
Glucose-Capillary: 143 mg/dL — ABNORMAL HIGH (ref 70–99)
Glucose-Capillary: 135 mg/dL — ABNORMAL HIGH (ref 70–99)

## 2021-01-02 LAB — URINALYSIS, COMPLETE (UACMP) WITH MICROSCOPIC
Bilirubin Urine: NEGATIVE
Glucose, UA: NEGATIVE mg/dL
Ketones, ur: NEGATIVE mg/dL
Nitrite: NEGATIVE
Protein, ur: 30 mg/dL — AB
RBC / HPF: >50 RBC/hpf — ABNORMAL HIGH (ref 0–5)
Specific Gravity, Urine: 1.016 (ref 1.005–1.030)
pH: 5 (ref 5.0–8.0)

## 2021-01-02 LAB — MAGNESIUM: Magnesium: 2.2 mg/dL (ref 1.7–2.4)

## 2021-01-02 MED ORDER — SODIUM CHLORIDE 0.45 % IV SOLN: INTRAVENOUS | Status: AC

## 2021-01-02 NOTE — Progress Notes (Signed)
PROGRESS NOTE    [name]  ZOX:[id number] DOB: 02-02-1961 DOA: 12/10/2020 PCP: Pcp, No   Chief Complaint  Patient presents with  . Altered Mental Status  Brief Narrative: 60 YO male w/ hypertension, hyperlipidemia presented to the ED 3/31 from home with for 5 days history of diarrhea nausea vomiting and confusion.  Patient was admitted for acute hypoxic aspiratory failure with septic shock due to E. coli bacteremia and UTI and ATN needing intubation pressor support sedation with Precedex/Versed. Patient had prolonged hospitalization complicated course (see previous ICU/transfer note) was managed by critical care, infectious disease for E. coli pyelonephritis complicated by hydronephrosis requiring placement of a stent and bacteremia then MSSA pneumonia,   MRSE endovascular infection, acute hypoxic/hypercapnic respiratory failure/acute toxic metabolic encephalopathy, AKI, left IJ/subclavian DVT versus septic thrombus-managed with Lovenox and ongoing fever/tachycardia..  Patient transferred to [facility] on 01/01/2021-with ongoing confusion/altered mental status and mentation slowly improving, ongoing tachycardia, fevers episodes Cortrack NG feeding, PICC line.  Subjective: Seen and examined this morning. Overnight febrile up to 100.5, tachycardic 118-128, tachypneic 20s to 40s, confused removing core track and IV line, needing soft restraints-UA/culture blood culture chest x-ray was repeated. Chest x-ray showed mild residual airspace disease in the right mid to lower lung, aeration slightly improved This morning he is needing restraint.  He is alert awake able to stick his tongue out appears very dry, moving all his extremities, barely able to speak/whispering. Smiling at times  Assessment & Plan:  MRSE endovascular infection per ID plan is for IV vancomycin x6 weeks  Fever/tachycardia: Chest x-ray showed improving aeration.Blood culture recent last night 4/22.  ID following closely continue IV  antibiotics supportive care.Blood pressure is stable no leukocytosis on the having tachycardia as well as intermittent fever.Net negative balance 13 L since admission.  He does appear dry with significant negative balance we will keep him on IV fluid 1 L x  12 hr. Prn iv fluids for tachycardia, BUN still in 55, was in 137 4/9. Recent Labs  Lab 12/29/20 0412 12/30/20 0408 12/31/20 0525 01/01/21 0230 01/02/21 0020  WBC 8.0 8.0 10.2  10.1 9.9 9.3  Net IO Since Admission: -13,000.18 mL [01/02/21 1006]  Filed Weights   12/27/20 0432 12/28/20 0345 12/29/20 0400  Weight: 128.7 kg 127.8 kg 126.7 kg   Septic shock multifactorial 2/2 E. coli pyelonephritis, MSSA pneumonia, MRSE bacteremia with left IJ/[place] septic thrombophlebitis.  Off pressors.  Blood pressure stable but persistently tachycardic intermittent febrile.  On antibiotics see #1.  Acute hypoxic/hypercapnic respiratory failure with MSSA pneumonia: Initially intubated, extubated 4/16.  PCCM recommends BiPAP at bedtime has been needing high flow nasal cannula at 8 L and has been intermittently tachypneic tachycardic.  Chest x-ray done overnight showed slightly improving aeration no new infiltrate.  Pulmonary on board and monitor closely due to tenuous medical/respiratory status.  AKI in the setting of sepsis/ATN/pyelonephritis with hydronephrosis status post left ureteral stent 4/6, repeat ultrasound showed mild left hydronephrosis.  AKI has resolved. Recent Labs  Lab 12/30/20 0408 12/30/20 1746 12/31/20 0525 01/01/21 0230 01/02/21 0020  BUN 59* 50* 49* 52* 55*  CREATININE 1.18 1.09 1.06 1.16 1.20   Acute toxic metabolic encephalopathy/ongoing delirium multifactorial, still very confused needing restraint, delirium precaution supportive measures PT OT.  Checked EKG for QTC  At 487 from 484 4/7.  Subacute versus chronic infarct in imaging MRI on 4/12.  On aspirin and statin.  Continue PT OT SLP on board  NASH cirrhosis will need  outpatient GI follow-up  Essential hypertension blood pressure labile on amlodipine.  Has been tachycardic monitor.  Diabetes mellitus type II on glipizide prior to admission him will A1c 6.8.blood sugar stable, cont lantus 35 u bid, novolog 4 units every 4 hours and sliding scale while on tube feeding Recent Labs  Lab 01/01/21 2036 01/01/21 2356 01/02/21 0102 01/02/21 0412 01/02/21 0759  GLUCAP 122* 154* 143* 135* 151*   Hypernatremia: It has resolved.  On core track tube feeding.  Adjust free water as needed Recent Labs  Lab 12/30/20 0408 12/30/20 1746 12/31/20 0525 01/01/21 0230 01/02/21 0020  NA 153* 151* 148* 146* 145   Left IJ/subclavian DVT versus septic thrombus:on subcu Lovenox twice daily  Chronic microcytic anemia hemoglobin is stable transfuse if less than 7 g  QTC prolonged: Avoid QT prolonging medication maintain electrolytes mag above 2 potassium above 4 and monitor  Abdominal aortic aneurysm 4.8 cm advised 6 months followed by radiology  Morbid obesity with BMI 35.8.  Would benefit weight loss.  Diet Order            DIET DYS 2 Room service appropriate? Yes with Assist; Fluid consistency: Honey Thick  Diet effective now                 Nutrition Problem: Increased nutrient needs Etiology: wound healing Signs/Symptoms: estimated needs Interventions: Tube feeding Patient's Body mass index is 35.86 kg/m.  Pressure Injury 12/21/20 Right Deep Tissue Pressure Injury - Purple or maroon localized area of discolored intact skin or blood-filled blister due to damage of underlying soft tissue from pressure and/or shear. purple with blister area that has torn (Active)  12/21/20 1000  Location:   Location Orientation: Right  Staging: Deep Tissue Pressure Injury - Purple or maroon localized area of discolored intact skin or blood-filled blister due to damage of underlying soft tissue from pressure and/or shear.  Wound Description (Comments): purple with blister  area that has torn  Present on Admission: No     Pressure Injury 12/21/20 Foot Left;Lateral Deep Tissue Pressure Injury - Purple or maroon localized area of discolored intact skin or blood-filled blister due to damage of underlying soft tissue from pressure and/or shear. .5 cm purple area (Active)  12/21/20 0800  Location: Foot  Location Orientation: Left;Lateral  Staging: Deep Tissue Pressure Injury - Purple or maroon localized area of discolored intact skin or blood-filled blister due to damage of underlying soft tissue from pressure and/or shear.  Wound Description (Comments): .5 cm purple area  Present on Admission: No   DVT prophylaxis: SCDs Start: 12/10/20 0737 Lovenox Code Status:   Code Status: Full Code  Family Communication: plan of care discussed with patient at bedside. Called his Spouse's phone no but no answer  Status is: Inpatient Remains inpatient appropriate because:Altered mental status, IV treatments appropriate due to intensity of illness or inability to take PO and Inpatient level of care appropriate due to severity of illness  Dispo: The patient is from: Home              Anticipated d/c is to: tbd              Patient currently is not medically stable to d/c.   Difficult to place patient No Unresulted Labs (From admission, onward)          Start     Ordered   01/02/21 0406  Culture, Urine  Once,   R        01/02/21 0405   01/02/21 0406  Culture, blood (routine x 2)  BLOOD CULTURE X 2,   R      01/02/21 0405   12/31/20 1051  Culture, Respiratory w Gram Stain  Once,   R        12/31/20 1050   12/31/20 0500  CBC  Daily,   R     Question:  Specimen collection method  Answer:  Unit=Unit collect   12/30/20 1014          Medications reviewed:  Scheduled Meds: . amLODipine  5 mg Per Tube Daily  . aspirin  81 mg Per Tube Daily  . atorvastatin  40 mg Per Tube Daily  . chlorhexidine  15 mL Mouth Rinse BID  . Chlorhexidine Gluconate Cloth  6 each Topical Daily   . collagenase   Topical Daily  . docusate  50 mg Per Tube Daily  . enoxaparin (LOVENOX) injection  120 mg Subcutaneous Q12H  . feeding supplement (PROSource TF)  90 mL Per Tube QID  . free water  200 mL Per Tube Q4H  . insulin aspart  0-20 Units Subcutaneous Q4H  . insulin aspart  8 Units Subcutaneous Q4H  . insulin detemir  35 Units Subcutaneous BID  . mouth rinse  15 mL Mouth Rinse q12n4p  . nutrition supplement (JUVEN)  1 packet Per Tube BID BM  . oxyCODONE  5 mg Per Tube Q6H  . polyethylene glycol  17 g Per Tube Daily  . sodium chloride flush  10-40 mL Intracatheter Q12H  . sodium chloride flush  10-40 mL Intracatheter Q12H  . vitamin B-12  100 mcg Per Tube Daily   Continuous Infusions: . sodium chloride    . sodium chloride Stopped (12/31/20 1020)  . feeding supplement (OSMOLITE 1.2 CAL) 1,000 mL (01/01/21 0925)  . vancomycin 500 mg (01/02/21 0527)    Consultants:see note  Procedures:see note  Antimicrobials: Anti-infectives (From admission, onward)   Start     Dose/Rate Route Frequency Ordered Stop   01/01/21 0600  vancomycin (VANCOREADY) IVPB 500 mg/100 mL        500 mg 100 mL/hr over 60 Minutes Intravenous Every 12 hours 12/31/20 1036     12/29/20 0600  vancomycin (VANCOREADY) IVPB 1000 mg/200 mL  Status:  Discontinued        1,000 mg 200 mL/hr over 60 Minutes Intravenous Every 24 hours 12/28/20 0713 12/31/20 1036   12/25/20 0600  vancomycin (VANCOREADY) IVPB 1500 mg/300 mL  Status:  Discontinued        1,500 mg 150 mL/hr over 120 Minutes Intravenous Every 24 hours 12/24/20 1840 12/28/20 0713   12/24/20 1915  vancomycin (VANCOCIN) 2,500 mg in sodium chloride 0.9 % 500 mL IVPB        2,500 mg 250 mL/hr over 120 Minutes Intravenous  Once 12/24/20 1820 12/24/20 2147   12/24/20 1000  cefTRIAXone (ROCEPHIN) 2 g in sodium chloride 0.9 % 100 mL IVPB  Status:  Discontinued        2 g 200 mL/hr over 30 Minutes Intravenous Every 24 hours 12/24/20 0856 12/25/20 1607    12/17/20 2200  cefTRIAXone (ROCEPHIN) 2 g in sodium chloride 0.9 % 100 mL IVPB        2 g 200 mL/hr over 30 Minutes Intravenous Every 24 hours 12/17/20 1123 12/21/20 2135   12/16/20 1100  ceFEPIme (MAXIPIME) 2 g in sodium chloride 0.9 % 100 mL IVPB  Status:  Discontinued        2 g 200 mL/hr over  30 Minutes Intravenous Every 12 hours 12/16/20 0946 12/17/20 1123   12/14/20 1400  ceFAZolin (ANCEF) IVPB 2g/100 mL premix  Status:  Discontinued        2 g 200 mL/hr over 30 Minutes Intravenous Every 8 hours 12/14/20 1223 12/16/20 0946   12/10/20 1530  cefTRIAXone (ROCEPHIN) 2 g in sodium chloride 0.9 % 100 mL IVPB  Status:  Discontinued        2 g 200 mL/hr over 30 Minutes Intravenous Every 24 hours 12/10/20 1436 12/14/20 1223   12/10/20 1200  piperacillin-tazobactam (ZOSYN) IVPB 2.25 g  Status:  Discontinued        2.25 g 100 mL/hr over 30 Minutes Intravenous Every 6 hours 12/10/20 0740 12/10/20 1436   12/10/20 0600  vancomycin (VANCOREADY) IVPB 1500 mg/300 mL        1,500 mg 150 mL/hr over 120 Minutes Intravenous STAT 12/10/20 0546 12/10/20 0821   12/10/20 0400  vancomycin (VANCOREADY) IVPB 1000 mg/200 mL        1,000 mg 200 mL/hr over 60 Minutes Intravenous  Once 12/10/20 0350 12/10/20 0520   12/10/20 0400  piperacillin-tazobactam (ZOSYN) IVPB 3.375 g        3.375 g 12.5 mL/hr over 240 Minutes Intravenous  Once 12/10/20 0350 12/10/20 0450     Culture/Microbiology    Component Value Date/Time   SDES BLOOD RIGHT ANTECUBITAL 12/26/2020 0543   SDES BLOOD RIGHT HAND 12/26/2020 0543   SPECREQUEST  12/26/2020 0543    BOTTLES DRAWN AEROBIC AND ANAEROBIC Blood Culture adequate volume   SPECREQUEST  12/26/2020 0543    BOTTLES DRAWN AEROBIC ONLY Blood Culture results may not be optimal due to an inadequate volume of blood received in culture bottles   CULT  12/26/2020 0543    NO GROWTH 5 DAYS Performed at [facility], [address], [place], [place] [address]    CULT  12/26/2020 0543     NO GROWTH 5 DAYS Performed at [facility], [address], [place], [place] [address]    REPTSTATUS 12/31/2020 FINAL 12/26/2020 0543   REPTSTATUS 12/31/2020 FINAL 12/26/2020 0543    Other culture-see note  Objective: Vitals: Today's Vitals   01/02/21 0054 01/02/21 0413 01/02/21 0503 01/02/21 0812  BP: 140/82 137/79  (!) 145/89  Pulse: (!) 122 (!) 126  (!) 120  Resp: (!) 32 (!) 31  (!) 26  Temp: 99.7 F (37.6 C) 99.1 F (37.3 C)  98.3 F (36.8 C)  TempSrc: Axillary Axillary  Axillary  SpO2: 93% 96%  96%  Weight:      Height:      PainSc:   Asleep     Intake/Output Summary (Last 24 hours) at 01/02/2021 1006 Last data filed at 01/02/2021 0700 Gross per 24 hour  Intake 1197.27 ml  Output 3050 ml  Net -1852.73 ml   Filed Weights   12/27/20 0432 12/28/20 0345 12/29/20 0400  Weight: 128.7 kg 127.8 kg 126.7 kg   Weight change:   Intake/Output from previous day: 04/22 0701 - 04/23 0700 In: 1197.3 [I.V.:50; NG/GT:900; IV Piggyback:247.3] Out: 3050 [Urine:3050] Intake/Output this shift: No intake/output data recorded. Filed Weights   12/27/20 0432 12/28/20 0345 12/29/20 0400  Weight: 128.7 kg 127.8 kg 126.7 kg    Examination: General exam: Alert awake confused, mildly agitated, on 6 L nasal cannula obese, whispering HEENT:Oral mucosa very dry/crusted tonguye, Ear/Nose WNL grossly,dentition normal. Respiratory system: bilaterally good air entry and clear, no use of accessory muscle, non tender.  Cardiovascular system: S1 & S2 +, regular, No JVD. Gastrointestinal system: Abdomen soft, Obese, FOLEY+ NT,ND, BS+. Nervous System:Alert, awake, moving extremities and grossly nonfocal Extremities: No edema, distal peripheral pulses palpable.  Skin: No rashes,no icterus. MSK: Normal muscle bulk,tone, power RUE PICC+ FOLEY+  Data Reviewed: I have personally reviewed following labs and imaging studies CBC: Recent Labs  Lab 12/29/20 0412 12/29/20 0939 12/30/20 0408  12/31/20 0525 01/01/21 0230 01/02/21 0020  WBC 8.0  --  8.0 10.2  10.1 9.9 9.3  NEUTROABS  --   --   --  7.6  --   --   HGB 8.9* 7.8* 8.7* 9.7*  9.5* 8.9* 8.9*  HCT 29.8* 23.0* 29.6* 31.9*  32.1* 28.5* 28.6*  MCV 104.9*  --  105.7* 104.2*  104.2* 101.4* 101.1*  PLT 217  --  198 216  208 180 173   Basic Metabolic Panel: Recent Labs  Lab 12/27/20 0428 12/28/20 0459 12/28/20 1129 12/28/20 1655 12/29/20 0412 12/29/20 0939 12/30/20 0408 12/30/20 1746 12/31/20 0525 01/01/21 0230 01/02/21 0020  NA 155*   < >  --    < > 158*   < > 153* 151* 148* 146* 145  K 3.6   < >  --    < > 3.5   < > 3.2* 4.1 3.9 4.0 4.0  CL 113*   < >  --    < > 123*   < > 119* 118* 117* 111 111  CO2 33*   < >  --    < > 28   < > GLUCOSE 141*   < >  --    < > 176*   < > 191* 206* 185* 188* 166*  BUN 72*   < >  --    < > 76*   < > 59* 50* 49* 52* 55*  CREATININE 1.71*   < >  --    < > 1.50*   < > 1.18 1.09 1.06 1.16 1.20  CALCIUM 9.2   < >  --    < > 8.5*   < > 8.1* 8.6* 8.8* 8.9 8.8*  MG 2.4  --  2.8*  --  2.7*  --  2.5*  --  2.2  --  2.2  PHOS 3.6  --   --   --  2.6  --  2.5  --  3.5  --   --    < > = values in this interval not displayed.   GFR: Estimated Creatinine Clearance: 94.9 mL/min (by C-G formula based on SCr of 1.2 mg/dL). Liver Function Tests: Recent Labs  Lab 12/28/20 0459 12/31/20 1151 01/02/21 0020  AST 33 21 22  ALT ALKPHOS 55 58 62  BILITOT 0.7 0.5 0.5  PROT 7.6 7.4 7.2  ALBUMIN 2.0* 2.3* 2.2*   No results for input(s): LIPASE, AMYLASE in the last 168 hours. Recent Labs  Lab 12/28/20 1129  AMMONIA 26   Coagulation Profile: Recent Labs  Lab 12/27/20 0602  INR 1.3*   Cardiac Enzymes: No results for input(s): CKTOTAL, CKMB, CKMBINDEX, TROPONINI in the last 168 hours. BNP (last 3 results) No results for input(s): PROBNP in the last 8760 hours. HbA1C: No results for input(s): HGBA1C in the last 72 hours. CBG: Recent Labs  Lab  01/01/21 2036 01/01/21 2356 01/02/21 0102 01/02/21 0412 01/02/21 0759  GLUCAP 122* 154* 143* 135* 151*   Lipid Profile: No results for  input(s): CHOL, HDL, LDLCALC, TRIG, CHOLHDL, LDLDIRECT in the last 72 hours. Thyroid Function Tests: No results for input(s): TSH, T4TOTAL, FREET4, T3FREE, THYROIDAB in the last 72 hours. Anemia Panel: No results for input(s): VITAMINB12, FOLATE, FERRITIN, TIBC, IRON, RETICCTPCT in the last 72 hours. Sepsis Labs: No results for input(s): PROCALCITON, LATICACIDVEN in the last 168 hours.  Recent Results (from the past 240 hour(s))  Culture, blood (Routine X 2) w Reflex to ID Panel     Status: Abnormal   Collection Time: 12/25/20  8:07 AM   Specimen: BLOOD LEFT HAND  Result Value Ref Range Status   Specimen Description BLOOD LEFT HAND  Final   Special Requests   Final    BOTTLES DRAWN AEROBIC ONLY Blood Culture results may not be optimal due to an inadequate volume of blood received in culture bottles   Culture  Setup Time   Final    GRAM POSITIVE COCCI IN CLUSTERS AEROBIC BOTTLE ONLY    Culture (A)  Final    STAPHYLOCOCCUS EPIDERMIDIS SUSCEPTIBILITIES PERFORMED ON PREVIOUS CULTURE WITHIN THE LAST 5 DAYS. Performed at [facility], [address] [place], [place] [address]    Report Status 12/27/2020 FINAL  Final  Culture, blood (Routine X 2) w Reflex to ID Panel     Status: Abnormal   Collection Time: 12/25/20  8:12 AM   Specimen: BLOOD RIGHT HAND  Result Value Ref Range Status   Specimen Description BLOOD RIGHT HAND  Final   Special Requests   Final    BOTTLES DRAWN AEROBIC ONLY Blood Culture results may not be optimal due to an inadequate volume of blood received in culture bottles   Culture  Setup Time   Final    GRAM POSITIVE COCCI IN CLUSTERS AEROBIC BOTTLE ONLY CRITICAL RESULT CALLED TO, READ BACK BY AND VERIFIED WITH: PHARMD E.WOLFE AT 1256 ON 12/25/2020 BY T.SAAD Performed at [facility], [address],  [place], [place] [address]    Culture STAPHYLOCOCCUS EPIDERMIDIS (A)  Final   Report Status 12/27/2020 FINAL  Final   Organism ID, Bacteria STAPHYLOCOCCUS EPIDERMIDIS  Final      Susceptibility   Staphylococcus epidermidis - MIC*    CIPROFLOXACIN >=8 RESISTANT Resistant     ERYTHROMYCIN >=8 RESISTANT Resistant     GENTAMICIN 8 INTERMEDIATE Intermediate     OXACILLIN >=4 RESISTANT Resistant     TETRACYCLINE 2 SENSITIVE Sensitive     VANCOMYCIN 1 SENSITIVE Sensitive     TRIMETH/SULFA 80 RESISTANT Resistant     CLINDAMYCIN >=8 RESISTANT Resistant     RIFAMPIN <=0.5 SENSITIVE Sensitive     Inducible Clindamycin NEGATIVE Sensitive     * STAPHYLOCOCCUS EPIDERMIDIS  Blood Culture ID Panel (Reflexed)     Status: Abnormal   Collection Time: 12/25/20  8:12 AM  Result Value Ref Range Status   Enterococcus faecalis NOT DETECTED NOT DETECTED Final   Enterococcus Faecium NOT DETECTED NOT DETECTED Final   Listeria monocytogenes NOT DETECTED NOT DETECTED Final   Staphylococcus species DETECTED (A) NOT DETECTED Final    Comment: CRITICAL RESULT CALLED TO, READ BACK BY AND VERIFIED WITH: PHARMD E.WOLFE AT 1256 ON 12/25/2020 BY T.SAAD.    Staphylococcus aureus (BCID) NOT DETECTED NOT DETECTED Final   Staphylococcus epidermidis DETECTED (A) NOT DETECTED Final    Comment: Methicillin (oxacillin) resistant coagulase negative staphylococcus. Possible blood culture contaminant (unless isolated from more than one blood culture draw or clinical case suggests pathogenicity). No antibiotic treatment is indicated  for blood  culture contaminants.    Staphylococcus lugdunensis NOT DETECTED NOT DETECTED Final   Streptococcus species NOT DETECTED NOT DETECTED Final   Streptococcus agalactiae NOT DETECTED NOT DETECTED Final   Streptococcus pneumoniae NOT DETECTED NOT DETECTED Final   Streptococcus pyogenes NOT DETECTED NOT DETECTED Final   A.calcoaceticus-baumannii NOT DETECTED NOT DETECTED Final   Bacteroides  fragilis NOT DETECTED NOT DETECTED Final   Enterobacterales NOT DETECTED NOT DETECTED Final   Enterobacter cloacae complex NOT DETECTED NOT DETECTED Final   Escherichia coli NOT DETECTED NOT DETECTED Final   Klebsiella aerogenes NOT DETECTED NOT DETECTED Final   Klebsiella oxytoca NOT DETECTED NOT DETECTED Final   Klebsiella pneumoniae NOT DETECTED NOT DETECTED Final   Proteus species NOT DETECTED NOT DETECTED Final   Salmonella species NOT DETECTED NOT DETECTED Final   Serratia marcescens NOT DETECTED NOT DETECTED Final   Haemophilus influenzae NOT DETECTED NOT DETECTED Final   Neisseria meningitidis NOT DETECTED NOT DETECTED Final   Pseudomonas aeruginosa NOT DETECTED NOT DETECTED Final   Stenotrophomonas maltophilia NOT DETECTED NOT DETECTED Final   Candida albicans NOT DETECTED NOT DETECTED Final   Candida auris NOT DETECTED NOT DETECTED Final   Candida glabrata NOT DETECTED NOT DETECTED Final   Candida krusei NOT DETECTED NOT DETECTED Final   Candida parapsilosis NOT DETECTED NOT DETECTED Final   Candida tropicalis NOT DETECTED NOT DETECTED Final   Cryptococcus neoformans/gattii NOT DETECTED NOT DETECTED Final   Methicillin resistance mecA/C DETECTED (A) NOT DETECTED Final    Comment: CRITICAL RESULT CALLED TO, READ BACK BY AND VERIFIED WITH: PHARMD E.WOLFE AT 1256 ON 12/25/2020 BY T.SAAD. Performed at [facility], [address] [place], [place] [address]   Culture, blood (routine x 2)     Status: None   Collection Time: 12/26/20  5:43 AM   Specimen: BLOOD  Result Value Ref Range Status   Specimen Description BLOOD RIGHT ANTECUBITAL  Final   Special Requests   Final    BOTTLES DRAWN AEROBIC AND ANAEROBIC Blood Culture adequate volume   Culture   Final    NO GROWTH 5 DAYS Performed at [facility], [address] [place], [place] [address]    Report Status 12/31/2020 FINAL  Final  Culture, blood (routine x 2)     Status: None   Collection Time: 12/26/20   5:43 AM   Specimen: BLOOD RIGHT HAND  Result Value Ref Range Status   Specimen Description BLOOD RIGHT HAND  Final   Special Requests   Final    BOTTLES DRAWN AEROBIC ONLY Blood Culture results may not be optimal due to an inadequate volume of blood received in culture bottles   Culture   Final    NO GROWTH 5 DAYS Performed at [facility], [address] [place], [place] [address]    Report Status 12/31/2020 FINAL  Final     Radiology Studies: DG CHEST PORT 1 VIEW  Result Date: 01/01/2021 CLINICAL DATA:  Altered mental status EXAM: PORTABLE CHEST 1 VIEW COMPARISON:  12/29/2020, 12/24/2020 FINDINGS: Right-sided central venous catheter tip over the distal SVC. Borderline to mild cardiomegaly. Linear atelectasis or scarring left lower lung. Mild residual airspace disease in the right mid to lower lung. No pneumothorax. Aortic atherosclerosis. Esophageal tube below the diaphragm but incompletely visualized IMPRESSION: Mild residual airspace disease in the right mid to lower lung. Aeration slightly improved. No new airspace opacity Electronically Signed   By: Adrian Prows.D.  On: 01/01/2021 19:07   DG Swallowing Func-Speech Pathology  Result Date: 12/31/2020 Objective Swallowing Evaluation: Type of Study: MBS-Modified Barium Swallow Study  Patient Details Name: Kelvin Sennett MRN: [id number] Date of Birth: 12-31-1960 Today's Date: 12/31/2020 Time: SLP Start Time (ACUTE ONLY): 1036 -SLP Stop Time (ACUTE ONLY): 1059 SLP Time Calculation (min) (ACUTE ONLY): 23 min Past Medical History: No past medical history on file. Past Surgical History: Past Surgical History: Procedure Laterality Date . CYSTOSCOPY W/ URETERAL STENT PLACEMENT Left 12/16/2020  Procedure: CYSTOSCOPY WITH RETROGRADE PYELOGRAM/URETERAL STENT PLACEMENT;  Surgeon: Alfredo Martinez, MD;  Location: [facility] OR;  Service: Urology;  Laterality: Left; HPI: Pt is a 60 year old male who presented 3/31 to the ED with 4-5 day hx of diarrhea and  vomiting. He developed confusion and became belligerent at home. Initial ED evaluation found him to be hypotensive, combative, confused, and he was intubated.  He was noted to have mottling over his trunk and extremities and was  admitted for hypoxemic respiratory failure and septic shock 2/2 E. Coli bacteremia and UTI, and ATN. ETT 3/31-4/5; reintubated 4/5-4/16. 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 CXR 4/14: Worsening pneumonia in both lungs, most confluent at the RIGHT  lung base.  No data recorded Assessment / Plan / Recommendation CHL IP CLINICAL IMPRESSIONS 12/31/2020 Clinical Impression Pt presented with oropharyngeal dysphagia characterized by impaired mastication, reduced lingual retraction, a pharyngeal delay, and reduced anterior laryngeal movement. He demonstrated prolonged mastication with regular texture solids with use of anterior teeth only and a munching pattern. Incomplete epiglottic inversion was often noted with liquids and resulted in reduced airway protection and laryngeal invasion with thin and nectar thick liquids. Mild to moderate vallecular residue was reduced with pt's independent use of secondary swallows. He demonstrated penetration (PAS 3, 5) with intermittent subsequent sensed aspiration (PAS 7) with thin and nectar thick liquids. Depth of penetration was improved to PAS 3 with prompted reduced bolus sizes and prompted coughing was effective in expelling penetration at PAS 3 but not at PAS 5. Pt exhibtied difficulty consistently demonstrating compensatory strategies. A dysphagia 2 diet with honey thick liquids will be initiated at this time. SLP will follow to assess tolerance and for dysphagia treatment. SLP Visit Diagnosis Dysphagia, oropharyngeal phase (R13.12) Attention and concentration deficit following -- Frontal lobe and executive function deficit following -- Impact on safety and function Mild  aspiration risk;Moderate aspiration risk   [facility] IP TREATMENT RECOMMENDATION 12/31/2020 Treatment Recommendations Therapy as outlined in treatment plan below   Prognosis 12/31/2020 Prognosis for Safe Diet Advancement Good Barriers to Reach Goals Severity of deficits Barriers/Prognosis Comment -- [facility] IP DIET RECOMMENDATION 12/31/2020 SLP Diet Recommendations Dysphagia 2 (Fine chop) solids;Honey thick liquids Liquid Administration via Cup;Straw Medication Administration Via alternative means Compensations Slow rate;Small sips/bites;Multiple dry swallows after each bite/sip;Follow solids with liquid Postural Changes Seated upright at 90 degrees;Remain semi-upright after after feeds/meals (Comment)   [facility] IP OTHER RECOMMENDATIONS 12/31/2020 Recommended Consults -- Oral Care Recommendations Oral care BID;Staff/trained caregiver to provide oral care Other Recommendations Order thickener from pharmacy;Prohibited food (jello, ice cream, thin soups)   [facility] IP FOLLOW UP RECOMMENDATIONS 12/31/2020 Follow up Recommendations (No Data)   [facility] IP FREQUENCY AND DURATION 12/31/2020 Speech Therapy Frequency (ACUTE ONLY) min 2x/week Treatment Duration 2 weeks      [facility] IP ORAL PHASE 12/31/2020 Oral Phase Impaired Oral - Pudding Teaspoon -- Oral - Pudding Cup -- Oral - Honey Teaspoon --  Oral - Honey Cup Weak lingual manipulation;Lingual/palatal residue Oral - Nectar Teaspoon -- Oral - Nectar Cup Weak lingual manipulation;Lingual/palatal residue Oral - Nectar Straw Weak lingual manipulation;Lingual/palatal residue Oral - Thin Teaspoon -- Oral - Thin Cup Weak lingual manipulation;Lingual/palatal residue Oral - Thin Straw Weak lingual manipulation;Lingual/palatal residue Oral - Puree Weak lingual manipulation;Lingual/palatal residue Oral - Mech Soft -- Oral - Regular Weak lingual manipulation;Lingual/palatal residue;Impaired mastication Oral - Multi-Consistency -- Oral - Pill -- Oral Phase - Comment --  [facility] IP PHARYNGEAL PHASE 12/31/2020 Pharyngeal  Phase Impaired Pharyngeal- Pudding Teaspoon -- Pharyngeal -- Pharyngeal- Pudding Cup -- Pharyngeal -- Pharyngeal- Honey Teaspoon NT Pharyngeal -- Pharyngeal- Honey Cup Reduced anterior laryngeal mobility;Reduced epiglottic inversion;Pharyngeal residue - valleculae;Reduced tongue base retraction;Reduced airway/laryngeal closure Pharyngeal -- Pharyngeal- Nectar Teaspoon NT Pharyngeal -- Pharyngeal- Nectar Cup Reduced anterior laryngeal mobility;Reduced epiglottic inversion;Reduced tongue base retraction;Reduced airway/laryngeal closure Pharyngeal Material enters airway, remains ABOVE vocal cords and not ejected out;Material enters airway, passes BELOW cords and not ejected out despite cough attempt by patient Pharyngeal- Nectar Straw Reduced anterior laryngeal mobility;Reduced epiglottic inversion;Reduced tongue base retraction;Reduced airway/laryngeal closure Pharyngeal Material enters airway, CONTACTS cords and not ejected out;Material enters airway, passes BELOW cords and not ejected out despite cough attempt by patient Pharyngeal- Thin Teaspoon -- Pharyngeal -- Pharyngeal- Thin Cup Reduced anterior laryngeal mobility;Reduced epiglottic inversion;Reduced tongue base retraction;Reduced airway/laryngeal closure Pharyngeal Material enters airway, CONTACTS cords and not ejected out;Material enters airway, remains ABOVE vocal cords and not ejected out;Material enters airway, passes BELOW cords and not ejected out despite cough attempt by patient Pharyngeal- Thin Straw Reduced anterior laryngeal mobility;Reduced epiglottic inversion;Reduced tongue base retraction;Reduced airway/laryngeal closure Pharyngeal Material enters airway, CONTACTS cords and not ejected out;Material enters airway, remains ABOVE vocal cords and not ejected out;Material enters airway, passes BELOW cords and not ejected out despite cough attempt by patient Pharyngeal- Puree Reduced anterior laryngeal mobility;Reduced epiglottic inversion;Pharyngeal  residue - valleculae;Reduced tongue base retraction;Reduced airway/laryngeal closure Pharyngeal -- Pharyngeal- Mechanical Soft Reduced anterior laryngeal mobility;Reduced epiglottic inversion;Pharyngeal residue - valleculae;Reduced tongue base retraction;Reduced airway/laryngeal closure Pharyngeal -- Pharyngeal- Regular Reduced anterior laryngeal mobility;Reduced epiglottic inversion;Pharyngeal residue - valleculae;Reduced tongue base retraction;Reduced airway/laryngeal closure Pharyngeal -- Pharyngeal- Multi-consistency -- Pharyngeal -- Pharyngeal- Pill Reduced anterior laryngeal mobility;Reduced epiglottic inversion;Pharyngeal residue - valleculae;Reduced tongue base retraction;Reduced airway/laryngeal closure Pharyngeal -- Pharyngeal Comment --  [facility] IP CERVICAL ESOPHAGEAL PHASE 12/31/2020 Cervical Esophageal Phase WFL Pudding Teaspoon -- Pudding Cup -- Honey Teaspoon -- Honey Cup -- Nectar Teaspoon -- Nectar Cup -- Nectar Straw -- Thin Teaspoon -- Thin Cup -- Thin Straw -- Puree -- Mechanical Soft -- Regular -- Multi-consistency -- Pill -- Cervical Esophageal Comment -- Shanika I. Vear Clock, MS, CCC-SLP Acute Rehabilitation Services Office number [phone] Pager [phone] Scheryl Marten 12/31/2020, 1:36 PM                LOS: 23 days   Lanae Boast, MD [facility]  01/02/2021, 10:06 AM

## 2021-01-02 NOTE — Progress Notes (Signed)
HOSPITAL MEDICINE OVERNIGHT EVENT NOTE    Notified by nursing that patient is exhibiting increasing agitation with ongoing red MEWS score.  Chart reviewed, patient is already receiving a 6-week course of intravenous vancomycin for MSSA pneumonia and bacteremia.  Patient is exhibiting continued tachypnea, tachycardia and fever earlier this evening of 100.5 F.  Providing patient with a dose of Tylenol and obtaining repeat pancultures including urine culture, urinalysis and blood cultures with chest x-ray.  In the meantime, to prevent patient from accidentally pulling out lines/hardware we will initiate soft wrist restraint of the right upper extremity.  Continuing to monitor patient closely.  Marinda Elk  MD [facility]

## 2021-01-02 NOTE — Progress Notes (Signed)
RT holding CPAP due to pt having feeding tube in this nose

## 2021-01-02 NOTE — Progress Notes (Signed)
Patient started to get agitated, was pulling out his cortrac and oxygen tubings, Patient remains in IV Vanc. He remains tahcypneic, tachycardia and had slight elevated temp.Marland Kitchen Reached out to oncall for agitation intervention, obtained new restraint orders for right wrist only to prevent patient from pulling out lines/tubings. See Mar.

## 2021-01-02 NOTE — Progress Notes (Signed)
CPAP held tonight due to core track in place.

## 2021-01-02 NOTE — Progress Notes (Signed)
HOSPITAL MEDICINE OVERNIGHT EVENT NOTE    Notified by nursing that patient is exhibiting increasing agitation with ongoing red MEWS score.  Patient is continuing to exhibit ongoing tachycardia and recurrent fevers.  Chart reviewed, patient is already receiving a 6-week course of intravenous vancomycin for MSSA pneumonia and bacteremia.  Patient is already been evaluated by rapid response at the beginning of this evening shift.  Fevers being treated with Tylenol and patient will be reassessed shortly.  Upon review of daytime provider notes, patient has already been pancultured in the past 24 hours with repeat chest x-ray none of which has identified a definitive source of persisting infection.  Daytime providers or treating the patient with vancomycin and otherwise supportive care at this time.  I will continue to monitor patient closely and if patient begins to develop evidence of progressive sepsis such as hypotension I will expand antibiotic coverage to initially cover gram-negative organisms.  Continuing to monitor patient closely.  Marinda Elk  MD [facility]

## 2021-01-02 NOTE — Plan of Care (Signed)
  Problem: Elimination: Goal: Will not experience complications related to bowel motility Outcome: Progressing   Problem: Pain Managment: Goal: General experience of comfort will improve Outcome: Progressing   Problem: Safety: Goal: Ability to remain free from injury will improve Outcome: Progressing   Problem: Safety: Goal: Non-violent Restraint(s) Outcome: Progressing

## 2021-01-02 NOTE — Significant Event (Signed)
Rapid Response Event Note   Reason for Call :  Asked to see pt as a 2nd set of eyes. Pt's MEWS score is 6.   Initial Focused Assessment:  Pt lying in bed with eyes closed. He awakes easily to voice. He is confused but will nod to simple questions, follow simple commands, and move all extremities. He will not speak at this time. He is tachypneic but not labored. Lungs clear and diminished t/o. Skin hot to touch.   T-101.3, HR-116, BP-95/47, RR-25, SpO2-97% on 5L [place]   Interventions:  Tylenol(PRN order)  Plan of Care:  Pt is tachycardia and tachypneic at baseline. He currently has a fever and BP is on the lower side compared to what it has been today. Tx fever and monitor pt closely. Call RRT if further assistance needed.    Event Summary:   MD Notified: RN notified MD of MEWS score PTA RRT Call Time:1929 Arrival Time:1935 End Time:2000  Terrilyn Saver, RN

## 2021-01-03 ENCOUNTER — Inpatient Hospital Stay (HOSPITAL_COMMUNITY): Payer: Medicaid Other

## 2021-01-03 ENCOUNTER — Other Ambulatory Visit (HOSPITAL_COMMUNITY): Payer: Self-pay

## 2021-01-03 LAB — BLOOD CULTURE ID PANEL (REFLEXED) - BCID2

## 2021-01-03 LAB — CBC
HCT: 31.6 % — ABNORMAL LOW (ref 39.0–52.0)
Hemoglobin: 10 g/dL — ABNORMAL LOW (ref 13.0–17.0)
MCH: 31.5 pg (ref 26.0–34.0)
MCHC: 31.6 g/dL (ref 30.0–36.0)
MCV: 99.7 fL (ref 80.0–100.0)
Platelets: 175 10*3/uL (ref 150–400)
RBC: 3.17 MIL/uL — ABNORMAL LOW (ref 4.22–5.81)
RDW: 13.7 % (ref 11.5–15.5)
WBC: 10.8 10*3/uL — ABNORMAL HIGH (ref 4.0–10.5)
nRBC: 0 % (ref 0.0–0.2)

## 2021-01-03 LAB — URINE CULTURE: Culture: NO GROWTH

## 2021-01-03 LAB — GLUCOSE, CAPILLARY
Glucose-Capillary: 175 mg/dL — ABNORMAL HIGH (ref 70–99)
Glucose-Capillary: 170 mg/dL — ABNORMAL HIGH (ref 70–99)
Glucose-Capillary: 144 mg/dL — ABNORMAL HIGH (ref 70–99)
Glucose-Capillary: 157 mg/dL — ABNORMAL HIGH (ref 70–99)
Glucose-Capillary: 132 mg/dL — ABNORMAL HIGH (ref 70–99)
Glucose-Capillary: 158 mg/dL — ABNORMAL HIGH (ref 70–99)
Glucose-Capillary: 174 mg/dL — ABNORMAL HIGH (ref 70–99)

## 2021-01-03 LAB — BASIC METABOLIC PANEL
Anion gap: 8 (ref 5–15)
BUN: 54 mg/dL — ABNORMAL HIGH (ref 6–20)
CO2: 27 mmol/L (ref 22–32)
Calcium: 8.9 mg/dL (ref 8.9–10.3)
Chloride: 108 mmol/L (ref 98–111)
Creatinine, Ser: 1.05 mg/dL (ref 0.61–1.24)
GFR, Estimated: >60 mL/min (ref >60)
Glucose, Bld: 195 mg/dL — ABNORMAL HIGH (ref 70–99)
Potassium: 4.1 mmol/L (ref 3.5–5.1)
Sodium: 143 mmol/L (ref 135–145)

## 2021-01-03 LAB — VANCOMYCIN, TROUGH: Vancomycin Tr: 8 ug/mL — ABNORMAL LOW (ref 15–20)

## 2021-01-03 LAB — VANCOMYCIN, PEAK: Vancomycin Pk: 15 ug/mL — ABNORMAL LOW (ref 30–40)

## 2021-01-03 MED ORDER — VANCOMYCIN HCL 1000 MG/200ML IV SOLN
1000.0000 mg | Freq: Two times a day (BID) | INTRAVENOUS | Status: DC
  Administered 2021-01-04 – 2021-01-05 (×3): 1000 mg via INTRAVENOUS
  Filled 2021-01-03 (×3): qty 200

## 2021-01-03 MED ORDER — SODIUM CHLORIDE 0.9 % IV SOLN: INTRAVENOUS | Status: AC

## 2021-01-03 NOTE — Progress Notes (Signed)
PROGRESS NOTE    [name]  XBM:[id number] DOB: 10-04-1960 DOA: 12/10/2020 PCP: Pcp, No   Chief Complaint  Patient presents with  . Altered Mental Status  Brief Narrative: 60 YO male w/ hypertension, hyperlipidemia presented to the ED 3/31 from home with for 5 days history of diarrhea nausea vomiting and confusion.  Patient was admitted for acute hypoxic aspiratory failure with septic shock due to E. coli bacteremia and UTI and ATN needing intubation pressor support sedation with Precedex/Versed. Patient had prolonged hospitalization complicated course (see previous ICU/transfer note) was managed by critical care, infectious disease for E. coli pyelonephritis complicated by hydronephrosis requiring placement of a stent and bacteremia then MSSA pneumonia,   MRSE endovascular infection, acute hypoxic/hypercapnic respiratory failure/acute toxic metabolic encephalopathy, AKI, left IJ/subclavian DVT versus septic thrombus-managed with Lovenox and ongoing fever/tachycardia..  Patient transferred to [facility] on 01/01/2021-with ongoing confusion/altered mental status and mentation slowly improving, ongoing tachycardia, fevers episodes Cortrack NG feeding, PICC line.  Subjective: Seen and examined this morning. Overnight seen by rapid response and night MD due to persistent tachycardia tachypnea soft blood pressure and intermittent agitation/with confusion HR in 110s, was febrile again 101.3 at 7 pm.  Repeat blood culture from 4/22 has come back positive cortrak in place  He is alert awake able to squeeze hands and thumbs up moving his toes, not able to vocalize-at times whispering, tongue appears very dry. Nursing staff able to remove oxygen off after thorough cleaning of the nose.  He was saturating at 91% on room air  CXR this am:"Platelike atelectasis in the left lower lung is unchanged. Pulmonary vascular congestion appears unchanged from previous exam. Mild patchy airspace densities within the  right midlung appear similar to previous exam."  Assessment & Plan:  MRSE endovascular infection per ID plan is for IV vancomycin x6 weeks.  Patient having persistent fever intermittently and tachycardic blood cultures were drawn again on 4/22- GPC in anerobic bottle-MRSE Vanco level this morning.  I spoke with Dr. Earlene Plater from ID who will be evaluating today.  Fever/tachycardia: Repeat cxr this morning overall unchanged. Blood culture 4/22 1/2 MRSE- ID notified, cont vancomycin.blood pressure is stable but again  tachycardic and febrile.Net negative balance 16 L since admission.  He does appear dry with significant negative balance and gave iv 0.45 NS 1 liter 4/23.  Add normal saline at 75 mill per hour x 12 hr. Monitor. Recent Labs  Lab 12/30/20 0408 12/31/20 0525 01/01/21 0230 01/02/21 0020 01/03/21 0243  WBC 8.0 10.2  10.1 9.9 9.3 10.8*  Net IO Since Admission: -16,654.43 mL [01/03/21 1030]  Filed Weights   12/28/20 0345 12/29/20 0400 01/03/21 0500  Weight: 127.8 kg 126.7 kg 120.6 kg   Septic shock multifactorial 2/2 E. coli pyelonephritis, MSSA pneumonia, MRSE bacteremia with left IJ/[place] septic thrombophlebitis.  Off pressors.  Patient having intermittent fever and persistent bacteremia- MRSE 1/2- ID to see again  Acute hypoxic/hypercapnic respiratory failure with MSSA pneumonia: Initially intubated, extubated 4/16.  PCCM recommends BiPAP at bedtime has been needing high flow nasal cannula at 8 L and has been tachypneic tachycardic.  Chest x-ray 4/24 am mild patchy airspace densities within the right mid lung pulmonary vascular congestion and platelike atelectasis.  He was able to come off oxygen after thorough cleaning of the mouth nursing staff.  Continue continuous pulse ox monitor closely.  Continue pulmonary toileting.   AKI in the setting of sepsis/ATN/pyelonephritis with hydronephrosis status post left ureteral stent 4/6, repeat ultrasound showed mild left hydronephrosis.  AKI  has resolved.  BUN/creatinine stable. Recent Labs  Lab 12/30/20 1746 12/31/20 0525 01/01/21 0230 01/02/21 0020 01/03/21 0243  BUN 50* 49* 52* 55* 54*  CREATININE 1.09 1.06 1.16 1.20 1.05   Acute toxic metabolic encephalopathy/ongoing delirium multifactorial: Patient has remained confused intermittently agitated needing restraints.  He is able to squeeze his rt hand,Cont PT/OT.EKG for QTC  At 487 from 484 4/7.  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 Continue PT OT SLP on board-continue core track NG feeding  NASH cirrhosis:will need outpatient GI follow-up.  Essential hypertension blood pressure is stable on Amlodipine.  Has been tachycardic-once volume status stable can consider switching amlodipine to metoprolol.  Diabetes mellitus type II on glipizide prior to admission him will HBA1c 6.8.blood sugar remains well controlled, continue Lantus 35 u bid, novolog 4 units  AND q4h ssi while on tube feeding Recent Labs  Lab 01/02/21 1602 01/02/21 1925 01/02/21 2309 01/03/21 0300 01/03/21 0737  GLUCAP 151* 143* 135* 175* 170*   Hypernatremia: It has resolved.  Continue to  core track tube feeding.  Adjust free water as needed Recent Labs  Lab 12/30/20 1746 12/31/20 0525 01/01/21 0230 01/02/21 0020 01/03/21 0243  NA 151* 148* 146* 145 143   Left IJ/subclavian DVT versus septic thrombus:Patient has been placed on on subcu Lovenox therapeutic dose by PCCM team.  Chronic microcytic anemia hemoglobin is stable transfuse if less than 7 g  QTC prolonged:Avoid QT prolonging medication maintain electrolytes mag above 2 potassium above 4 and monitor.  Abdominal aortic aneurysm 4.8 cm advised 6 months followed by radiology  Morbid obesity with BMI 35.8.  Would benefit weight loss.  Diet Order             DIET DYS 2 Room service appropriate? Yes with Assist; Fluid consistency: Honey Thick  Diet effective now                 Nutrition Problem: Increased nutrient needs Etiology: wound healing Signs/Symptoms: estimated needs Interventions: Tube feeding Patient's Body mass index is 34.14 kg/m.  Pressure Injury 12/21/20 Right Deep Tissue Pressure Injury - Purple or maroon localized area of discolored intact skin or blood-filled blister due to damage of underlying soft tissue from pressure and/or shear. purple with blister area that has torn (Active)  12/21/20 1000  Location:   Location Orientation: Right  Staging: Deep Tissue Pressure Injury - Purple or maroon localized area of discolored intact skin or blood-filled blister due to damage of underlying soft tissue from pressure and/or shear.  Wound Description (Comments): purple with blister area that has torn  Present on Admission: No     Pressure Injury 12/21/20 Foot Left;Lateral Deep Tissue Pressure Injury - Purple or maroon localized area of discolored intact skin or blood-filled blister due to damage of underlying soft tissue from pressure and/or shear. .5 cm purple area (Active)  12/21/20 0800  Location: Foot  Location Orientation: Left;Lateral  Staging: Deep Tissue Pressure Injury - Purple or maroon localized area of discolored intact skin or blood-filled blister due to damage of underlying soft tissue from pressure and/or shear.  Wound Description (Comments): .5 cm purple area  Present on Admission: No   DVT prophylaxis: SCDs Start: 12/10/20 0737 Lovenox Code Status:   Code Status: Full Code  Family Communication: plan of care discussed with patient  at bedside. Called his Spouse's phone no but no answer 4/23 I was able to get hold of his wife over the phone today and we discussed in detail and updated.  She is aware of what is going on.  I explained that patient remains at high risk and is being monitored very closely with  prognosis remaining guarded. Brother visiting today.  Status UJ:[id number]:Inpatient Remains inpatient appropriate because:Altered mental status, IV treatments appropriate due to intensity of illness or inability to take PO and Inpatient level of care appropriate due to severity of illness  Dispo: The patient is from: Home              Anticipated d/c is to: tbd              Patient currently is not medically stable to d/c.   Difficult to place patient No Unresulted Labs (From admission, onward)          Start     Ordered   01/03/21 1700  Vancomycin, trough  Once,   R       Question:  Specimen collection method  Answer:  Unit=Unit collect   01/03/21 0705   01/03/21 0500  Basic metabolic panel  Daily,   R     Question:  Specimen collection method  Answer:  Unit=Unit collect   01/02/21 1422   12/31/20 1051  Culture, Respiratory w Gram Stain  Once,   R        12/31/20 1050   12/31/20 0500  CBC  Daily,   R     Question:  Specimen collection method  Answer:  Unit=Unit collect   12/30/20 1014          Medications reviewed:  Scheduled Meds: . amLODipine  5 mg Per Tube Daily  . aspirin  81 mg Per Tube Daily  . atorvastatin  40 mg Per Tube Daily  . chlorhexidine  15 mL Mouth Rinse BID  . Chlorhexidine Gluconate Cloth  6 each Topical Daily  . collagenase   Topical Daily  . docusate  50 mg Per Tube Daily  . enoxaparin (LOVENOX) injection  120 mg Subcutaneous Q12H  . feeding supplement (PROSource TF)  90 mL Per Tube QID  . free water  200 mL Per Tube Q4H  . insulin aspart  0-20 Units Subcutaneous Q4H  . insulin aspart  8 Units Subcutaneous Q4H  . insulin detemir  35 Units Subcutaneous BID  . mouth rinse  15 mL Mouth Rinse q12n4p  . nutrition supplement (JUVEN)  1 packet Per Tube BID BM  . oxyCODONE  5 mg Per Tube Q6H  . polyethylene glycol  17 g Per Tube Daily  . sodium chloride flush  10-40 mL Intracatheter Q12H  . sodium chloride flush  10-40 mL Intracatheter Q12H  . vitamin B-12  100  mcg Per Tube Daily   Continuous Infusions: . sodium chloride Stopped (12/31/20 1020)  . feeding supplement (OSMOLITE 1.2 CAL) 1,000 mL (01/02/21 1617)  . vancomycin 500 mg (01/03/21 0534)    Consultants:see note  Procedures:see note  Antimicrobials: Anti-infectives (From admission, onward)   Start     Dose/Rate Route Frequency Ordered Stop   01/01/21 0600  vancomycin (VANCOREADY) IVPB 500 mg/100 mL        500 mg 100 mL/hr over 60 Minutes Intravenous Every 12 hours 12/31/20 1036     12/29/20 0600  vancomycin (VANCOREADY) IVPB 1000 mg/200 mL  Status:  Discontinued  1,000 mg 200 mL/hr over 60 Minutes Intravenous Every 24 hours 12/28/20 0713 12/31/20 1036   12/25/20 0600  vancomycin (VANCOREADY) IVPB 1500 mg/300 mL  Status:  Discontinued        1,500 mg 150 mL/hr over 120 Minutes Intravenous Every 24 hours 12/24/20 1840 12/28/20 0713   12/24/20 1915  vancomycin (VANCOCIN) 2,500 mg in sodium chloride 0.9 % 500 mL IVPB        2,500 mg 250 mL/hr over 120 Minutes Intravenous  Once 12/24/20 1820 12/24/20 2147   12/24/20 1000  cefTRIAXone (ROCEPHIN) 2 g in sodium chloride 0.9 % 100 mL IVPB  Status:  Discontinued        2 g 200 mL/hr over 30 Minutes Intravenous Every 24 hours 12/24/20 0856 12/25/20 1607   12/17/20 2200  cefTRIAXone (ROCEPHIN) 2 g in sodium chloride 0.9 % 100 mL IVPB        2 g 200 mL/hr over 30 Minutes Intravenous Every 24 hours 12/17/20 1123 12/21/20 2135   12/16/20 1100  ceFEPIme (MAXIPIME) 2 g in sodium chloride 0.9 % 100 mL IVPB  Status:  Discontinued        2 g 200 mL/hr over 30 Minutes Intravenous Every 12 hours 12/16/20 0946 12/17/20 1123   12/14/20 1400  ceFAZolin (ANCEF) IVPB 2g/100 mL premix  Status:  Discontinued        2 g 200 mL/hr over 30 Minutes Intravenous Every 8 hours 12/14/20 1223 12/16/20 0946   12/10/20 1530  cefTRIAXone (ROCEPHIN) 2 g in sodium chloride 0.9 % 100 mL IVPB  Status:  Discontinued        2 g 200 mL/hr over 30 Minutes Intravenous  Every 24 hours 12/10/20 1436 12/14/20 1223   12/10/20 1200  piperacillin-tazobactam (ZOSYN) IVPB 2.25 g  Status:  Discontinued        2.25 g 100 mL/hr over 30 Minutes Intravenous Every 6 hours 12/10/20 0740 12/10/20 1436   12/10/20 0600  vancomycin (VANCOREADY) IVPB 1500 mg/300 mL        1,500 mg 150 mL/hr over 120 Minutes Intravenous STAT 12/10/20 0546 12/10/20 0821   12/10/20 0400  vancomycin (VANCOREADY) IVPB 1000 mg/200 mL        1,000 mg 200 mL/hr over 60 Minutes Intravenous  Once 12/10/20 0350 12/10/20 0520   12/10/20 0400  piperacillin-tazobactam (ZOSYN) IVPB 3.375 g        3.375 g 12.5 mL/hr over 240 Minutes Intravenous  Once 12/10/20 0350 12/10/20 0450     Culture/Microbiology    Component Value Date/Time   SDES BLOOD LEFT HAND 01/02/2021 0432   SPECREQUEST  01/02/2021 0432    BOTTLES DRAWN AEROBIC AND ANAEROBIC Blood Culture results may not be optimal due to an inadequate volume of blood received in culture bottles   CULT GRAM POSITIVE COCCI 01/02/2021 0432   REPTSTATUS PENDING 01/02/2021 0432    Other culture-see note  Objective: Vitals: Today's Vitals   01/03/21 0200 01/03/21 0303 01/03/21 0500 01/03/21 0823  BP:  109/75  109/68  Pulse: (!) 115 (!) 115    Resp: (!) 34 (!) 24  (!) 22  Temp:  99.5 F (37.5 C)  (!) 101.4 F (38.6 C)  TempSrc:  Axillary  Axillary  SpO2: 93% 92%  93%  Weight:   120.6 kg   Height:      PainSc:        Intake/Output Summary (Last 24 hours) at 01/03/2021 1030 Last data filed at 01/03/2021 0534 Gross per 24 hour  Intake 1195.75 ml  Output 4850 ml  Net -3654.25 ml   Filed Weights   12/28/20 0345 12/29/20 0400 01/03/21 0500  Weight: 127.8 kg 126.7 kg 120.6 kg   Weight change:   Intake/Output from previous day: 04/23 0701 - 04/24 0700 In: 1195.8 [NG/GT:1195.8] Out: 4850 [Urine:4850] Intake/Output this shift: No intake/output data recorded. Filed Weights   12/28/20 0345 12/29/20 0400 01/03/21 0500  Weight: 127.8 kg 126.7 kg  120.6 kg    Examination: General exam:Alert awake, TACHYPNEICobese, follows commands able to squeeze with the right hand, moving lower extremities. HEENT:Oral mucosa very dry, Ear/Nose WNL grossly, dentition normal. Respiratory system: bilaterally clear, upper airway conducted sounds, no wheezing or crackles,no use of accessory muscle. Cardiovascular system:S1 & S2 +, No JVD,. Gastrointestinal system: Abdomen soft, NT,ND, BS+ Nervous System:Alert, awake, moving LE and RUE. Extremities: No edema, distal peripheral pulses palpable.  Skin:No rashes,no icterus. URK:YHCWCB muscle bulk,tone, power RUE PICC+ FOLEY+  Data Reviewed: I have personally reviewed following labs and imaging studies CBC: Recent Labs  Lab 12/30/20 0408 12/31/20 0525 01/01/21 0230 01/02/21 0020 01/03/21 0243  WBC 8.0 10.2  10.1 9.9 9.3 10.8*  NEUTROABS  --  7.6  --   --   --   HGB 8.7* 9.7*  9.5* 8.9* 8.9* 10.0*  HCT 29.6* 31.9*  32.1* 28.5* 28.6* 31.6*  MCV 105.7* 104.2*  104.2* 101.4* 101.1* 99.7  PLT 198 216  208 180 173 175   Basic Metabolic Panel: Recent Labs  Lab 12/28/20 1129 12/28/20 1655 12/29/20 0412 12/29/20 0939 12/30/20 0408 12/30/20 1746 12/31/20 0525 01/01/21 0230 01/02/21 0020 01/03/21 0243  NA  --    < > 158*   < > 153* 151* 148* 146* 145 143  K  --    < > 3.5   < > 3.2* 4.1 3.9 4.0 4.0 4.1  CL  --    < > 123*   < > 119* 118* 117* 111 111 108  CO2  --    < > 28   < > 26 24 26 28 28 27   GLUCOSE  --    < > 176*   < > 191* 206* 185* 188* 166* 195*  BUN  --    < > 76*   < > 59* 50* 49* 52* 55* 54*  CREATININE  --    < > 1.50*   < > 1.18 1.09 1.06 1.16 1.20 1.05  CALCIUM  --    < > 8.5*   < > 8.1* 8.6* 8.8* 8.9 8.8* 8.9  MG 2.8*  --  2.7*  --  2.5*  --  2.2  --  2.2  --   PHOS  --   --  2.6  --  2.5  --  3.5  --   --   --    < > = values in this interval not displayed.   GFR: Estimated Creatinine Clearance: 105.9 mL/min (by C-G formula based on SCr of 1.05 mg/dL). Liver  Function Tests: Recent Labs  Lab 12/28/20 0459 12/31/20 1151 01/02/21 0020  AST 33 21 22  ALT 18 16 14   ALKPHOS 55 58 62  BILITOT 0.7 0.5 0.5  PROT 7.6 7.4 7.2  ALBUMIN 2.0* 2.3* 2.2*   No results for input(s): LIPASE, AMYLASE in the last 168 hours. Recent Labs  Lab 12/28/20 1129  AMMONIA 26   Coagulation Profile: No results for input(s): INR, PROTIME in the last 168 hours. Cardiac Enzymes: No results for input(s):  CKTOTAL, CKMB, CKMBINDEX, TROPONINI in the last 168 hours. BNP (last 3 results) No results for input(s): PROBNP in the last 8760 hours. HbA1C: No results for input(s): HGBA1C in the last 72 hours. CBG: Recent Labs  Lab 01/02/21 1602 01/02/21 1925 01/02/21 2309 01/03/21 0300 01/03/21 0737  GLUCAP 151* 143* 135* 175* 170*   Lipid Profile: No results for input(s): CHOL, HDL, LDLCALC, TRIG, CHOLHDL, LDLDIRECT in the last 72 hours. Thyroid Function Tests: No results for input(s): TSH, T4TOTAL, FREET4, T3FREE, THYROIDAB in the last 72 hours. Anemia Panel: No results for input(s): VITAMINB12, FOLATE, FERRITIN, TIBC, IRON, RETICCTPCT in the last 72 hours. Sepsis Labs: No results for input(s): PROCALCITON, LATICACIDVEN in the last 168 hours.  Recent Results (from the past 240 hour(s))  Culture, blood (Routine X 2) w Reflex to ID Panel     Status: Abnormal   Collection Time: 12/25/20  8:07 AM   Specimen: BLOOD LEFT HAND  Result Value Ref Range Status   Specimen Description BLOOD LEFT HAND  Final   Special Requests   Final    BOTTLES DRAWN AEROBIC ONLY Blood Culture results may not be optimal due to an inadequate volume of blood received in culture bottles   Culture  Setup Time   Final    GRAM POSITIVE COCCI IN CLUSTERS AEROBIC BOTTLE ONLY    Culture (A)  Final    STAPHYLOCOCCUS EPIDERMIDIS SUSCEPTIBILITIES PERFORMED ON PREVIOUS CULTURE WITHIN THE LAST 5 DAYS. Performed at [facility], [address] [place], [place] [address]    Report Status  12/27/2020 FINAL  Final  Culture, blood (Routine X 2) w Reflex to ID Panel     Status: Abnormal   Collection Time: 12/25/20  8:12 AM   Specimen: BLOOD RIGHT HAND  Result Value Ref Range Status   Specimen Description BLOOD RIGHT HAND  Final   Special Requests   Final    BOTTLES DRAWN AEROBIC ONLY Blood Culture results may not be optimal due to an inadequate volume of blood received in culture bottles   Culture  Setup Time   Final    GRAM POSITIVE COCCI IN CLUSTERS AEROBIC BOTTLE ONLY CRITICAL RESULT CALLED TO, READ BACK BY AND VERIFIED WITH: PHARMD E.WOLFE AT 1256 ON 12/25/2020 BY T.SAAD Performed at [facility], [address] [place], [place] [address]    Culture STAPHYLOCOCCUS EPIDERMIDIS (A)  Final   Report Status 12/27/2020 FINAL  Final   Organism ID, Bacteria STAPHYLOCOCCUS EPIDERMIDIS  Final      Susceptibility   Staphylococcus epidermidis - MIC*    CIPROFLOXACIN >=8 RESISTANT Resistant     ERYTHROMYCIN >=8 RESISTANT Resistant     GENTAMICIN 8 INTERMEDIATE Intermediate     OXACILLIN >=4 RESISTANT Resistant     TETRACYCLINE 2 SENSITIVE Sensitive     VANCOMYCIN 1 SENSITIVE Sensitive     TRIMETH/SULFA 80 RESISTANT Resistant     CLINDAMYCIN >=8 RESISTANT Resistant     RIFAMPIN <=0.5 SENSITIVE Sensitive     Inducible Clindamycin NEGATIVE Sensitive     * STAPHYLOCOCCUS EPIDERMIDIS  Blood Culture ID Panel (Reflexed)     Status: Abnormal   Collection Time: 12/25/20  8:12 AM  Result Value Ref Range Status   Enterococcus faecalis NOT DETECTED NOT DETECTED Final   Enterococcus Faecium NOT DETECTED NOT DETECTED Final   Listeria monocytogenes NOT DETECTED NOT DETECTED Final   Staphylococcus species DETECTED (A) NOT DETECTED Final    Comment: CRITICAL RESULT CALLED TO, READ BACK BY AND  VERIFIED WITH: PHARMD E.WOLFE AT 1256 ON 12/25/2020 BY T.SAAD.    Staphylococcus aureus (BCID) NOT DETECTED NOT DETECTED Final   Staphylococcus epidermidis DETECTED (A) NOT DETECTED Final     Comment: Methicillin (oxacillin) resistant coagulase negative staphylococcus. Possible blood culture contaminant (unless isolated from more than one blood culture draw or clinical case suggests pathogenicity). No antibiotic treatment is indicated for blood  culture contaminants.    Staphylococcus lugdunensis NOT DETECTED NOT DETECTED Final   Streptococcus species NOT DETECTED NOT DETECTED Final   Streptococcus agalactiae NOT DETECTED NOT DETECTED Final   Streptococcus pneumoniae NOT DETECTED NOT DETECTED Final   Streptococcus pyogenes NOT DETECTED NOT DETECTED Final   A.calcoaceticus-baumannii NOT DETECTED NOT DETECTED Final   Bacteroides fragilis NOT DETECTED NOT DETECTED Final   Enterobacterales NOT DETECTED NOT DETECTED Final   Enterobacter cloacae complex NOT DETECTED NOT DETECTED Final   Escherichia coli NOT DETECTED NOT DETECTED Final   Klebsiella aerogenes NOT DETECTED NOT DETECTED Final   Klebsiella oxytoca NOT DETECTED NOT DETECTED Final   Klebsiella pneumoniae NOT DETECTED NOT DETECTED Final   Proteus species NOT DETECTED NOT DETECTED Final   Salmonella species NOT DETECTED NOT DETECTED Final   Serratia marcescens NOT DETECTED NOT DETECTED Final   Haemophilus influenzae NOT DETECTED NOT DETECTED Final   Neisseria meningitidis NOT DETECTED NOT DETECTED Final   Pseudomonas aeruginosa NOT DETECTED NOT DETECTED Final   Stenotrophomonas maltophilia NOT DETECTED NOT DETECTED Final   Candida albicans NOT DETECTED NOT DETECTED Final   Candida auris NOT DETECTED NOT DETECTED Final   Candida glabrata NOT DETECTED NOT DETECTED Final   Candida krusei NOT DETECTED NOT DETECTED Final   Candida parapsilosis NOT DETECTED NOT DETECTED Final   Candida tropicalis NOT DETECTED NOT DETECTED Final   Cryptococcus neoformans/gattii NOT DETECTED NOT DETECTED Final   Methicillin resistance mecA/C DETECTED (A) NOT DETECTED Final    Comment: CRITICAL RESULT CALLED TO, READ BACK BY AND VERIFIED  WITH: PHARMD E.WOLFE AT 1256 ON 12/25/2020 BY T.SAAD. Performed at [facility], [address] [place], [place] [address]   Culture, blood (routine x 2)     Status: None   Collection Time: 12/26/20  5:43 AM   Specimen: BLOOD  Result Value Ref Range Status   Specimen Description BLOOD RIGHT ANTECUBITAL  Final   Special Requests   Final    BOTTLES DRAWN AEROBIC AND ANAEROBIC Blood Culture adequate volume   Culture   Final    NO GROWTH 5 DAYS Performed at [facility], [address] [place], [place] [address]    Report Status 12/31/2020 FINAL  Final  Culture, blood (routine x 2)     Status: None   Collection Time: 12/26/20  5:43 AM   Specimen: BLOOD RIGHT HAND  Result Value Ref Range Status   Specimen Description BLOOD RIGHT HAND  Final   Special Requests   Final    BOTTLES DRAWN AEROBIC ONLY Blood Culture results may not be optimal due to an inadequate volume of blood received in culture bottles   Culture   Final    NO GROWTH 5 DAYS Performed at [facility], [address], [place], [place] [address]    Report Status 12/31/2020 FINAL  Final  Culture, Urine     Status: None   Collection Time: 01/02/21  4:06 AM   Specimen: Urine, Random  Result Value Ref Range Status   Specimen Description URINE, RANDOM  Final   Special Requests  NONE  Final   Culture   Final    NO GROWTH Performed at [facility], [address] [place], [place] [address]    Report Status 01/03/2021 FINAL  Final  Culture, blood (routine x 2)     Status: None (Preliminary result)   Collection Time: 01/02/21  4:19 AM   Specimen: BLOOD LEFT HAND  Result Value Ref Range Status   Specimen Description BLOOD LEFT HAND  Final   Special Requests AEROBIC BOTTLE ONLY Blood Culture adequate volume  Final   Culture  Setup Time   Final    GRAM POSITIVE COCCI IN CLUSTERS AEROBIC BOTTLE ONLY CRITICAL VALUE NOTED.  VALUE IS CONSISTENT WITH PREVIOUSLY REPORTED AND CALLED VALUE. Performed at  [facility], [address], [place], [place] [address]    Culture GRAM POSITIVE COCCI  Final   Report Status PENDING  Incomplete  Culture, blood (routine x 2)     Status: None (Preliminary result)   Collection Time: 01/02/21  4:32 AM   Specimen: BLOOD LEFT HAND  Result Value Ref Range Status   Specimen Description BLOOD LEFT HAND  Final   Special Requests   Final    BOTTLES DRAWN AEROBIC AND ANAEROBIC Blood Culture results may not be optimal due to an inadequate volume of blood received in culture bottles   Culture  Setup Time   Final    GRAM POSITIVE COCCI ANAEROBIC BOTTLE ONLY CRITICAL RESULT CALLED TO, READ BACK BY AND VERIFIED WITH: PHARMD GREG ABBOTT 01/03/2021 AT 0630 A.HUGHES Performed at [facility], [address], [place], [place] [address]    Culture GRAM POSITIVE COCCI  Final   Report Status PENDING  Incomplete  Blood Culture ID Panel (Reflexed)     Status: Abnormal   Collection Time: 01/02/21  4:32 AM  Result Value Ref Range Status   Enterococcus faecalis NOT DETECTED NOT DETECTED Final   Enterococcus Faecium NOT DETECTED NOT DETECTED Final   Listeria monocytogenes NOT DETECTED NOT DETECTED Final   Staphylococcus species DETECTED (A) NOT DETECTED Final    Comment: CRITICAL RESULT CALLED TO, READ BACK BY AND VERIFIED WITH: PHARMD GREG ABBOTT 01/03/2021 AT 0630 A.HUGHES    Staphylococcus aureus (BCID) NOT DETECTED NOT DETECTED Final   Staphylococcus epidermidis DETECTED (A) NOT DETECTED Final    Comment: Methicillin (oxacillin) resistant coagulase negative staphylococcus. Possible blood culture contaminant (unless isolated from more than one blood culture draw or clinical case suggests pathogenicity). No antibiotic treatment is indicated for blood  culture contaminants. CRITICAL RESULT CALLED TO, READ BACK BY AND VERIFIED WITH: PHARMD GREG ABBOTT 01/03/2021 AT 0630 A.HUGHES    Staphylococcus lugdunensis NOT DETECTED NOT DETECTED Final   Streptococcus  species NOT DETECTED NOT DETECTED Final   Streptococcus agalactiae NOT DETECTED NOT DETECTED Final   Streptococcus pneumoniae NOT DETECTED NOT DETECTED Final   Streptococcus pyogenes NOT DETECTED NOT DETECTED Final   A.calcoaceticus-baumannii NOT DETECTED NOT DETECTED Final   Bacteroides fragilis NOT DETECTED NOT DETECTED Final   Enterobacterales NOT DETECTED NOT DETECTED Final   Enterobacter cloacae complex NOT DETECTED NOT DETECTED Final   Escherichia coli NOT DETECTED NOT DETECTED Final   Klebsiella aerogenes NOT DETECTED NOT DETECTED Final   Klebsiella oxytoca NOT DETECTED NOT DETECTED Final   Klebsiella pneumoniae NOT DETECTED NOT DETECTED Final   Proteus species NOT DETECTED NOT DETECTED Final   Salmonella species NOT DETECTED NOT DETECTED Final   Serratia marcescens NOT DETECTED NOT DETECTED Final  Haemophilus influenzae NOT DETECTED NOT DETECTED Final   Neisseria meningitidis NOT DETECTED NOT DETECTED Final   Pseudomonas aeruginosa NOT DETECTED NOT DETECTED Final   Stenotrophomonas maltophilia NOT DETECTED NOT DETECTED Final   Candida albicans NOT DETECTED NOT DETECTED Final   Candida auris NOT DETECTED NOT DETECTED Final   Candida glabrata NOT DETECTED NOT DETECTED Final   Candida krusei NOT DETECTED NOT DETECTED Final   Candida parapsilosis NOT DETECTED NOT DETECTED Final   Candida tropicalis NOT DETECTED NOT DETECTED Final   Cryptococcus neoformans/gattii NOT DETECTED NOT DETECTED Final   Methicillin resistance mecA/C DETECTED (A) NOT DETECTED Final    Comment: CRITICAL RESULT CALLED TO, READ BACK BY AND VERIFIED WITH: PHARMD GREG ABBOTT 01/03/2021 AT 0630 A.HUGHES Performed at [facility], [address] [place], [place] [address]      Radiology Studies: DG Chest Port 1 View  Result Date: 01/03/2021 CLINICAL DATA:  Respiratory failure. EXAM: PORTABLE CHEST 1 VIEW COMPARISON:  01/01/2021 FINDINGS: Right arm PICC line tip is in the cavoatrial junction. Feeding  tube tip is well below the GE junction. Stable cardiomediastinal contours. Platelike atelectasis in the left lower lung is unchanged. Pulmonary vascular congestion appears unchanged from previous exam. Mild patchy airspace densities within the right midlung appear similar to previous exam. IMPRESSION: 1. No change in aeration to the lungs compared with previous exam. 2. Stable support apparatus. Electronically Signed   By: Signa Kell M.D.   On: 01/03/2021 07:24   DG CHEST PORT 1 VIEW  Result Date: 01/01/2021 CLINICAL DATA:  Altered mental status EXAM: PORTABLE CHEST 1 VIEW COMPARISON:  12/29/2020, 12/24/2020 FINDINGS: Right-sided central venous catheter tip over the distal SVC. Borderline to mild cardiomegaly. Linear atelectasis or scarring left lower lung. Mild residual airspace disease in the right mid to lower lung. No pneumothorax. Aortic atherosclerosis. Esophageal tube below the diaphragm but incompletely visualized IMPRESSION: Mild residual airspace disease in the right mid to lower lung. Aeration slightly improved. No new airspace opacity Electronically Signed   By: Jasmine Pang M.D.   On: 01/01/2021 19:07     LOS: 24 days   Lanae Boast, MD [facility]  01/03/2021, 10:30 AM

## 2021-01-03 NOTE — Progress Notes (Signed)
PHARMACY - PHYSICIAN COMMUNICATION CRITICAL VALUE ALERT - BLOOD CULTURE IDENTIFICATION (BCID)  [name] is an 60 y.o. male with AMS, tachycardia, fevers, known MRSE bacteremia   Assessment:   1/2 blood cultures drawn 4/23 growing MRSE.  Name of physician (or Provider) Contacted:  Dr. Jonathon Bellows  Current antibiotics: Vancomycin   Changes to prescribed antibiotics recommended:  No changes at this time.  Will redraw Vancomycin peak and trough today to confirm levels have remained therapeutic.  Results for orders placed or performed during the hospital encounter of 12/10/20  Blood Culture ID Panel (Reflexed) (Collected: 01/02/2021  4:32 AM)  Result Value Ref Range   Enterococcus faecalis NOT DETECTED NOT DETECTED   Enterococcus Faecium NOT DETECTED NOT DETECTED   Listeria monocytogenes NOT DETECTED NOT DETECTED   Staphylococcus species DETECTED (A) NOT DETECTED   Staphylococcus aureus (BCID) NOT DETECTED NOT DETECTED   Staphylococcus epidermidis DETECTED (A) NOT DETECTED   Staphylococcus lugdunensis NOT DETECTED NOT DETECTED   Streptococcus species NOT DETECTED NOT DETECTED   Streptococcus agalactiae NOT DETECTED NOT DETECTED   Streptococcus pneumoniae NOT DETECTED NOT DETECTED   Streptococcus pyogenes NOT DETECTED NOT DETECTED   A.calcoaceticus-baumannii NOT DETECTED NOT DETECTED   Bacteroides fragilis NOT DETECTED NOT DETECTED   Enterobacterales NOT DETECTED NOT DETECTED   Enterobacter cloacae complex NOT DETECTED NOT DETECTED   Escherichia coli NOT DETECTED NOT DETECTED   Klebsiella aerogenes NOT DETECTED NOT DETECTED   Klebsiella oxytoca NOT DETECTED NOT DETECTED   Klebsiella pneumoniae NOT DETECTED NOT DETECTED   Proteus species NOT DETECTED NOT DETECTED   Salmonella species NOT DETECTED NOT DETECTED   Serratia marcescens NOT DETECTED NOT DETECTED   Haemophilus influenzae NOT DETECTED NOT DETECTED   Neisseria meningitidis NOT DETECTED NOT DETECTED   Pseudomonas aeruginosa NOT  DETECTED NOT DETECTED   Stenotrophomonas maltophilia NOT DETECTED NOT DETECTED   Candida albicans NOT DETECTED NOT DETECTED   Candida auris NOT DETECTED NOT DETECTED   Candida glabrata NOT DETECTED NOT DETECTED   Candida krusei NOT DETECTED NOT DETECTED   Candida parapsilosis NOT DETECTED NOT DETECTED   Candida tropicalis NOT DETECTED NOT DETECTED   Cryptococcus neoformans/gattii NOT DETECTED NOT DETECTED   Methicillin resistance mecA/C DETECTED (A) NOT DETECTED    Eddie Candle 01/03/2021  6:40 AM

## 2021-01-03 NOTE — Progress Notes (Signed)
   01/03/21 0823  Assess: MEWS Score  Temp (!) 101.4 F (38.6 C)  BP 109/68  ECG Heart Rate (!) 117  Resp (!) 22  SpO2 93 %  O2 Device HFNC  O2 Flow Rate (L/min) 6 L/min  Assess: MEWS Score  MEWS Temp 1  MEWS Systolic 0  MEWS Pulse 2  MEWS RR 1  MEWS LOC 1  MEWS Score 5  MEWS Score Color Red  Assess: if the MEWS score is Yellow or Red  Were vital signs taken at a resting state? Yes  Focused Assessment No change from prior assessment  Early Detection of Sepsis Score *See Row Information* Low  MEWS guidelines implemented *See Row Information* No, previously red, continue vital signs every 4 hours  Notify: Charge Nurse/RN  Name of Charge Nurse/RN Notified Katie  Date Charge Nurse/RN Notified 01/03/21  Time Charge Nurse/RN Notified 0900  Notify: Provider  Provider Name/Title Ramesh  Date Provider Notified 01/03/21  Time Provider Notified 0900  Notification Type Face-to-face  Notification Reason Other (Comment) (Red MEWS cont.)  Provider response No new orders;At bedside  Date of Provider Response 01/03/21  Time of Provider Response 0900

## 2021-01-03 NOTE — Progress Notes (Signed)
  Speech Language Pathology Treatment: Dysphagia  Patient Details Name: [name] MRN: [id number] DOB: 1961-08-01 Today's Date: 01/03/2021 Time: [id number] SLP Time Calculation (min) (ACUTE ONLY): 17 min  Assessment / Plan / Recommendation Clinical Impression  Pt was seen for dysphagia treatment. Amrah, LPN reported that the pt exhibited coughing with a magic cup today, but positioning was suboptimal due to pt's pain and that breakfast was held due to pt's lethargy. Pt was seen during lunch with meal of ground pot roast, ground green beans, puree, and honey thick liquids. Pt was repositioned and no s/sx of aspiration were noted with solids or liquids. Pt exhibited increased difficulty following commands compared to his performance during the modified barium swallow study and he was notably less conversant. Mastication was minimal and boluses which required mastication intermittently fell out of his mouth. With verbal prompts, mastication improved, but was still inadequate for bolus sizes/consistency. Moderate pocketing was noted with dysphagia 2 solids and mild lingual residue observed. Lingual residue was improved with a liquid wash, but pt was unable to use lingual sweeps and an oral swab was necessary for residue in the left lateral sulcus. Pt was unable to suck from a straw despite prompts and cues. Pt's diet will be modified to dysphagia 1 solids and honey thick liquids will be continued. SLP will continue to follow pt.    HPI HPI: Pt is a 60 year old male who presented 3/31 to the ED with 4-5 day hx of diarrhea and vomiting. He developed confusion and became belligerent at home. Initial ED evaluation found him to be hypotensive, combative, confused, and he was intubated.  He was noted to have mottling over his trunk and extremities and was  admitted for hypoxemic respiratory failure and septic shock 2/2 E. Coli bacteremia and UTI, and ATN. ETT 3/31-4/5; reintubated 4/5-4/16. MRI brain 4/12: 9 mm  late subacute to chronic ischemic infarct involving the  posterior right frontal centrum semi ovale. CXR 4/14: Worsening pneumonia in both lungs, most confluent at the RIGHT  lung base. CXR 4/22: Mild residual airspace disease in the right mid to lower lung.  Aeration slightly improved. No new airspace opacity      SLP Plan  Continue with current plan of care       Recommendations  Diet recommendations: Dysphagia 1 (puree);Honey-thick liquid Liquids provided via: Cup;No straw Medication Administration: Via alternative means Supervision: Staff to assist with self feeding;Full supervision/cueing for compensatory strategies Compensations: Slow rate;Small sips/bites;Follow solids with liquid;Monitor for anterior loss (check left cheek for pocketing) Postural Changes and/or Swallow Maneuvers: Seated upright 90 degrees;Upright 30-60 min after meal                Oral Care Recommendations: Staff/trained caregiver to provide oral care;Oral care BID Follow up Recommendations: LTACH SLP Visit Diagnosis: Dysphagia, oropharyngeal phase (R13.12) Plan: Continue with current plan of care       Destan Franchini I. Vear Clock, MS, CCC-SLP Acute Rehabilitation Services Office number [phone] Pager [phone]                Scheryl Marten 01/03/2021, 12:26 PM

## 2021-01-03 NOTE — Plan of Care (Signed)
  Problem: Education: Goal: Knowledge of General Education information will improve Description: Including pain rating scale, medication(s)/side effects and non-pharmacologic comfort measures Outcome: Progressing   Problem: Health Behavior/Discharge Planning: Goal: Ability to manage health-related needs will improve Outcome: Progressing   Problem: Clinical Measurements: Goal: Ability to maintain clinical measurements within normal limits will improve Outcome: Progressing Goal: Will remain free from infection Outcome: Progressing Goal: Diagnostic test results will improve Outcome: Progressing Goal: Respiratory complications will improve Outcome: Progressing Goal: Cardiovascular complication will be avoided Outcome: Progressing   Problem: Activity: Goal: Risk for activity intolerance will decrease Outcome: Progressing   Problem: Nutrition: Goal: Adequate nutrition will be maintained Outcome: Progressing   Problem: Coping: Goal: Level of anxiety will decrease Outcome: Progressing   Problem: Elimination: Goal: Will not experience complications related to bowel motility Outcome: Progressing Goal: Will not experience complications related to urinary retention Outcome: Progressing   Problem: Pain Managment: Goal: General experience of comfort will improve Outcome: Progressing   Problem: Safety: Goal: Ability to remain free from injury will improve Outcome: Progressing   Problem: Skin Integrity: Goal: Risk for impaired skin integrity will decrease Outcome: Progressing   Problem: Safety: Goal: Non-violent Restraint(s) Outcome: Progressing   Problem: Education: Goal: Knowledge of disease or condition will improve Outcome: Progressing Goal: Knowledge of secondary prevention will improve Outcome: Progressing Goal: Knowledge of patient specific risk factors addressed and post discharge goals established will improve Outcome: Progressing   Problem: Self-Care: Goal:  Verbalization of feelings and concerns over difficulty with self-care will improve Outcome: Progressing   Problem: Nutrition: Goal: Dietary intake will improve Outcome: Progressing   

## 2021-01-03 NOTE — Progress Notes (Signed)
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  Initial blood cultures positive on 12/25/2020 and then subsequently cleared on 12/26/2020.  Has developed recurrence of fevers over the last couple days with repeat blood cultures again showing staph epidermis and multiple sets of cultures. MSSA pneumonia: Noted on tracheal aspirate cultures 12/20/2020 and covered by vancomycin currently.  Repeat chest x-rays with mild residual airspace disease with improved aeration. E. coli bacteremia: In the setting of pyelonephritis complicated by hydronephrosis requiring placement of stent on 12/16/2020 by urology status post antibiotic treatment. Acute kidney injury: Creatinine is currently stable at 1.05 this morning.  PLAN:    Continue vancomycin and ensure therapeutic dosing per pharmacy.  Pending vancomycin peak and trough levels today.  Adjust dose as needed per pharmacy. Will order repeat blood cultures Repeat TTE to start in the setting of high-grade bacteremia.  May need TEE Remove PICC line Obtain upper and lower extremity Dopplers May need surgical intervention given persistent bacteremia despite antimicrobial therapy if vancomycin levels deemed to have been adequate.  Will await pharmacy recommendations but if levels were adequate then would suggest vascular surgery evaluation   Active Problems:   Acute respiratory failure (HCC)   AKI (acute kidney injury) (HCC)   Bacteremia due to Escherichia coli   Ureteral stone   Pyelonephritis   Cerebral thrombosis with cerebral infarction   Staphylococcus epidermidis  bacteremia    MEDICATIONS:    Scheduled Meds: . amLODipine  5 mg Per Tube Daily  . aspirin  81 mg Per Tube Daily  . atorvastatin  40 mg Per Tube Daily  . chlorhexidine  15 mL Mouth Rinse BID  . Chlorhexidine Gluconate Cloth  6 each Topical Daily  . collagenase   Topical Daily  . docusate  50 mg Per Tube Daily  . enoxaparin (LOVENOX) injection  120 mg Subcutaneous Q12H  . feeding supplement (PROSource TF)  90 mL Per Tube QID  . free water  200 mL Per Tube Q4H  . insulin aspart  0-20 Units Subcutaneous Q4H  . insulin aspart  8 Units Subcutaneous Q4H  . insulin detemir  35 Units Subcutaneous BID  . mouth rinse  15 mL Mouth Rinse q12n4p  . nutrition supplement (JUVEN)  1 packet Per Tube BID BM  . oxyCODONE  5 mg Per Tube Q6H  . polyethylene glycol  17 g Per Tube Daily  . sodium chloride flush  10-40 mL Intracatheter Q12H  . sodium chloride flush  10-40 mL Intracatheter Q12H  . vitamin B-12  100 mcg Per Tube Daily   Continuous Infusions: . sodium chloride Stopped (12/31/20 1020)  . feeding supplement (OSMOLITE 1.2 CAL) 1,000 mL (01/02/21 1617)  . vancomycin 500 mg (01/03/21 0534)   PRN Meds:.sodium chloride, acetaminophen (TYLENOL) oral liquid 160 mg/5 mL, acetaminophen, fentaNYL (SUBLIMAZE) injection, hydrALAZINE, ondansetron (ZOFRAN) IV, oxyCODONE, Resource ThickenUp Clear, sodium chloride flush, sodium chloride flush  SUBJECTIVE:   24 hour events:  Previously followed by Dr. Daiva Eves and most recently seen on 01/01/2021.  He was being followed with an initial admission secondary to E. coli pyelonephritis complicated by  hydronephrosis requiring placement of stents and bacteremia.  Subsequently treated for MSSA pneumonia found to have MRSE bacteremia associated with a DVT where left IJ CVC had been placed.  His initial MRSE blood cultures were positive on 4/15 in both sets of blood cultures.  Subsequent blood cultures cleared as of 12/26/2020 and PICC placed 12/29/20.  He was planned to  complete 6 weeks of vancomycin for MRSE endovascular infection and Dr. Daiva Eves signed off on 01/01/2021.  01/01/2021 overnight patient was found to be febrile, tachycardic, tachypneic, and confused requiring soft restraints.  Repeat cultures were obtained as well as imaging with chest x-ray which showed mild residual airspace disease in the right mid to lower lung with aeration slightly improved.  Over the past 24 hours he continues to have fevers with a T-max of 101.4.  He remains tachycardic and tachypneic requiring supplemental oxygen.  His repeat blood cultures that were drawn early in the morning on 01/02/2021 are positive for gram-positive cocci in both sets.  MRSE has again been detected on BC ID.  He continues on vancomycin dosed per pharmacy who recommended peak and trough levels today to confirm that he has remained therapeutic.  We have been consulted for further recommendations.   Review of Systems  Unable to perform ROS: Mental status change      OBJECTIVE:   Blood pressure 109/68, pulse (!) 115, temperature (!) 101.4 F (38.6 C), temperature source Axillary, resp. rate (!) 22, height 6\' 2"  (1.88 m), weight 120.6 kg, SpO2 93 %. Body mass index is 34.14 kg/m.  Physical Exam Constitutional:      Comments: Obese, chronically ill-appearing man, lying in bed  HENT:     Head: Normocephalic and atraumatic.  Eyes:     Comments: Opens eyes to voice and stimuli but then drifts back off to sleep.  Cardiovascular:     Rate and Rhythm: Regular rhythm. Tachycardia present.     Heart sounds: No murmur heard.     Comments: No appreciable murmur, however, heart sounds distant due to habitus Pulmonary:     Breath sounds: No wheezing.     Comments: Coarse breath sounds bilaterally with increased work of breathing. Abdominal:     Palpations: Abdomen is soft.     Tenderness: There is no abdominal tenderness. There is no guarding or rebound.  Musculoskeletal:     Cervical back: Neck supple.  No rigidity.     Comments: Trace lower extremity edema. Right upper extremity PICC line without evidence of cellulitis.  Skin:    General: Skin is warm and dry.     Findings: No erythema.  Neurological:     Comments: Lethargic.  Opens eyes spontaneously to voice and painful stimuli but does not otherwise provide meaningful responses.  Psychiatric:     Comments: Unable to assess.      Lab Results: Lab Results  Component Value Date   WBC 10.8 (H) 01/03/2021   HGB 10.0 (L) 01/03/2021   HCT 31.6 (L) 01/03/2021   MCV 99.7 01/03/2021   PLT 175 01/03/2021    Lab Results  Component Value Date   NA 143 01/03/2021   K 4.1 01/03/2021   CO2 27 01/03/2021   GLUCOSE 195 (H) 01/03/2021   BUN 54 (H) 01/03/2021   CREATININE 1.05 01/03/2021   CALCIUM 8.9 01/03/2021   GFRNONAA >60 01/03/2021    Lab Results  Component Value Date   ALT 14 01/02/2021   AST 22 01/02/2021   ALKPHOS 62 01/02/2021  BILITOT 0.5 01/02/2021    No results found for: CRP  No results found for: ESRSEDRATE   I have reviewed the micro and lab results in Epic.  Imaging: DG Chest Port 1 View  Result Date: 01/03/2021 CLINICAL DATA:  Respiratory failure. EXAM: PORTABLE CHEST 1 VIEW COMPARISON:  01/01/2021 FINDINGS: Right arm PICC line tip is in the cavoatrial junction. Feeding tube tip is well below the GE junction. Stable cardiomediastinal contours. Platelike atelectasis in the left lower lung is unchanged. Pulmonary vascular congestion appears unchanged from previous exam. Mild patchy airspace densities within the right midlung appear similar to previous exam. IMPRESSION: 1. No change in aeration to the lungs compared with previous exam. 2. Stable support apparatus. Electronically Signed   By: Signa Kell M.D.   On: 01/03/2021 07:24   DG CHEST PORT 1 VIEW  Result Date: 01/01/2021 CLINICAL DATA:  Altered mental status EXAM: PORTABLE CHEST 1 VIEW COMPARISON:  12/29/2020, 12/24/2020 FINDINGS: Right-sided central  venous catheter tip over the distal SVC. Borderline to mild cardiomegaly. Linear atelectasis or scarring left lower lung. Mild residual airspace disease in the right mid to lower lung. No pneumothorax. Aortic atherosclerosis. Esophageal tube below the diaphragm but incompletely visualized IMPRESSION: Mild residual airspace disease in the right mid to lower lung. Aeration slightly improved. No new airspace opacity Electronically Signed   By: Jasmine Pang M.D.   On: 01/01/2021 19:07     Imaging  independently reviewed in Epic.    Vedia Coffer for Infectious Disease [facility] [phone] pager 01/03/2021, 9:06 AM  I spent greater than 35 minutes with the patient including greater than 50% of time in face to face counsel of the patient and in coordination of their care.

## 2021-01-03 NOTE — Progress Notes (Signed)
Pharmacy Antibiotic Note  [name] is a 60 y.o. male admitted on 12/10/2020 with hypoxemic respiratory failure and septic shock 2/2 E. Coli bacteremia and UTI, and ATN, found to have MRSE bacteremia.  Pharmacy has been consulted for vancomycin dosing.  Scr remains stable at 1.05. Vancomycin peak 15, trough 8. Calculated AUC is subtherapeutic at 278 (goal 400-600) on 500 mg q12h.   Plan: Adjust vancomycin to 1000 mg q12h (eAUC 487) Monitor Scr, vancomycin levels as indicated  Height: _0  (188 cm) Weight: 120.6 kg (265 lb 14 oz) IBW/kg (Calculated) : 82.2  Temp (24hrs), Avg:100 F (37.8 C), Min:97.8 F (36.6 C), Max:101.7 F (38.7 C)  Recent Labs  Lab 12/30/20 0408 12/30/20 1746 12/31/20 0525 12/31/20 0528 12/31/20 0759 01/01/21 0230 01/02/21 0020 01/03/21 0243 01/03/21 0706 01/03/21 1748  WBC 8.0  --  10.2  10.1  --   --  9.9 9.3 10.8*  --   --   CREATININE 1.18 1.09 1.06  --   --  1.16 1.20 1.05  --   --   VANCOTROUGH  --   --   --  10*  --   --   --   --   --  8*  VANCOPEAK  --   --   --   --  44*  --   --   --  15*  --     Estimated Creatinine Clearance: 105.9 mL/min (by C-G formula based on SCr of 1.05 mg/dL).    Allergies  Allergen Reactions  . Ibuprofen Other (See Comments)    Blood in the stool.  . Metformin Other (See Comments)    Tremors, muscles  locking up, unable to control extremities    Antimicrobials this admission: Zosyn 3/31>>3/31 Rocephin 3/31>> 4/4; 4/14>>4/15 Vanc 4/14 >> Ancef 4/4 >> 4/6 Cefepime 4/6 >> 4/7 CTX 4/7 >> 4/11  Dose adjustments this admission: 4/17 VP 40; VT 19 > calc AUC 705 on 1500 q24h > adj to 1000 q24h (eAUC 470) 4/21 VP 44; VT 10 > calc AUC 578 on 1000 q24h > adj to 500 mg q12h (eAUC 587) 4/24 VP 15; VT 8 > calc AUC 278 on 500 q12h > adj to 1g q12 (eAUC 487)  Microbiology results: 3/31 BCx Ecoli (pan sens) 3/31 UCx Ecoli (pan sens) 4/5 RCx (TA) >> NOF 4/5 BAL cx >> NOF 4/10 resp: mssa 4/13 Bcx - MRSE 4/16  bcx: ngtd  4/23 bcx: MRSE  Thank you for allowing pharmacy to be a part of this patient's care.  Antonietta Jewel, PharmD, [place] Clinical Pharmacist  Phone: [phone] 01/03/2021 10:13 PM  Please check AMION for all [place] phone numbers After 10:00 PM, call [place] [phone]

## 2021-01-03 NOTE — Progress Notes (Signed)
Due to safely concerns, patient will not be placed on CPAP. Patient is only alert and oriented to self and may not be able to pull mask off.

## 2021-01-04 ENCOUNTER — Inpatient Hospital Stay (HOSPITAL_COMMUNITY): Payer: Medicaid Other

## 2021-01-04 ENCOUNTER — Inpatient Hospital Stay (HOSPITAL_COMMUNITY): Payer: Self-pay

## 2021-01-04 DIAGNOSIS — R509 Fever, unspecified: Secondary | ICD-10-CM

## 2021-01-04 DIAGNOSIS — R7881 Bacteremia: Secondary | ICD-10-CM

## 2021-01-04 DIAGNOSIS — L8990 Pressure ulcer of unspecified site, unspecified stage: Secondary | ICD-10-CM | POA: Insufficient documentation

## 2021-01-04 DIAGNOSIS — Z5181 Encounter for therapeutic drug level monitoring: Secondary | ICD-10-CM

## 2021-01-04 DIAGNOSIS — K7460 Unspecified cirrhosis of liver: Secondary | ICD-10-CM

## 2021-01-04 LAB — CBC
HCT: 29.9 % — ABNORMAL LOW (ref 39.0–52.0)
Hemoglobin: 9.5 g/dL — ABNORMAL LOW (ref 13.0–17.0)
MCH: 31.4 pg (ref 26.0–34.0)
MCHC: 31.8 g/dL (ref 30.0–36.0)
MCV: 98.7 fL (ref 80.0–100.0)
Platelets: 190 10*3/uL (ref 150–400)
RBC: 3.03 MIL/uL — ABNORMAL LOW (ref 4.22–5.81)
RDW: 13.7 % (ref 11.5–15.5)
WBC: 11.7 10*3/uL — ABNORMAL HIGH (ref 4.0–10.5)
nRBC: 0 % (ref 0.0–0.2)

## 2021-01-04 LAB — BLOOD CULTURE ID PANEL (REFLEXED) - BCID2

## 2021-01-04 LAB — ECHOCARDIOGRAM LIMITED
AV Mean grad: 8 mmHg
AV Peak grad: 14.7 mmHg
Ao pk vel: 1.92 m/s
Height: 74 in
Weight: 4254 oz

## 2021-01-04 LAB — BASIC METABOLIC PANEL
Anion gap: 9 (ref 5–15)
BUN: 57 mg/dL — ABNORMAL HIGH (ref 6–20)
CO2: 27 mmol/L (ref 22–32)
Calcium: 8.7 mg/dL — ABNORMAL LOW (ref 8.9–10.3)
Chloride: 108 mmol/L (ref 98–111)
Creatinine, Ser: 0.99 mg/dL (ref 0.61–1.24)
GFR, Estimated: >60 mL/min (ref >60)
Glucose, Bld: 106 mg/dL — ABNORMAL HIGH (ref 70–99)
Potassium: 3.4 mmol/L — ABNORMAL LOW (ref 3.5–5.1)
Sodium: 144 mmol/L (ref 135–145)

## 2021-01-04 LAB — GLUCOSE, CAPILLARY
Glucose-Capillary: 98 mg/dL (ref 70–99)
Glucose-Capillary: 127 mg/dL — ABNORMAL HIGH (ref 70–99)
Glucose-Capillary: 162 mg/dL — ABNORMAL HIGH (ref 70–99)
Glucose-Capillary: 100 mg/dL — ABNORMAL HIGH (ref 70–99)
Glucose-Capillary: 211 mg/dL — ABNORMAL HIGH (ref 70–99)
Glucose-Capillary: 141 mg/dL — ABNORMAL HIGH (ref 70–99)
Glucose-Capillary: 128 mg/dL — ABNORMAL HIGH (ref 70–99)

## 2021-01-04 LAB — AMMONIA: Ammonia: 34 umol/L (ref 9–35)

## 2021-01-04 MED ORDER — SODIUM CHLORIDE 0.9 % IV SOLN
2.0000 g | INTRAVENOUS | Status: DC
  Administered 2021-01-05 – 2021-01-06 (×2): 2 g via INTRAVENOUS
  Filled 2021-01-04: qty 2
  Filled 2021-01-04 (×2): qty 20

## 2021-01-04 MED ORDER — CARVEDILOL 3.125 MG PO TABS
3.1250 mg | ORAL_TABLET | Freq: Two times a day (BID) | ORAL | Status: DC
  Administered 2021-01-05 – 2021-02-02 (×56): 3.125 mg via ORAL
  Filled 2021-01-04 (×59): qty 1

## 2021-01-04 MED ORDER — SODIUM CHLORIDE 0.9 % IV SOLN
2.0000 g | Freq: Once | INTRAVENOUS | Status: AC
  Administered 2021-01-04: 2 g via INTRAVENOUS
  Filled 2021-01-04: qty 20

## 2021-01-04 MED ORDER — PERFLUTREN LIPID MICROSPHERE
1.0000 mL | INTRAVENOUS | Status: AC | PRN
  Filled 2021-01-04: qty 10

## 2021-01-04 NOTE — Progress Notes (Signed)
PROGRESS NOTE    [name]  EXN:[id number] DOB: 05-Feb-1961 DOA: 12/10/2020 PCP: Pcp, No   Chief Complaint  Patient presents with  . Altered Mental Status  Brief Narrative: 60 YO male w/ hypertension, hyperlipidemia presented to the ED 3/31 from home with for 5 days history of diarrhea nausea vomiting and confusion.  Patient was admitted for acute hypoxic aspiratory failure with septic shock due to E. coli bacteremia and UTI and ATN needing intubation pressor support sedation with Precedex/Versed. Patient had prolonged hospitalization complicated course (see previous ICU/transfer note) was managed by critical care, infectious disease for E. coli pyelonephritis complicated by hydronephrosis requiring placement of a stent and bacteremia then MSSA pneumonia,   MRSE endovascular infection, acute hypoxic/hypercapnic respiratory failure/acute toxic metabolic encephalopathy, AKI, left IJ/subclavian DVT versus septic thrombus-managed with Lovenox and ongoing fever/tachycardia.. Patient transferred to [facility] on 01/01/[facility]21-with ongoing confusion/altered mental status and mentation slowly improving, ongoing tachycardia, fevers episodes Cortrack NG feeding, PICC line.  Subjective: Seen and examined this morning.  He remains on room air.  Low-grade fever this morning 100.5 less recurrent fever. last temperature spike was yesterday afternoon His Vanco trough and peak level are subtherapeutic 4/24 Right wrist remains on restraint, left arm is weak and moving LE ON CORTRAK NG FEEDING Patient is able to follow commands able to whisper able to move both his extremities on command and right upper extremity. He is on room air. Nursing reports he has been having pain and remains tachycardic tachypneic  Assessment & Plan:  MRSE endovascular infection per ID plan is for IV vancomycin x6 weeks.  Patient has been intermittently febrile, tachycardic tachypneic due to ongoing bacteremia. Vanco trough and peak level  subtherapeutic 4/24, await for further ID inputs?  Vascular consultation, PICC line has been removed due to persistent bacteremia 4/24 repeat blood culture sent 4/24-having gram-negative rods, ceftriaxone added. Duplex scan and echocardiogram ordered we will follow-up.   Tachycardia-mostly from fever but also contributed by negative balance dehydration he has been getting as needed IV fluids.  Currently on tube feeding 75 cc/h plus free water 200 mL every 4 hours so we will continue on the current regimen.  Monitoring telemetry.    Septic shock multifactorial 2/2 E. coli pyelonephritis, MSSA pneumonia, MRSE bacteremia with left IJ/ septic thrombophlebitis.  Off pressors.  Patient having intermittent fever and persistent bacteremia- MRSE 1/2- ID to see again  Acute hypoxic/hypercapnic respiratory failure with MSSA pneumonia: Initially intubated, extubated 4/16.  PCCM recommends BiPAP at bedtime has been needing high flow nasal cannula at 8 L and has been tachypneic tachycardic.  Chest x-ray 4/24 am mild patchy airspace densities within the right mid lung pulmonary vascular congestion and platelike atelectasis.  He has been weaned to room air 4/24.  Remains on room air.  But he has been tachypneic, monitor closely.  PCCM signed off after chart review this morning 4/25  AKI in the setting of sepsis/ATN/pyelonephritis with hydronephrosis status post left ureteral stent 4/6, repeat ultrasound showed mild left hydronephrosis.  AKI has resolved. BUN/creatinine stable.  He has a Foley in place due to sacral wound. Recent Labs  Lab 12/31/20 0525 01/01/21 0230 01/02/21 0020 01/03/21 0243 01/04/21 0247  BUN 49* 52* 55* 54* 57*  CREATININE 1.06 1.16 1.20 1.05 0.99   Acute toxic metabolic encephalopathy/ongoing delirium multifactorial in the setting of septic shock, stroke.  Able to verbalize more follow commands.  Right wrist to remain on restraint weakness on the left upper arm.  Continue on supportive  measures fall precaution.  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 Continue PT OT SLP on board-continue core track NG feeding  NASH cirrhosis:will need outpatient GI follow-up.  Essential hypertension BP controlled on amlodipine.Has been tachycardic-volume status is stable  so we will switch him to Coreg.    Diabetes mellitus type II on glipizide prior to admission him will HBA1c 6.8.blood sugar is controlled on Lantus 35 u bid, novolog 4 units  AND q4h ssi while on tube feeding Recent Labs  Lab 01/03/21 2031 01/03/21 2259 01/04/21 0313 01/04/21 0518 01/04/21 0812  GLUCAP 158* 174* 98 127* 162*   Hypernatremia: Resolved.  Continue free water through core track  Recent Labs  Lab 12/31/20 0525 01/01/21 0230 01/02/21 0020 01/03/21 0243 01/04/21 0247  NA 148* 146* 145 143 144   Left IJ/subclavian DVT versus septic thrombus:Patient has been placed on on subcu Lovenox therapeutic dose by PCCM team.  Follow-up duplex.  PICC line has been removed 4/24 from right upper extremity due to persistent bacteremia  Chronic microcytic anemia hemoglobin is stable transfuse if less than 7 g  QTC prolonged:Avoid QT prolonging medication maintain electrolytes mag above 2 potassium above 4 and monitor.  Abdominal aortic aneurysm 4.8 cm advised 6 months followed by radiology  Morbid obesity with BMI 35.8.  Would benefit weight loss.  Patient has a deep tissue injury sacral wound on hydrotherapy  Diet Order            DIET - DYS 1 Room service appropriate? No; Fluid consistency: Honey Thick  Diet effective now                 Nutrition Problem: Increased nutrient needs Etiology: wound healing Signs/Symptoms: estimated needs Interventions: Tube feeding Patient's Body mass index is 34.14 kg/m.  Pressure Injury 12/21/20 Right Deep Tissue  Pressure Injury - Purple or maroon localized area of discolored intact skin or blood-filled blister due to damage of underlying soft tissue from pressure and/or shear. purple with blister area that has torn (Active)  12/21/20 1000  Location:   Location Orientation: Right  Staging: Deep Tissue Pressure Injury - Purple or maroon localized area of discolored intact skin or blood-filled blister due to damage of underlying soft tissue from pressure and/or shear.  Wound Description (Comments): purple with blister area that has torn  Present on Admission: No     Pressure Injury 12/21/20 Foot Left;Lateral Deep Tissue Pressure Injury - Purple or maroon localized area of discolored intact skin or blood-filled blister due to damage of underlying soft tissue from pressure and/or shear. .5 cm purple area (Active)  12/21/20 0800  Location: Foot  Location Orientation: Left;Lateral  Staging: Deep Tissue Pressure Injury - Purple or maroon localized area of discolored intact skin or blood-filled blister due to damage of underlying soft tissue from pressure and/or shear.  Wound Description (Comments): .5 cm purple area  Present on Admission: No   DVT prophylaxis: SCDs Start: 12/10/20 0737 Lovenox Code Status:   Code Status: Full Code  Family Communication: plan of care discussed with patient at bedside. Called his Spouse's phone no but no answer 4/23 Prognosis is guarded, family updated extensively   Status ZO:[id number] Remains inpatient appropriate because:Altered mental status, IV treatments appropriate due to intensity of illness or inability to take PO and Inpatient level of care appropriate due to severity of illness  Dispo: The  patient is from: Home              Anticipated d/c is to: tbd              Patient currently is not medically stable to d/c.   Difficult to place patient No Unresulted Labs (From admission, onward)          Start     Ordered   01/03/21 0500  Basic metabolic panel  Daily,    R     Question:  Specimen collection method  Answer:  Unit=Unit collect   01/02/21 1422   12/31/20 1051  Culture, Respiratory w Gram Stain  Once,   R        12/31/20 1050          Medications reviewed:  Scheduled Meds: . amLODipine  5 mg Per Tube Daily  . aspirin  81 mg Per Tube Daily  . atorvastatin  40 mg Per Tube Daily  . chlorhexidine  15 mL Mouth Rinse BID  . Chlorhexidine Gluconate Cloth  6 each Topical Daily  . collagenase   Topical Daily  . docusate  50 mg Per Tube Daily  . enoxaparin (LOVENOX) injection  120 mg Subcutaneous Q12H  . feeding supplement (PROSource TF)  90 mL Per Tube QID  . free water  200 mL Per Tube Q4H  . insulin aspart  0-20 Units Subcutaneous Q4H  . insulin aspart  8 Units Subcutaneous Q4H  . insulin detemir  35 Units Subcutaneous BID  . mouth rinse  15 mL Mouth Rinse q12n4p  . nutrition supplement (JUVEN)  1 packet Per Tube BID BM  . oxyCODONE  5 mg Per Tube Q6H  . polyethylene glycol  17 g Per Tube Daily  . sodium chloride flush  10-40 mL Intracatheter Q12H  . sodium chloride flush  10-40 mL Intracatheter Q12H  . vitamin B-12  100 mcg Per Tube Daily   Continuous Infusions: . sodium chloride Stopped (12/31/20 1020)  . [START ON 01/05/2021] cefTRIAXone (ROCEPHIN)  IV    . feeding supplement (OSMOLITE 1.2 CAL) 1,000 mL (01/03/21 2148)  . vancomycin 1,000 mg (01/04/21 0310)    Consultants:see note  Procedures:see note  Antimicrobials: Anti-infectives (From admission, onward)   Start     Dose/Rate Route Frequency Ordered Stop   01/05/21 0800  cefTRIAXone (ROCEPHIN) 2 g in sodium chloride 0.9 % 100 mL IVPB        2 g 200 mL/hr over 30 Minutes Intravenous Every 24 hours 01/04/21 0436     01/04/21 0530  cefTRIAXone (ROCEPHIN) 2 g in sodium chloride 0.9 % 100 mL IVPB        2 g 200 mL/hr over 30 Minutes Intravenous  Once 01/04/21 0436 01/04/21 0709   01/04/21 0500  vancomycin (VANCOREADY) IVPB 1000 mg/200 mL        1,000 mg 200 mL/hr over 60  Minutes Intravenous Every 12 hours 01/03/21 2216     01/01/21 0600  vancomycin (VANCOREADY) IVPB 500 mg/100 mL  Status:  Discontinued        500 mg 100 mL/hr over 60 Minutes Intravenous Every 12 hours 12/31/20 1036 01/03/21 2216   12/29/20 0600  vancomycin (VANCOREADY) IVPB 1000 mg/200 mL  Status:  Discontinued        1,000 mg 200 mL/hr over 60 Minutes Intravenous Every 24 hours 12/28/20 0713 12/31/20 1036   12/25/20 0600  vancomycin (VANCOREADY) IVPB 1500 mg/300 mL  Status:  Discontinued  1,500 mg 150 mL/hr over 120 Minutes Intravenous Every 24 hours 12/24/20 1840 12/28/20 0713   12/24/20 1915  vancomycin (VANCOCIN) 2,500 mg in sodium chloride 0.9 % 500 mL IVPB        2,500 mg 250 mL/hr over 120 Minutes Intravenous  Once 12/24/20 1820 12/24/20 2147   12/24/20 1000  cefTRIAXone (ROCEPHIN) 2 g in sodium chloride 0.9 % 100 mL IVPB  Status:  Discontinued        2 g 200 mL/hr over 30 Minutes Intravenous Every 24 hours 12/24/20 0856 12/25/20 1607   12/17/20 2200  cefTRIAXone (ROCEPHIN) 2 g in sodium chloride 0.9 % 100 mL IVPB        2 g 200 mL/hr over 30 Minutes Intravenous Every 24 hours 12/17/20 1123 12/21/20 2135   12/16/20 1100  ceFEPIme (MAXIPIME) 2 g in sodium chloride 0.9 % 100 mL IVPB  Status:  Discontinued        2 g 200 mL/hr over 30 Minutes Intravenous Every 12 hours 12/16/20 0946 12/17/20 1123   12/14/20 1400  ceFAZolin (ANCEF) IVPB 2g/100 mL premix  Status:  Discontinued        2 g 200 mL/hr over 30 Minutes Intravenous Every 8 hours 12/14/20 1223 12/16/20 0946   12/10/20 1530  cefTRIAXone (ROCEPHIN) 2 g in sodium chloride 0.9 % 100 mL IVPB  Status:  Discontinued        2 g 200 mL/hr over 30 Minutes Intravenous Every 24 hours 12/10/20 1436 12/14/20 1223   12/10/20 1200  piperacillin-tazobactam (ZOSYN) IVPB 2.25 g  Status:  Discontinued        2.25 g 100 mL/hr over 30 Minutes Intravenous Every 6 hours 12/10/20 0740 12/10/20 1436   12/10/20 0600  vancomycin (VANCOREADY) IVPB  1500 mg/300 mL        1,500 mg 150 mL/hr over 120 Minutes Intravenous STAT 12/10/20 0546 12/10/20 0821   12/10/20 0400  vancomycin (VANCOREADY) IVPB 1000 mg/200 mL        1,000 mg 200 mL/hr over 60 Minutes Intravenous  Once 12/10/20 0350 12/10/20 0520   12/10/20 0400  piperacillin-tazobactam (ZOSYN) IVPB 3.375 g        3.375 g 12.5 mL/hr over 240 Minutes Intravenous  Once 12/10/20 0350 12/10/20 0450     Culture/Microbiology    Component Value Date/Time   SDES BLOOD LEFT HAND 01/03/2021 1131   SDES BLOOD LEFT FOREARM 01/03/2021 1131   SPECREQUEST  01/03/2021 1131    BOTTLES DRAWN AEROBIC ONLY Blood Culture adequate volume   SPECREQUEST  01/03/2021 1131    BOTTLES DRAWN AEROBIC ONLY Blood Culture adequate volume   CULT  01/03/2021 1131    NO GROWTH < 24 HOURS Performed at [facility], [address], [place], [place] [address]    Elpidio Anis NEGATIVE RODS 01/03/2021 1131   REPTSTATUS PENDING 01/03/2021 1131   REPTSTATUS PENDING 01/03/2021 1131    Other culture-see note  Objective: Vitals: Today's Vitals   01/04/21 0000 01/04/21 0519 01/04/21 0609 01/04/21 0814  BP: 137/81 (!) 145/79  (!) 143/78  Pulse: (!) 111 (!) 118    Resp: 20 20    Temp: 98.9 F (37.2 C) (!) 100.4 F (38 C)  98.4 F (36.9 C)  TempSrc: Oral Axillary  Oral  SpO2: 99% 98%    Weight:      Height:      PainSc:   Asleep     Intake/Output Summary (Last 24 hours) at 01/04/2021 1497 Last data  filed at 01/04/2021 0200 Gross per 24 hour  Intake 188.78 ml  Output 2575 ml  Net -2386.22 ml   Filed Weights   12/28/20 0345 12/29/20 0400 01/03/21 0500  Weight: 127.8 kg 126.7 kg 120.6 kg   Weight change:   Intake/Output from previous day: 04/24 0701 - 04/25 0700 In: 188.8 [I.V.:188.8] Out: 2575 [Urine:2575] Intake/Output this shift: No intake/output data recorded. Filed Weights   12/28/20 0345 12/29/20 0400 01/03/21 0500  Weight: 127.8 kg 126.7 kg 120.6 kg    Examination: General exam: AAO,  whispering, follows command, on room air HEENT:Oral mucosa moist, Ear/Nose WNL grossly, dentition normal. Respiratory system: bilaterally clear,no wheezing or crackles,no use of accessory muscle Cardiovascular system: S1 & S2 +, No JVD,. Gastrointestinal system: Abdomen soft, NT,ND, BS+ core track present Nervous System:Alert, awake, moving RUE, LE well, weak LUE Extremities: No edema, distal peripheral pulses palpable.  Skin: No rashes,no icterus. MSK: Normal muscle bulk,tone, power  Data Reviewed: I have personally reviewed following labs and imaging studies CBC: Recent Labs  Lab 12/31/20 0525 01/01/21 0230 01/02/21 0020 01/03/21 0243 01/04/21 0247  WBC 10.2  10.1 9.9 9.3 10.8* 11.7*  NEUTROABS 7.6  --   --   --   --   HGB 9.7*  9.5* 8.9* 8.9* 10.0* 9.5*  HCT 31.9*  32.1* 28.5* 28.6* 31.6* 29.9*  MCV 104.2*  104.2* 101.4* 101.1* 99.7 98.7  PLT 216  208 180 173 175 190   Basic Metabolic Panel: Recent Labs  Lab 12/28/20 1129 12/28/20 1655 12/29/20 0412 12/29/20 0939 12/30/20 0408 12/30/20 1746 12/31/20 0525 01/01/21 0230 01/02/21 0020 01/03/21 0243 01/04/21 0247  NA  --    < > 158*   < > 153*   < > 148* 146* 145 143 144  K  --    < > 3.5   < > 3.2*   < > 3.9 4.0 4.0 4.1 3.4*  CL  --    < > 123*   < > 119*   < > 117* 111 111 108 108  CO2  --    < > 28   < > 26   < > 26 28 28 27 27   GLUCOSE  --    < > 176*   < > 191*   < > 185* 188* 166* 195* 106*  BUN  --    < > 76*   < > 59*   < > 49* 52* 55* 54* 57*  CREATININE  --    < > 1.50*   < > 1.18   < > 1.06 1.16 1.20 1.05 0.99  CALCIUM  --    < > 8.5*   < > 8.1*   < > 8.8* 8.9 8.8* 8.9 8.7*  MG 2.8*  --  2.7*  --  2.5*  --  2.2  --  2.2  --   --   PHOS  --   --  2.6  --  2.5  --  3.5  --   --   --   --    < > = values in this interval not displayed.   GFR: Estimated Creatinine Clearance: 112.3 mL/min (by C-G formula based on SCr of 0.99 mg/dL). Liver Function Tests: Recent Labs  Lab 12/31/20 1151 01/02/21 0020   AST 21 22  ALT 16 14  ALKPHOS 58 62  BILITOT 0.5 0.5  PROT 7.4 7.2  ALBUMIN 2.3* 2.2*   No results for input(s): LIPASE, AMYLASE in the  last 168 hours. Recent Labs  Lab 12/28/20 1129  AMMONIA 26   Coagulation Profile: No results for input(s): INR, PROTIME in the last 168 hours. Cardiac Enzymes: No results for input(s): CKTOTAL, CKMB, CKMBINDEX, TROPONINI in the last 168 hours. BNP (last 3 results) No results for input(s): PROBNP in the last 8760 hours. HbA1C: No results for input(s): HGBA1C in the last 72 hours. CBG: Recent Labs  Lab 01/03/21 2031 01/03/21 2259 01/04/21 0313 01/04/21 0518 01/04/21 0812  GLUCAP 158* 174* 98 127* 162*   Lipid Profile: No results for input(s): CHOL, HDL, LDLCALC, TRIG, CHOLHDL, LDLDIRECT in the last 72 hours. Thyroid Function Tests: No results for input(s): TSH, T4TOTAL, FREET4, T3FREE, THYROIDAB in the last 72 hours. Anemia Panel: No results for input(s): VITAMINB12, FOLATE, FERRITIN, TIBC, IRON, RETICCTPCT in the last 72 hours. Sepsis Labs: No results for input(s): PROCALCITON, LATICACIDVEN in the last 168 hours.  Recent Results (from the past 240 hour(s))  Culture, blood (routine x 2)     Status: None   Collection Time: 12/26/20  5:43 AM   Specimen: BLOOD  Result Value Ref Range Status   Specimen Description BLOOD RIGHT ANTECUBITAL  Final   Special Requests   Final    BOTTLES DRAWN AEROBIC AND ANAEROBIC Blood Culture adequate volume   Culture   Final    NO GROWTH 5 DAYS Performed at [facility], [address] [place], [place] [address]    Report Status 12/31/2020 FINAL  Final  Culture, blood (routine x 2)     Status: None   Collection Time: 12/26/20  5:43 AM   Specimen: BLOOD RIGHT HAND  Result Value Ref Range Status   Specimen Description BLOOD RIGHT HAND  Final   Special Requests   Final    BOTTLES DRAWN AEROBIC ONLY Blood Culture results may not be optimal due to an inadequate volume of blood received in  culture bottles   Culture   Final    NO GROWTH 5 DAYS Performed at [facility], [address], [place], [place] [address]    Report Status 12/31/2020 FINAL  Final  Culture, Urine     Status: None   Collection Time: 01/02/21  4:06 AM   Specimen: Urine, Random  Result Value Ref Range Status   Specimen Description URINE, RANDOM  Final   Special Requests NONE  Final   Culture   Final    NO GROWTH Performed at [facility], [address] [place], [place] [address]    Report Status 01/03/2021 FINAL  Final  Culture, blood (routine x 2)     Status: None (Preliminary result)   Collection Time: 01/02/21  4:19 AM   Specimen: BLOOD LEFT HAND  Result Value Ref Range Status   Specimen Description BLOOD LEFT HAND  Final   Special Requests AEROBIC BOTTLE ONLY Blood Culture adequate volume  Final   Culture  Setup Time   Final    GRAM POSITIVE COCCI IN CLUSTERS AEROBIC BOTTLE ONLY CRITICAL VALUE NOTED.  VALUE IS CONSISTENT WITH PREVIOUSLY REPORTED AND CALLED VALUE. Performed at [facility], [address] [place], [place] [address]    Culture GRAM POSITIVE COCCI  Final   Report Status PENDING  Incomplete  Culture, blood (routine x 2)     Status: None (Preliminary result)   Collection Time: 01/02/21  4:32 AM   Specimen: BLOOD LEFT HAND  Result Value Ref Range Status   Specimen Description BLOOD LEFT HAND  Final   Special Requests   Final    BOTTLES DRAWN AEROBIC AND ANAEROBIC Blood Culture results may not be optimal due to an inadequate volume of blood received in culture bottles   Culture  Setup Time   Final    GRAM POSITIVE COCCI ANAEROBIC BOTTLE ONLY CRITICAL RESULT CALLED TO, READ BACK BY AND VERIFIED WITH: PHARMD GREG ABBOTT 01/03/2021 AT 0630 A.HUGHES Performed at [facility], [address] [place], [place] [address]    Culture GRAM POSITIVE COCCI  Final   Report Status PENDING  Incomplete  Blood Culture ID Panel (Reflexed)     Status: Abnormal    Collection Time: 01/02/21  4:32 AM  Result Value Ref Range Status   Enterococcus faecalis NOT DETECTED NOT DETECTED Final   Enterococcus Faecium NOT DETECTED NOT DETECTED Final   Listeria monocytogenes NOT DETECTED NOT DETECTED Final   Staphylococcus species DETECTED (A) NOT DETECTED Final    Comment: CRITICAL RESULT CALLED TO, READ BACK BY AND VERIFIED WITH: PHARMD GREG ABBOTT 01/03/2021 AT 0630 A.HUGHES    Staphylococcus aureus (BCID) NOT DETECTED NOT DETECTED Final   Staphylococcus epidermidis DETECTED (A) NOT DETECTED Final    Comment: Methicillin (oxacillin) resistant coagulase negative staphylococcus. Possible blood culture contaminant (unless isolated from more than one blood culture draw or clinical case suggests pathogenicity). No antibiotic treatment is indicated for blood  culture contaminants. CRITICAL RESULT CALLED TO, READ BACK BY AND VERIFIED WITH: PHARMD GREG ABBOTT 01/03/2021 AT 0630 A.HUGHES    Staphylococcus lugdunensis NOT DETECTED NOT DETECTED Final   Streptococcus species NOT DETECTED NOT DETECTED Final   Streptococcus agalactiae NOT DETECTED NOT DETECTED Final   Streptococcus pneumoniae NOT DETECTED NOT DETECTED Final   Streptococcus pyogenes NOT DETECTED NOT DETECTED Final   A.calcoaceticus-baumannii NOT DETECTED NOT DETECTED Final   Bacteroides fragilis NOT DETECTED NOT DETECTED Final   Enterobacterales NOT DETECTED NOT DETECTED Final   Enterobacter cloacae complex NOT DETECTED NOT DETECTED Final   Escherichia coli NOT DETECTED NOT DETECTED Final   Klebsiella aerogenes NOT DETECTED NOT DETECTED Final   Klebsiella oxytoca NOT DETECTED NOT DETECTED Final   Klebsiella pneumoniae NOT DETECTED NOT DETECTED Final   Proteus species NOT DETECTED NOT DETECTED Final   Salmonella species NOT DETECTED NOT DETECTED Final   Serratia marcescens NOT DETECTED NOT DETECTED Final   Haemophilus influenzae NOT DETECTED NOT DETECTED Final   Neisseria meningitidis NOT DETECTED NOT  DETECTED Final   Pseudomonas aeruginosa NOT DETECTED NOT DETECTED Final   Stenotrophomonas maltophilia NOT DETECTED NOT DETECTED Final   Candida albicans NOT DETECTED NOT DETECTED Final   Candida auris NOT DETECTED NOT DETECTED Final   Candida glabrata NOT DETECTED NOT DETECTED Final   Candida krusei NOT DETECTED NOT DETECTED Final   Candida parapsilosis NOT DETECTED NOT DETECTED Final   Candida tropicalis NOT DETECTED NOT DETECTED Final   Cryptococcus neoformans/gattii NOT DETECTED NOT DETECTED Final   Methicillin resistance mecA/C DETECTED (A) NOT DETECTED Final    Comment: CRITICAL RESULT CALLED TO, READ BACK BY AND VERIFIED WITH: PHARMD GREG ABBOTT 01/03/2021 AT 0630 A.HUGHES Performed at [facility], [address] [place], [place] [address]   Culture, blood (Routine X 2) w Reflex to ID Panel     Status: None (Preliminary result)   Collection Time: 01/03/21 11:31 AM   Specimen: BLOOD LEFT HAND  Result Value Ref Range Status   Specimen Description BLOOD LEFT HAND  Final   Special Requests   Final  BOTTLES DRAWN AEROBIC ONLY Blood Culture adequate volume   Culture  Setup Time   Final    GRAM NEGATIVE RODS AEROBIC BOTTLE ONLY PHARMD GREG ABBOTT 01/04/2021 AT 0425 A.HUGHES CRITICAL VALUE NOTED.  VALUE IS CONSISTENT WITH PREVIOUSLY REPORTED AND CALLED VALUE.    Culture   Final    NO GROWTH < 24 HOURS Performed at [facility], [address] [place], [place] [address]    Report Status PENDING  Incomplete  Culture, blood (Routine X 2) w Reflex to ID Panel     Status: None (Preliminary result)   Collection Time: 01/03/21 11:31 AM   Specimen: BLOOD LEFT FOREARM  Result Value Ref Range Status   Specimen Description BLOOD LEFT FOREARM  Final   Special Requests   Final    BOTTLES DRAWN AEROBIC ONLY Blood Culture adequate volume   Culture  Setup Time   Final    GRAM NEGATIVE RODS AEROBIC BOTTLE ONLY CRITICAL RESULT CALLED TO, READ BACK BY AND VERIFIED  WITH: PHARMD GREG ABBOTT 01/04/2021 AT 0425 A.HUGHES Performed at [facility], [address], [place], [place] [address]    Culture GRAM NEGATIVE RODS  Final   Report Status PENDING  Incomplete  Blood Culture ID Panel (Reflexed)     Status: Abnormal   Collection Time: 01/03/21 11:31 AM  Result Value Ref Range Status   Enterococcus faecalis NOT DETECTED NOT DETECTED Final   Enterococcus Faecium NOT DETECTED NOT DETECTED Final   Listeria monocytogenes NOT DETECTED NOT DETECTED Final   Staphylococcus species NOT DETECTED NOT DETECTED Final   Staphylococcus aureus (BCID) NOT DETECTED NOT DETECTED Final   Staphylococcus epidermidis NOT DETECTED NOT DETECTED Final   Staphylococcus lugdunensis NOT DETECTED NOT DETECTED Final   Streptococcus species NOT DETECTED NOT DETECTED Final   Streptococcus agalactiae NOT DETECTED NOT DETECTED Final   Streptococcus pneumoniae NOT DETECTED NOT DETECTED Final   Streptococcus pyogenes NOT DETECTED NOT DETECTED Final   A.calcoaceticus-baumannii NOT DETECTED NOT DETECTED Final   Bacteroides fragilis NOT DETECTED NOT DETECTED Final   Enterobacterales DETECTED (A) NOT DETECTED Final    Comment: Enterobacterales represent a large order of gram negative bacteria, not a single organism. CRITICAL RESULT CALLED TO, READ BACK BY AND VERIFIED WITH: PHARMD GREG ABBOTT 01/04/2021 AT 0425 A.HUGHES    Enterobacter cloacae complex NOT DETECTED NOT DETECTED Final   Escherichia coli DETECTED (A) NOT DETECTED Final    Comment: CRITICAL RESULT CALLED TO, READ BACK BY AND VERIFIED WITH: PHARMD GREG ABBOTT 01/04/2021 AT 0425 A.HUGHES    Klebsiella aerogenes NOT DETECTED NOT DETECTED Final   Klebsiella oxytoca NOT DETECTED NOT DETECTED Final   Klebsiella pneumoniae NOT DETECTED NOT DETECTED Final   Proteus species NOT DETECTED NOT DETECTED Final   Salmonella species NOT DETECTED NOT DETECTED Final   Serratia marcescens NOT DETECTED NOT DETECTED Final   Haemophilus  influenzae NOT DETECTED NOT DETECTED Final   Neisseria meningitidis NOT DETECTED NOT DETECTED Final   Pseudomonas aeruginosa NOT DETECTED NOT DETECTED Final   Stenotrophomonas maltophilia NOT DETECTED NOT DETECTED Final   Candida albicans NOT DETECTED NOT DETECTED Final   Candida auris NOT DETECTED NOT DETECTED Final   Candida glabrata NOT DETECTED NOT DETECTED Final   Candida krusei NOT DETECTED NOT DETECTED Final   Candida parapsilosis NOT DETECTED NOT DETECTED Final   Candida tropicalis NOT DETECTED NOT DETECTED Final   Cryptococcus neoformans/gattii NOT DETECTED NOT DETECTED Final   CTX-M ESBL NOT DETECTED NOT DETECTED  Final   Carbapenem resistance IMP NOT DETECTED NOT DETECTED Final   Carbapenem resistance KPC NOT DETECTED NOT DETECTED Final   Carbapenem resistance NDM NOT DETECTED NOT DETECTED Final   Carbapenem resist OXA 48 LIKE NOT DETECTED NOT DETECTED Final   Carbapenem resistance VIM NOT DETECTED NOT DETECTED Final    Comment: Performed at [facility], [address], [place], [place] [address]     Radiology Studies: DG Chest Port 1 View  Result Date: 01/03/2021 CLINICAL DATA:  Respiratory failure. EXAM: PORTABLE CHEST 1 VIEW COMPARISON:  01/01/2021 FINDINGS: Right arm PICC line tip is in the cavoatrial junction. Feeding tube tip is well below the GE junction. Stable cardiomediastinal contours. Platelike atelectasis in the left lower lung is unchanged. Pulmonary vascular congestion appears unchanged from previous exam. Mild patchy airspace densities within the right midlung appear similar to previous exam. IMPRESSION: 1. No change in aeration to the lungs compared with previous exam. 2. Stable support apparatus. Electronically Signed   By: Signa Kell M.D.   On: 01/03/2021 07:24     LOS: 25 days   Lanae Boast, MD [facility]  01/04/2021, 8:33 AM

## 2021-01-04 NOTE — Progress Notes (Signed)
PHARMACY - PHYSICIAN COMMUNICATION CRITICAL VALUE ALERT - BLOOD CULTURE IDENTIFICATION (BCID)  [name] is an 60 y.o. male with MRSE bacteremia on Vancomycin, h/o E. Coli bacteremia 11/12/20   Assessment:   Blood cultures drawn 4/24 growing E. coli  Name of physician (or Provider) Contacted:  Dr. Leafy Half  Current antibiotics: Vancomycin   Changes to prescribed antibiotics recommended:  Add Rocephin 2 g IV q24h  Results for orders placed or performed during the hospital encounter of 12/10/20  Blood Culture ID Panel (Reflexed) (Collected: 01/03/2021 11:31 AM)  Result Value Ref Range   Enterococcus faecalis NOT DETECTED NOT DETECTED   Enterococcus Faecium NOT DETECTED NOT DETECTED   Listeria monocytogenes NOT DETECTED NOT DETECTED   Staphylococcus species NOT DETECTED NOT DETECTED   Staphylococcus aureus (BCID) NOT DETECTED NOT DETECTED   Staphylococcus epidermidis NOT DETECTED NOT DETECTED   Staphylococcus lugdunensis NOT DETECTED NOT DETECTED   Streptococcus species NOT DETECTED NOT DETECTED   Streptococcus agalactiae NOT DETECTED NOT DETECTED   Streptococcus pneumoniae NOT DETECTED NOT DETECTED   Streptococcus pyogenes NOT DETECTED NOT DETECTED   A.calcoaceticus-baumannii NOT DETECTED NOT DETECTED   Bacteroides fragilis NOT DETECTED NOT DETECTED   Enterobacterales DETECTED (A) NOT DETECTED   Enterobacter cloacae complex NOT DETECTED NOT DETECTED   Escherichia coli DETECTED (A) NOT DETECTED   Klebsiella aerogenes NOT DETECTED NOT DETECTED   Klebsiella oxytoca NOT DETECTED NOT DETECTED   Klebsiella pneumoniae NOT DETECTED NOT DETECTED   Proteus species NOT DETECTED NOT DETECTED   Salmonella species NOT DETECTED NOT DETECTED   Serratia marcescens NOT DETECTED NOT DETECTED   Haemophilus influenzae NOT DETECTED NOT DETECTED   Neisseria meningitidis NOT DETECTED NOT DETECTED   Pseudomonas aeruginosa NOT DETECTED NOT DETECTED   Stenotrophomonas maltophilia NOT DETECTED NOT  DETECTED   Candida albicans NOT DETECTED NOT DETECTED   Candida auris NOT DETECTED NOT DETECTED   Candida glabrata NOT DETECTED NOT DETECTED   Candida krusei NOT DETECTED NOT DETECTED   Candida parapsilosis NOT DETECTED NOT DETECTED   Candida tropicalis NOT DETECTED NOT DETECTED   Cryptococcus neoformans/gattii NOT DETECTED NOT DETECTED   CTX-M ESBL NOT DETECTED NOT DETECTED   Carbapenem resistance IMP NOT DETECTED NOT DETECTED   Carbapenem resistance KPC NOT DETECTED NOT DETECTED   Carbapenem resistance NDM NOT DETECTED NOT DETECTED   Carbapenem resist OXA 48 LIKE NOT DETECTED NOT DETECTED   Carbapenem resistance VIM NOT DETECTED NOT DETECTED    Eddie Candle 01/04/2021  4:29 AM

## 2021-01-04 NOTE — Plan of Care (Signed)
  Problem: Education: Goal: Knowledge of General Education information will improve Description: Including pain rating scale, medication(s)/side effects and non-pharmacologic comfort measures Outcome: Progressing   Problem: Health Behavior/Discharge Planning: Goal: Ability to manage health-related needs will improve Outcome: Progressing   Problem: Clinical Measurements: Goal: Ability to maintain clinical measurements within normal limits will improve Outcome: Progressing Goal: Will remain free from infection Outcome: Progressing Goal: Diagnostic test results will improve Outcome: Progressing Goal: Respiratory complications will improve Outcome: Progressing Goal: Cardiovascular complication will be avoided Outcome: Progressing   Problem: Activity: Goal: Risk for activity intolerance will decrease Outcome: Progressing   Problem: Nutrition: Goal: Adequate nutrition will be maintained Outcome: Progressing   Problem: Coping: Goal: Level of anxiety will decrease Outcome: Progressing   Problem: Elimination: Goal: Will not experience complications related to bowel motility Outcome: Progressing Goal: Will not experience complications related to urinary retention Outcome: Progressing   Problem: Pain Managment: Goal: General experience of comfort will improve Outcome: Progressing   Problem: Safety: Goal: Ability to remain free from injury will improve Outcome: Progressing   Problem: Skin Integrity: Goal: Risk for impaired skin integrity will decrease Outcome: Progressing   Problem: Safety: Goal: Non-violent Restraint(s) Outcome: Progressing   Problem: Education: Goal: Knowledge of disease or condition will improve Outcome: Progressing Goal: Knowledge of secondary prevention will improve Outcome: Progressing Goal: Knowledge of patient specific risk factors addressed and post discharge goals established will improve Outcome: Progressing   Problem: Self-Care: Goal:  Verbalization of feelings and concerns over difficulty with self-care will improve Outcome: Progressing   Problem: Nutrition: Goal: Dietary intake will improve Outcome: Progressing   

## 2021-01-04 NOTE — Progress Notes (Signed)
Reviewed patient's chart. Appears to be improving over he weekend from respiratory perspective. Has been weaned to room air. Please call back PCCM if we can be of further assistance. Will see as needed.  Durel Salts, MD Pulmonary and Critical Care Medicine [facility]

## 2021-01-04 NOTE — Progress Notes (Addendum)
Physical Therapy Wound Treatment Patient Details  Name: [name] MRN: [id number] Date of Birth: [birth year]  Today's Date: 01/04/2021 Time: 7619-5093 Time Calculation (min): 32 min  Subjective  Subjective Assessment Subjective: Restless and appears painful at times. R wrist restraint Patient and Family Stated Goals: None stated Date of Onset:  (Unknown) Prior Treatments:  (Dressing changes)  Pain Score:  Premedicated however still appeared painful at times.   Wound Assessment  Pressure Injury 12/21/20 Buttocks Right Deep Tissue Pressure Injury - Purple or maroon localized area of discolored intact skin or blood-filled blister due to damage of underlying soft tissue from pressure and/or shear. purple with blister area that has  (Active)  Wound Image   01/04/21 1236  Dressing Type ABD;Barrier Film (skin prep);Gauze (Comment);Moist to dry 01/04/21 1236  Dressing Changed;Clean;Dry;Intact 01/04/21 1236  Dressing Change Frequency Daily (Hydrotherapy MWF) 01/04/21 1236  State of Healing Non-healing 01/04/21 1236  Site / Wound Assessment Pink;Yellow;Brown;Black 01/04/21 1236  % Wound base Red or Granulating 25% 01/04/21 1236  % Wound base Yellow/Fibrinous Exudate 65% 01/04/21 1236  % Wound base Black/Eschar 10% 01/04/21 1236  % Wound base Other/Granulation Tissue (Comment) 0% 01/04/21 1236  Peri-wound Assessment Erythema (blanchable);Intact 01/04/21 1236  Wound Length (cm) 11 cm 01/01/21 1000  Wound Width (cm) 6.5 cm 01/01/21 1000  Wound Depth (cm) 2.5 cm 01/01/21 1000  Wound Surface Area (cm^2) 71.5 cm^2 01/01/21 1000  Wound Volume (cm^3) 178.75 cm^3 01/01/21 1000  Tunneling (cm) 0 01/01/21 1000  Undermining (cm) 0 01/01/21 1000  Margins Unattached edges (unapproximated) 01/04/21 1236  Drainage Amount Moderate 01/04/21 1236  Drainage Description Serosanguineous 01/04/21 1236  Treatment Debridement (Selective);Hydrotherapy (Pulse lavage);Packing (Saline gauze) 01/04/21 1236       Hydrotherapy Pulsed lavage therapy - wound location: R buttock Pulsed Lavage with Suction (psi): 12 psi Pulsed Lavage with Suction - Normal Saline Used: 1000 mL Pulsed Lavage Tip: Tip with splash shield Selective Debridement Selective Debridement - Location: R buttock Selective Debridement - Tools Used: Forceps,Scalpel Selective Debridement - Tissue Removed: eschar, yellow slough    Wound Assessment and Plan  Wound Therapy - Assess/Plan/Recommendations Wound Therapy - Clinical Statement: Pt with continued restlessness however he did settle down once on his side and we were able to attain the most optimal positioning to date for treatment. Most of the tissue at the wound bed continues to appear to be adipose (a mixture of non-viable and viable). I again asked for an air mattress to be ordered for this patient as he has several areas of skin breakdown already. This pt wil benefit from continued hydrotherapy for selective debridement of unviable tissue, to decrease bioburden and promote wound bed healing. Wound Therapy - Functional Problem List: Global weakness in the setting of critical illness and extended ICU stay. Factors Delaying/Impairing Wound Healing: Immobility,Multiple medical problems,Polypharmacy,Infection - systemic/local Hydrotherapy Plan: Dressing change,Patient/family education,Pulsatile lavage with suction,Debridement Wound Therapy - Frequency: 3X / week (hydrotherapy MWF with nursing to continue with daily dressing changes in between.) Wound Therapy - Follow Up Recommendations: dressing changes by RN  Wound Therapy Goals- Improve the function of patient's integumentary system by progressing the wound(s) through the phases of wound healing (inflammation - proliferation - remodeling) by: Wound Therapy Goals - Improve the function of patient's integumentary system by progressing the wound(s) through the phases of wound healing by: Decrease Necrotic Tissue to: 25% Decrease  Necrotic Tissue - Progress: Progressing toward goal Increase Granulation Tissue to: 75% Increase Granulation Tissue - Progress: Progressing toward goal Improve Drainage  Characteristics: Min,Serous Improve Drainage Characteristics - Progress: Progressing toward goal Goals/treatment plan/discharge plan were made with and agreed upon by patient/family: No, Patient unable to participate in goals/treatment/discharge plan and family unavailable Time For Goal Achievement: 7 days Wound Therapy - Potential for Goals: Good  Goals will be updated until maximal potential achieved or discharge criteria met.  Discharge criteria: when goals achieved, discharge from hospital, MD decision/surgical intervention, no progress towards goals, refusal/missing three consecutive treatments without notification or medical reason.  GP     Charges PT Wound Care Charges $Wound Debridement up to 20 cm: < or equal to 20 cm $ Wound Debridement each add'l 20 sqcm: 2 $PT PLS Gun and Tip: 1 Supply $PT Hydrotherapy Visit: 1 Visit       Thelma Comp 01/04/2021, 12:46 PM   Rolinda Roan, PT, DPT Acute Rehabilitation Services Pager: [phone] Office: [phone]

## 2021-01-04 NOTE — Progress Notes (Incomplete)
  Echocardiogram 2D Echocardiogram has been performed.  [name] 01/04/2021, 2:58 PM

## 2021-01-04 NOTE — Progress Notes (Signed)
[facility] Infectious Diseases Follow Up Note  Patient Identification: Patient Name: [name] MRN: [id number] [place] Date: 12/10/2020  3:36 AM Age: 60 y.o.Today's Date: 01/04/2021   Reason for Visit: Follow up on MRSE bacteremia   Active Problems:   Acute respiratory failure ([place])   AKI (acute kidney injury) ([place])   Bacteremia due to Escherichia coli   Ureteral stone   Pyelonephritis   Cerebral thrombosis with cerebral infarction   Staphylococcus epidermidis bacteremia   Antibiotics: Ceftriaxone 3/31-4/3, 4/7-4/11, 4/14-4/15, ceftriaxone 4/24-                     Vancomycin 4/14-   Lines/Tubes: PIV's, urethral catheter, NG tube  Interval Events: Continues to be febrile but fever peak is downtrending, mild leukocytosis of 11.7   Assessment 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 This is possibly related to increased clearance of Vancomycin with improving renal function. Will dose adjust with Vanc trough aim of 15-20  Recommendations Continue vancomycin, pharmacy adjusting vancomycin dosing based on renal function.  Continue ceftriaxone Follow-up TTE, upper and lower extremity Dopplers. Will consider getting a TEE if TTE is negative given multiple positive blood cultures with MRSE  Vascular surgery consultation for Left IJ and subclavian DVT  Will also probably need to image  abdomen given recurrent E. coli bacteremia Will check Hep B and C serologies with am labs given cirrhosis Following  Rest of the management as per the primary team. Thank you for the consult. Please page with pertinent questions or concerns.  ______________________________________________________________________ Subjective patient seen and examined at the bedside.  He is not able to speak in full sentences.  Did not complain abdominal pain to me but nurse said complained of abdominal pain on and off.  Denied nausea vomiting and diarrhea  Vitals BP (!) 143/78 (BP Location: Left Leg)   Pulse (!) 112   Temp 98.4 F (36.9 C) (Oral)   Resp 20   Ht _0  (1.88 m)   Wt 120.6 kg   SpO2 98%   BMI 34.14 kg/m     Physical Exam Constitutional:   Lying in bed, has NG tube, on oxygen    Comments:   Cardiovascular:     Rate and Rhythm: Normal rate and regular rhythm.     Heart sounds: Tachycardic  Pulmonary:     Effort: Moderate respiratory distress    Comments:   Abdominal:     Palpations: Abdomen is soft.     Tenderness: Nontender  Musculoskeletal:        General: No swelling or tenderness.   Skin:    Comments: No obvious rashes  Neurological:     General: No focal deficit present.   Psychiatric:        Mood and Affect: Unable to assess   Pertinent Microbiology Results for orders placed or performed during the hospital encounter of 12/10/20  Culture, blood (routine x 2)     Status: Abnormal   Collection Time: 12/10/20  3:50 AM  Specimen: BLOOD  Result Value Ref Range Status   Specimen Description BLOOD LEFT ANTECUBITAL  Final   Special Requests   Final    BOTTLES DRAWN AEROBIC AND ANAEROBIC Blood Culture adequate volume   Culture  Setup Time   Final    GRAM NEGATIVE RODS IN BOTH AEROBIC AND ANAEROBIC BOTTLES CRITICAL RESULT CALLED TO, READ BACK BY AND VERIFIED WITHJeanette Caprice [facility] [id number] 12/10/20 A BROWNING Performed at [facility], [place] [address],  [place], [place] [address]    Culture ESCHERICHIA COLI (A)  Final   Report Status 12/12/2020 FINAL  Final   Organism ID, Bacteria ESCHERICHIA COLI  Final      Susceptibility   Escherichia coli - MIC*    AMPICILLIN 8 SENSITIVE Sensitive     CEFAZOLIN <=4 SENSITIVE Sensitive     CEFEPIME <=0.12 SENSITIVE Sensitive     CEFTAZIDIME <=1 SENSITIVE Sensitive     CEFTRIAXONE <=0.25 SENSITIVE Sensitive     CIPROFLOXACIN <=0.25 SENSITIVE Sensitive     GENTAMICIN <=1 SENSITIVE Sensitive     IMIPENEM <=0.25 SENSITIVE Sensitive     TRIMETH/SULFA <=20 SENSITIVE Sensitive     AMPICILLIN/SULBACTAM <=2 SENSITIVE Sensitive     PIP/TAZO <=4 SENSITIVE Sensitive     * ESCHERICHIA COLI  Blood Culture ID Panel (Reflexed)     Status: Abnormal   Collection Time: 12/10/20  3:50 AM  Result Value Ref Range Status   Enterococcus faecalis NOT DETECTED NOT DETECTED Final   Enterococcus Faecium NOT DETECTED NOT DETECTED Final   Listeria monocytogenes NOT DETECTED NOT DETECTED Final   Staphylococcus species NOT DETECTED NOT DETECTED Final   Staphylococcus aureus (BCID) NOT DETECTED NOT DETECTED Final   Staphylococcus epidermidis NOT DETECTED NOT DETECTED Final   Staphylococcus lugdunensis NOT DETECTED NOT DETECTED Final   Streptococcus species NOT DETECTED NOT DETECTED Final   Streptococcus agalactiae NOT DETECTED NOT DETECTED Final   Streptococcus pneumoniae NOT DETECTED NOT DETECTED Final   Streptococcus pyogenes NOT DETECTED NOT DETECTED Final   A.calcoaceticus-baumannii NOT DETECTED NOT DETECTED Final   Bacteroides fragilis NOT DETECTED NOT DETECTED Final   Enterobacterales DETECTED (A) NOT DETECTED Final    Comment: Enterobacterales represent a large order of gram negative bacteria, not a single organism. CRITICAL RESULT CALLED TO, READ BACK BY AND VERIFIED WITH: Jeanette Caprice PHARMD [id number] 12/10/20 A BROWNING    Enterobacter cloacae complex NOT DETECTED NOT DETECTED Final   Escherichia coli DETECTED (A) NOT DETECTED  Final    Comment: CRITICAL RESULT CALLED TO, READ BACK BY AND VERIFIED WITH: Jeanette Caprice PHARMD [id number] 12/10/20 A BROWNING    Klebsiella aerogenes NOT DETECTED NOT DETECTED Final   Klebsiella oxytoca NOT DETECTED NOT DETECTED Final   Klebsiella pneumoniae NOT DETECTED NOT DETECTED Final   Proteus species NOT DETECTED NOT DETECTED Final   Salmonella species NOT DETECTED NOT DETECTED Final   Serratia marcescens NOT DETECTED NOT DETECTED Final   Haemophilus influenzae NOT DETECTED NOT DETECTED Final   Neisseria meningitidis NOT DETECTED NOT DETECTED Final   Pseudomonas aeruginosa NOT DETECTED NOT DETECTED Final   Stenotrophomonas maltophilia NOT DETECTED NOT DETECTED Final   Candida albicans NOT DETECTED NOT DETECTED Final   Candida auris NOT DETECTED NOT DETECTED Final   Candida glabrata NOT DETECTED NOT DETECTED Final   Candida krusei NOT DETECTED NOT DETECTED Final   Candida parapsilosis NOT DETECTED NOT DETECTED Final   Candida tropicalis NOT DETECTED NOT DETECTED Final   Cryptococcus neoformans/gattii NOT DETECTED NOT DETECTED  Final   CTX-M ESBL NOT DETECTED NOT DETECTED Final   Carbapenem resistance IMP NOT DETECTED NOT DETECTED Final   Carbapenem resistance KPC NOT DETECTED NOT DETECTED Final   Carbapenem resistance NDM NOT DETECTED NOT DETECTED Final   Carbapenem resist OXA 48 LIKE NOT DETECTED NOT DETECTED Final   Carbapenem resistance VIM NOT DETECTED NOT DETECTED Final    Comment: Performed at [facility], [place] [address], [place], [place] [address]  Resp Panel by RT-PCR (Flu A&B, Covid) Nasopharyngeal Swab     Status: None   Collection Time: 12/10/20  3:59 AM   Specimen: Nasopharyngeal Swab; Nasopharyngeal(NP) swabs in vial transport medium  Result Value Ref Range Status   SARS Coronavirus 2 by RT PCR NEGATIVE NEGATIVE Final    Comment: (NOTE) SARS-CoV-2 target nucleic acids are NOT DETECTED.  The SARS-CoV-2 RNA is generally detectable in upper respiratory specimens  during the acute phase of infection. The lowest concentration of SARS-CoV-2 viral copies this assay can detect is 138 copies/mL. A negative result does not preclude SARS-Cov-2 infection and should not be used as the sole basis for treatment or other patient management decisions. A negative result may occur with  improper specimen collection/handling, submission of specimen other than nasopharyngeal swab, presence of viral mutation(s) within the areas targeted by this assay, and inadequate number of viral copies(<138 copies/mL). A negative result must be combined with clinical observations, patient history, and epidemiological information. The expected result is Negative.  Fact Sheet for Patients:  EntrepreneurPulse.com.au  Fact Sheet for Healthcare Providers:  IncredibleEmployment.be  This test is no t yet approved or cleared by the [place] FDA and  has been authorized for detection and/or diagnosis of SARS-CoV-2 by FDA under an Emergency Use Authorization (EUA). This EUA will remain  in effect (meaning this test can be used) for the duration of the COVID-19 declaration under Section 564(b)(1) of the Act, 21 U.S.C.section 360bbb-3(b)(1), unless the authorization is terminated  or revoked sooner.       Influenza A by PCR NEGATIVE NEGATIVE Final   Influenza B by PCR NEGATIVE NEGATIVE Final    Comment: (NOTE) The Xpert Xpress SARS-CoV-2/FLU/RSV plus assay is intended as an aid in the diagnosis of influenza from Nasopharyngeal swab specimens and should not be used as a sole basis for treatment. Nasal washings and aspirates are unacceptable for Xpert Xpress SARS-CoV-2/FLU/RSV testing.  Fact Sheet for Patients: EntrepreneurPulse.com.au  Fact Sheet for Healthcare Providers: IncredibleEmployment.be  This test is not yet approved or cleared by the [place] FDA and has been authorized for detection  and/or diagnosis of SARS-CoV-2 by FDA under an Emergency Use Authorization (EUA). This EUA will remain in effect (meaning this test can be used) for the duration of the COVID-19 declaration under Section 564(b)(1) of the Act, 21 U.S.C. section 360bbb-3(b)(1), unless the authorization is terminated or revoked.  Performed at [facility], [place] [address] [place], [place] [address]   Culture, blood (routine x 2)     Status: Abnormal   Collection Time: 12/10/20  4:06 AM   Specimen: BLOOD RIGHT HAND  Result Value Ref Range Status   Specimen Description BLOOD RIGHT HAND  Final   Special Requests   Final    BOTTLES DRAWN AEROBIC AND ANAEROBIC Blood Culture results may not be optimal due to an inadequate volume of blood received in culture bottles   Culture  Setup Time   Final    GRAM NEGATIVE RODS IN BOTH AEROBIC AND ANAEROBIC  BOTTLES IDENTIFICATION TO FOLLOW CRITICAL RESULT CALLED TO, READ BACK BY AND VERIFIED WITH: Jeanette Caprice [facility] 1607 12/10/20 A BROWNING    Culture (A)  Final    ESCHERICHIA COLI SUSCEPTIBILITIES PERFORMED ON PREVIOUS CULTURE WITHIN THE LAST 5 DAYS. Performed at [facility], [place] [address], [place], [place] [address]    Report Status 12/12/2020 FINAL  Final  Urine culture     Status: Abnormal   Collection Time: 12/10/20  4:40 AM   Specimen: Urine, Clean Catch  Result Value Ref Range Status   Specimen Description URINE, CLEAN CATCH  Final   Special Requests   Final    NONE Performed at [facility], [place] [address], [place], [place] [address]    Culture >=100,000 COLONIES/mL ESCHERICHIA COLI (A)  Final   Report Status 12/12/2020 FINAL  Final   Organism ID, Bacteria ESCHERICHIA COLI (A)  Final      Susceptibility   Escherichia coli - MIC*    AMPICILLIN 8 SENSITIVE Sensitive     CEFAZOLIN <=4 SENSITIVE Sensitive     CEFEPIME <=0.12 SENSITIVE Sensitive     CEFTRIAXONE <=0.25 SENSITIVE Sensitive     CIPROFLOXACIN <=0.25 SENSITIVE Sensitive      GENTAMICIN <=1 SENSITIVE Sensitive     IMIPENEM <=0.25 SENSITIVE Sensitive     NITROFURANTOIN <=16 SENSITIVE Sensitive     TRIMETH/SULFA <=20 SENSITIVE Sensitive     AMPICILLIN/SULBACTAM <=2 SENSITIVE Sensitive     PIP/TAZO <=4 SENSITIVE Sensitive     * >=100,000 COLONIES/mL ESCHERICHIA COLI  MRSA PCR Screening     Status: None   Collection Time: 12/10/20 11:24 AM   Specimen: Nasal Mucosa; Nasopharyngeal  Result Value Ref Range Status   MRSA by PCR NEGATIVE NEGATIVE Final    Comment:        The GeneXpert MRSA Assay (FDA approved for NASAL specimens only), is one component of a comprehensive MRSA colonization surveillance program. It is not intended to diagnose MRSA infection nor to guide or monitor treatment for MRSA infections. Performed at [facility], [place] [address], [place], [place] [address]   Culture, Respiratory w Gram Stain     Status: None   Collection Time: 12/15/20  7:39 AM   Specimen: Tracheal Aspirate; Respiratory  Result Value Ref Range Status   Specimen Description TRACHEAL ASPIRATE  Final   Special Requests NONE  Final   Gram Stain   Final    RARE WBC PRESENT, PREDOMINANTLY PMN MODERATE GRAM NEGATIVE RODS RARE GRAM POSITIVE COCCI    Culture   Final    RARE Consistent with normal respiratory flora. No Pseudomonas species isolated Performed at [place] [address] [place], [place] [address]    Report Status 12/17/2020 FINAL  Final  Culture, BAL-quantitative w Gram Stain     Status: Abnormal   Collection Time: 12/15/20  9:36 AM   Specimen: Bronchoalveolar Lavage; Respiratory  Result Value Ref Range Status   Specimen Description BRONCHIAL ALVEOLAR LAVAGE  Final   Special Requests NONE  Final   Gram Stain   Final    FEW WBC PRESENT,BOTH PMN AND MONONUCLEAR NO ORGANISMS SEEN    Culture (A)  Final    3,000 COLONIES/mL Consistent with normal respiratory flora. Performed at [facility], [place] [address] [place], [place]  [address]    Report Status 12/17/2020 FINAL  Final  Urine Culture     Status: None   Collection Time: 12/16/20  9:53 PM  Specimen: Urine, Cystoscope  Result Value Ref Range Status   Specimen Description URINE, RANDOM  Final   Special Requests PT ON CEFATAN,CYSTO URINE  Final   Culture   Final    NO GROWTH Performed at [facility], [address], [place], [place] [address]    Report Status 12/18/2020 FINAL  Final  Culture, Respiratory w Gram Stain     Status: None   Collection Time: 12/20/20 11:45 AM   Specimen: Tracheal Aspirate; Respiratory  Result Value Ref Range Status   Specimen Description TRACHEAL ASPIRATE  Final   Special Requests NONE  Final   Gram Stain   Final    ABUNDANT WBC PRESENT, PREDOMINANTLY PMN FEW SQUAMOUS EPITHELIAL CELLS PRESENT MODERATE GRAM POSITIVE COCCI IN CLUSTERS RARE GRAM POSITIVE RODS RARE GRAM NEGATIVE RODS Performed at [facility], [place] [address], [place], [place] [address]    Culture FEW STAPHYLOCOCCUS AUREUS  Final   Report Status 12/24/2020 FINAL  Final   Organism ID, Bacteria STAPHYLOCOCCUS AUREUS  Final      Susceptibility   Staphylococcus aureus - MIC*    CIPROFLOXACIN <=0.5 SENSITIVE Sensitive     ERYTHROMYCIN <=0.25 SENSITIVE Sensitive     GENTAMICIN <=0.5 SENSITIVE Sensitive     OXACILLIN 0.5 SENSITIVE Sensitive     TETRACYCLINE <=1 SENSITIVE Sensitive     VANCOMYCIN <=0.5 SENSITIVE Sensitive     TRIMETH/SULFA <=10 SENSITIVE Sensitive     CLINDAMYCIN <=0.25 SENSITIVE Sensitive     RIFAMPIN <=0.5 SENSITIVE Sensitive     Inducible Clindamycin NEGATIVE Sensitive     * FEW STAPHYLOCOCCUS AUREUS  Culture, blood (Routine X 2) w Reflex to ID Panel     Status: Abnormal   Collection Time: 12/25/20  8:07 AM   Specimen: BLOOD LEFT HAND  Result Value Ref Range Status   Specimen Description BLOOD LEFT HAND  Final   Special Requests   Final    BOTTLES DRAWN AEROBIC ONLY Blood Culture results may not be optimal due to an  inadequate volume of blood received in culture bottles   Culture  Setup Time   Final    GRAM POSITIVE COCCI IN CLUSTERS AEROBIC BOTTLE ONLY    Culture (A)  Final    STAPHYLOCOCCUS EPIDERMIDIS SUSCEPTIBILITIES PERFORMED ON PREVIOUS CULTURE WITHIN THE LAST 5 DAYS. Performed at [facility], [place] [address], [place], [place] [address]    Report Status 12/27/2020 FINAL  Final  Culture, blood (Routine X 2) w Reflex to ID Panel     Status: Abnormal   Collection Time: 12/25/20  8:12 AM   Specimen: BLOOD RIGHT HAND  Result Value Ref Range Status   Specimen Description BLOOD RIGHT HAND  Final   Special Requests   Final    BOTTLES DRAWN AEROBIC ONLY Blood Culture results may not be optimal due to an inadequate volume of blood received in culture bottles   Culture  Setup Time   Final    GRAM POSITIVE COCCI IN CLUSTERS AEROBIC BOTTLE ONLY CRITICAL RESULT CALLED TO, READ BACK BY AND VERIFIED WITH: PHARMD E.WOLFE AT 7893 ON 12/25/2020 BY T.SAAD Performed at [facility], [place] [address], [place], [place] [address]    Culture STAPHYLOCOCCUS EPIDERMIDIS (A)  Final   Report Status 12/27/2020 FINAL  Final   Organism ID, Bacteria STAPHYLOCOCCUS EPIDERMIDIS  Final      Susceptibility   Staphylococcus epidermidis - MIC*    CIPROFLOXACIN >=8 RESISTANT Resistant     ERYTHROMYCIN >=  8 RESISTANT Resistant     GENTAMICIN 8 INTERMEDIATE Intermediate     OXACILLIN >=4 RESISTANT Resistant     TETRACYCLINE 2 SENSITIVE Sensitive     VANCOMYCIN 1 SENSITIVE Sensitive     TRIMETH/SULFA 80 RESISTANT Resistant     CLINDAMYCIN >=8 RESISTANT Resistant     RIFAMPIN <=0.5 SENSITIVE Sensitive     Inducible Clindamycin NEGATIVE Sensitive     * STAPHYLOCOCCUS EPIDERMIDIS  Blood Culture ID Panel (Reflexed)     Status: Abnormal   Collection Time: 12/25/20  8:12 AM  Result Value Ref Range Status   Enterococcus faecalis NOT DETECTED NOT DETECTED Final   Enterococcus Faecium NOT DETECTED NOT DETECTED Final    Listeria monocytogenes NOT DETECTED NOT DETECTED Final   Staphylococcus species DETECTED (A) NOT DETECTED Final    Comment: CRITICAL RESULT CALLED TO, READ BACK BY AND VERIFIED WITH: PHARMD E.WOLFE AT 1256 ON 12/25/2020 BY T.SAAD.    Staphylococcus aureus (BCID) NOT DETECTED NOT DETECTED Final   Staphylococcus epidermidis DETECTED (A) NOT DETECTED Final    Comment: Methicillin (oxacillin) resistant coagulase negative staphylococcus. Possible blood culture contaminant (unless isolated from more than one blood culture draw or clinical case suggests pathogenicity). No antibiotic treatment is indicated for blood  culture contaminants.    Staphylococcus lugdunensis NOT DETECTED NOT DETECTED Final   Streptococcus species NOT DETECTED NOT DETECTED Final   Streptococcus agalactiae NOT DETECTED NOT DETECTED Final   Streptococcus pneumoniae NOT DETECTED NOT DETECTED Final   Streptococcus pyogenes NOT DETECTED NOT DETECTED Final   A.calcoaceticus-baumannii NOT DETECTED NOT DETECTED Final   Bacteroides fragilis NOT DETECTED NOT DETECTED Final   Enterobacterales NOT DETECTED NOT DETECTED Final   Enterobacter cloacae complex NOT DETECTED NOT DETECTED Final   Escherichia coli NOT DETECTED NOT DETECTED Final   Klebsiella aerogenes NOT DETECTED NOT DETECTED Final   Klebsiella oxytoca NOT DETECTED NOT DETECTED Final   Klebsiella pneumoniae NOT DETECTED NOT DETECTED Final   Proteus species NOT DETECTED NOT DETECTED Final   Salmonella species NOT DETECTED NOT DETECTED Final   Serratia marcescens NOT DETECTED NOT DETECTED Final   Haemophilus influenzae NOT DETECTED NOT DETECTED Final   Neisseria meningitidis NOT DETECTED NOT DETECTED Final   Pseudomonas aeruginosa NOT DETECTED NOT DETECTED Final   Stenotrophomonas maltophilia NOT DETECTED NOT DETECTED Final   Candida albicans NOT DETECTED NOT DETECTED Final   Candida auris NOT DETECTED NOT DETECTED Final   Candida glabrata NOT DETECTED NOT DETECTED Final    Candida krusei NOT DETECTED NOT DETECTED Final   Candida parapsilosis NOT DETECTED NOT DETECTED Final   Candida tropicalis NOT DETECTED NOT DETECTED Final   Cryptococcus neoformans/gattii NOT DETECTED NOT DETECTED Final   Methicillin resistance mecA/C DETECTED (A) NOT DETECTED Final    Comment: CRITICAL RESULT CALLED TO, READ BACK BY AND VERIFIED WITH: PHARMD E.WOLFE AT 1256 ON 12/25/2020 BY T.SAAD. Performed at [facility], [place] [address] [place], [place] [address]   Culture, blood (routine x 2)     Status: None   Collection Time: 12/26/20  5:43 AM   Specimen: BLOOD  Result Value Ref Range Status   Specimen Description BLOOD RIGHT ANTECUBITAL  Final   Special Requests   Final    BOTTLES DRAWN AEROBIC AND ANAEROBIC Blood Culture adequate volume   Culture   Final    NO GROWTH 5 DAYS Performed at [facility], [place] [address] [place], [place] [address]    Report Status 12/31/2020 FINAL  Final  Culture, blood (routine x 2)     Status: None   Collection Time: 12/26/20  5:43 AM   Specimen: BLOOD RIGHT HAND  Result Value Ref Range Status   Specimen Description BLOOD RIGHT HAND  Final   Special Requests   Final    BOTTLES DRAWN AEROBIC ONLY Blood Culture results may not be optimal due to an inadequate volume of blood received in culture bottles   Culture   Final    NO GROWTH 5 DAYS Performed at [facility], [place] [address] [place], [place] [address]    Report Status 12/31/2020 FINAL  Final  Culture, Urine     Status: None   Collection Time: 01/02/21  4:06 AM   Specimen: Urine, Random  Result Value Ref Range Status   Specimen Description URINE, RANDOM  Final   Special Requests NONE  Final   Culture   Final    NO GROWTH Performed at [facility], [place] [address] [place], [place] [address]    Report Status 01/03/2021 FINAL  Final  Culture, blood (routine x 2)     Status: None (Preliminary result)   Collection Time: 01/02/21  4:19 AM   Specimen: BLOOD  LEFT HAND  Result Value Ref Range Status   Specimen Description BLOOD LEFT HAND  Final   Special Requests AEROBIC BOTTLE ONLY Blood Culture adequate volume  Final   Culture  Setup Time   Final    GRAM POSITIVE COCCI IN CLUSTERS AEROBIC BOTTLE ONLY CRITICAL VALUE NOTED.  VALUE IS CONSISTENT WITH PREVIOUSLY REPORTED AND CALLED VALUE.    Culture   Final    GRAM POSITIVE COCCI IDENTIFICATION TO FOLLOW Performed at [facility], [place] [address], [place], [place] [address]    Report Status PENDING  Incomplete  Culture, blood (routine x 2)     Status: Abnormal (Preliminary result)   Collection Time: 01/02/21  4:32 AM   Specimen: BLOOD LEFT HAND  Result Value Ref Range Status   Specimen Description BLOOD LEFT HAND  Final   Special Requests   Final    BOTTLES DRAWN AEROBIC AND ANAEROBIC Blood Culture results may not be optimal due to an inadequate volume of blood received in culture bottles   Culture  Setup Time   Final    GRAM POSITIVE COCCI ANAEROBIC BOTTLE ONLY CRITICAL RESULT CALLED TO, READ BACK BY AND VERIFIED WITH: PHARMD GREG ABBOTT 01/03/2021 AT 0630 A.HUGHES    Culture (A)  Final    STAPHYLOCOCCUS EPIDERMIDIS SUSCEPTIBILITIES TO FOLLOW Performed at [facility], [place] [address] [place],  [address]    Report Status PENDING  Incomplete  Blood Culture ID Panel (Reflexed)     Status: Abnormal   Collection Time: 01/02/21  4:32 AM  Result Value Ref Range Status   Enterococcus faecalis NOT DETECTED NOT DETECTED Final   Enterococcus Faecium NOT DETECTED NOT DETECTED Final   Listeria monocytogenes NOT DETECTED NOT DETECTED Final   Staphylococcus species DETECTED (A) NOT DETECTED Final    Comment: CRITICAL RESULT CALLED TO, READ BACK BY AND VERIFIED WITH: PHARMD GREG ABBOTT 01/03/2021 AT 0630 A.HUGHES    Staphylococcus aureus (BCID) NOT DETECTED NOT DETECTED Final   Staphylococcus epidermidis DETECTED (A) NOT DETECTED Final    Comment: Methicillin (oxacillin)  resistant coagulase negative staphylococcus. Possible blood culture contaminant (unless isolated from more than one blood culture draw or clinical case suggests pathogenicity). No antibiotic treatment is indicated for blood  culture contaminants. CRITICAL  RESULT CALLED TO, READ BACK BY AND VERIFIED WITH: PHARMD GREG ABBOTT 01/03/2021 AT 0630 A.HUGHES    Staphylococcus lugdunensis NOT DETECTED NOT DETECTED Final   Streptococcus species NOT DETECTED NOT DETECTED Final   Streptococcus agalactiae NOT DETECTED NOT DETECTED Final   Streptococcus pneumoniae NOT DETECTED NOT DETECTED Final   Streptococcus pyogenes NOT DETECTED NOT DETECTED Final   A.calcoaceticus-baumannii NOT DETECTED NOT DETECTED Final   Bacteroides fragilis NOT DETECTED NOT DETECTED Final   Enterobacterales NOT DETECTED NOT DETECTED Final   Enterobacter cloacae complex NOT DETECTED NOT DETECTED Final   Escherichia coli NOT DETECTED NOT DETECTED Final   Klebsiella aerogenes NOT DETECTED NOT DETECTED Final   Klebsiella oxytoca NOT DETECTED NOT DETECTED Final   Klebsiella pneumoniae NOT DETECTED NOT DETECTED Final   Proteus species NOT DETECTED NOT DETECTED Final   Salmonella species NOT DETECTED NOT DETECTED Final   Serratia marcescens NOT DETECTED NOT DETECTED Final   Haemophilus influenzae NOT DETECTED NOT DETECTED Final   Neisseria meningitidis NOT DETECTED NOT DETECTED Final   Pseudomonas aeruginosa NOT DETECTED NOT DETECTED Final   Stenotrophomonas maltophilia NOT DETECTED NOT DETECTED Final   Candida albicans NOT DETECTED NOT DETECTED Final   Candida auris NOT DETECTED NOT DETECTED Final   Candida glabrata NOT DETECTED NOT DETECTED Final   Candida krusei NOT DETECTED NOT DETECTED Final   Candida parapsilosis NOT DETECTED NOT DETECTED Final   Candida tropicalis NOT DETECTED NOT DETECTED Final   Cryptococcus neoformans/gattii NOT DETECTED NOT DETECTED Final   Methicillin resistance mecA/C DETECTED (A) NOT DETECTED Final     Comment: CRITICAL RESULT CALLED TO, READ BACK BY AND VERIFIED WITH: PHARMD GREG ABBOTT 01/03/2021 AT 0630 A.HUGHES Performed at [facility], [place] [address], [place], [place] [address]   Culture, blood (Routine X 2) w Reflex to ID Panel     Status: None (Preliminary result)   Collection Time: 01/03/21 11:31 AM   Specimen: BLOOD LEFT HAND  Result Value Ref Range Status   Specimen Description BLOOD LEFT HAND  Final   Special Requests   Final    BOTTLES DRAWN AEROBIC ONLY Blood Culture adequate volume   Culture  Setup Time   Final    GRAM NEGATIVE RODS AEROBIC BOTTLE ONLY PHARMD GREG ABBOTT 01/04/2021 AT 0425 A.HUGHES CRITICAL VALUE NOTED.  VALUE IS CONSISTENT WITH PREVIOUSLY REPORTED AND CALLED VALUE.    Culture   Final    NO GROWTH < 24 HOURS Performed at [facility], [place] [address], [place], [place] [address]    Report Status PENDING  Incomplete  Culture, blood (Routine X 2) w Reflex to ID Panel     Status: None (Preliminary result)   Collection Time: 01/03/21 11:31 AM   Specimen: BLOOD LEFT FOREARM  Result Value Ref Range Status   Specimen Description BLOOD LEFT FOREARM  Final   Special Requests   Final    BOTTLES DRAWN AEROBIC ONLY Blood Culture adequate volume   Culture  Setup Time   Final    GRAM NEGATIVE RODS AEROBIC BOTTLE ONLY CRITICAL RESULT CALLED TO, READ BACK BY AND VERIFIED WITH: PHARMD GREG ABBOTT 01/04/2021 AT 0425 A.HUGHES Performed at [facility], [place] [address], [place], [place] [address]    Culture GRAM NEGATIVE RODS  Final   Report Status PENDING  Incomplete  Blood Culture ID Panel (Reflexed)     Status: Abnormal   Collection Time: 01/03/21 11:31 AM  Result Value Ref Range Status  Enterococcus faecalis NOT DETECTED NOT DETECTED Final   Enterococcus Faecium NOT DETECTED NOT DETECTED Final   Listeria monocytogenes NOT DETECTED NOT DETECTED Final   Staphylococcus species NOT DETECTED NOT DETECTED Final   Staphylococcus aureus  (BCID) NOT DETECTED NOT DETECTED Final   Staphylococcus epidermidis NOT DETECTED NOT DETECTED Final   Staphylococcus lugdunensis NOT DETECTED NOT DETECTED Final   Streptococcus species NOT DETECTED NOT DETECTED Final   Streptococcus agalactiae NOT DETECTED NOT DETECTED Final   Streptococcus pneumoniae NOT DETECTED NOT DETECTED Final   Streptococcus pyogenes NOT DETECTED NOT DETECTED Final   A.calcoaceticus-baumannii NOT DETECTED NOT DETECTED Final   Bacteroides fragilis NOT DETECTED NOT DETECTED Final   Enterobacterales DETECTED (A) NOT DETECTED Final    Comment: Enterobacterales represent a large order of gram negative bacteria, not a single organism. CRITICAL RESULT CALLED TO, READ BACK BY AND VERIFIED WITH: PHARMD GREG ABBOTT 01/04/2021 AT 0425 A.HUGHES    Enterobacter cloacae complex NOT DETECTED NOT DETECTED Final   Escherichia coli DETECTED (A) NOT DETECTED Final    Comment: CRITICAL RESULT CALLED TO, READ BACK BY AND VERIFIED WITH: PHARMD GREG ABBOTT 01/04/2021 AT 0425 A.HUGHES    Klebsiella aerogenes NOT DETECTED NOT DETECTED Final   Klebsiella oxytoca NOT DETECTED NOT DETECTED Final   Klebsiella pneumoniae NOT DETECTED NOT DETECTED Final   Proteus species NOT DETECTED NOT DETECTED Final   Salmonella species NOT DETECTED NOT DETECTED Final   Serratia marcescens NOT DETECTED NOT DETECTED Final   Haemophilus influenzae NOT DETECTED NOT DETECTED Final   Neisseria meningitidis NOT DETECTED NOT DETECTED Final   Pseudomonas aeruginosa NOT DETECTED NOT DETECTED Final   Stenotrophomonas maltophilia NOT DETECTED NOT DETECTED Final   Candida albicans NOT DETECTED NOT DETECTED Final   Candida auris NOT DETECTED NOT DETECTED Final   Candida glabrata NOT DETECTED NOT DETECTED Final   Candida krusei NOT DETECTED NOT DETECTED Final   Candida parapsilosis NOT DETECTED NOT DETECTED Final   Candida tropicalis NOT DETECTED NOT DETECTED Final   Cryptococcus neoformans/gattii NOT DETECTED NOT  DETECTED Final   CTX-M ESBL NOT DETECTED NOT DETECTED Final   Carbapenem resistance IMP NOT DETECTED NOT DETECTED Final   Carbapenem resistance KPC NOT DETECTED NOT DETECTED Final   Carbapenem resistance NDM NOT DETECTED NOT DETECTED Final   Carbapenem resist OXA 48 LIKE NOT DETECTED NOT DETECTED Final   Carbapenem resistance VIM NOT DETECTED NOT DETECTED Final    Comment: Performed at [facility], [address], [place], [place] [address]    Pertinent Lab. CBC Latest Ref Rng & Units 01/04/2021 01/03/2021 01/02/2021  WBC 4.0 - 10.5 K/uL 11.7(H) 10.8(H) 9.3  Hemoglobin 13.0 - 17.0 g/dL 9.5(L) 10.0(L) 8.9(L)  Hematocrit 39.0 - 52.0 % 29.9(L) 31.6(L) 28.6(L)  Platelets 150 - 400 K/uL 190 175 173   CMP Latest Ref Rng & Units 01/04/2021 01/03/2021 01/02/2021  Glucose 70 - 99 mg/dL 106(H) 195(H) 166(H)  BUN 6 - 20 mg/dL 57(H) 54(H) 55(H)  Creatinine 0.61 - 1.24 mg/dL 0.99 1.05 1.20  Sodium 135 - 145 mmol/L 144 143 145  Potassium 3.5 - 5.1 mmol/L 3.4(L) 4.1 4.0  Chloride 98 - 111 mmol/L 108 108 111  CO2 22 - 32 mmol/L _0 Calcium 8.9 - 10.3 mg/dL 8.7(L) 8.9 8.8(L)  Total Protein 6.5 - 8.1 g/dL - - 7.2  Total Bilirubin 0.3 - 1.2 mg/dL - - 0.5  Alkaline Phos 38 - 126 U/L - - 62  AST 15 - 41  U/L - - 22  ALT 0 - 44 U/L - - 14    Pertinent Imaging today Plain films and CT images have been personally visualized and interpreted; radiology reports have been reviewed. Decision making incorporated into the Impression / Recommendations.  Chest Xray 01/03/21 FINDINGS: Right arm PICC line tip is in the cavoatrial junction. Feeding tube tip is well below the GE junction. Stable cardiomediastinal contours. Platelike atelectasis in the left lower lung is unchanged. Pulmonary vascular congestion appears unchanged from previous exam. Mild patchy airspace densities within the right midlung appear similar to previous exam.  IMPRESSION: 1. No change in aeration to the lungs compared with  previous exam. 2. Stable support apparatus.  I have spent approx 30 minutes for this patient encounter including review of prior medical records, coordination of care  with greater than 50% of time being face to face/counseling and discussing diagnostics/treatment plan with the patient/family.  Electronically signed by:   Rosiland Oz, MD Infectious Disease Physician [facility] Pager: [phone]

## 2021-01-04 NOTE — Progress Notes (Signed)
Bilateral upper and lower extremity venous duplex completed. Refer to "CV Proc" under chart review to view preliminary results.  01/04/2021 4:57 PM Eula Fried., MHA, RVT, RDCS, RDMS

## 2021-01-05 ENCOUNTER — Inpatient Hospital Stay (HOSPITAL_COMMUNITY): Payer: Medicaid Other

## 2021-01-05 LAB — BASIC METABOLIC PANEL
Anion gap: 8 (ref 5–15)
BUN: 56 mg/dL — ABNORMAL HIGH (ref 6–20)
CO2: 27 mmol/L (ref 22–32)
Calcium: 8.7 mg/dL — ABNORMAL LOW (ref 8.9–10.3)
Chloride: 111 mmol/L (ref 98–111)
Creatinine, Ser: 1.03 mg/dL (ref 0.61–1.24)
GFR, Estimated: >60 mL/min (ref >60)
Glucose, Bld: 136 mg/dL — ABNORMAL HIGH (ref 70–99)
Potassium: 3.7 mmol/L (ref 3.5–5.1)
Sodium: 146 mmol/L — ABNORMAL HIGH (ref 135–145)

## 2021-01-05 LAB — HEPATITIS C ANTIBODY: HCV Ab: NONREACTIVE

## 2021-01-05 LAB — CBC WITH DIFFERENTIAL/PLATELET
Abs Immature Granulocytes: 0.07 10*3/uL (ref 0.00–0.07)
Basophils Absolute: 0 10*3/uL (ref 0.0–0.1)
Basophils Relative: 0 %
Eosinophils Absolute: 0.1 10*3/uL (ref 0.0–0.5)
Eosinophils Relative: 1 %
HCT: 30.5 % — ABNORMAL LOW (ref 39.0–52.0)
Hemoglobin: 9.7 g/dL — ABNORMAL LOW (ref 13.0–17.0)
Immature Granulocytes: 1 %
Lymphocytes Relative: 12 %
Lymphs Abs: 1.4 10*3/uL (ref 0.7–4.0)
MCH: 31.3 pg (ref 26.0–34.0)
MCHC: 31.8 g/dL (ref 30.0–36.0)
MCV: 98.4 fL (ref 80.0–100.0)
Monocytes Absolute: 0.5 10*3/uL (ref 0.1–1.0)
Monocytes Relative: 4 %
Neutro Abs: 9.6 10*3/uL — ABNORMAL HIGH (ref 1.7–7.7)
Neutrophils Relative %: 82 %
Platelets: 186 10*3/uL (ref 150–400)
RBC: 3.1 MIL/uL — ABNORMAL LOW (ref 4.22–5.81)
RDW: 14 % (ref 11.5–15.5)
WBC: 11.7 10*3/uL — ABNORMAL HIGH (ref 4.0–10.5)
nRBC: 0 % (ref 0.0–0.2)

## 2021-01-05 LAB — CULTURE, BLOOD (ROUTINE X 2): Special Requests: ADEQUATE

## 2021-01-05 LAB — GLUCOSE, CAPILLARY
Glucose-Capillary: 112 mg/dL — ABNORMAL HIGH (ref 70–99)
Glucose-Capillary: 149 mg/dL — ABNORMAL HIGH (ref 70–99)
Glucose-Capillary: 121 mg/dL — ABNORMAL HIGH (ref 70–99)
Glucose-Capillary: 179 mg/dL — ABNORMAL HIGH (ref 70–99)
Glucose-Capillary: 136 mg/dL — ABNORMAL HIGH (ref 70–99)
Glucose-Capillary: 149 mg/dL — ABNORMAL HIGH (ref 70–99)

## 2021-01-05 LAB — HEPATITIS B SURFACE ANTIGEN: Hepatitis B Surface Ag: NONREACTIVE

## 2021-01-05 LAB — HEPATITIS B CORE ANTIBODY, TOTAL: Hep B Core Total Ab: NONREACTIVE

## 2021-01-05 MED ORDER — ACETAMINOPHEN 325 MG PO TABS
650.0000 mg | ORAL_TABLET | Freq: Four times a day (QID) | ORAL | Status: DC
  Administered 2021-01-05 – 2021-01-11 (×24): 650 mg via ORAL
  Filled 2021-01-05 (×23): qty 2

## 2021-01-05 MED ORDER — KETOROLAC TROMETHAMINE 15 MG/ML IJ SOLN
15.0000 mg | Freq: Once | INTRAMUSCULAR | Status: AC
  Administered 2021-01-05: 15 mg via INTRAVENOUS
  Filled 2021-01-05: qty 1

## 2021-01-05 MED ORDER — OXYCODONE HCL 5 MG/5ML PO SOLN
10.0000 mg | Freq: Four times a day (QID) | ORAL | Status: DC
  Administered 2021-01-06 – 2021-01-13 (×30): 10 mg
  Filled 2021-01-05 (×30): qty 10

## 2021-01-05 MED ORDER — SODIUM CHLORIDE 0.9 % IV SOLN
9.0000 mg/kg | Freq: Every day | INTRAVENOUS | Status: DC
  Filled 2021-01-05: qty 18

## 2021-01-05 MED ORDER — SODIUM CHLORIDE 0.9 % IV SOLN
9.0000 mg/kg | Freq: Every day | INTRAVENOUS | Status: DC
  Administered 2021-01-05 – 2021-01-12 (×8): 900 mg via INTRAVENOUS
  Filled 2021-01-05 (×10): qty 18

## 2021-01-05 MED ORDER — QUETIAPINE FUMARATE 25 MG PO TABS
25.0000 mg | ORAL_TABLET | Freq: Every day | ORAL | Status: DC
  Administered 2021-01-05 – 2021-01-10 (×6): 25 mg via ORAL
  Filled 2021-01-05 (×6): qty 1

## 2021-01-05 NOTE — Progress Notes (Signed)
Nutrition Follow-up  DOCUMENTATION CODES:   Obesity unspecified  INTERVENTION:  Continue TF via Cortrak: -Osmolite 1.2 at 75 ml/h (1800 ml per day). -90ml Prosource TF QID -200ml free water Q4H (per MD) -Juven BID via tube  Provides 2480 kcal, 188 gm protein, 1476 ml free water daily (2676ml total free water).  NUTRITION DIAGNOSIS:   Increased nutrient needs related to wound healing as evidenced by estimated needs.  Ongoing   GOAL:   Patient will meet greater than or equal to 90% of their needs  Met with TF  MONITOR:   TF tolerance,Labs,Diet advancement,PO intake  REASON FOR ASSESSMENT:   Ventilator,Consult Enteral/tube feeding initiation and management  ASSESSMENT:   60 yo male admitted with acute metabolic encephalopathy, acute respiratory failure, septic shock, E coli bacteremia, E coli UTI. PMH includes recent severe diarrhea and vomiting, chronic pain, HTN, HLD.  3/31 - intubated 4/5 - extubated then immediately reintubated d/t secretions  4/6 - s/p ureteral stent placement 4/16 - extubated 4/18 - cortrak placed (gastric tip) 4/21 - dysphagia 2 diet with honey thick liquids ordered s/p BSE 4/22 - tx to TRH 4/24 - blood cultures w/ staph epidermidis bacteremia/gram-negative rods bacteremia (ID following), PICC removed; off O2; diet downgraded to dysphagia 1 with honey thick liquids s/p BSE 4/25 - PCCM signed off  Per MD, pt having intermittent fever issues and persistent bacteremia. Pt to have repeat blood culture/urine culture, hepatitis serologies sent today, and pt may need TEE. CT abdomen/pelvis pending.   Pt continues to receive hydrotherapy to buttock wound.  Pt sleeping at time of RD visit and did not wake to RD voice. RD observed untouched meal tray in room. Discussed pt with RN who reports pt with agitation and becoming combative when receiving care. Also reports pt with no PO intake yesterday or today.  Recommend continue to meet 100% of pt's  needs via TF until mentation/agitation improves. RN reports pt tolerating current regimen -- Osmolite 1.2 @ 75ml/h w/ 90ml Prosource TF QID, 200ml free water Q4H  UOP: 3.4L x24 hours I/O: -16,233.3ml since admit Wt continues to trend down, though edema also noted to be improving. Per RN assessment, pt w/ non-pitting generalized edema  Medications: Colace, SSI Q4H, 8 units novolog Q4H, 35 units Levemir BID, Miralax, Juven BID per tube, vitamin B-12 Labs: Na 146 (H) CBGs 112-149-121  Diet Order:   Diet Order            DIET - DYS 1 Room service appropriate? No; Fluid consistency: Honey Thick  Diet effective now                 EDUCATION NEEDS:   Not appropriate for education at this time  Skin:  Skin Assessment: Skin Integrity Issues: Skin Integrity Issues:: Stage III Stage III: R buttocks Unstageable: DTPI to the left foot and sacral area Incisions: penis  Last BM:  4/26  Height:   Ht Readings from Last 1 Encounters:  12/25/20 6' 2" (1.88 m)    Weight:   Wt Readings from Last 1 Encounters:  01/03/21 120.6 kg    Ideal Body Weight:  86.4 kg  BMI:  Body mass index is 34.14 kg/m.  Estimated Nutritional Needs:   Kcal:  2160-2590  Protein:  173-216 gm  Fluid:  >/= 2 L    , MS, RD, LDN RD pager number and weekend/on-call pager number located in Amion.  

## 2021-01-05 NOTE — Consult Note (Signed)
Palliative Care Consultation Reason: Goals of Care Requested by: [facility]  Mr. Harshfield is a 60 yo man with history of obesity, DM2, HTN, chronic pain, kidney stones and poorly healing lower extremity wounds who was admitted on 3/31 after experiencing an acute illness with NVD and changes in his mental status found to have severe sepsis. He has required ICU level care and has had multiple complications of his life-threatening illness including hypoxemic respiratory failure requiring period of mechanical ventilation, CVA fronto-temporal region-right, full thickness decubitus ulcer on his sacrum that will require advanced interventions and continues to have cognitive problems- either as a result of the stroke or the critical illness and hypoxemia. He may also have early age advanced vascular dementia or PVD  based on review of his history.  Palliative care has been consulted for goals of care, code status discussion and symptom management. Today family was not at the bedside- he was receiving personal care while I was in the room and is a total assist and is not able to communicate with me. He appears to be in significant pain and discomfort with turning and repositioning.   Recommendations:  1. I will contact his wife to discuss his goals of care and provide support. 2. I have increased his oxycodone to 10mg  q6 per tune scheduled- he has been on long standing oxycodone prescribed a pain management physiatrist with an orthopedic group-I anticipate we will need to increase this dose in the next 24 gours-will also need to follow for any issues with constipation but for now he is having frequent loose stools.  3. His prognosis is largely unknown but he will clearly have debility and deficits following this critical illness if he survives and a very long road ahead that may not return him to a life where he is able to maintain his independence.   , DO Palliative Medicine  Time: 30  minutes Greater than 50%  of this time was spent counseling and coordinating care related to the above assessment and plan.

## 2021-01-05 NOTE — Progress Notes (Addendum)
[facility] Infectious Diseases Follow Up Note  Patient Identification: Patient Name: [name] MRN: [id number] [place] Date: 12/10/2020  3:36 AM Age: 60 y.o.Today's Date: 01/05/2021   Reason for Visit: Follow-up on MRSE bacteremia  Active Problems:   Acute respiratory failure ([place])   AKI (acute kidney injury) ([place])   Bacteremia due to Escherichia coli   Ureteral stone   Pyelonephritis   Cerebral thrombosis with cerebral infarction   Staphylococcus epidermidis bacteremia   Pressure injury of skin   Antibiotics: Ceftriaxone 3/31-4/3, 4/7-4/11, 4/14-4/15, ceftriaxone 4/24-                     Vancomycin 4/14-   Lines/Tubes: PIV's, urethral catheter, NG tube  Interval Events: Fever with T-max of 102.4 on spectrum antibiotics, mild leukocytosis of 11.7.  TTE unremarkable.  Duplex of upper and lower extremity resolved DVT seen in the left IJ and left subclavian vein   Assessment MRSE bacteremia, CLA BSI with possible septic thrombophlebitis of left IJ and left subclavian vein on Doppler 4/15 status post removal of PICC line on 4/24.  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 coli bacteremia.  This will also cover for MSSA pneumonia I will order CT abdomen pelvis given recurrent E. coli bacteremia in the setting of prior pyelonephritis/stent placement/ureteral  stone Follow-up hepatitis serologies I also agree with getting CT head given concern for his mental status by nursing staff If CT abd/pelvis is non remarkable and patient continues to be febrile, will plan on getting a TEE. Her buttock ulcers could also be contributing to fevers. WOC has been following the buttock ulcer. ? Needs surgical debridement   Following  Rest of the management as per the primary team. Thank you for the consult. Please page with pertinent questions or concerns.  ______________________________________________________________________ Subjective patient seen and examined at the bedside.  He is able to whisper and able to follow some commands.  He is not able to move his left upper extremity.  His RN is concerned about his mental status and thinks should get a CT of his head.  Vitals BP (!) 145/71 (BP Location: Left Leg)   Pulse (!) 110   Temp 99.8 F (37.7 C) (Oral)   Resp 20   Ht _0  (1.88 m)   Wt 120.6 kg   SpO2 97%   BMI 34.14 kg/m     Physical Exam Constitutional:   Lying in bed, mouth open, has NG tube    Comments:   Cardiovascular:     Rate and Rhythm: Normal rate and regular rhythm.     Heart sounds: No murmur heard.   Pulmonary:     Effort: Pulmonary effort is normal.     Comments: Coarse breath sounds  Abdominal:     Palpations: Abdomen is soft.     Tenderness: Nontender, bowel sounds present  Musculoskeletal:        General: No swelling or tenderness, not able to move left upper extremity.   Skin:    Comments: No obvious  rashes  Neurological:     General: Left upper extremity weakness  Psychiatric:        Mood and Affect: Unable to assess   Pertinent Microbiology Results for orders placed or performed during the hospital encounter of 12/10/20  Culture, blood (routine x 2)     Status: Abnormal   Collection Time: 12/10/20  3:50 AM   Specimen: BLOOD  Result Value Ref Range Status   Specimen Description BLOOD LEFT ANTECUBITAL   Final   Special Requests   Final    BOTTLES DRAWN AEROBIC AND ANAEROBIC Blood Culture adequate volume   Culture  Setup Time   Final    GRAM NEGATIVE RODS IN BOTH AEROBIC AND ANAEROBIC BOTTLES CRITICAL RESULT CALLED TO, READ BACK BY AND VERIFIED WITHJeanette Caprice [facility] [id number] 12/10/20 A BROWNING Performed at [facility], [place] [address] [place], [place] [address]    Culture ESCHERICHIA COLI (A)  Final   Report Status 12/12/2020 FINAL  Final   Organism ID, Bacteria ESCHERICHIA COLI  Final      Susceptibility   Escherichia coli - MIC*    AMPICILLIN 8 SENSITIVE Sensitive     CEFAZOLIN <=4 SENSITIVE Sensitive     CEFEPIME <=0.12 SENSITIVE Sensitive     CEFTAZIDIME <=1 SENSITIVE Sensitive     CEFTRIAXONE <=0.25 SENSITIVE Sensitive     CIPROFLOXACIN <=0.25 SENSITIVE Sensitive     GENTAMICIN <=1 SENSITIVE Sensitive     IMIPENEM <=0.25 SENSITIVE Sensitive     TRIMETH/SULFA <=20 SENSITIVE Sensitive     AMPICILLIN/SULBACTAM <=2 SENSITIVE Sensitive     PIP/TAZO <=4 SENSITIVE Sensitive     * ESCHERICHIA COLI  Blood Culture ID Panel (Reflexed)     Status: Abnormal   Collection Time: 12/10/20  3:50 AM  Result Value Ref Range Status   Enterococcus faecalis NOT DETECTED NOT DETECTED Final   Enterococcus Faecium NOT DETECTED NOT DETECTED Final   Listeria monocytogenes NOT DETECTED NOT DETECTED Final   Staphylococcus species NOT DETECTED NOT DETECTED Final   Staphylococcus aureus (BCID) NOT DETECTED NOT DETECTED Final   Staphylococcus epidermidis NOT DETECTED NOT DETECTED Final   Staphylococcus lugdunensis NOT DETECTED NOT DETECTED Final   Streptococcus species NOT DETECTED NOT DETECTED Final   Streptococcus agalactiae NOT DETECTED NOT DETECTED Final   Streptococcus pneumoniae NOT DETECTED NOT DETECTED Final   Streptococcus pyogenes NOT DETECTED NOT DETECTED Final   A.calcoaceticus-baumannii NOT DETECTED NOT DETECTED Final   Bacteroides fragilis NOT DETECTED NOT DETECTED Final    Enterobacterales DETECTED (A) NOT DETECTED Final    Comment: Enterobacterales represent a large order of gram negative bacteria, not a single organism. CRITICAL RESULT CALLED TO, READ BACK BY AND VERIFIED WITH: Jeanette Caprice PHARMD [id number] 12/10/20 A BROWNING    Enterobacter cloacae complex NOT DETECTED NOT DETECTED Final   Escherichia coli DETECTED (A) NOT DETECTED Final    Comment: CRITICAL RESULT CALLED TO, READ BACK BY AND VERIFIED WITH: Jeanette Caprice PHARMD [id number] 12/10/20 A BROWNING    Klebsiella aerogenes NOT DETECTED NOT DETECTED Final   Klebsiella oxytoca NOT DETECTED NOT DETECTED Final   Klebsiella pneumoniae NOT DETECTED NOT DETECTED Final   Proteus species NOT DETECTED NOT DETECTED Final   Salmonella species NOT DETECTED NOT DETECTED Final   Serratia marcescens NOT DETECTED NOT DETECTED Final   Haemophilus influenzae NOT DETECTED NOT DETECTED Final   Neisseria meningitidis NOT DETECTED NOT DETECTED Final   Pseudomonas aeruginosa NOT DETECTED NOT DETECTED Final   Stenotrophomonas maltophilia NOT DETECTED  NOT DETECTED Final   Candida albicans NOT DETECTED NOT DETECTED Final   Candida auris NOT DETECTED NOT DETECTED Final   Candida glabrata NOT DETECTED NOT DETECTED Final   Candida krusei NOT DETECTED NOT DETECTED Final   Candida parapsilosis NOT DETECTED NOT DETECTED Final   Candida tropicalis NOT DETECTED NOT DETECTED Final   Cryptococcus neoformans/gattii NOT DETECTED NOT DETECTED Final   CTX-M ESBL NOT DETECTED NOT DETECTED Final   Carbapenem resistance IMP NOT DETECTED NOT DETECTED Final   Carbapenem resistance KPC NOT DETECTED NOT DETECTED Final   Carbapenem resistance NDM NOT DETECTED NOT DETECTED Final   Carbapenem resist OXA 48 LIKE NOT DETECTED NOT DETECTED Final   Carbapenem resistance VIM NOT DETECTED NOT DETECTED Final    Comment: Performed at [facility], [address] [place], [place] [address]  Resp Panel by RT-PCR (Flu A&B, Covid) Nasopharyngeal Swab     Status:  None   Collection Time: 12/10/20  3:59 AM   Specimen: Nasopharyngeal Swab; Nasopharyngeal(NP) swabs in vial transport medium  Result Value Ref Range Status   SARS Coronavirus 2 by RT PCR NEGATIVE NEGATIVE Final    Comment: (NOTE) SARS-CoV-2 target nucleic acids are NOT DETECTED.  The SARS-CoV-2 RNA is generally detectable in upper respiratory specimens during the acute phase of infection. The lowest concentration of SARS-CoV-2 viral copies this assay can detect is 138 copies/mL. A negative result does not preclude SARS-Cov-2 infection and should not be used as the sole basis for treatment or other patient management decisions. A negative result may occur with  improper specimen collection/handling, submission of specimen other than nasopharyngeal swab, presence of viral mutation(s) within the areas targeted by this assay, and inadequate number of viral copies(<138 copies/mL). A negative result must be combined with clinical observations, patient history, and epidemiological information. The expected result is Negative.  Fact Sheet for Patients:  EntrepreneurPulse.com.au  Fact Sheet for Healthcare Providers:  IncredibleEmployment.be  This test is no t yet approved or cleared by the [place] FDA and  has been authorized for detection and/or diagnosis of SARS-CoV-2 by FDA under an Emergency Use Authorization (EUA). This EUA will remain  in effect (meaning this test can be used) for the duration of the COVID-19 declaration under Section 564(b)(1) of the Act, 21 U.S.C.section 360bbb-3(b)(1), unless the authorization is terminated  or revoked sooner.       Influenza A by PCR NEGATIVE NEGATIVE Final   Influenza B by PCR NEGATIVE NEGATIVE Final    Comment: (NOTE) The Xpert Xpress SARS-CoV-2/FLU/RSV plus assay is intended as an aid in the diagnosis of influenza from Nasopharyngeal swab specimens and should not be used as a sole basis for  treatment. Nasal washings and aspirates are unacceptable for Xpert Xpress SARS-CoV-2/FLU/RSV testing.  Fact Sheet for Patients: EntrepreneurPulse.com.au  Fact Sheet for Healthcare Providers: IncredibleEmployment.be  This test is not yet approved or cleared by the [place] FDA and has been authorized for detection and/or diagnosis of SARS-CoV-2 by FDA under an Emergency Use Authorization (EUA). This EUA will remain in effect (meaning this test can be used) for the duration of the COVID-19 declaration under Section 564(b)(1) of the Act, 21 U.S.C. section 360bbb-3(b)(1), unless the authorization is terminated or revoked.  Performed at [facility], [place] [address], [place], [place] [address]   Culture, blood (routine x 2)     Status: Abnormal   Collection Time: 12/10/20  4:06 AM   Specimen: BLOOD RIGHT HAND  Result Value  Ref Range Status   Specimen Description BLOOD RIGHT HAND  Final   Special Requests   Final    BOTTLES DRAWN AEROBIC AND ANAEROBIC Blood Culture results may not be optimal due to an inadequate volume of blood received in culture bottles   Culture  Setup Time   Final    GRAM NEGATIVE RODS IN BOTH AEROBIC AND ANAEROBIC BOTTLES IDENTIFICATION TO FOLLOW CRITICAL RESULT CALLED TO, READ BACK BY AND VERIFIED WITHJeanette Caprice PHARMD 1431 12/10/20 A BROWNING    Culture (A)  Final    ESCHERICHIA COLI SUSCEPTIBILITIES PERFORMED ON PREVIOUS CULTURE WITHIN THE LAST 5 DAYS. Performed at [facility], [place] [address], [place], [place] [address]    Report Status 12/12/2020 FINAL  Final  Urine culture     Status: Abnormal   Collection Time: 12/10/20  4:40 AM   Specimen: Urine, Clean Catch  Result Value Ref Range Status   Specimen Description URINE, CLEAN CATCH  Final   Special Requests   Final    NONE Performed at [facility], [place] [address] [place], [place] [address]    Culture >=100,000 COLONIES/mL ESCHERICHIA COLI  (A)  Final   Report Status 12/12/2020 FINAL  Final   Organism ID, Bacteria ESCHERICHIA COLI (A)  Final      Susceptibility   Escherichia coli - MIC*    AMPICILLIN 8 SENSITIVE Sensitive     CEFAZOLIN <=4 SENSITIVE Sensitive     CEFEPIME <=0.12 SENSITIVE Sensitive     CEFTRIAXONE <=0.25 SENSITIVE Sensitive     CIPROFLOXACIN <=0.25 SENSITIVE Sensitive     GENTAMICIN <=1 SENSITIVE Sensitive     IMIPENEM <=0.25 SENSITIVE Sensitive     NITROFURANTOIN <=16 SENSITIVE Sensitive     TRIMETH/SULFA <=20 SENSITIVE Sensitive     AMPICILLIN/SULBACTAM <=2 SENSITIVE Sensitive     PIP/TAZO <=4 SENSITIVE Sensitive     * >=100,000 COLONIES/mL ESCHERICHIA COLI  MRSA PCR Screening     Status: None   Collection Time: 12/10/20 11:24 AM   Specimen: Nasal Mucosa; Nasopharyngeal  Result Value Ref Range Status   MRSA by PCR NEGATIVE NEGATIVE Final    Comment:        The GeneXpert MRSA Assay (FDA approved for NASAL specimens only), is one component of a comprehensive MRSA colonization surveillance program. It is not intended to diagnose MRSA infection nor to guide or monitor treatment for MRSA infections. Performed at [facility], [place] [address] [place], [place] [address]   Culture, Respiratory w Gram Stain     Status: None   Collection Time: 12/15/20  7:39 AM   Specimen: Tracheal Aspirate; Respiratory  Result Value Ref Range Status   Specimen Description TRACHEAL ASPIRATE  Final   Special Requests NONE  Final   Gram Stain   Final    RARE WBC PRESENT, PREDOMINANTLY PMN MODERATE GRAM NEGATIVE RODS RARE GRAM POSITIVE COCCI    Culture   Final    RARE Consistent with normal respiratory flora. No Pseudomonas species isolated Performed at [facility] [address] [place], [place] [address]    Report Status 12/17/2020 FINAL  Final  Culture, BAL-quantitative w Gram Stain     Status: Abnormal   Collection Time: 12/15/20  9:36 AM   Specimen: Bronchoalveolar Lavage; Respiratory   Result Value Ref Range Status   Specimen Description BRONCHIAL ALVEOLAR LAVAGE  Final   Special Requests NONE  Final   Gram Stain   Final  FEW WBC PRESENT,BOTH PMN AND MONONUCLEAR NO ORGANISMS SEEN    Culture (A)  Final    3,000 COLONIES/mL Consistent with normal respiratory flora. Performed at [facility], [place] [address], [place], [place] [address]    Report Status 12/17/2020 FINAL  Final  Urine Culture     Status: None   Collection Time: 12/16/20  9:53 PM   Specimen: Urine, Cystoscope  Result Value Ref Range Status   Specimen Description URINE, RANDOM  Final   Special Requests PT ON CEFATAN,CYSTO URINE  Final   Culture   Final    NO GROWTH Performed at [facility], [place] [address] [place], [place] [address]    Report Status 12/18/2020 FINAL  Final  Culture, Respiratory w Gram Stain     Status: None   Collection Time: 12/20/20 11:45 AM   Specimen: Tracheal Aspirate; Respiratory  Result Value Ref Range Status   Specimen Description TRACHEAL ASPIRATE  Final   Special Requests NONE  Final   Gram Stain   Final    ABUNDANT WBC PRESENT, PREDOMINANTLY PMN FEW SQUAMOUS EPITHELIAL CELLS PRESENT MODERATE GRAM POSITIVE COCCI IN CLUSTERS RARE GRAM POSITIVE RODS RARE GRAM NEGATIVE RODS Performed at [facility], [place] [address] [place], [place] [address]    Culture FEW STAPHYLOCOCCUS AUREUS  Final   Report Status 12/24/2020 FINAL  Final   Organism ID, Bacteria STAPHYLOCOCCUS AUREUS  Final      Susceptibility   Staphylococcus aureus - MIC*    CIPROFLOXACIN <=0.5 SENSITIVE Sensitive     ERYTHROMYCIN <=0.25 SENSITIVE Sensitive     GENTAMICIN <=0.5 SENSITIVE Sensitive     OXACILLIN 0.5 SENSITIVE Sensitive     TETRACYCLINE <=1 SENSITIVE Sensitive     VANCOMYCIN <=0.5 SENSITIVE Sensitive     TRIMETH/SULFA <=10 SENSITIVE Sensitive     CLINDAMYCIN <=0.25 SENSITIVE Sensitive     RIFAMPIN <=0.5 SENSITIVE Sensitive     Inducible Clindamycin NEGATIVE Sensitive      * FEW STAPHYLOCOCCUS AUREUS  Culture, blood (Routine X 2) w Reflex to ID Panel     Status: Abnormal   Collection Time: 12/25/20  8:07 AM   Specimen: BLOOD LEFT HAND  Result Value Ref Range Status   Specimen Description BLOOD LEFT HAND  Final   Special Requests   Final    BOTTLES DRAWN AEROBIC ONLY Blood Culture results may not be optimal due to an inadequate volume of blood received in culture bottles   Culture  Setup Time   Final    GRAM POSITIVE COCCI IN CLUSTERS AEROBIC BOTTLE ONLY    Culture (A)  Final    STAPHYLOCOCCUS EPIDERMIDIS SUSCEPTIBILITIES PERFORMED ON PREVIOUS CULTURE WITHIN THE LAST 5 DAYS. Performed at [facility], [place] [address], [place], [place] [address]    Report Status 12/27/2020 FINAL  Final  Culture, blood (Routine X 2) w Reflex to ID Panel     Status: Abnormal   Collection Time: 12/25/20  8:12 AM   Specimen: BLOOD RIGHT HAND  Result Value Ref Range Status   Specimen Description BLOOD RIGHT HAND  Final   Special Requests   Final    BOTTLES DRAWN AEROBIC ONLY Blood Culture results may not be optimal due to an inadequate volume of blood received in culture bottles   Culture  Setup Time   Final    GRAM POSITIVE COCCI IN CLUSTERS AEROBIC BOTTLE ONLY CRITICAL RESULT CALLED TO, READ BACK BY AND VERIFIED WITH: PHARMD E.WOLFE AT 6962  ON 12/25/2020 BY T.SAAD Performed at [facility], [place] [address] [place], [place] [address]    Culture STAPHYLOCOCCUS EPIDERMIDIS (A)  Final   Report Status 12/27/2020 FINAL  Final   Organism ID, Bacteria STAPHYLOCOCCUS EPIDERMIDIS  Final      Susceptibility   Staphylococcus epidermidis - MIC*    CIPROFLOXACIN >=8 RESISTANT Resistant     ERYTHROMYCIN >=8 RESISTANT Resistant     GENTAMICIN 8 INTERMEDIATE Intermediate     OXACILLIN >=4 RESISTANT Resistant     TETRACYCLINE 2 SENSITIVE Sensitive     VANCOMYCIN 1 SENSITIVE Sensitive     TRIMETH/SULFA 80 RESISTANT Resistant     CLINDAMYCIN >=8 RESISTANT Resistant      RIFAMPIN <=0.5 SENSITIVE Sensitive     Inducible Clindamycin NEGATIVE Sensitive     * STAPHYLOCOCCUS EPIDERMIDIS  Blood Culture ID Panel (Reflexed)     Status: Abnormal   Collection Time: 12/25/20  8:12 AM  Result Value Ref Range Status   Enterococcus faecalis NOT DETECTED NOT DETECTED Final   Enterococcus Faecium NOT DETECTED NOT DETECTED Final   Listeria monocytogenes NOT DETECTED NOT DETECTED Final   Staphylococcus species DETECTED (A) NOT DETECTED Final    Comment: CRITICAL RESULT CALLED TO, READ BACK BY AND VERIFIED WITH: PHARMD E.WOLFE AT 1256 ON 12/25/2020 BY T.SAAD.    Staphylococcus aureus (BCID) NOT DETECTED NOT DETECTED Final   Staphylococcus epidermidis DETECTED (A) NOT DETECTED Final    Comment: Methicillin (oxacillin) resistant coagulase negative staphylococcus. Possible blood culture contaminant (unless isolated from more than one blood culture draw or clinical case suggests pathogenicity). No antibiotic treatment is indicated for blood  culture contaminants.    Staphylococcus lugdunensis NOT DETECTED NOT DETECTED Final   Streptococcus species NOT DETECTED NOT DETECTED Final   Streptococcus agalactiae NOT DETECTED NOT DETECTED Final   Streptococcus pneumoniae NOT DETECTED NOT DETECTED Final   Streptococcus pyogenes NOT DETECTED NOT DETECTED Final   A.calcoaceticus-baumannii NOT DETECTED NOT DETECTED Final   Bacteroides fragilis NOT DETECTED NOT DETECTED Final   Enterobacterales NOT DETECTED NOT DETECTED Final   Enterobacter cloacae complex NOT DETECTED NOT DETECTED Final   Escherichia coli NOT DETECTED NOT DETECTED Final   Klebsiella aerogenes NOT DETECTED NOT DETECTED Final   Klebsiella oxytoca NOT DETECTED NOT DETECTED Final   Klebsiella pneumoniae NOT DETECTED NOT DETECTED Final   Proteus species NOT DETECTED NOT DETECTED Final   Salmonella species NOT DETECTED NOT DETECTED Final   Serratia marcescens NOT DETECTED NOT DETECTED Final   Haemophilus influenzae NOT  DETECTED NOT DETECTED Final   Neisseria meningitidis NOT DETECTED NOT DETECTED Final   Pseudomonas aeruginosa NOT DETECTED NOT DETECTED Final   Stenotrophomonas maltophilia NOT DETECTED NOT DETECTED Final   Candida albicans NOT DETECTED NOT DETECTED Final   Candida auris NOT DETECTED NOT DETECTED Final   Candida glabrata NOT DETECTED NOT DETECTED Final   Candida krusei NOT DETECTED NOT DETECTED Final   Candida parapsilosis NOT DETECTED NOT DETECTED Final   Candida tropicalis NOT DETECTED NOT DETECTED Final   Cryptococcus neoformans/gattii NOT DETECTED NOT DETECTED Final   Methicillin resistance mecA/C DETECTED (A) NOT DETECTED Final    Comment: CRITICAL RESULT CALLED TO, READ BACK BY AND VERIFIED WITH: PHARMD E.WOLFE AT 1256 ON 12/25/2020 BY T.SAAD. Performed at [facility], [place] [address] [place],  [address]   Culture, blood (routine x 2)     Status: None   Collection Time: 12/26/20  5:43 AM   Specimen: BLOOD  Result  Value Ref Range Status   Specimen Description BLOOD RIGHT ANTECUBITAL  Final   Special Requests   Final    BOTTLES DRAWN AEROBIC AND ANAEROBIC Blood Culture adequate volume   Culture   Final    NO GROWTH 5 DAYS Performed at [facility], [address], [place], [place] [address]    Report Status 12/31/2020 FINAL  Final  Culture, blood (routine x 2)     Status: None   Collection Time: 12/26/20  5:43 AM   Specimen: BLOOD RIGHT HAND  Result Value Ref Range Status   Specimen Description BLOOD RIGHT HAND  Final   Special Requests   Final    BOTTLES DRAWN AEROBIC ONLY Blood Culture results may not be optimal due to an inadequate volume of blood received in culture bottles   Culture   Final    NO GROWTH 5 DAYS Performed at [facility], [place] [address] [place], [place] [address]    Report Status 12/31/2020 FINAL  Final  Culture, Urine     Status: None   Collection Time: 01/02/21  4:06 AM   Specimen: Urine, Random  Result Value Ref Range  Status   Specimen Description URINE, RANDOM  Final   Special Requests NONE  Final   Culture   Final    NO GROWTH Performed at [facility], [place] [address], [place], [place] [address]    Report Status 01/03/2021 FINAL  Final  Culture, blood (routine x 2)     Status: Abnormal   Collection Time: 01/02/21  4:19 AM   Specimen: BLOOD LEFT HAND  Result Value Ref Range Status   Specimen Description BLOOD LEFT HAND  Final   Special Requests AEROBIC BOTTLE ONLY Blood Culture adequate volume  Final   Culture  Setup Time   Final    GRAM POSITIVE COCCI IN CLUSTERS AEROBIC BOTTLE ONLY CRITICAL VALUE NOTED.  VALUE IS CONSISTENT WITH PREVIOUSLY REPORTED AND CALLED VALUE.    Culture (A)  Final    STAPHYLOCOCCUS EPIDERMIDIS SUSCEPTIBILITIES PERFORMED ON PREVIOUS CULTURE WITHIN THE LAST 5 DAYS. Performed at [facility], [place] [address], [place], [place] [address]    Report Status 01/05/2021 FINAL  Final  Culture, blood (routine x 2)     Status: Abnormal   Collection Time: 01/02/21  4:32 AM   Specimen: BLOOD LEFT HAND  Result Value Ref Range Status   Specimen Description BLOOD LEFT HAND  Final   Special Requests   Final    BOTTLES DRAWN AEROBIC AND ANAEROBIC Blood Culture results may not be optimal due to an inadequate volume of blood received in culture bottles   Culture  Setup Time   Final    GRAM POSITIVE COCCI ANAEROBIC BOTTLE ONLY CRITICAL RESULT CALLED TO, READ BACK BY AND VERIFIED WITH: PHARMD GREG ABBOTT 01/03/2021 AT 0630 A.HUGHES Performed at [facility], [place] [address], [place], [place] [address]    Culture STAPHYLOCOCCUS EPIDERMIDIS (A)  Final   Report Status 01/05/2021 FINAL  Final   Organism ID, Bacteria STAPHYLOCOCCUS EPIDERMIDIS  Final      Susceptibility   Staphylococcus epidermidis - MIC*    CIPROFLOXACIN >=8 RESISTANT Resistant     ERYTHROMYCIN >=8 RESISTANT Resistant     GENTAMICIN >=16 RESISTANT Resistant     OXACILLIN >=4 RESISTANT Resistant      TETRACYCLINE 2 SENSITIVE Sensitive     VANCOMYCIN 1 SENSITIVE Sensitive     TRIMETH/SULFA 80 RESISTANT Resistant  CLINDAMYCIN >=8 RESISTANT Resistant     RIFAMPIN <=0.5 SENSITIVE Sensitive     Inducible Clindamycin NEGATIVE Sensitive     * STAPHYLOCOCCUS EPIDERMIDIS  Blood Culture ID Panel (Reflexed)     Status: Abnormal   Collection Time: 01/02/21  4:32 AM  Result Value Ref Range Status   Enterococcus faecalis NOT DETECTED NOT DETECTED Final   Enterococcus Faecium NOT DETECTED NOT DETECTED Final   Listeria monocytogenes NOT DETECTED NOT DETECTED Final   Staphylococcus species DETECTED (A) NOT DETECTED Final    Comment: CRITICAL RESULT CALLED TO, READ BACK BY AND VERIFIED WITH: PHARMD GREG ABBOTT 01/03/2021 AT 0630 A.HUGHES    Staphylococcus aureus (BCID) NOT DETECTED NOT DETECTED Final   Staphylococcus epidermidis DETECTED (A) NOT DETECTED Final    Comment: Methicillin (oxacillin) resistant coagulase negative staphylococcus. Possible blood culture contaminant (unless isolated from more than one blood culture draw or clinical case suggests pathogenicity). No antibiotic treatment is indicated for blood  culture contaminants. CRITICAL RESULT CALLED TO, READ BACK BY AND VERIFIED WITH: PHARMD GREG ABBOTT 01/03/2021 AT 0630 A.HUGHES    Staphylococcus lugdunensis NOT DETECTED NOT DETECTED Final   Streptococcus species NOT DETECTED NOT DETECTED Final   Streptococcus agalactiae NOT DETECTED NOT DETECTED Final   Streptococcus pneumoniae NOT DETECTED NOT DETECTED Final   Streptococcus pyogenes NOT DETECTED NOT DETECTED Final   A.calcoaceticus-baumannii NOT DETECTED NOT DETECTED Final   Bacteroides fragilis NOT DETECTED NOT DETECTED Final   Enterobacterales NOT DETECTED NOT DETECTED Final   Enterobacter cloacae complex NOT DETECTED NOT DETECTED Final   Escherichia coli NOT DETECTED NOT DETECTED Final   Klebsiella aerogenes NOT DETECTED NOT DETECTED Final   Klebsiella oxytoca NOT DETECTED  NOT DETECTED Final   Klebsiella pneumoniae NOT DETECTED NOT DETECTED Final   Proteus species NOT DETECTED NOT DETECTED Final   Salmonella species NOT DETECTED NOT DETECTED Final   Serratia marcescens NOT DETECTED NOT DETECTED Final   Haemophilus influenzae NOT DETECTED NOT DETECTED Final   Neisseria meningitidis NOT DETECTED NOT DETECTED Final   Pseudomonas aeruginosa NOT DETECTED NOT DETECTED Final   Stenotrophomonas maltophilia NOT DETECTED NOT DETECTED Final   Candida albicans NOT DETECTED NOT DETECTED Final   Candida auris NOT DETECTED NOT DETECTED Final   Candida glabrata NOT DETECTED NOT DETECTED Final   Candida krusei NOT DETECTED NOT DETECTED Final   Candida parapsilosis NOT DETECTED NOT DETECTED Final   Candida tropicalis NOT DETECTED NOT DETECTED Final   Cryptococcus neoformans/gattii NOT DETECTED NOT DETECTED Final   Methicillin resistance mecA/C DETECTED (A) NOT DETECTED Final    Comment: CRITICAL RESULT CALLED TO, READ BACK BY AND VERIFIED WITH: PHARMD GREG ABBOTT 01/03/2021 AT 0630 A.HUGHES Performed at [facility], [place] [address] [place], [place] [address]   Culture, blood (Routine X 2) w Reflex to ID Panel     Status: Abnormal (Preliminary result)   Collection Time: 01/03/21 11:31 AM   Specimen: BLOOD LEFT HAND  Result Value Ref Range Status   Specimen Description BLOOD LEFT HAND  Final   Special Requests   Final    BOTTLES DRAWN AEROBIC ONLY Blood Culture adequate volume   Culture  Setup Time   Final    GRAM NEGATIVE RODS AEROBIC BOTTLE ONLY PHARMD GREG ABBOTT 01/04/2021 AT 0425 A.HUGHES CRITICAL VALUE NOTED.  VALUE IS CONSISTENT WITH PREVIOUSLY REPORTED AND CALLED VALUE. Performed at [facility], [place] [address] [place], [place] [address]    Culture ESCHERICHIA COLI (A)  Final  Report Status PENDING  Incomplete  Culture, blood (Routine X 2) w Reflex to ID Panel     Status: Abnormal (Preliminary result)   Collection Time: 01/03/21 11:31 AM    Specimen: BLOOD LEFT FOREARM  Result Value Ref Range Status   Specimen Description BLOOD LEFT FOREARM  Final   Special Requests   Final    BOTTLES DRAWN AEROBIC ONLY Blood Culture adequate volume   Culture  Setup Time   Final    GRAM NEGATIVE RODS AEROBIC BOTTLE ONLY CRITICAL RESULT CALLED TO, READ BACK BY AND VERIFIED WITH: PHARMD GREG ABBOTT 01/04/2021 AT 0425 A.HUGHES    Culture (A)  Final    ESCHERICHIA COLI SUSCEPTIBILITIES TO FOLLOW Performed at [facility], [place] [address] [place], [place] [address]    Report Status PENDING  Incomplete  Blood Culture ID Panel (Reflexed)     Status: Abnormal   Collection Time: 01/03/21 11:31 AM  Result Value Ref Range Status   Enterococcus faecalis NOT DETECTED NOT DETECTED Final   Enterococcus Faecium NOT DETECTED NOT DETECTED Final   Listeria monocytogenes NOT DETECTED NOT DETECTED Final   Staphylococcus species NOT DETECTED NOT DETECTED Final   Staphylococcus aureus (BCID) NOT DETECTED NOT DETECTED Final   Staphylococcus epidermidis NOT DETECTED NOT DETECTED Final   Staphylococcus lugdunensis NOT DETECTED NOT DETECTED Final   Streptococcus species NOT DETECTED NOT DETECTED Final   Streptococcus agalactiae NOT DETECTED NOT DETECTED Final   Streptococcus pneumoniae NOT DETECTED NOT DETECTED Final   Streptococcus pyogenes NOT DETECTED NOT DETECTED Final   A.calcoaceticus-baumannii NOT DETECTED NOT DETECTED Final   Bacteroides fragilis NOT DETECTED NOT DETECTED Final   Enterobacterales DETECTED (A) NOT DETECTED Final    Comment: Enterobacterales represent a large order of gram negative bacteria, not a single organism. CRITICAL RESULT CALLED TO, READ BACK BY AND VERIFIED WITH: PHARMD GREG ABBOTT 01/04/2021 AT 0425 A.HUGHES    Enterobacter cloacae complex NOT DETECTED NOT DETECTED Final   Escherichia coli DETECTED (A) NOT DETECTED Final    Comment: CRITICAL RESULT CALLED TO, READ BACK BY AND VERIFIED WITH: PHARMD GREG ABBOTT  01/04/2021 AT 0425 A.HUGHES    Klebsiella aerogenes NOT DETECTED NOT DETECTED Final   Klebsiella oxytoca NOT DETECTED NOT DETECTED Final   Klebsiella pneumoniae NOT DETECTED NOT DETECTED Final   Proteus species NOT DETECTED NOT DETECTED Final   Salmonella species NOT DETECTED NOT DETECTED Final   Serratia marcescens NOT DETECTED NOT DETECTED Final   Haemophilus influenzae NOT DETECTED NOT DETECTED Final   Neisseria meningitidis NOT DETECTED NOT DETECTED Final   Pseudomonas aeruginosa NOT DETECTED NOT DETECTED Final   Stenotrophomonas maltophilia NOT DETECTED NOT DETECTED Final   Candida albicans NOT DETECTED NOT DETECTED Final   Candida auris NOT DETECTED NOT DETECTED Final   Candida glabrata NOT DETECTED NOT DETECTED Final   Candida krusei NOT DETECTED NOT DETECTED Final   Candida parapsilosis NOT DETECTED NOT DETECTED Final   Candida tropicalis NOT DETECTED NOT DETECTED Final   Cryptococcus neoformans/gattii NOT DETECTED NOT DETECTED Final   CTX-M ESBL NOT DETECTED NOT DETECTED Final   Carbapenem resistance IMP NOT DETECTED NOT DETECTED Final   Carbapenem resistance KPC NOT DETECTED NOT DETECTED Final   Carbapenem resistance NDM NOT DETECTED NOT DETECTED Final   Carbapenem resist OXA 48 LIKE NOT DETECTED NOT DETECTED Final   Carbapenem resistance VIM NOT DETECTED NOT DETECTED Final    Comment: Performed at [facility], [address] [place], [place] [address]  Pertinent Lab. CBC Latest Ref Rng & Units 01/05/2021 01/04/2021 01/03/2021  WBC 4.0 - 10.5 K/uL 11.7(H) 11.7(H) 10.8(H)  Hemoglobin 13.0 - 17.0 g/dL 9.7(L) 9.5(L) 10.0(L)  Hematocrit 39.0 - 52.0 % 30.5(L) 29.9(L) 31.6(L)  Platelets 150 - 400 K/uL 186 190 175   CMP Latest Ref Rng & Units 01/05/2021 01/04/2021 01/03/2021  Glucose 70 - 99 mg/dL 136(H) 106(H) 195(H)  BUN 6 - 20 mg/dL 56(H) 57(H) 54(H)  Creatinine 0.61 - 1.24 mg/dL 1.03 0.99 1.05  Sodium 135 - 145 mmol/L 146(H) 144 143  Potassium 3.5 - 5.1 mmol/L 3.7  3.4(L) 4.1  Chloride 98 - 111 mmol/L 111 108 108  CO2 22 - 32 mmol/L _0 Calcium 8.9 - 10.3 mg/dL 8.7(L) 8.7(L) 8.9  Total Protein 6.5 - 8.1 g/dL - - -  Total Bilirubin 0.3 - 1.2 mg/dL - - -  Alkaline Phos 38 - 126 U/L - - -  AST 15 - 41 U/L - - -  ALT 0 - 44 U/L - - -     Pertinent Imaging today Plain films and CT images have been personally visualized and interpreted; radiology reports have been reviewed. Decision making incorporated into the Impression / Recommendations.  TTE 01/04/21 1. Mild intracavitary gradient. Peak velocity 1.93 m/s. Peak gradient 15 mmHg. Left ventricular ejection fraction, by estimation, is 60 to 65%. The left ventricle has normal function. The left ventricle has no regional wall motion abnormalities. 2. Right ventricular systolic function is normal. The right ventricular size is normal. There is normal pulmonary artery systolic pressure. 3. The mitral valve is normal in structure. No evidence of mitral valve regurgitation. No evidence of mitral stenosis. 4. The aortic valve is normal in structure. There is mild calcification of the aortic valve. No aortic stenosis is present. 5. The inferior vena cava is normal in size with greater than 50% respiratory variability, suggesting right atrial pressure of 3 mmHg.  Venous Duplex UE 01/04/21 Summary: Bilateral: Study was technically limited as described above. No obvious evidence of DVT or superficial vein thrombosis involving visualized veins of bilateral upper extremities. When compared to prior study, previous DVT in left IJV and subclavian veins appear to have resolved.  US duplex LE 4/25 Summary:  RIGHT:  - There is no evidence of deep vein thrombosis in the lower extremity.  However, portions of this examination were limited- see technologist  comments above. 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    - No cystic structure  found in the popliteal fossa.    LEFT:  - There is no evidence of deep vein thrombosis in the lower extremity.  However, portions of this examination were limited- see technologist  comments above.    - No cystic structure found in the popliteal fossa.  I have spent approx 30 minutes for this patient encounter including review of prior medical records, coordination of care  with greater than 50% of time being face to face/counseling and discussing diagnostics/treatment plan with the patient/family.  Electronically signed by:   Rosiland Oz, MD Infectious Disease Physician [facility] Pager: [phone]

## 2021-01-05 NOTE — Plan of Care (Signed)
  Problem: Education: Goal: Knowledge of General Education information will improve Description: Including pain rating scale, medication(s)/side effects and non-pharmacologic comfort measures Outcome: Not Progressing   Problem: Health Behavior/Discharge Planning: Goal: Ability to manage health-related needs will improve Outcome: Not Progressing   Problem: Clinical Measurements: Goal: Ability to maintain clinical measurements within normal limits will improve Outcome: Not Progressing Goal: Will remain free from infection Outcome: Not Progressing Goal: Diagnostic test results will improve Outcome: Not Progressing Goal: Respiratory complications will improve Outcome: Not Progressing Goal: Cardiovascular complication will be avoided Outcome: Not Progressing   Problem: Activity: Goal: Risk for activity intolerance will decrease Outcome: Not Progressing   Problem: Nutrition: Goal: Adequate nutrition will be maintained Outcome: Not Progressing   Problem: Coping: Goal: Level of anxiety will decrease Outcome: Not Progressing   Problem: Elimination: Goal: Will not experience complications related to bowel motility Outcome: Not Progressing Goal: Will not experience complications related to urinary retention Outcome: Not Progressing   Problem: Pain Managment: Goal: General experience of comfort will improve Outcome: Not Progressing   Problem: Safety: Goal: Ability to remain free from injury will improve Outcome: Not Progressing   Problem: Skin Integrity: Goal: Risk for impaired skin integrity will decrease Outcome: Not Progressing   Problem: Safety: Goal: Non-violent Restraint(s) Outcome: Not Progressing   Problem: Education: Goal: Knowledge of disease or condition will improve Outcome: Not Progressing Goal: Knowledge of secondary prevention will improve Outcome: Not Progressing Goal: Knowledge of patient specific risk factors addressed and post discharge goals  established will improve Outcome: Not Progressing   Problem: Self-Care: Goal: Verbalization of feelings and concerns over difficulty with self-care will improve Outcome: Not Progressing   Problem: Nutrition: Goal: Dietary intake will improve Outcome: Not Progressing

## 2021-01-05 NOTE — Consult Note (Signed)
WOC Nurse wound follow up Patient receiving care in [facility] 2W19. Charting by PT for hydrotherapy sessions reviewed. Continue the current POC. Helmut Muster, RN, MSN, CWOCN, CNS-BC, pager [phone]

## 2021-01-05 NOTE — Progress Notes (Signed)
Physical Therapy Treatment Patient Details Name: [name] MRN: [id number] DOB: [birth year] Today's Date: 01/05/2021    History of Present Illness 60 year old white male who presented to the emergency room from home 12/10/20.  Pt had 4-5 days of N/V/D. Pt became more confused and at times belligerent. Admitted for hypoxemic respiratory failure and septic shock 2/2 E. Coli bacteremia and UTI and was intubated. Extubated 4/16, now on cortrak    PT Comments    Patient drowsy/lethargic yet when aroused by stretching of bil knees, he would become restless and resist PROM into knee extension. Donned boots for positioning into neutral hip rotation with good results on RLE however pt overpowering boot and lying LLE in external rotation with knee flexion. Patient may be candidate for verticalization bed to safely work on standing. Will discuss with wound care team if they feel bed provides adequate pressure relief.     Follow Up Recommendations  LTACH     Equipment Recommendations  None recommended by PT    Recommendations for Other Services       Precautions / Restrictions Precautions Precautions: Fall;Other (comment) Precaution Comments: cortrak, condom catheter    Mobility  Bed Mobility               General bed mobility comments: 2 person total assist to scoot to [facility]; initiated chair position in bed with air mattress and pt grimacing and trying to move off his rt buttock as being moved into sitting position; reversed bed and pt able to tolerate ~45 HOB elevated    Transfers                 General transfer comment: unable to stand  Ambulation/Gait             General Gait Details: unable   Stairs             Wheelchair Mobility    Modified Rankin (Stroke Patients Only)       Balance                                            Cognition Arousal/Alertness: Lethargic;Suspect due to medications Behavior During Therapy:  Restless Overall Cognitive Status: Difficult to assess                                 General Comments: no attempts to verbalize      Exercises Other Exercises Other Exercises: PROM bil hips internal rotation (to neutral only) with 30 sec hold x 2 each; knee extension bil LEs with PROM x 10 each leg with nearly full extension achieved (difficult to discern if contractures due to pt resists attempts) Other Exercises: RUE shoulder flexion, elbow flexion, extension, finger flexion/extension all AAROM to PROM due to lethargy    General Comments General comments (skin integrity, edema, etc.): despite lethargy and frequent eye-closing, pt restlessly moving his legs and resisting attempts at extending his knees      Pertinent Vitals/Pain Pain Assessment: Faces Faces Pain Scale: Hurts even more Pain Location: rt buttock Pain Descriptors / Indicators: Restless;Grimacing Pain Intervention(s): Repositioned;Limited activity within patient's tolerance;Monitored during session    Home Living                      Prior Function  PT Goals (current goals can now be found in the care plan section) Acute Rehab PT Goals Patient Stated Goal: unable, but does some yes no nodding PT Goal Formulation: Patient unable to participate in goal setting Time For Goal Achievement: 01/15/21 Potential to Achieve Goals: Fair Progress towards PT goals: Not progressing toward goals - comment (lethargic)    Frequency    Min 2X/week      PT Plan Current plan remains appropriate    Co-evaluation              AM-PAC PT "6 Clicks" Mobility   Outcome Measure  Help needed turning from your back to your side while in a flat bed without using bedrails?: A Lot Help needed moving from lying on your back to sitting on the side of a flat bed without using bedrails?: A Lot Help needed moving to and from a bed to a chair (including a wheelchair)?: Total Help needed standing  up from a chair using your arms (e.g., wheelchair or bedside chair)?: Total Help needed to walk in hospital room?: Total Help needed climbing 3-5 steps with a railing? : Total 6 Click Score: 8    End of Session   Activity Tolerance: Patient limited by lethargy Patient left: in bed;with bed alarm set;with nursing/sitter in room;with restraints reapplied (rt wrist) Nurse Communication: Mobility status PT Visit Diagnosis: Muscle weakness (generalized) (M62.81);Difficulty in walking, not elsewhere classified (R26.2);Other abnormalities of gait and mobility (R26.89)     Time: 3086-5784 PT Time Calculation (min) (ACUTE ONLY): 31 min  Charges:  $Therapeutic Exercise: 23-37 mins                      Jerolyn Center, PT Pager [phone]    Zena Amos 01/05/2021, 5:10 PM

## 2021-01-05 NOTE — Progress Notes (Addendum)
PROGRESS NOTE    [name]  ZOX:[id number] DOB: 24-Aug-1961 DOA: 12/10/2020 PCP: Pcp, No   Chief Complaint  Patient presents with  . Altered Mental Status  Brief Narrative: 60 YO male w/ hypertension, hyperlipidemia presented to the ED 3/31 from home with for 5 days history of diarrhea nausea vomiting and confusion.  Patient was admitted for acute hypoxic aspiratory failure with septic shock due to E. coli bacteremia and UTI and ATN needing intubation pressor support sedation with Precedex/Versed. Patient had prolonged hospitalization complicated course (see previous ICU/transfer note) was managed by critical care, infectious disease for E. coli pyelonephritis complicated by hydronephrosis requiring placement of a stent and bacteremia then MSSA pneumonia,   MRSE endovascular infection, acute hypoxic/hypercapnic respiratory failure/acute toxic metabolic encephalopathy, AKI, left IJ/subclavian DVT versus septic thrombus-managed with Lovenox and ongoing fever/tachycardia.. Patient transferred to [facility] on 01/01/[facility]21-with ongoing confusion/altered mental status and mentation slowly improving, ongoing tachycardia, fevers episodes Cortrack NG feeding, PICC line.  Subjective:  Seen and examined this morning. Fever spike last night at 11 PM 102.4 Patient appears alert awake able to follow some commands weakness in the left upper extremity otherwise moving all extremities, right arm on restraint. Complaints of pain in the left, overnight some agitation needing fentanyl trazodone.  Assessment & Plan:  MRSE endovascular infection/currently staph epidermidis bacteremia/gram-negative rods bacteremia on blood cultures 4/24: Appreciate ID input on board.Intermittently febrile, tachycardic tachypneic due to ongoing bacteremia. Vanco trough and peak level subtherapeutic 4/24.due to persistent bacteremia PICC line has been removed -4/24, repeat blood culture sent 4/24- and ceftriaxone added. Duplex UE/LE ordered  again 4/25-no  thrombosis in bilateral upper extremities and DVT in the left IJ and subclavian vein appears to have resolved compared to previous study, no DVT in lower extremities, evidence of acute superficial vein thrombosis from varicose vein of the medial right calf.  TTT EF 60 to 65% no valvular abnormalities.  Continue vancomycin, Rocephin as per ID.  With ongoing fever episode repeat blood culture/urine culture, hepatitis serologies sent 4/26, may need TEE (will need to consult cardio-we can secure chat [facility] Hews for TEE). CT abdomen pelvis is pending.  Tachycardia-mostly from fever but also contributed by negative balance dehydration he has been getting as needed IV fluids.  Currently on tube feeding 75 cc/h plus free water 200 mL every 4 hours, overall heart rate appears improving.   Septic shock multifactorial 2/2 E. coli pyelonephritis, MSSA pneumonia, MRSE bacteremia with left IJ/[place] septic thrombophlebitis.  Off pressors.  Having intermittent fever issues and persistent bacteremia see #1.   Acute hypoxic/hypercapnic respiratory failure with MSSA pneumonia: Initially intubated, extubated 4/16.  PCCM recommends BiPAP at bedtime has been needing high flow nasal cannula at 8 L-he was able to come off oxygen 4/24.  PCCM reviewed the chart and signed off 4/25.  Continue pulmonary support.   AKI in the setting of sepsis/ATN/pyelonephritis with hydronephrosis status post left ureteral stent 4/6, repeat ultrasound showed mild left hydronephrosis.  It has resolved since.  Continue tube feeding. He has a Foley in place due to sacral wound. 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  Able to  verbalize more follow commands.  Right wrist to remain on restraint , and has weakness in  left upper arm.  Able to move bilateral lower legs.  Continue supportive measures.  On aspirin 81, Lipitor.  Lipid panel 4/12 showed HDL 30 and LDL 83.  Due to ongoing altered mental status agitation CT head repeated this morning and no acute finding. 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  Essential hypertension BP well controlled -switched amlodipine to Coreg-persistent tachycardia, monitor and adjust Coreg.    Diabetes mellitus type II on glipizide prior to admission him will HBA1c 6.8.blood sugar is controlled on Lantus 35 u bid, novolog 4 units and q4ssi while on tube feeding Recent Labs  Lab 01/04/21 1925 01/04/21 2254 01/05/21 0255 01/05/21 0745 01/05/21 1124  GLUCAP 141* 128* 112* 149* 121*   Hypernatremia: Resolved.  Continue free water through core track  Recent Labs  Lab 01/01/21 0230 01/02/21 0020 01/03/21 0243 01/04/21 0247 01/05/21 0622  NA 146* 145 143 144 146*   Left IJ/subclavian DVT versus septic thrombus: Appears resolved on repeat duplex 4/25. Pt remaisn on SQ Lovenox therapeutic dose by PCCM team. PICC line has been removed 4/24 RUE.  Chronic microcytic anemia hemoglobin is stable transfuse if less than 7 g  QTC prolonged:Avoid QT prolonging medication maintain electrolytes mag above 2 potassium above 4 and monitor.  Abdominal aortic aneurysm 4.8 cm advised 6 months followed by radiology  Morbid obesity with BMI 35.8.  Would benefit weight loss.  Patient has a deep tissue injury sacral wound on hydrotherapy  Diet Order            DIET - DYS 1 Room service appropriate? No; Fluid consistency: Honey Thick  Diet effective now                 Nutrition Problem: Increased nutrient needs Etiology: wound healing Signs/Symptoms: estimated needs Interventions: Tube  feeding Patient's Body mass index is 34.14 kg/m.  Pressure Injury 12/21/20 Buttocks Right Deep Tissue Pressure Injury - Purple or maroon localized area of discolored intact skin or blood-filled blister due to damage of underlying soft tissue from pressure and/or shear. purple with blister area that has  (Active)  12/21/20 1000  Location: Buttocks  Location Orientation: Right  Staging: Deep Tissue Pressure Injury - Purple or maroon localized area of discolored intact skin or blood-filled blister due to damage of underlying soft tissue from pressure and/or shear.  Wound Description (Comments): purple with blister area that has torn  Present on Admission: No     Pressure Injury 12/21/20 Foot Left;Lateral Deep Tissue Pressure Injury - Purple or maroon localized area of discolored intact skin or blood-filled blister due to damage of underlying soft tissue from pressure and/or shear. .5 cm purple area (Active)  12/21/20 0800  Location: Foot  Location Orientation: Left;Lateral  Staging: Deep Tissue Pressure Injury - Purple or maroon localized area of discolored intact skin or blood-filled blister due to damage of underlying soft tissue from pressure and/or shear.  Wound Description (Comments): .5 cm purple area  Present on Admission: No     Pressure Injury 01/04/21 Buttocks Posterior;Right;Mid Stage 3 -  Full thickness tissue loss. Subcutaneous fat may be visible but bone, tendon or muscle are NOT exposed. medial buttocks where rectal tube laid. (tube removed) (Active)  01/04/21 0800  Location: Buttocks  Location Orientation: Posterior;Right;Mid  Staging: Stage 3 -  Full thickness tissue loss. Subcutaneous fat may be visible but bone, tendon or muscle are NOT exposed.  Wound Description (Comments):  medial buttocks where rectal tube laid. (tube removed)  Present on Admission: No   DVT prophylaxis: SCDs Start: 12/10/20 0737 Lovenox Code Status:   Code Status: Full Code  Family Communication:  plan of care discussed with patient at bedside. Called his Spouse's phone no but no answer 4/23 Prognosis is guarded, wife was updated extensively previously. unable to reach w/ phone today  Again called his wife-no answer- but she called back and I was abel to update her again= prognosis remaisn to be seen. She understands his current clinical situation. Cont to monitor-at risk of decompensation  Status ZO:[id number] Remains inpatient appropriate because:Altered mental status, IV treatments appropriate due to intensity of illness or inability to take PO and Inpatient level of care appropriate due to severity of illness  Dispo: The patient is from: Home              Anticipated d/c is to: tbd              Patient currently is not medically stable to d/c.   Difficult to place patient No Unresulted Labs (From admission, onward)          Start     Ordered   01/06/21 0500  CK  Weekly,   R     Question:  Specimen collection method  Answer:  Lab=Lab collect   01/05/21 0837   01/06/21 0500  Basic metabolic panel  Daily,   R     Question:  Specimen collection method  Answer:  Lab=Lab collect   01/05/21 0901   01/06/21 0500  CBC  Daily,   R     Question:  Specimen collection method  Answer:  Lab=Lab collect   01/05/21 0901   01/05/21 0738  Culture, Urine  Once,   R        01/05/21 0737   01/05/21 0500  Culture, blood (routine x 2)  BLOOD CULTURE X 2,   R (with TIMED occurrences)      01/04/21 1328   01/05/21 0500  Hepatitis B surface antibody,quantitative  Once,   R        01/05/21 0500   12/31/20 1051  Culture, Respiratory w Gram Stain  Once,   R        12/31/20 1050          Medications reviewed:  Scheduled Meds: . aspirin  81 mg Per Tube Daily  . atorvastatin  40 mg Per Tube Daily  . carvedilol  3.125 mg Oral BID WC  . chlorhexidine  15 mL Mouth Rinse BID  . Chlorhexidine Gluconate Cloth  6 each Topical Daily  . collagenase   Topical Daily  . docusate  50 mg Per Tube Daily  .  enoxaparin (LOVENOX) injection  120 mg Subcutaneous Q12H  . feeding supplement (PROSource TF)  90 mL Per Tube QID  . free water  200 mL Per Tube Q4H  . insulin aspart  0-20 Units Subcutaneous Q4H  . insulin aspart  8 Units Subcutaneous Q4H  . insulin detemir  35 Units Subcutaneous BID  . mouth rinse  15 mL Mouth Rinse q12n4p  . nutrition supplement (JUVEN)  1 packet Per Tube BID BM  . oxyCODONE  5 mg Per Tube Q6H  . polyethylene glycol  17 g Per Tube Daily  . QUEtiapine  25 mg Oral QHS  . sodium chloride flush  10-40 mL Intracatheter Q12H  . sodium chloride flush  10-40 mL Intracatheter Q12H  . vitamin B-12  100 mcg Per Tube Daily   Continuous Infusions: . sodium chloride Stopped (12/31/20 1020)  . cefTRIAXone (ROCEPHIN)  IV 2 g (01/05/21 1610)  . DAPTOmycin (CUBICIN)  IV    . feeding supplement (OSMOLITE 1.2 CAL) 1,000 mL (01/05/21 0243)    Consultants:see note  Procedures:see note  Antimicrobials: Anti-infectives (From admission, onward)   Start     Dose/Rate Route Frequency Ordered Stop   01/05/21 1700  DAPTOmycin (CUBICIN) 900 mg in sodium chloride 0.9 % IVPB  Status:  Discontinued        9 mg/kg  97.6 kg (Adjusted) 136 mL/hr over 30 Minutes Intravenous Daily 01/05/21 0837 01/05/21 0845   01/05/21 1500  DAPTOmycin (CUBICIN) 900 mg in sodium chloride 0.9 % IVPB        9 mg/kg  97.6 kg (Adjusted) 136 mL/hr over 30 Minutes Intravenous Daily 01/05/21 0845     01/05/21 0800  cefTRIAXone (ROCEPHIN) 2 g in sodium chloride 0.9 % 100 mL IVPB        2 g 200 mL/hr over 30 Minutes Intravenous Every 24 hours 01/04/21 0436     01/04/21 0530  cefTRIAXone (ROCEPHIN) 2 g in sodium chloride 0.9 % 100 mL IVPB        2 g 200 mL/hr over 30 Minutes Intravenous  Once 01/04/21 0436 01/04/21 0709   01/04/21 0500  vancomycin (VANCOREADY) IVPB 1000 mg/200 mL  Status:  Discontinued        1,000 mg 200 mL/hr over 60 Minutes Intravenous Every 12 hours 01/03/21 2216 01/05/21 0837   01/01/21 0600   vancomycin (VANCOREADY) IVPB 500 mg/100 mL  Status:  Discontinued        500 mg 100 mL/hr over 60 Minutes Intravenous Every 12 hours 12/31/20 1036 01/03/21 2216   12/29/20 0600  vancomycin (VANCOREADY) IVPB 1000 mg/200 mL  Status:  Discontinued        1,000 mg 200 mL/hr over 60 Minutes Intravenous Every 24 hours 12/28/20 0713 12/31/20 1036   12/25/20 0600  vancomycin (VANCOREADY) IVPB 1500 mg/300 mL  Status:  Discontinued        1,500 mg 150 mL/hr over 120 Minutes Intravenous Every 24 hours 12/24/20 1840 12/28/20 0713   12/24/20 1915  vancomycin (VANCOCIN) 2,500 mg in sodium chloride 0.9 % 500 mL IVPB        2,500 mg 250 mL/hr over 120 Minutes Intravenous  Once 12/24/20 1820 12/24/20 2147   12/24/20 1000  cefTRIAXone (ROCEPHIN) 2 g in sodium chloride 0.9 % 100 mL IVPB  Status:  Discontinued        2 g 200 mL/hr over 30 Minutes Intravenous Every 24 hours 12/24/20 0856 12/25/20 1607   12/17/20 2200  cefTRIAXone (ROCEPHIN) 2 g in sodium chloride 0.9 % 100 mL IVPB        2 g 200 mL/hr over 30 Minutes Intravenous Every 24 hours 12/17/20 1123 12/21/20 2135   12/16/20 1100  ceFEPIme (MAXIPIME) 2 g in sodium chloride 0.9 % 100 mL IVPB  Status:  Discontinued        2 g 200 mL/hr over 30 Minutes Intravenous Every 12 hours 12/16/20 0946 12/17/20 1123   12/14/20 1400  ceFAZolin (ANCEF) IVPB 2g/100 mL premix  Status:  Discontinued        2 g 200 mL/hr over 30 Minutes Intravenous Every 8 hours 12/14/20 1223 12/16/20 0946   12/10/20 1530  cefTRIAXone (ROCEPHIN) 2 g in sodium chloride 0.9 % 100 mL IVPB  Status:  Discontinued  2 g 200 mL/hr over 30 Minutes Intravenous Every 24 hours 12/10/20 1436 12/14/20 1223   12/10/20 1200  piperacillin-tazobactam (ZOSYN) IVPB 2.25 g  Status:  Discontinued        2.25 g 100 mL/hr over 30 Minutes Intravenous Every 6 hours 12/10/20 0740 12/10/20 1436   12/10/20 0600  vancomycin (VANCOREADY) IVPB 1500 mg/300 mL        1,500 mg 150 mL/hr over 120 Minutes  Intravenous STAT 12/10/20 0546 12/10/20 0821   12/10/20 0400  vancomycin (VANCOREADY) IVPB 1000 mg/200 mL        1,000 mg 200 mL/hr over 60 Minutes Intravenous  Once 12/10/20 0350 12/10/20 0520   12/10/20 0400  piperacillin-tazobactam (ZOSYN) IVPB 3.375 g        3.375 g 12.5 mL/hr over 240 Minutes Intravenous  Once 12/10/20 0350 12/10/20 0450     Culture/Microbiology    Component Value Date/Time   SDES BLOOD LEFT HAND 01/03/2021 1131   SDES BLOOD LEFT FOREARM 01/03/2021 1131   SPECREQUEST  01/03/2021 1131    BOTTLES DRAWN AEROBIC ONLY Blood Culture adequate volume   SPECREQUEST  01/03/2021 1131    BOTTLES DRAWN AEROBIC ONLY Blood Culture adequate volume   CULT ESCHERICHIA COLI (A) 01/03/2021 1131   CULT (A) 01/03/2021 1131    ESCHERICHIA COLI SUSCEPTIBILITIES TO FOLLOW Performed at [facility], [address], [place], [place] [address]    REPTSTATUS PENDING 01/03/2021 1131   REPTSTATUS PENDING 01/03/2021 1131    Other culture-see note  Objective: Vitals: Today's Vitals   01/05/21 0800 01/05/21 0817 01/05/21 0854 01/05/21 1100  BP: 134/72  (!) 145/71 139/66  Pulse: (!) 111  (!) 110 (!) 113  Resp: (!) 24  20 20   Temp: 99.8 F (37.7 C)   99.6 F (37.6 C)  TempSrc: Oral   Oral  SpO2: 95%  97% 95%  Weight:      Height:      PainSc:  3       Intake/Output Summary (Last 24 hours) at 01/05/2021 1309 Last data filed at 01/05/2021 1200 Gross per 24 hour  Intake 3000 ml  Output 3000 ml  Net 0 ml   Filed Weights   12/28/20 0345 12/29/20 0400 01/03/21 0500  Weight: 127.8 kg 126.7 kg 120.6 kg   Weight change:   Intake/Output from previous day: 04/25 0701 - 04/26 0700 In: 6507.5 [NG/GT:6507.5] Out: 3400 [Urine:3400] Intake/Output this shift: Total I/O In: 775 [NG/GT:775] Out: 525 [Urine:525] Filed Weights   12/28/20 0345 12/29/20 0400 01/03/21 0500  Weight: 127.8 kg 126.7 kg 120.6 kg    Examination: General exam: Mildly lethargic but follow commands  unable to vocalize, weak appearing. HEENT:Oral mucosa moist, Ear/Nose WNL grossly, dentition normal. Respiratory system: bilaterally clear, no wheezing or crackles,no use of accessory muscle Cardiovascular system: S1 & S2 +, No JVD. Gastrointestinal system: Abdomen soft, NT,ND, BS+ Nervous System:Alert, awake, LUE weak,moving extremities and grossly nonfocal Extremities: No edema, distal peripheral pulses palpable.  Skin: No rashes,no icterus. MSK: Normal muscle bulk,tone, power    Data Reviewed: I have personally reviewed following labs and imaging studies CBC: Recent Labs  Lab 12/31/20 0525 01/01/21 0230 01/02/21 0020 01/03/21 0243 01/04/21 0247 01/05/21 0800  WBC 10.2  10.1 9.9 9.3 10.8* 11.7* 11.7*  NEUTROABS 7.6  --   --   --   --  9.6*  HGB 9.7*  9.5* 8.9* 8.9* 10.0* 9.5* 9.7*  HCT 31.9*  32.1* 28.5* 28.6* 31.6* 29.9* 30.5*  MCV 104.2*  104.2* 101.4* 101.1* 99.7 98.7 98.4  PLT 216  208 180 173 175 190 186   Basic Metabolic Panel: Recent Labs  Lab 12/30/20 0408 12/30/20 1746 12/31/20 0525 01/01/21 0230 01/02/21 0020 01/03/21 0243 01/04/21 0247 01/05/21 0622  NA 153*   < > 148* 146* 145 143 144 146*  K 3.2*   < > 3.9 4.0 4.0 4.1 3.4* 3.7  CL 119*   < > 117* 111 111 108 108 111  CO2 26   < > 26 28 28 27 27 27   GLUCOSE 191*   < > 185* 188* 166* 195* 106* 136*  BUN 59*   < > 49* 52* 55* 54* 57* 56*  CREATININE 1.18   < > 1.06 1.16 1.20 1.05 0.99 1.03  CALCIUM 8.1*   < > 8.8* 8.9 8.8* 8.9 8.7* 8.7*  MG 2.5*  --  2.2  --  2.2  --   --   --   PHOS 2.5  --  3.5  --   --   --   --   --    < > = values in this interval not displayed.   GFR: Estimated Creatinine Clearance: 107.9 mL/min (by C-G formula based on SCr of 1.03 mg/dL). Liver Function Tests: Recent Labs  Lab 12/31/20 1151 01/02/21 0020  AST 21 22  ALT 16 14  ALKPHOS 58 62  BILITOT 0.5 0.5  PROT 7.4 7.2  ALBUMIN 2.3* 2.2*   No results for input(s): LIPASE, AMYLASE in the last 168  hours. Recent Labs  Lab 01/04/21 1050  AMMONIA 34   Coagulation Profile: No results for input(s): INR, PROTIME in the last 168 hours. Cardiac Enzymes: No results for input(s): CKTOTAL, CKMB, CKMBINDEX, TROPONINI in the last 168 hours. BNP (last 3 results) No results for input(s): PROBNP in the last 8760 hours. HbA1C: No results for input(s): HGBA1C in the last 72 hours. CBG: Recent Labs  Lab 01/04/21 1925 01/04/21 2254 01/05/21 0255 01/05/21 0745 01/05/21 1124  GLUCAP 141* 128* 112* 149* 121*   Lipid Profile: No results for input(s): CHOL, HDL, LDLCALC, TRIG, CHOLHDL, LDLDIRECT in the last 72 hours. Thyroid Function Tests: No results for input(s): TSH, T4TOTAL, FREET4, T3FREE, THYROIDAB in the last 72 hours. Anemia Panel: No results for input(s): VITAMINB12, FOLATE, FERRITIN, TIBC, IRON, RETICCTPCT in the last 72 hours. Sepsis Labs: No results for input(s): PROCALCITON, LATICACIDVEN in the last 168 hours.  Recent Results (from the past 240 hour(s))  Culture, Urine     Status: None   Collection Time: 01/02/21  4:06 AM   Specimen: Urine, Random  Result Value Ref Range Status   Specimen Description URINE, RANDOM  Final   Special Requests NONE  Final   Culture   Final    NO GROWTH Performed at [facility], [address] [place], [place]    Report Status 01/03/2021 FINAL  Final  Culture, blood (routine x 2)     Status: Abnormal   Collection Time: 01/02/21  4:19 AM   Specimen: BLOOD LEFT HAND  Result Value Ref Range Status   Specimen Description BLOOD LEFT HAND  Final   Special Requests AEROBIC BOTTLE ONLY Blood Culture adequate volume  Final   Culture  Setup Time   Final    GRAM POSITIVE COCCI IN CLUSTERS AEROBIC BOTTLE ONLY CRITICAL VALUE NOTED.  VALUE IS CONSISTENT WITH PREVIOUSLY REPORTED AND CALLED VALUE.    Culture (A)  Final  STAPHYLOCOCCUS EPIDERMIDIS SUSCEPTIBILITIES PERFORMED ON PREVIOUS CULTURE WITHIN THE LAST 5 DAYS. Performed at  [facility], [address] [place], [place] [address]    Report Status 01/05/2021 FINAL  Final  Culture, blood (routine x 2)     Status: Abnormal   Collection Time: 01/02/21  4:32 AM   Specimen: BLOOD LEFT HAND  Result Value Ref Range Status   Specimen Description BLOOD LEFT HAND  Final   Special Requests   Final    BOTTLES DRAWN AEROBIC AND ANAEROBIC Blood Culture results may not be optimal due to an inadequate volume of blood received in culture bottles   Culture  Setup Time   Final    GRAM POSITIVE COCCI ANAEROBIC BOTTLE ONLY CRITICAL RESULT CALLED TO, READ BACK BY AND VERIFIED WITH: PHARMD GREG ABBOTT 01/03/2021 AT 0630 A.HUGHES Performed at [facility], [address] [place], [place] [address]    Culture STAPHYLOCOCCUS EPIDERMIDIS (A)  Final   Report Status 01/05/2021 FINAL  Final   Organism ID, Bacteria STAPHYLOCOCCUS EPIDERMIDIS  Final      Susceptibility   Staphylococcus epidermidis - MIC*    CIPROFLOXACIN >=8 RESISTANT Resistant     ERYTHROMYCIN >=8 RESISTANT Resistant     GENTAMICIN >=16 RESISTANT Resistant     OXACILLIN >=4 RESISTANT Resistant     TETRACYCLINE 2 SENSITIVE Sensitive     VANCOMYCIN 1 SENSITIVE Sensitive     TRIMETH/SULFA 80 RESISTANT Resistant     CLINDAMYCIN >=8 RESISTANT Resistant     RIFAMPIN <=0.5 SENSITIVE Sensitive     Inducible Clindamycin NEGATIVE Sensitive     * STAPHYLOCOCCUS EPIDERMIDIS  Blood Culture ID Panel (Reflexed)     Status: Abnormal   Collection Time: 01/02/21  4:32 AM  Result Value Ref Range Status   Enterococcus faecalis NOT DETECTED NOT DETECTED Final   Enterococcus Faecium NOT DETECTED NOT DETECTED Final   Listeria monocytogenes NOT DETECTED NOT DETECTED Final   Staphylococcus species DETECTED (A) NOT DETECTED Final    Comment: CRITICAL RESULT CALLED TO, READ BACK BY AND VERIFIED WITH: PHARMD GREG ABBOTT 01/03/2021 AT 0630 A.HUGHES    Staphylococcus aureus (BCID) NOT DETECTED NOT DETECTED Final    Staphylococcus epidermidis DETECTED (A) NOT DETECTED Final    Comment: Methicillin (oxacillin) resistant coagulase negative staphylococcus. Possible blood culture contaminant (unless isolated from more than one blood culture draw or clinical case suggests pathogenicity). No antibiotic treatment is indicated for blood  culture contaminants. CRITICAL RESULT CALLED TO, READ BACK BY AND VERIFIED WITH: PHARMD GREG ABBOTT 01/03/2021 AT 0630 A.HUGHES    Staphylococcus lugdunensis NOT DETECTED NOT DETECTED Final   Streptococcus species NOT DETECTED NOT DETECTED Final   Streptococcus agalactiae NOT DETECTED NOT DETECTED Final   Streptococcus pneumoniae NOT DETECTED NOT DETECTED Final   Streptococcus pyogenes NOT DETECTED NOT DETECTED Final   A.calcoaceticus-baumannii NOT DETECTED NOT DETECTED Final   Bacteroides fragilis NOT DETECTED NOT DETECTED Final   Enterobacterales NOT DETECTED NOT DETECTED Final   Enterobacter cloacae complex NOT DETECTED NOT DETECTED Final   Escherichia coli NOT DETECTED NOT DETECTED Final   Klebsiella aerogenes NOT DETECTED NOT DETECTED Final   Klebsiella oxytoca NOT DETECTED NOT DETECTED Final   Klebsiella pneumoniae NOT DETECTED NOT DETECTED Final   Proteus species NOT DETECTED NOT DETECTED Final   Salmonella species NOT DETECTED NOT DETECTED Final   Serratia marcescens NOT DETECTED NOT DETECTED Final   Haemophilus influenzae NOT DETECTED NOT DETECTED Final   Neisseria meningitidis NOT DETECTED NOT  DETECTED Final   Pseudomonas aeruginosa NOT DETECTED NOT DETECTED Final   Stenotrophomonas maltophilia NOT DETECTED NOT DETECTED Final   Candida albicans NOT DETECTED NOT DETECTED Final   Candida auris NOT DETECTED NOT DETECTED Final   Candida glabrata NOT DETECTED NOT DETECTED Final   Candida krusei NOT DETECTED NOT DETECTED Final   Candida parapsilosis NOT DETECTED NOT DETECTED Final   Candida tropicalis NOT DETECTED NOT DETECTED Final   Cryptococcus neoformans/gattii  NOT DETECTED NOT DETECTED Final   Methicillin resistance mecA/C DETECTED (A) NOT DETECTED Final    Comment: CRITICAL RESULT CALLED TO, READ BACK BY AND VERIFIED WITH: PHARMD GREG ABBOTT 01/03/2021 AT 0630 A.HUGHES Performed at [facility], [address] [place], [place] [address]   Culture, blood (Routine X 2) w Reflex to ID Panel     Status: Abnormal (Preliminary result)   Collection Time: 01/03/21 11:31 AM   Specimen: BLOOD LEFT HAND  Result Value Ref Range Status   Specimen Description BLOOD LEFT HAND  Final   Special Requests   Final    BOTTLES DRAWN AEROBIC ONLY Blood Culture adequate volume   Culture  Setup Time   Final    GRAM NEGATIVE RODS AEROBIC BOTTLE ONLY PHARMD GREG ABBOTT 01/04/2021 AT 0425 A.HUGHES CRITICAL VALUE NOTED.  VALUE IS CONSISTENT WITH PREVIOUSLY REPORTED AND CALLED VALUE. Performed at [facility], [address] [place], [place] [address]    Culture ESCHERICHIA COLI (A)  Final   Report Status PENDING  Incomplete  Culture, blood (Routine X 2) w Reflex to ID Panel     Status: Abnormal (Preliminary result)   Collection Time: 01/03/21 11:31 AM   Specimen: BLOOD LEFT FOREARM  Result Value Ref Range Status   Specimen Description BLOOD LEFT FOREARM  Final   Special Requests   Final    BOTTLES DRAWN AEROBIC ONLY Blood Culture adequate volume   Culture  Setup Time   Final    GRAM NEGATIVE RODS AEROBIC BOTTLE ONLY CRITICAL RESULT CALLED TO, READ BACK BY AND VERIFIED WITH: PHARMD GREG ABBOTT 01/04/2021 AT 0425 A.HUGHES    Culture (A)  Final    ESCHERICHIA COLI SUSCEPTIBILITIES TO FOLLOW Performed at [facility], [address] [place], [place] [address]    Report Status PENDING  Incomplete  Blood Culture ID Panel (Reflexed)     Status: Abnormal   Collection Time: 01/03/21 11:31 AM  Result Value Ref Range Status   Enterococcus faecalis NOT DETECTED NOT DETECTED Final   Enterococcus Faecium NOT DETECTED NOT DETECTED Final   Listeria  monocytogenes NOT DETECTED NOT DETECTED Final   Staphylococcus species NOT DETECTED NOT DETECTED Final   Staphylococcus aureus (BCID) NOT DETECTED NOT DETECTED Final   Staphylococcus epidermidis NOT DETECTED NOT DETECTED Final   Staphylococcus lugdunensis NOT DETECTED NOT DETECTED Final   Streptococcus species NOT DETECTED NOT DETECTED Final   Streptococcus agalactiae NOT DETECTED NOT DETECTED Final   Streptococcus pneumoniae NOT DETECTED NOT DETECTED Final   Streptococcus pyogenes NOT DETECTED NOT DETECTED Final   A.calcoaceticus-baumannii NOT DETECTED NOT DETECTED Final   Bacteroides fragilis NOT DETECTED NOT DETECTED Final   Enterobacterales DETECTED (A) NOT DETECTED Final    Comment: Enterobacterales represent a large order of gram negative bacteria, not a single organism. CRITICAL RESULT CALLED TO, READ BACK BY AND VERIFIED WITH: PHARMD GREG ABBOTT 01/04/2021 AT 0425 A.HUGHES    Enterobacter cloacae complex NOT DETECTED NOT DETECTED Final   Escherichia coli DETECTED (A) NOT  DETECTED Final    Comment: CRITICAL RESULT CALLED TO, READ BACK BY AND VERIFIED WITH: PHARMD GREG ABBOTT 01/04/2021 AT 0425 A.HUGHES    Klebsiella aerogenes NOT DETECTED NOT DETECTED Final   Klebsiella oxytoca NOT DETECTED NOT DETECTED Final   Klebsiella pneumoniae NOT DETECTED NOT DETECTED Final   Proteus species NOT DETECTED NOT DETECTED Final   Salmonella species NOT DETECTED NOT DETECTED Final   Serratia marcescens NOT DETECTED NOT DETECTED Final   Haemophilus influenzae NOT DETECTED NOT DETECTED Final   Neisseria meningitidis NOT DETECTED NOT DETECTED Final   Pseudomonas aeruginosa NOT DETECTED NOT DETECTED Final   Stenotrophomonas maltophilia NOT DETECTED NOT DETECTED Final   Candida albicans NOT DETECTED NOT DETECTED Final   Candida auris NOT DETECTED NOT DETECTED Final   Candida glabrata NOT DETECTED NOT DETECTED Final   Candida krusei NOT DETECTED NOT DETECTED Final   Candida parapsilosis NOT  DETECTED NOT DETECTED Final   Candida tropicalis NOT DETECTED NOT DETECTED Final   Cryptococcus neoformans/gattii NOT DETECTED NOT DETECTED Final   CTX-M ESBL NOT DETECTED NOT DETECTED Final   Carbapenem resistance IMP NOT DETECTED NOT DETECTED Final   Carbapenem resistance KPC NOT DETECTED NOT DETECTED Final   Carbapenem resistance NDM NOT DETECTED NOT DETECTED Final   Carbapenem resist OXA 48 LIKE NOT DETECTED NOT DETECTED Final   Carbapenem resistance VIM NOT DETECTED NOT DETECTED Final    Comment: Performed at [facility], [address], [place], [place] [address]     Radiology Studies: CT ABDOMEN PELVIS WO CONTRAST  Result Date: 01/05/2021 CLINICAL DATA:  Ureteral stent displacement. EXAM: CT ABDOMEN AND PELVIS WITHOUT CONTRAST TECHNIQUE: Multidetector CT imaging of the abdomen and pelvis was performed following the standard protocol without IV contrast. COMPARISON:  CT of the abdomen and pelvis 12/23/2020 FINDINGS: Lower chest: Bibasilar airspace disease is improved. No significant effusion or pneumothorax is present. Heart size is normal. NG tube is in place. Hepatobiliary: No focal liver abnormality is seen. No gallstones, gallbladder wall thickening, or biliary dilatation. Pancreas: Unremarkable. No pancreatic ductal dilatation or surrounding inflammatory changes. Spleen: Normal in size without focal abnormality. Adrenals/Urinary Tract: Adrenal glands are normal bilaterally. Right kidney is within normal limits. Ureters unremarkable. Double-J ureteral stent is stable in position in the left kidney. 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 Foley catheter is present in the bladder. Bladder is otherwise decompressed. Stomach/Bowel: Stomach is unremarkable. Duodenum is normal. Small bowel is within normal limits. Terminal ileum is within normal limits. The appendix is not discretely visualized and may be surgically absent. Contrast is present  throughout the colon. Colon is unremarkable. Vascular/Lymphatic: Atherosclerotic calcifications are present in the aorta and branch vessels. Distal aortic aneurysm measuring up to 5.0 cm again noted. Proximal left iliac artery aneurysm measuring 4.2 cm noted. No aneurysm is present a right iliac artery. No additional aortic aneurysm is present. Reproductive: Prostate is unremarkable. Other: No abdominal wall hernia or abnormality. No abdominopelvic ascites. Musculoskeletal: Vertebral body heights maintained. No significant listhesis is present. Levoconvex scoliosis stable. No focal lytic or blastic lesions are present. Hips are located and within normal limits. IMPRESSION: 1. Double-J ureteral stent is stable in position in the left kidney. 2. 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 3. 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 4. 5.0 cm distal abdominal aortic aneurysm. Recommend follow-up CT/MR every 6 months and vascular consultation. This recommendation follows ACR  consensus guidelines: White Paper of the ACR Incidental Findings Committee II on Vascular Findings. J Am Coll Radiol 2013; 10:789-794. 5. Improved bibasilar airspace disease. 6. Aortic Atherosclerosis (ICD10-I70.0). Electronically Signed   By: Marin Roberts M.D.   On: 01/05/2021 13:04   CT HEAD WO CONTRAST  Result Date: 01/05/2021 CLINICAL DATA:  Delirium.  Altered mental status. EXAM: CT HEAD WITHOUT CONTRAST TECHNIQUE: Contiguous axial images were obtained from the base of the skull through the vertex without intravenous contrast. COMPARISON:  MRI/MRA head 12/22/2020. FINDINGS: Brain: Mildly motion degraded exam. 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 There is no acute intracranial hemorrhage. No demarcated cortical infarct. No extra-axial fluid collection. No evidence of intracranial mass. No midline  shift. Vascular: No hyperdense vessel.  Atherosclerotic calcifications Skull: Normal. Negative for fracture or focal lesion. Sinuses/Orbits: Visualized orbits show no acute finding. Postsurgical appearance of the paranasal sinuses. Mucosal thickening and frothy secretions within the left frontal sinus. Mild mucosal thickening within the bilateral ethmoidectomy cavities. Near complete opacification of the sphenoid sinuses. Mild-to-moderate mucosal thickening, small volume frothy secretions and small mucous retention cysts within the right maxillary sinus. Mild mucosal thickening and small mucous retention cysts within the left maxillary sinus. Other: Bilateral mastoid effusions.  Nasoenteric tube. IMPRESSION: Mildly motion degraded examination. No evidence of acute intracranial abnormality. 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 Paranasal sinus disease, as described. Bilateral mastoid effusions. Electronically Signed   By: Jackey Loge DO   On: 01/05/2021 10:17   VAS [place] LOWER EXTREMITY VENOUS (DVT)  Result Date: 01/05/2021  Lower Venous DVT Study Patient Name:  Damarien Nyman  Date of Exam:   01/04/2021 Medical Rec #: [id number]     Accession #:    [id number] Date of Birth: 02/25/1961    Patient Gender: M Patient Age:   51Y Exam Location:  [facility] Procedure:      VAS [place] LOWER EXTREMITY VENOUS (DVT) Referring Phys: Kathlynn Grate --------------------------------------------------------------------------------  Indications: Recurrent fevers.  Limitations: Body habitus, poor ultrasound/tissue interface and poor patient cooperation/movement. Comparison Study: 12/20/2020 and 12/15/2020- negative lower extremtiy venous                   duplexes Performing Technologist: Gertie Fey MHA, RDMS, RVT, RDCS  Examination Guidelines: A complete evaluation includes B-mode imaging, spectral Doppler, color Doppler, and power Doppler as needed  of all accessible portions of each vessel. Bilateral testing is considered an integral part of a complete examination. Limited examinations for reoccurring indications may be performed as noted. The reflux portion of the exam is performed with the patient in reverse Trendelenburg.  +---------+---------------+---------+-----------+----------+--------------+ RIGHT    CompressibilityPhasicitySpontaneityPropertiesThrombus Aging +---------+---------------+---------+-----------+----------+--------------+ CFV                     Yes      Yes                                 +---------+---------------+---------+-----------+----------+--------------+ SFJ                              Yes                                 +---------+---------------+---------+-----------+----------+--------------+  FV Prox  Full                                                        +---------+---------------+---------+-----------+----------+--------------+ FV Mid   Full                                                        +---------+---------------+---------+-----------+----------+--------------+ FV DistalFull                                                        +---------+---------------+---------+-----------+----------+--------------+ POP      Full           Yes      Yes                                 +---------+---------------+---------+-----------+----------+--------------+ PTV      Full                                                        +---------+---------------+---------+-----------+----------+--------------+ PERO     Full                                                        +---------+---------------+---------+-----------+----------+--------------+ VV       None                                         Acute          +---------+---------------+---------+-----------+----------+--------------+ Unable to perform compression maneuver at groin level due to poor  patient cooperation.  Right Technical Findings: Not visualized segments include PFV.  +---------+---------------+---------+-----------+----------+--------------+ LEFT     CompressibilityPhasicitySpontaneityPropertiesThrombus Aging +---------+---------------+---------+-----------+----------+--------------+ CFV      Full           Yes      Yes                                 +---------+---------------+---------+-----------+----------+--------------+ SFJ      Full                                                        +---------+---------------+---------+-----------+----------+--------------+ FV Prox  Full                                                        +---------+---------------+---------+-----------+----------+--------------+  FV Mid   Full                                                        +---------+---------------+---------+-----------+----------+--------------+ FV DistalFull                                                        +---------+---------------+---------+-----------+----------+--------------+ POP      Full           Yes      Yes                                 +---------+---------------+---------+-----------+----------+--------------+ PTV      Full                                                        +---------+---------------+---------+-----------+----------+--------------+ PERO     Full                                                        +---------+---------------+---------+-----------+----------+--------------+   Left Technical Findings: Not visualized segments include PFV.   Summary: RIGHT: - There is no evidence of deep vein thrombosis in the lower extremity. However, portions of this examination were limited- see technologist comments above. Evidence of acute superficial vein thrombosis involving a varicose vein of the mid medial right calf.  - No cystic structure found in the popliteal fossa.  LEFT: - There is no  evidence of deep vein thrombosis in the lower extremity. However, portions of this examination were limited- see technologist comments above.  - No cystic structure found in the popliteal fossa.  *See table(s) above for measurements and observations. Electronically signed by Waverly Ferrarihristopher Dickson MD on 01/05/2021 at 7:30:16 AM.    Final    VAS US UPPER EXTREMITY VENOUS DUPLEX  Result Date: 01/05/2021 UPPER VENOUS STUDY  Patient Name:  Redgie Grayerlvin Boehme  Date of Exam:   01/04/2021 Medical Rec #: [id number]     Accession #:    [id number] Date of Birth: 1960-12-09    Patient Gender: M Patient Age:   59Y [facility] Procedure:      VAS US UPPER EXTREMITY VENOUS DUPLEX Referring Phys: Kathlynn GrateANDREW N WALLACE --------------------------------------------------------------------------------  Indications: Recurrent fevers Limitations: Body habitus, poor ultrasound/tissue interface and poor patient cooperation/movement. Comparison Study: 12/25/2020- left IJV and subclavian vein DVT Performing Technologist: Gertie FeyMichelle Simonetti MHA, RDMS, RVT, RDCS  Examination Guidelines: A complete evaluation includes B-mode imaging, spectral Doppler, color Doppler, and power Doppler as needed of all accessible portions of each vessel. Bilateral testing is considered an integral part of a complete examination. Limited examinations for reoccurring indications may be performed as noted.  Right Findings: +----------+------------+---------+-----------+----------+--------------+ RIGHT     CompressiblePhasicitySpontaneousProperties   Summary     +----------+------------+---------+-----------+----------+--------------+  IJV                                                 Not visualized +----------+------------+---------+-----------+----------+--------------+ Subclavian    Full       Yes       Yes                             +----------+------------+---------+-----------+----------+--------------+ Axillary      Full        Yes       Yes                             +----------+------------+---------+-----------+----------+--------------+ Brachial      Full       Yes       Yes                             +----------+------------+---------+-----------+----------+--------------+ Radial        Full                                                 +----------+------------+---------+-----------+----------+--------------+ Ulnar         Full                                                 +----------+------------+---------+-----------+----------+--------------+ Cephalic      Full                                                 +----------+------------+---------+-----------+----------+--------------+ Basilic                                             Not visualized +----------+------------+---------+-----------+----------+--------------+  Left Findings: +----------+------------+---------+-----------+----------+--------------+ LEFT      CompressiblePhasicitySpontaneousProperties   Summary     +----------+------------+---------+-----------+----------+--------------+ IJV           Full                                                 +----------+------------+---------+-----------+----------+--------------+ Subclavian    Full       Yes       Yes                             +----------+------------+---------+-----------+----------+--------------+ Axillary                 Yes       Yes                             +----------+------------+---------+-----------+----------+--------------+  Brachial      Full       Yes       Yes                             +----------+------------+---------+-----------+----------+--------------+ Radial        Full                                                 +----------+------------+---------+-----------+----------+--------------+ Ulnar                                               Not visualized  +----------+------------+---------+-----------+----------+--------------+ Cephalic                                            Not visualized +----------+------------+---------+-----------+----------+--------------+ Basilic                                             Not visualized +----------+------------+---------+-----------+----------+--------------+ Unable to perform compression maneuvers on some segments due to technical limitations.  Summary:  Bilateral: Study was technically limited as described above. No obvious evidence of DVT or superficial vein thrombosis involving visualized veins of bilateral upper extremities. When compared to prior study, previous DVT in left IJV and subclavian veins appear to have resolved.  *See table(s) above for measurements and observations.  Diagnosing physician: Waverly Ferrari MD Electronically signed by Waverly Ferrari MD on 01/05/2021 at 7:31:09 AM.    Final    ECHOCARDIOGRAM LIMITED  Result Date: 01/04/2021    ECHOCARDIOGRAM LIMITED REPORT   Patient Name:   ABDON PETROSKY Date of Exam: 01/04/2021 Medical Rec #:  [id number]    Height:       74.0 in Accession #:    [id number]   Weight:       265.9 lb Date of Birth:  08/16/1961   BSA:          2.453 m Patient Age:    59 years     BP:           143/78 mmHg Patient Gender: M            HR:           112 bpm. Exam Location:  Inpatient Procedure: 2D Echo, Cardiac Doppler and Color Doppler Indications:    Bacteremia  History:        Patient has prior history of Echocardiogram examinations, most                 recent 12/25/2020. Risk Factors:Hypertension and Former Smoker.  Sonographer:    Shirlean Kelly Referring Phys: [id number] Kathlynn Grate IMPRESSIONS  1. Mild intracavitary gradient. Peak velocity 1.93 m/s. Peak gradient 15 mmHg. Left ventricular ejection fraction, by estimation, is 60 to 65%. The left ventricle has normal function. The left ventricle has no regional wall motion abnormalities.  2. Right  ventricular systolic function is normal. The right ventricular size is normal. There is normal pulmonary artery  systolic pressure.  3. The mitral valve is normal in structure. No evidence of mitral valve regurgitation. No evidence of mitral stenosis.  4. The aortic valve is normal in structure. There is mild calcification of the aortic valve. No aortic stenosis is present.  5. The inferior vena cava is normal in size with greater than 50% respiratory variability, suggesting right atrial pressure of 3 mmHg. FINDINGS  Left Ventricle: Mild intracavitary gradient. Peak velocity 1.93 m/s. Peak gradient 15 mmHg. Left ventricular ejection fraction, by estimation, is 60 to 65%. The left ventricle has normal function. The left ventricle has no regional wall motion abnormalities. The left ventricular internal cavity size was normal in size. There is no left ventricular hypertrophy. Right Ventricle: The right ventricular size is normal. No increase in right ventricular wall thickness. Right ventricular systolic function is normal. There is normal pulmonary artery systolic pressure. The tricuspid regurgitant velocity is 1.64 m/s, and  with an assumed right atrial pressure of 3 mmHg, the estimated right ventricular systolic pressure is 13.8 mmHg. Left Atrium: Left atrial size was normal in size. Right Atrium: Right atrial size was normal in size. Pericardium: There is no evidence of pericardial effusion. Mitral Valve: The mitral valve is normal in structure. No evidence of mitral valve stenosis. Tricuspid Valve: The tricuspid valve is normal in structure. Tricuspid valve regurgitation is trivial. No evidence of tricuspid stenosis. Aortic Valve: The aortic valve is normal in structure. There is mild calcification of the aortic valve. No aortic stenosis is present. Aortic valve mean gradient measures 8.0 mmHg. Aortic valve peak gradient measures 14.7 mmHg. Pulmonic Valve: The pulmonic valve was normal in structure. Pulmonic valve  regurgitation is not visualized. No evidence of pulmonic stenosis. Aorta: The aortic root is normal in size and structure. Venous: The inferior vena cava is normal in size with greater than 50% respiratory variability, suggesting right atrial pressure of 3 mmHg. IAS/Shunts: No atrial level shunt detected by color flow Doppler. AORTIC VALVE AV Vmax:           191.50 cm/s AV Vmean:          133.500 cm/s AV VTI:            0.238 m AV Peak Grad:      14.7 mmHg AV Mean Grad:      8.0 mmHg LVOT Vmax:         149.00 cm/s LVOT Vmean:        94.600 cm/s LVOT VTI:          0.198 m LVOT/AV VTI ratio: 0.83 TRICUSPID VALVE TR Peak grad:   10.8 mmHg TR Vmax:        164.00 cm/s  SHUNTS Systemic VTI: 0.20 m Chilton Si MD Electronically signed by Chilton Si MD Signature Date/Time: 01/04/2021/3:07:29 PM    Final      LOS: 26 days   Lanae Boast, MD [facility]  01/05/2021, 1:09 PM

## 2021-01-05 NOTE — Progress Notes (Signed)
SLP Cancellation Note  Patient Details Name: [name] MRN: [id number] DOB: 01-13-1961   Cancelled treatment:       Reason Eval/Treat Not Completed: Patient at procedure or test/unavailable   Azucena Dart, Riley Nearing 01/05/2021, 9:46 AM

## 2021-01-06 LAB — BASIC METABOLIC PANEL
Anion gap: 9 (ref 5–15)
BUN: 52 mg/dL — ABNORMAL HIGH (ref 6–20)
CO2: 25 mmol/L (ref 22–32)
Calcium: 8.8 mg/dL — ABNORMAL LOW (ref 8.9–10.3)
Chloride: 112 mmol/L — ABNORMAL HIGH (ref 98–111)
Creatinine, Ser: 1 mg/dL (ref 0.61–1.24)
GFR, Estimated: >60 mL/min (ref >60)
Glucose, Bld: 182 mg/dL — ABNORMAL HIGH (ref 70–99)
Potassium: 3.8 mmol/L (ref 3.5–5.1)
Sodium: 146 mmol/L — ABNORMAL HIGH (ref 135–145)

## 2021-01-06 LAB — URINE CULTURE: Culture: NO GROWTH

## 2021-01-06 LAB — CULTURE, BLOOD (ROUTINE X 2)
Special Requests: ADEQUATE
Special Requests: ADEQUATE

## 2021-01-06 LAB — CBC
HCT: 31.2 % — ABNORMAL LOW (ref 39.0–52.0)
Hemoglobin: 9.6 g/dL — ABNORMAL LOW (ref 13.0–17.0)
MCH: 31.2 pg (ref 26.0–34.0)
MCHC: 30.8 g/dL (ref 30.0–36.0)
MCV: 101.3 fL — ABNORMAL HIGH (ref 80.0–100.0)
Platelets: 201 10*3/uL (ref 150–400)
RBC: 3.08 MIL/uL — ABNORMAL LOW (ref 4.22–5.81)
RDW: 14.1 % (ref 11.5–15.5)
WBC: 13.9 10*3/uL — ABNORMAL HIGH (ref 4.0–10.5)
nRBC: 0 % (ref 0.0–0.2)

## 2021-01-06 LAB — GLUCOSE, CAPILLARY
Glucose-Capillary: 168 mg/dL — ABNORMAL HIGH (ref 70–99)
Glucose-Capillary: 133 mg/dL — ABNORMAL HIGH (ref 70–99)
Glucose-Capillary: 194 mg/dL — ABNORMAL HIGH (ref 70–99)
Glucose-Capillary: 133 mg/dL — ABNORMAL HIGH (ref 70–99)
Glucose-Capillary: 126 mg/dL — ABNORMAL HIGH (ref 70–99)
Glucose-Capillary: 159 mg/dL — ABNORMAL HIGH (ref 70–99)

## 2021-01-06 LAB — CK: Total CK: 129 U/L (ref 49–397)

## 2021-01-06 LAB — HEPATITIS B SURFACE ANTIBODY, QUANTITATIVE: Hep B S AB Quant (Post): <3.1 m[IU]/mL — ABNORMAL LOW (ref >9.9)

## 2021-01-06 MED ORDER — HYDROMORPHONE HCL 1 MG/ML IJ SOLN
1.0000 mg | INTRAMUSCULAR | Status: DC | PRN
  Administered 2021-01-09 – 2021-01-18 (×4): 1 mg via INTRAVENOUS
  Filled 2021-01-06 (×4): qty 1

## 2021-01-06 MED ORDER — DULOXETINE HCL 30 MG PO CPEP
30.0000 mg | ORAL_CAPSULE | Freq: Every day | ORAL | Status: DC
  Administered 2021-01-06 – 2021-02-02 (×28): 30 mg via ORAL
  Filled 2021-01-06 (×28): qty 1

## 2021-01-06 MED ORDER — METRONIDAZOLE 500 MG PO TABS
500.0000 mg | ORAL_TABLET | Freq: Two times a day (BID) | ORAL | Status: AC
  Administered 2021-01-06 – 2021-01-15 (×20): 500 mg via ORAL
  Filled 2021-01-06 (×20): qty 1

## 2021-01-06 NOTE — Progress Notes (Addendum)
Physical Therapy Wound Treatment Patient Details  Name: [name] MRN: [id number] Date of Birth: 06/15/1961  Today's Date: 01/06/2021 Time: 1030-1108 Time Calculation (min): 38 min  Subjective  Subjective Assessment Subjective: Restless and appears painful at times. R wrist restraint Patient and Family Stated Goals: Pt asking for water Date of Onset:  (Unknown) Prior Treatments:  (Dressing changes)  Pain Score:  Pt premedicated however appears painful at times.  Wound Assessment  Pressure Injury 12/21/20 Buttocks Right Deep Tissue Pressure Injury - Purple or maroon localized area of discolored intact skin or blood-filled blister due to damage of underlying soft tissue from pressure and/or shear. purple with blister area that has  (Active)  Dressing Type ABD;Barrier Film (skin prep);Gauze (Comment);Moist to moist;Non adherent;Santyl 01/06/21 1119  Dressing Changed;Clean;Dry;Intact 01/06/21 1119  Dressing Change Frequency Monday, Wednesday, Friday 01/06/21 1119  State of Healing Non-healing 01/06/21 1119  Site / Wound Assessment Bleeding;Pink;Yellow;Brown 01/06/21 1119  % Wound base Red or Granulating 25% 01/06/21 1119  % Wound base Yellow/Fibrinous Exudate 65% 01/06/21 1119  % Wound base Black/Eschar 10% 01/06/21 1119  % Wound base Other/Granulation Tissue (Comment) 0% 01/06/21 1119  Peri-wound Assessment Erythema (blanchable);Intact;Bleeding 01/06/21 1119  Wound Length (cm) 11 cm 01/01/21 1000  Wound Width (cm) 6.5 cm 01/01/21 1000  Wound Depth (cm) 2.5 cm 01/01/21 1000  Wound Surface Area (cm^2) 71.5 cm^2 01/01/21 1000  Wound Volume (cm^3) 178.75 cm^3 01/01/21 1000  Tunneling (cm) 0 01/01/21 1000  Undermining (cm) 0 01/01/21 1000  Margins Unattached edges (unapproximated) 01/06/21 1119  Drainage Amount Minimal 01/06/21 1119  Drainage Description Serosanguineous 01/06/21 1119  Treatment Debridement (Selective);Hydrotherapy (Pulse lavage);Packing (Saline gauze) 01/06/21 1119       Hydrotherapy Pulsed lavage therapy - wound location: R buttock Pulsed Lavage with Suction (psi): 12 psi Pulsed Lavage with Suction - Normal Saline Used: 1000 mL Pulsed Lavage Tip: Tip with splash shield Selective Debridement Selective Debridement - Location: R buttock Selective Debridement - Tools Used: Forceps,Scalpel,Scissors Selective Debridement - Tissue Removed: eschar, yellow slough    Wound Assessment and Plan  Wound Therapy - Assess/Plan/Recommendations Wound Therapy - Clinical Statement: Pt with a small skin tear to periwound area when tape was removed today. Recommend barrier film (skin prep) at every dressing change to decrease the risk of future skin tears with dressing removal. Non-adherent (Xeroform) placed over areas of breakdown and skin tears around the peri-wound area prior to dressing being placed. Most of the tissue at the wound bed continues to appear to be adipose (a mixture of non-viable and viable). This pt wil benefit from continued hydrotherapy for selective debridement of unviable tissue, to decrease bioburden and promote wound bed healing. Wound Therapy - Functional Problem List: Global weakness in the setting of critical illness and extended ICU stay. Factors Delaying/Impairing Wound Healing: Immobility,Multiple medical problems,Polypharmacy,Infection - systemic/local Hydrotherapy Plan: Dressing change,Patient/family education,Pulsatile lavage with suction,Debridement Wound Therapy - Frequency: 3X / week (hydrotherapy MWF with nursing to continue with daily dressing changes in between.) Wound Therapy - Follow Up Recommendations: dressing changes by RN  Wound Therapy Goals- Improve the function of patient's integumentary system by progressing the wound(s) through the phases of wound healing (inflammation - proliferation - remodeling) by: Wound Therapy Goals - Improve the function of patient's integumentary system by progressing the wound(s) through the phases  of wound healing by: Decrease Necrotic Tissue to: 25% Decrease Necrotic Tissue - Progress: Progressing toward goal Increase Granulation Tissue to: 75% Increase Granulation Tissue - Progress: Progressing toward goal Improve Drainage Characteristics:  Min,Serous Improve Drainage Characteristics - Progress: Progressing toward goal Goals/treatment plan/discharge plan were made with and agreed upon by patient/family: No, Patient unable to participate in goals/treatment/discharge plan and family unavailable Time For Goal Achievement: 7 days Wound Therapy - Potential for Goals: Good  Goals will be updated until maximal potential achieved or discharge criteria met.  Discharge criteria: when goals achieved, discharge from hospital, MD decision/surgical intervention, no progress towards goals, refusal/missing three consecutive treatments without notification or medical reason.  GP     Charges PT Wound Care Charges $Wound Debridement up to 20 cm: < or equal to 20 cm $ Wound Debridement each add'l 20 sqcm: 2 $PT PLS Gun and Tip: 1 Supply $PT Hydrotherapy Visit: 1 Visit       [name] 01/06/2021, 11:26 AM   Rolinda Roan, PT, DPT Acute Rehabilitation Services Pager: [phone] Office: [phone]

## 2021-01-06 NOTE — Plan of Care (Signed)
  Problem: Education: Goal: Knowledge of General Education information will improve Description Including pain rating scale, medication(s)/side effects and non-pharmacologic comfort measures Outcome: Progressing   

## 2021-01-06 NOTE — Progress Notes (Signed)
  Speech Language Pathology Treatment: Dysphagia;Cognitive-Linquistic  Patient Details Name: Mandrell Vangilder MRN: [id number] DOB: [birth year] Today's Date: 01/06/2021 Time: 0915-1000 SLP Time Calculation (min) (ACUTE ONLY): 45 min  Assessment / Plan / Recommendation Clinical Impression  Pt not doing well today. He is more lethargic than documented in prior sessions and also has a temp. He was heavily mouth breathing upon arrival, but by the end of the session he was panting, SPO2 found to be 90 after sensor placed and SLP placed [place] at 4L with increased SpO2 to 97. Reported to RN; suspect high level of pain given grimacing and due for meds. Prior to significant distress pt able to follow one step commands for oral care which was needed given dry lingual mucosa from prolonged mouth breathing. Also suspect minimal oral intake has been offered. SLP able to provided some teaspoons of puree and honey thick liquids with verbal cueing needed for oral closure, but pt became progressively more distracted by pain and distressed that POs were stopped. See next note regarding speech and language function as well.   HPI HPI: Pt is a 60 year old male who presented 3/31 to the ED with 4-5 day hx of diarrhea and vomiting. He developed confusion and became belligerent at home. Initial ED evaluation found him to be hypotensive, combative, confused, and he was intubated.  He was noted to have mottling over his trunk and extremities and was  admitted for hypoxemic respiratory failure and septic shock 2/2 E. Coli bacteremia and UTI, and ATN. ETT 3/31-4/5; reintubated 4/5-4/16. 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 CXR 4/14: Worsening pneumonia in both lungs, most confluent at the RIGHT  lung base. CXR 4/22: Mild residual airspace disease in the right mid to lower lung.  Aeration slightly improved. No new airspace opacity      SLP Plan  Continue  with current plan of care       Recommendations  Diet recommendations: Dysphagia 1 (puree);Honey-thick liquid Liquids provided via: Teaspoon;Cup;Straw Medication Administration: Via alternative means Supervision: Staff to assist with self feeding;Full supervision/cueing for compensatory strategies Compensations: Slow rate;Small sips/bites;Follow solids with liquid;Monitor for anterior loss Postural Changes and/or Swallow Maneuvers: Seated upright 90 degrees;Upright 30-60 min after meal                Oral Care Recommendations: Staff/trained caregiver to provide oral care;Oral care BID Plan: Continue with current plan of care       GO               Harlon Ditty, MA CCC-SLP  Acute Rehabilitation Services Pager [phone] Office [phone]  Claudine Mouton 01/06/2021, 11:04 AM

## 2021-01-06 NOTE — Progress Notes (Addendum)
[facility] Infectious Diseases Follow Up Note  Patient Identification: Patient Name: [name] MRN: [id number] [id number] Date: 12/10/2020  3:36 AM Age: 60 y.o.Today's Date: 01/06/2021   Reason for Visit: Follow-up on MRSE bacteremia and E. coli bacteremia, fevers  Active Problems:   Acute respiratory failure (HCC)   AKI (acute kidney injury) ([place])   Bacteremia due to Escherichia coli   Ureteral stone   Pyelonephritis   Cerebral thrombosis with cerebral infarction   Staphylococcus epidermidis bacteremia   Pressure injury of skin  Antibiotics:Ceftriaxone 3/31-4/3,4/7-4/11,4/14-4/15,ceftriaxone 4/24- Vancomycin 4/14-4/25, Daptomycin 4/26  Lines/Tubes:PIV's,urethral catheter,NG tube   Interval Events: Low-grade fever this morning, gradually uptrending leukocytosis, CT head and abdomen pelvis unremarkable.  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  Hep C antibody is nonreactive Right buttock ulcer MRSE bacteremia ( CLABSI) with possible septic thrombophlebitis of left IJ and left subclavian vein on Doppler 4/15 status post removal of PICC line on 4/24.  Doppler on 4/25 DVT in left IJ and left subclavian vein has been resolved.   MSSA pneumonia Recurrent E. coli bacteremia in the setting of prior E. coli bacteremia and pyelonephritis complicated by hydronephrosis status post stent placement on 4/6 and appropriate antibiotic treatment Left ureteral stone Liver cirrhosis Therapeutic drug monitoring   Recommendations Continue daptomycin, monitor CPK Follow-up repeat blood cultures Recommend surgery to evaluate her right buttock ulcer for possible need of debridement. She has necrotic debris on exam.  Added flagyl given foul smell of rt buttock ulcer Would also recommend TEE given MRSE bacteremia, ongoing fevers and  infarct seen in CT head if CONGRUENT with [place] discussion. A TEE will however not make any changes in term of length of tx with Daptomycin given concern for endovascular infection which will need to be treated for 6 weeks anyway Agree with palliative care with goals of care discussion/ Following  Rest of the management as per the primary team. Thank you for the consult. Please page with pertinent questions or concerns.  ______________________________________________________________________ Subjective patient seen and examined at the bedside. He is able follow some commands   Vitals BP 117/63 (BP Location: Left Leg)   Pulse (!) 108   Temp (!) 100.9 F (38.3 C) (Axillary)   Resp 18   Ht _0  (1.88 m)   Wt 120.6 kg   SpO2 94%   BMI 34.14 kg/m     Physical Exam Lying in bed, chronically sick looking , mouth open, mucous membrane very dry, NG tube'Left upper extremity weakness CVS- normal heart sounds Chest - clear lung fields Abdomen - soft Back -   Extremities - able to move RUE and bilateral lower extremities freely   Pertinent Microbiology Results for orders placed or performed during the hospital encounter of 12/10/20  Culture, blood (routine x 2)     Status: Abnormal   Collection Time: 12/10/20  3:50 AM   Specimen: BLOOD  Result Value Ref Range Status   Specimen Description BLOOD LEFT ANTECUBITAL  Final   Special Requests   Final    BOTTLES DRAWN AEROBIC AND ANAEROBIC Blood Culture adequate volume   Culture  Setup Time   Final    GRAM NEGATIVE RODS IN BOTH AEROBIC AND ANAEROBIC BOTTLES CRITICAL RESULT CALLED TO, READ BACK BY AND VERIFIED WITHJeanette Caprice [facility] [id number] 12/10/20 A BROWNING Performed at [facility], [address], [place], [place] [address]    Culture ESCHERICHIA COLI (A)  Final   Report Status 12/12/2020 FINAL  Final   Organism ID, Bacteria ESCHERICHIA COLI  Final      Susceptibility   Escherichia coli - MIC*    AMPICILLIN 8 SENSITIVE Sensitive      CEFAZOLIN <=4 SENSITIVE Sensitive     CEFEPIME <=0.12 SENSITIVE Sensitive     CEFTAZIDIME <=1 SENSITIVE Sensitive     CEFTRIAXONE <=0.25 SENSITIVE Sensitive     CIPROFLOXACIN <=0.25 SENSITIVE Sensitive     GENTAMICIN <=1 SENSITIVE Sensitive     IMIPENEM <=0.25 SENSITIVE Sensitive     TRIMETH/SULFA <=20 SENSITIVE Sensitive     AMPICILLIN/SULBACTAM <=2 SENSITIVE Sensitive     PIP/TAZO <=4 SENSITIVE Sensitive     * ESCHERICHIA COLI  Blood Culture ID Panel (Reflexed)     Status: Abnormal   Collection Time: 12/10/20  3:50 AM  Result Value Ref Range Status   Enterococcus faecalis NOT DETECTED NOT DETECTED Final   Enterococcus Faecium NOT DETECTED NOT DETECTED Final   Listeria monocytogenes NOT DETECTED NOT DETECTED Final   Staphylococcus species NOT DETECTED NOT DETECTED Final   Staphylococcus aureus (BCID) NOT DETECTED NOT DETECTED Final   Staphylococcus epidermidis NOT DETECTED NOT DETECTED Final   Staphylococcus lugdunensis NOT DETECTED NOT DETECTED Final   Streptococcus species NOT DETECTED NOT DETECTED Final   Streptococcus agalactiae NOT DETECTED NOT DETECTED Final   Streptococcus pneumoniae NOT DETECTED NOT DETECTED Final   Streptococcus pyogenes NOT DETECTED NOT DETECTED Final   A.calcoaceticus-baumannii NOT DETECTED NOT DETECTED Final   Bacteroides fragilis NOT DETECTED NOT DETECTED Final   Enterobacterales DETECTED (A) NOT DETECTED Final    Comment: Enterobacterales represent a large order of gram negative bacteria, not a single organism. CRITICAL RESULT CALLED TO, READ BACK BY AND VERIFIED WITH: Jeanette Caprice PHARMD [id number] 12/10/20 A BROWNING    Enterobacter cloacae complex NOT DETECTED NOT DETECTED Final   Escherichia coli DETECTED (A) NOT DETECTED Final    Comment: CRITICAL RESULT CALLED TO, READ BACK BY AND VERIFIED WITH: Jeanette Caprice PHARMD [id number] 12/10/20 A BROWNING    Klebsiella aerogenes NOT DETECTED NOT DETECTED Final   Klebsiella oxytoca NOT DETECTED NOT DETECTED Final    Klebsiella pneumoniae NOT DETECTED NOT DETECTED Final   Proteus species NOT DETECTED NOT DETECTED Final   Salmonella species NOT DETECTED NOT DETECTED Final   Serratia marcescens NOT DETECTED NOT DETECTED Final   Haemophilus influenzae NOT DETECTED NOT DETECTED Final   Neisseria meningitidis NOT DETECTED NOT DETECTED Final   Pseudomonas aeruginosa NOT DETECTED NOT DETECTED Final   Stenotrophomonas maltophilia NOT DETECTED NOT DETECTED Final   Candida albicans NOT DETECTED NOT DETECTED Final   Candida auris NOT DETECTED NOT DETECTED Final   Candida glabrata NOT DETECTED NOT DETECTED Final   Candida krusei NOT DETECTED NOT DETECTED Final   Candida parapsilosis NOT DETECTED NOT DETECTED Final   Candida tropicalis NOT DETECTED NOT DETECTED Final   Cryptococcus neoformans/gattii NOT DETECTED NOT DETECTED Final   CTX-M ESBL NOT DETECTED NOT DETECTED Final   Carbapenem resistance IMP NOT DETECTED NOT DETECTED Final   Carbapenem resistance KPC NOT DETECTED NOT DETECTED Final   Carbapenem resistance NDM NOT DETECTED NOT DETECTED Final   Carbapenem resist OXA 48 LIKE NOT DETECTED NOT DETECTED Final   Carbapenem resistance VIM NOT DETECTED NOT DETECTED Final    Comment: Performed at [facility], [address], [place], [place] [address]  Resp Panel by RT-PCR (Flu A&B, Covid) Nasopharyngeal Swab     Status: None  Collection Time: 12/10/20  3:59 AM   Specimen: Nasopharyngeal Swab; Nasopharyngeal(NP) swabs in vial transport medium  Result Value Ref Range Status   SARS Coronavirus 2 by RT PCR NEGATIVE NEGATIVE Final    Comment: (NOTE) SARS-CoV-2 target nucleic acids are NOT DETECTED.  The SARS-CoV-2 RNA is generally detectable in upper respiratory specimens during the acute phase of infection. The lowest concentration of SARS-CoV-2 viral copies this assay can detect is 138 copies/mL. A negative result does not preclude SARS-Cov-2 infection and should not be used as the sole basis for  treatment or other patient management decisions. A negative result may occur with  improper specimen collection/handling, submission of specimen other than nasopharyngeal swab, presence of viral mutation(s) within the areas targeted by this assay, and inadequate number of viral copies(<138 copies/mL). A negative result must be combined with clinical observations, patient history, and epidemiological information. The expected result is Negative.  Fact Sheet for Patients:  EntrepreneurPulse.com.au  Fact Sheet for Healthcare Providers:  IncredibleEmployment.be  This test is no t yet approved or cleared by the [place] FDA and  has been authorized for detection and/or diagnosis of SARS-CoV-2 by FDA under an Emergency Use Authorization (EUA). This EUA will remain  in effect (meaning this test can be used) for the duration of the COVID-19 declaration under Section 564(b)(1) of the Act, 21 U.S.C.section 360bbb-3(b)(1), unless the authorization is terminated  or revoked sooner.       Influenza A by PCR NEGATIVE NEGATIVE Final   Influenza B by PCR NEGATIVE NEGATIVE Final    Comment: (NOTE) The Xpert Xpress SARS-CoV-2/FLU/RSV plus assay is intended as an aid in the diagnosis of influenza from Nasopharyngeal swab specimens and should not be used as a sole basis for treatment. Nasal washings and aspirates are unacceptable for Xpert Xpress SARS-CoV-2/FLU/RSV testing.  Fact Sheet for Patients: EntrepreneurPulse.com.au  Fact Sheet for Healthcare Providers: IncredibleEmployment.be  This test is not yet approved or cleared by the [place] FDA and has been authorized for detection and/or diagnosis of SARS-CoV-2 by FDA under an Emergency Use Authorization (EUA). This EUA will remain in effect (meaning this test can be used) for the duration of the COVID-19 declaration under Section 564(b)(1) of the Act, 21  U.S.C. section 360bbb-3(b)(1), unless the authorization is terminated or revoked.  Performed at [facility], [place] [address], [place], [place] [address]   Culture, blood (routine x 2)     Status: Abnormal   Collection Time: 12/10/20  4:06 AM   Specimen: BLOOD RIGHT HAND  Result Value Ref Range Status   Specimen Description BLOOD RIGHT HAND  Final   Special Requests   Final    BOTTLES DRAWN AEROBIC AND ANAEROBIC Blood Culture results may not be optimal due to an inadequate volume of blood received in culture bottles   Culture  Setup Time   Final    GRAM NEGATIVE RODS IN BOTH AEROBIC AND ANAEROBIC BOTTLES IDENTIFICATION TO FOLLOW CRITICAL RESULT CALLED TO, READ BACK BY AND VERIFIED WITHJeanette Caprice PHARMD [id number] 12/10/20 A BROWNING    Culture (A)  Final    ESCHERICHIA COLI SUSCEPTIBILITIES PERFORMED ON PREVIOUS CULTURE WITHIN THE LAST 5 DAYS. Performed at [facility], [place] [address], [place], [place] [address]    Report Status 12/12/2020 FINAL  Final  Urine culture     Status: Abnormal   Collection Time: 12/10/20  4:40 AM   Specimen: Urine, Clean Catch  Result Value Ref Range Status   Specimen  Description URINE, CLEAN CATCH  Final   Special Requests   Final    NONE Performed at [facility], [place] [address], [place], [place] [address]    Culture >=100,000 COLONIES/mL ESCHERICHIA COLI (A)  Final   Report Status 12/12/2020 FINAL  Final   Organism ID, Bacteria ESCHERICHIA COLI (A)  Final      Susceptibility   Escherichia coli - MIC*    AMPICILLIN 8 SENSITIVE Sensitive     CEFAZOLIN <=4 SENSITIVE Sensitive     CEFEPIME <=0.12 SENSITIVE Sensitive     CEFTRIAXONE <=0.25 SENSITIVE Sensitive     CIPROFLOXACIN <=0.25 SENSITIVE Sensitive     GENTAMICIN <=1 SENSITIVE Sensitive     IMIPENEM <=0.25 SENSITIVE Sensitive     NITROFURANTOIN <=16 SENSITIVE Sensitive     TRIMETH/SULFA <=20 SENSITIVE Sensitive     AMPICILLIN/SULBACTAM <=2 SENSITIVE Sensitive     PIP/TAZO <=4  SENSITIVE Sensitive     * >=100,000 COLONIES/mL ESCHERICHIA COLI  MRSA PCR Screening     Status: None   Collection Time: 12/10/20 11:24 AM   Specimen: Nasal Mucosa; Nasopharyngeal  Result Value Ref Range Status   MRSA by PCR NEGATIVE NEGATIVE Final    Comment:        The GeneXpert MRSA Assay (FDA approved for NASAL specimens only), is one component of a comprehensive MRSA colonization surveillance program. It is not intended to diagnose MRSA infection nor to guide or monitor treatment for MRSA infections. Performed at [facility], [place] [address], [place], [place] [address]   Culture, Respiratory w Gram Stain     Status: None   Collection Time: 12/15/20  7:39 AM   Specimen: Tracheal Aspirate; Respiratory  Result Value Ref Range Status   Specimen Description TRACHEAL ASPIRATE  Final   Special Requests NONE  Final   Gram Stain   Final    RARE WBC PRESENT, PREDOMINANTLY PMN MODERATE GRAM NEGATIVE RODS RARE GRAM POSITIVE COCCI    Culture   Final    RARE Consistent with normal respiratory flora. No Pseudomonas species isolated Performed at [place] [address], [place], [place] [address]    Report Status 12/17/2020 FINAL  Final  Culture, BAL-quantitative w Gram Stain     Status: Abnormal   Collection Time: 12/15/20  9:36 AM   Specimen: Bronchoalveolar Lavage; Respiratory  Result Value Ref Range Status   Specimen Description BRONCHIAL ALVEOLAR LAVAGE  Final   Special Requests NONE  Final   Gram Stain   Final    FEW WBC PRESENT,BOTH PMN AND MONONUCLEAR NO ORGANISMS SEEN    Culture (A)  Final    3,000 COLONIES/mL Consistent with normal respiratory flora. Performed at [facility], [place] [address] [place], [place] [address]    Report Status 12/17/2020 FINAL  Final  Urine Culture     Status: None   Collection Time: 12/16/20  9:53 PM   Specimen: Urine, Cystoscope  Result Value Ref Range Status   Specimen Description URINE, RANDOM  Final   Special  Requests PT ON CEFATAN,CYSTO URINE  Final   Culture   Final    NO GROWTH Performed at [facility], [place] [address] [place], [place] [address]    Report Status 12/18/2020 FINAL  Final  Culture, Respiratory w Gram Stain     Status: None   Collection Time: 12/20/20 11:45 AM   Specimen: Tracheal Aspirate; Respiratory  Result Value Ref Range Status   Specimen Description  TRACHEAL ASPIRATE  Final   Special Requests NONE  Final   Gram Stain   Final    ABUNDANT WBC PRESENT, PREDOMINANTLY PMN FEW SQUAMOUS EPITHELIAL CELLS PRESENT MODERATE GRAM POSITIVE COCCI IN CLUSTERS RARE GRAM POSITIVE RODS RARE GRAM NEGATIVE RODS Performed at [facility], [place] [address], [place], [place] [address]    Culture FEW STAPHYLOCOCCUS AUREUS  Final   Report Status 12/24/2020 FINAL  Final   Organism ID, Bacteria STAPHYLOCOCCUS AUREUS  Final      Susceptibility   Staphylococcus aureus - MIC*    CIPROFLOXACIN <=0.5 SENSITIVE Sensitive     ERYTHROMYCIN <=0.25 SENSITIVE Sensitive     GENTAMICIN <=0.5 SENSITIVE Sensitive     OXACILLIN 0.5 SENSITIVE Sensitive     TETRACYCLINE <=1 SENSITIVE Sensitive     VANCOMYCIN <=0.5 SENSITIVE Sensitive     TRIMETH/SULFA <=10 SENSITIVE Sensitive     CLINDAMYCIN <=0.25 SENSITIVE Sensitive     RIFAMPIN <=0.5 SENSITIVE Sensitive     Inducible Clindamycin NEGATIVE Sensitive     * FEW STAPHYLOCOCCUS AUREUS  Culture, blood (Routine X 2) w Reflex to ID Panel     Status: Abnormal   Collection Time: 12/25/20  8:07 AM   Specimen: BLOOD LEFT HAND  Result Value Ref Range Status   Specimen Description BLOOD LEFT HAND  Final   Special Requests   Final    BOTTLES DRAWN AEROBIC ONLY Blood Culture results may not be optimal due to an inadequate volume of blood received in culture bottles   Culture  Setup Time   Final    GRAM POSITIVE COCCI IN CLUSTERS AEROBIC BOTTLE ONLY    Culture (A)  Final    STAPHYLOCOCCUS EPIDERMIDIS SUSCEPTIBILITIES PERFORMED ON PREVIOUS CULTURE  WITHIN THE LAST 5 DAYS. Performed at [facility], [place] [address], [place], [place] [address]    Report Status 12/27/2020 FINAL  Final  Culture, blood (Routine X 2) w Reflex to ID Panel     Status: Abnormal   Collection Time: 12/25/20  8:12 AM   Specimen: BLOOD RIGHT HAND  Result Value Ref Range Status   Specimen Description BLOOD RIGHT HAND  Final   Special Requests   Final    BOTTLES DRAWN AEROBIC ONLY Blood Culture results may not be optimal due to an inadequate volume of blood received in culture bottles   Culture  Setup Time   Final    GRAM POSITIVE COCCI IN CLUSTERS AEROBIC BOTTLE ONLY CRITICAL RESULT CALLED TO, READ BACK BY AND VERIFIED WITH: PHARMD E.WOLFE AT 7681 ON 12/25/2020 BY T.SAAD Performed at [facility], [place] [address] [place], [place] [address]    Culture STAPHYLOCOCCUS EPIDERMIDIS (A)  Final   Report Status 12/27/2020 FINAL  Final   Organism ID, Bacteria STAPHYLOCOCCUS EPIDERMIDIS  Final      Susceptibility   Staphylococcus epidermidis - MIC*    CIPROFLOXACIN >=8 RESISTANT Resistant     ERYTHROMYCIN >=8 RESISTANT Resistant     GENTAMICIN 8 INTERMEDIATE Intermediate     OXACILLIN >=4 RESISTANT Resistant     TETRACYCLINE 2 SENSITIVE Sensitive     VANCOMYCIN 1 SENSITIVE Sensitive     TRIMETH/SULFA 80 RESISTANT Resistant     CLINDAMYCIN >=8 RESISTANT Resistant     RIFAMPIN <=0.5 SENSITIVE Sensitive     Inducible Clindamycin NEGATIVE Sensitive     * STAPHYLOCOCCUS EPIDERMIDIS  Blood Culture ID Panel (Reflexed)     Status: Abnormal   Collection Time: 12/25/20  8:12 AM  Result Value Ref Range Status   Enterococcus faecalis NOT DETECTED NOT DETECTED Final   Enterococcus Faecium NOT DETECTED NOT DETECTED Final   Listeria monocytogenes NOT DETECTED NOT DETECTED Final   Staphylococcus species DETECTED (A) NOT DETECTED Final    Comment: CRITICAL RESULT CALLED TO, READ BACK BY AND VERIFIED WITH: PHARMD E.WOLFE AT 1256 ON 12/25/2020 BY T.SAAD.     Staphylococcus aureus (BCID) NOT DETECTED NOT DETECTED Final   Staphylococcus epidermidis DETECTED (A) NOT DETECTED Final    Comment: Methicillin (oxacillin) resistant coagulase negative staphylococcus. Possible blood culture contaminant (unless isolated from more than one blood culture draw or clinical case suggests pathogenicity). No antibiotic treatment is indicated for blood  culture contaminants.    Staphylococcus lugdunensis NOT DETECTED NOT DETECTED Final   Streptococcus species NOT DETECTED NOT DETECTED Final   Streptococcus agalactiae NOT DETECTED NOT DETECTED Final   Streptococcus pneumoniae NOT DETECTED NOT DETECTED Final   Streptococcus pyogenes NOT DETECTED NOT DETECTED Final   A.calcoaceticus-baumannii NOT DETECTED NOT DETECTED Final   Bacteroides fragilis NOT DETECTED NOT DETECTED Final   Enterobacterales NOT DETECTED NOT DETECTED Final   Enterobacter cloacae complex NOT DETECTED NOT DETECTED Final   Escherichia coli NOT DETECTED NOT DETECTED Final   Klebsiella aerogenes NOT DETECTED NOT DETECTED Final   Klebsiella oxytoca NOT DETECTED NOT DETECTED Final   Klebsiella pneumoniae NOT DETECTED NOT DETECTED Final   Proteus species NOT DETECTED NOT DETECTED Final   Salmonella species NOT DETECTED NOT DETECTED Final   Serratia marcescens NOT DETECTED NOT DETECTED Final   Haemophilus influenzae NOT DETECTED NOT DETECTED Final   Neisseria meningitidis NOT DETECTED NOT DETECTED Final   Pseudomonas aeruginosa NOT DETECTED NOT DETECTED Final   Stenotrophomonas maltophilia NOT DETECTED NOT DETECTED Final   Candida albicans NOT DETECTED NOT DETECTED Final   Candida auris NOT DETECTED NOT DETECTED Final   Candida glabrata NOT DETECTED NOT DETECTED Final   Candida krusei NOT DETECTED NOT DETECTED Final   Candida parapsilosis NOT DETECTED NOT DETECTED Final   Candida tropicalis NOT DETECTED NOT DETECTED Final   Cryptococcus neoformans/gattii NOT DETECTED NOT DETECTED Final    Methicillin resistance mecA/C DETECTED (A) NOT DETECTED Final    Comment: CRITICAL RESULT CALLED TO, READ BACK BY AND VERIFIED WITH: PHARMD E.WOLFE AT 1256 ON 12/25/2020 BY T.SAAD. Performed at [facility], [place] [address] [place], [place] [address]   Culture, blood (routine x 2)     Status: None   Collection Time: 12/26/20  5:43 AM   Specimen: BLOOD  Result Value Ref Range Status   Specimen Description BLOOD RIGHT ANTECUBITAL  Final   Special Requests   Final    BOTTLES DRAWN AEROBIC AND ANAEROBIC Blood Culture adequate volume   Culture   Final    NO GROWTH 5 DAYS Performed at [facility], [place] [address] [place], [place] [address]    Report Status 12/31/2020 FINAL  Final  Culture, blood (routine x 2)     Status: None   Collection Time: 12/26/20  5:43 AM   Specimen: BLOOD RIGHT HAND  Result Value Ref Range Status   Specimen Description BLOOD RIGHT HAND  Final   Special Requests   Final    BOTTLES DRAWN AEROBIC ONLY Blood Culture results may not be optimal due to an inadequate volume of blood received in culture bottles   Culture   Final    NO GROWTH 5 DAYS Performed at [facility], [place]  [address], [place], [place] [address]    Report Status 12/31/2020 FINAL  Final  Culture, Urine     Status: None   Collection Time: 01/02/21  4:06 AM   Specimen: Urine, Random  Result Value Ref Range Status   Specimen Description URINE, RANDOM  Final   Special Requests NONE  Final   Culture   Final    NO GROWTH Performed at [facility], [place] [address] [place], [place] [address]    Report Status 01/03/2021 FINAL  Final  Culture, blood (routine x 2)     Status: Abnormal   Collection Time: 01/02/21  4:19 AM   Specimen: BLOOD LEFT HAND  Result Value Ref Range Status   Specimen Description BLOOD LEFT HAND  Final   Special Requests AEROBIC BOTTLE ONLY Blood Culture adequate volume  Final   Culture  Setup Time   Final    GRAM POSITIVE COCCI IN CLUSTERS AEROBIC  BOTTLE ONLY CRITICAL VALUE NOTED.  VALUE IS CONSISTENT WITH PREVIOUSLY REPORTED AND CALLED VALUE.    Culture (A)  Final    STAPHYLOCOCCUS EPIDERMIDIS SUSCEPTIBILITIES PERFORMED ON PREVIOUS CULTURE WITHIN THE LAST 5 DAYS. Performed at [facility], [place] [address] [place], [place] [address]    Report Status 01/05/2021 FINAL  Final  Culture, blood (routine x 2)     Status: Abnormal   Collection Time: 01/02/21  4:32 AM   Specimen: BLOOD LEFT HAND  Result Value Ref Range Status   Specimen Description BLOOD LEFT HAND  Final   Special Requests   Final    BOTTLES DRAWN AEROBIC AND ANAEROBIC Blood Culture results may not be optimal due to an inadequate volume of blood received in culture bottles   Culture  Setup Time   Final    GRAM POSITIVE COCCI ANAEROBIC BOTTLE ONLY CRITICAL RESULT CALLED TO, READ BACK BY AND VERIFIED WITH: PHARMD GREG ABBOTT 01/03/2021 AT 0630 A.HUGHES Performed at [facility], [place] [address], [place], [place] [address]    Culture STAPHYLOCOCCUS EPIDERMIDIS (A)  Final   Report Status 01/05/2021 FINAL  Final   Organism ID, Bacteria STAPHYLOCOCCUS EPIDERMIDIS  Final      Susceptibility   Staphylococcus epidermidis - MIC*    CIPROFLOXACIN >=8 RESISTANT Resistant     ERYTHROMYCIN >=8 RESISTANT Resistant     GENTAMICIN >=16 RESISTANT Resistant     OXACILLIN >=4 RESISTANT Resistant     TETRACYCLINE 2 SENSITIVE Sensitive     VANCOMYCIN 1 SENSITIVE Sensitive     TRIMETH/SULFA 80 RESISTANT Resistant     CLINDAMYCIN >=8 RESISTANT Resistant     RIFAMPIN <=0.5 SENSITIVE Sensitive     Inducible Clindamycin NEGATIVE Sensitive     * STAPHYLOCOCCUS EPIDERMIDIS  Blood Culture ID Panel (Reflexed)     Status: Abnormal   Collection Time: 01/02/21  4:32 AM  Result Value Ref Range Status   Enterococcus faecalis NOT DETECTED NOT DETECTED Final   Enterococcus Faecium NOT DETECTED NOT DETECTED Final   Listeria monocytogenes NOT DETECTED NOT DETECTED Final    Staphylococcus species DETECTED (A) NOT DETECTED Final    Comment: CRITICAL RESULT CALLED TO, READ BACK BY AND VERIFIED WITH: PHARMD GREG ABBOTT 01/03/2021 AT 0630 A.HUGHES    Staphylococcus aureus (BCID) NOT DETECTED NOT DETECTED Final   Staphylococcus epidermidis DETECTED (A) NOT DETECTED Final    Comment: Methicillin (oxacillin) resistant coagulase negative staphylococcus. Possible blood culture contaminant (unless isolated from more than one blood culture draw or clinical case suggests pathogenicity). No  antibiotic treatment is indicated for blood  culture contaminants. CRITICAL RESULT CALLED TO, READ BACK BY AND VERIFIED WITH: PHARMD GREG ABBOTT 01/03/2021 AT 0630 A.HUGHES    Staphylococcus lugdunensis NOT DETECTED NOT DETECTED Final   Streptococcus species NOT DETECTED NOT DETECTED Final   Streptococcus agalactiae NOT DETECTED NOT DETECTED Final   Streptococcus pneumoniae NOT DETECTED NOT DETECTED Final   Streptococcus pyogenes NOT DETECTED NOT DETECTED Final   A.calcoaceticus-baumannii NOT DETECTED NOT DETECTED Final   Bacteroides fragilis NOT DETECTED NOT DETECTED Final   Enterobacterales NOT DETECTED NOT DETECTED Final   Enterobacter cloacae complex NOT DETECTED NOT DETECTED Final   Escherichia coli NOT DETECTED NOT DETECTED Final   Klebsiella aerogenes NOT DETECTED NOT DETECTED Final   Klebsiella oxytoca NOT DETECTED NOT DETECTED Final   Klebsiella pneumoniae NOT DETECTED NOT DETECTED Final   Proteus species NOT DETECTED NOT DETECTED Final   Salmonella species NOT DETECTED NOT DETECTED Final   Serratia marcescens NOT DETECTED NOT DETECTED Final   Haemophilus influenzae NOT DETECTED NOT DETECTED Final   Neisseria meningitidis NOT DETECTED NOT DETECTED Final   Pseudomonas aeruginosa NOT DETECTED NOT DETECTED Final   Stenotrophomonas maltophilia NOT DETECTED NOT DETECTED Final   Candida albicans NOT DETECTED NOT DETECTED Final   Candida auris NOT DETECTED NOT DETECTED Final    Candida glabrata NOT DETECTED NOT DETECTED Final   Candida krusei NOT DETECTED NOT DETECTED Final   Candida parapsilosis NOT DETECTED NOT DETECTED Final   Candida tropicalis NOT DETECTED NOT DETECTED Final   Cryptococcus neoformans/gattii NOT DETECTED NOT DETECTED Final   Methicillin resistance mecA/C DETECTED (A) NOT DETECTED Final    Comment: CRITICAL RESULT CALLED TO, READ BACK BY AND VERIFIED WITH: PHARMD GREG ABBOTT 01/03/2021 AT 0630 A.HUGHES Performed at [facility], [place] [address], [place], [place] [address]   Culture, blood (Routine X 2) w Reflex to ID Panel     Status: Abnormal (Preliminary result)   Collection Time: 01/03/21 11:31 AM   Specimen: BLOOD LEFT HAND  Result Value Ref Range Status   Specimen Description BLOOD LEFT HAND  Final   Special Requests   Final    BOTTLES DRAWN AEROBIC ONLY Blood Culture adequate volume   Culture  Setup Time   Final    GRAM NEGATIVE RODS AEROBIC BOTTLE ONLY PHARMD GREG ABBOTT 01/04/2021 AT 0425 A.HUGHES CRITICAL VALUE NOTED.  VALUE IS CONSISTENT WITH PREVIOUSLY REPORTED AND CALLED VALUE. Performed at [facility], [place] [address], [place], [place] [address]    Culture ESCHERICHIA COLI (A)  Final   Report Status PENDING  Incomplete  Culture, blood (Routine X 2) w Reflex to ID Panel     Status: Abnormal (Preliminary result)   Collection Time: 01/03/21 11:31 AM   Specimen: BLOOD LEFT FOREARM  Result Value Ref Range Status   Specimen Description BLOOD LEFT FOREARM  Final   Special Requests   Final    BOTTLES DRAWN AEROBIC ONLY Blood Culture adequate volume   Culture  Setup Time   Final    GRAM NEGATIVE RODS AEROBIC BOTTLE ONLY CRITICAL RESULT CALLED TO, READ BACK BY AND VERIFIED WITH: PHARMD GREG ABBOTT 01/04/2021 AT 0425 A.HUGHES    Culture (A)  Final    ESCHERICHIA COLI SUSCEPTIBILITIES TO FOLLOW Performed at [facility], [place] [address], [place], [place] [address]    Report Status PENDING  Incomplete  Blood  Culture ID Panel (Reflexed)     Status: Abnormal   Collection Time:  01/03/21 11:31 AM  Result Value Ref Range Status   Enterococcus faecalis NOT DETECTED NOT DETECTED Final   Enterococcus Faecium NOT DETECTED NOT DETECTED Final   Listeria monocytogenes NOT DETECTED NOT DETECTED Final   Staphylococcus species NOT DETECTED NOT DETECTED Final   Staphylococcus aureus (BCID) NOT DETECTED NOT DETECTED Final   Staphylococcus epidermidis NOT DETECTED NOT DETECTED Final   Staphylococcus lugdunensis NOT DETECTED NOT DETECTED Final   Streptococcus species NOT DETECTED NOT DETECTED Final   Streptococcus agalactiae NOT DETECTED NOT DETECTED Final   Streptococcus pneumoniae NOT DETECTED NOT DETECTED Final   Streptococcus pyogenes NOT DETECTED NOT DETECTED Final   A.calcoaceticus-baumannii NOT DETECTED NOT DETECTED Final   Bacteroides fragilis NOT DETECTED NOT DETECTED Final   Enterobacterales DETECTED (A) NOT DETECTED Final    Comment: Enterobacterales represent a large order of gram negative bacteria, not a single organism. CRITICAL RESULT CALLED TO, READ BACK BY AND VERIFIED WITH: PHARMD GREG ABBOTT 01/04/2021 AT 0425 A.HUGHES    Enterobacter cloacae complex NOT DETECTED NOT DETECTED Final   Escherichia coli DETECTED (A) NOT DETECTED Final    Comment: CRITICAL RESULT CALLED TO, READ BACK BY AND VERIFIED WITH: PHARMD GREG ABBOTT 01/04/2021 AT 0425 A.HUGHES    Klebsiella aerogenes NOT DETECTED NOT DETECTED Final   Klebsiella oxytoca NOT DETECTED NOT DETECTED Final   Klebsiella pneumoniae NOT DETECTED NOT DETECTED Final   Proteus species NOT DETECTED NOT DETECTED Final   Salmonella species NOT DETECTED NOT DETECTED Final   Serratia marcescens NOT DETECTED NOT DETECTED Final   Haemophilus influenzae NOT DETECTED NOT DETECTED Final   Neisseria meningitidis NOT DETECTED NOT DETECTED Final   Pseudomonas aeruginosa NOT DETECTED NOT DETECTED Final   Stenotrophomonas maltophilia NOT DETECTED NOT  DETECTED Final   Candida albicans NOT DETECTED NOT DETECTED Final   Candida auris NOT DETECTED NOT DETECTED Final   Candida glabrata NOT DETECTED NOT DETECTED Final   Candida krusei NOT DETECTED NOT DETECTED Final   Candida parapsilosis NOT DETECTED NOT DETECTED Final   Candida tropicalis NOT DETECTED NOT DETECTED Final   Cryptococcus neoformans/gattii NOT DETECTED NOT DETECTED Final   CTX-M ESBL NOT DETECTED NOT DETECTED Final   Carbapenem resistance IMP NOT DETECTED NOT DETECTED Final   Carbapenem resistance KPC NOT DETECTED NOT DETECTED Final   Carbapenem resistance NDM NOT DETECTED NOT DETECTED Final   Carbapenem resist OXA 48 LIKE NOT DETECTED NOT DETECTED Final   Carbapenem resistance VIM NOT DETECTED NOT DETECTED Final    Comment: Performed at [facility], [address] [place], [place] [address]    Pertinent Lab. CBC Latest Ref Rng & Units 01/06/2021 01/05/2021 01/04/2021  WBC 4.0 - 10.5 K/uL 13.9(H) 11.7(H) 11.7(H)  Hemoglobin 13.0 - 17.0 g/dL 9.6(L) 9.7(L) 9.5(L)  Hematocrit 39.0 - 52.0 % 31.2(L) 30.5(L) 29.9(L)  Platelets 150 - 400 K/uL 201 186 190   CMP Latest Ref Rng & Units 01/06/2021 01/05/2021 01/04/2021  Glucose 70 - 99 mg/dL 182(H) 136(H) 106(H)  BUN 6 - 20 mg/dL 52(H) 56(H) 57(H)  Creatinine 0.61 - 1.24 mg/dL 1.00 1.03 0.99  Sodium 135 - 145 mmol/L 146(H) 146(H) 144  Potassium 3.5 - 5.1 mmol/L 3.8 3.7 3.4(L)  Chloride 98 - 111 mmol/L 112(H) 111 108  CO2 22 - 32 mmol/L _0 Calcium 8.9 - 10.3 mg/dL 8.8(L) 8.7(L) 8.7(L)  Total Protein 6.5 - 8.1 g/dL - - -  Total Bilirubin 0.3 - 1.2 mg/dL - - -  Alkaline Phos 38 -  126 U/L - - -  AST 15 - 41 U/L - - -  ALT 0 - 44 U/L - - -     Pertinent Imaging today Plain films and CT images have been personally visualized and interpreted; radiology reports have been reviewed. Decision making incorporated into the Impression / Recommendations.  I have spent approx 30 minutes for this patient encounter including  review of prior medical records, coordination of care  with greater than 50% of time being face to face/counseling and discussing diagnostics/treatment plan with the patient/family.  Electronically signed by:   Rosiland Oz, MD Infectious Disease Physician [facility] Pager: [phone]

## 2021-01-06 NOTE — Evaluation (Signed)
Speech Language Pathology Evaluation Patient Details Name: [name] MRN: [id number] DOB: Nov 28, 1960 Today's Date: 01/06/2021 Time: 0915-1000 SLP Time Calculation (min) (ACUTE ONLY): 45 min  Problem List:  Patient Active Problem List   Diagnosis Date Noted  . Pressure injury of skin 01/04/2021  . Staphylococcus epidermidis bacteremia 12/25/2020  . 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 AKI (acute kidney injury) (HCC)   . Bacteremia due to Escherichia coli   . Ureteral stone   . Pyelonephritis   . Acute respiratory failure (HCC) 12/10/2020  . Sepsis with acute renal failure and septic shock [facility])    Past Medical History: No past medical history on file. Past Surgical History:  Past Surgical History:  Procedure Laterality Date  . CYSTOSCOPY W/ URETERAL STENT PLACEMENT Left 12/16/2020   Procedure: CYSTOSCOPY WITH RETROGRADE PYELOGRAM/URETERAL STENT PLACEMENT;  Surgeon: Alfredo Martinez, MD;  Location: [facility] OR;  Service: Urology;  Laterality: Left;   HPI:  Pt is a 60 year old male who presented 3/31 to the ED with 4-5 day hx of diarrhea and vomiting. He developed confusion and became belligerent at home. Initial ED evaluation found him to be hypotensive, combative, confused, and he was intubated.  He was noted to have mottling over his trunk and extremities and was  admitted for hypoxemic respiratory failure and septic shock 2/2 E. Coli bacteremia and UTI, and ATN. ETT 3/31-4/5; reintubated 4/5-4/16. 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 CXR 4/14: Worsening pneumonia in both lungs, most confluent at the RIGHT  lung base. CXR 4/22: Mild residual airspace disease in the right mid to lower lung.  Aeration slightly improved. No new airspace opacity   Assessment / Plan / Recommendation Clinical Impression  Initiated speech evaluation given severe dysarthria and dysphonia observed impacting  pts ability to express wants and needs. Pt has severely dry oral mucosa given mouth breathing and minimal PO intake. Almost no ability to articulate initially; after oral care pt initiated some efforts to articulate with minimal lingual movement noted. Pt does not always recruit mandibular movement with articulation given open mouth breathing. SLP offered Y/N and binary choices to express wants and needs intelligibly and pt able to repeat his choices with a model. With verbal cueing pt both increased breath support for dysphonic phonation and lingual and labial movement for speech. Also challenged by pain, lethargy and brief focused attention to tasks. Will continue efforts to facilitate functional communication. Pt may benefit from humidified air even if he may not need supplemental oxygen. Pain management will be a barrier to progress.    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.1)    Follow Up Recommendations  LTACH    Frequency and Duration min 2x/week  2 weeks      SLP Evaluation Cognition  Overall Cognitive Status: Impaired/Different from baseline Arousal/Alertness: Lethargic Orientation Level: Oriented to person;Other (comment) (pt unable to state/respond) Attention: Focused;Sustained Focused Attention: Appears intact Sustained Attention: Impaired Sustained Attention Impairment: Verbal basic;Functional basic Awareness: Impaired Awareness Impairment: Intellectual impairment       Comprehension  Auditory Comprehension Overall Auditory Comprehension: Impaired Yes/No Questions: Impaired Basic Biographical Questions: 51-75% accurate Commands: Impaired One Step Basic Commands: 25-49% accurate Interfering Components: Pain;Attention EffectiveTechniques: Repetition;Visual/Gestural cues;Increased volume Reading Comprehension Reading Status: Not tested     Expression Verbal Expression Overall Verbal Expression: Other (comment) (UTA due to pain, dysarthria, dysphonia)  Oral / Motor  Oral Motor/Sensory Function Overall Oral Motor/Sensory Function: Severe impairment Lingual ROM: Reduced left Lingual Strength: Reduced Lingual Sensation: Reduced Motor Speech Overall Motor Speech: Impaired Respiration: Impaired Level of Impairment: Word Phonation: Aphonic Articulation: Impaired Level of Impairment: Word Intelligibility: Intelligibility reduced Word: 0-24% accurate Phrase: Not tested Sentence: Not tested Conversation: Not tested Motor Planning: Witnin functional limits Motor Speech Errors: Aware   GO                   Harlon Ditty, MA CCC-SLP  Acute Rehabilitation Services Pager [phone] Office [phone]  Claudine Mouton 01/06/2021, 1:08 PM

## 2021-01-06 NOTE — Progress Notes (Signed)
ANTICOAGULATION& ANTIBIOTIC CONSULT NOTE - Follow Up Consult  Pharmacy Consult for Heparin>>Lovenox Indication: Left IJ and subclavian non-occlusive clots  Labs: Recent Labs    01/04/21 0247 01/05/21 0622 01/05/21 0800 01/06/21 0447  HGB 9.5*  --  9.7* 9.6*  HCT 29.9*  --  30.5* 31.2*  PLT 190  --  186 201  CREATININE 0.99 1.03  --  1.00  CKTOTAL  --   --   --  129   Assessment: 60 year old male admitted for hypoxemic respiratory failure and septic shock 2/2 E. Coli bacteremia and UTI, and ATN. Now with Left IJ and subclavian non-occlusive clots.  Pharmacy consulted to start heparin drip. Heparin dosing weight of 113 kg.   Pt is now on full dose lovenox since 4/20. hgb/plt remain stable  Goal of Therapy:  Anti-xa - 0.6-1 Monitor platelets by anticoagulation protocol: Yes   Plan:  Continue Lovenox 120mg  SQ q12 F/u with oral [facility]  [facility], PharmD, BCIDP, AAHIVP, CPP Infectious Disease Pharmacist 01/06/2021 9:39 AM

## 2021-01-06 NOTE — Progress Notes (Signed)
PROGRESS NOTE    [name]  RUE:[id number] DOB: 1961/06/04 DOA: 12/10/2020 PCP: Pcp, No   Chief Complaint  Patient presents with  . Altered Mental Status  Brief Narrative: 60 YO male w/ hypertension, hyperlipidemia presented to the ED 3/31 from home with for 5 days history of diarrhea nausea vomiting and confusion.  Patient was admitted for acute hypoxic aspiratory failure with septic shock due to E. coli bacteremia and UTI and ATN needing intubation pressor support sedation with Precedex/Versed. Patient had prolonged hospitalization complicated course (see previous ICU/transfer note) was managed by critical care, infectious disease for E. coli pyelonephritis complicated by hydronephrosis requiring placement of a stent and bacteremia then MSSA pneumonia,   MRSE endovascular infection, acute hypoxic/hypercapnic respiratory failure/acute toxic metabolic encephalopathy, AKI, left IJ/subclavian DVT versus septic thrombus-managed with Lovenox and ongoing fever/tachycardia.. Patient transferred to [facility] on 01/01/2021-with ongoing confusion/altered mental status and mentation slowly improving, ongoing tachycardia, fevers episodes Cortrack NG feeding, PICC line.  Intermittently febrile, tachycardic tachypneic due to ongoing bacteremia. Vanco trough and peak level subtherapeutic 4/24.due to persistent bacteremia PICC line has been removed -4/24, repeat blood culture sent 4/24- and ceftriaxone added. Duplex UE/LE ordered again 4/25-no  thrombosis in bilateral upper extremities and DVT in the left IJ and subclavian vein appears to have resolved compared to previous study, no DVT in lower extremities, evidence of acute superficial vein thrombosis from varicose vein of the medial right calf. Underwent repeat CT abdomen pelvis and CT head no acute finding.  Antibiotics changed to daptomycin due to subtherapeutic Vanco level 4/26  Subjective:  Seen this morning able to follow some commands, appears confused able  to whisper some/barely speaking Right arm understand, weakness in the left upper extremity.  Moving lower extremities. Temperature curve has been overall stable [place] spike of fever this morning 100.9 T-max Is doing well on room air tachycardia better Leukocytosis 13.9K  Assessment & Plan:  MRSE endovascular infection: TTE no acute finding repeat CT abdomen pelvis 4/26 unremarkable.  Last blood culture on 4/22 still MRSA positive, repeat blood culture 4/25 in process.Vanco trough and peak level subtherapeutic 4/24. picc removed -4/24. Duplex UE/LE 4/25-no  thrombosis in bilateral upper extremities and DVT in the left IJ and subclavian vein appears to have resolved compared to previous study, no DVT in lower extremities, evidence of acute superficial vein thrombosis from varicose vein of the medial right calf.  Big sacral wound likely contributing to some of the fever/infection.  Repeat CT abdomen pelvis 4/26-double-J stent stable in left kidney.Vancomycin switched to daptomycin 4/26  Flagyl added 4/27, on Ceftriaxone too.  Appreciate ID input on board, ?TEE need, defer to ID.  Recurrent E. coli bacteremia/E. coli pyelonephritis, repeat blood culture positive with E. coli bacteremia 4/24, repeat blood culture 4/25 pending.  Ceftriaxone was added .  Left renal double-J stent is stable in position on CT abdomen 4/26.  MSSA pneumonia. abx as above.  Tachycardia-heart rate appears stable continue Coreg.  Continue tube feeding 75 cc/h plus free water 200 mL every 4 hours, overall heart rate appears improving.   Septic shock multifactorial 2/2 E. coli pyelonephritis, MSSA pneumonia, MRSE bacteremia with left IJ/[place] septic thrombophlebitis.  Off pressors and transferred out of ICU.  Fever Subsiding antibiotics being managed as above.  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  Acute hypoxic/hypercapnic respiratory failure with MSSA pneumonia: Initially intubated, extubated 4/16.  PCCM recommends  BiPAP at bedtime has been needing high flow nasal cannula at 8 L-he was able to come off  oxygen 4/24.  PCCM reviewed the chart and signed off 4/25.  Continue pulmonary support.   Mild hypernatremia continue free water replacement with tube feed.  Monitor  AKI in the setting of sepsis/ATN/pyelonephritis with hydronephrosis status post left ureteral stent 4/6, repeat ultrasound showed mild left hydronephrosis.  CT abdomen stable position of double-J stent. It has resolved since.  Continue tube feeding. He has a Foley in place due to sacral wound. 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  Able to verbalize more follow commands.  Right wrist to remain on restraint , and has weakness in left upper arm.  Able to move bilateral lower legs.  For stroke continue aspirin 81, Lipitor.  Lipid panel 4/12 showed HDL 30 and LDL 83.  Due to ongoing altered mental status agitation CT head repeated 4/26 morning -no new acute finding. Added Seroquel at bedtime to help with agitation and regulate sleep cycle  Essential hypertension BP well controlled -switched amlodipine to Coreg-persistent tachycardia, monitor and adjust Coreg.    Diabetes mellitus type II on glipizide prior to admission him will HBA1c 6.8.blood sugar is controlled on Lantus 35 u bid, novolog 4 units and q4ssi while on tube feeding.  Continue to monitor CBG. Recent Labs  Lab 01/05/21 1540 01/05/21 1958 01/05/21 2325 01/06/21 0327 01/06/21 0749  GLUCAP 179* 136* 149* 168* 133*   Left IJ/subclavian DVT versus septic thrombus: Appears resolved on repeat duplex 4/25. Pt remains on SQ Lovenox therapeutic dose by PCCM team. PICC line has  been removed 4/24 RUE.  Chronic microcytic anemia hemoglobin is stable transfuse if less than 7 g  QTC prolonged:Avoid QT prolonging medication maintain electrolytes mag above 2 potassium above 4 and monitor.  Abdominal aortic aneurysm 4.8 cm advised 6 months followed by radiology  Morbid obesity with BMI 35.8.  Would benefit weight loss.  Patient has a deep tissue injury sacral wound on hydrotherapy  Goals of care: Patient with complicated and prolonged hospitalization with ongoing bacteremia from multiple organisms, with other issues including diabetes liver cirrhosis, a stroke and ongoing encephalopathy, prognosis appears guarded at this time.  Palliative care consultation has been requested to follow along. Remains full code.  I had called and explained to the wife about his overall poor clinical condition. Monitor closely at risk of decompensation.  Diet Order            DIET - DYS 1 Room service appropriate? No; Fluid consistency: Honey Thick  Diet effective now                 Nutrition Problem: Increased nutrient needs Etiology: wound healing Signs/Symptoms: estimated needs Interventions: Tube feeding Patient's Body mass index is 34.14 kg/m.  Pressure Injury 12/21/20 Buttocks Right Deep Tissue Pressure Injury - Purple or maroon localized area of discolored intact skin or blood-filled blister due to damage of underlying soft tissue from pressure and/or shear. purple with blister area that has  (Active)  12/21/20 1000  Location: Buttocks  Location Orientation: Right  Staging: Deep Tissue Pressure Injury - Purple or maroon localized area of discolored intact skin or blood-filled blister due to damage of underlying soft tissue from pressure and/or shear.  Wound Description (Comments): purple with blister area that  has torn  Present on Admission: No     Pressure Injury 12/21/20 Foot Left;Lateral Deep Tissue Pressure Injury - Purple or maroon localized area of discolored  intact skin or blood-filled blister due to damage of underlying soft tissue from pressure and/or shear. .5 cm purple area (Active)  12/21/20 0800  Location: Foot  Location Orientation: Left;Lateral  Staging: Deep Tissue Pressure Injury - Purple or maroon localized area of discolored intact skin or blood-filled blister due to damage of underlying soft tissue from pressure and/or shear.  Wound Description (Comments): .5 cm purple area  Present on Admission: No     Pressure Injury 01/04/21 Buttocks Posterior;Right;Mid Stage 3 -  Full thickness tissue loss. Subcutaneous fat may be visible but bone, tendon or muscle are NOT exposed. medial buttocks where rectal tube laid. (tube removed) (Active)  01/04/21 0800  Location: Buttocks  Location Orientation: Posterior;Right;Mid  Staging: Stage 3 -  Full thickness tissue loss. Subcutaneous fat may be visible but bone, tendon or muscle are NOT exposed.  Wound Description (Comments): medial buttocks where rectal tube laid. (tube removed)  Present on Admission: No   DVT prophylaxis: SCDs Start: 12/10/20 0737 Lovenox Code Status:   Code Status: Full Code  Family Communication: plan of care discussed with patient wife over the phone multiple times.   Prognosis is guarded, wife was updated extensively. Status OH:[id number] Remains inpatient appropriate because:Altered mental status, IV treatments appropriate due to intensity of illness or inability to take PO and Inpatient level of care appropriate due to severity of illness  Dispo: The patient is from: Home              Anticipated d/c is to: tbd              Patient currently is not medically stable to d/c.   Difficult to place patient No Unresulted Labs (From admission, onward)          Start     Ordered   01/06/21 0500  CK  Weekly,   R     Question:  Specimen collection method  Answer:  Lab=Lab collect   01/05/21 0837   01/06/21 0500  Basic metabolic panel  Daily,   R     Question:  Specimen  collection method  Answer:  Lab=Lab collect   01/05/21 0901   01/06/21 0500  CBC  Daily,   R     Question:  Specimen collection method  Answer:  Lab=Lab collect   01/05/21 0901   12/31/20 1051  Culture, Respiratory w Gram Stain  Once,   R        12/31/20 1050          Medications reviewed:  Scheduled Meds: . acetaminophen  650 mg Oral Q6H  . aspirin  81 mg Per Tube Daily  . atorvastatin  40 mg Per Tube Daily  . carvedilol  3.125 mg Oral BID WC  . chlorhexidine  15 mL Mouth Rinse BID  . Chlorhexidine Gluconate Cloth  6 each Topical Daily  . collagenase   Topical Daily  . docusate  50 mg Per Tube Daily  . enoxaparin (LOVENOX) injection  120 mg Subcutaneous Q12H  . feeding supplement (PROSource TF)  90 mL Per Tube QID  . free water  200 mL Per Tube Q4H  . insulin aspart  0-20 Units Subcutaneous Q4H  . insulin aspart  8 Units Subcutaneous Q4H  . insulin detemir  35 Units Subcutaneous BID  . mouth rinse  15  mL Mouth Rinse q12n4p  . metroNIDAZOLE  500 mg Oral Q12H  . nutrition supplement (JUVEN)  1 packet Per Tube BID BM  . oxyCODONE  10 mg Per Tube Q6H  . polyethylene glycol  17 g Per Tube Daily  . QUEtiapine  25 mg Oral QHS  . sodium chloride flush  10-40 mL Intracatheter Q12H  . sodium chloride flush  10-40 mL Intracatheter Q12H  . vitamin B-12  100 mcg Per Tube Daily   Continuous Infusions: . sodium chloride Stopped (12/31/20 1020)  . cefTRIAXone (ROCEPHIN)  IV 2 g (01/05/21 40980634)  . DAPTOmycin (CUBICIN)  IV 900 mg (01/05/21 1516)  . feeding supplement (OSMOLITE 1.2 CAL) 1,000 mL (01/05/21 0243)    Consultants:see note  Procedures:  CT chest/abd/pelvis: 1.Double-J ureteral stent is stable in position in the left kidney. 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 5.0 cm distal abdominal aortic aneurysm. Recommend follow-up CT/MR every 6 months and vascular consultation. This  recommendation follows ACR consensus guidelines: White Paper of the ACR Incidental Findings Committee II on Vascular Findings. J Am Coll Radiol 2013; 10:789-794. 5. Improved bibasilar airspace disease  Antimicrobials: Anti-infectives (From admission, onward)   Start     Dose/Rate Route Frequency Ordered Stop   01/06/21 1000  metroNIDAZOLE (FLAGYL) tablet 500 mg        500 mg Oral Every 12 hours 01/06/21 0903     01/05/21 1700  DAPTOmycin (CUBICIN) 900 mg in sodium chloride 0.9 % IVPB  Status:  Discontinued        9 mg/kg  97.6 kg (Adjusted) 136 mL/hr over 30 Minutes Intravenous Daily 01/05/21 0837 01/05/21 0845   01/05/21 1500  DAPTOmycin (CUBICIN) 900 mg in sodium chloride 0.9 % IVPB        9 mg/kg  97.6 kg (Adjusted) 136 mL/hr over 30 Minutes Intravenous Daily 01/05/21 0845     01/05/21 0800  cefTRIAXone (ROCEPHIN) 2 g in sodium chloride 0.9 % 100 mL IVPB        2 g 200 mL/hr over 30 Minutes Intravenous Every 24 hours 01/04/21 0436     01/04/21 0530  cefTRIAXone (ROCEPHIN) 2 g in sodium chloride 0.9 % 100 mL IVPB        2 g 200 mL/hr over 30 Minutes Intravenous  Once 01/04/21 0436 01/04/21 0709   01/04/21 0500  vancomycin (VANCOREADY) IVPB 1000 mg/200 mL  Status:  Discontinued        1,000 mg 200 mL/hr over 60 Minutes Intravenous Every 12 hours 01/03/21 2216 01/05/21 0837   01/01/21 0600  vancomycin (VANCOREADY) IVPB 500 mg/100 mL  Status:  Discontinued        500 mg 100 mL/hr over 60 Minutes Intravenous Every 12 hours 12/31/20 1036 01/03/21 2216   12/29/20 0600  vancomycin (VANCOREADY) IVPB 1000 mg/200 mL  Status:  Discontinued        1,000 mg 200 mL/hr over 60 Minutes Intravenous Every 24 hours 12/28/20 0713 12/31/20 1036   12/25/20 0600  vancomycin (VANCOREADY) IVPB 1500 mg/300 mL  Status:  Discontinued        1,500 mg 150 mL/hr over 120 Minutes Intravenous Every 24 hours 12/24/20 1840 12/28/20 0713   12/24/20 1915  vancomycin (VANCOCIN) 2,500 mg in sodium chloride 0.9 % 500  mL IVPB        2,500 mg 250 mL/hr over 120 Minutes Intravenous  Once 12/24/20 1820 12/24/20 2147  12/24/20 1000  cefTRIAXone (ROCEPHIN) 2 g in sodium chloride 0.9 % 100 mL IVPB  Status:  Discontinued        2 g 200 mL/hr over 30 Minutes Intravenous Every 24 hours 12/24/20 0856 12/25/20 1607   12/17/20 2200  cefTRIAXone (ROCEPHIN) 2 g in sodium chloride 0.9 % 100 mL IVPB        2 g 200 mL/hr over 30 Minutes Intravenous Every 24 hours 12/17/20 1123 12/21/20 2135   12/16/20 1100  ceFEPIme (MAXIPIME) 2 g in sodium chloride 0.9 % 100 mL IVPB  Status:  Discontinued        2 g 200 mL/hr over 30 Minutes Intravenous Every 12 hours 12/16/20 0946 12/17/20 1123   12/14/20 1400  ceFAZolin (ANCEF) IVPB 2g/100 mL premix  Status:  Discontinued        2 g 200 mL/hr over 30 Minutes Intravenous Every 8 hours 12/14/20 1223 12/16/20 0946   12/10/20 1530  cefTRIAXone (ROCEPHIN) 2 g in sodium chloride 0.9 % 100 mL IVPB  Status:  Discontinued        2 g 200 mL/hr over 30 Minutes Intravenous Every 24 hours 12/10/20 1436 12/14/20 1223   12/10/20 1200  piperacillin-tazobactam (ZOSYN) IVPB 2.25 g  Status:  Discontinued        2.25 g 100 mL/hr over 30 Minutes Intravenous Every 6 hours 12/10/20 0740 12/10/20 1436   12/10/20 0600  vancomycin (VANCOREADY) IVPB 1500 mg/300 mL        1,500 mg 150 mL/hr over 120 Minutes Intravenous STAT 12/10/20 0546 12/10/20 0821   12/10/20 0400  vancomycin (VANCOREADY) IVPB 1000 mg/200 mL        1,000 mg 200 mL/hr over 60 Minutes Intravenous  Once 12/10/20 0350 12/10/20 0520   12/10/20 0400  piperacillin-tazobactam (ZOSYN) IVPB 3.375 g        3.375 g 12.5 mL/hr over 240 Minutes Intravenous  Once 12/10/20 0350 12/10/20 0450     Culture/Microbiology    Component Value Date/Time   SDES URINE, RANDOM 01/05/2021 0738   SPECREQUEST NONE 01/05/2021 0738   CULT  01/05/2021 0738    NO GROWTH Performed at [facility], [address], [place], [place] [address]    REPTSTATUS  01/06/2021 FINAL 01/05/2021 1914    Other culture-see note  Objective: Vitals: Today's Vitals   01/05/21 2203 01/06/21 0011 01/06/21 0330 01/06/21 0747  BP:  (!) 142/64 (!) 144/69 117/63  Pulse:  (!) 115 (!) 108 (!) 108  Resp:  (!) Temp:    (!) 100.9 F (38.3 C)  TempSrc:    Axillary  SpO2:  92% 92% 94%  Weight:      Height:      PainSc: 6        Intake/Output Summary (Last 24 hours) at 01/06/2021 1125 Last data filed at 01/06/2021 0618 Gross per 24 hour  Intake 1990 ml  Output 3200 ml  Net -1210 ml   Filed Weights   12/28/20 0345 12/29/20 0400 01/03/21 0500  Weight: 127.8 kg 126.7 kg 120.6 kg   Weight change:   Intake/Output from previous day: 04/26 0701 - 04/27 0700 In: 2265 [NG/GT:2265] Out: 3200 [Urine:3200] Intake/Output this shift: No intake/output data recorded. Filed Weights   12/28/20 0345 12/29/20 0400 01/03/21 0500  Weight: 127.8 kg 126.7 kg 120.6 kg    Examination: General exam: AAOx0x1-2, follows some commands, , NAD, weak appearing. HEENT:Oral mucosa moist, Ear/Nose WNL grossly, dentition normal. Respiratory system: bilaterally  clear ,no wheezing or crackles,no use of accessory muscle Cardiovascular system: S1 & S2 +, No JVD,. Gastrointestinal system: Abdomen soft, NT,ND, BS+ Nervous System:Alert, awake, moving lower extremities and right upper extremities  Extremities: No edema, distal peripheral pulses palpable.  Skin: No rashes,no icterus. MSK: Normal muscle bulk,tone, power Foley in place sacral wound as below    Data Reviewed: I have personally reviewed following labs and imaging studies CBC: Recent Labs  Lab 12/31/20 0525 01/01/21 0230 01/02/21 0020 01/03/21 0243 01/04/21 0247 01/05/21 0800 01/06/21 0447  WBC 10.2  10.1   < > 9.3 10.8* 11.7* 11.7* 13.9*  NEUTROABS 7.6  --   --   --   --  9.6*  --   HGB 9.7*  9.5*   < > 8.9* 10.0* 9.5* 9.7* 9.6*  HCT 31.9*  32.1*   < > 28.6* 31.6* 29.9* 30.5* 31.2*  MCV 104.2*   104.2*   < > 101.1* 99.7 98.7 98.4 101.3*  PLT 216  208   < > 173 175 190 186 201   < > = values in this interval not displayed.   Basic Metabolic Panel: Recent Labs  Lab 12/31/20 0525 01/01/21 0230 01/02/21 0020 01/03/21 0243 01/04/21 0247 01/05/21 0622 01/06/21 0447  NA 148*   < > 145 143 144 146* 146*  K 3.9   < > 4.0 4.1 3.4* 3.7 3.8  CL 117*   < > 111 108 108 111 112*  CO2 26   < > 28 27 27 27 25   GLUCOSE 185*   < > 166* 195* 106* 136* 182*  BUN 49*   < > 55* 54* 57* 56* 52*  CREATININE 1.06   < > 1.20 1.05 0.99 1.03 1.00  CALCIUM 8.8*   < > 8.8* 8.9 8.7* 8.7* 8.8*  MG 2.2  --  2.2  --   --   --   --   PHOS 3.5  --   --   --   --   --   --    < > = values in this interval not displayed.   GFR: Estimated Creatinine Clearance: 111.2 mL/min (by C-G formula based on SCr of 1 mg/dL). Liver Function Tests: Recent Labs  Lab 12/31/20 1151 01/02/21 0020  AST 21 22  ALT 16 14  ALKPHOS 58 62  BILITOT 0.5 0.5  PROT 7.4 7.2  ALBUMIN 2.3* 2.2*   No results for input(s): LIPASE, AMYLASE in the last 168 hours. Recent Labs  Lab 01/04/21 1050  AMMONIA 34   Coagulation Profile: No results for input(s): INR, PROTIME in the last 168 hours. Cardiac Enzymes: Recent Labs  Lab 01/06/21 0447  CKTOTAL 129   BNP (last 3 results) No results for input(s): PROBNP in the last 8760 hours. HbA1C: No results for input(s): HGBA1C in the last 72 hours. CBG: Recent Labs  Lab 01/05/21 1540 01/05/21 1958 01/05/21 2325 01/06/21 0327 01/06/21 0749  GLUCAP 179* 136* 149* 168* 133*   Lipid Profile: No results for input(s): CHOL, HDL, LDLCALC, TRIG, CHOLHDL, LDLDIRECT in the last 72 hours. Thyroid Function Tests: No results for input(s): TSH, T4TOTAL, FREET4, T3FREE, THYROIDAB in the last 72 hours. Anemia Panel: No results for input(s): VITAMINB12, FOLATE, FERRITIN, TIBC, IRON, RETICCTPCT in the last 72 hours. Sepsis Labs: No results for input(s): PROCALCITON, LATICACIDVEN in the  last 168 hours.  Recent Results (from the past 240 hour(s))  Culture, Urine     Status: None   Collection Time: 01/02/21  4:06 AM   Specimen: Urine, Random  Result Value Ref Range Status   Specimen Description URINE, RANDOM  Final   Special Requests NONE  Final   Culture   Final    NO GROWTH Performed at [facility], [address], [place], [place] [address]    Report Status 01/03/2021 FINAL  Final  Culture, blood (routine x 2)     Status: Abnormal   Collection Time: 01/02/21  4:19 AM   Specimen: BLOOD LEFT HAND  Result Value Ref Range Status   Specimen Description BLOOD LEFT HAND  Final   Special Requests AEROBIC BOTTLE ONLY Blood Culture adequate volume  Final   Culture  Setup Time   Final    GRAM POSITIVE COCCI IN CLUSTERS AEROBIC BOTTLE ONLY CRITICAL VALUE NOTED.  VALUE IS CONSISTENT WITH PREVIOUSLY REPORTED AND CALLED VALUE.    Culture (A)  Final    STAPHYLOCOCCUS EPIDERMIDIS SUSCEPTIBILITIES PERFORMED ON PREVIOUS CULTURE WITHIN THE LAST 5 DAYS. Performed at [facility], [address], [place], [place] [address]    Report Status 01/05/2021 FINAL  Final  Culture, blood (routine x 2)     Status: Abnormal   Collection Time: 01/02/21  4:32 AM   Specimen: BLOOD LEFT HAND  Result Value Ref Range Status   Specimen Description BLOOD LEFT HAND  Final   Special Requests   Final    BOTTLES DRAWN AEROBIC AND ANAEROBIC Blood Culture results may not be optimal due to an inadequate volume of blood received in culture bottles   Culture  Setup Time   Final    GRAM POSITIVE COCCI ANAEROBIC BOTTLE ONLY CRITICAL RESULT CALLED TO, READ BACK BY AND VERIFIED WITH: PHARMD GREG ABBOTT 01/03/2021 AT 0630 A.HUGHES Performed at [facility], [address], [place], [place] [address]    Culture STAPHYLOCOCCUS EPIDERMIDIS (A)  Final   Report Status 01/05/2021 FINAL  Final   Organism ID, Bacteria STAPHYLOCOCCUS EPIDERMIDIS  Final      Susceptibility   Staphylococcus  epidermidis - MIC*    CIPROFLOXACIN >=8 RESISTANT Resistant     ERYTHROMYCIN >=8 RESISTANT Resistant     GENTAMICIN >=16 RESISTANT Resistant     OXACILLIN >=4 RESISTANT Resistant     TETRACYCLINE 2 SENSITIVE Sensitive     VANCOMYCIN 1 SENSITIVE Sensitive     TRIMETH/SULFA 80 RESISTANT Resistant     CLINDAMYCIN >=8 RESISTANT Resistant     RIFAMPIN <=0.5 SENSITIVE Sensitive     Inducible Clindamycin NEGATIVE Sensitive     * STAPHYLOCOCCUS EPIDERMIDIS  Blood Culture ID Panel (Reflexed)     Status: Abnormal   Collection Time: 01/02/21  4:32 AM  Result Value Ref Range Status   Enterococcus faecalis NOT DETECTED NOT DETECTED Final   Enterococcus Faecium NOT DETECTED NOT DETECTED Final   Listeria monocytogenes NOT DETECTED NOT DETECTED Final   Staphylococcus species DETECTED (A) NOT DETECTED Final    Comment: CRITICAL RESULT CALLED TO, READ BACK BY AND VERIFIED WITH: PHARMD GREG ABBOTT 01/03/2021 AT 0630 A.HUGHES    Staphylococcus aureus (BCID) NOT DETECTED NOT DETECTED Final   Staphylococcus epidermidis DETECTED (A) NOT DETECTED Final    Comment: Methicillin (oxacillin) resistant coagulase negative staphylococcus. Possible blood culture contaminant (unless isolated from more than one blood culture draw or clinical case suggests pathogenicity). No antibiotic treatment is indicated for blood  culture contaminants. CRITICAL RESULT CALLED TO, READ BACK BY AND VERIFIED WITH: PHARMD GREG ABBOTT 01/03/2021 AT 0630 A.HUGHES  Staphylococcus lugdunensis NOT DETECTED NOT DETECTED Final   Streptococcus species NOT DETECTED NOT DETECTED Final   Streptococcus agalactiae NOT DETECTED NOT DETECTED Final   Streptococcus pneumoniae NOT DETECTED NOT DETECTED Final   Streptococcus pyogenes NOT DETECTED NOT DETECTED Final   A.calcoaceticus-baumannii NOT DETECTED NOT DETECTED Final   Bacteroides fragilis NOT DETECTED NOT DETECTED Final   Enterobacterales NOT DETECTED NOT DETECTED Final   Enterobacter  cloacae complex NOT DETECTED NOT DETECTED Final   Escherichia coli NOT DETECTED NOT DETECTED Final   Klebsiella aerogenes NOT DETECTED NOT DETECTED Final   Klebsiella oxytoca NOT DETECTED NOT DETECTED Final   Klebsiella pneumoniae NOT DETECTED NOT DETECTED Final   Proteus species NOT DETECTED NOT DETECTED Final   Salmonella species NOT DETECTED NOT DETECTED Final   Serratia marcescens NOT DETECTED NOT DETECTED Final   Haemophilus influenzae NOT DETECTED NOT DETECTED Final   Neisseria meningitidis NOT DETECTED NOT DETECTED Final   Pseudomonas aeruginosa NOT DETECTED NOT DETECTED Final   Stenotrophomonas maltophilia NOT DETECTED NOT DETECTED Final   Candida albicans NOT DETECTED NOT DETECTED Final   Candida auris NOT DETECTED NOT DETECTED Final   Candida glabrata NOT DETECTED NOT DETECTED Final   Candida krusei NOT DETECTED NOT DETECTED Final   Candida parapsilosis NOT DETECTED NOT DETECTED Final   Candida tropicalis NOT DETECTED NOT DETECTED Final   Cryptococcus neoformans/gattii NOT DETECTED NOT DETECTED Final   Methicillin resistance mecA/C DETECTED (A) NOT DETECTED Final    Comment: CRITICAL RESULT CALLED TO, READ BACK BY AND VERIFIED WITH: PHARMD GREG ABBOTT 01/03/2021 AT 0630 A.HUGHES Performed at [facility], [address] [place], [place] [address]   Culture, blood (Routine X 2) w Reflex to ID Panel     Status: Abnormal   Collection Time: 01/03/21 11:31 AM   Specimen: BLOOD LEFT HAND  Result Value Ref Range Status   Specimen Description BLOOD LEFT HAND  Final   Special Requests   Final    BOTTLES DRAWN AEROBIC ONLY Blood Culture adequate volume   Culture  Setup Time   Final    GRAM NEGATIVE RODS AEROBIC BOTTLE ONLY PHARMD GREG ABBOTT 01/04/2021 AT 0425 A.HUGHES CRITICAL VALUE NOTED.  VALUE IS CONSISTENT WITH PREVIOUSLY REPORTED AND CALLED VALUE.    Culture (A)  Final    ESCHERICHIA COLI SUSCEPTIBILITIES PERFORMED ON PREVIOUS CULTURE WITHIN THE LAST 5  DAYS. Performed at [facility], [address], [place], [place] [address]    Report Status 01/06/2021 FINAL  Final  Culture, blood (Routine X 2) w Reflex to ID Panel     Status: Abnormal   Collection Time: 01/03/21 11:31 AM   Specimen: BLOOD LEFT FOREARM  Result Value Ref Range Status   Specimen Description BLOOD LEFT FOREARM  Final   Special Requests   Final    BOTTLES DRAWN AEROBIC ONLY Blood Culture adequate volume   Culture  Setup Time   Final    GRAM NEGATIVE RODS AEROBIC BOTTLE ONLY CRITICAL RESULT CALLED TO, READ BACK BY AND VERIFIED WITH: PHARMD GREG ABBOTT 01/04/2021 AT 0425 A.HUGHES Performed at [facility], [address] [place], [place] [address]    Culture ESCHERICHIA COLI (A)  Final   Report Status 01/06/2021 FINAL  Final   Organism ID, Bacteria ESCHERICHIA COLI  Final      Susceptibility   Escherichia coli - MIC*    AMPICILLIN 8 SENSITIVE Sensitive     CEFAZOLIN <=4 SENSITIVE Sensitive  CEFEPIME <=0.12 SENSITIVE Sensitive     CEFTAZIDIME <=1 SENSITIVE Sensitive     CEFTRIAXONE <=0.25 SENSITIVE Sensitive     CIPROFLOXACIN <=0.25 SENSITIVE Sensitive     GENTAMICIN <=1 SENSITIVE Sensitive     IMIPENEM <=0.25 SENSITIVE Sensitive     TRIMETH/SULFA <=20 SENSITIVE Sensitive     AMPICILLIN/SULBACTAM <=2 SENSITIVE Sensitive     PIP/TAZO <=4 SENSITIVE Sensitive     * ESCHERICHIA COLI  Blood Culture ID Panel (Reflexed)     Status: Abnormal   Collection Time: 01/03/21 11:31 AM  Result Value Ref Range Status   Enterococcus faecalis NOT DETECTED NOT DETECTED Final   Enterococcus Faecium NOT DETECTED NOT DETECTED Final   Listeria monocytogenes NOT DETECTED NOT DETECTED Final   Staphylococcus species NOT DETECTED NOT DETECTED Final   Staphylococcus aureus (BCID) NOT DETECTED NOT DETECTED Final   Staphylococcus epidermidis NOT DETECTED NOT DETECTED Final   Staphylococcus lugdunensis NOT DETECTED NOT DETECTED Final   Streptococcus species NOT DETECTED NOT  DETECTED Final   Streptococcus agalactiae NOT DETECTED NOT DETECTED Final   Streptococcus pneumoniae NOT DETECTED NOT DETECTED Final   Streptococcus pyogenes NOT DETECTED NOT DETECTED Final   A.calcoaceticus-baumannii NOT DETECTED NOT DETECTED Final   Bacteroides fragilis NOT DETECTED NOT DETECTED Final   Enterobacterales DETECTED (A) NOT DETECTED Final    Comment: Enterobacterales represent a large order of gram negative bacteria, not a single organism. CRITICAL RESULT CALLED TO, READ BACK BY AND VERIFIED WITH: PHARMD GREG ABBOTT 01/04/2021 AT 0425 A.HUGHES    Enterobacter cloacae complex NOT DETECTED NOT DETECTED Final   Escherichia coli DETECTED (A) NOT DETECTED Final    Comment: CRITICAL RESULT CALLED TO, READ BACK BY AND VERIFIED WITH: PHARMD GREG ABBOTT 01/04/2021 AT 0425 A.HUGHES    Klebsiella aerogenes NOT DETECTED NOT DETECTED Final   Klebsiella oxytoca NOT DETECTED NOT DETECTED Final   Klebsiella pneumoniae NOT DETECTED NOT DETECTED Final   Proteus species NOT DETECTED NOT DETECTED Final   Salmonella species NOT DETECTED NOT DETECTED Final   Serratia marcescens NOT DETECTED NOT DETECTED Final   Haemophilus influenzae NOT DETECTED NOT DETECTED Final   Neisseria meningitidis NOT DETECTED NOT DETECTED Final   Pseudomonas aeruginosa NOT DETECTED NOT DETECTED Final   Stenotrophomonas maltophilia NOT DETECTED NOT DETECTED Final   Candida albicans NOT DETECTED NOT DETECTED Final   Candida auris NOT DETECTED NOT DETECTED Final   Candida glabrata NOT DETECTED NOT DETECTED Final   Candida krusei NOT DETECTED NOT DETECTED Final   Candida parapsilosis NOT DETECTED NOT DETECTED Final   Candida tropicalis NOT DETECTED NOT DETECTED Final   Cryptococcus neoformans/gattii NOT DETECTED NOT DETECTED Final   CTX-M ESBL NOT DETECTED NOT DETECTED Final   Carbapenem resistance IMP NOT DETECTED NOT DETECTED Final   Carbapenem resistance KPC NOT DETECTED NOT DETECTED Final   Carbapenem  resistance NDM NOT DETECTED NOT DETECTED Final   Carbapenem resist OXA 48 LIKE NOT DETECTED NOT DETECTED Final   Carbapenem resistance VIM NOT DETECTED NOT DETECTED Final    Comment: Performed at [facility], [address], [place], [place] [address]  Culture, blood (routine x 2)     Status: None (Preliminary result)   Collection Time: 01/05/21  6:10 AM   Specimen: BLOOD RIGHT HAND  Result Value Ref Range Status   Specimen Description BLOOD RIGHT HAND  Final   Special Requests   Final    BOTTLES DRAWN AEROBIC ONLY Blood Culture adequate volume   Culture  Final    NO GROWTH 1 DAY Performed at [facility], [address] [place], [place] [address]    Report Status PENDING  Incomplete  Culture, blood (routine x 2)     Status: None (Preliminary result)   Collection Time: 01/05/21  6:11 AM   Specimen: BLOOD RIGHT HAND  Result Value Ref Range Status   Specimen Description BLOOD RIGHT HAND  Final   Special Requests   Final    BOTTLES DRAWN AEROBIC ONLY Blood Culture adequate volume   Culture   Final    NO GROWTH 1 DAY Performed at [facility], [address], [place], [place] [address]    Report Status PENDING  Incomplete  Culture, Urine     Status: None   Collection Time: 01/05/21  7:38 AM   Specimen: Urine, Random  Result Value Ref Range Status   Specimen Description URINE, RANDOM  Final   Special Requests NONE  Final   Culture   Final    NO GROWTH Performed at [facility], [address], [place], [place] [address]    Report Status 01/06/2021 FINAL  Final     Radiology Studies: CT ABDOMEN PELVIS WO CONTRAST  Result Date: 01/05/2021 CLINICAL DATA:  Ureteral stent displacement. EXAM: CT ABDOMEN AND PELVIS WITHOUT CONTRAST TECHNIQUE: Multidetector CT imaging of the abdomen and pelvis was performed following the standard protocol without IV contrast. COMPARISON:  CT of the abdomen and pelvis 12/23/2020 FINDINGS: Lower chest: Bibasilar airspace  disease is improved. No significant effusion or pneumothorax is present. Heart size is normal. NG tube is in place. Hepatobiliary: No focal liver abnormality is seen. No gallstones, gallbladder wall thickening, or biliary dilatation. Pancreas: Unremarkable. No pancreatic ductal dilatation or surrounding inflammatory changes. Spleen: Normal in size without focal abnormality. Adrenals/Urinary Tract: Adrenal glands are normal bilaterally. Right kidney is within normal limits. Ureters unremarkable. Double-J ureteral stent is stable in position in the left kidney. 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 Foley catheter is present in the bladder. Bladder is otherwise decompressed. Stomach/Bowel: Stomach is unremarkable. Duodenum is normal. Small bowel is within normal limits. Terminal ileum is within normal limits. The appendix is not discretely visualized and may be surgically absent. Contrast is present throughout the colon. Colon is unremarkable. Vascular/Lymphatic: Atherosclerotic calcifications are present in the aorta and branch vessels. Distal aortic aneurysm measuring up to 5.0 cm again noted. Proximal left iliac artery aneurysm measuring 4.2 cm noted. No aneurysm is present a right iliac artery. No additional aortic aneurysm is present. Reproductive: Prostate is unremarkable. Other: No abdominal wall hernia or abnormality. No abdominopelvic ascites. Musculoskeletal: Vertebral body heights maintained. No significant listhesis is present. Levoconvex scoliosis stable. No focal lytic or blastic lesions are present. Hips are located and within normal limits. IMPRESSION: 1. Double-J ureteral stent is stable in position in the left kidney. 2. 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 3. 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 4. 5.0 cm distal abdominal aortic aneurysm. Recommend follow-up CT/MR every 6 months and vascular consultation.  This recommendation follows ACR consensus guidelines: White Paper of the ACR Incidental Findings Committee II on Vascular Findings. J Am Coll Radiol 2013; 10:789-794. 5. Improved bibasilar airspace disease. 6. Aortic Atherosclerosis (ICD10-I70.0). Electronically Signed   By: Marin Roberts M.D.   On: 01/05/2021 13:04   CT HEAD WO CONTRAST  Result Date: 01/05/2021 CLINICAL DATA:  Delirium.  Altered mental status. EXAM: CT HEAD WITHOUT CONTRAST TECHNIQUE: Contiguous axial images were obtained from the base of the skull through the vertex without intravenous contrast. COMPARISON:  MRI/MRA head 12/22/2020. FINDINGS: Brain: Mildly motion degraded exam. 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 There is no acute intracranial hemorrhage. No demarcated cortical infarct. No extra-axial fluid collection. No evidence of intracranial mass. No midline shift. Vascular: No hyperdense vessel.  Atherosclerotic calcifications Skull: Normal. Negative for fracture or focal lesion. Sinuses/Orbits: Visualized orbits show no acute finding. Postsurgical appearance of the paranasal sinuses. Mucosal thickening and frothy secretions within the left frontal sinus. Mild mucosal thickening within the bilateral ethmoidectomy cavities. Near complete opacification of the sphenoid sinuses. Mild-to-moderate mucosal thickening, small volume frothy secretions and small mucous retention cysts within the right maxillary sinus. Mild mucosal thickening and small mucous retention cysts within the left maxillary sinus. Other: Bilateral mastoid effusions.  Nasoenteric tube. IMPRESSION: Mildly motion degraded examination. No evidence of acute intracranial abnormality. 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 Paranasal sinus disease, as described. Bilateral mastoid effusions. Electronically  Signed   By: Jackey Loge DO   On: 01/05/2021 10:17   VAS [place] LOWER EXTREMITY VENOUS (DVT)  Result Date: 01/05/2021  Lower Venous DVT Study Patient Name:  [name]  Date of Exam:   01/04/2021 Medical Rec #: [id number]     Accession #:    [id number] Date of Birth: 1961-07-27    Patient Gender: M Patient Age:   32Y Exam Location:  [facility] Procedure:      VAS [place] LOWER EXTREMITY VENOUS (DVT) Referring Phys: Kathlynn Grate --------------------------------------------------------------------------------  Indications: Recurrent fevers.  Limitations: Body habitus, poor ultrasound/tissue interface and poor patient cooperation/movement. Comparison Study: 12/20/2020 and 12/15/2020- negative lower extremtiy venous                   duplexes Performing Technologist: Gertie Fey MHA, RDMS, RVT, RDCS  Examination Guidelines: A complete evaluation includes B-mode imaging, spectral Doppler, color Doppler, and power Doppler as needed of all accessible portions of each vessel. Bilateral testing is considered an integral part of a complete examination. Limited examinations for reoccurring indications may be performed as noted. The reflux portion of the exam is performed with the patient in reverse Trendelenburg.  +---------+---------------+---------+-----------+----------+--------------+ RIGHT    CompressibilityPhasicitySpontaneityPropertiesThrombus Aging +---------+---------------+---------+-----------+----------+--------------+ CFV                     Yes      Yes                                 +---------+---------------+---------+-----------+----------+--------------+ SFJ                              Yes                                 +---------+---------------+---------+-----------+----------+--------------+ FV Prox  Full                                                        +---------+---------------+---------+-----------+----------+--------------+  FV Mid   Full                                                         +---------+---------------+---------+-----------+----------+--------------+ FV DistalFull                                                        +---------+---------------+---------+-----------+----------+--------------+ POP      Full           Yes      Yes                                 +---------+---------------+---------+-----------+----------+--------------+ PTV      Full                                                        +---------+---------------+---------+-----------+----------+--------------+ PERO     Full                                                        +---------+---------------+---------+-----------+----------+--------------+ VV       None                                         Acute          +---------+---------------+---------+-----------+----------+--------------+ Unable to perform compression maneuver at groin level due to poor patient cooperation.  Right Technical Findings: Not visualized segments include PFV.  +---------+---------------+---------+-----------+----------+--------------+ LEFT     CompressibilityPhasicitySpontaneityPropertiesThrombus Aging +---------+---------------+---------+-----------+----------+--------------+ CFV      Full           Yes      Yes                                 +---------+---------------+---------+-----------+----------+--------------+ SFJ      Full                                                        +---------+---------------+---------+-----------+----------+--------------+ FV Prox  Full                                                        +---------+---------------+---------+-----------+----------+--------------+ FV Mid   Full                                                        +---------+---------------+---------+-----------+----------+--------------+  FV DistalFull                                                         +---------+---------------+---------+-----------+----------+--------------+ POP      Full           Yes      Yes                                 +---------+---------------+---------+-----------+----------+--------------+ PTV      Full                                                        +---------+---------------+---------+-----------+----------+--------------+ PERO     Full                                                        +---------+---------------+---------+-----------+----------+--------------+   Left Technical Findings: Not visualized segments include PFV.   Summary: RIGHT: - There is no evidence of deep vein thrombosis in the lower extremity. However, portions of this examination were limited- see technologist comments above. Evidence of acute superficial vein thrombosis involving a varicose vein of the mid medial right calf.  - No cystic structure found in the popliteal fossa.  LEFT: - There is no evidence of deep vein thrombosis in the lower extremity. However, portions of this examination were limited- see technologist comments above.  - No cystic structure found in the popliteal fossa.  *See table(s) above for measurements and observations. Electronically signed by Waverly Ferrari MD on 01/05/2021 at 7:30:16 AM.    Final    VAS [place] UPPER EXTREMITY VENOUS DUPLEX  Result Date: 01/05/2021 UPPER VENOUS STUDY  Patient Name:  [name]  Date of Exam:   01/04/2021 Medical Rec #: [id number]     Accession #:    [id number] Date of Birth: 16-Jan-1961    Patient Gender: M Patient Age:   59Y Exam Location:  [facility] Procedure:      VAS [place] UPPER EXTREMITY VENOUS DUPLEX Referring Phys: Kathlynn Grate --------------------------------------------------------------------------------  Indications: Recurrent fevers Limitations: Body habitus, poor ultrasound/tissue interface and poor patient cooperation/movement. Comparison Study: 12/25/2020- left IJV and subclavian vein DVT  Performing Technologist: Gertie Fey MHA, RDMS, RVT, RDCS  Examination Guidelines: A complete evaluation includes B-mode imaging, spectral Doppler, color Doppler, and power Doppler as needed of all accessible portions of each vessel. Bilateral testing is considered an integral part of a complete examination. Limited examinations for reoccurring indications may be performed as noted.  Right Findings: +----------+------------+---------+-----------+----------+--------------+ RIGHT     CompressiblePhasicitySpontaneousProperties   Summary     +----------+------------+---------+-----------+----------+--------------+ IJV                                                 Not visualized +----------+------------+---------+-----------+----------+--------------+ Subclavian    Full  Yes       Yes                             +----------+------------+---------+-----------+----------+--------------+ Axillary      Full       Yes       Yes                             +----------+------------+---------+-----------+----------+--------------+ Brachial      Full       Yes       Yes                             +----------+------------+---------+-----------+----------+--------------+ Radial        Full                                                 +----------+------------+---------+-----------+----------+--------------+ Ulnar         Full                                                 +----------+------------+---------+-----------+----------+--------------+ Cephalic      Full                                                 +----------+------------+---------+-----------+----------+--------------+ Basilic                                             Not visualized +----------+------------+---------+-----------+----------+--------------+  Left Findings: +----------+------------+---------+-----------+----------+--------------+ LEFT       CompressiblePhasicitySpontaneousProperties   Summary     +----------+------------+---------+-----------+----------+--------------+ IJV           Full                                                 +----------+------------+---------+-----------+----------+--------------+ Subclavian    Full       Yes       Yes                             +----------+------------+---------+-----------+----------+--------------+ Axillary                 Yes       Yes                             +----------+------------+---------+-----------+----------+--------------+ Brachial      Full       Yes       Yes                             +----------+------------+---------+-----------+----------+--------------+ Radial        Full                                                 +----------+------------+---------+-----------+----------+--------------+  Ulnar                                               Not visualized +----------+------------+---------+-----------+----------+--------------+ Cephalic                                            Not visualized +----------+------------+---------+-----------+----------+--------------+ Basilic                                             Not visualized +----------+------------+---------+-----------+----------+--------------+ Unable to perform compression maneuvers on some segments due to technical limitations.  Summary:  Bilateral: Study was technically limited as described above. No obvious evidence of DVT or superficial vein thrombosis involving visualized veins of bilateral upper extremities. When compared to prior study, previous DVT in left IJV and subclavian veins appear to have resolved.  *See table(s) above for measurements and observations.  Diagnosing physician: Waverly Ferrari MD Electronically signed by Waverly Ferrari MD on 01/05/2021 at 7:31:09 AM.    Final    ECHOCARDIOGRAM LIMITED  Result Date: 01/04/2021     ECHOCARDIOGRAM LIMITED REPORT   Patient Name:   [name] Date of Exam: 01/04/2021 Medical Rec #:  [id number]    Height:       74.0 in Accession #:    [id number]   Weight:       265.9 lb Date of Birth:  Jul 30, 1961   BSA:          2.453 m Patient Age:    59 years     BP:           143/78 mmHg Patient Gender: M            HR:           112 bpm. Exam Location:  Inpatient Procedure: 2D Echo, Cardiac Doppler and Color Doppler Indications:    Bacteremia  History:        Patient has prior history of Echocardiogram examinations, most                 recent 12/25/2020. Risk Factors:Hypertension and Former Smoker.  Sonographer:    Shirlean Kelly Referring Phys: [id number] Kathlynn Grate IMPRESSIONS  1. Mild intracavitary gradient. Peak velocity 1.93 m/s. Peak gradient 15 mmHg. Left ventricular ejection fraction, by estimation, is 60 to 65%. The left ventricle has normal function. The left ventricle has no regional wall motion abnormalities.  2. Right ventricular systolic function is normal. The right ventricular size is normal. There is normal pulmonary artery systolic pressure.  3. The mitral valve is normal in structure. No evidence of mitral valve regurgitation. No evidence of mitral stenosis.  4. The aortic valve is normal in structure. There is mild calcification of the aortic valve. No aortic stenosis is present.  5. The inferior vena cava is normal in size with greater than 50% respiratory variability, suggesting right atrial pressure of 3 mmHg. FINDINGS  Left Ventricle: Mild intracavitary gradient. Peak velocity 1.93 m/s. Peak gradient 15 mmHg. Left ventricular ejection fraction, by estimation, is 60 to 65%. The left ventricle has normal function. The left ventricle has no regional wall motion abnormalities. The left  ventricular internal cavity size was normal in size. There is no left ventricular hypertrophy. Right Ventricle: The right ventricular size is normal. No increase in right ventricular wall thickness.  Right ventricular systolic function is normal. There is normal pulmonary artery systolic pressure. The tricuspid regurgitant velocity is 1.64 m/s, and  with an assumed right atrial pressure of 3 mmHg, the estimated right ventricular systolic pressure is 13.8 mmHg. Left Atrium: Left atrial size was normal in size. Right Atrium: Right atrial size was normal in size. Pericardium: There is no evidence of pericardial effusion. Mitral Valve: The mitral valve is normal in structure. No evidence of mitral valve stenosis. Tricuspid Valve: The tricuspid valve is normal in structure. Tricuspid valve regurgitation is trivial. No evidence of tricuspid stenosis. Aortic Valve: The aortic valve is normal in structure. There is mild calcification of the aortic valve. No aortic stenosis is present. Aortic valve mean gradient measures 8.0 mmHg. Aortic valve peak gradient measures 14.7 mmHg. Pulmonic Valve: The pulmonic valve was normal in structure. Pulmonic valve regurgitation is not visualized. No evidence of pulmonic stenosis. Aorta: The aortic root is normal in size and structure. Venous: The inferior vena cava is normal in size with greater than 50% respiratory variability, suggesting right atrial pressure of 3 mmHg. IAS/Shunts: No atrial level shunt detected by color flow Doppler. AORTIC VALVE AV Vmax:           191.50 cm/s AV Vmean:          133.500 cm/s AV VTI:            0.238 m AV Peak Grad:      14.7 mmHg AV Mean Grad:      8.0 mmHg LVOT Vmax:         149.00 cm/s LVOT Vmean:        94.600 cm/s LVOT VTI:          0.198 m LVOT/AV VTI ratio: 0.83 TRICUSPID VALVE TR Peak grad:   10.8 mmHg TR Vmax:        164.00 cm/s  SHUNTS Systemic VTI: 0.20 m Chilton Si MD Electronically signed by Chilton Si MD Signature Date/Time: 01/04/2021/3:07:29 PM    Final      LOS: 27 days   Lanae Boast, MD [facility]  01/06/2021, 11:25 AM

## 2021-01-07 ENCOUNTER — Inpatient Hospital Stay (HOSPITAL_COMMUNITY): Payer: Medicaid Other

## 2021-01-07 LAB — BASIC METABOLIC PANEL
Anion gap: 8 (ref 5–15)
BUN: 54 mg/dL — ABNORMAL HIGH (ref 6–20)
CO2: 30 mmol/L (ref 22–32)
Calcium: 8.8 mg/dL — ABNORMAL LOW (ref 8.9–10.3)
Chloride: 108 mmol/L (ref 98–111)
Creatinine, Ser: 0.94 mg/dL (ref 0.61–1.24)
GFR, Estimated: >60 mL/min (ref >60)
Glucose, Bld: 131 mg/dL — ABNORMAL HIGH (ref 70–99)
Potassium: 3.5 mmol/L (ref 3.5–5.1)
Sodium: 146 mmol/L — ABNORMAL HIGH (ref 135–145)

## 2021-01-07 LAB — CBC
HCT: 29 % — ABNORMAL LOW (ref 39.0–52.0)
Hemoglobin: 9 g/dL — ABNORMAL LOW (ref 13.0–17.0)
MCH: 31.3 pg (ref 26.0–34.0)
MCHC: 31 g/dL (ref 30.0–36.0)
MCV: 100.7 fL — ABNORMAL HIGH (ref 80.0–100.0)
Platelets: 188 10*3/uL (ref 150–400)
RBC: 2.88 MIL/uL — ABNORMAL LOW (ref 4.22–5.81)
RDW: 14.2 % (ref 11.5–15.5)
WBC: 9.7 10*3/uL (ref 4.0–10.5)
nRBC: 0 % (ref 0.0–0.2)

## 2021-01-07 LAB — GLUCOSE, CAPILLARY
Glucose-Capillary: 123 mg/dL — ABNORMAL HIGH (ref 70–99)
Glucose-Capillary: 133 mg/dL — ABNORMAL HIGH (ref 70–99)
Glucose-Capillary: 148 mg/dL — ABNORMAL HIGH (ref 70–99)
Glucose-Capillary: 163 mg/dL — ABNORMAL HIGH (ref 70–99)
Glucose-Capillary: 192 mg/dL — ABNORMAL HIGH (ref 70–99)
Glucose-Capillary: 83 mg/dL (ref 70–99)

## 2021-01-07 MED ORDER — FREE WATER
250.0000 mL | Status: DC
  Administered 2021-01-07 – 2021-01-13 (×37): 250 mL

## 2021-01-07 MED ORDER — IPRATROPIUM-ALBUTEROL 0.5-2.5 (3) MG/3ML IN SOLN
3.0000 mL | Freq: Two times a day (BID) | RESPIRATORY_TRACT | Status: DC
  Administered 2021-01-07 – 2021-01-08 (×4): 3 mL via RESPIRATORY_TRACT
  Filled 2021-01-07 (×4): qty 3

## 2021-01-07 MED ORDER — CEFAZOLIN SODIUM-DEXTROSE 2-4 GM/100ML-% IV SOLN
2.0000 g | Freq: Three times a day (TID) | INTRAVENOUS | Status: AC
  Administered 2021-01-07 – 2021-01-14 (×19): 2 g via INTRAVENOUS
  Filled 2021-01-07 (×25): qty 100

## 2021-01-07 NOTE — Progress Notes (Signed)
PROGRESS NOTE    [name]  ZOX:[id number] DOB: 02-28-1961 DOA: 12/10/2020 PCP: Pcp, No   Chief Complaint  Patient presents with  . Altered Mental Status  Brief Narrative: 60 YO male w/ hypertension, hyperlipidemia presented to the ED 3/31 from home with for 5 days history of diarrhea nausea vomiting and confusion.  Patient was admitted for acute hypoxic aspiratory failure with septic shock due to E. coli bacteremia and UTI and ATN needing intubation pressor support sedation with Precedex/Versed. Patient had prolonged hospitalization complicated course (see previous ICU/transfer note) was managed by critical care, infectious disease for E. coli pyelonephritis complicated by hydronephrosis requiring placement of a stent and bacteremia then MSSA pneumonia,   MRSE endovascular infection, acute hypoxic/hypercapnic respiratory failure/acute toxic metabolic encephalopathy, AKI, left IJ/subclavian DVT versus septic thrombus-managed with Lovenox and ongoing fever/tachycardia.. [facility]-with ongoing confusion/altered mental status and mentation slowly improving, ongoing tachycardia, fevers episodes Cortrack NG feeding, PICC line.  Intermittently febrile, tachycardic tachypneic due to ongoing bacteremia. Vanco trough and peak level subtherapeutic 4/24.due to persistent bacteremia PICC line has been removed -4/24, repeat blood culture sent 4/24- and ceftriaxone added. Duplex UE/LE ordered again 4/25-no  thrombosis in bilateral upper extremities and DVT in the left IJ and subclavian vein appears to have resolved compared to previous study, no DVT in lower extremities, evidence of acute superficial vein thrombosis from varicose vein of the medial right calf. Underwent repeat CT abdomen pelvis and CT head no acute finding.  Antibiotics changed to daptomycin due to subtherapeutic Vanco level 4/26  Subjective:  More alert, awake, able to talk to me  For the first time-told me his  name current president that he is in [place], but voice is not very clear still- Rt wrist on soft restraint, mouth appears dry Moving LE. Wbc improved, temp 101.4 at noon yesterday and afebrile since, blood cx negative so far from 4/25. surgery consulted this am Nursing reports wheezing mild tachypnea after being off oxygen - placed back on 2 L nasal cannula chest x-ray and nebulizer ordered  Assessment & Plan:  MRSE endovascular infection: TTE no acute finding repeat CT abdomen pelvis 4/26 unremarkable.  Last blood culture on 4/22 still MRSA positive, repeat blood culture 4/25 in process.Vanco trough and peak level subtherapeutic 4/24. picc removed -4/24. Duplex UE/LE 4/25-no  thrombosis in bilateral upper extremities and DVT in the left IJ and subclavian vein appears to have resolved compared to previous study, no DVT in lower extremities, evidence of acute superficial vein thrombosis from varicose vein of the medial right calf.  Big sacral wound likely contributing to some of the fever/infection.Vancomycin switched to daptomycin 4/26  Flagyl added 4/27, on Ceftriaxone too.  Appreciate ID input- ??TEE need-defer to ID-TTE 4/25 however was w/ nl lvef 60-65%, RV systolic function normal normal pulmonary artery pressure mitral valve normal aortic valve normal, no regional WMA. I did consult Surgery to take a look at wound today.Leukocytosis appears to be improving, as having less recurrence of fever, continue to monitor. Palliative is following and currently on full scope of treatment.  Recurrent E. coli bacteremia/E. coli pyelonephritis, repeat blood culture positive with E. coli bacteremia 4/24, repeat blood culture 4/25 pending.  Previously on ceftriaxone 3/31-4/3, 4/7-4/11, 4/14-4/15. Ceftriaxone was added 4/24_ 4/27- [facility] Cont per ID. Left renal double-J stent is stable in position on CT abdomen 4/26.  Deep tissue injury/sacral wound- continue antibiotic as above, flagyl added  4/27.  Wound care on board, surgery  consulted 4/28  MSSA pneumonia. abx as above.  Tachycardia/Tachypnea- w/ fever. HR appears stable on Coreg.  Continue tube feeding 75 cc/h plus free water  Up 200> 250 mL every 4 hours as he appears dry, and sodium is up.  Septic shock multifactorial 2/2 E. coli pyelonephritis, MSSA pneumonia, MRSE bacteremia with left IJ/[place] septic thrombophlebitis.  Off pressors and transferred out of ICU.  Fever Subsiding antibiotics being managed as above.  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  Acute hypoxic/hypercapnic respiratory failure with MSSA pneumonia: Initially intubated, extubated 4/16.  PCCM recommends BiPAP at bedtime has been needing high flow nasal cannula at 8 L-he was able to come off oxygen 4/24.  PCCM reviewed the chart and signed off 4/25.  Added nebulizer, low-dose oxygen for comfort repeated chest x-ray 4/28 am-no substantial change in patchy airspace opacities in the right midlung and history opacities at the bases could represent atelectasis aspiration and/or pneumonia  Mild hypernatremia increasing free water from 200> 250 ml q4h. monitor BMP  AKI in the setting of sepsis/ATN/pyelonephritis with hydronephrosis status post left ureteral stent 4/6, repeat ultrasound showed mild left hydronephrosis.  CT abdomen stable position of double-J stent.  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  Able to verbalize more today, more alert awake and oriented.  Continue PT OT speech.  Right wrist to remain on soft restraint , and has weakness in  left upper arm.  Able to move bilateral lower legs.  For stroke continue aspirin 81, Lipitor.  Lipid panel 4/12 showed HDL 30 and LDL 83.  Added on low-dose Seroquel bedtime seems to be responding well.  Repeat CD head 4/26-no new acute finding.  Continue delirium precaution, fall precaution, supportive care  Essential hypertension BP well controlled on low-dose Coreg switched from amlodipine given   Diabetes mellitus type II on glipizide prior to admission him will HBA1c 6.8.blood sugar is controlled on Lantus 35 u bid, novolog 4 units and q4ssi while on tube feeding.  Continue to monitor CBG. Recent Labs  Lab 01/06/21 1230 01/06/21 1823 01/06/21 2008 01/06/21 2315 01/07/21 0327  GLUCAP 194* 133* 126* 159* 123*   Left IJ/subclavian DVT versus septic thrombus: Appears resolved on repeat duplex 4/25. Pt remains on SQ Lovenox therapeutic dose by PCCM team. Ij LINES OFF in ICU, PICC line has been removed 4/24 RUE.  Chronic microcytic anemia hemoglobin is stable transfuse if less than 7 g  QTC prolonged:Avoid QT prolonging medication maintain electrolytes mag above 2 potassium above 4 and monitor.  Abdominal aortic aneurysm 4.8 cm advised 6 months followed by radiology  Morbid obesity with BMI 35.8.  Would benefit weight loss.  Goals of care: Patient with complicated and prolonged hospitalization with ongoing bacteremia from multiple organisms, with other issues including diabetes liver cirrhosis, a stroke and ongoing encephalopathy, prognosis appears guarded at this time.  Palliative care consultation has been requested to follow along. Remains full code.  I had called and explained to the wife about his overall poor clinical condition. Monitor closely at risk of decompensation.  Diet Order            DIET -  DYS 1 Room service appropriate? No; Fluid consistency: Honey Thick  Diet effective now                 Nutrition Problem: Increased nutrient needs Etiology: wound  healing Signs/Symptoms: estimated needs Interventions: Tube feeding Patient's Body mass index is 34.14 kg/m.  Pressure Injury 12/21/20 Buttocks Right Deep Tissue Pressure Injury - Purple or maroon localized area of discolored intact skin or blood-filled blister due to damage of underlying soft tissue from pressure and/or shear. purple with blister area that has  (Active)  12/21/20 1000  Location: Buttocks  Location Orientation: Right  Staging: Deep Tissue Pressure Injury - Purple or maroon localized area of discolored intact skin or blood-filled blister due to damage of underlying soft tissue from pressure and/or shear.  Wound Description (Comments): purple with blister area that has torn  Present on Admission: No     Pressure Injury 12/21/20 Foot Left;Lateral Deep Tissue Pressure Injury - Purple or maroon localized area of discolored intact skin or blood-filled blister due to damage of underlying soft tissue from pressure and/or shear. .5 cm purple area (Active)  12/21/20 0800  Location: Foot  Location Orientation: Left;Lateral  Staging: Deep Tissue Pressure Injury - Purple or maroon localized area of discolored intact skin or blood-filled blister due to damage of underlying soft tissue from pressure and/or shear.  Wound Description (Comments): .5 cm purple area  Present on Admission: No     Pressure Injury 01/04/21 Buttocks Posterior;Right;Mid Stage 3 -  Full thickness tissue loss. Subcutaneous fat may be visible but bone, tendon or muscle are NOT exposed. medial buttocks where rectal tube laid. (tube removed) (Active)  01/04/21 0800  Location: Buttocks  Location Orientation: Posterior;Right;Mid  Staging: Stage 3 -  Full thickness tissue loss. Subcutaneous fat may be visible but bone, tendon or muscle are NOT exposed.  Wound Description (Comments): medial buttocks where rectal tube laid. (tube removed)  Present on Admission: No   DVT prophylaxis: SCDs Start: 12/10/20 0737  Lovenox Code Status:   Code Status: Full Code  Family Communication: plan of care discussed with patient wife over the phone multiple times.   Prognosis is guarded, wife was updated extensively. Status [id number] Remains inpatient appropriate because:Altered mental status, IV treatments appropriate due to intensity of illness or inability to take PO and Inpatient level of care appropriate due to severity of illness  Dispo: The patient is from: Home              Anticipated d/c is to: tbd              Patient currently is not medically stable to d/c.   Difficult to place patient No Unresulted Labs (From admission, onward)          Start     Ordered   01/06/21 0500  CK  Weekly,   R     Question:  Specimen collection method  Answer:  Lab=Lab collect   01/05/21 0837   01/06/21 0500  Basic metabolic panel  Daily,   R     Question:  Specimen collection method  Answer:  Lab=Lab collect   01/05/21 0901   01/06/21 0500  CBC  Daily,   R     Question:  Specimen collection method  Answer:  Lab=Lab collect   01/05/21 0901          Medications reviewed:  Scheduled Meds: . acetaminophen  650 mg Oral Q6H  . aspirin  81 mg Per Tube Daily  .  atorvastatin  40 mg Per Tube Daily  . carvedilol  3.125 mg Oral BID WC  . chlorhexidine  15 mL Mouth Rinse BID  . Chlorhexidine Gluconate Cloth  6 each Topical Daily  . collagenase   Topical Daily  . docusate  50 mg Per Tube Daily  . DULoxetine  30 mg Oral Daily  . enoxaparin (LOVENOX) injection  120 mg Subcutaneous Q12H  . feeding supplement (PROSource TF)  90 mL Per Tube QID  . free water  250 mL Per Tube Q4H  . insulin aspart  0-20 Units Subcutaneous Q4H  . insulin aspart  8 Units Subcutaneous Q4H  . insulin detemir  35 Units Subcutaneous BID  . mouth rinse  15 mL Mouth Rinse q12n4p  . metroNIDAZOLE  500 mg Oral Q12H  . nutrition supplement (JUVEN)  1 packet Per Tube BID BM  . oxyCODONE  10 mg Per Tube Q6H  . polyethylene glycol  17 g Per Tube  Daily  . QUEtiapine  25 mg Oral QHS  . sodium chloride flush  10-40 mL Intracatheter Q12H  . vitamin B-12  100 mcg Per Tube Daily   Continuous Infusions: . sodium chloride Stopped (12/31/20 1020)  . cefTRIAXone (ROCEPHIN)  IV 2 g (01/06/21 1312)  . DAPTOmycin (CUBICIN)  IV 900 mg (01/06/21 2140)  . feeding supplement (OSMOLITE 1.2 CAL) 1,000 mL (01/05/21 0243)    Consultants:see note  Procedures:  CT chest/abd/pelvis: 1.Double-J ureteral stent is stable in position in the left kidney. 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 5.0 cm distal abdominal aortic aneurysm. Recommend follow-up CT/MR every 6 months and vascular consultation. This recommendation follows ACR consensus guidelines: White Paper of the ACR Incidental Findings Committee II on Vascular Findings. J Am Coll Radiol 2013; 10:789-794. 5. Improved bibasilar airspace disease  Antimicrobials: Anti-infectives (From admission, onward)   Start     Dose/Rate Route Frequency Ordered Stop   01/06/21 1000  metroNIDAZOLE (FLAGYL) tablet 500 mg        500 mg Oral Every 12 hours 01/06/21 0903     01/05/21 1700  DAPTOmycin (CUBICIN) 900 mg in sodium chloride 0.9 % IVPB  Status:  Discontinued        9 mg/kg  97.6 kg (Adjusted) 136 mL/hr over 30 Minutes Intravenous Daily 01/05/21 0837 01/05/21 0845   01/05/21 1500  DAPTOmycin (CUBICIN) 900 mg in sodium chloride 0.9 % IVPB        9 mg/kg  97.6 kg (Adjusted) 136 mL/hr over 30 Minutes Intravenous Daily 01/05/21 0845     01/05/21 0800  cefTRIAXone (ROCEPHIN) 2 g in sodium chloride 0.9 % 100 mL IVPB        2 g 200 mL/hr over 30 Minutes Intravenous Every 24 hours 01/04/21 0436     01/04/21 0530  cefTRIAXone (ROCEPHIN) 2 g in sodium chloride 0.9 % 100 mL IVPB        2 g 200 mL/hr over 30 Minutes Intravenous  Once 01/04/21 0436 01/04/21 0709   01/04/21 0500  vancomycin (VANCOREADY) IVPB 1000 mg/200 mL   Status:  Discontinued        1,000 mg 200 mL/hr over 60 Minutes Intravenous Every 12 hours 01/03/21 2216 01/05/21 0837   01/01/21 0600  vancomycin (VANCOREADY) IVPB 500 mg/100 mL  Status:  Discontinued        500 mg 100 mL/hr over 60 Minutes Intravenous Every 12 hours 12/31/20 1036  01/03/21 2216   12/29/20 0600  vancomycin (VANCOREADY) IVPB 1000 mg/200 mL  Status:  Discontinued        1,000 mg 200 mL/hr over 60 Minutes Intravenous Every 24 hours 12/28/20 0713 12/31/20 1036   12/25/20 0600  vancomycin (VANCOREADY) IVPB 1500 mg/300 mL  Status:  Discontinued        1,500 mg 150 mL/hr over 120 Minutes Intravenous Every 24 hours 12/24/20 1840 12/28/20 0713   12/24/20 1915  vancomycin (VANCOCIN) 2,500 mg in sodium chloride 0.9 % 500 mL IVPB        2,500 mg 250 mL/hr over 120 Minutes Intravenous  Once 12/24/20 1820 12/24/20 2147   12/24/20 1000  cefTRIAXone (ROCEPHIN) 2 g in sodium chloride 0.9 % 100 mL IVPB  Status:  Discontinued        2 g 200 mL/hr over 30 Minutes Intravenous Every 24 hours 12/24/20 0856 12/25/20 1607   12/17/20 2200  cefTRIAXone (ROCEPHIN) 2 g in sodium chloride 0.9 % 100 mL IVPB        2 g 200 mL/hr over 30 Minutes Intravenous Every 24 hours 12/17/20 1123 12/21/20 2135   12/16/20 1100  ceFEPIme (MAXIPIME) 2 g in sodium chloride 0.9 % 100 mL IVPB  Status:  Discontinued        2 g 200 mL/hr over 30 Minutes Intravenous Every 12 hours 12/16/20 0946 12/17/20 1123   12/14/20 1400  ceFAZolin (ANCEF) IVPB 2g/100 mL premix  Status:  Discontinued        2 g 200 mL/hr over 30 Minutes Intravenous Every 8 hours 12/14/20 1223 12/16/20 0946   12/10/20 1530  cefTRIAXone (ROCEPHIN) 2 g in sodium chloride 0.9 % 100 mL IVPB  Status:  Discontinued        2 g 200 mL/hr over 30 Minutes Intravenous Every 24 hours 12/10/20 1436 12/14/20 1223   12/10/20 1200  piperacillin-tazobactam (ZOSYN) IVPB 2.25 g  Status:  Discontinued        2.25 g 100 mL/hr over 30 Minutes Intravenous Every 6 hours  12/10/20 0740 12/10/20 1436   12/10/20 0600  vancomycin (VANCOREADY) IVPB 1500 mg/300 mL        1,500 mg 150 mL/hr over 120 Minutes Intravenous STAT 12/10/20 0546 12/10/20 0821   12/10/20 0400  vancomycin (VANCOREADY) IVPB 1000 mg/200 mL        1,000 mg 200 mL/hr over 60 Minutes Intravenous  Once 12/10/20 0350 12/10/20 0520   12/10/20 0400  piperacillin-tazobactam (ZOSYN) IVPB 3.375 g        3.375 g 12.5 mL/hr over 240 Minutes Intravenous  Once 12/10/20 0350 12/10/20 0450     Culture/Microbiology    Component Value Date/Time   SDES URINE, RANDOM 01/05/2021 0738   SPECREQUEST NONE 01/05/2021 0738   CULT  01/05/2021 0738    NO GROWTH Performed at [facility], [address] [place], [place] [address]    REPTSTATUS 01/06/2021 FINAL 01/05/2021 3244    Other culture-see note  Objective: Vitals: Today's Vitals   01/06/21 2316 01/07/21 0000 01/07/21 0328 01/07/21 0813  BP: 118/69  (!) 141/79 135/79  Pulse: 100  98 (!) 102  Resp: (!) 24  20   Temp: 99 F (37.2 C)  99.2 F (37.3 C)   TempSrc: Axillary  Axillary   SpO2: 95%  96%   Weight:      Height:      PainSc:  0-No pain      Intake/Output Summary (Last 24 hours) at  01/07/2021 0814 Last data filed at 01/07/2021 0500 Gross per 24 hour  Intake --  Output 3175 ml  Net -3175 ml   Filed Weights   12/28/20 0345 12/29/20 0400 01/03/21 0500  Weight: 127.8 kg 126.7 kg 120.6 kg   Weight change:   Intake/Output from previous day: 04/27 0701 - 04/28 0700 In: -  Out: 3175 [Urine:3175] Intake/Output this shift: No intake/output data recorded. Filed Weights   12/28/20 0345 12/29/20 0400 01/03/21 0500  Weight: 127.8 kg 126.7 kg 120.6 kg    Examination: General exam: AAOx current place, president, self, smiling, older than stated age, weak appearing. HEENT:Oral mucosa moist, Ear/Nose WNL grossly, dentition normal. Respiratory system: bilaterally diminished, mild basal crackles,no use of accessory  muscle Cardiovascular system: S1 & S2 +, No JVD,. Gastrointestinal system: Abdomen soft, obese NT,ND, BS+ Nervous System:Alert, awake, moving extremities and grossly nonfocal Extremities: no edema, distal peripheral pulses palpable.  Skin: No rashes,no icterus. MSK: Normal muscle bulk,tone, power Sacral wound present with dressing in place Foley catheter in place     Data Reviewed: I have personally reviewed following labs and imaging studies CBC: Recent Labs  Lab 01/03/21 0243 01/04/21 0247 01/05/21 0800 01/06/21 0447 01/07/21 0301  WBC 10.8* 11.7* 11.7* 13.9* 9.7  NEUTROABS  --   --  9.6*  --   --   HGB 10.0* 9.5* 9.7* 9.6* 9.0*  HCT 31.6* 29.9* 30.5* 31.2* 29.0*  MCV 99.7 98.7 98.4 101.3* 100.7*  PLT 175 190 186 201 188   Basic Metabolic Panel: Recent Labs  Lab 01/02/21 0020 01/03/21 0243 01/04/21 0247 01/05/21 0622 01/06/21 0447 01/07/21 0301  NA 145 143 144 146* 146* 146*  K 4.0 4.1 3.4* 3.7 3.8 3.5  CL 111 108 108 111 112* 108  CO2 28 27 27 27 25 30   GLUCOSE 166* 195* 106* 136* 182* 131*  BUN 55* 54* 57* 56* 52* 54*  CREATININE 1.20 1.05 0.99 1.03 1.00 0.94  CALCIUM 8.8* 8.9 8.7* 8.7* 8.8* 8.8*  MG 2.2  --   --   --   --   --    GFR: Estimated Creatinine Clearance: 118.3 mL/min (by C-G formula based on SCr of 0.94 mg/dL). Liver Function Tests: Recent Labs  Lab 12/31/20 1151 01/02/21 0020  AST 21 22  ALT 16 14  ALKPHOS 58 62  BILITOT 0.5 0.5  PROT 7.4 7.2  ALBUMIN 2.3* 2.2*   No results for input(s): LIPASE, AMYLASE in the last 168 hours. Recent Labs  Lab 01/04/21 1050  AMMONIA 34   Coagulation Profile: No results for input(s): INR, PROTIME in the last 168 hours. Cardiac Enzymes: Recent Labs  Lab 01/06/21 0447  CKTOTAL 129   BNP (last 3 results) No results for input(s): PROBNP in the last 8760 hours. HbA1C: No results for input(s): HGBA1C in the last 72 hours. CBG: Recent Labs  Lab 01/06/21 1230 01/06/21 1823 01/06/21 2008  01/06/21 2315 01/07/21 0327  GLUCAP 194* 133* 126* 159* 123*   Lipid Profile: No results for input(s): CHOL, HDL, LDLCALC, TRIG, CHOLHDL, LDLDIRECT in the last 72 hours. Thyroid Function Tests: No results for input(s): TSH, T4TOTAL, FREET4, T3FREE, THYROIDAB in the last 72 hours. Anemia Panel: No results for input(s): VITAMINB12, FOLATE, FERRITIN, TIBC, IRON, RETICCTPCT in the last 72 hours. Sepsis Labs: No results for input(s): PROCALCITON, LATICACIDVEN in the last 168 hours.  Recent Results (from the past 240 hour(s))  Culture, Urine     Status: None   Collection Time: 01/02/21  4:06 AM   Specimen: Urine, Random  Result Value Ref Range Status   Specimen Description URINE, RANDOM  Final   Special Requests NONE  Final   Culture   Final    NO GROWTH Performed at [facility], [address] [place], [place] [address]    Report Status 01/03/2021 FINAL  Final  Culture, blood (routine x 2)     Status: Abnormal   Collection Time: 01/02/21  4:19 AM   Specimen: BLOOD LEFT HAND  Result Value Ref Range Status   Specimen Description BLOOD LEFT HAND  Final   Special Requests AEROBIC BOTTLE ONLY Blood Culture adequate volume  Final   Culture  Setup Time   Final    GRAM POSITIVE COCCI IN CLUSTERS AEROBIC BOTTLE ONLY CRITICAL VALUE NOTED.  VALUE IS CONSISTENT WITH PREVIOUSLY REPORTED AND CALLED VALUE.    Culture (A)  Final    STAPHYLOCOCCUS EPIDERMIDIS SUSCEPTIBILITIES PERFORMED ON PREVIOUS CULTURE WITHIN THE LAST 5 DAYS. Performed at [facility], [address] [place], [place] [address]    Report Status 01/05/2021 FINAL  Final  Culture, blood (routine x 2)     Status: Abnormal   Collection Time: 01/02/21  4:32 AM   Specimen: BLOOD LEFT HAND  Result Value Ref Range Status   Specimen Description BLOOD LEFT HAND  Final   Special Requests   Final    BOTTLES DRAWN AEROBIC AND ANAEROBIC Blood Culture results may not be optimal due to an inadequate volume of blood  received in culture bottles   Culture  Setup Time   Final    GRAM POSITIVE COCCI ANAEROBIC BOTTLE ONLY CRITICAL RESULT CALLED TO, READ BACK BY AND VERIFIED WITH: PHARMD GREG ABBOTT 01/03/2021 AT 0630 A.HUGHES Performed at [facility], [address], [place], [place] [address]    Culture STAPHYLOCOCCUS EPIDERMIDIS (A)  Final   Report Status 01/05/2021 FINAL  Final   Organism ID, Bacteria STAPHYLOCOCCUS EPIDERMIDIS  Final      Susceptibility   Staphylococcus epidermidis - MIC*    CIPROFLOXACIN >=8 RESISTANT Resistant     ERYTHROMYCIN >=8 RESISTANT Resistant     GENTAMICIN >=16 RESISTANT Resistant     OXACILLIN >=4 RESISTANT Resistant     TETRACYCLINE 2 SENSITIVE Sensitive     VANCOMYCIN 1 SENSITIVE Sensitive     TRIMETH/SULFA 80 RESISTANT Resistant     CLINDAMYCIN >=8 RESISTANT Resistant     RIFAMPIN <=0.5 SENSITIVE Sensitive     Inducible Clindamycin NEGATIVE Sensitive     * STAPHYLOCOCCUS EPIDERMIDIS  Blood Culture ID Panel (Reflexed)     Status: Abnormal   Collection Time: 01/02/21  4:32 AM  Result Value Ref Range Status   Enterococcus faecalis NOT DETECTED NOT DETECTED Final   Enterococcus Faecium NOT DETECTED NOT DETECTED Final   Listeria monocytogenes NOT DETECTED NOT DETECTED Final   Staphylococcus species DETECTED (A) NOT DETECTED Final    Comment: CRITICAL RESULT CALLED TO, READ BACK BY AND VERIFIED WITH: PHARMD GREG ABBOTT 01/03/2021 AT 0630 A.HUGHES    Staphylococcus aureus (BCID) NOT DETECTED NOT DETECTED Final   Staphylococcus epidermidis DETECTED (A) NOT DETECTED Final    Comment: Methicillin (oxacillin) resistant coagulase negative staphylococcus. Possible blood culture contaminant (unless isolated from more than one blood culture draw or clinical case suggests pathogenicity). No antibiotic treatment is indicated for blood  culture contaminants. CRITICAL RESULT CALLED TO, READ BACK BY AND VERIFIED WITH: PHARMD GREG ABBOTT 01/03/2021 AT 0630 A.HUGHES  Staphylococcus lugdunensis NOT DETECTED NOT DETECTED Final   Streptococcus species NOT DETECTED NOT DETECTED Final   Streptococcus agalactiae NOT DETECTED NOT DETECTED Final   Streptococcus pneumoniae NOT DETECTED NOT DETECTED Final   Streptococcus pyogenes NOT DETECTED NOT DETECTED Final   A.calcoaceticus-baumannii NOT DETECTED NOT DETECTED Final   Bacteroides fragilis NOT DETECTED NOT DETECTED Final   Enterobacterales NOT DETECTED NOT DETECTED Final   Enterobacter cloacae complex NOT DETECTED NOT DETECTED Final   Escherichia coli NOT DETECTED NOT DETECTED Final   Klebsiella aerogenes NOT DETECTED NOT DETECTED Final   Klebsiella oxytoca NOT DETECTED NOT DETECTED Final   Klebsiella pneumoniae NOT DETECTED NOT DETECTED Final   Proteus species NOT DETECTED NOT DETECTED Final   Salmonella species NOT DETECTED NOT DETECTED Final   Serratia marcescens NOT DETECTED NOT DETECTED Final   Haemophilus influenzae NOT DETECTED NOT DETECTED Final   Neisseria meningitidis NOT DETECTED NOT DETECTED Final   Pseudomonas aeruginosa NOT DETECTED NOT DETECTED Final   Stenotrophomonas maltophilia NOT DETECTED NOT DETECTED Final   Candida albicans NOT DETECTED NOT DETECTED Final   Candida auris NOT DETECTED NOT DETECTED Final   Candida glabrata NOT DETECTED NOT DETECTED Final   Candida krusei NOT DETECTED NOT DETECTED Final   Candida parapsilosis NOT DETECTED NOT DETECTED Final   Candida tropicalis NOT DETECTED NOT DETECTED Final   Cryptococcus neoformans/gattii NOT DETECTED NOT DETECTED Final   Methicillin resistance mecA/C DETECTED (A) NOT DETECTED Final    Comment: CRITICAL RESULT CALLED TO, READ BACK BY AND VERIFIED WITH: PHARMD GREG ABBOTT 01/03/2021 AT 0630 A.HUGHES Performed at [facility], [address] [place], [place] [address]   Culture, blood (Routine X 2) w Reflex to ID Panel     Status: Abnormal   Collection Time: 01/03/21 11:31 AM   Specimen: BLOOD LEFT HAND  Result Value Ref  Range Status   Specimen Description BLOOD LEFT HAND  Final   Special Requests   Final    BOTTLES DRAWN AEROBIC ONLY Blood Culture adequate volume   Culture  Setup Time   Final    GRAM NEGATIVE RODS AEROBIC BOTTLE ONLY PHARMD GREG ABBOTT 01/04/2021 AT 0425 A.HUGHES CRITICAL VALUE NOTED.  VALUE IS CONSISTENT WITH PREVIOUSLY REPORTED AND CALLED VALUE.    Culture (A)  Final    ESCHERICHIA COLI SUSCEPTIBILITIES PERFORMED ON PREVIOUS CULTURE WITHIN THE LAST 5 DAYS. Performed at [facility], [address] [place], [place] [address]    Report Status 01/06/2021 FINAL  Final  Culture, blood (Routine X 2) w Reflex to ID Panel     Status: Abnormal   Collection Time: 01/03/21 11:31 AM   Specimen: BLOOD LEFT FOREARM  Result Value Ref Range Status   Specimen Description BLOOD LEFT FOREARM  Final   Special Requests   Final    BOTTLES DRAWN AEROBIC ONLY Blood Culture adequate volume   Culture  Setup Time   Final    GRAM NEGATIVE RODS AEROBIC BOTTLE ONLY CRITICAL RESULT CALLED TO, READ BACK BY AND VERIFIED WITH: PHARMD GREG ABBOTT 01/04/2021 AT 0425 A.HUGHES Performed at [facility], [address], [place], [place] [address]    Culture ESCHERICHIA COLI (A)  Final   Report Status 01/06/2021 FINAL  Final   Organism ID, Bacteria ESCHERICHIA COLI  Final      Susceptibility   Escherichia coli - MIC*    AMPICILLIN 8 SENSITIVE Sensitive     CEFAZOLIN <=4 SENSITIVE Sensitive     CEFEPIME <=  0.12 SENSITIVE Sensitive     CEFTAZIDIME <=1 SENSITIVE Sensitive     CEFTRIAXONE <=0.25 SENSITIVE Sensitive     CIPROFLOXACIN <=0.25 SENSITIVE Sensitive     GENTAMICIN <=1 SENSITIVE Sensitive     IMIPENEM <=0.25 SENSITIVE Sensitive     TRIMETH/SULFA <=20 SENSITIVE Sensitive     AMPICILLIN/SULBACTAM <=2 SENSITIVE Sensitive     PIP/TAZO <=4 SENSITIVE Sensitive     * ESCHERICHIA COLI  Blood Culture ID Panel (Reflexed)     Status: Abnormal   Collection Time: 01/03/21 11:31 AM  Result Value Ref  Range Status   Enterococcus faecalis NOT DETECTED NOT DETECTED Final   Enterococcus Faecium NOT DETECTED NOT DETECTED Final   Listeria monocytogenes NOT DETECTED NOT DETECTED Final   Staphylococcus species NOT DETECTED NOT DETECTED Final   Staphylococcus aureus (BCID) NOT DETECTED NOT DETECTED Final   Staphylococcus epidermidis NOT DETECTED NOT DETECTED Final   Staphylococcus lugdunensis NOT DETECTED NOT DETECTED Final   Streptococcus species NOT DETECTED NOT DETECTED Final   Streptococcus agalactiae NOT DETECTED NOT DETECTED Final   Streptococcus pneumoniae NOT DETECTED NOT DETECTED Final   Streptococcus pyogenes NOT DETECTED NOT DETECTED Final   A.calcoaceticus-baumannii NOT DETECTED NOT DETECTED Final   Bacteroides fragilis NOT DETECTED NOT DETECTED Final   Enterobacterales DETECTED (A) NOT DETECTED Final    Comment: Enterobacterales represent a large order of gram negative bacteria, not a single organism. CRITICAL RESULT CALLED TO, READ BACK BY AND VERIFIED WITH: PHARMD GREG ABBOTT 01/04/2021 AT 0425 A.HUGHES    Enterobacter cloacae complex NOT DETECTED NOT DETECTED Final   Escherichia coli DETECTED (A) NOT DETECTED Final    Comment: CRITICAL RESULT CALLED TO, READ BACK BY AND VERIFIED WITH: PHARMD GREG ABBOTT 01/04/2021 AT 0425 A.HUGHES    Klebsiella aerogenes NOT DETECTED NOT DETECTED Final   Klebsiella oxytoca NOT DETECTED NOT DETECTED Final   Klebsiella pneumoniae NOT DETECTED NOT DETECTED Final   Proteus species NOT DETECTED NOT DETECTED Final   Salmonella species NOT DETECTED NOT DETECTED Final   Serratia marcescens NOT DETECTED NOT DETECTED Final   Haemophilus influenzae NOT DETECTED NOT DETECTED Final   Neisseria meningitidis NOT DETECTED NOT DETECTED Final   Pseudomonas aeruginosa NOT DETECTED NOT DETECTED Final   Stenotrophomonas maltophilia NOT DETECTED NOT DETECTED Final   Candida albicans NOT DETECTED NOT DETECTED Final   Candida auris NOT DETECTED NOT DETECTED  Final   Candida glabrata NOT DETECTED NOT DETECTED Final   Candida krusei NOT DETECTED NOT DETECTED Final   Candida parapsilosis NOT DETECTED NOT DETECTED Final   Candida tropicalis NOT DETECTED NOT DETECTED Final   Cryptococcus neoformans/gattii NOT DETECTED NOT DETECTED Final   CTX-M ESBL NOT DETECTED NOT DETECTED Final   Carbapenem resistance IMP NOT DETECTED NOT DETECTED Final   Carbapenem resistance KPC NOT DETECTED NOT DETECTED Final   Carbapenem resistance NDM NOT DETECTED NOT DETECTED Final   Carbapenem resist OXA 48 LIKE NOT DETECTED NOT DETECTED Final   Carbapenem resistance VIM NOT DETECTED NOT DETECTED Final    Comment: Performed at [facility], [address], [place], [place] [address]  Culture, blood (routine x 2)     Status: None (Preliminary result)   Collection Time: 01/05/21  6:10 AM   Specimen: BLOOD RIGHT HAND  Result Value Ref Range Status   Specimen Description BLOOD RIGHT HAND  Final   Special Requests   Final    BOTTLES DRAWN AEROBIC ONLY Blood Culture adequate volume   Culture   Final  NO GROWTH 2 DAYS Performed at [facility], [address] [place], [place] [address]    Report Status PENDING  Incomplete  Culture, blood (routine x 2)     Status: None (Preliminary result)   Collection Time: 01/05/21  6:11 AM   Specimen: BLOOD RIGHT HAND  Result Value Ref Range Status   Specimen Description BLOOD RIGHT HAND  Final   Special Requests   Final    BOTTLES DRAWN AEROBIC ONLY Blood Culture adequate volume   Culture   Final    NO GROWTH 2 DAYS Performed at [facility], [address], [place], [place] [address]    Report Status PENDING  Incomplete  Culture, Urine     Status: None   Collection Time: 01/05/21  7:38 AM   Specimen: Urine, Random  Result Value Ref Range Status   Specimen Description URINE, RANDOM  Final   Special Requests NONE  Final   Culture   Final    NO GROWTH Performed at [facility], [address],  [place], [place] [address]    Report Status 01/06/2021 FINAL  Final     Radiology Studies: CT ABDOMEN PELVIS WO CONTRAST  Result Date: 01/05/2021 CLINICAL DATA:  Ureteral stent displacement. EXAM: CT ABDOMEN AND PELVIS WITHOUT CONTRAST TECHNIQUE: Multidetector CT imaging of the abdomen and pelvis was performed following the standard protocol without IV contrast. COMPARISON:  CT of the abdomen and pelvis 12/23/2020 FINDINGS: Lower chest: Bibasilar airspace disease is improved. No significant effusion or pneumothorax is present. Heart size is normal. NG tube is in place. Hepatobiliary: No focal liver abnormality is seen. No gallstones, gallbladder wall thickening, or biliary dilatation. Pancreas: Unremarkable. No pancreatic ductal dilatation or surrounding inflammatory changes. Spleen: Normal in size without focal abnormality. Adrenals/Urinary Tract: Adrenal glands are normal bilaterally. Right kidney is within normal limits. Ureters unremarkable. Double-J ureteral stent is stable in position in the left kidney. 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 Foley catheter is present in the bladder. Bladder is otherwise decompressed. Stomach/Bowel: Stomach is unremarkable. Duodenum is normal. Small bowel is within normal limits. Terminal ileum is within normal limits. The appendix is not discretely visualized and may be surgically absent. Contrast is present throughout the colon. Colon is unremarkable. Vascular/Lymphatic: Atherosclerotic calcifications are present in the aorta and branch vessels. Distal aortic aneurysm measuring up to 5.0 cm again noted. Proximal left iliac artery aneurysm measuring 4.2 cm noted. No aneurysm is present a right iliac artery. No additional aortic aneurysm is present. Reproductive: Prostate is unremarkable. Other: No abdominal wall hernia or abnormality. No abdominopelvic ascites. Musculoskeletal: Vertebral body heights maintained. No significant  listhesis is present. Levoconvex scoliosis stable. No focal lytic or blastic lesions are present. Hips are located and within normal limits. IMPRESSION: 1. Double-J ureteral stent is stable in position in the left kidney. 2. 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 3. 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 4. 5.0 cm distal abdominal aortic aneurysm. Recommend follow-up CT/MR every 6 months and vascular consultation. This recommendation follows ACR consensus guidelines: White Paper of the ACR Incidental Findings Committee II on Vascular Findings. J Am Coll Radiol 2013; 10:789-794. 5. Improved bibasilar airspace disease. 6. Aortic Atherosclerosis (ICD10-I70.0). Electronically Signed   By: Marin Roberts M.D.   On: 01/05/2021 13:04   CT HEAD WO CONTRAST  Result Date: 01/05/2021 CLINICAL DATA:  Delirium.  Altered  mental status. EXAM: CT HEAD WITHOUT CONTRAST TECHNIQUE: Contiguous axial images were obtained from the base of the skull through the vertex without intravenous contrast. COMPARISON:  MRI/MRA head 12/22/2020. FINDINGS: Brain: Mildly motion degraded exam. 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 There is no acute intracranial hemorrhage. No demarcated cortical infarct. No extra-axial fluid collection. No evidence of intracranial mass. No midline shift. Vascular: No hyperdense vessel.  Atherosclerotic calcifications Skull: Normal. Negative for fracture or focal lesion. Sinuses/Orbits: Visualized orbits show no acute finding. Postsurgical appearance of the paranasal sinuses. Mucosal thickening and frothy secretions within the left frontal sinus. Mild mucosal thickening within the bilateral ethmoidectomy cavities. Near complete opacification of the sphenoid sinuses. Mild-to-moderate mucosal thickening, small volume frothy secretions and small mucous retention cysts  within the right maxillary sinus. Mild mucosal thickening and small mucous retention cysts within the left maxillary sinus. Other: Bilateral mastoid effusions.  Nasoenteric tube. IMPRESSION: Mildly motion degraded examination. No evidence of acute intracranial abnormality. 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 Paranasal sinus disease, as described. Bilateral mastoid effusions. Electronically Signed   By: Jackey Loge DO   On: 01/05/2021 10:17     LOS: 28 days   Lanae Boast, MD [facility]  01/07/2021, 8:14 AM

## 2021-01-07 NOTE — Progress Notes (Signed)
[facility] Infectious Diseases Follow Up Note  Patient Identification: Patient Name: [name] MRN: [id number] [place] Date: 12/10/2020  3:36 AM Age: 60 y.o.Today's Date: 01/07/2021   Reason for Visit: Follow-up on fevers and bacteremia  Active Problems:   Acute respiratory failure ([place])   AKI (acute kidney injury) ([place])   Bacteremia due to Escherichia coli   Ureteral stone   Pyelonephritis   Cerebral thrombosis with cerebral infarction   Staphylococcus epidermidis bacteremia   Pressure injury of skin    Antibiotics:Ceftriaxone 3/31-4/3,4/7-4/11,4/14-4/15,ceftriaxone 4/24- Vancomycin 4/14-4/25, Daptomycin 4/26 - current                    Metronidazole 4/27- current  Lines/Tubes:PIV's,urethral catheter,NG tube   Interval Events: Febrile yesterday afternoon, leukocytosis downtrending, palliative care has been consulted for goals of care discussion. Blood cx 4/26 and urine cx 4/26 NG    Assessment Fevers: last episode of fevers was yesterday afternoon, differentials: rt buttock ulcer vs aspiration vs bacteremia vs others   MRSE bacteremia( clabsi) with possible septic thrombophlebitis of left IJ and left subclavian vein status post removal of PICC line on 4/24.  Doppler on 4/25 DVT in left IJ and left subclavian vein has been resolved  MSSA pneumonia  Recurrent E. coli bacteremia in the setting of prior E. coli bacteremia and pyelonephritis complicated by hydronephrosis status post stent placement on 4/6 and appropriate antibiotic treatment  Left Ureteral stone  Liver cirrhosis  Recommendations Continue Daptomycin, will plan to treat for 6 weeks from 4/24 for septic thrombophlebitis Will switch ceftriaxone to cefazolin for E coli bacteremia - Plan to treat for approx 10 days with clinical improvement  Continue Metronidazole for 10 days Following surgery recs and palliative care recs. Will  recommend a TEE if congruent with [place] discussion as stated previously   Dr Gale Journey will follow from tomorrow and over the weekend.  Rest of the management as per the primary team. Thank you for the consult. Please page with pertinent questions or concerns.  ______________________________________________________________________ Subjective patient seen and examined at the bedside. He is more awake, alert and verbal today.    Vitals BP 135/79 (BP Location: Left Leg)   Pulse (!) 102   Temp 99.2 F (37.3 C) (Axillary)   Resp (!) 28   Ht _0  (1.88 m)   Wt 120.6 kg   SpO2 93%   BMI 34.14 kg/m   Physical Exam Looks more awake, alert and responsive NGT Mouth dry with coating CVS- Normal heart sounds Chest - mostly clear anteriorly, occasional rales  Abdomen - soft Extremities- left upper extremity weakness  Skin - no obvious rashes   Pertinent Microbiology Results for orders placed or performed during the hospital encounter of 12/10/20  Culture, blood (routine x 2)     Status: Abnormal   Collection Time: 12/10/20  3:50 AM   Specimen: BLOOD  Result Value Ref Range Status   Specimen Description BLOOD LEFT ANTECUBITAL  Final   Special Requests   Final    BOTTLES DRAWN AEROBIC AND ANAEROBIC Blood Culture adequate volume   Culture  Setup Time   Final    GRAM NEGATIVE RODS IN BOTH AEROBIC AND ANAEROBIC BOTTLES CRITICAL RESULT CALLED TO, READ BACK BY AND VERIFIED WITHJeanette Caprice [facility] 5597 12/10/20 A BROWNING Performed at [facility], [address] [place], [place] [address]    Culture ESCHERICHIA COLI (A)  Final   Report Status 12/12/2020 FINAL  Final   Organism ID, Bacteria  ESCHERICHIA COLI  Final      Susceptibility   Escherichia coli - MIC*    AMPICILLIN 8 SENSITIVE Sensitive     CEFAZOLIN <=4 SENSITIVE Sensitive     CEFEPIME <=0.12 SENSITIVE Sensitive     CEFTAZIDIME <=1 SENSITIVE Sensitive     CEFTRIAXONE <=0.25 SENSITIVE Sensitive     CIPROFLOXACIN <=0.25  SENSITIVE Sensitive     GENTAMICIN <=1 SENSITIVE Sensitive     IMIPENEM <=0.25 SENSITIVE Sensitive     TRIMETH/SULFA <=20 SENSITIVE Sensitive     AMPICILLIN/SULBACTAM <=2 SENSITIVE Sensitive     PIP/TAZO <=4 SENSITIVE Sensitive     * ESCHERICHIA COLI  Blood Culture ID Panel (Reflexed)     Status: Abnormal   Collection Time: 12/10/20  3:50 AM  Result Value Ref Range Status   Enterococcus faecalis NOT DETECTED NOT DETECTED Final   Enterococcus Faecium NOT DETECTED NOT DETECTED Final   Listeria monocytogenes NOT DETECTED NOT DETECTED Final   Staphylococcus species NOT DETECTED NOT DETECTED Final   Staphylococcus aureus (BCID) NOT DETECTED NOT DETECTED Final   Staphylococcus epidermidis NOT DETECTED NOT DETECTED Final   Staphylococcus lugdunensis NOT DETECTED NOT DETECTED Final   Streptococcus species NOT DETECTED NOT DETECTED Final   Streptococcus agalactiae NOT DETECTED NOT DETECTED Final   Streptococcus pneumoniae NOT DETECTED NOT DETECTED Final   Streptococcus pyogenes NOT DETECTED NOT DETECTED Final   A.calcoaceticus-baumannii NOT DETECTED NOT DETECTED Final   Bacteroides fragilis NOT DETECTED NOT DETECTED Final   Enterobacterales DETECTED (A) NOT DETECTED Final    Comment: Enterobacterales represent a large order of gram negative bacteria, not a single organism. CRITICAL RESULT CALLED TO, READ BACK BY AND VERIFIED WITH: Jeanette Caprice PHARMD [id number] 12/10/20 A BROWNING    Enterobacter cloacae complex NOT DETECTED NOT DETECTED Final   Escherichia coli DETECTED (A) NOT DETECTED Final    Comment: CRITICAL RESULT CALLED TO, READ BACK BY AND VERIFIED WITH: Jeanette Caprice PHARMD [id number] 12/10/20 A BROWNING    Klebsiella aerogenes NOT DETECTED NOT DETECTED Final   Klebsiella oxytoca NOT DETECTED NOT DETECTED Final   Klebsiella pneumoniae NOT DETECTED NOT DETECTED Final   Proteus species NOT DETECTED NOT DETECTED Final   Salmonella species NOT DETECTED NOT DETECTED Final   Serratia marcescens NOT  DETECTED NOT DETECTED Final   Haemophilus influenzae NOT DETECTED NOT DETECTED Final   Neisseria meningitidis NOT DETECTED NOT DETECTED Final   Pseudomonas aeruginosa NOT DETECTED NOT DETECTED Final   Stenotrophomonas maltophilia NOT DETECTED NOT DETECTED Final   Candida albicans NOT DETECTED NOT DETECTED Final   Candida auris NOT DETECTED NOT DETECTED Final   Candida glabrata NOT DETECTED NOT DETECTED Final   Candida krusei NOT DETECTED NOT DETECTED Final   Candida parapsilosis NOT DETECTED NOT DETECTED Final   Candida tropicalis NOT DETECTED NOT DETECTED Final   Cryptococcus neoformans/gattii NOT DETECTED NOT DETECTED Final   CTX-M ESBL NOT DETECTED NOT DETECTED Final   Carbapenem resistance IMP NOT DETECTED NOT DETECTED Final   Carbapenem resistance KPC NOT DETECTED NOT DETECTED Final   Carbapenem resistance NDM NOT DETECTED NOT DETECTED Final   Carbapenem resist OXA 48 LIKE NOT DETECTED NOT DETECTED Final   Carbapenem resistance VIM NOT DETECTED NOT DETECTED Final    Comment: Performed at [facility], [address] [place], [place] [address]  Resp Panel by RT-PCR (Flu A&B, Covid) Nasopharyngeal Swab     Status: None   Collection Time: 12/10/20  3:59 AM   Specimen: Nasopharyngeal Swab; Nasopharyngeal(NP)  swabs in vial transport medium  Result Value Ref Range Status   SARS Coronavirus 2 by RT PCR NEGATIVE NEGATIVE Final    Comment: (NOTE) SARS-CoV-2 target nucleic acids are NOT DETECTED.  The SARS-CoV-2 RNA is generally detectable in upper respiratory specimens during the acute phase of infection. The lowest concentration of SARS-CoV-2 viral copies this assay can detect is 138 copies/mL. A negative result does not preclude SARS-Cov-2 infection and should not be used as the sole basis for treatment or other patient management decisions. A negative result may occur with  improper specimen collection/handling, submission of specimen other than nasopharyngeal swab, presence  of viral mutation(s) within the areas targeted by this assay, and inadequate number of viral copies(<138 copies/mL). A negative result must be combined with clinical observations, patient history, and epidemiological information. The expected result is Negative.  Fact Sheet for Patients:  EntrepreneurPulse.com.au  Fact Sheet for Healthcare Providers:  IncredibleEmployment.be  This test is no t yet approved or cleared by the [place] FDA and  has been authorized for detection and/or diagnosis of SARS-CoV-2 by FDA under an Emergency Use Authorization (EUA). This EUA will remain  in effect (meaning this test can be used) for the duration of the COVID-19 declaration under Section 564(b)(1) of the Act, 21 U.S.C.section 360bbb-3(b)(1), unless the authorization is terminated  or revoked sooner.       Influenza A by PCR NEGATIVE NEGATIVE Final   Influenza B by PCR NEGATIVE NEGATIVE Final    Comment: (NOTE) The Xpert Xpress SARS-CoV-2/FLU/RSV plus assay is intended as an aid in the diagnosis of influenza from Nasopharyngeal swab specimens and should not be used as a sole basis for treatment. Nasal washings and aspirates are unacceptable for Xpert Xpress SARS-CoV-2/FLU/RSV testing.  Fact Sheet for Patients: EntrepreneurPulse.com.au  Fact Sheet for Healthcare Providers: IncredibleEmployment.be  This test is not yet approved or cleared by the [place] FDA and has been authorized for detection and/or diagnosis of SARS-CoV-2 by FDA under an Emergency Use Authorization (EUA). This EUA will remain in effect (meaning this test can be used) for the duration of the COVID-19 declaration under Section 564(b)(1) of the Act, 21 U.S.C. section 360bbb-3(b)(1), unless the authorization is terminated or revoked.  Performed at [facility], [place] [address] [place], [place] [address]   Culture, blood (routine x  2)     Status: Abnormal   Collection Time: 12/10/20  4:06 AM   Specimen: BLOOD RIGHT HAND  Result Value Ref Range Status   Specimen Description BLOOD RIGHT HAND  Final   Special Requests   Final    BOTTLES DRAWN AEROBIC AND ANAEROBIC Blood Culture results may not be optimal due to an inadequate volume of blood received in culture bottles   Culture  Setup Time   Final    GRAM NEGATIVE RODS IN BOTH AEROBIC AND ANAEROBIC BOTTLES IDENTIFICATION TO FOLLOW CRITICAL RESULT CALLED TO, READ BACK BY AND VERIFIED WITHJeanette Caprice PHARMD 1431 12/10/20 A BROWNING    Culture (A)  Final    ESCHERICHIA COLI SUSCEPTIBILITIES PERFORMED ON PREVIOUS CULTURE WITHIN THE LAST 5 DAYS. Performed at [facility], [place] [address] [place], [place] [address]    Report Status 12/12/2020 FINAL  Final  Urine culture     Status: Abnormal   Collection Time: 12/10/20  4:40 AM   Specimen: Urine, Clean Catch  Result Value Ref Range Status   Specimen Description URINE, CLEAN CATCH  Final   Special Requests  Final    NONE Performed at [facility], [place] [address], [place], [place] [address]    Culture >=100,000 COLONIES/mL ESCHERICHIA COLI (A)  Final   Report Status 12/12/2020 FINAL  Final   Organism ID, Bacteria ESCHERICHIA COLI (A)  Final      Susceptibility   Escherichia coli - MIC*    AMPICILLIN 8 SENSITIVE Sensitive     CEFAZOLIN <=4 SENSITIVE Sensitive     CEFEPIME <=0.12 SENSITIVE Sensitive     CEFTRIAXONE <=0.25 SENSITIVE Sensitive     CIPROFLOXACIN <=0.25 SENSITIVE Sensitive     GENTAMICIN <=1 SENSITIVE Sensitive     IMIPENEM <=0.25 SENSITIVE Sensitive     NITROFURANTOIN <=16 SENSITIVE Sensitive     TRIMETH/SULFA <=20 SENSITIVE Sensitive     AMPICILLIN/SULBACTAM <=2 SENSITIVE Sensitive     PIP/TAZO <=4 SENSITIVE Sensitive     * >=100,000 COLONIES/mL ESCHERICHIA COLI  MRSA PCR Screening     Status: None   Collection Time: 12/10/20 11:24 AM   Specimen: Nasal Mucosa; Nasopharyngeal  Result  Value Ref Range Status   MRSA by PCR NEGATIVE NEGATIVE Final    Comment:        The GeneXpert MRSA Assay (FDA approved for NASAL specimens only), is one component of a comprehensive MRSA colonization surveillance program. It is not intended to diagnose MRSA infection nor to guide or monitor treatment for MRSA infections. Performed at [facility], [place] [address] [place], [place] [address]   Culture, Respiratory w Gram Stain     Status: None   Collection Time: 12/15/20  7:39 AM   Specimen: Tracheal Aspirate; Respiratory  Result Value Ref Range Status   Specimen Description TRACHEAL ASPIRATE  Final   Special Requests NONE  Final   Gram Stain   Final    RARE WBC PRESENT, PREDOMINANTLY PMN MODERATE GRAM NEGATIVE RODS RARE GRAM POSITIVE COCCI    Culture   Final    RARE Consistent with normal respiratory flora. No Pseudomonas species isolated Performed at [place] [address] [place], [place] [address]    Report Status 12/17/2020 FINAL  Final  Culture, BAL-quantitative w Gram Stain     Status: Abnormal   Collection Time: 12/15/20  9:36 AM   Specimen: Bronchoalveolar Lavage; Respiratory  Result Value Ref Range Status   Specimen Description BRONCHIAL ALVEOLAR LAVAGE  Final   Special Requests NONE  Final   Gram Stain   Final    FEW WBC PRESENT,BOTH PMN AND MONONUCLEAR NO ORGANISMS SEEN    Culture (A)  Final    3,000 COLONIES/mL Consistent with normal respiratory flora. Performed at [facility], [place] [address], [place], [place] [address]    Report Status 12/17/2020 FINAL  Final  Urine Culture     Status: None   Collection Time: 12/16/20  9:53 PM   Specimen: Urine, Cystoscope  Result Value Ref Range Status   Specimen Description URINE, RANDOM  Final   Special Requests PT ON CEFATAN,CYSTO URINE  Final   Culture   Final    NO GROWTH Performed at [facility], [place] [address], [place], [place] [address]    Report Status 12/18/2020 FINAL   Final  Culture, Respiratory w Gram Stain     Status: None   Collection Time: 12/20/20 11:45 AM   Specimen: Tracheal Aspirate; Respiratory  Result Value Ref Range Status   Specimen Description TRACHEAL ASPIRATE  Final   Special Requests NONE  Final  Gram Stain   Final    ABUNDANT WBC PRESENT, PREDOMINANTLY PMN FEW SQUAMOUS EPITHELIAL CELLS PRESENT MODERATE GRAM POSITIVE COCCI IN CLUSTERS RARE GRAM POSITIVE RODS RARE GRAM NEGATIVE RODS Performed at [facility], [place] [address], [place], [place] [address]    Culture FEW STAPHYLOCOCCUS AUREUS  Final   Report Status 12/24/2020 FINAL  Final   Organism ID, Bacteria STAPHYLOCOCCUS AUREUS  Final      Susceptibility   Staphylococcus aureus - MIC*    CIPROFLOXACIN <=0.5 SENSITIVE Sensitive     ERYTHROMYCIN <=0.25 SENSITIVE Sensitive     GENTAMICIN <=0.5 SENSITIVE Sensitive     OXACILLIN 0.5 SENSITIVE Sensitive     TETRACYCLINE <=1 SENSITIVE Sensitive     VANCOMYCIN <=0.5 SENSITIVE Sensitive     TRIMETH/SULFA <=10 SENSITIVE Sensitive     CLINDAMYCIN <=0.25 SENSITIVE Sensitive     RIFAMPIN <=0.5 SENSITIVE Sensitive     Inducible Clindamycin NEGATIVE Sensitive     * FEW STAPHYLOCOCCUS AUREUS  Culture, blood (Routine X 2) w Reflex to ID Panel     Status: Abnormal   Collection Time: 12/25/20  8:07 AM   Specimen: BLOOD LEFT HAND  Result Value Ref Range Status   Specimen Description BLOOD LEFT HAND  Final   Special Requests   Final    BOTTLES DRAWN AEROBIC ONLY Blood Culture results may not be optimal due to an inadequate volume of blood received in culture bottles   Culture  Setup Time   Final    GRAM POSITIVE COCCI IN CLUSTERS AEROBIC BOTTLE ONLY    Culture (A)  Final    STAPHYLOCOCCUS EPIDERMIDIS SUSCEPTIBILITIES PERFORMED ON PREVIOUS CULTURE WITHIN THE LAST 5 DAYS. Performed at [facility], [place] [address], [place], [place] [address]    Report Status 12/27/2020 FINAL  Final  Culture, blood (Routine X 2) w Reflex to ID  Panel     Status: Abnormal   Collection Time: 12/25/20  8:12 AM   Specimen: BLOOD RIGHT HAND  Result Value Ref Range Status   Specimen Description BLOOD RIGHT HAND  Final   Special Requests   Final    BOTTLES DRAWN AEROBIC ONLY Blood Culture results may not be optimal due to an inadequate volume of blood received in culture bottles   Culture  Setup Time   Final    GRAM POSITIVE COCCI IN CLUSTERS AEROBIC BOTTLE ONLY CRITICAL RESULT CALLED TO, READ BACK BY AND VERIFIED WITH: PHARMD E.WOLFE AT 8022 ON 12/25/2020 BY T.SAAD Performed at [facility], [place] [address], [place], [place] [address]    Culture STAPHYLOCOCCUS EPIDERMIDIS (A)  Final   Report Status 12/27/2020 FINAL  Final   Organism ID, Bacteria STAPHYLOCOCCUS EPIDERMIDIS  Final      Susceptibility   Staphylococcus epidermidis - MIC*    CIPROFLOXACIN >=8 RESISTANT Resistant     ERYTHROMYCIN >=8 RESISTANT Resistant     GENTAMICIN 8 INTERMEDIATE Intermediate     OXACILLIN >=4 RESISTANT Resistant     TETRACYCLINE 2 SENSITIVE Sensitive     VANCOMYCIN 1 SENSITIVE Sensitive     TRIMETH/SULFA 80 RESISTANT Resistant     CLINDAMYCIN >=8 RESISTANT Resistant     RIFAMPIN <=0.5 SENSITIVE Sensitive     Inducible Clindamycin NEGATIVE Sensitive     * STAPHYLOCOCCUS EPIDERMIDIS  Blood Culture ID Panel (Reflexed)     Status: Abnormal   Collection Time: 12/25/20  8:12 AM  Result Value Ref Range Status   Enterococcus faecalis NOT DETECTED NOT DETECTED  Final   Enterococcus Faecium NOT DETECTED NOT DETECTED Final   Listeria monocytogenes NOT DETECTED NOT DETECTED Final   Staphylococcus species DETECTED (A) NOT DETECTED Final    Comment: CRITICAL RESULT CALLED TO, READ BACK BY AND VERIFIED WITH: PHARMD E.WOLFE AT 1256 ON 12/25/2020 BY T.SAAD.    Staphylococcus aureus (BCID) NOT DETECTED NOT DETECTED Final   Staphylococcus epidermidis DETECTED (A) NOT DETECTED Final    Comment: Methicillin (oxacillin) resistant coagulase negative  staphylococcus. Possible blood culture contaminant (unless isolated from more than one blood culture draw or clinical case suggests pathogenicity). No antibiotic treatment is indicated for blood  culture contaminants.    Staphylococcus lugdunensis NOT DETECTED NOT DETECTED Final   Streptococcus species NOT DETECTED NOT DETECTED Final   Streptococcus agalactiae NOT DETECTED NOT DETECTED Final   Streptococcus pneumoniae NOT DETECTED NOT DETECTED Final   Streptococcus pyogenes NOT DETECTED NOT DETECTED Final   A.calcoaceticus-baumannii NOT DETECTED NOT DETECTED Final   Bacteroides fragilis NOT DETECTED NOT DETECTED Final   Enterobacterales NOT DETECTED NOT DETECTED Final   Enterobacter cloacae complex NOT DETECTED NOT DETECTED Final   Escherichia coli NOT DETECTED NOT DETECTED Final   Klebsiella aerogenes NOT DETECTED NOT DETECTED Final   Klebsiella oxytoca NOT DETECTED NOT DETECTED Final   Klebsiella pneumoniae NOT DETECTED NOT DETECTED Final   Proteus species NOT DETECTED NOT DETECTED Final   Salmonella species NOT DETECTED NOT DETECTED Final   Serratia marcescens NOT DETECTED NOT DETECTED Final   Haemophilus influenzae NOT DETECTED NOT DETECTED Final   Neisseria meningitidis NOT DETECTED NOT DETECTED Final   Pseudomonas aeruginosa NOT DETECTED NOT DETECTED Final   Stenotrophomonas maltophilia NOT DETECTED NOT DETECTED Final   Candida albicans NOT DETECTED NOT DETECTED Final   Candida auris NOT DETECTED NOT DETECTED Final   Candida glabrata NOT DETECTED NOT DETECTED Final   Candida krusei NOT DETECTED NOT DETECTED Final   Candida parapsilosis NOT DETECTED NOT DETECTED Final   Candida tropicalis NOT DETECTED NOT DETECTED Final   Cryptococcus neoformans/gattii NOT DETECTED NOT DETECTED Final   Methicillin resistance mecA/C DETECTED (A) NOT DETECTED Final    Comment: CRITICAL RESULT CALLED TO, READ BACK BY AND VERIFIED WITH: PHARMD E.WOLFE AT 1256 ON 12/25/2020 BY T.SAAD. Performed at  [facility], [place] [address] [place], [place] [address]   Culture, blood (routine x 2)     Status: None   Collection Time: 12/26/20  5:43 AM   Specimen: BLOOD  Result Value Ref Range Status   Specimen Description BLOOD RIGHT ANTECUBITAL  Final   Special Requests   Final    BOTTLES DRAWN AEROBIC AND ANAEROBIC Blood Culture adequate volume   Culture   Final    NO GROWTH 5 DAYS Performed at [facility], [place] [address] [place], [place] [address]    Report Status 12/31/2020 FINAL  Final  Culture, blood (routine x 2)     Status: None   Collection Time: 12/26/20  5:43 AM   Specimen: BLOOD RIGHT HAND  Result Value Ref Range Status   Specimen Description BLOOD RIGHT HAND  Final   Special Requests   Final    BOTTLES DRAWN AEROBIC ONLY Blood Culture results may not be optimal due to an inadequate volume of blood received in culture bottles   Culture   Final    NO GROWTH 5 DAYS Performed at [facility], [place] [address], [place], [place] [address]    Report Status 12/31/2020 FINAL  Final  Culture, Urine     Status: None   Collection Time: 01/02/21  4:06 AM   Specimen: Urine, Random  Result Value Ref Range Status   Specimen Description URINE, RANDOM  Final   Special Requests NONE  Final   Culture   Final    NO GROWTH Performed at [facility], [address] [place], [place] [address]    Report Status 01/03/2021 FINAL  Final  Culture, blood (routine x 2)     Status: Abnormal   Collection Time: 01/02/21  4:19 AM   Specimen: BLOOD LEFT HAND  Result Value Ref Range Status   Specimen Description BLOOD LEFT HAND  Final   Special Requests AEROBIC BOTTLE ONLY Blood Culture adequate volume  Final   Culture  Setup Time   Final    GRAM POSITIVE COCCI IN CLUSTERS AEROBIC BOTTLE ONLY CRITICAL VALUE NOTED.  VALUE IS CONSISTENT WITH PREVIOUSLY REPORTED AND CALLED VALUE.    Culture (A)  Final    STAPHYLOCOCCUS EPIDERMIDIS SUSCEPTIBILITIES PERFORMED ON PREVIOUS CULTURE  WITHIN THE LAST 5 DAYS. Performed at [facility], [place] [address], [place], [place] [address]    Report Status 01/05/2021 FINAL  Final  Culture, blood (routine x 2)     Status: Abnormal   Collection Time: 01/02/21  4:32 AM   Specimen: BLOOD LEFT HAND  Result Value Ref Range Status   Specimen Description BLOOD LEFT HAND  Final   Special Requests   Final    BOTTLES DRAWN AEROBIC AND ANAEROBIC Blood Culture results may not be optimal due to an inadequate volume of blood received in culture bottles   Culture  Setup Time   Final    GRAM POSITIVE COCCI ANAEROBIC BOTTLE ONLY CRITICAL RESULT CALLED TO, READ BACK BY AND VERIFIED WITH: PHARMD GREG ABBOTT 01/03/2021 AT 0630 A.HUGHES Performed at [facility], [place] [address], [place], [place] [address]    Culture STAPHYLOCOCCUS EPIDERMIDIS (A)  Final   Report Status 01/05/2021 FINAL  Final   Organism ID, Bacteria STAPHYLOCOCCUS EPIDERMIDIS  Final      Susceptibility   Staphylococcus epidermidis - MIC*    CIPROFLOXACIN >=8 RESISTANT Resistant     ERYTHROMYCIN >=8 RESISTANT Resistant     GENTAMICIN >=16 RESISTANT Resistant     OXACILLIN >=4 RESISTANT Resistant     TETRACYCLINE 2 SENSITIVE Sensitive     VANCOMYCIN 1 SENSITIVE Sensitive     TRIMETH/SULFA 80 RESISTANT Resistant     CLINDAMYCIN >=8 RESISTANT Resistant     RIFAMPIN <=0.5 SENSITIVE Sensitive     Inducible Clindamycin NEGATIVE Sensitive     * STAPHYLOCOCCUS EPIDERMIDIS  Blood Culture ID Panel (Reflexed)     Status: Abnormal   Collection Time: 01/02/21  4:32 AM  Result Value Ref Range Status   Enterococcus faecalis NOT DETECTED NOT DETECTED Final   Enterococcus Faecium NOT DETECTED NOT DETECTED Final   Listeria monocytogenes NOT DETECTED NOT DETECTED Final   Staphylococcus species DETECTED (A) NOT DETECTED Final    Comment: CRITICAL RESULT CALLED TO, READ BACK BY AND VERIFIED WITH: PHARMD GREG ABBOTT 01/03/2021 AT 0630 A.HUGHES    Staphylococcus aureus (BCID) NOT  DETECTED NOT DETECTED Final   Staphylococcus epidermidis DETECTED (A) NOT DETECTED Final    Comment: Methicillin (oxacillin) resistant coagulase negative staphylococcus. Possible blood culture contaminant (unless isolated from more than one blood culture draw or clinical case suggests pathogenicity). No antibiotic treatment is indicated for blood  culture contaminants. CRITICAL RESULT CALLED TO, READ  BACK BY AND VERIFIED WITH: PHARMD GREG ABBOTT 01/03/2021 AT 0630 A.HUGHES    Staphylococcus lugdunensis NOT DETECTED NOT DETECTED Final   Streptococcus species NOT DETECTED NOT DETECTED Final   Streptococcus agalactiae NOT DETECTED NOT DETECTED Final   Streptococcus pneumoniae NOT DETECTED NOT DETECTED Final   Streptococcus pyogenes NOT DETECTED NOT DETECTED Final   A.calcoaceticus-baumannii NOT DETECTED NOT DETECTED Final   Bacteroides fragilis NOT DETECTED NOT DETECTED Final   Enterobacterales NOT DETECTED NOT DETECTED Final   Enterobacter cloacae complex NOT DETECTED NOT DETECTED Final   Escherichia coli NOT DETECTED NOT DETECTED Final   Klebsiella aerogenes NOT DETECTED NOT DETECTED Final   Klebsiella oxytoca NOT DETECTED NOT DETECTED Final   Klebsiella pneumoniae NOT DETECTED NOT DETECTED Final   Proteus species NOT DETECTED NOT DETECTED Final   Salmonella species NOT DETECTED NOT DETECTED Final   Serratia marcescens NOT DETECTED NOT DETECTED Final   Haemophilus influenzae NOT DETECTED NOT DETECTED Final   Neisseria meningitidis NOT DETECTED NOT DETECTED Final   Pseudomonas aeruginosa NOT DETECTED NOT DETECTED Final   Stenotrophomonas maltophilia NOT DETECTED NOT DETECTED Final   Candida albicans NOT DETECTED NOT DETECTED Final   Candida auris NOT DETECTED NOT DETECTED Final   Candida glabrata NOT DETECTED NOT DETECTED Final   Candida krusei NOT DETECTED NOT DETECTED Final   Candida parapsilosis NOT DETECTED NOT DETECTED Final   Candida tropicalis NOT DETECTED NOT DETECTED Final    Cryptococcus neoformans/gattii NOT DETECTED NOT DETECTED Final   Methicillin resistance mecA/C DETECTED (A) NOT DETECTED Final    Comment: CRITICAL RESULT CALLED TO, READ BACK BY AND VERIFIED WITH: PHARMD GREG ABBOTT 01/03/2021 AT 0630 A.HUGHES Performed at [facility], [place] [address] [place], [place] [address]   Culture, blood (Routine X 2) w Reflex to ID Panel     Status: Abnormal   Collection Time: 01/03/21 11:31 AM   Specimen: BLOOD LEFT HAND  Result Value Ref Range Status   Specimen Description BLOOD LEFT HAND  Final   Special Requests   Final    BOTTLES DRAWN AEROBIC ONLY Blood Culture adequate volume   Culture  Setup Time   Final    GRAM NEGATIVE RODS AEROBIC BOTTLE ONLY PHARMD GREG ABBOTT 01/04/2021 AT 0425 A.HUGHES CRITICAL VALUE NOTED.  VALUE IS CONSISTENT WITH PREVIOUSLY REPORTED AND CALLED VALUE.    Culture (A)  Final    ESCHERICHIA COLI SUSCEPTIBILITIES PERFORMED ON PREVIOUS CULTURE WITHIN THE LAST 5 DAYS. Performed at [facility], [place] [address], [place], [place] [address]    Report Status 01/06/2021 FINAL  Final  Culture, blood (Routine X 2) w Reflex to ID Panel     Status: Abnormal   Collection Time: 01/03/21 11:31 AM   Specimen: BLOOD LEFT FOREARM  Result Value Ref Range Status   Specimen Description BLOOD LEFT FOREARM  Final   Special Requests   Final    BOTTLES DRAWN AEROBIC ONLY Blood Culture adequate volume   Culture  Setup Time   Final    GRAM NEGATIVE RODS AEROBIC BOTTLE ONLY CRITICAL RESULT CALLED TO, READ BACK BY AND VERIFIED WITH: PHARMD GREG ABBOTT 01/04/2021 AT 0425 A.HUGHES Performed at [facility], [place] [address] [place], [place] [address]    Culture ESCHERICHIA COLI (A)  Final   Report Status 01/06/2021 FINAL  Final   Organism ID, Bacteria ESCHERICHIA COLI  Final      Susceptibility   Escherichia coli - MIC*    AMPICILLIN 8  SENSITIVE Sensitive     CEFAZOLIN <=4 SENSITIVE Sensitive     CEFEPIME <=0.12 SENSITIVE  Sensitive     CEFTAZIDIME <=1 SENSITIVE Sensitive     CEFTRIAXONE <=0.25 SENSITIVE Sensitive     CIPROFLOXACIN <=0.25 SENSITIVE Sensitive     GENTAMICIN <=1 SENSITIVE Sensitive     IMIPENEM <=0.25 SENSITIVE Sensitive     TRIMETH/SULFA <=20 SENSITIVE Sensitive     AMPICILLIN/SULBACTAM <=2 SENSITIVE Sensitive     PIP/TAZO <=4 SENSITIVE Sensitive     * ESCHERICHIA COLI  Blood Culture ID Panel (Reflexed)     Status: Abnormal   Collection Time: 01/03/21 11:31 AM  Result Value Ref Range Status   Enterococcus faecalis NOT DETECTED NOT DETECTED Final   Enterococcus Faecium NOT DETECTED NOT DETECTED Final   Listeria monocytogenes NOT DETECTED NOT DETECTED Final   Staphylococcus species NOT DETECTED NOT DETECTED Final   Staphylococcus aureus (BCID) NOT DETECTED NOT DETECTED Final   Staphylococcus epidermidis NOT DETECTED NOT DETECTED Final   Staphylococcus lugdunensis NOT DETECTED NOT DETECTED Final   Streptococcus species NOT DETECTED NOT DETECTED Final   Streptococcus agalactiae NOT DETECTED NOT DETECTED Final   Streptococcus pneumoniae NOT DETECTED NOT DETECTED Final   Streptococcus pyogenes NOT DETECTED NOT DETECTED Final   A.calcoaceticus-baumannii NOT DETECTED NOT DETECTED Final   Bacteroides fragilis NOT DETECTED NOT DETECTED Final   Enterobacterales DETECTED (A) NOT DETECTED Final    Comment: Enterobacterales represent a large order of gram negative bacteria, not a single organism. CRITICAL RESULT CALLED TO, READ BACK BY AND VERIFIED WITH: PHARMD GREG ABBOTT 01/04/2021 AT 0425 A.HUGHES    Enterobacter cloacae complex NOT DETECTED NOT DETECTED Final   Escherichia coli DETECTED (A) NOT DETECTED Final    Comment: CRITICAL RESULT CALLED TO, READ BACK BY AND VERIFIED WITH: PHARMD GREG ABBOTT 01/04/2021 AT 0425 A.HUGHES    Klebsiella aerogenes NOT DETECTED NOT DETECTED Final   Klebsiella oxytoca NOT DETECTED NOT DETECTED Final   Klebsiella pneumoniae NOT DETECTED NOT DETECTED Final    Proteus species NOT DETECTED NOT DETECTED Final   Salmonella species NOT DETECTED NOT DETECTED Final   Serratia marcescens NOT DETECTED NOT DETECTED Final   Haemophilus influenzae NOT DETECTED NOT DETECTED Final   Neisseria meningitidis NOT DETECTED NOT DETECTED Final   Pseudomonas aeruginosa NOT DETECTED NOT DETECTED Final   Stenotrophomonas maltophilia NOT DETECTED NOT DETECTED Final   Candida albicans NOT DETECTED NOT DETECTED Final   Candida auris NOT DETECTED NOT DETECTED Final   Candida glabrata NOT DETECTED NOT DETECTED Final   Candida krusei NOT DETECTED NOT DETECTED Final   Candida parapsilosis NOT DETECTED NOT DETECTED Final   Candida tropicalis NOT DETECTED NOT DETECTED Final   Cryptococcus neoformans/gattii NOT DETECTED NOT DETECTED Final   CTX-M ESBL NOT DETECTED NOT DETECTED Final   Carbapenem resistance IMP NOT DETECTED NOT DETECTED Final   Carbapenem resistance KPC NOT DETECTED NOT DETECTED Final   Carbapenem resistance NDM NOT DETECTED NOT DETECTED Final   Carbapenem resist OXA 48 LIKE NOT DETECTED NOT DETECTED Final   Carbapenem resistance VIM NOT DETECTED NOT DETECTED Final    Comment: Performed at [facility], [address], [place], [place] [address]  Culture, blood (routine x 2)     Status: None (Preliminary result)   Collection Time: 01/05/21  6:10 AM   Specimen: BLOOD RIGHT HAND  Result Value Ref Range Status   Specimen Description BLOOD RIGHT HAND  Final   Special Requests   Final  BOTTLES DRAWN AEROBIC ONLY Blood Culture adequate volume   Culture   Final    NO GROWTH 2 DAYS Performed at [facility], [place] [address] [place], [place] [address]    Report Status PENDING  Incomplete  Culture, blood (routine x 2)     Status: None (Preliminary result)   Collection Time: 01/05/21  6:11 AM   Specimen: BLOOD RIGHT HAND  Result Value Ref Range Status   Specimen Description BLOOD RIGHT HAND  Final   Special Requests   Final    BOTTLES DRAWN  AEROBIC ONLY Blood Culture adequate volume   Culture   Final    NO GROWTH 2 DAYS Performed at [facility], [place] [address], [place], [place] [address]    Report Status PENDING  Incomplete  Culture, Urine     Status: None   Collection Time: 01/05/21  7:38 AM   Specimen: Urine, Random  Result Value Ref Range Status   Specimen Description URINE, RANDOM  Final   Special Requests NONE  Final   Culture   Final    NO GROWTH Performed at [facility], [place] [address], [place], [place] [address]    Report Status 01/06/2021 FINAL  Final    Pertinent Lab. CBC Latest Ref Rng & Units 01/07/2021 01/06/2021 01/05/2021  WBC 4.0 - 10.5 K/uL 9.7 13.9(H) 11.7(H)  Hemoglobin 13.0 - 17.0 g/dL 9.0(L) 9.6(L) 9.7(L)  Hematocrit 39.0 - 52.0 % 29.0(L) 31.2(L) 30.5(L)  Platelets 150 - 400 K/uL 188 201 186   CMP Latest Ref Rng & Units 01/07/2021 01/06/2021 01/05/2021  Glucose 70 - 99 mg/dL 131(H) 182(H) 136(H)  BUN 6 - 20 mg/dL 54(H) 52(H) 56(H)  Creatinine 0.61 - 1.24 mg/dL 0.94 1.00 1.03  Sodium 135 - 145 mmol/L 146(H) 146(H) 146(H)  Potassium 3.5 - 5.1 mmol/L 3.5 3.8 3.7  Chloride 98 - 111 mmol/L 108 112(H) 111  CO2 22 - 32 mmol/L _0 Calcium 8.9 - 10.3 mg/dL 8.8(L) 8.8(L) 8.7(L)  Total Protein 6.5 - 8.1 g/dL - - -  Total Bilirubin 0.3 - 1.2 mg/dL - - -  Alkaline Phos 38 - 126 U/L - - -  AST 15 - 41 U/L - - -  ALT 0 - 44 U/L - - -     Pertinent Imaging today Plain films and CT images have been personally visualized and interpreted; radiology reports have been reviewed. Decision making incorporated into the Impression / Recommendations.  I have spent approx 30 minutes for this patient encounter including review of prior medical records, coordination of care  with greater than 50% of time being face to face/counseling and discussing diagnostics/treatment plan with the patient/family.  Electronically signed by:   Rosiland Oz, MD Infectious Disease Physician [facility] Pager: [phone]

## 2021-01-07 NOTE — Progress Notes (Addendum)
Physical Therapy Treatment Patient Details Name: [name] MRN: [id number] DOB: [birth year] Today's Date: 01/07/2021    History of Present Illness 60 year old [name] male who presented to the emergency room from home 12/10/20.  Pt had 4-5 days of N/V/D. Pt became more confused and at times belligerent. Admitted for hypoxemic respiratory failure and septic shock 2/2 E. Coli bacteremia and UTI and was intubated. Extubated 4/16, now on cortrak    PT Comments    Pt admitted with above diagnosis. Pt was able to sit EOB with min guard assist and transfer to the drop arm chair with mod to max assist of 2 persons.  Pt did well assisting with transfer today and was happy to be sitting up. Placed cushion under pt. Nurse aware that pt can be Maxi moved back to bed and to get pt back to bed after 2 hours max.  Pt tolerated well overall.   Pt currently with functional limitations due to balance and endurance deficits. Pt will benefit from skilled PT to increase their independence and safety with mobility to allow discharge to the venue listed below.     Follow Up Recommendations  LTACH     Equipment Recommendations  None recommended by PT    Recommendations for Other Services       Precautions / Restrictions Precautions Precautions: Fall;Other (comment) Precaution Comments: cortrak, condom catheter Restrictions Weight Bearing Restrictions: No    Mobility  Bed Mobility Overal bed mobility: Needs Assistance Bed Mobility: Supine to Sit;Rolling Rolling: Max assist   Supine to sit: +2 for physical assistance;HOB elevated;Max assist     General bed mobility comments: Max assist to elevate trunk to sit EOB.  Incr time for pt to get his balance once at EOB.    Transfers Overall transfer level: Needs assistance   Transfers: Lateral/Scoot Transfers          Lateral/Scoot Transfers: Mod assist;Max assist;+2 physical assistance;From elevated surface General transfer comment: Pt scooted with  pad to drop arm recliner with cushioned seat with pt assisting by lifting buttocks with use of UES.  Pt needed mod to max assist but did help with the transfer. Nurse encouraged to use Maxi move to get pt back to bed.  Ambulation/Gait             General Gait Details: unable   Stairs             Wheelchair Mobility    Modified Rankin (Stroke Patients Only)       Balance Overall balance assessment: Needs assistance Sitting-balance support: Bilateral upper extremity supported;Feet supported Sitting balance-Leahy Scale: Fair Sitting balance - Comments: able to sit EOB with min guard assist. Pt tried to lie back down once neeidng mod assist.                                    Cognition Arousal/Alertness: Awake/alert Behavior During Therapy: Restless Overall Cognitive Status: Impaired/Different from baseline                                 General Comments: talking today.  Wanted to get OOB. Some confusion at times per nurse      Exercises General Exercises - Lower Extremity Ankle Circles/Pumps: AROM;Both;10 reps;Seated Long Arc Quad: AROM;Both;10 reps;Seated    General Comments        Pertinent Vitals/Pain Pain Assessment:  Faces Faces Pain Scale: Hurts even more Pain Location: rt buttock Pain Descriptors / Indicators: Restless;Grimacing Pain Intervention(s): Limited activity within patient's tolerance;Monitored during session;Repositioned    Home Living                      Prior Function            PT Goals (current goals can now be found in the care plan section) Progress towards PT goals: Progressing toward goals    Frequency    Min 2X/week      PT Plan Current plan remains appropriate    Co-evaluation              AM-PAC PT "6 Clicks" Mobility   Outcome Measure  Help needed turning from your back to your side while in a flat bed without using bedrails?: A Little Help needed moving from  lying on your back to sitting on the side of a flat bed without using bedrails?: A Lot Help needed moving to and from a bed to a chair (including a wheelchair)?: A Lot Help needed standing up from a chair using your arms (e.g., wheelchair or bedside chair)?: Total Help needed to walk in hospital room?: Total Help needed climbing 3-5 steps with a railing? : Total 6 Click Score: 10    End of Session Equipment Utilized During Treatment: Gait belt Activity Tolerance: Patient limited by fatigue Patient left: in chair;with call bell/phone within reach;with chair alarm set Nurse Communication: Mobility status;Need for lift equipment PT Visit Diagnosis: Muscle weakness (generalized) (M62.81);Difficulty in walking, not elsewhere classified (R26.2);Other abnormalities of gait and mobility (R26.89)     Time: 9485-4627 PT Time Calculation (min) (ACUTE ONLY): 24 min  Charges:  $Therapeutic Exercise: 8-22 mins $Therapeutic Activity: 8-22 mins                     Kysean Sweet M,PT Acute Rehab Services [phone] (pager)   [name] 01/07/2021, 2:01 PM

## 2021-01-07 NOTE — Plan of Care (Signed)
  Problem: Education: Goal: Knowledge of General Education information will improve Description: Including pain rating scale, medication(s)/side effects and non-pharmacologic comfort measures Outcome: Progressing   Problem: Health Behavior/Discharge Planning: Goal: Ability to manage health-related needs will improve Outcome: Progressing   Problem: Clinical Measurements: Goal: Ability to maintain clinical measurements within normal limits will improve Outcome: Progressing Goal: Will remain free from infection Outcome: Progressing Goal: Diagnostic test results will improve Outcome: Progressing Goal: Respiratory complications will improve Outcome: Progressing Goal: Cardiovascular complication will be avoided Outcome: Progressing   Problem: Activity: Goal: Risk for activity intolerance will decrease Outcome: Progressing   Problem: Nutrition: Goal: Adequate nutrition will be maintained Outcome: Progressing   Problem: Coping: Goal: Level of anxiety will decrease Outcome: Progressing   Problem: Elimination: Goal: Will not experience complications related to bowel motility Outcome: Progressing Goal: Will not experience complications related to urinary retention Outcome: Progressing   Problem: Pain Managment: Goal: General experience of comfort will improve Outcome: Progressing   Problem: Safety: Goal: Ability to remain free from injury will improve Outcome: Progressing   Problem: Skin Integrity: Goal: Risk for impaired skin integrity will decrease Outcome: Progressing   Problem: Safety: Goal: Non-violent Restraint(s) Outcome: Progressing   Problem: Education: Goal: Knowledge of disease or condition will improve Outcome: Progressing Goal: Knowledge of secondary prevention will improve Outcome: Progressing Goal: Knowledge of patient specific risk factors addressed and post discharge goals established will improve Outcome: Progressing   Problem: Self-Care: Goal:  Verbalization of feelings and concerns over difficulty with self-care will improve Outcome: Progressing   Problem: Nutrition: Goal: Dietary intake will improve Outcome: Progressing   

## 2021-01-07 NOTE — Plan of Care (Signed)
Pt continues to have soft wrist retraints d/t pulling at tubing. Will continue monitor for decreased safety awareness.

## 2021-01-07 NOTE — Progress Notes (Signed)
Pt is more alert and asking for food by mouth. Writer explained to pt that there is a feeding tube in place until md can allow him to eat. Pt did signal that he understood. Will continue to monitor and tx as indicated.

## 2021-01-07 NOTE — Consult Note (Signed)
Consult Note  [name] 18-Nov-1960  [id number].    Requesting MD: Dr. Dayna Barker Chief Complaint/Reason for Consult: right buttocks ulcer  HPI:  60 yo patient who presented to the ED on 3/31 with chief complaint of severe diarrhea.  While in the emergency department he became hypotensive, combative, confused and was intubated.  He was admitted to the critical care service for further work-up and treatment.  Evaluation was significant for septic shock due to E. coli UTI with bacteremia, ATN, hypoxemic respiratory failure, stress cardiomyopathy.  During hospitalization he has been seen by urology for traumatic Foley removal and subsequent gross hematuria as well as left ureteral stent placement for left ureteral stone.  Infectious disease also on consult.  Patient was extubated and weaned off of vasopressors and he was transferred to the [facility] service on 01/01/2021.   During hospitalization patient has developed a right buttocks ulcer and we were consulted for possible surgical management.  Physical therapy and wound care has been following and patient has been managed with hydrotherapy.  This disease has also evaluated and added Flagyl for further treatment.  This morning patient is alert but confused. He is conversational and follows commands but without insight and unable to provide history. He states his buttocks pain has been present "for years" and is unable to say when the wound begin. Per chart review and discussion with bedside nursing he was ambulatory and able to perform self care prior to admission.   ROS: Review of Systems  Unable to perform ROS: Mental acuity    No family history on file.  No past medical history on file.  Past Surgical History:  Procedure Laterality Date  . CYSTOSCOPY W/ URETERAL STENT PLACEMENT Left 12/16/2020   Procedure: CYSTOSCOPY WITH RETROGRADE PYELOGRAM/URETERAL STENT PLACEMENT;  Surgeon: Alfredo Martinez, MD;  Location: [facility] OR;  Service:  Urology;  Laterality: Left;    Social History:  has no history on file for tobacco use, alcohol use, and drug use.  Allergies:  Allergies  Allergen Reactions  . Ibuprofen Other (See Comments)    Blood in the stool.  . Metformin Other (See Comments)    Tremors, muscles  locking up, unable to control extremities    Medications Prior to Admission  Medication Sig Dispense Refill  . atorvastatin (LIPITOR) 40 MG tablet Take 40 mg by mouth daily.    Marland Kitchen glipiZIDE (GLUCOTROL) 10 MG tablet Take 10 mg by mouth every morning.    Marland Kitchen oxyCODONE (OXYCONTIN) 10 mg 12 hr tablet Take 10 mg by mouth in the morning and at bedtime.    Marland Kitchen oxyCODONE-acetaminophen (PERCOCET) 10-325 MG tablet Take 1 tablet by mouth in the morning, at noon, in the evening, and at bedtime.    Marland Kitchen oxymetazoline (AFRIN) 0.05 % nasal spray Place 1 spray into both nostrils 2 (two) times daily as needed for congestion.    Marland Kitchen rOPINIRole (REQUIP) 1 MG tablet Take 1 mg by mouth in the morning and at bedtime.      Blood pressure 135/79, pulse 97, temperature 100.1 F (37.8 C), temperature source Axillary, resp. rate (!) 26, height 6\' 2"  (1.88 m), weight 120.6 kg, SpO2 92 %. Physical Exam:  General: pleasant chronically ill appearing male who is laying in bed in NAD HEENT: head is normocephalic, atraumatic.  Sclera are noninjected.  Pupils equal and round.  Ears and nose without any masses or lesions.  Mouth is pink and moist Heart: regular, rate, and rhythm. Palpable radial and pedal  pulses bilaterally Lungs: no audible wheezes, rhonchi, or rales noted.  Respiratory effort nonlabored Abd: soft, NT, ND, no masses, hernias, or organomegaly MS: all 4 extremities are symmetrical with no cyanosis, clubbing, or edema. Skin: warm and dry. Buttocks wound with foul odor, less than 20% granulation tissue, large eschar and fibrinous - see pic below Neuro: Cranial nerves 2-12 grossly intact, sensation is normal throughout Psych: A&Ox2, follows  commands.        Results for orders placed or performed during the hospital encounter of 12/10/20 (from the past 48 hour(s))  Glucose, capillary     Status: Abnormal   Collection Time: 01/05/21 11:24 AM  Result Value Ref Range   Glucose-Capillary 121 (H) 70 - 99 mg/dL    Comment: Glucose reference range applies only to samples taken after fasting for at least 8 hours.  Glucose, capillary     Status: Abnormal   Collection Time: 01/05/21  3:40 PM  Result Value Ref Range   Glucose-Capillary 179 (H) 70 - 99 mg/dL    Comment: Glucose reference range applies only to samples taken after fasting for at least 8 hours.  Glucose, capillary     Status: Abnormal   Collection Time: 01/05/21  7:58 PM  Result Value Ref Range   Glucose-Capillary 136 (H) 70 - 99 mg/dL    Comment: Glucose reference range applies only to samples taken after fasting for at least 8 hours.  Glucose, capillary     Status: Abnormal   Collection Time: 01/05/21 11:25 PM  Result Value Ref Range   Glucose-Capillary 149 (H) 70 - 99 mg/dL    Comment: Glucose reference range applies only to samples taken after fasting for at least 8 hours.  Glucose, capillary     Status: Abnormal   Collection Time: 01/06/21  3:27 AM  Result Value Ref Range   Glucose-Capillary 168 (H) 70 - 99 mg/dL    Comment: Glucose reference range applies only to samples taken after fasting for at least 8 hours.  CK     Status: None   Collection Time: 01/06/21  4:47 AM  Result Value Ref Range   Total CK 129 49 - 397 U/L    Comment: Performed at [facility], [address] [place], [place] [id number]  Basic metabolic panel     Status: Abnormal   Collection Time: 01/06/21  4:47 AM  Result Value Ref Range   Sodium 146 (H) 135 - 145 mmol/L   Potassium 3.8 3.5 - 5.1 mmol/L   Chloride 112 (H) 98 - 111 mmol/L   CO2 25 22 - 32 mmol/L   Glucose, Bld 182 (H) 70 - 99 mg/dL    Comment: Glucose reference range applies only to samples taken after fasting  for at least 8 hours.   BUN 52 (H) 6 - 20 mg/dL   Creatinine, Ser 6.571.00 0.61 - 1.24 mg/dL   Calcium 8.8 (L) 8.9 - 10.3 mg/dL   GFR, Estimated >84>60 >69>60 mL/min    Comment: (NOTE) Calculated using the CKD-EPI Creatinine Equation (2021)    Anion gap 9 5 - 15    Comment: Performed at [facility], [address] [place], [place] [address]  CBC     Status: Abnormal   Collection Time: 01/06/21  4:47 AM  Result Value Ref Range   WBC 13.9 (H) 4.0 - 10.5 K/uL   RBC 3.08 (L) 4.22 - 5.81 MIL/uL   Hemoglobin 9.6 (L) 13.0 - 17.0 g/dL  HCT 31.2 (L) 39.0 - 52.0 %   MCV 101.3 (H) 80.0 - 100.0 fL   MCH 31.2 26.0 - 34.0 pg   MCHC 30.8 30.0 - 36.0 g/dL   RDW 46.5 68.1 - 27.5 %   Platelets 201 150 - 400 K/uL   nRBC 0.0 0.0 - 0.2 %    Comment: Performed at [facility], [address] [place], [place] [address]  Glucose, capillary     Status: Abnormal   Collection Time: 01/06/21  7:49 AM  Result Value Ref Range   Glucose-Capillary 133 (H) 70 - 99 mg/dL    Comment: Glucose reference range applies only to samples taken after fasting for at least 8 hours.  Glucose, capillary     Status: Abnormal   Collection Time: 01/06/21 12:30 PM  Result Value Ref Range   Glucose-Capillary 194 (H) 70 - 99 mg/dL    Comment: Glucose reference range applies only to samples taken after fasting for at least 8 hours.  Glucose, capillary     Status: Abnormal   Collection Time: 01/06/21  6:23 PM  Result Value Ref Range   Glucose-Capillary 133 (H) 70 - 99 mg/dL    Comment: Glucose reference range applies only to samples taken after fasting for at least 8 hours.  Glucose, capillary     Status: Abnormal   Collection Time: 01/06/21  8:08 PM  Result Value Ref Range   Glucose-Capillary 126 (H) 70 - 99 mg/dL    Comment: Glucose reference range applies only to samples taken after fasting for at least 8 hours.  Glucose, capillary     Status: Abnormal   Collection Time: 01/06/21 11:15 PM  Result Value Ref Range    Glucose-Capillary 159 (H) 70 - 99 mg/dL    Comment: Glucose reference range applies only to samples taken after fasting for at least 8 hours.  Basic metabolic panel     Status: Abnormal   Collection Time: 01/07/21  3:01 AM  Result Value Ref Range   Sodium 146 (H) 135 - 145 mmol/L   Potassium 3.5 3.5 - 5.1 mmol/L   Chloride 108 98 - 111 mmol/L   CO2 30 22 - 32 mmol/L   Glucose, Bld 131 (H) 70 - 99 mg/dL    Comment: Glucose reference range applies only to samples taken after fasting for at least 8 hours.   BUN 54 (H) 6 - 20 mg/dL   Creatinine, Ser 7.49 0.61 - 1.24 mg/dL   Calcium 8.8 (L) 8.9 - 10.3 mg/dL   GFR, Estimated >44 >96 mL/min    Comment: (NOTE) Calculated using the CKD-EPI Creatinine Equation (2021)    Anion gap 8 5 - 15    Comment: Performed at [facility], [address] [place], [place] [address]  CBC     Status: Abnormal   Collection Time: 01/07/21  3:01 AM  Result Value Ref Range   WBC 9.7 4.0 - 10.5 K/uL   RBC 2.88 (L) 4.22 - 5.81 MIL/uL   Hemoglobin 9.0 (L) 13.0 - 17.0 g/dL   HCT 38.4 (L) 66.5 - 99.3 %   MCV 100.7 (H) 80.0 - 100.0 fL   MCH 31.3 26.0 - 34.0 pg   MCHC 31.0 30.0 - 36.0 g/dL   RDW 57.0 17.7 - 93.9 %   Platelets 188 150 - 400 K/uL   nRBC 0.0 0.0 - 0.2 %    Comment: Performed at [facility], [address] [place], [place]  [address]  Glucose, capillary     Status: Abnormal   Collection Time: 01/07/21  3:27 AM  Result Value Ref Range   Glucose-Capillary 123 (H) 70 - 99 mg/dL    Comment: Glucose reference range applies only to samples taken after fasting for at least 8 hours.  Glucose, capillary     Status: Abnormal   Collection Time: 01/07/21  8:17 AM  Result Value Ref Range   Glucose-Capillary 148 (H) 70 - 99 mg/dL    Comment: Glucose reference range applies only to samples taken after fasting for at least 8 hours.   DG Chest Port 1 View  Result Date: 01/07/2021 CLINICAL DATA:  Shortness of breath and altered mental status.  EXAM: PORTABLE CHEST 1 VIEW COMPARISON:  January 03, 2021. FINDINGS: Low lung volumes. Similar patchy opacities in the right midlung and streaky bibasilar opacities. Suspected small right pleural effusion given fluid tracking along the fissure. No visible pneumothorax on this single AP semi erect radiograph. Similar cardiac silhouette. Gastric tube courses below the diaphragm with the tip outside the field of view. IMPRESSION: 1. No substantial change in patchy airspace opacities in the right midlung and streaky opacities at the bases, which could represent atelectasis, aspiration, and/or pneumonia. 2. Suspected small right pleural effusion given small volume of fluid tracking along the right fissure. Dedicated PA and lateral radiographs could better characterize if clinically indicated. Electronically Signed   By: Feliberto Harts MD   On: 01/07/2021 08:56      Assessment/Plan Right buttock sacral ulcer - continue hydrotherapy daily per PT - no acute surgical needs at this time - Antibiotics per ID  General surgery will sign off. Please do not hesitate to contact us with any questions or concerns   Eric Form, [facility] 01/07/2021, 11:14 AM Please see Amion for pager number during day hours 7:00am-4:30pm

## 2021-01-08 LAB — CBC
HCT: 26.9 % — ABNORMAL LOW (ref 39.0–52.0)
Hemoglobin: 8.5 g/dL — ABNORMAL LOW (ref 13.0–17.0)
MCH: 31.4 pg (ref 26.0–34.0)
MCHC: 31.6 g/dL (ref 30.0–36.0)
MCV: 99.3 fL (ref 80.0–100.0)
Platelets: 193 10*3/uL (ref 150–400)
RBC: 2.71 MIL/uL — ABNORMAL LOW (ref 4.22–5.81)
RDW: 14.4 % (ref 11.5–15.5)
WBC: 9.4 10*3/uL (ref 4.0–10.5)
nRBC: 0 % (ref 0.0–0.2)

## 2021-01-08 LAB — GLUCOSE, CAPILLARY
Glucose-Capillary: 139 mg/dL — ABNORMAL HIGH (ref 70–99)
Glucose-Capillary: 127 mg/dL — ABNORMAL HIGH (ref 70–99)
Glucose-Capillary: 201 mg/dL — ABNORMAL HIGH (ref 70–99)
Glucose-Capillary: 122 mg/dL — ABNORMAL HIGH (ref 70–99)
Glucose-Capillary: 58 mg/dL — ABNORMAL LOW (ref 70–99)
Glucose-Capillary: 177 mg/dL — ABNORMAL HIGH (ref 70–99)

## 2021-01-08 LAB — BASIC METABOLIC PANEL
Anion gap: 7 (ref 5–15)
BUN: 53 mg/dL — ABNORMAL HIGH (ref 6–20)
CO2: 26 mmol/L (ref 22–32)
Calcium: 8.6 mg/dL — ABNORMAL LOW (ref 8.9–10.3)
Chloride: 111 mmol/L (ref 98–111)
Creatinine, Ser: 0.88 mg/dL (ref 0.61–1.24)
GFR, Estimated: >60 mL/min (ref >60)
Glucose, Bld: 170 mg/dL — ABNORMAL HIGH (ref 70–99)
Potassium: 3.3 mmol/L — ABNORMAL LOW (ref 3.5–5.1)
Sodium: 144 mmol/L (ref 135–145)

## 2021-01-08 MED ORDER — POTASSIUM CHLORIDE 10 MEQ/100ML IV SOLN
10.0000 meq | INTRAVENOUS | Status: AC
  Administered 2021-01-08 (×2): 10 meq via INTRAVENOUS
  Filled 2021-01-08: qty 100

## 2021-01-08 MED ORDER — POTASSIUM CHLORIDE 20 MEQ PO PACK
40.0000 meq | PACK | Freq: Once | ORAL | Status: AC
  Administered 2021-01-08: 40 meq
  Filled 2021-01-08: qty 2

## 2021-01-08 MED ORDER — POTASSIUM CHLORIDE CRYS ER 20 MEQ PO TBCR
40.0000 meq | EXTENDED_RELEASE_TABLET | Freq: Once | ORAL | Status: DC
  Filled 2021-01-08: qty 2

## 2021-01-08 NOTE — Progress Notes (Signed)
PROGRESS NOTE    [name]  ZOX:[id number] DOB: 02/08/1961 DOA: 12/10/2020 PCP: Pcp, No   Chief Complaint  Patient presents with  . Altered Mental Status  Brief Narrative: 60 YO male w/ hypertension, hyperlipidemia presented to the ED 3/31 from home with for 5 days history of diarrhea nausea vomiting and confusion.  Patient was admitted for acute hypoxic aspiratory failure with septic shock due to E. coli bacteremia and UTI and ATN needing intubation pressor support sedation with Precedex/Versed. Patient had prolonged hospitalization complicated course (see previous ICU/transfer note) was managed by critical care, infectious disease for E. coli pyelonephritis complicated by hydronephrosis requiring placement of a stent and bacteremia then MSSA pneumonia,   MRSE endovascular infection, acute hypoxic/hypercapnic respiratory failure/acute toxic metabolic encephalopathy, AKI, left IJ/subclavian DVT versus septic thrombus-managed with Lovenox and ongoing fever/tachycardia. Patient transferred to [facility] on 01/01/2021-with ongoing confusion/altered mental status and mentation slowly improving, ongoing tachycardia, fevers episodes Cortrack NG feeding, PICC line.  Intermittently febrile, tachycardic tachypneic due to ongoing bacteremia. Vanco trough and peak level subtherapeutic 4/24.due to persistent bacteremia PICC line has been removed -4/24, repeat blood culture sent 4/24- and ceftriaxone added. Duplex UE/LE ordered again 4/25-no  thrombosis in bilateral upper extremities and DVT in the left IJ and subclavian vein appears to have resolved compared to previous study, no DVT in lower extremities, evidence of acute superficial vein thrombosis from varicose vein of the medial right calf. Underwent repeat CT abdomen pelvis and CT head no acute finding.  Antibiotics changed to daptomycin due to subtherapeutic Vanco level 4/26. Patient having improving mental status and less recurrence of fever since Seen by  general surgery for sacral wound 4/28- no indication for debridement.  Subjective: Seen and examined this morning No further temperature spike T-max 100.1 9 am yesterday- last spike was 101.4 4/27 noon On 2 L nasal cannula, no further tachypnea or tachycardia. Leukocytosis improved 9.4 Right wrist on soft restraint restraint, LUE is weak. He is much more alert awake conversant oriented to self place current president and current year  Assessment & Plan:  MRSE endovascular infection/bacteremia: TTE no acute finding,repeat CT abdomen pelvis 4/26 unremarkable.  Last blood culture on 4/22 still MRSe positive, Vanco LEVEL was subtherapeutic 4/24.PICC removed 4/24. Duplex UE/LE 4/25-previous DVT in the left IJ and subclavian vein resolved,acute superficial vein thrombosis from varicose vein of the medial right calf. Vancomycin switched to daptomycin 4/26  Flagyl added 4/27-> cont for 10 days.  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  Also Ancef on board  For E. coli bacteremia. ID following closely-palliative care was consulted, if congruent with goals of care may need TEE??- but management 95 and ID planning for 6 weeks of daptomycin starting 4/24 for septic thrombophlebitis.TTE 4.25-w/ nl lvef 60-65%, RV systolic function normal,pulmonary artery pressure mitral valve normal, aortic valve-normal,no regional WMA.  Recurrent E. coli bacteremia/E. coli pyelonephritis, repeat blood culture positive with E. coli bacteremia 4/24, repeat blood culture 4/25 NGTD.Previously on ceftriaxone 3/31-4/3, 4/7-4/11, 4/14-4/15. Ceftriaxone was added 4/24_ 4/27- switched to [facility] 4/28 and plan to continue for 10 days. Cont per ID.Left renal double-J stent is stable in position on CT abdomen 4/26.  Deep tissue injury/sacral wound- continue antibiotic as above, flagyl added 4/27.  Wound care on board, surgery has seen-no need for debridement.  MSSA pneumonia. abx as  above.  Tachycardia/Tachypnea- w/ fever. HR appears stable on Coreg (changed from amlodipine). Continue tube feeding 75 cc/h plus free water  Up 200> 250 mL ( 4.28) every 4 hours as he appears dry, and sodium was up.  Septic shock multifactorial 2/2 E. coli pyelonephritis, MSSA pneumonia, MRSE bacteremia with left IJ/[place] septic thrombophlebitis.  Off pressors and transferred out of ICU.  Fever Subsiding antibiotics being managed as above.  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  Acute hypoxic/hypercapnic respiratory failure with MSSA pneumonia: Initially intubated, extubated 4/16.  PCCM recommends BiPAP at bedtime has been needing high flow nasal cannula at 8 L-he was able to come off oxygen 4/24.  PCCM reviewed the chart and signed off 4/25.  Continue nebulizer, as needed supplemental oxygen for comfort, repeated chest x-ray 4/28 am-no substantial change in patchy airspace opacities in the right midlung and history opacities at the bases could represent atelectasis aspiration and/or pneumonia  Mild hypernatremia improved after increasing free water  from 200> 250 ml q4h> reassess tomorrow and may back down.monitor BMP Recent Labs  Lab 01/04/21 0247 01/05/21 0622 01/06/21 0447 01/07/21 0301 01/08/21 0039  NA 144 146* 146* 146* 144   Mild hypokalemia ordered replacement AKI in the setting of sepsis/ATN/pyelonephritis with hydronephrosis status post left ureteral stent 4/6, repeat ultrasound showed mild left hydronephrosis.  CT abdomen stable position of double-J stent.  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  Able to verbalize more today and much more oriented. Continue PT OT speech.  Right wrist to remain on soft restraint , and has weakness in left upper arm.For stroke continue aspirin 81, Lipitor.  Lipid panel 4/12 showed HDL 30 and LDL 83.  Added on low-dose Seroquel bedtime he seems to be responding well.  Repeat CD head 4/26-no new acute finding.  Continue delirium precaution fall precaution supportive care.    Essential hypertension BP is well controlled now on Coreg. Coreg switched from amlodipine given  Tachycardia.  Diabetes mellitus type II on glipizide prior to admission him will HBA1c 6.8.blood sugar is controlled on Lantus 35 u bid, novolog 4 units and q4ssi while on tube feeding.  Continue the same. Recent Labs  Lab 01/07/21 1204 01/07/21 1648 01/07/21 2024 01/07/21 2333 01/08/21 0409  GLUCAP 192* 133* 83 163* 139*   Left IJ/subclavian DVT versus septic thrombus: Appears resolved on repeat duplex 4/25. Pt remains on SQ Lovenox therapeutic dose by PCCM team. Ij LINES OFF in ICU, PICC line has been removed 4/24 RUE.  Chronic microcytic anemia hemoglobin is stable transfuse if less than 7 g  QTC prolonged:Avoid QT prolonging medication maintain electrolytes mag above 2 potassium above 4 and monitor.  Abdominal aortic aneurysm 4.8 cm advised 6 months followed by radiology  Morbid obesity with BMI 35.8.  Would benefit weight loss.  Goals of care: Patient with complicated and prolonged hospitalization with ongoing bacteremia from multiple organisms, with other issues including diabetes liver cirrhosis, a stroke and ongoing encephalopathy, prognosis appears guarded at this time.  Palliative care consultation has been requested to follow along. Remains full code.  I had called and explained to the wife about his overall poor clinical condition. Monitor closely, at risk of decompensation.  Diet Order  DIET - DYS 1 Room service  appropriate? No; Fluid consistency: Honey Thick  Diet effective now                 Nutrition Problem: Increased nutrient needs Etiology: wound healing Signs/Symptoms: estimated needs Interventions: Tube feeding Patient's Body mass index is 34.14 kg/m.  Pressure Injury 12/21/20 Buttocks Right Deep Tissue Pressure Injury - Purple or maroon localized area of discolored intact skin or blood-filled blister due to damage of underlying soft tissue from pressure and/or shear. purple with blister area that has  (Active)  12/21/20 1000  Location: Buttocks  Location Orientation: Right  Staging: Deep Tissue Pressure Injury - Purple or maroon localized area of discolored intact skin or blood-filled blister due to damage of underlying soft tissue from pressure and/or shear.  Wound Description (Comments): purple with blister area that has torn  Present on Admission: No     Pressure Injury 12/21/20 Foot Left;Lateral Deep Tissue Pressure Injury - Purple or maroon localized area of discolored intact skin or blood-filled blister due to damage of underlying soft tissue from pressure and/or shear. .5 cm purple area (Active)  12/21/20 0800  Location: Foot  Location Orientation: Left;Lateral  Staging: Deep Tissue Pressure Injury - Purple or maroon localized area of discolored intact skin or blood-filled blister due to damage of underlying soft tissue from pressure and/or shear.  Wound Description (Comments): .5 cm purple area  Present on Admission: No     Pressure Injury 01/04/21 Buttocks Posterior;Right;Mid Stage 3 -  Full thickness tissue loss. Subcutaneous fat may be visible but bone, tendon or muscle are NOT exposed. medial buttocks where rectal tube laid. (tube removed) (Active)  01/04/21 0800  Location: Buttocks  Location Orientation: Posterior;Right;Mid  Staging: Stage 3 -  Full thickness tissue loss. Subcutaneous fat may be visible but bone, tendon or muscle are NOT exposed.  Wound Description  (Comments): medial buttocks where rectal tube laid. (tube removed)  Present on Admission: No   DVT prophylaxis: SCDs Start: 12/10/20 0737 Lovenox Code Status:   Code Status: Full Code  Family Communication: plan of care discussed with patient wife over the phone multiple times. Prognosis is guarded, wife was updated extensively.  Status ZO:[id number] Remains inpatient appropriate because:Altered mental status, IV treatments appropriate due to intensity of illness or inability to take PO and Inpatient level of care appropriate due to severity of illness  Dispo: The patient is from: Home              Anticipated d/c is to: tbd              Patient currently is not medically stable to d/c.   Difficult to place patient No Unresulted Labs (From admission, onward)          Start     Ordered   01/06/21 0500  CK  Weekly,   R     Question:  Specimen collection method  Answer:  Lab=Lab collect   01/05/21 0837          Medications reviewed:  Scheduled Meds: . acetaminophen  650 mg Oral Q6H  . aspirin  81 mg Per Tube Daily  . atorvastatin  40 mg Per Tube Daily  . carvedilol  3.125 mg Oral BID WC  . chlorhexidine  15 mL Mouth Rinse BID  . Chlorhexidine Gluconate Cloth  6 each Topical Daily  . collagenase   Topical Daily  . docusate  50 mg Per Tube Daily  . DULoxetine  30 mg  Oral Daily  . enoxaparin (LOVENOX) injection  120 mg Subcutaneous Q12H  . feeding supplement (PROSource TF)  90 mL Per Tube QID  . free water  250 mL Per Tube Q4H  . insulin aspart  0-20 Units Subcutaneous Q4H  . insulin aspart  8 Units Subcutaneous Q4H  . insulin detemir  35 Units Subcutaneous BID  . ipratropium-albuterol  3 mL Nebulization BID  . mouth rinse  15 mL Mouth Rinse q12n4p  . metroNIDAZOLE  500 mg Oral Q12H  . nutrition supplement (JUVEN)  1 packet Per Tube BID BM  . oxyCODONE  10 mg Per Tube Q6H  . polyethylene glycol  17 g Per Tube Daily  . QUEtiapine  25 mg Oral QHS  . sodium chloride flush   10-40 mL Intracatheter Q12H  . vitamin B-12  100 mcg Per Tube Daily   Continuous Infusions: . sodium chloride Stopped (12/31/20 1020)  .  ceFAZolin (ANCEF) IV 2 g (01/08/21 0526)  . DAPTOmycin (CUBICIN)  IV 900 mg (01/07/21 2034)  . feeding supplement (OSMOLITE 1.2 CAL) 1,000 mL (01/08/21 0010)    Consultants:see note  Procedures:  CT chest/abd/pelvis: 1.Double-J ureteral stent is stable in position in the left kidney. 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 5.0 cm distal abdominal aortic aneurysm. Recommend follow-up CT/MR every 6 months and vascular consultation. This recommendation follows ACR consensus guidelines: White Paper of the ACR Incidental Findings Committee II on Vascular Findings. J Am Coll Radiol 2013; 10:789-794. 5. Improved bibasilar airspace disease  Antimicrobials: Anti-infectives (From admission, onward)   Start     Dose/Rate Route Frequency Ordered Stop   01/07/21 0945  ceFAZolin (ANCEF) IVPB 2g/100 mL premix        2 g 200 mL/hr over 30 Minutes Intravenous Every 8 hours 01/07/21 0855     01/06/21 1000  metroNIDAZOLE (FLAGYL) tablet 500 mg        500 mg Oral Every 12 hours 01/06/21 0903     01/05/21 1700  DAPTOmycin (CUBICIN) 900 mg in sodium chloride 0.9 % IVPB  Status:  Discontinued        9 mg/kg  97.6 kg (Adjusted) 136 mL/hr over 30 Minutes Intravenous Daily 01/05/21 0837 01/05/21 0845   01/05/21 1500  DAPTOmycin (CUBICIN) 900 mg in sodium chloride 0.9 % IVPB        9 mg/kg  97.6 kg (Adjusted) 136 mL/hr over 30 Minutes Intravenous Daily 01/05/21 0845     01/05/21 0800  cefTRIAXone (ROCEPHIN) 2 g in sodium chloride 0.9 % 100 mL IVPB  Status:  Discontinued        2 g 200 mL/hr over 30 Minutes Intravenous Every 24 hours 01/04/21 0436 01/07/21 0855   01/04/21 0530  cefTRIAXone (ROCEPHIN) 2 g in sodium chloride 0.9 % 100 mL IVPB        2 g 200 mL/hr over 30 Minutes  Intravenous  Once 01/04/21 0436 01/04/21 0709   01/04/21 0500  vancomycin (VANCOREADY) IVPB 1000 mg/200 mL  Status:  Discontinued        1,000 mg 200 mL/hr over 60 Minutes Intravenous Every 12 hours 01/03/21 2216 01/05/21 0837   01/01/21 0600  vancomycin (VANCOREADY) IVPB 500 mg/100 mL  Status:  Discontinued        500 mg 100 mL/hr over 60 Minutes Intravenous Every 12 hours 12/31/20 1036 01/03/21 2216   12/29/20 0600  vancomycin (VANCOREADY) IVPB 1000 mg/200  mL  Status:  Discontinued        1,000 mg 200 mL/hr over 60 Minutes Intravenous Every 24 hours 12/28/20 0713 12/31/20 1036   12/25/20 0600  vancomycin (VANCOREADY) IVPB 1500 mg/300 mL  Status:  Discontinued        1,500 mg 150 mL/hr over 120 Minutes Intravenous Every 24 hours 12/24/20 1840 12/28/20 0713   12/24/20 1915  vancomycin (VANCOCIN) 2,500 mg in sodium chloride 0.9 % 500 mL IVPB        2,500 mg 250 mL/hr over 120 Minutes Intravenous  Once 12/24/20 1820 12/24/20 2147   12/24/20 1000  cefTRIAXone (ROCEPHIN) 2 g in sodium chloride 0.9 % 100 mL IVPB  Status:  Discontinued        2 g 200 mL/hr over 30 Minutes Intravenous Every 24 hours 12/24/20 0856 12/25/20 1607   12/17/20 2200  cefTRIAXone (ROCEPHIN) 2 g in sodium chloride 0.9 % 100 mL IVPB        2 g 200 mL/hr over 30 Minutes Intravenous Every 24 hours 12/17/20 1123 12/21/20 2135   12/16/20 1100  ceFEPIme (MAXIPIME) 2 g in sodium chloride 0.9 % 100 mL IVPB  Status:  Discontinued        2 g 200 mL/hr over 30 Minutes Intravenous Every 12 hours 12/16/20 0946 12/17/20 1123   12/14/20 1400  ceFAZolin (ANCEF) IVPB 2g/100 mL premix  Status:  Discontinued        2 g 200 mL/hr over 30 Minutes Intravenous Every 8 hours 12/14/20 1223 12/16/20 0946   12/10/20 1530  cefTRIAXone (ROCEPHIN) 2 g in sodium chloride 0.9 % 100 mL IVPB  Status:  Discontinued        2 g 200 mL/hr over 30 Minutes Intravenous Every 24 hours 12/10/20 1436 12/14/20 1223   12/10/20 1200  piperacillin-tazobactam  (ZOSYN) IVPB 2.25 g  Status:  Discontinued        2.25 g 100 mL/hr over 30 Minutes Intravenous Every 6 hours 12/10/20 0740 12/10/20 1436   12/10/20 0600  vancomycin (VANCOREADY) IVPB 1500 mg/300 mL        1,500 mg 150 mL/hr over 120 Minutes Intravenous STAT 12/10/20 0546 12/10/20 0821   12/10/20 0400  vancomycin (VANCOREADY) IVPB 1000 mg/200 mL        1,000 mg 200 mL/hr over 60 Minutes Intravenous  Once 12/10/20 0350 12/10/20 0520   12/10/20 0400  piperacillin-tazobactam (ZOSYN) IVPB 3.375 g        3.375 g 12.5 mL/hr over 240 Minutes Intravenous  Once 12/10/20 0350 12/10/20 0450     Culture/Microbiology    Component Value Date/Time   SDES URINE, RANDOM 01/05/2021 0738   SPECREQUEST NONE 01/05/2021 0738   CULT  01/05/2021 0738    NO GROWTH Performed at [facility], [address] [place], [place] [address]    REPTSTATUS 01/06/2021 FINAL 01/05/2021 6045    Other culture-see note  Objective: Vitals: Today's Vitals   01/07/21 2000 01/07/21 2241 01/07/21 2332 01/08/21 0400  BP: 138/67  130/64 132/69  Pulse: 98  100 93  Resp:   20 (!) 22  Temp: 97.8 F (36.6 C)  99.4 F (37.4 C) 98.2 F (36.8 C)  TempSrc: Oral  Axillary Oral  SpO2: 95% 96% 95% 96%  Weight:      Height:      PainSc: 0-No pain  0-No pain     Intake/Output Summary (Last 24 hours) at 01/08/2021 0739 Last data filed at 01/08/2021 0400 Gross per  24 hour  Intake 450 ml  Output 1600 ml  Net -1150 ml   Filed Weights   12/28/20 0345 12/29/20 0400 01/03/21 0500  Weight: 127.8 kg 126.7 kg 120.6 kg   Weight change:   Intake/Output from previous day: 04/28 0701 - 04/29 0700 In: 450 [NG/GT:250; IV Piggyback:200] Out: 1600 [Urine:1500; Emesis/NG output:100] Intake/Output this shift: No intake/output data recorded. Filed Weights   12/28/20 0345 12/29/20 0400 01/03/21 0500  Weight: 127.8 kg 126.7 kg 120.6 kg    Examination: General exam: AAOx 2-3older than stated age, weak appearing. HEENT:Oral  mucosa moist, Ear/Nose WNL grossly, dentition normal. Respiratory system: bilaterally diminished, no crackles,no use of accessory muscle Cardiovascular system: S1 & S2 +, No JVD,. Gastrointestinal system: Abdomen soft, obese NT,ND, BS+ Nervous System:Alert, awake, LUE weak, good strength in lower extremities and right RUE Extremities: no edema, distal peripheral pulses palpable.  Skin: No rashes,no icterus. MSK: Normal muscle bulk,tone, power    Data Reviewed: I have personally reviewed following labs and imaging studies CBC: Recent Labs  Lab 01/04/21 0247 01/05/21 0800 01/06/21 0447 01/07/21 0301 01/08/21 0039  WBC 11.7* 11.7* 13.9* 9.7 9.4  NEUTROABS  --  9.6*  --   --   --   HGB 9.5* 9.7* 9.6* 9.0* 8.5*  HCT 29.9* 30.5* 31.2* 29.0* 26.9*  MCV 98.7 98.4 101.3* 100.7* 99.3  PLT 190 186 201 188 193   Basic Metabolic Panel: Recent Labs  Lab 01/02/21 0020 01/03/21 0243 01/04/21 0247 01/05/21 0622 01/06/21 0447 01/07/21 0301 01/08/21 0039  NA 145   < > 144 146* 146* 146* 144  K 4.0   < > 3.4* 3.7 3.8 3.5 3.3*  CL 111   < > 108 111 112* 108 111  CO2 28   < > 27 27 25 30 26   GLUCOSE 166*   < > 106* 136* 182* 131* 170*  BUN 55*   < > 57* 56* 52* 54* 53*  CREATININE 1.20   < > 0.99 1.03 1.00 0.94 0.88  CALCIUM 8.8*   < > 8.7* 8.7* 8.8* 8.8* 8.6*  MG 2.2  --   --   --   --   --   --    < > = values in this interval not displayed.   GFR: Estimated Creatinine Clearance: 126.3 mL/min (by C-G formula based on SCr of 0.88 mg/dL). Liver Function Tests: Recent Labs  Lab 01/02/21 0020  AST 22  ALT 14  ALKPHOS 62  BILITOT 0.5  PROT 7.2  ALBUMIN 2.2*   No results for input(s): LIPASE, AMYLASE in the last 168 hours. Recent Labs  Lab 01/04/21 1050  AMMONIA 34   Coagulation Profile: No results for input(s): INR, PROTIME in the last 168 hours. Cardiac Enzymes: Recent Labs  Lab 01/06/21 0447  CKTOTAL 129   BNP (last 3 results) No results for input(s): PROBNP in  the last 8760 hours. HbA1C: No results for input(s): HGBA1C in the last 72 hours. CBG: Recent Labs  Lab 01/07/21 1204 01/07/21 1648 01/07/21 2024 01/07/21 2333 01/08/21 0409  GLUCAP 192* 133* 83 163* 139*   Lipid Profile: No results for input(s): CHOL, HDL, LDLCALC, TRIG, CHOLHDL, LDLDIRECT in the last 72 hours. Thyroid Function Tests: No results for input(s): TSH, T4TOTAL, FREET4, T3FREE, THYROIDAB in the last 72 hours. Anemia Panel: No results for input(s): VITAMINB12, FOLATE, FERRITIN, TIBC, IRON, RETICCTPCT in the last 72 hours. Sepsis Labs: No results for input(s): PROCALCITON, LATICACIDVEN in the last 168 hours.  Recent Results (from the past 240 hour(s))  Culture, Urine     Status: None   Collection Time: 01/02/21  4:06 AM   Specimen: Urine, Random  Result Value Ref Range Status   Specimen Description URINE, RANDOM  Final   Special Requests NONE  Final   Culture   Final    NO GROWTH Performed at [facility], [address], [place], [place] [address]    Report Status 01/03/2021 FINAL  Final  Culture, blood (routine x 2)     Status: Abnormal   Collection Time: 01/02/21  4:19 AM   Specimen: BLOOD LEFT HAND  Result Value Ref Range Status   Specimen Description BLOOD LEFT HAND  Final   Special Requests AEROBIC BOTTLE ONLY Blood Culture adequate volume  Final   Culture  Setup Time   Final    GRAM POSITIVE COCCI IN CLUSTERS AEROBIC BOTTLE ONLY CRITICAL VALUE NOTED.  VALUE IS CONSISTENT WITH PREVIOUSLY REPORTED AND CALLED VALUE.    Culture (A)  Final    STAPHYLOCOCCUS EPIDERMIDIS SUSCEPTIBILITIES PERFORMED ON PREVIOUS CULTURE WITHIN THE LAST 5 DAYS. Performed at [facility], [address], [place], [place] [address]    Report Status 01/05/2021 FINAL  Final  Culture, blood (routine x 2)     Status: Abnormal   Collection Time: 01/02/21  4:32 AM   Specimen: BLOOD LEFT HAND  Result Value Ref Range Status   Specimen Description BLOOD LEFT HAND  Final    Special Requests   Final    BOTTLES DRAWN AEROBIC AND ANAEROBIC Blood Culture results may not be optimal due to an inadequate volume of blood received in culture bottles   Culture  Setup Time   Final    GRAM POSITIVE COCCI ANAEROBIC BOTTLE ONLY CRITICAL RESULT CALLED TO, READ BACK BY AND VERIFIED WITH: PHARMD GREG ABBOTT 01/03/2021 AT 0630 A.HUGHES Performed at [facility], [address] [place], [place] [address]    Culture STAPHYLOCOCCUS EPIDERMIDIS (A)  Final   Report Status 01/05/2021 FINAL  Final   Organism ID, Bacteria STAPHYLOCOCCUS EPIDERMIDIS  Final      Susceptibility   Staphylococcus epidermidis - MIC*    CIPROFLOXACIN >=8 RESISTANT Resistant     ERYTHROMYCIN >=8 RESISTANT Resistant     GENTAMICIN >=16 RESISTANT Resistant     OXACILLIN >=4 RESISTANT Resistant     TETRACYCLINE 2 SENSITIVE Sensitive     VANCOMYCIN 1 SENSITIVE Sensitive     TRIMETH/SULFA 80 RESISTANT Resistant     CLINDAMYCIN >=8 RESISTANT Resistant     RIFAMPIN <=0.5 SENSITIVE Sensitive     Inducible Clindamycin NEGATIVE Sensitive     * STAPHYLOCOCCUS EPIDERMIDIS  Blood Culture ID Panel (Reflexed)     Status: Abnormal   Collection Time: 01/02/21  4:32 AM  Result Value Ref Range Status   Enterococcus faecalis NOT DETECTED NOT DETECTED Final   Enterococcus Faecium NOT DETECTED NOT DETECTED Final   Listeria monocytogenes NOT DETECTED NOT DETECTED Final   Staphylococcus species DETECTED (A) NOT DETECTED Final    Comment: CRITICAL RESULT CALLED TO, READ BACK BY AND VERIFIED WITH: PHARMD GREG ABBOTT 01/03/2021 AT 0630 A.HUGHES    Staphylococcus aureus (BCID) NOT DETECTED NOT DETECTED Final   Staphylococcus epidermidis DETECTED (A) NOT DETECTED Final    Comment: Methicillin (oxacillin) resistant coagulase negative staphylococcus. Possible blood culture contaminant (unless isolated from more than one blood culture draw or clinical case suggests pathogenicity). No antibiotic treatment is indicated for  blood  culture contaminants. CRITICAL RESULT CALLED TO, READ BACK BY AND VERIFIED WITH: PHARMD GREG ABBOTT 01/03/2021 AT 0630 A.HUGHES    Staphylococcus lugdunensis NOT DETECTED NOT DETECTED Final   Streptococcus species NOT DETECTED NOT DETECTED Final   Streptococcus agalactiae NOT DETECTED NOT DETECTED Final   Streptococcus pneumoniae NOT DETECTED NOT DETECTED Final   Streptococcus pyogenes NOT DETECTED NOT DETECTED Final   A.calcoaceticus-baumannii NOT DETECTED NOT DETECTED Final   Bacteroides fragilis NOT DETECTED NOT DETECTED Final   Enterobacterales NOT DETECTED NOT DETECTED Final   Enterobacter cloacae complex NOT DETECTED NOT DETECTED Final   Escherichia coli NOT DETECTED NOT DETECTED Final   Klebsiella aerogenes NOT DETECTED NOT DETECTED Final   Klebsiella oxytoca NOT DETECTED NOT DETECTED Final   Klebsiella pneumoniae NOT DETECTED NOT DETECTED Final   Proteus species NOT DETECTED NOT DETECTED Final   Salmonella species NOT DETECTED NOT DETECTED Final   Serratia marcescens NOT DETECTED NOT DETECTED Final   Haemophilus influenzae NOT DETECTED NOT DETECTED Final   Neisseria meningitidis NOT DETECTED NOT DETECTED Final   Pseudomonas aeruginosa NOT DETECTED NOT DETECTED Final   Stenotrophomonas maltophilia NOT DETECTED NOT DETECTED Final   Candida albicans NOT DETECTED NOT DETECTED Final   Candida auris NOT DETECTED NOT DETECTED Final   Candida glabrata NOT DETECTED NOT DETECTED Final   Candida krusei NOT DETECTED NOT DETECTED Final   Candida parapsilosis NOT DETECTED NOT DETECTED Final   Candida tropicalis NOT DETECTED NOT DETECTED Final   Cryptococcus neoformans/gattii NOT DETECTED NOT DETECTED Final   Methicillin resistance mecA/C DETECTED (A) NOT DETECTED Final    Comment: CRITICAL RESULT CALLED TO, READ BACK BY AND VERIFIED WITH: PHARMD GREG ABBOTT 01/03/2021 AT 0630 A.HUGHES Performed at [facility], [address], [place], [place] [address]   Culture, blood  (Routine X 2) w Reflex to ID Panel     Status: Abnormal   Collection Time: 01/03/21 11:31 AM   Specimen: BLOOD LEFT HAND  Result Value Ref Range Status   Specimen Description BLOOD LEFT HAND  Final   Special Requests   Final    BOTTLES DRAWN AEROBIC ONLY Blood Culture adequate volume   Culture  Setup Time   Final    GRAM NEGATIVE RODS AEROBIC BOTTLE ONLY PHARMD GREG ABBOTT 01/04/2021 AT 0425 A.HUGHES CRITICAL VALUE NOTED.  VALUE IS CONSISTENT WITH PREVIOUSLY REPORTED AND CALLED VALUE.    Culture (A)  Final    ESCHERICHIA COLI SUSCEPTIBILITIES PERFORMED ON PREVIOUS CULTURE WITHIN THE LAST 5 DAYS. Performed at [facility], [address] [place], [place] [address]    Report Status 01/06/2021 FINAL  Final  Culture, blood (Routine X 2) w Reflex to ID Panel     Status: Abnormal   Collection Time: 01/03/21 11:31 AM   Specimen: BLOOD LEFT FOREARM  Result Value Ref Range Status   Specimen Description BLOOD LEFT FOREARM  Final   Special Requests   Final    BOTTLES DRAWN AEROBIC ONLY Blood Culture adequate volume   Culture  Setup Time   Final    GRAM NEGATIVE RODS AEROBIC BOTTLE ONLY CRITICAL RESULT CALLED TO, READ BACK BY AND VERIFIED WITH: PHARMD GREG ABBOTT 01/04/2021 AT 0425 A.HUGHES Performed at [facility], [address] [place], [place] [address]    Culture ESCHERICHIA COLI (A)  Final   Report Status 01/06/2021 FINAL  Final   Organism ID, Bacteria ESCHERICHIA COLI  Final      Susceptibility   Escherichia coli -  MIC*    AMPICILLIN 8 SENSITIVE Sensitive     CEFAZOLIN <=4 SENSITIVE Sensitive     CEFEPIME <=0.12 SENSITIVE Sensitive     CEFTAZIDIME <=1 SENSITIVE Sensitive     CEFTRIAXONE <=0.25 SENSITIVE Sensitive     CIPROFLOXACIN <=0.25 SENSITIVE Sensitive     GENTAMICIN <=1 SENSITIVE Sensitive     IMIPENEM <=0.25 SENSITIVE Sensitive     TRIMETH/SULFA <=20 SENSITIVE Sensitive     AMPICILLIN/SULBACTAM <=2 SENSITIVE Sensitive     PIP/TAZO <=4 SENSITIVE  Sensitive     * ESCHERICHIA COLI  Blood Culture ID Panel (Reflexed)     Status: Abnormal   Collection Time: 01/03/21 11:31 AM  Result Value Ref Range Status   Enterococcus faecalis NOT DETECTED NOT DETECTED Final   Enterococcus Faecium NOT DETECTED NOT DETECTED Final   Listeria monocytogenes NOT DETECTED NOT DETECTED Final   Staphylococcus species NOT DETECTED NOT DETECTED Final   Staphylococcus aureus (BCID) NOT DETECTED NOT DETECTED Final   Staphylococcus epidermidis NOT DETECTED NOT DETECTED Final   Staphylococcus lugdunensis NOT DETECTED NOT DETECTED Final   Streptococcus species NOT DETECTED NOT DETECTED Final   Streptococcus agalactiae NOT DETECTED NOT DETECTED Final   Streptococcus pneumoniae NOT DETECTED NOT DETECTED Final   Streptococcus pyogenes NOT DETECTED NOT DETECTED Final   A.calcoaceticus-baumannii NOT DETECTED NOT DETECTED Final   Bacteroides fragilis NOT DETECTED NOT DETECTED Final   Enterobacterales DETECTED (A) NOT DETECTED Final    Comment: Enterobacterales represent a large order of gram negative bacteria, not a single organism. CRITICAL RESULT CALLED TO, READ BACK BY AND VERIFIED WITH: PHARMD GREG ABBOTT 01/04/2021 AT 0425 A.HUGHES    Enterobacter cloacae complex NOT DETECTED NOT DETECTED Final   Escherichia coli DETECTED (A) NOT DETECTED Final    Comment: CRITICAL RESULT CALLED TO, READ BACK BY AND VERIFIED WITH: PHARMD GREG ABBOTT 01/04/2021 AT 0425 A.HUGHES    Klebsiella aerogenes NOT DETECTED NOT DETECTED Final   Klebsiella oxytoca NOT DETECTED NOT DETECTED Final   Klebsiella pneumoniae NOT DETECTED NOT DETECTED Final   Proteus species NOT DETECTED NOT DETECTED Final   Salmonella species NOT DETECTED NOT DETECTED Final   Serratia marcescens NOT DETECTED NOT DETECTED Final   Haemophilus influenzae NOT DETECTED NOT DETECTED Final   Neisseria meningitidis NOT DETECTED NOT DETECTED Final   Pseudomonas aeruginosa NOT DETECTED NOT DETECTED Final    Stenotrophomonas maltophilia NOT DETECTED NOT DETECTED Final   Candida albicans NOT DETECTED NOT DETECTED Final   Candida auris NOT DETECTED NOT DETECTED Final   Candida glabrata NOT DETECTED NOT DETECTED Final   Candida krusei NOT DETECTED NOT DETECTED Final   Candida parapsilosis NOT DETECTED NOT DETECTED Final   Candida tropicalis NOT DETECTED NOT DETECTED Final   Cryptococcus neoformans/gattii NOT DETECTED NOT DETECTED Final   CTX-M ESBL NOT DETECTED NOT DETECTED Final   Carbapenem resistance IMP NOT DETECTED NOT DETECTED Final   Carbapenem resistance KPC NOT DETECTED NOT DETECTED Final   Carbapenem resistance NDM NOT DETECTED NOT DETECTED Final   Carbapenem resist OXA 48 LIKE NOT DETECTED NOT DETECTED Final   Carbapenem resistance VIM NOT DETECTED NOT DETECTED Final    Comment: Performed at [facility], [address] [place], [place] [address]  Culture, blood (routine x 2)     Status: None (Preliminary result)   Collection Time: 01/05/21  6:10 AM   Specimen: BLOOD RIGHT HAND  Result Value Ref Range Status   Specimen Description BLOOD RIGHT HAND  Final   Special  Requests   Final    BOTTLES DRAWN AEROBIC ONLY Blood Culture adequate volume   Culture   Final    NO GROWTH 2 DAYS Performed at [facility], [address], [place], [place] [address]    Report Status PENDING  Incomplete  Culture, blood (routine x 2)     Status: None (Preliminary result)   Collection Time: 01/05/21  6:11 AM   Specimen: BLOOD RIGHT HAND  Result Value Ref Range Status   Specimen Description BLOOD RIGHT HAND  Final   Special Requests   Final    BOTTLES DRAWN AEROBIC ONLY Blood Culture adequate volume   Culture   Final    NO GROWTH 2 DAYS Performed at [facility], [address], [place], [place] [address]    Report Status PENDING  Incomplete  Culture, Urine     Status: None   Collection Time: 01/05/21  7:38 AM   Specimen: Urine, Random  Result Value Ref Range Status    Specimen Description URINE, RANDOM  Final   Special Requests NONE  Final   Culture   Final    NO GROWTH Performed at [facility], [address], [place], [place] [address]    Report Status 01/06/2021 FINAL  Final     Radiology Studies: DG Abd 1 View  Result Date: 01/07/2021 CLINICAL DATA:  NG tube placement. EXAM: ABDOMEN - 1 VIEW COMPARISON:  CT 01/05/2021. FINDINGS: Interim removal of NG tube and placement of feeding tube. Feeding tube tip noted over the stomach. Double-J left ureteral stent noted. Oral contrast in the colon. No bowel distention. Bibasilar atelectasis. IMPRESSION: Feeding tube noted with tip over stomach. Electronically Signed   By: Maisie Fus  Register   On: 01/07/2021 15:06   DG Chest Port 1 View  Result Date: 01/07/2021 CLINICAL DATA:  Shortness of breath and altered mental status. EXAM: PORTABLE CHEST 1 VIEW COMPARISON:  January 03, 2021. FINDINGS: Low lung volumes. Similar patchy opacities in the right midlung and streaky bibasilar opacities. Suspected small right pleural effusion given fluid tracking along the fissure. No visible pneumothorax on this single AP semi erect radiograph. Similar cardiac silhouette. Gastric tube courses below the diaphragm with the tip outside the field of view. IMPRESSION: 1. No substantial change in patchy airspace opacities in the right midlung and streaky opacities at the bases, which could represent atelectasis, aspiration, and/or pneumonia. 2. Suspected small right pleural effusion given small volume of fluid tracking along the right fissure. Dedicated PA and lateral radiographs could better characterize if clinically indicated. Electronically Signed   By: Feliberto Harts MD   On: 01/07/2021 08:56     LOS: 29 days   Lanae Boast, MD [facility]  01/08/2021, 7:39 AM

## 2021-01-08 NOTE — Progress Notes (Signed)
Physical Therapy Wound Treatment Patient Details  Name: [name] MRN: [id number] Date of Birth: 09/27/1960  Today's Date: 01/08/2021 Time: 5374-8270 Time Calculation (min): 40 min  Subjective  Subjective Assessment Subjective: R wrist restraint. Pleasant and talkative throughout session however confused at times and talking to people who were not present. Patient and Family Stated Goals: None stated throughout session. Date of Onset:  (Unknown) Prior Treatments:  (Dressing changes)  Pain Score:  Pt tolerated treatment well without complaints of pain.   Wound Assessment  Pressure Injury 12/21/20 Buttocks Right Deep Tissue Pressure Injury - Purple or maroon localized area of discolored intact skin or blood-filled blister due to damage of underlying soft tissue from pressure and/or shear. purple with blister area that has  (Active)  Wound Image   01/08/21 1400  Dressing Type ABD;Barrier Film (skin prep);Gauze (Comment);Moist to moist;Santyl 01/08/21 1400  Dressing Changed;Clean;Dry;Intact 01/08/21 1400  Dressing Change Frequency Monday, Wednesday, Friday 01/08/21 1400  State of Healing Non-healing 01/08/21 1400  Site / Wound Assessment Pink;Yellow;Black 01/08/21 1400  % Wound base Red or Granulating 40% 01/08/21 1400  % Wound base Yellow/Fibrinous Exudate 40% 01/08/21 1400  % Wound base Black/Eschar 20% 01/08/21 1400  % Wound base Other/Granulation Tissue (Comment) 0% 01/08/21 1400  Peri-wound Assessment Erythema (blanchable);Intact 01/08/21 1400  Wound Length (cm) 11 cm 01/08/21 1100  Wound Width (cm) 7 cm 01/08/21 1100  Wound Depth (cm) 4.8 cm 01/08/21 1100  Wound Surface Area (cm^2) 77 cm^2 01/08/21 1100  Wound Volume (cm^3) 369.6 cm^3 01/08/21 1100  Tunneling (cm) 0 01/08/21 1400  Undermining (cm) 0 01/08/21 1400  Margins Unattached edges (unapproximated) 01/08/21 1400  Drainage Amount Minimal 01/08/21 1400  Drainage Description Serosanguineous 01/08/21 1400  Treatment  Debridement (Selective);Hydrotherapy (Pulse lavage);Packing (Saline gauze) 01/08/21 1400      Hydrotherapy Pulsed lavage therapy - wound location: R buttock Pulsed Lavage with Suction (psi): 12 psi Pulsed Lavage with Suction - Normal Saline Used: 1000 mL Pulsed Lavage Tip: Tip with splash shield Selective Debridement Selective Debridement - Location: R buttock Selective Debridement - Tools Used: Forceps,Scalpel,Scissors Selective Debridement - Tissue Removed: unviable tissue    Wound Assessment and Plan  Wound Therapy - Assess/Plan/Recommendations Wound Therapy - Clinical Statement: Most of the tissue at the wound bed continues to appear to be adipose (a mixture of non-viable and viable) with increasing pre-granulation tissue. This pt wil benefit from continued hydrotherapy for selective debridement of unviable tissue, to decrease bioburden and promote wound bed healing. Wound Therapy - Functional Problem List: Global weakness in the setting of critical illness and extended ICU stay. Factors Delaying/Impairing Wound Healing: Immobility,Multiple medical problems,Polypharmacy,Infection - systemic/local Hydrotherapy Plan: Dressing change,Patient/family education,Pulsatile lavage with suction,Debridement Wound Therapy - Frequency: 3X / week (hydrotherapy MWF with nursing to continue with daily dressing changes in between.) Wound Therapy - Follow Up Recommendations: dressing changes by RN  Wound Therapy Goals- Improve the function of patient's integumentary system by progressing the wound(s) through the phases of wound healing (inflammation - proliferation - remodeling) by: Wound Therapy Goals - Improve the function of patient's integumentary system by progressing the wound(s) through the phases of wound healing by: Decrease Necrotic Tissue to: 25% Decrease Necrotic Tissue - Progress: Progressing toward goal Increase Granulation Tissue to: 75% Increase Granulation Tissue - Progress:  Progressing toward goal Improve Drainage Characteristics: Min,Serous Improve Drainage Characteristics - Progress: Progressing toward goal Goals/treatment plan/discharge plan were made with and agreed upon by patient/family: No, Patient unable to participate in goals/treatment/discharge plan and family unavailable  Time For Goal Achievement: 7 days Wound Therapy - Potential for Goals: Good  Goals will be updated until maximal potential achieved or discharge criteria met.  Discharge criteria: when goals achieved, discharge from hospital, MD decision/surgical intervention, no progress towards goals, refusal/missing three consecutive treatments without notification or medical reason.  GP     Charges PT Wound Care Charges $Wound Debridement up to 20 cm: < or equal to 20 cm $ Wound Debridement each add'l 20 sqcm: 2 $PT PLS Gun and Tip: 1 Supply $PT Hydrotherapy Visit: 1 Visit       Thelma Comp 01/08/2021, 2:47 PM   Rolinda Roan, PT, DPT Acute Rehabilitation Services Pager: [phone] Office: [phone]

## 2021-01-08 NOTE — Progress Notes (Signed)
[facility] Infectious Diseases Follow Up Note  Patient Identification: Patient Name: [name] MRN: [id number] [place] Date: 12/10/2020  3:36 AM Age: 60 y.o.Today's Date: 01/08/2021   Reason for Visit: Follow-up on fevers and bacteremia  Active Problems:   Acute respiratory failure ([place])   AKI (acute kidney injury) ([place])   Bacteremia due to Escherichia coli   Ureteral stone   Pyelonephritis   Cerebral thrombosis with cerebral infarction   Staphylococcus epidermidis bacteremia   Pressure injury of skin    Antibiotics:Ceftriaxone 3/31-4/3,4/7-4/11,4/14-4/15,ceftriaxone 4/24-28 cefazolin 4/28-current                    Vancomycin 4/14-4/25, Daptomycin 4/26 - current                    Metronidazole 4/27- current  Lines/Tubes:PIV's,urethral catheter,NG tube    Assessment #mrse bacteremia (clabsi) with thrombus left IJ/left subclavian vein. picc removed 4/24. Repeat doppler 4/25 previous dvt's in left ij/SV resolved.   -continue daptomycin for 6 weeks from 4/24   #fever. Unclear source ?right buttock decub vs ecoli bacteremia.  Wound care team doing hydrotherapy. No clinical evidence abscess or deeper infection. Fever had resolved since 4/27  -continue cefazolin/metronidazole plan 10 days previously per dr [place] until 5/04   #recurrent ecoli bacteremia in setting previous pyelo and obstructive stone, s/p stent placement 4/06. Current ecoli 4/24 from bsi similar susceptibility to previous, probably gu source. Although picc tip was never cultured so that could be a source as well  -continue cefazolin  -agree if recurrent ecoli or mrse bacteremia, now that picc removed and no ureteral obstruction, would order tee if congruent with goals of care   #mssa pna s/p treatment. inactive  Recurrent E. coli bacteremia in the setting of prior E. coli bacteremia and pyelonephritis complicated by  hydronephrosis status post stent placement on 4/6 and appropriate antibiotic treatment  Left Ureteral stone Liver cirrhosis  goals of care discussion ongoing    I spent more than 35 minute reviewing data/chart, and coordinating care and >50% direct face to face time providing counseling/discussing diagnostics/treatment plan with patient  ______________________________________________________________________ Subjective Feeling well Getting wound care Denies nv. Some loose stools. No rash  Per wound team, buttock decub looking better  No fever since 4/27  Vitals BP 140/75 (BP Location: Left Leg)   Pulse (!) 102   Temp 98.5 F (36.9 C) (Oral)   Resp 16   Ht _0  (1.88 m)   Wt 120.6 kg   SpO2 96%   BMI 34.14 kg/m   Physical Exam General/constitutional: no distress, pleasant; obese HEENT: Normocephalic, PER, Conj Clear, EOMI, Oropharynx clear Neck supple CV: rrr no mrg Lungs: clear to auscultation, normal respiratory effort Abd: Soft, Nontender Ext: no edema Skin: No Rash; right buttock stage 4 decub with some eschar still overlaying healthy appearing muscles; no tunneling MSK: no peripheral joint swelling/tenderness/warmth; back spines nontender     Pertinent Microbiology Results for orders placed or performed during the hospital encounter of 12/10/20  Culture, blood (routine x 2)     Status: Abnormal   Collection Time: 12/10/20  3:50 AM   Specimen: BLOOD  Result Value Ref Range Status   Specimen Description BLOOD LEFT ANTECUBITAL  Final   Special Requests   Final    BOTTLES DRAWN AEROBIC AND ANAEROBIC Blood Culture adequate volume   Culture  Setup Time   Final    GRAM NEGATIVE RODS IN BOTH AEROBIC AND ANAEROBIC BOTTLES CRITICAL RESULT CALLED  TO, READ BACK BY AND VERIFIED WITHJeanette Caprice [facility] 0240 12/10/20 A BROWNING Performed at [facility], [place] [address], [place], [place] [address]    Culture ESCHERICHIA COLI (A)  Final   Report Status 12/12/2020  FINAL  Final   Organism ID, Bacteria ESCHERICHIA COLI  Final      Susceptibility   Escherichia coli - MIC*    AMPICILLIN 8 SENSITIVE Sensitive     CEFAZOLIN <=4 SENSITIVE Sensitive     CEFEPIME <=0.12 SENSITIVE Sensitive     CEFTAZIDIME <=1 SENSITIVE Sensitive     CEFTRIAXONE <=0.25 SENSITIVE Sensitive     CIPROFLOXACIN <=0.25 SENSITIVE Sensitive     GENTAMICIN <=1 SENSITIVE Sensitive     IMIPENEM <=0.25 SENSITIVE Sensitive     TRIMETH/SULFA <=20 SENSITIVE Sensitive     AMPICILLIN/SULBACTAM <=2 SENSITIVE Sensitive     PIP/TAZO <=4 SENSITIVE Sensitive     * ESCHERICHIA COLI  Blood Culture ID Panel (Reflexed)     Status: Abnormal   Collection Time: 12/10/20  3:50 AM  Result Value Ref Range Status   Enterococcus faecalis NOT DETECTED NOT DETECTED Final   Enterococcus Faecium NOT DETECTED NOT DETECTED Final   Listeria monocytogenes NOT DETECTED NOT DETECTED Final   Staphylococcus species NOT DETECTED NOT DETECTED Final   Staphylococcus aureus (BCID) NOT DETECTED NOT DETECTED Final   Staphylococcus epidermidis NOT DETECTED NOT DETECTED Final   Staphylococcus lugdunensis NOT DETECTED NOT DETECTED Final   Streptococcus species NOT DETECTED NOT DETECTED Final   Streptococcus agalactiae NOT DETECTED NOT DETECTED Final   Streptococcus pneumoniae NOT DETECTED NOT DETECTED Final   Streptococcus pyogenes NOT DETECTED NOT DETECTED Final   A.calcoaceticus-baumannii NOT DETECTED NOT DETECTED Final   Bacteroides fragilis NOT DETECTED NOT DETECTED Final   Enterobacterales DETECTED (A) NOT DETECTED Final    Comment: Enterobacterales represent a large order of gram negative bacteria, not a single organism. CRITICAL RESULT CALLED TO, READ BACK BY AND VERIFIED WITH: Jeanette Caprice PHARMD [id number] 12/10/20 A BROWNING    Enterobacter cloacae complex NOT DETECTED NOT DETECTED Final   Escherichia coli DETECTED (A) NOT DETECTED Final    Comment: CRITICAL RESULT CALLED TO, READ BACK BY AND VERIFIED WITH: Jeanette Caprice  PHARMD [id number] 12/10/20 A BROWNING    Klebsiella aerogenes NOT DETECTED NOT DETECTED Final   Klebsiella oxytoca NOT DETECTED NOT DETECTED Final   Klebsiella pneumoniae NOT DETECTED NOT DETECTED Final   Proteus species NOT DETECTED NOT DETECTED Final   Salmonella species NOT DETECTED NOT DETECTED Final   Serratia marcescens NOT DETECTED NOT DETECTED Final   Haemophilus influenzae NOT DETECTED NOT DETECTED Final   Neisseria meningitidis NOT DETECTED NOT DETECTED Final   Pseudomonas aeruginosa NOT DETECTED NOT DETECTED Final   Stenotrophomonas maltophilia NOT DETECTED NOT DETECTED Final   Candida albicans NOT DETECTED NOT DETECTED Final   Candida auris NOT DETECTED NOT DETECTED Final   Candida glabrata NOT DETECTED NOT DETECTED Final   Candida krusei NOT DETECTED NOT DETECTED Final   Candida parapsilosis NOT DETECTED NOT DETECTED Final   Candida tropicalis NOT DETECTED NOT DETECTED Final   Cryptococcus neoformans/gattii NOT DETECTED NOT DETECTED Final   CTX-M ESBL NOT DETECTED NOT DETECTED Final   Carbapenem resistance IMP NOT DETECTED NOT DETECTED Final   Carbapenem resistance KPC NOT DETECTED NOT DETECTED Final   Carbapenem resistance NDM NOT DETECTED NOT DETECTED Final   Carbapenem resist OXA 48 LIKE NOT DETECTED NOT DETECTED Final   Carbapenem resistance VIM NOT DETECTED NOT  DETECTED Final    Comment: Performed at [facility], [place] [address], [place], [place] [address]  Resp Panel by RT-PCR (Flu A&B, Covid) Nasopharyngeal Swab     Status: None   Collection Time: 12/10/20  3:59 AM   Specimen: Nasopharyngeal Swab; Nasopharyngeal(NP) swabs in vial transport medium  Result Value Ref Range Status   SARS Coronavirus 2 by RT PCR NEGATIVE NEGATIVE Final    Comment: (NOTE) SARS-CoV-2 target nucleic acids are NOT DETECTED.  The SARS-CoV-2 RNA is generally detectable in upper respiratory specimens during the acute phase of infection. The lowest concentration of SARS-CoV-2 viral copies  this assay can detect is 138 copies/mL. A negative result does not preclude SARS-Cov-2 infection and should not be used as the sole basis for treatment or other patient management decisions. A negative result may occur with  improper specimen collection/handling, submission of specimen other than nasopharyngeal swab, presence of viral mutation(s) within the areas targeted by this assay, and inadequate number of viral copies(<138 copies/mL). A negative result must be combined with clinical observations, patient history, and epidemiological information. The expected result is Negative.  Fact Sheet for Patients:  EntrepreneurPulse.com.au  Fact Sheet for Healthcare Providers:  IncredibleEmployment.be  This test is no t yet approved or cleared by the [place] FDA and  has been authorized for detection and/or diagnosis of SARS-CoV-2 by FDA under an Emergency Use Authorization (EUA). This EUA will remain  in effect (meaning this test can be used) for the duration of the COVID-19 declaration under Section 564(b)(1) of the Act, 21 U.S.C.section 360bbb-3(b)(1), unless the authorization is terminated  or revoked sooner.       Influenza A by PCR NEGATIVE NEGATIVE Final   Influenza B by PCR NEGATIVE NEGATIVE Final    Comment: (NOTE) The Xpert Xpress SARS-CoV-2/FLU/RSV plus assay is intended as an aid in the diagnosis of influenza from Nasopharyngeal swab specimens and should not be used as a sole basis for treatment. Nasal washings and aspirates are unacceptable for Xpert Xpress SARS-CoV-2/FLU/RSV testing.  Fact Sheet for Patients: EntrepreneurPulse.com.au  Fact Sheet for Healthcare Providers: IncredibleEmployment.be  This test is not yet approved or cleared by the [place] FDA and has been authorized for detection and/or diagnosis of SARS-CoV-2 by FDA under an Emergency Use Authorization (EUA). This EUA  will remain in effect (meaning this test can be used) for the duration of the COVID-19 declaration under Section 564(b)(1) of the Act, 21 U.S.C. section 360bbb-3(b)(1), unless the authorization is terminated or revoked.  Performed at [facility], [place] [address], [place], [place] [address]   Culture, blood (routine x 2)     Status: Abnormal   Collection Time: 12/10/20  4:06 AM   Specimen: BLOOD RIGHT HAND  Result Value Ref Range Status   Specimen Description BLOOD RIGHT HAND  Final   Special Requests   Final    BOTTLES DRAWN AEROBIC AND ANAEROBIC Blood Culture results may not be optimal due to an inadequate volume of blood received in culture bottles   Culture  Setup Time   Final    GRAM NEGATIVE RODS IN BOTH AEROBIC AND ANAEROBIC BOTTLES IDENTIFICATION TO FOLLOW CRITICAL RESULT CALLED TO, READ BACK BY AND VERIFIED WITHJeanette Caprice PHARMD 1431 12/10/20 A BROWNING    Culture (A)  Final    ESCHERICHIA COLI SUSCEPTIBILITIES PERFORMED ON PREVIOUS CULTURE WITHIN THE LAST 5 DAYS. Performed at [facility], [place] [address], [place], [place] [address]    Report  Status 12/12/2020 FINAL  Final  Urine culture     Status: Abnormal   Collection Time: 12/10/20  4:40 AM   Specimen: Urine, Clean Catch  Result Value Ref Range Status   Specimen Description URINE, CLEAN CATCH  Final   Special Requests   Final    NONE Performed at [facility], [address] [place], [place] [address]    Culture >=100,000 COLONIES/mL ESCHERICHIA COLI (A)  Final   Report Status 12/12/2020 FINAL  Final   Organism ID, Bacteria ESCHERICHIA COLI (A)  Final      Susceptibility   Escherichia coli - MIC*    AMPICILLIN 8 SENSITIVE Sensitive     CEFAZOLIN <=4 SENSITIVE Sensitive     CEFEPIME <=0.12 SENSITIVE Sensitive     CEFTRIAXONE <=0.25 SENSITIVE Sensitive     CIPROFLOXACIN <=0.25 SENSITIVE Sensitive     GENTAMICIN <=1 SENSITIVE Sensitive     IMIPENEM <=0.25 SENSITIVE Sensitive      NITROFURANTOIN <=16 SENSITIVE Sensitive     TRIMETH/SULFA <=20 SENSITIVE Sensitive     AMPICILLIN/SULBACTAM <=2 SENSITIVE Sensitive     PIP/TAZO <=4 SENSITIVE Sensitive     * >=100,000 COLONIES/mL ESCHERICHIA COLI  MRSA PCR Screening     Status: None   Collection Time: 12/10/20 11:24 AM   Specimen: Nasal Mucosa; Nasopharyngeal  Result Value Ref Range Status   MRSA by PCR NEGATIVE NEGATIVE Final    Comment:        The GeneXpert MRSA Assay (FDA approved for NASAL specimens only), is one component of a comprehensive MRSA colonization surveillance program. It is not intended to diagnose MRSA infection nor to guide or monitor treatment for MRSA infections. Performed at [facility], [place] [address] [place], [place] [address]   Culture, Respiratory w Gram Stain     Status: None   Collection Time: 12/15/20  7:39 AM   Specimen: Tracheal Aspirate; Respiratory  Result Value Ref Range Status   Specimen Description TRACHEAL ASPIRATE  Final   Special Requests NONE  Final   Gram Stain   Final    RARE WBC PRESENT, PREDOMINANTLY PMN MODERATE GRAM NEGATIVE RODS RARE GRAM POSITIVE COCCI    Culture   Final    RARE Consistent with normal respiratory flora. No Pseudomonas species isolated Performed at [place] [address] [place], [place] [address]    Report Status 12/17/2020 FINAL  Final  Culture, BAL-quantitative w Gram Stain     Status: Abnormal   Collection Time: 12/15/20  9:36 AM   Specimen: Bronchoalveolar Lavage; Respiratory  Result Value Ref Range Status   Specimen Description BRONCHIAL ALVEOLAR LAVAGE  Final   Special Requests NONE  Final   Gram Stain   Final    FEW WBC PRESENT,BOTH PMN AND MONONUCLEAR NO ORGANISMS SEEN    Culture (A)  Final    3,000 COLONIES/mL Consistent with normal respiratory flora. Performed at [facility], [place] [address], [place], [place] [address]    Report Status 12/17/2020 FINAL  Final  Urine Culture     Status: None    Collection Time: 12/16/20  9:53 PM   Specimen: Urine, Cystoscope  Result Value Ref Range Status   Specimen Description URINE, RANDOM  Final   Special Requests PT ON CEFATAN,CYSTO URINE  Final   Culture   Final    NO GROWTH Performed at [facility], [place] [address], [place], [place] [address]    Report Status 12/18/2020 FINAL  Final  Culture, Respiratory w Gram Stain     Status: None   Collection Time: 12/20/20 11:45 AM   Specimen: Tracheal Aspirate; Respiratory  Result Value Ref Range Status   Specimen Description TRACHEAL ASPIRATE  Final   Special Requests NONE  Final   Gram Stain   Final    ABUNDANT WBC PRESENT, PREDOMINANTLY PMN FEW SQUAMOUS EPITHELIAL CELLS PRESENT MODERATE GRAM POSITIVE COCCI IN CLUSTERS RARE GRAM POSITIVE RODS RARE GRAM NEGATIVE RODS Performed at [facility], [place] [address], [place], [place] [address]    Culture FEW STAPHYLOCOCCUS AUREUS  Final   Report Status 12/24/2020 FINAL  Final   Organism ID, Bacteria STAPHYLOCOCCUS AUREUS  Final      Susceptibility   Staphylococcus aureus - MIC*    CIPROFLOXACIN <=0.5 SENSITIVE Sensitive     ERYTHROMYCIN <=0.25 SENSITIVE Sensitive     GENTAMICIN <=0.5 SENSITIVE Sensitive     OXACILLIN 0.5 SENSITIVE Sensitive     TETRACYCLINE <=1 SENSITIVE Sensitive     VANCOMYCIN <=0.5 SENSITIVE Sensitive     TRIMETH/SULFA <=10 SENSITIVE Sensitive     CLINDAMYCIN <=0.25 SENSITIVE Sensitive     RIFAMPIN <=0.5 SENSITIVE Sensitive     Inducible Clindamycin NEGATIVE Sensitive     * FEW STAPHYLOCOCCUS AUREUS  Culture, blood (Routine X 2) w Reflex to ID Panel     Status: Abnormal   Collection Time: 12/25/20  8:07 AM   Specimen: BLOOD LEFT HAND  Result Value Ref Range Status   Specimen Description BLOOD LEFT HAND  Final   Special Requests   Final    BOTTLES DRAWN AEROBIC ONLY Blood Culture results may not be optimal due to an inadequate volume of blood received in culture bottles   Culture  Setup Time   Final    GRAM  POSITIVE COCCI IN CLUSTERS AEROBIC BOTTLE ONLY    Culture (A)  Final    STAPHYLOCOCCUS EPIDERMIDIS SUSCEPTIBILITIES PERFORMED ON PREVIOUS CULTURE WITHIN THE LAST 5 DAYS. Performed at [facility], [place] [address], [place], [place] [address]    Report Status 12/27/2020 FINAL  Final  Culture, blood (Routine X 2) w Reflex to ID Panel     Status: Abnormal   Collection Time: 12/25/20  8:12 AM   Specimen: BLOOD RIGHT HAND  Result Value Ref Range Status   Specimen Description BLOOD RIGHT HAND  Final   Special Requests   Final    BOTTLES DRAWN AEROBIC ONLY Blood Culture results may not be optimal due to an inadequate volume of blood received in culture bottles   Culture  Setup Time   Final    GRAM POSITIVE COCCI IN CLUSTERS AEROBIC BOTTLE ONLY CRITICAL RESULT CALLED TO, READ BACK BY AND VERIFIED WITH: PHARMD E.WOLFE AT 9735 ON 12/25/2020 BY T.SAAD Performed at [facility], [place] [address], [place], [place] [address]    Culture STAPHYLOCOCCUS EPIDERMIDIS (A)  Final   Report Status 12/27/2020 FINAL  Final   Organism ID, Bacteria STAPHYLOCOCCUS EPIDERMIDIS  Final      Susceptibility   Staphylococcus epidermidis - MIC*    CIPROFLOXACIN >=8 RESISTANT Resistant     ERYTHROMYCIN >=8 RESISTANT Resistant     GENTAMICIN 8 INTERMEDIATE Intermediate     OXACILLIN >=4 RESISTANT Resistant     TETRACYCLINE 2 SENSITIVE Sensitive     VANCOMYCIN 1 SENSITIVE Sensitive     TRIMETH/SULFA 80 RESISTANT Resistant     CLINDAMYCIN >=8 RESISTANT Resistant     RIFAMPIN <=0.5 SENSITIVE Sensitive  Inducible Clindamycin NEGATIVE Sensitive     * STAPHYLOCOCCUS EPIDERMIDIS  Blood Culture ID Panel (Reflexed)     Status: Abnormal   Collection Time: 12/25/20  8:12 AM  Result Value Ref Range Status   Enterococcus faecalis NOT DETECTED NOT DETECTED Final   Enterococcus Faecium NOT DETECTED NOT DETECTED Final   Listeria monocytogenes NOT DETECTED NOT DETECTED Final   Staphylococcus species DETECTED (A)  NOT DETECTED Final    Comment: CRITICAL RESULT CALLED TO, READ BACK BY AND VERIFIED WITH: PHARMD E.WOLFE AT 1256 ON 12/25/2020 BY T.SAAD.    Staphylococcus aureus (BCID) NOT DETECTED NOT DETECTED Final   Staphylococcus epidermidis DETECTED (A) NOT DETECTED Final    Comment: Methicillin (oxacillin) resistant coagulase negative staphylococcus. Possible blood culture contaminant (unless isolated from more than one blood culture draw or clinical case suggests pathogenicity). No antibiotic treatment is indicated for blood  culture contaminants.    Staphylococcus lugdunensis NOT DETECTED NOT DETECTED Final   Streptococcus species NOT DETECTED NOT DETECTED Final   Streptococcus agalactiae NOT DETECTED NOT DETECTED Final   Streptococcus pneumoniae NOT DETECTED NOT DETECTED Final   Streptococcus pyogenes NOT DETECTED NOT DETECTED Final   A.calcoaceticus-baumannii NOT DETECTED NOT DETECTED Final   Bacteroides fragilis NOT DETECTED NOT DETECTED Final   Enterobacterales NOT DETECTED NOT DETECTED Final   Enterobacter cloacae complex NOT DETECTED NOT DETECTED Final   Escherichia coli NOT DETECTED NOT DETECTED Final   Klebsiella aerogenes NOT DETECTED NOT DETECTED Final   Klebsiella oxytoca NOT DETECTED NOT DETECTED Final   Klebsiella pneumoniae NOT DETECTED NOT DETECTED Final   Proteus species NOT DETECTED NOT DETECTED Final   Salmonella species NOT DETECTED NOT DETECTED Final   Serratia marcescens NOT DETECTED NOT DETECTED Final   Haemophilus influenzae NOT DETECTED NOT DETECTED Final   Neisseria meningitidis NOT DETECTED NOT DETECTED Final   Pseudomonas aeruginosa NOT DETECTED NOT DETECTED Final   Stenotrophomonas maltophilia NOT DETECTED NOT DETECTED Final   Candida albicans NOT DETECTED NOT DETECTED Final   Candida auris NOT DETECTED NOT DETECTED Final   Candida glabrata NOT DETECTED NOT DETECTED Final   Candida krusei NOT DETECTED NOT DETECTED Final   Candida parapsilosis NOT DETECTED NOT  DETECTED Final   Candida tropicalis NOT DETECTED NOT DETECTED Final   Cryptococcus neoformans/gattii NOT DETECTED NOT DETECTED Final   Methicillin resistance mecA/C DETECTED (A) NOT DETECTED Final    Comment: CRITICAL RESULT CALLED TO, READ BACK BY AND VERIFIED WITH: PHARMD E.WOLFE AT 1256 ON 12/25/2020 BY T.SAAD. Performed at [facility], [place] [address], [place], [place] [address]   Culture, blood (routine x 2)     Status: None   Collection Time: 12/26/20  5:43 AM   Specimen: BLOOD  Result Value Ref Range Status   Specimen Description BLOOD RIGHT ANTECUBITAL  Final   Special Requests   Final    BOTTLES DRAWN AEROBIC AND ANAEROBIC Blood Culture adequate volume   Culture   Final    NO GROWTH 5 DAYS Performed at [facility], [place] [address], [place], [place] [address]    Report Status 12/31/2020 FINAL  Final  Culture, blood (routine x 2)     Status: None   Collection Time: 12/26/20  5:43 AM   Specimen: BLOOD RIGHT HAND  Result Value Ref Range Status   Specimen Description BLOOD RIGHT HAND  Final   Special Requests   Final    BOTTLES DRAWN AEROBIC ONLY Blood Culture results may not be optimal due  to an inadequate volume of blood received in culture bottles   Culture   Final    NO GROWTH 5 DAYS Performed at [facility], [place] [address] [place], [place] [address]    Report Status 12/31/2020 FINAL  Final  Culture, Urine     Status: None   Collection Time: 01/02/21  4:06 AM   Specimen: Urine, Random  Result Value Ref Range Status   Specimen Description URINE, RANDOM  Final   Special Requests NONE  Final   Culture   Final    NO GROWTH Performed at [facility], [place] [address], [place], [place] [address]    Report Status 01/03/2021 FINAL  Final  Culture, blood (routine x 2)     Status: Abnormal   Collection Time: 01/02/21  4:19 AM   Specimen: BLOOD LEFT HAND  Result Value Ref Range Status   Specimen Description BLOOD LEFT HAND  Final   Special Requests  AEROBIC BOTTLE ONLY Blood Culture adequate volume  Final   Culture  Setup Time   Final    GRAM POSITIVE COCCI IN CLUSTERS AEROBIC BOTTLE ONLY CRITICAL VALUE NOTED.  VALUE IS CONSISTENT WITH PREVIOUSLY REPORTED AND CALLED VALUE.    Culture (A)  Final    STAPHYLOCOCCUS EPIDERMIDIS SUSCEPTIBILITIES PERFORMED ON PREVIOUS CULTURE WITHIN THE LAST 5 DAYS. Performed at [facility], [place] [address] [place], [place] [address]    Report Status 01/05/2021 FINAL  Final  Culture, blood (routine x 2)     Status: Abnormal   Collection Time: 01/02/21  4:32 AM   Specimen: BLOOD LEFT HAND  Result Value Ref Range Status   Specimen Description BLOOD LEFT HAND  Final   Special Requests   Final    BOTTLES DRAWN AEROBIC AND ANAEROBIC Blood Culture results may not be optimal due to an inadequate volume of blood received in culture bottles   Culture  Setup Time   Final    GRAM POSITIVE COCCI ANAEROBIC BOTTLE ONLY CRITICAL RESULT CALLED TO, READ BACK BY AND VERIFIED WITH: PHARMD GREG ABBOTT 01/03/2021 AT 0630 A.HUGHES Performed at [facility], [place] [address] [place], [place] [address]    Culture STAPHYLOCOCCUS EPIDERMIDIS (A)  Final   Report Status 01/05/2021 FINAL  Final   Organism ID, Bacteria STAPHYLOCOCCUS EPIDERMIDIS  Final      Susceptibility   Staphylococcus epidermidis - MIC*    CIPROFLOXACIN >=8 RESISTANT Resistant     ERYTHROMYCIN >=8 RESISTANT Resistant     GENTAMICIN >=16 RESISTANT Resistant     OXACILLIN >=4 RESISTANT Resistant     TETRACYCLINE 2 SENSITIVE Sensitive     VANCOMYCIN 1 SENSITIVE Sensitive     TRIMETH/SULFA 80 RESISTANT Resistant     CLINDAMYCIN >=8 RESISTANT Resistant     RIFAMPIN <=0.5 SENSITIVE Sensitive     Inducible Clindamycin NEGATIVE Sensitive     * STAPHYLOCOCCUS EPIDERMIDIS  Blood Culture ID Panel (Reflexed)     Status: Abnormal   Collection Time: 01/02/21  4:32 AM  Result Value Ref Range Status   Enterococcus faecalis NOT DETECTED NOT DETECTED  Final   Enterococcus Faecium NOT DETECTED NOT DETECTED Final   Listeria monocytogenes NOT DETECTED NOT DETECTED Final   Staphylococcus species DETECTED (A) NOT DETECTED Final    Comment: CRITICAL RESULT CALLED TO, READ BACK BY AND VERIFIED WITH: PHARMD GREG ABBOTT 01/03/2021 AT 0630 A.HUGHES    Staphylococcus aureus (BCID) NOT DETECTED NOT DETECTED Final   Staphylococcus epidermidis DETECTED (A)  NOT DETECTED Final    Comment: Methicillin (oxacillin) resistant coagulase negative staphylococcus. Possible blood culture contaminant (unless isolated from more than one blood culture draw or clinical case suggests pathogenicity). No antibiotic treatment is indicated for blood  culture contaminants. CRITICAL RESULT CALLED TO, READ BACK BY AND VERIFIED WITH: PHARMD GREG ABBOTT 01/03/2021 AT 0630 A.HUGHES    Staphylococcus lugdunensis NOT DETECTED NOT DETECTED Final   Streptococcus species NOT DETECTED NOT DETECTED Final   Streptococcus agalactiae NOT DETECTED NOT DETECTED Final   Streptococcus pneumoniae NOT DETECTED NOT DETECTED Final   Streptococcus pyogenes NOT DETECTED NOT DETECTED Final   A.calcoaceticus-baumannii NOT DETECTED NOT DETECTED Final   Bacteroides fragilis NOT DETECTED NOT DETECTED Final   Enterobacterales NOT DETECTED NOT DETECTED Final   Enterobacter cloacae complex NOT DETECTED NOT DETECTED Final   Escherichia coli NOT DETECTED NOT DETECTED Final   Klebsiella aerogenes NOT DETECTED NOT DETECTED Final   Klebsiella oxytoca NOT DETECTED NOT DETECTED Final   Klebsiella pneumoniae NOT DETECTED NOT DETECTED Final   Proteus species NOT DETECTED NOT DETECTED Final   Salmonella species NOT DETECTED NOT DETECTED Final   Serratia marcescens NOT DETECTED NOT DETECTED Final   Haemophilus influenzae NOT DETECTED NOT DETECTED Final   Neisseria meningitidis NOT DETECTED NOT DETECTED Final   Pseudomonas aeruginosa NOT DETECTED NOT DETECTED Final   Stenotrophomonas maltophilia NOT DETECTED  NOT DETECTED Final   Candida albicans NOT DETECTED NOT DETECTED Final   Candida auris NOT DETECTED NOT DETECTED Final   Candida glabrata NOT DETECTED NOT DETECTED Final   Candida krusei NOT DETECTED NOT DETECTED Final   Candida parapsilosis NOT DETECTED NOT DETECTED Final   Candida tropicalis NOT DETECTED NOT DETECTED Final   Cryptococcus neoformans/gattii NOT DETECTED NOT DETECTED Final   Methicillin resistance mecA/C DETECTED (A) NOT DETECTED Final    Comment: CRITICAL RESULT CALLED TO, READ BACK BY AND VERIFIED WITH: PHARMD GREG ABBOTT 01/03/2021 AT 0630 A.HUGHES Performed at [facility], [place] [address], [place], [place] [address]   Culture, blood (Routine X 2) w Reflex to ID Panel     Status: Abnormal   Collection Time: 01/03/21 11:31 AM   Specimen: BLOOD LEFT HAND  Result Value Ref Range Status   Specimen Description BLOOD LEFT HAND  Final   Special Requests   Final    BOTTLES DRAWN AEROBIC ONLY Blood Culture adequate volume   Culture  Setup Time   Final    GRAM NEGATIVE RODS AEROBIC BOTTLE ONLY PHARMD GREG ABBOTT 01/04/2021 AT 0425 A.HUGHES CRITICAL VALUE NOTED.  VALUE IS CONSISTENT WITH PREVIOUSLY REPORTED AND CALLED VALUE.    Culture (A)  Final    ESCHERICHIA COLI SUSCEPTIBILITIES PERFORMED ON PREVIOUS CULTURE WITHIN THE LAST 5 DAYS. Performed at [facility], [place] [address], [place], [place] [address]    Report Status 01/06/2021 FINAL  Final  Culture, blood (Routine X 2) w Reflex to ID Panel     Status: Abnormal   Collection Time: 01/03/21 11:31 AM   Specimen: BLOOD LEFT FOREARM  Result Value Ref Range Status   Specimen Description BLOOD LEFT FOREARM  Final   Special Requests   Final    BOTTLES DRAWN AEROBIC ONLY Blood Culture adequate volume   Culture  Setup Time   Final    GRAM NEGATIVE RODS AEROBIC BOTTLE ONLY CRITICAL RESULT CALLED TO, READ BACK BY AND VERIFIED WITH: PHARMD GREG ABBOTT 01/04/2021 AT 0425 A.HUGHES Performed at [place], [place]  [address], [place], [place] [address]    Culture ESCHERICHIA COLI (A)  Final   Report Status 01/06/2021 FINAL  Final   Organism ID, Bacteria ESCHERICHIA COLI  Final      Susceptibility   Escherichia coli - MIC*    AMPICILLIN 8 SENSITIVE Sensitive     CEFAZOLIN <=4 SENSITIVE Sensitive     CEFEPIME <=0.12 SENSITIVE Sensitive     CEFTAZIDIME <=1 SENSITIVE Sensitive     CEFTRIAXONE <=0.25 SENSITIVE Sensitive     CIPROFLOXACIN <=0.25 SENSITIVE Sensitive     GENTAMICIN <=1 SENSITIVE Sensitive     IMIPENEM <=0.25 SENSITIVE Sensitive     TRIMETH/SULFA <=20 SENSITIVE Sensitive     AMPICILLIN/SULBACTAM <=2 SENSITIVE Sensitive     PIP/TAZO <=4 SENSITIVE Sensitive     * ESCHERICHIA COLI  Blood Culture ID Panel (Reflexed)     Status: Abnormal   Collection Time: 01/03/21 11:31 AM  Result Value Ref Range Status   Enterococcus faecalis NOT DETECTED NOT DETECTED Final   Enterococcus Faecium NOT DETECTED NOT DETECTED Final   Listeria monocytogenes NOT DETECTED NOT DETECTED Final   Staphylococcus species NOT DETECTED NOT DETECTED Final   Staphylococcus aureus (BCID) NOT DETECTED NOT DETECTED Final   Staphylococcus epidermidis NOT DETECTED NOT DETECTED Final   Staphylococcus lugdunensis NOT DETECTED NOT DETECTED Final   Streptococcus species NOT DETECTED NOT DETECTED Final   Streptococcus agalactiae NOT DETECTED NOT DETECTED Final   Streptococcus pneumoniae NOT DETECTED NOT DETECTED Final   Streptococcus pyogenes NOT DETECTED NOT DETECTED Final   A.calcoaceticus-baumannii NOT DETECTED NOT DETECTED Final   Bacteroides fragilis NOT DETECTED NOT DETECTED Final   Enterobacterales DETECTED (A) NOT DETECTED Final    Comment: Enterobacterales represent a large order of gram negative bacteria, not a single organism. CRITICAL RESULT CALLED TO, READ BACK BY AND VERIFIED WITH: PHARMD GREG ABBOTT 01/04/2021 AT 0425 A.HUGHES    Enterobacter cloacae complex NOT DETECTED NOT DETECTED Final   Escherichia  coli DETECTED (A) NOT DETECTED Final    Comment: CRITICAL RESULT CALLED TO, READ BACK BY AND VERIFIED WITH: PHARMD GREG ABBOTT 01/04/2021 AT 0425 A.HUGHES    Klebsiella aerogenes NOT DETECTED NOT DETECTED Final   Klebsiella oxytoca NOT DETECTED NOT DETECTED Final   Klebsiella pneumoniae NOT DETECTED NOT DETECTED Final   Proteus species NOT DETECTED NOT DETECTED Final   Salmonella species NOT DETECTED NOT DETECTED Final   Serratia marcescens NOT DETECTED NOT DETECTED Final   Haemophilus influenzae NOT DETECTED NOT DETECTED Final   Neisseria meningitidis NOT DETECTED NOT DETECTED Final   Pseudomonas aeruginosa NOT DETECTED NOT DETECTED Final   Stenotrophomonas maltophilia NOT DETECTED NOT DETECTED Final   Candida albicans NOT DETECTED NOT DETECTED Final   Candida auris NOT DETECTED NOT DETECTED Final   Candida glabrata NOT DETECTED NOT DETECTED Final   Candida krusei NOT DETECTED NOT DETECTED Final   Candida parapsilosis NOT DETECTED NOT DETECTED Final   Candida tropicalis NOT DETECTED NOT DETECTED Final   Cryptococcus neoformans/gattii NOT DETECTED NOT DETECTED Final   CTX-M ESBL NOT DETECTED NOT DETECTED Final   Carbapenem resistance IMP NOT DETECTED NOT DETECTED Final   Carbapenem resistance KPC NOT DETECTED NOT DETECTED Final   Carbapenem resistance NDM NOT DETECTED NOT DETECTED Final   Carbapenem resist OXA 48 LIKE NOT DETECTED NOT DETECTED Final   Carbapenem resistance VIM NOT DETECTED NOT DETECTED Final    Comment: Performed at [facility], [address], [place], [place] [address]  Culture, blood (routine x 2)  Status: None (Preliminary result)   Collection Time: 01/05/21  6:10 AM   Specimen: BLOOD RIGHT HAND  Result Value Ref Range Status   Specimen Description BLOOD RIGHT HAND  Final   Special Requests   Final    BOTTLES DRAWN AEROBIC ONLY Blood Culture adequate volume   Culture   Final    NO GROWTH 3 DAYS Performed at [facility], [address]  [place], [place] [address]    Report Status PENDING  Incomplete  Culture, blood (routine x 2)     Status: None (Preliminary result)   Collection Time: 01/05/21  6:11 AM   Specimen: BLOOD RIGHT HAND  Result Value Ref Range Status   Specimen Description BLOOD RIGHT HAND  Final   Special Requests   Final    BOTTLES DRAWN AEROBIC ONLY Blood Culture adequate volume   Culture   Final    NO GROWTH 3 DAYS Performed at [facility], [place] [address] [place], [place] [address]    Report Status PENDING  Incomplete  Culture, Urine     Status: None   Collection Time: 01/05/21  7:38 AM   Specimen: Urine, Random  Result Value Ref Range Status   Specimen Description URINE, RANDOM  Final   Special Requests NONE  Final   Culture   Final    NO GROWTH Performed at [facility], [place] [address] [place], [place] [address]    Report Status 01/06/2021 FINAL  Final    Pertinent Lab. CBC Latest Ref Rng & Units 01/08/2021 01/07/2021 01/06/2021  WBC 4.0 - 10.5 K/uL 9.4 9.7 13.9(H)  Hemoglobin 13.0 - 17.0 g/dL 8.5(L) 9.0(L) 9.6(L)  Hematocrit 39.0 - 52.0 % 26.9(L) 29.0(L) 31.2(L)  Platelets 150 - 400 K/uL 193 188 201   CMP Latest Ref Rng & Units 01/08/2021 01/07/2021 01/06/2021  Glucose 70 - 99 mg/dL 170(H) 131(H) 182(H)  BUN 6 - 20 mg/dL 53(H) 54(H) 52(H)  Creatinine 0.61 - 1.24 mg/dL 0.88 0.94 1.00  Sodium 135 - 145 mmol/L 144 146(H) 146(H)  Potassium 3.5 - 5.1 mmol/L 3.3(L) 3.5 3.8  Chloride 98 - 111 mmol/L 111 108 112(H)  CO2 22 - 32 mmol/L _0 Calcium 8.9 - 10.3 mg/dL 8.6(L) 8.8(L) 8.8(L)  Total Protein 6.5 - 8.1 g/dL - - -  Total Bilirubin 0.3 - 1.2 mg/dL - - -  Alkaline Phos 38 - 126 U/L - - -  AST 15 - 41 U/L - - -  ALT 0 - 44 U/L - - -     Pertinent Imaging today Plain films and CT images have been personally visualized and interpreted; radiology reports have been reviewed. Decision making incorporated into the Impression / Recommendations.     Jabier Mutton, [place] [facility] [place] [phone]  pager   [phone] cell 01/08/2021, 3:41 PM

## 2021-01-09 DIAGNOSIS — L89316 Pressure-induced deep tissue damage of right buttock: Secondary | ICD-10-CM

## 2021-01-09 LAB — CBC
HCT: 28.1 % — ABNORMAL LOW (ref 39.0–52.0)
Hemoglobin: 9 g/dL — ABNORMAL LOW (ref 13.0–17.0)
MCH: 31.5 pg (ref 26.0–34.0)
MCHC: 32 g/dL (ref 30.0–36.0)
MCV: 98.3 fL (ref 80.0–100.0)
Platelets: 220 K/uL (ref 150–400)
RBC: 2.86 MIL/uL — ABNORMAL LOW (ref 4.22–5.81)
RDW: 14.2 % (ref 11.5–15.5)
WBC: 8.6 K/uL (ref 4.0–10.5)
nRBC: 0 % (ref 0.0–0.2)

## 2021-01-09 LAB — GLUCOSE, CAPILLARY
Glucose-Capillary: 103 mg/dL — ABNORMAL HIGH (ref 70–99)
Glucose-Capillary: 159 mg/dL — ABNORMAL HIGH (ref 70–99)
Glucose-Capillary: 122 mg/dL — ABNORMAL HIGH (ref 70–99)
Glucose-Capillary: 194 mg/dL — ABNORMAL HIGH (ref 70–99)
Glucose-Capillary: 123 mg/dL — ABNORMAL HIGH (ref 70–99)

## 2021-01-09 LAB — BASIC METABOLIC PANEL WITH GFR
Anion gap: 10 (ref 5–15)
BUN: 44 mg/dL — ABNORMAL HIGH (ref 6–20)
CO2: 25 mmol/L (ref 22–32)
Calcium: 8.6 mg/dL — ABNORMAL LOW (ref 8.9–10.3)
Chloride: 111 mmol/L (ref 98–111)
Creatinine, Ser: 0.89 mg/dL (ref 0.61–1.24)
GFR, Estimated: >60 mL/min
Glucose, Bld: 134 mg/dL — ABNORMAL HIGH (ref 70–99)
Potassium: 3.8 mmol/L (ref 3.5–5.1)
Sodium: 146 mmol/L — ABNORMAL HIGH (ref 135–145)

## 2021-01-09 MED ORDER — IPRATROPIUM-ALBUTEROL 0.5-2.5 (3) MG/3ML IN SOLN: 3.0000 mL | RESPIRATORY_TRACT | Status: DC | PRN

## 2021-01-09 NOTE — Plan of Care (Signed)
  Problem: Education: Goal: Knowledge of General Education information will improve Description: Including pain rating scale, medication(s)/side effects and non-pharmacologic comfort measures 01/09/2021 1635 by Carloyn Manner I, LPN Outcome: Progressing =

## 2021-01-09 NOTE — Progress Notes (Signed)
ANTICOAGULATION CONSULT NOTE - Follow Up Consult  Pharmacy Consult for Heparin>>Lovenox Indication: Left IJ and subclavian non-occlusive clots  Labs: Recent Labs    01/07/21 0301 01/08/21 0039 01/09/21 0152  HGB 9.0* 8.5* 9.0*  HCT 29.0* 26.9* 28.1*  PLT 188 193 220  CREATININE 0.94 0.88 0.89   Assessment: 60 year old male admitted for hypoxemic respiratory failure and septic shock 2/2 E. Coli bacteremia and UTI, and ATN. Now with Left IJ and subclavian non-occlusive clots. Heparin dosing weight of 113 kg. Pharmacy initially consulted to start heparin drip, then transitioned to lovenox.   Pt is on full dose lovenox since 4/20. Hgb/plt remain stable  Goal of Therapy:  Anti-xa - 0.6-1 Monitor platelets by anticoagulation protocol: Yes   Plan:  Continue Lovenox 120mg  SQ q12 F/u with oral AC  , PharmD PGY1 Acute Care Pharmacy Resident 01/09/2021 7:49 AM  Please check [email or web address] for unit-specific pharmacy phone numbers.

## 2021-01-09 NOTE — Progress Notes (Signed)
PROGRESS NOTE    [name]  WLS:[id number] DOB: 1961-03-06 DOA: 12/10/2020 PCP: Merryl Hacker, No    Brief Narrative:  [name] is a 60 year old male with past medical history significant for essential hypertension, hyperlipidemia who presented to [facility] ED on 3/31 for a 5-day history of nausea/vomiting/diarrhea and confusion.  Patient was initially admitted to [facility] service for acute hypoxic respiratory failure requiring intubation and ventilatory support with septic shock secondary to E. coli septicemia, UTI, MSSA Pneumonia, MRSE bacteremia (CLABSI involving left IJ vein and subclavian vein), with possible endovascular infection.  Infectious disease followed during hospital course.  Patient transferred from [facility] to [facility] on 01/01/2021 for further management of ongoing metabolic encephalopathy, tachycardia, dysphagia requiring tube feeds, intermittent fevers and prolonged antibiotic course.   Assessment & Plan:   Active Problems:   Acute respiratory failure (HCC)   AKI (acute kidney injury) ([place])   Bacteremia due to Escherichia coli   Ureteral stone   Pyelonephritis   Cerebral thrombosis with cerebral infarction   Staphylococcus epidermidis bacteremia   Pressure injury of skin   Acute hypoxic respiratory failure: Resolved MSSA pneumonia On initial presentation to ED, patient was noted to be hypoxic and was subsequently intubated and required vasopressors managed by pulmonary critical care service.  Patient was found to have MSSA pneumonia per respiratory culture 4/10.  Patient successfully extubated on 4/16.  Vasopressors were weaned off.  PCCM now signed off and transferred to [facility] on 4/22. --Supplemental oxygen now weaned off, on room air with SPO2 96% -- Nebs as needed -- BiPAP nightly  MRSE endovascular infection/septicemia E. coli UTI/septicemia Septic shock, POA Blood cultures and urine culture 3/31 positive for E. coli.  Blood culture 4/15 positive for Staph epidermidis.   Blood culture 4/23 positive for Staph epidermidis.  Blood culture 4/24 positive for E. Coli.  He 12/11/2020 with LVEF 40-45%, LV global hypokinesis, grade 2 diastolic dysfunction, mild dilation aortic root 41 mm, RV systolic function normal.  EEG 4/15 with no evidence of valvular vegetations but poor/challenging study.  Limited TTE 4/25 with improved LVEF to 60-65%. --Blood cultures x2 4/26: No growth x4 days --Urine culture 4/26 no growth -- Infectious disease following, appreciate assistance --Daptomycin 900 mg IV daily x 6 weeks from 4/24 --Cefazolin 2 g IV every 8 hours x 10 days (End 5/4)  Right buttocks deep tissue injury Pressure Injury 12/21/20 Buttocks Right Deep Tissue Pressure Injury - Purple or maroon localized area of discolored intact skin or blood-filled blister due to damage of underlying soft tissue from pressure and/or shear. purple with blister area that has  (Active)  12/21/20 1000  Location: Buttocks  Location Orientation: Right  Staging: Deep Tissue Pressure Injury - Purple or maroon localized area of discolored intact skin or blood-filled blister due to damage of underlying soft tissue from pressure and/or shear.  Wound Description (Comments): purple with blister area that has torn  Present on Admission: No     Pressure Injury 12/21/20 Foot Left;Lateral Deep Tissue Pressure Injury - Purple or maroon localized area of discolored intact skin or blood-filled blister due to damage of underlying soft tissue from pressure and/or shear. .5 cm purple area (Active)  12/21/20 0800  Location: Foot  Location Orientation: Left;Lateral  Staging: Deep Tissue Pressure Injury - Purple or maroon localized area of discolored intact skin or blood-filled blister due to damage of underlying soft tissue from pressure and/or shear.  Wound Description (Comments): .5 cm purple area  Present on Admission: No  Pressure Injury 01/04/21 Buttocks Posterior;Right;Mid Stage 3 -  Full thickness tissue  loss. Subcutaneous fat may be visible but bone, tendon or muscle are NOT exposed. medial buttocks where rectal tube laid. (tube removed) (Active)  01/04/21 0800  Location: Buttocks  Location Orientation: Posterior;Right;Mid  Staging: Stage 3 -  Full thickness tissue loss. Subcutaneous fat may be visible but bone, tendon or muscle are NOT exposed.  Wound Description (Comments): medial buttocks where rectal tube laid. (tube removed)  Present on Admission: No  --Continue antibiotics as above, Flagyl 500 mg per tube q12h. --Seen by general surgery 4/28, no need for debridement --Wound care following --continue hydrotherapy  Essential hypertension BP 136/67 this morning, well controlled. -- Carvedilol 3.125 mg twice daily -- Continue aspirin and statin  HLD: Atorvastatin 40 mg daily  NASH cirrhosis: Outpatient follow-up with gastroenterology  Mild hyponatremia Sodium 146.  Free water flushes via tube feeds increased recently.  Continue to monitor BMP daily.  Hypokalemia: Resolved Repleted.  Potassium 3.8 today.  On tube feeds  Acute renal failure Etiology likely secondary to septic shock, ATN, pyelonephritis, with findings of hydronephrosis. Underwent left ureteral stent 4/6.  Repeat ultrasound with mild left hydronephrosis.  CT abdomen with stable position of double-J stent.  Acute toxic metabolic encephalopathy with ongoing delirium Subacute versus chronic infarct with left upper extremity weakness Dysphagia MR brain with 9 mm of late subacute versus chronic ischemic infarct involving posterior right frontal centrum semiovale, MRA head/neck unrevealing without significant stenosis. Seen by neurology.  --continue tube feeds per nutrition via cortrack tube --continue SLP efforts --continue asa and statin --needs LTACH placement  Type 2 diabetes mellitus On glipizide at home.  Hemoglobin A1c 6.8. --Lantus 35 u BID --Novolog 8 u [place] q4h  Left IJ/subclavian DVT versus septic  thrombus Left IJ central line was removed in ICU, PICC line removed 4/24 to right upper extremity.  IV appears resolved on repeat ultrasound duplex scan 4/25. --Continue Lovenox therapeutic dose  Chronic microcytic anemia: Hemoglobin stable, 9.0.  Prolonged QTC --Avoid QT prolonging medications --Goal magnesium > 2 and potassium > 4  Morbid obesity Body mass index is 37.36 kg/m.  Would benefit from weight loss as this complicates all facets of care.  Weakness/deconditioning/debility: --Continue PT/OT efforts --Recommendations of LTAC placement  Goals of care: Patient with complicated and prolonged hospitalization with ongoing bacteremia from multiple organisms, underlying cirrhosis, CVA and ongoing encephalopathy.  Prognosis remains guarded.  Palliative care has been consulted.  Patient remains full code.  Prognosis has been relayed to his spouse on multiple occasions given his overall poor clinical condition.    DVT prophylaxis:    Code Status: Full Code Family Communication:   Disposition Plan:  Level of care: Progressive Status is: Inpatient  Remains inpatient appropriate because:Unsafe d/c plan, IV treatments appropriate due to intensity of illness or inability to take PO and Inpatient level of care appropriate due to severity of illness   Dispo: The patient is from: Home              Anticipated d/c is to: LTAC              Patient currently is not medically stable to d/c.   Difficult to place patient No   Consultants:   PCCM  Urology Dr. Claudia Desanctis, Dr. Matilde Sprang  Nephrology, Dr. Burnett Sheng, Dr. Augustin Coupe  Neurology, Dr. Leonie Man  Infectious disease  Procedures/significant Hospital events:   Intubation 3/31  Triple-lumen central line placement 3/31  4/1 off pressors. E coli  bacteremia, e coli uti. Some improvement in pulm opacities on CXR. On rocephin. TTE with LVEF 40-45%, LV global hypokinesis, G2DD, normal RV, dilated aortic root at 33m.  Bronchoscopy with BAL  4/5, Dr. STamala Julian 4/5 extubated, failed immediately due to mucous plugging identified on bronch after re-intubated. CT ABD with 452mleft ureteral stone with mild edema of left kideny, large areas of consolidation in bilateral lung bases.   4/6 urology consulted for distal left ureteral stone, to OR overnight for stent placement, UCx sent.  Versed added for agitation (on fent / precedex)  4/7 Off vasopressors  4/11 Vasopressors restarted   4/13: Left IJ holiday  4/15: Right IJ placed   4/16: extubated, remained agitated and restless requiring IV Precedex infusion  Track tube insertion 4/18  Antimicrobials:   Daptomycin 4/26>>  Cefazolin 4/4 - 4/6, 4/28>>  Metronidazole 4/27>>  Cefepime 4/6 - 4/7  Ceftriaxone 3/31 - 3/31, 4/7 - 4/11, 4/14 - 4/15  Vancomycin 3/31 - 3/31, 4/14 - 4/24, 4/26 - 4/28  Zosyn 3/31 - 3/31    Subjective: Patient seen and examined bedside, pleasantly confused.  Continues with right wrist restraint in place due to pulling at core track tube.  No family present.  No specific complaints this morning other than requesting blanket to be pulled over his chest.  Denies chest pain, no shortness of breath, no abdominal pain.  No acute concerns overnight per nursing staff.   Objective: Vitals:   01/09/21 0343 01/09/21 0500 01/09/21 0800 01/09/21 1000  BP: 136/67  (!) 143/71 131/64  Pulse: (!) 106     Resp:   19 20  Temp: 98.6 F (37 C)  98.4 F (36.9 C)   TempSrc: Oral     SpO2: 96%     Weight:  132 kg    Height:        Intake/Output Summary (Last 24 hours) at 01/09/2021 1123 Last data filed at 01/08/2021 1718 Gross per 24 hour  Intake --  Output 1150 ml  Net -1150 ml   Filed Weights   12/29/20 0400 01/03/21 0500 01/09/21 0500  Weight: 126.7 kg 120.6 kg 132 kg    Examination:  General exam: Appears calm and comfortable, pleasantly confused, right wrist on restraint, chronically ill in appearance Respiratory system: Clear to auscultation.  Respiratory effort normal.  On room air Cardiovascular system: S1 & S2 heard, RRR. No JVD, murmurs, rubs, gallops or clicks. No pedal edema. Gastrointestinal system: Abdomen is nondistended, soft and nontender. No organomegaly or masses felt. Normal bowel sounds heard.  Core track tube noted with tube feeds infusing Central nervous system: Alert, not oriented to person/place/time or situation.  Left upper extremity weakness noted, otherwise no focal neurological deficits. Extremities: Moving extremities independently, left-sided weakness noted to upper extremity Skin: Right buttock pressure wound noted as depicted below Psychiatry: Judgement and insight appear poor. Mood & affect appropriate.    01/08/21   Data Reviewed: I have personally reviewed following labs and imaging studies  CBC: Recent Labs  Lab 01/05/21 0800 01/06/21 0447 01/07/21 0301 01/08/21 0039 01/09/21 0152  WBC 11.7* 13.9* 9.7 9.4 8.6  NEUTROABS 9.6*  --   --   --   --   HGB 9.7* 9.6* 9.0* 8.5* 9.0*  HCT 30.5* 31.2* 29.0* 26.9* 28.1*  MCV 98.4 101.3* 100.7* 99.3 98.3  PLT 186 201 188 193 22025 Basic Metabolic Panel: Recent Labs  Lab 01/05/21 0622 01/06/21 0447 01/07/21 0301 01/08/21 0039 01/09/21 0152  NA  146* 146* 146* 144 146*  K 3.7 3.8 3.5 3.3* 3.8  CL 111 112* 108 111 111  CO2 _0 GLUCOSE 136* 182* 131* 170* 134*  BUN 56* 52* 54* 53* 44*  CREATININE 1.03 1.00 0.94 0.88 0.89  CALCIUM 8.7* 8.8* 8.8* 8.6* 8.6*   GFR: Estimated Creatinine Clearance: 130.7 mL/min (by C-G formula based on SCr of 0.89 mg/dL). Liver Function Tests: No results for input(s): AST, ALT, ALKPHOS, BILITOT, PROT, ALBUMIN in the last 168 hours. No results for input(s): LIPASE, AMYLASE in the last 168 hours. Recent Labs  Lab 01/04/21 1050  AMMONIA 34   Coagulation Profile: No results for input(s): INR, PROTIME in the last 168 hours. Cardiac Enzymes: Recent Labs  Lab 01/06/21 0447  CKTOTAL 129   BNP (last  3 results) No results for input(s): PROBNP in the last 8760 hours. HbA1C: No results for input(s): HGBA1C in the last 72 hours. CBG: Recent Labs  Lab 01/08/21 1620 01/08/21 1958 01/08/21 2308 01/09/21 0343 01/09/21 0840  GLUCAP 122* 58* 177* 103* 159*   Lipid Profile: No results for input(s): CHOL, HDL, LDLCALC, TRIG, CHOLHDL, LDLDIRECT in the last 72 hours. Thyroid Function Tests: No results for input(s): TSH, T4TOTAL, FREET4, T3FREE, THYROIDAB in the last 72 hours. Anemia Panel: No results for input(s): VITAMINB12, FOLATE, FERRITIN, TIBC, IRON, RETICCTPCT in the last 72 hours. Sepsis Labs: No results for input(s): PROCALCITON, LATICACIDVEN in the last 168 hours.  Recent Results (from the past 240 hour(s))  Culture, Urine     Status: None   Collection Time: 01/02/21  4:06 AM   Specimen: Urine, Random  Result Value Ref Range Status   Specimen Description URINE, RANDOM  Final   Special Requests NONE  Final   Culture   Final    NO GROWTH Performed at [facility], [address], [place], [place] [address]    Report Status 01/03/2021 FINAL  Final  Culture, blood (routine x 2)     Status: Abnormal   Collection Time: 01/02/21  4:19 AM   Specimen: BLOOD LEFT HAND  Result Value Ref Range Status   Specimen Description BLOOD LEFT HAND  Final   Special Requests AEROBIC BOTTLE ONLY Blood Culture adequate volume  Final   Culture  Setup Time   Final    GRAM POSITIVE COCCI IN CLUSTERS AEROBIC BOTTLE ONLY CRITICAL VALUE NOTED.  VALUE IS CONSISTENT WITH PREVIOUSLY REPORTED AND CALLED VALUE.    Culture (A)  Final    STAPHYLOCOCCUS EPIDERMIDIS SUSCEPTIBILITIES PERFORMED ON PREVIOUS CULTURE WITHIN THE LAST 5 DAYS. Performed at [facility], [place] [address], [place], [place] [address]    Report Status 01/05/2021 FINAL  Final  Culture, blood (routine x 2)     Status: Abnormal   Collection Time: 01/02/21  4:32 AM   Specimen: BLOOD LEFT HAND  Result Value Ref Range Status    Specimen Description BLOOD LEFT HAND  Final   Special Requests   Final    BOTTLES DRAWN AEROBIC AND ANAEROBIC Blood Culture results may not be optimal due to an inadequate volume of blood received in culture bottles   Culture  Setup Time   Final    GRAM POSITIVE COCCI ANAEROBIC BOTTLE ONLY CRITICAL RESULT CALLED TO, READ BACK BY AND VERIFIED WITH: PHARMD GREG ABBOTT 01/03/2021 AT 0630 A.HUGHES Performed at [facility], [place] [address], [place], [place] [address]    Culture STAPHYLOCOCCUS EPIDERMIDIS (A)  Final  Report Status 01/05/2021 FINAL  Final   Organism ID, Bacteria STAPHYLOCOCCUS EPIDERMIDIS  Final      Susceptibility   Staphylococcus epidermidis - MIC*    CIPROFLOXACIN >=8 RESISTANT Resistant     ERYTHROMYCIN >=8 RESISTANT Resistant     GENTAMICIN >=16 RESISTANT Resistant     OXACILLIN >=4 RESISTANT Resistant     TETRACYCLINE 2 SENSITIVE Sensitive     VANCOMYCIN 1 SENSITIVE Sensitive     TRIMETH/SULFA 80 RESISTANT Resistant     CLINDAMYCIN >=8 RESISTANT Resistant     RIFAMPIN <=0.5 SENSITIVE Sensitive     Inducible Clindamycin NEGATIVE Sensitive     * STAPHYLOCOCCUS EPIDERMIDIS  Blood Culture ID Panel (Reflexed)     Status: Abnormal   Collection Time: 01/02/21  4:32 AM  Result Value Ref Range Status   Enterococcus faecalis NOT DETECTED NOT DETECTED Final   Enterococcus Faecium NOT DETECTED NOT DETECTED Final   Listeria monocytogenes NOT DETECTED NOT DETECTED Final   Staphylococcus species DETECTED (A) NOT DETECTED Final    Comment: CRITICAL RESULT CALLED TO, READ BACK BY AND VERIFIED WITH: PHARMD GREG ABBOTT 01/03/2021 AT 0630 A.HUGHES    Staphylococcus aureus (BCID) NOT DETECTED NOT DETECTED Final   Staphylococcus epidermidis DETECTED (A) NOT DETECTED Final    Comment: Methicillin (oxacillin) resistant coagulase negative staphylococcus. Possible blood culture contaminant (unless isolated from more than one blood culture draw or clinical case suggests  pathogenicity). No antibiotic treatment is indicated for blood  culture contaminants. CRITICAL RESULT CALLED TO, READ BACK BY AND VERIFIED WITH: PHARMD GREG ABBOTT 01/03/2021 AT 0630 A.HUGHES    Staphylococcus lugdunensis NOT DETECTED NOT DETECTED Final   Streptococcus species NOT DETECTED NOT DETECTED Final   Streptococcus agalactiae NOT DETECTED NOT DETECTED Final   Streptococcus pneumoniae NOT DETECTED NOT DETECTED Final   Streptococcus pyogenes NOT DETECTED NOT DETECTED Final   A.calcoaceticus-baumannii NOT DETECTED NOT DETECTED Final   Bacteroides fragilis NOT DETECTED NOT DETECTED Final   Enterobacterales NOT DETECTED NOT DETECTED Final   Enterobacter cloacae complex NOT DETECTED NOT DETECTED Final   Escherichia coli NOT DETECTED NOT DETECTED Final   Klebsiella aerogenes NOT DETECTED NOT DETECTED Final   Klebsiella oxytoca NOT DETECTED NOT DETECTED Final   Klebsiella pneumoniae NOT DETECTED NOT DETECTED Final   Proteus species NOT DETECTED NOT DETECTED Final   Salmonella species NOT DETECTED NOT DETECTED Final   Serratia marcescens NOT DETECTED NOT DETECTED Final   Haemophilus influenzae NOT DETECTED NOT DETECTED Final   Neisseria meningitidis NOT DETECTED NOT DETECTED Final   Pseudomonas aeruginosa NOT DETECTED NOT DETECTED Final   Stenotrophomonas maltophilia NOT DETECTED NOT DETECTED Final   Candida albicans NOT DETECTED NOT DETECTED Final   Candida auris NOT DETECTED NOT DETECTED Final   Candida glabrata NOT DETECTED NOT DETECTED Final   Candida krusei NOT DETECTED NOT DETECTED Final   Candida parapsilosis NOT DETECTED NOT DETECTED Final   Candida tropicalis NOT DETECTED NOT DETECTED Final   Cryptococcus neoformans/gattii NOT DETECTED NOT DETECTED Final   Methicillin resistance mecA/C DETECTED (A) NOT DETECTED Final    Comment: CRITICAL RESULT CALLED TO, READ BACK BY AND VERIFIED WITH: PHARMD GREG ABBOTT 01/03/2021 AT 0630 A.HUGHES Performed at [facility],  [place] [address] [place], [place] [address]   Culture, blood (Routine X 2) w Reflex to ID Panel     Status: Abnormal   Collection Time: 01/03/21 11:31 AM   Specimen: BLOOD LEFT HAND  Result Value Ref Range Status   Specimen  Description BLOOD LEFT HAND  Final   Special Requests   Final    BOTTLES DRAWN AEROBIC ONLY Blood Culture adequate volume   Culture  Setup Time   Final    GRAM NEGATIVE RODS AEROBIC BOTTLE ONLY PHARMD GREG ABBOTT 01/04/2021 AT 0425 A.HUGHES CRITICAL VALUE NOTED.  VALUE IS CONSISTENT WITH PREVIOUSLY REPORTED AND CALLED VALUE.    Culture (A)  Final    ESCHERICHIA COLI SUSCEPTIBILITIES PERFORMED ON PREVIOUS CULTURE WITHIN THE LAST 5 DAYS. Performed at [facility], [place] [address] [place], [place] [address]    Report Status 01/06/2021 FINAL  Final  Culture, blood (Routine X 2) w Reflex to ID Panel     Status: Abnormal   Collection Time: 01/03/21 11:31 AM   Specimen: BLOOD LEFT FOREARM  Result Value Ref Range Status   Specimen Description BLOOD LEFT FOREARM  Final   Special Requests   Final    BOTTLES DRAWN AEROBIC ONLY Blood Culture adequate volume   Culture  Setup Time   Final    GRAM NEGATIVE RODS AEROBIC BOTTLE ONLY CRITICAL RESULT CALLED TO, READ BACK BY AND VERIFIED WITH: PHARMD GREG ABBOTT 01/04/2021 AT 0425 A.HUGHES Performed at [facility], [place] [address], [place], [place] [address]    Culture ESCHERICHIA COLI (A)  Final   Report Status 01/06/2021 FINAL  Final   Organism ID, Bacteria ESCHERICHIA COLI  Final      Susceptibility   Escherichia coli - MIC*    AMPICILLIN 8 SENSITIVE Sensitive     CEFAZOLIN <=4 SENSITIVE Sensitive     CEFEPIME <=0.12 SENSITIVE Sensitive     CEFTAZIDIME <=1 SENSITIVE Sensitive     CEFTRIAXONE <=0.25 SENSITIVE Sensitive     CIPROFLOXACIN <=0.25 SENSITIVE Sensitive     GENTAMICIN <=1 SENSITIVE Sensitive     IMIPENEM <=0.25 SENSITIVE Sensitive     TRIMETH/SULFA <=20 SENSITIVE Sensitive     AMPICILLIN/SULBACTAM  <=2 SENSITIVE Sensitive     PIP/TAZO <=4 SENSITIVE Sensitive     * ESCHERICHIA COLI  Blood Culture ID Panel (Reflexed)     Status: Abnormal   Collection Time: 01/03/21 11:31 AM  Result Value Ref Range Status   Enterococcus faecalis NOT DETECTED NOT DETECTED Final   Enterococcus Faecium NOT DETECTED NOT DETECTED Final   Listeria monocytogenes NOT DETECTED NOT DETECTED Final   Staphylococcus species NOT DETECTED NOT DETECTED Final   Staphylococcus aureus (BCID) NOT DETECTED NOT DETECTED Final   Staphylococcus epidermidis NOT DETECTED NOT DETECTED Final   Staphylococcus lugdunensis NOT DETECTED NOT DETECTED Final   Streptococcus species NOT DETECTED NOT DETECTED Final   Streptococcus agalactiae NOT DETECTED NOT DETECTED Final   Streptococcus pneumoniae NOT DETECTED NOT DETECTED Final   Streptococcus pyogenes NOT DETECTED NOT DETECTED Final   A.calcoaceticus-baumannii NOT DETECTED NOT DETECTED Final   Bacteroides fragilis NOT DETECTED NOT DETECTED Final   Enterobacterales DETECTED (A) NOT DETECTED Final    Comment: Enterobacterales represent a large order of gram negative bacteria, not a single organism. CRITICAL RESULT CALLED TO, READ BACK BY AND VERIFIED WITH: PHARMD GREG ABBOTT 01/04/2021 AT 0425 A.HUGHES    Enterobacter cloacae complex NOT DETECTED NOT DETECTED Final   Escherichia coli DETECTED (A) NOT DETECTED Final    Comment: CRITICAL RESULT CALLED TO, READ BACK BY AND VERIFIED WITH: PHARMD GREG ABBOTT 01/04/2021 AT 0425 A.HUGHES    Klebsiella aerogenes NOT DETECTED NOT DETECTED Final   Klebsiella oxytoca NOT DETECTED NOT DETECTED Final   Klebsiella pneumoniae NOT DETECTED  NOT DETECTED Final   Proteus species NOT DETECTED NOT DETECTED Final   Salmonella species NOT DETECTED NOT DETECTED Final   Serratia marcescens NOT DETECTED NOT DETECTED Final   Haemophilus influenzae NOT DETECTED NOT DETECTED Final   Neisseria meningitidis NOT DETECTED NOT DETECTED Final   Pseudomonas  aeruginosa NOT DETECTED NOT DETECTED Final   Stenotrophomonas maltophilia NOT DETECTED NOT DETECTED Final   Candida albicans NOT DETECTED NOT DETECTED Final   Candida auris NOT DETECTED NOT DETECTED Final   Candida glabrata NOT DETECTED NOT DETECTED Final   Candida krusei NOT DETECTED NOT DETECTED Final   Candida parapsilosis NOT DETECTED NOT DETECTED Final   Candida tropicalis NOT DETECTED NOT DETECTED Final   Cryptococcus neoformans/gattii NOT DETECTED NOT DETECTED Final   CTX-M ESBL NOT DETECTED NOT DETECTED Final   Carbapenem resistance IMP NOT DETECTED NOT DETECTED Final   Carbapenem resistance KPC NOT DETECTED NOT DETECTED Final   Carbapenem resistance NDM NOT DETECTED NOT DETECTED Final   Carbapenem resist OXA 48 LIKE NOT DETECTED NOT DETECTED Final   Carbapenem resistance VIM NOT DETECTED NOT DETECTED Final    Comment: Performed at [facility], [address] [place], [place] [address]  Culture, blood (routine x 2)     Status: None (Preliminary result)   Collection Time: 01/05/21  6:10 AM   Specimen: BLOOD RIGHT HAND  Result Value Ref Range Status   Specimen Description BLOOD RIGHT HAND  Final   Special Requests   Final    BOTTLES DRAWN AEROBIC ONLY Blood Culture adequate volume   Culture   Final    NO GROWTH 4 DAYS Performed at [facility], [place] [address] [place], [place] [address]    Report Status PENDING  Incomplete  Culture, blood (routine x 2)     Status: None (Preliminary result)   Collection Time: 01/05/21  6:11 AM   Specimen: BLOOD RIGHT HAND  Result Value Ref Range Status   Specimen Description BLOOD RIGHT HAND  Final   Special Requests   Final    BOTTLES DRAWN AEROBIC ONLY Blood Culture adequate volume   Culture   Final    NO GROWTH 4 DAYS Performed at [facility], [place] [address] [place], [place] [address]    Report Status PENDING  Incomplete  Culture, Urine     Status: None   Collection Time: 01/05/21  7:38 AM   Specimen: Urine,  Random  Result Value Ref Range Status   Specimen Description URINE, RANDOM  Final   Special Requests NONE  Final   Culture   Final    NO GROWTH Performed at [facility], [place] [address], [place], [place] [address]    Report Status 01/06/2021 FINAL  Final         Radiology Studies: DG Abd 1 View  Result Date: 01/07/2021 CLINICAL DATA:  NG tube placement. EXAM: ABDOMEN - 1 VIEW COMPARISON:  CT 01/05/2021. FINDINGS: Interim removal of NG tube and placement of feeding tube. Feeding tube tip noted over the stomach. Double-J left ureteral stent noted. Oral contrast in the colon. No bowel distention. Bibasilar atelectasis. IMPRESSION: Feeding tube noted with tip over stomach. Electronically Signed   By: Marcello Moores  Register   On: 01/07/2021 15:06        Scheduled Meds: . acetaminophen  650 mg Oral Q6H  . aspirin  81 mg Per Tube Daily  . atorvastatin  40 mg Per Tube Daily  . carvedilol  3.125 mg Oral BID WC  . chlorhexidine  15 mL Mouth Rinse BID  . Chlorhexidine Gluconate Cloth  6 each Topical Daily  . collagenase   Topical Daily  . docusate  50 mg Per Tube Daily  . DULoxetine  30 mg Oral Daily  . enoxaparin (LOVENOX) injection  120 mg Subcutaneous Q12H  . feeding supplement (PROSource TF)  90 mL Per Tube QID  . free water  250 mL Per Tube Q4H  . insulin aspart  0-20 Units Subcutaneous Q4H  . insulin aspart  8 Units Subcutaneous Q4H  . insulin detemir  35 Units Subcutaneous BID  . mouth rinse  15 mL Mouth Rinse q12n4p  . metroNIDAZOLE  500 mg Oral Q12H  . nutrition supplement (JUVEN)  1 packet Per Tube BID BM  . oxyCODONE  10 mg Per Tube Q6H  . polyethylene glycol  17 g Per Tube Daily  . QUEtiapine  25 mg Oral QHS  . sodium chloride flush  10-40 mL Intracatheter Q12H  . vitamin B-12  100 mcg Per Tube Daily   Continuous Infusions: . sodium chloride Stopped (12/31/20 1020)  .  ceFAZolin (ANCEF) IV 2 g (01/09/21 0556)  . DAPTOmycin (CUBICIN)  IV 900 mg (01/08/21 2213)  .  feeding supplement (OSMOLITE 1.2 CAL) 1,000 mL (01/08/21 0010)     LOS: 30 days    Time spent: 54 minutes spent on chart review, discussion with nursing staff, consultants, updating family and interview/physical exam; more than 50% of that time was spent in counseling and/or coordination of care.    Zully Frane J [place] DO [facility] Available via Epic secure chat 7am-7pm After these hours, please refer to coverage provider listed on [email or web address] 01/09/2021, 11:23 AM

## 2021-01-10 LAB — BASIC METABOLIC PANEL
Anion gap: 9 (ref 5–15)
BUN: 38 mg/dL — ABNORMAL HIGH (ref 6–20)
CO2: 27 mmol/L (ref 22–32)
Calcium: 8.6 mg/dL — ABNORMAL LOW (ref 8.9–10.3)
Chloride: 107 mmol/L (ref 98–111)
Creatinine, Ser: 0.82 mg/dL (ref 0.61–1.24)
GFR, Estimated: >60 mL/min (ref >60)
Glucose, Bld: 153 mg/dL — ABNORMAL HIGH (ref 70–99)
Potassium: 4 mmol/L (ref 3.5–5.1)
Sodium: 143 mmol/L (ref 135–145)

## 2021-01-10 LAB — CBC
HCT: 28.7 % — ABNORMAL LOW (ref 39.0–52.0)
Hemoglobin: 9 g/dL — ABNORMAL LOW (ref 13.0–17.0)
MCH: 31.1 pg (ref 26.0–34.0)
MCHC: 31.4 g/dL (ref 30.0–36.0)
MCV: 99.3 fL (ref 80.0–100.0)
Platelets: 218 10*3/uL (ref 150–400)
RBC: 2.89 MIL/uL — ABNORMAL LOW (ref 4.22–5.81)
RDW: 14.3 % (ref 11.5–15.5)
WBC: 9.1 10*3/uL (ref 4.0–10.5)
nRBC: 0 % (ref 0.0–0.2)

## 2021-01-10 LAB — GLUCOSE, CAPILLARY
Glucose-Capillary: 87 mg/dL (ref 70–99)
Glucose-Capillary: 145 mg/dL — ABNORMAL HIGH (ref 70–99)
Glucose-Capillary: 156 mg/dL — ABNORMAL HIGH (ref 70–99)
Glucose-Capillary: 160 mg/dL — ABNORMAL HIGH (ref 70–99)
Glucose-Capillary: 159 mg/dL — ABNORMAL HIGH (ref 70–99)
Glucose-Capillary: 128 mg/dL — ABNORMAL HIGH (ref 70–99)
Glucose-Capillary: 154 mg/dL — ABNORMAL HIGH (ref 70–99)

## 2021-01-10 LAB — CULTURE, BLOOD (ROUTINE X 2)
Culture: NO GROWTH
Culture: NO GROWTH
Special Requests: ADEQUATE
Special Requests: ADEQUATE

## 2021-01-10 NOTE — Plan of Care (Signed)
  Problem: Education: Goal: Knowledge of General Education information will improve Description Including pain rating scale, medication(s)/side effects and non-pharmacologic comfort measures Outcome: Progressing   

## 2021-01-10 NOTE — Progress Notes (Signed)
  Speech Language Pathology Treatment: Dysphagia  Patient Details Name: [name] MRN: [id number] DOB: 1961/05/13 Today's Date: 01/10/2021 Time: 6387-5643 SLP Time Calculation (min) (ACUTE ONLY): 19.6 min  Assessment / Plan / Recommendation Clinical Impression  Pt was seen for dysphagia treatment following new order questioning whether pt will need a PEG. Pt was alert and cooperative during the session with c/o back pain, but he was able to be repositioned. Pt tolerated puree solids, regular texture solids, nectar thick liquids via straw using individual and consecutive swallows, and >50% of thin liquids via cup without overt s/sx of aspiration. Pt inconsistently demonstrated coughing with thin liquids which appeared to be related to bolus size. Mastication was prolonged with regular texture solids, and a liquid wash assisted oral clearance. Pt's diet will be advanced to dysphagia 2 solids and nectar thick liquids at this time. SLP anticipates that a G-tube would not be needed from an oropharyngeal swallow standpoint if pt at least maintains his current level of performance. Pt's nurse indicated that enteral feeds are currently continues and SLP questions whether it would be beneficial to switch to nocturnal feeds or discontinue enteral feeds to be able to more accurately assess adequacy of p.o. intake. Pt's performance and SLP's considerations were discussed with Dr. [place]. SLP will continue to follow pt.     HPI HPI: Pt is a 60 year old male who presented 3/31 to the ED with 4-5 day hx of diarrhea and vomiting. He developed confusion and became belligerent at home. Initial ED evaluation found him to be hypotensive, combative, confused, and he was intubated.  He was noted to have mottling over his trunk and extremities and was  admitted for hypoxemic respiratory failure and septic shock 2/2 E. Coli bacteremia and UTI, and ATN. ETT 3/31-4/5; reintubated 4/5-4/16. 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 CXR 4/14: Worsening pneumonia in both lungs, most confluent at the RIGHT  lung base. CXR 4/22: Mild residual airspace disease in the right mid to lower lung.  Aeration slightly improved. No new airspace opacity      SLP Plan  Continue with current plan of care       Recommendations  Diet recommendations: Dysphagia 2 (fine chop);Nectar-thick liquid Liquids provided via: Teaspoon;Cup;Straw Medication Administration: Whole meds with puree Supervision: Staff to assist with self feeding;Full supervision/cueing for compensatory strategies Compensations: Slow rate;Small sips/bites;Follow solids with liquid;Monitor for anterior loss Postural Changes and/or Swallow Maneuvers: Seated upright 90 degrees;Upright 30-60 min after meal                Oral Care Recommendations: Staff/trained caregiver to provide oral care;Oral care BID Follow up Recommendations: LTACH SLP Visit Diagnosis: Dysphagia, oropharyngeal phase (R13.12) Plan: Continue with current plan of care       [name], MS, CCC-SLP Acute Rehabilitation Services Office number [phone] Pager [phone]                Scheryl Marten 01/10/2021, 4:19 PM

## 2021-01-10 NOTE — Progress Notes (Signed)
PROGRESS NOTE    [name]  WUX:[id number] DOB: [birth year] DOA: 12/10/2020 PCP: Merryl Hacker, No    Brief Narrative:  [name] is a 60 year old male with past medical history significant for essential hypertension, hyperlipidemia who presented to [facility] ED on 3/31 for a 5-day history of nausea/vomiting/diarrhea and confusion.  Patient was initially admitted to [facility] service for acute hypoxic respiratory failure requiring intubation and ventilatory support with septic shock secondary to E. coli septicemia, UTI, MSSA Pneumonia, MRSE bacteremia (CLABSI involving left IJ vein and subclavian vein), with possible endovascular infection.  Infectious disease followed during hospital course.  Patient transferred from [facility] to [facility] on 01/01/2021 for further management of ongoing metabolic encephalopathy, tachycardia, dysphagia requiring tube feeds, intermittent fevers and prolonged antibiotic course.   Assessment & Plan:   Active Problems:   Acute respiratory failure (HCC)   AKI (acute kidney injury) ([place])   Bacteremia due to Escherichia coli   Ureteral stone   Pyelonephritis   Cerebral thrombosis with cerebral infarction   Staphylococcus epidermidis bacteremia   Pressure injury of skin   Acute hypoxic respiratory failure: Resolved MSSA pneumonia On initial presentation to ED, patient was noted to be hypoxic and was subsequently intubated and required vasopressors managed by pulmonary critical care service.  Patient was found to have MSSA pneumonia per respiratory culture 4/10.  Patient successfully extubated on 4/16.  Vasopressors were weaned off.  PCCM now signed off and transferred to [facility] on 4/22. --Supplemental oxygen now weaned off, on room air with SPO2 96% --Nebs as needed  MRSE endovascular infection/septicemia E. coli UTI/septicemia Septic shock, POA Blood cultures and urine culture 3/31 positive for E. coli.  Blood culture 4/15 positive for Staph epidermidis.  Blood culture 4/23  positive for Staph epidermidis.  Blood culture 4/24 positive for E. Coli.  He 12/11/2020 with LVEF 40-45%, LV global hypokinesis, grade 2 diastolic dysfunction, mild dilation aortic root 41 mm, RV systolic function normal.  EEG 4/15 with no evidence of valvular vegetations but poor/challenging study.  Limited TTE 4/25 with improved LVEF to 60-65%. --Infectious disease following, appreciate assistance --Blood cultures x2 4/26: No growth x4 days --Urine culture 4/26 no growth --Daptomycin 900 mg IV daily x 6 weeks from 4/24 (~02/14/21) --Cefazolin 2 g IV every 8 hours x 10 days (End 01/13/21)  Right buttocks deep tissue injury Pressure Injury 12/21/20 Buttocks Right Deep Tissue Pressure Injury - Purple or maroon localized area of discolored intact skin or blood-filled blister due to damage of underlying soft tissue from pressure and/or shear. purple with blister area that has  (Active)  12/21/20 1000  Location: Buttocks  Location Orientation: Right  Staging: Deep Tissue Pressure Injury - Purple or maroon localized area of discolored intact skin or blood-filled blister due to damage of underlying soft tissue from pressure and/or shear.  Wound Description (Comments): purple with blister area that has torn  Present on Admission: No     Pressure Injury 12/21/20 Foot Left;Lateral Deep Tissue Pressure Injury - Purple or maroon localized area of discolored intact skin or blood-filled blister due to damage of underlying soft tissue from pressure and/or shear. .5 cm purple area (Active)  12/21/20 0800  Location: Foot  Location Orientation: Left;Lateral  Staging: Deep Tissue Pressure Injury - Purple or maroon localized area of discolored intact skin or blood-filled blister due to damage of underlying soft tissue from pressure and/or shear.  Wound Description (Comments): .5 cm purple area  Present on Admission: No     Pressure Injury 01/04/21  Buttocks Posterior;Right;Mid Stage 3 -  Full thickness tissue loss.  Subcutaneous fat may be visible but bone, tendon or muscle are NOT exposed. medial buttocks where rectal tube laid. (tube removed) (Active)  01/04/21 0800  Location: Buttocks  Location Orientation: Posterior;Right;Mid  Staging: Stage 3 -  Full thickness tissue loss. Subcutaneous fat may be visible but bone, tendon or muscle are NOT exposed.  Wound Description (Comments): medial buttocks where rectal tube laid. (tube removed)  Present on Admission: No  --Continue antibiotics as above, Flagyl 500 mg per tube q12h. --Seen by general surgery 4/28, no need for debridement --Wound care following --continue hydrotherapy  Essential hypertension BP 136/67 this morning, well controlled. -- Carvedilol 3.125 mg twice daily -- Continue aspirin and statin  HLD: Atorvastatin 40 mg daily  NASH cirrhosis: Outpatient follow-up with gastroenterology  Mild hyponatremia Sodium 143 this am.  Free water flushes via tube feeds increased recently.   --BMP daily.  Hypokalemia: Resolved Repleted.  Potassium 4.0 today.  On tube feeds  Dysphagia Patient continues with dysphagia, poor oral intake likely secondary from critical illness as above and subacute stroke.  Cleared by speech therapy for dysphagia 1 diet, per nursing patient with variable oral intake.  Continues on tube feeds via core track tube. Need determination for possible PEG placement. -- We will have SLP reevaluate to see if can come off of tube feeds -- May need to consider nutrition consult for calorie count if able to stop tube feeds  Acute renal failure: Resolved Etiology likely secondary to septic shock, ATN, pyelonephritis, with findings of hydronephrosis. Underwent left ureteral stent 4/6.  Repeat ultrasound with mild left hydronephrosis.  CT abdomen with stable position of double-J stent. --Cr5.68>5.50>>>0.82 this morning, stable  Acute toxic metabolic encephalopathy with ongoing delirium Subacute versus chronic infarct with left upper  extremity weakness Dysphagia MR brain with 9 mm of late subacute versus chronic ischemic infarct involving posterior right frontal centrum semiovale, MRA head/neck unrevealing without significant stenosis. Seen by neurology.  --continue tube feeds per nutrition via cortrack tube --continue SLP efforts --continue asa and statin --needs LTACH vs SNF placement  Type 2 diabetes mellitus On glipizide at home.  Hemoglobin A1c 6.8. --Lantus 35 u BID --Novolog 8 u Tok q4h  Left IJ/subclavian DVT versus septic thrombus Left IJ central line was removed in ICU, PICC line removed 4/24 to right upper extremity.  IV appears resolved on repeat ultrasound duplex scan 4/25. --Continue Lovenox therapeutic dose  Chronic microcytic anemia: Hemoglobin stable, 9.0.  Prolonged QTC --Avoid QT prolonging medications --Goal magnesium > 2 and potassium > 4  Morbid obesity Body mass index is 37.36 kg/m.  Would benefit from weight loss as this complicates all facets of care.  Weakness/deconditioning/debility: --Continue PT/OT efforts --Recommendations of LTAC placement  Goals of care: Patient with complicated and prolonged hospitalization with ongoing bacteremia from multiple organisms, underlying cirrhosis, CVA and ongoing encephalopathy.  Prognosis remains guarded.  Palliative care has been consulted.  Patient remains full code.  Prognosis has been relayed to his spouse on multiple occasions given his overall poor clinical condition.    DVT prophylaxis: Lovenox   Code Status: Full Code Family Communication: No family present at bedside this morning.  Disposition Plan:  Level of care: Progressive Status is: Inpatient  Remains inpatient appropriate because:Unsafe d/c plan, IV treatments appropriate due to intensity of illness or inability to take PO and Inpatient level of care appropriate due to severity of illness   Dispo: The patient is from: Home  Anticipated d/c is to: LTAC               Patient currently is not medically stable to d/c.   Difficult to place patient No   Consultants:   PCCM  Urology Dr. Claudia Desanctis, Dr. Matilde Sprang  Nephrology, Dr. Jonnie Finner, Dr. Augustin Coupe  Neurology, Dr. Leonie Man  Infectious disease  Procedures/significant Hospital events:   Intubation 3/31  Triple-lumen central line placement 3/31  4/1 off pressors. E coli bacteremia, e coli uti. Some improvement in pulm opacities on CXR. On rocephin. TTE with LVEF 40-45%, LV global hypokinesis, G2DD, normal RV, dilated aortic root at 14m.  Bronchoscopy with BAL 4/5, Dr. STamala Julian 4/5 extubated, failed immediately due to mucous plugging identified on bronch after re-intubated. CT ABD with 466mleft ureteral stone with mild edema of left kideny, large areas of consolidation in bilateral lung bases.   4/6 urology consulted for distal left ureteral stone, to OR overnight for stent placement, UCx sent.  Versed added for agitation (on fent / precedex)  4/7 Off vasopressors  4/11 Vasopressors restarted   4/13: Left IJ holiday  4/15: Right IJ placed   4/16: extubated, remained agitated and restless requiring IV Precedex infusion  4/18: Cortrak tube insertion   Antimicrobials:   Daptomycin 4/26>> (End 6/5)  Cefazolin 4/4 - 4/6, 4/28>> (End 5/4)  Metronidazole 4/27>> (End 5/4)  Cefepime 4/6 - 4/7  Ceftriaxone 3/31 - 3/31, 4/7 - 4/11, 4/14 - 4/15  Vancomycin 3/31 - 3/31, 4/14 - 4/24, 4/26 - 4/28  Zosyn 3/31 - 3/31    Subjective: Patient seen and examined bedside, pleasantly confused.  Continues with right wrist restraint in place due to pulling at core track tube.  Patient requesting water.  When discussed with him regarding his dysphagia, "I do not have any problems swallowing".  No family present.  No specific complaints this morning other than wanting his wrist restraints removed.  Denies chest pain, no shortness of breath, no abdominal pain.  No acute concerns overnight per nursing  staff.   Objective: Vitals:   01/09/21 1232 01/10/21 0100 01/10/21 0425 01/10/21 0736  BP: (!) 148/74 (!) 160/84 (!) 147/81 (!) 144/75  Pulse: (!) 101 (!) 107 (!) 103 (!) 104  Resp: _0 Temp: 98.6 F (37 C) (P) 98.6 F (37 C) 98.9 F (37.2 C)   TempSrc: Oral  Oral Oral  SpO2:  95% 93% 98%  Weight:      Height:        Intake/Output Summary (Last 24 hours) at 01/10/2021 0925 Last data filed at 01/10/2021 0425 Gross per 24 hour  Intake 4751.39 ml  Output 3000 ml  Net 1751.39 ml   Filed Weights   12/29/20 0400 01/03/21 0500 01/09/21 0500  Weight: 126.7 kg 120.6 kg 132 kg    Examination:  General exam: Appears calm and comfortable, pleasantly confused, right wrist on restraint, chronically ill in appearance Respiratory system: Clear to auscultation. Respiratory effort normal.  On room air Cardiovascular system: S1 & S2 heard, RRR. No JVD, murmurs, rubs, gallops or clicks. No pedal edema. Gastrointestinal system: Abdomen is nondistended, soft and nontender. No organomegaly or masses felt. Normal bowel sounds heard.  Core track tube noted with tube feeds infusing Central nervous system: Alert, oriented to person ("idiot"), [facility] [place] but not situation.  Left upper extremity weakness noted, otherwise no focal neurological deficits. Extremities: Moving extremities independently, left-sided weakness noted to upper extremity Skin: Right buttock pressure  wound noted as depicted below Psychiatry: Judgement and insight appear poor. Mood & affect appropriate.    01/08/21   Data Reviewed: I have personally reviewed following labs and imaging studies  CBC: Recent Labs  Lab 01/05/21 0800 01/06/21 0447 01/07/21 0301 01/08/21 0039 01/09/21 0152 01/10/21 0250  WBC 11.7* 13.9* 9.7 9.4 8.6 9.1  NEUTROABS 9.6*  --   --   --   --   --   HGB 9.7* 9.6* 9.0* 8.5* 9.0* 9.0*  HCT 30.5* 31.2* 29.0* 26.9* 28.1* 28.7*  MCV 98.4 101.3* 100.7* 99.3  98.3 99.3  PLT 186 201 188 193 220 468   Basic Metabolic Panel: Recent Labs  Lab 01/06/21 0447 01/07/21 0301 01/08/21 0039 01/09/21 0152 01/10/21 0250  NA 146* 146* 144 146* 143  K 3.8 3.5 3.3* 3.8 4.0  CL 112* 108 111 111 107  CO2 _0 GLUCOSE 182* 131* 170* 134* 153*  BUN 52* 54* 53* 44* 38*  CREATININE 1.00 0.94 0.88 0.89 0.82  CALCIUM 8.8* 8.8* 8.6* 8.6* 8.6*   GFR: Estimated Creatinine Clearance: 141.8 mL/min (by C-G formula based on SCr of 0.82 mg/dL). Liver Function Tests: No results for input(s): AST, ALT, ALKPHOS, BILITOT, PROT, ALBUMIN in the last 168 hours. No results for input(s): LIPASE, AMYLASE in the last 168 hours. Recent Labs  Lab 01/04/21 1050  AMMONIA 34   Coagulation Profile: No results for input(s): INR, PROTIME in the last 168 hours. Cardiac Enzymes: Recent Labs  Lab 01/06/21 0447  CKTOTAL 129   BNP (last 3 results) No results for input(s): PROBNP in the last 8760 hours. HbA1C: No results for input(s): HGBA1C in the last 72 hours. CBG: Recent Labs  Lab 01/09/21 1621 01/09/21 2131 01/10/21 0121 01/10/21 0413 01/10/21 0735  GLUCAP 194* 123* 87 145* 156*   Lipid Profile: No results for input(s): CHOL, HDL, LDLCALC, TRIG, CHOLHDL, LDLDIRECT in the last 72 hours. Thyroid Function Tests: No results for input(s): TSH, T4TOTAL, FREET4, T3FREE, THYROIDAB in the last 72 hours. Anemia Panel: No results for input(s): VITAMINB12, FOLATE, FERRITIN, TIBC, IRON, RETICCTPCT in the last 72 hours. Sepsis Labs: No results for input(s): PROCALCITON, LATICACIDVEN in the last 168 hours.  Recent Results (from the past 240 hour(s))  Culture, Urine     Status: None   Collection Time: 01/02/21  4:06 AM   Specimen: Urine, Random  Result Value Ref Range Status   Specimen Description URINE, RANDOM  Final   Special Requests NONE  Final   Culture   Final    NO GROWTH Performed at [facility], [address], [place], [place] [address]     Report Status 01/03/2021 FINAL  Final  Culture, blood (routine x 2)     Status: Abnormal   Collection Time: 01/02/21  4:19 AM   Specimen: BLOOD LEFT HAND  Result Value Ref Range Status   Specimen Description BLOOD LEFT HAND  Final   Special Requests AEROBIC BOTTLE ONLY Blood Culture adequate volume  Final   Culture  Setup Time   Final    GRAM POSITIVE COCCI IN CLUSTERS AEROBIC BOTTLE ONLY CRITICAL VALUE NOTED.  VALUE IS CONSISTENT WITH PREVIOUSLY REPORTED AND CALLED VALUE.    Culture (A)  Final    STAPHYLOCOCCUS EPIDERMIDIS SUSCEPTIBILITIES PERFORMED ON PREVIOUS CULTURE WITHIN THE LAST 5 DAYS. Performed at [facility], [place] [address] [place], [place] [address]    Report Status 01/05/2021 FINAL  Final  Culture, blood (routine  x 2)     Status: Abnormal   Collection Time: 01/02/21  4:32 AM   Specimen: BLOOD LEFT HAND  Result Value Ref Range Status   Specimen Description BLOOD LEFT HAND  Final   Special Requests   Final    BOTTLES DRAWN AEROBIC AND ANAEROBIC Blood Culture results may not be optimal due to an inadequate volume of blood received in culture bottles   Culture  Setup Time   Final    GRAM POSITIVE COCCI ANAEROBIC BOTTLE ONLY CRITICAL RESULT CALLED TO, READ BACK BY AND VERIFIED WITH: PHARMD GREG ABBOTT 01/03/2021 AT 0630 A.HUGHES Performed at [facility], [place] [address] [place], [place] [address]    Culture STAPHYLOCOCCUS EPIDERMIDIS (A)  Final   Report Status 01/05/2021 FINAL  Final   Organism ID, Bacteria STAPHYLOCOCCUS EPIDERMIDIS  Final      Susceptibility   Staphylococcus epidermidis - MIC*    CIPROFLOXACIN >=8 RESISTANT Resistant     ERYTHROMYCIN >=8 RESISTANT Resistant     GENTAMICIN >=16 RESISTANT Resistant     OXACILLIN >=4 RESISTANT Resistant     TETRACYCLINE 2 SENSITIVE Sensitive     VANCOMYCIN 1 SENSITIVE Sensitive     TRIMETH/SULFA 80 RESISTANT Resistant     CLINDAMYCIN >=8 RESISTANT Resistant     RIFAMPIN <=0.5 SENSITIVE Sensitive      Inducible Clindamycin NEGATIVE Sensitive     * STAPHYLOCOCCUS EPIDERMIDIS  Blood Culture ID Panel (Reflexed)     Status: Abnormal   Collection Time: 01/02/21  4:32 AM  Result Value Ref Range Status   Enterococcus faecalis NOT DETECTED NOT DETECTED Final   Enterococcus Faecium NOT DETECTED NOT DETECTED Final   Listeria monocytogenes NOT DETECTED NOT DETECTED Final   Staphylococcus species DETECTED (A) NOT DETECTED Final    Comment: CRITICAL RESULT CALLED TO, READ BACK BY AND VERIFIED WITH: PHARMD GREG ABBOTT 01/03/2021 AT 0630 A.HUGHES    Staphylococcus aureus (BCID) NOT DETECTED NOT DETECTED Final   Staphylococcus epidermidis DETECTED (A) NOT DETECTED Final    Comment: Methicillin (oxacillin) resistant coagulase negative staphylococcus. Possible blood culture contaminant (unless isolated from more than one blood culture draw or clinical case suggests pathogenicity). No antibiotic treatment is indicated for blood  culture contaminants. CRITICAL RESULT CALLED TO, READ BACK BY AND VERIFIED WITH: PHARMD GREG ABBOTT 01/03/2021 AT 0630 A.HUGHES    Staphylococcus lugdunensis NOT DETECTED NOT DETECTED Final   Streptococcus species NOT DETECTED NOT DETECTED Final   Streptococcus agalactiae NOT DETECTED NOT DETECTED Final   Streptococcus pneumoniae NOT DETECTED NOT DETECTED Final   Streptococcus pyogenes NOT DETECTED NOT DETECTED Final   A.calcoaceticus-baumannii NOT DETECTED NOT DETECTED Final   Bacteroides fragilis NOT DETECTED NOT DETECTED Final   Enterobacterales NOT DETECTED NOT DETECTED Final   Enterobacter cloacae complex NOT DETECTED NOT DETECTED Final   Escherichia coli NOT DETECTED NOT DETECTED Final   Klebsiella aerogenes NOT DETECTED NOT DETECTED Final   Klebsiella oxytoca NOT DETECTED NOT DETECTED Final   Klebsiella pneumoniae NOT DETECTED NOT DETECTED Final   Proteus species NOT DETECTED NOT DETECTED Final   Salmonella species NOT DETECTED NOT DETECTED Final   Serratia  marcescens NOT DETECTED NOT DETECTED Final   Haemophilus influenzae NOT DETECTED NOT DETECTED Final   Neisseria meningitidis NOT DETECTED NOT DETECTED Final   Pseudomonas aeruginosa NOT DETECTED NOT DETECTED Final   Stenotrophomonas maltophilia NOT DETECTED NOT DETECTED Final   Candida albicans NOT DETECTED NOT DETECTED Final   Candida auris NOT DETECTED NOT DETECTED Final  Candida glabrata NOT DETECTED NOT DETECTED Final   Candida krusei NOT DETECTED NOT DETECTED Final   Candida parapsilosis NOT DETECTED NOT DETECTED Final   Candida tropicalis NOT DETECTED NOT DETECTED Final   Cryptococcus neoformans/gattii NOT DETECTED NOT DETECTED Final   Methicillin resistance mecA/C DETECTED (A) NOT DETECTED Final    Comment: CRITICAL RESULT CALLED TO, READ BACK BY AND VERIFIED WITH: PHARMD GREG ABBOTT 01/03/2021 AT 0630 A.HUGHES Performed at [facility], [place] [address], [place], [place] [address]   Culture, blood (Routine X 2) w Reflex to ID Panel     Status: Abnormal   Collection Time: 01/03/21 11:31 AM   Specimen: BLOOD LEFT HAND  Result Value Ref Range Status   Specimen Description BLOOD LEFT HAND  Final   Special Requests   Final    BOTTLES DRAWN AEROBIC ONLY Blood Culture adequate volume   Culture  Setup Time   Final    GRAM NEGATIVE RODS AEROBIC BOTTLE ONLY PHARMD GREG ABBOTT 01/04/2021 AT 0425 A.HUGHES CRITICAL VALUE NOTED.  VALUE IS CONSISTENT WITH PREVIOUSLY REPORTED AND CALLED VALUE.    Culture (A)  Final    ESCHERICHIA COLI SUSCEPTIBILITIES PERFORMED ON PREVIOUS CULTURE WITHIN THE LAST 5 DAYS. Performed at [facility], [place] [address] [place], [place] [address]    Report Status 01/06/2021 FINAL  Final  Culture, blood (Routine X 2) w Reflex to ID Panel     Status: Abnormal   Collection Time: 01/03/21 11:31 AM   Specimen: BLOOD LEFT FOREARM  Result Value Ref Range Status   Specimen Description BLOOD LEFT FOREARM  Final   Special Requests   Final    BOTTLES DRAWN  AEROBIC ONLY Blood Culture adequate volume   Culture  Setup Time   Final    GRAM NEGATIVE RODS AEROBIC BOTTLE ONLY CRITICAL RESULT CALLED TO, READ BACK BY AND VERIFIED WITH: PHARMD GREG ABBOTT 01/04/2021 AT 0425 A.HUGHES Performed at [facility], [place] [address] [place], [place] [address]    Culture ESCHERICHIA COLI (A)  Final   Report Status 01/06/2021 FINAL  Final   Organism ID, Bacteria ESCHERICHIA COLI  Final      Susceptibility   Escherichia coli - MIC*    AMPICILLIN 8 SENSITIVE Sensitive     CEFAZOLIN <=4 SENSITIVE Sensitive     CEFEPIME <=0.12 SENSITIVE Sensitive     CEFTAZIDIME <=1 SENSITIVE Sensitive     CEFTRIAXONE <=0.25 SENSITIVE Sensitive     CIPROFLOXACIN <=0.25 SENSITIVE Sensitive     GENTAMICIN <=1 SENSITIVE Sensitive     IMIPENEM <=0.25 SENSITIVE Sensitive     TRIMETH/SULFA <=20 SENSITIVE Sensitive     AMPICILLIN/SULBACTAM <=2 SENSITIVE Sensitive     PIP/TAZO <=4 SENSITIVE Sensitive     * ESCHERICHIA COLI  Blood Culture ID Panel (Reflexed)     Status: Abnormal   Collection Time: 01/03/21 11:31 AM  Result Value Ref Range Status   Enterococcus faecalis NOT DETECTED NOT DETECTED Final   Enterococcus Faecium NOT DETECTED NOT DETECTED Final   Listeria monocytogenes NOT DETECTED NOT DETECTED Final   Staphylococcus species NOT DETECTED NOT DETECTED Final   Staphylococcus aureus (BCID) NOT DETECTED NOT DETECTED Final   Staphylococcus epidermidis NOT DETECTED NOT DETECTED Final   Staphylococcus lugdunensis NOT DETECTED NOT DETECTED Final   Streptococcus species NOT DETECTED NOT DETECTED Final   Streptococcus agalactiae NOT DETECTED NOT DETECTED Final   Streptococcus pneumoniae NOT DETECTED NOT DETECTED Final   Streptococcus pyogenes NOT DETECTED NOT  DETECTED Final   A.calcoaceticus-baumannii NOT DETECTED NOT DETECTED Final   Bacteroides fragilis NOT DETECTED NOT DETECTED Final   Enterobacterales DETECTED (A) NOT DETECTED Final    Comment: Enterobacterales  represent a large order of gram negative bacteria, not a single organism. CRITICAL RESULT CALLED TO, READ BACK BY AND VERIFIED WITH: PHARMD GREG ABBOTT 01/04/2021 AT 0425 A.HUGHES    Enterobacter cloacae complex NOT DETECTED NOT DETECTED Final   Escherichia coli DETECTED (A) NOT DETECTED Final    Comment: CRITICAL RESULT CALLED TO, READ BACK BY AND VERIFIED WITH: PHARMD GREG ABBOTT 01/04/2021 AT 0425 A.HUGHES    Klebsiella aerogenes NOT DETECTED NOT DETECTED Final   Klebsiella oxytoca NOT DETECTED NOT DETECTED Final   Klebsiella pneumoniae NOT DETECTED NOT DETECTED Final   Proteus species NOT DETECTED NOT DETECTED Final   Salmonella species NOT DETECTED NOT DETECTED Final   Serratia marcescens NOT DETECTED NOT DETECTED Final   Haemophilus influenzae NOT DETECTED NOT DETECTED Final   Neisseria meningitidis NOT DETECTED NOT DETECTED Final   Pseudomonas aeruginosa NOT DETECTED NOT DETECTED Final   Stenotrophomonas maltophilia NOT DETECTED NOT DETECTED Final   Candida albicans NOT DETECTED NOT DETECTED Final   Candida auris NOT DETECTED NOT DETECTED Final   Candida glabrata NOT DETECTED NOT DETECTED Final   Candida krusei NOT DETECTED NOT DETECTED Final   Candida parapsilosis NOT DETECTED NOT DETECTED Final   Candida tropicalis NOT DETECTED NOT DETECTED Final   Cryptococcus neoformans/gattii NOT DETECTED NOT DETECTED Final   CTX-M ESBL NOT DETECTED NOT DETECTED Final   Carbapenem resistance IMP NOT DETECTED NOT DETECTED Final   Carbapenem resistance KPC NOT DETECTED NOT DETECTED Final   Carbapenem resistance NDM NOT DETECTED NOT DETECTED Final   Carbapenem resist OXA 48 LIKE NOT DETECTED NOT DETECTED Final   Carbapenem resistance VIM NOT DETECTED NOT DETECTED Final    Comment: Performed at [facility], [address] [place], [place] [address]  Culture, blood (routine x 2)     Status: None (Preliminary result)   Collection Time: 01/05/21  6:10 AM   Specimen: BLOOD RIGHT HAND   Result Value Ref Range Status   Specimen Description BLOOD RIGHT HAND  Final   Special Requests   Final    BOTTLES DRAWN AEROBIC ONLY Blood Culture adequate volume   Culture   Final    NO GROWTH 4 DAYS Performed at [facility], [place] [address] [place], [place] [address]    Report Status PENDING  Incomplete  Culture, blood (routine x 2)     Status: None (Preliminary result)   Collection Time: 01/05/21  6:11 AM   Specimen: BLOOD RIGHT HAND  Result Value Ref Range Status   Specimen Description BLOOD RIGHT HAND  Final   Special Requests   Final    BOTTLES DRAWN AEROBIC ONLY Blood Culture adequate volume   Culture   Final    NO GROWTH 4 DAYS Performed at [facility], [place] [address], [place], [place] [address]    Report Status PENDING  Incomplete  Culture, Urine     Status: None   Collection Time: 01/05/21  7:38 AM   Specimen: Urine, Random  Result Value Ref Range Status   Specimen Description URINE, RANDOM  Final   Special Requests NONE  Final   Culture   Final    NO GROWTH Performed at [facility], [place] [address], [place],  [address]    Report Status 01/06/2021 FINAL  Final         Radiology Studies: No results found.      Scheduled Meds: . acetaminophen  650 mg Oral Q6H  . aspirin  81 mg Per Tube Daily  . atorvastatin  40 mg Per Tube Daily  . carvedilol  3.125 mg Oral BID WC  . chlorhexidine  15 mL Mouth Rinse BID  . Chlorhexidine Gluconate Cloth  6 each Topical Daily  . collagenase   Topical Daily  . docusate  50 mg Per Tube Daily  . DULoxetine  30 mg Oral Daily  . enoxaparin (LOVENOX) injection  120 mg Subcutaneous Q12H  . feeding supplement (PROSource TF)  90 mL Per Tube QID  . free water  250 mL Per Tube Q4H  . insulin aspart  0-20 Units Subcutaneous Q4H  . insulin aspart  8 Units Subcutaneous Q4H  . insulin detemir  35 Units Subcutaneous BID  . mouth rinse  15 mL Mouth Rinse q12n4p  . metroNIDAZOLE  500 mg Oral Q12H  .  nutrition supplement (JUVEN)  1 packet Per Tube BID BM  . oxyCODONE  10 mg Per Tube Q6H  . polyethylene glycol  17 g Per Tube Daily  . QUEtiapine  25 mg Oral QHS  . sodium chloride flush  10-40 mL Intracatheter Q12H  . vitamin B-12  100 mcg Per Tube Daily   Continuous Infusions: . sodium chloride Stopped (12/31/20 1020)  .  ceFAZolin (ANCEF) IV 2 g (01/10/21 0646)  . DAPTOmycin (CUBICIN)  IV 900 mg (01/09/21 2133)  . feeding supplement (OSMOLITE 1.2 CAL) 1,000 mL (01/09/21 1441)     LOS: 31 days    Time spent: 45 minutes spent on chart review, discussion with nursing staff, consultants, updating family and interview/physical exam; more than 50% of that time was spent in counseling and/or coordination of care.    Elyanna Wallick J [place] DO [facility] Available via Epic secure chat 7am-7pm After these hours, please refer to coverage provider listed on [email or web address] 01/10/2021, 9:25 AM

## 2021-01-11 DIAGNOSIS — N10 Acute tubulo-interstitial nephritis: Secondary | ICD-10-CM

## 2021-01-11 DIAGNOSIS — R1314 Dysphagia, pharyngoesophageal phase: Secondary | ICD-10-CM

## 2021-01-11 DIAGNOSIS — J9600 Acute respiratory failure, unspecified whether with hypoxia or hypercapnia: Secondary | ICD-10-CM

## 2021-01-11 DIAGNOSIS — R5381 Other malaise: Secondary | ICD-10-CM

## 2021-01-11 LAB — CBC
HCT: 27.4 % — ABNORMAL LOW (ref 39.0–52.0)
Hemoglobin: 8.6 g/dL — ABNORMAL LOW (ref 13.0–17.0)
MCH: 30.9 pg (ref 26.0–34.0)
MCHC: 31.4 g/dL (ref 30.0–36.0)
MCV: 98.6 fL (ref 80.0–100.0)
Platelets: 246 10*3/uL (ref 150–400)
RBC: 2.78 MIL/uL — ABNORMAL LOW (ref 4.22–5.81)
RDW: 14.2 % (ref 11.5–15.5)
WBC: 8.3 10*3/uL (ref 4.0–10.5)
nRBC: 0 % (ref 0.0–0.2)

## 2021-01-11 LAB — BASIC METABOLIC PANEL
Anion gap: 9 (ref 5–15)
BUN: 34 mg/dL — ABNORMAL HIGH (ref 6–20)
CO2: 25 mmol/L (ref 22–32)
Calcium: 8.7 mg/dL — ABNORMAL LOW (ref 8.9–10.3)
Chloride: 106 mmol/L (ref 98–111)
Creatinine, Ser: 0.77 mg/dL (ref 0.61–1.24)
GFR, Estimated: >60 mL/min (ref >60)
Glucose, Bld: 88 mg/dL (ref 70–99)
Potassium: 3.6 mmol/L (ref 3.5–5.1)
Sodium: 140 mmol/L (ref 135–145)

## 2021-01-11 LAB — GLUCOSE, CAPILLARY
Glucose-Capillary: 111 mg/dL — ABNORMAL HIGH (ref 70–99)
Glucose-Capillary: 141 mg/dL — ABNORMAL HIGH (ref 70–99)
Glucose-Capillary: 115 mg/dL — ABNORMAL HIGH (ref 70–99)
Glucose-Capillary: 86 mg/dL (ref 70–99)
Glucose-Capillary: 156 mg/dL — ABNORMAL HIGH (ref 70–99)
Glucose-Capillary: 134 mg/dL — ABNORMAL HIGH (ref 70–99)

## 2021-01-11 MED ORDER — DAKINS (1/4 STRENGTH) 0.125 % EX SOLN
Freq: Two times a day (BID) | CUTANEOUS | Status: AC
  Administered 2021-01-12: 1 via TOPICAL
  Filled 2021-01-11: qty 473

## 2021-01-11 MED ORDER — ZOLPIDEM TARTRATE 5 MG PO TABS
10.0000 mg | ORAL_TABLET | Freq: Every day | ORAL | Status: DC
  Administered 2021-01-11 – 2021-02-01 (×22): 10 mg via ORAL
  Filled 2021-01-11 (×22): qty 2

## 2021-01-11 MED ORDER — QUETIAPINE FUMARATE 50 MG PO TABS
50.0000 mg | ORAL_TABLET | Freq: Every day | ORAL | Status: DC
  Administered 2021-01-11 – 2021-02-01 (×22): 50 mg via ORAL
  Filled 2021-01-11 (×22): qty 1

## 2021-01-11 MED ORDER — OSMOLITE 1.2 CAL PO LIQD
1000.0000 mL | ORAL | Status: DC
  Administered 2021-01-11 – 2021-01-12 (×2): 1000 mL
  Filled 2021-01-11 (×3): qty 1000

## 2021-01-11 MED ORDER — COLLAGENASE 250 UNIT/GM EX OINT
TOPICAL_OINTMENT | Freq: Every day | CUTANEOUS | Status: DC
  Filled 2021-01-11 (×3): qty 30

## 2021-01-11 NOTE — [place] FL2 (Signed)
[place] MEDICAID FL2 LEVEL OF CARE SCREENING TOOL     IDENTIFICATION  Patient Name: [name] Birthdate: 09-07-1961 Sex: male Admission Date (Current Location): 12/10/2020  [facility] and [place] Number:  Producer, television/film/video and Address:  [facility] [place], [address] [place], [place] [address]      Provider Number: [id number]  Attending Physician Name and Address:  [place], Eric J, DO  Relative Name and Phone Number:       Current Level of Care: Hospital Recommended Level of Care: Skilled Nursing Facility Prior Approval Number:    Date Approved/Denied: 01/11/21 PASRR Number: [id number]  Discharge Plan: SNF    Current Diagnoses: Patient Active Problem List   Diagnosis Date Noted  . Pressure injury of skin 01/04/2021  . Staphylococcus epidermidis bacteremia 12/25/2020  . 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 AKI (acute kidney injury) (HCC)   . Bacteremia due to Escherichia coli   . Ureteral stone   . Pyelonephritis   . Acute respiratory failure (HCC) 12/10/2020  . Sepsis with acute renal failure and septic shock (HCC)     Orientation RESPIRATION BLADDER Height & Weight        Normal Incontinent,Indwelling catheter Weight: 291 lb 0.1 oz (132 kg) Height:  6\' 2"  (188 cm)  BEHAVIORAL SYMPTOMS/MOOD NEUROLOGICAL BOWEL NUTRITION STATUS      Incontinent Diet,Feeding tube (Currently with cortrak, nocturnal feeds, dysphasia 2 diet, calorie counts)  AMBULATORY STATUS COMMUNICATION OF NEEDS Skin   Extensive Assist Verbally PU Stage and Appropriate Care,Hydro Therapy     PU Stage 3 Dressing: Daily                 Personal Care Assistance Level of Assistance  Bathing,Dressing,Feeding Bathing Assistance: Maximum assistance Feeding assistance: Maximum assistance Dressing Assistance: Maximum assistance     Functional Limitations Info  Sight,Speech,Hearing Sight Info: Adequate Hearing Info: Adequate Speech Info:  Adequate    SPECIAL CARE FACTORS FREQUENCY  PT (By licensed PT),OT (By licensed OT)     PT Frequency: 5x weekly OT Frequency: 5x weekly            Contractures Contractures Info: Not present    Additional Factors Info  Allergies   Allergies Info: Ibuprofen, metformin           Current Medications (01/11/2021):  This is the current hospital active medication list Current Facility-Administered Medications  Medication Dose Route Frequency Provider Last Rate Last Admin  . 0.9 %  sodium chloride infusion   Intravenous PRN 03/13/2021 B, NP   Stopped at 12/31/20 1020  . acetaminophen (TYLENOL) 160 MG/5ML solution 500 mg  500 mg Per Tube Q6H PRN 01/02/21 B, NP   500 mg at 01/05/21 01/07/21  . acetaminophen (TYLENOL) tablet 650 mg  650 mg Oral Q6H Golding, Elizabeth L, DO   650 mg at 01/11/21 1152  . aspirin chewable tablet 81 mg  81 mg Per Tube Daily 03/13/21 B, NP   81 mg at 01/11/21 1008  . atorvastatin (LIPITOR) tablet 40 mg  40 mg Per Tube Daily 03/13/21 B, NP   40 mg at 01/11/21 1008  . carvedilol (COREG) tablet 3.125 mg  3.125 mg Oral BID WC Kc, Ramesh, MD   3.125 mg at 01/11/21 0813  . ceFAZolin (ANCEF) IVPB 2g/100 mL premix  2 g Intravenous Q8H 03/13/21, MD 200 mL/hr at 01/11/21 0542 2 g at 01/11/21 0542  . chlorhexidine (PERIDEX) 0.12 % solution 15  mL  15 mL Mouth Rinse BID Earney Hamburg B, NP   15 mL at 01/11/21 1008  . Chlorhexidine Gluconate Cloth 2 % PADS 6 each  6 each Topical Daily Earney Hamburg B, NP   6 each at 01/11/21 1008  . [START ON 01/15/2021] collagenase (SANTYL) ointment   Topical Daily [place], Alvira Philips, DO      . DAPTOmycin (CUBICIN) 900 mg in sodium chloride 0.9 % IVPB  9 mg/kg (Adjusted) Intravenous Q2000 Della Goo, [facility]   Stopped at 01/11/21 0813  . docusate (COLACE) 50 MG/5ML liquid 50 mg  50 mg Per Tube Daily Earney Hamburg B, NP   50 mg at 01/11/21 1008  . DULoxetine (CYMBALTA) DR capsule 30 mg  30 mg Oral Daily  Anderson Malta L, DO   30 mg at 01/11/21 1008  . enoxaparin (LOVENOX) injection 120 mg  120 mg Subcutaneous Q12H Hicks, Samantha B, NP   120 mg at 01/11/21 1009  . feeding supplement (OSMOLITE 1.2 CAL) liquid 1,000 mL  1,000 mL Per Tube Continuous Russella Dar, NP      . feeding supplement (PROSource TF) liquid 90 mL  90 mL Per Tube QID Earney Hamburg B, NP   90 mL at 01/11/21 1010  . free water 250 mL  250 mL Per Tube Q4H Kc, Ramesh, MD   250 mL at 01/11/21 1153  . hydrALAZINE (APRESOLINE) injection 10 mg  10 mg Intravenous Q4H PRN Earney Hamburg B, NP   10 mg at 12/31/20 0338  . HYDROmorphone (DILAUDID) injection 1 mg  1 mg Intravenous Q3H PRN Anderson Malta L, DO   1 mg at 01/09/21 2310  . insulin aspart (novoLOG) injection 0-20 Units  0-20 Units Subcutaneous Q4H Earney Hamburg B, NP   3 Units at 01/11/21 0816  . insulin aspart (novoLOG) injection 8 Units  8 Units Subcutaneous Q4H Shalhoub, Deno Lunger, MD   8 Units at 01/11/21 1153  . insulin detemir (LEVEMIR) injection 35 Units  35 Units Subcutaneous BID Earney Hamburg B, NP   35 Units at 01/11/21 1010  . ipratropium-albuterol (DUONEB) 0.5-2.5 (3) MG/3ML nebulizer solution 3 mL  3 mL Nebulization Q4H PRN Kc, Ramesh, MD      . MEDLINE mouth rinse  15 mL Mouth Rinse q12n4p Hicks, Samantha B, NP   15 mL at 01/11/21 1153  . metroNIDAZOLE (FLAGYL) tablet 500 mg  500 mg Oral Q12H Odette Fraction, MD   500 mg at 01/11/21 1009  . nutrition supplement (JUVEN) (JUVEN) powder packet 1 packet  1 packet Per Tube BID BM Earney Hamburg B, NP   1 packet at 01/11/21 1010  . ondansetron (ZOFRAN) injection 4 mg  4 mg Intravenous Q6H PRN Earney Hamburg B, NP   4 mg at 01/06/21 1012  . oxyCODONE (ROXICODONE) 5 MG/5ML solution 10 mg  10 mg Per Tube Q6H Golding, Elizabeth L, DO   10 mg at 01/11/21 1153  . polyethylene glycol (MIRALAX / GLYCOLAX) packet 17 g  17 g Per Tube Daily Earney Hamburg B, NP   17 g at 01/11/21 1009  . QUEtiapine (SEROQUEL)  tablet 25 mg  25 mg Oral QHS Lanae Boast, MD   25 mg at 01/10/21 2151  . Resource ThickenUp Clear   Oral PRN Earney Hamburg B, NP      . sodium chloride flush (NS) 0.9 % injection 10-40 mL  10-40 mL Intracatheter Q12H Hicks, Samantha B, NP   10 mL at 01/11/21 1011  . [  START ON 01/12/2021] sodium hypochlorite (DAKIN'S 1/4 STRENGTH) topical solution   Topical BID [place], Eric J, DO      . vitamin B-12 (CYANOCOBALAMIN) tablet 100 mcg  100 mcg Per Tube Daily Earney Hamburg B, NP   100 mcg at 01/11/21 1009     Discharge Medications: Please see discharge summary for a list of discharge medications.  Relevant Imaging Results:  Relevant Lab Results:   Additional Information SSN: [id number]  Inis Sizer, LCSW

## 2021-01-11 NOTE — Consult Note (Signed)
WOC Nurse wound follow up Patient receiving care in [facility] [id number]. Coordination of care completed with L. Kirkman, PT for wound care and hydrotherapy session for patient to right buttock wound. PT reporting increased blue drainage from wound. I have placed an order for twice daily 1/4% Dakin's solution and gauze to area for 3 days, then resumption of daily santyl and saline moistened gauze. Helmut Muster, RN, MSN, CWOCN, CNS-BC, pager [phone]

## 2021-01-11 NOTE — Progress Notes (Addendum)
Diagnosis: MRSE bacteremia and septic thrombophlebitis   Culture Result: MRSE   Allergies  Allergen Reactions  . Ibuprofen Other (See Comments)    Blood in the stool.  . Metformin Other (See Comments)    Tremors, muscles  locking up, unable to control extremities    OPAT Orders Discharge antibiotics to be given via PICC line Discharge antibiotics: Daptomycin  Per pharmacy protocol   Duration: 6 weeks End Date: 02/14/21   [facility] Care Per Protocol:  Home health RN for IV administration and teaching; PICC line care and labs.    Labs weekly while on IV antibiotics: X_ CBC with differential X__ BMP __ CMP __ CRP __ ESR __ Vancomycin trough X__ CK  X__ Please pull PIC at completion of IV antibiotics __ Please leave PIC in place until doctor has seen patient or been notified  Fax weekly labs to [phone]  Clinic Follow Up Appt: 5/13   @ 10: 30 am

## 2021-01-11 NOTE — Progress Notes (Addendum)
[facility] PROGRESS NOTE  [name] ION:[id number] DOB: 12-16-1960 DOA: 12/10/2020 [name]: [name], No  Status:  Remains inpatient appropriate because:Altered mental status, Unsafe d/c plan, IV treatments appropriate due to intensity of illness or inability to take PO and Inpatient level of care appropriate due to severity of illness   Dispo: The patient is from: Home              Anticipated d/c is to: SNF              Patient currently is not medically stable to d/c.   Difficult to place patient Yes                Level of care: Progressive  Code Status: Full Family Communication: Patient DVT prophylaxis: Lovenox Vaccination status: Unvaccinated-CM spoke with wife who states that for political reasons patient has refused COVID vaccination  Foley catheter: Foley catheter inserted on April 56  HPI: 60 year old male with past medical history significant for essential hypertension, hyperlipidemia who [facility] on 3/31 for a 5-day history of nausea/vomiting/diarrhea and confusion.  Patient was initially admitted to [facility] service for acute hypoxic respiratory failure requiring intubation and ventilatory support with septic shock secondary to E. coli septicemia, UTI, MSSA Pneumonia, MRSE bacteremia (CLABSI involving left IJ vein and subclavian vein), with possible endovascular infection.  Infectious disease followed during hospital course.  Patient transferred from [facility] to [facility] on 01/01/2021 for further management of ongoing metabolic encephalopathy, tachycardia, dysphagia requiring tube feeds, intermittent fevers and prolonged antibiotic course.  Procedures/significant Hospital events:   Intubation 3/31  Triple-lumen central line placement 3/31  4/1 off pressors. E coli bacteremia, e coli uti. Some improvement in pulm opacities on CXR. On rocephin. TTE with LVEF 40-45%, LV global hypokinesis, G2DD, normal RV, dilated aortic root at 57m.  Bronchoscopy with BAL  4/5, Dr. STamala Julian 4/5 extubated, failed immediately due to mucous plugging identified on bronch after re-intubated. CT ABD with 453mleft ureteral stone with mild edema of left kideny, large areas of consolidation in bilateral lung bases.   4/6 urology consulted for distal left ureteral stone, to OR overnight for stent placement, UCx sent. Versed added for agitation (on fent / precedex)  4/7 Off vasopressors  4/11 Vasopressors restarted   4/13: Left IJ holiday  4/15: Right IJ placed   4/16: extubated, remained agitated and restless requiring IV Precedex infusion  4/18: Cortrak tube insertion   Antimicrobials:   Daptomycin 4/26>> (End 6/5)  Cefazolin 4/4 - 4/6, 4/28>> (End 5/4)  Metronidazole 4/27>> (End 5/4)  Cefepime 4/6 - 4/7  Ceftriaxone 3/31 - 3/31, 4/7 - 4/11, 4/14 - 4/15  Vancomycin 3/31 - 3/31, 4/14 - 4/24, 4/26 - 4/28  Zosyn 3/31 - 3/31    Subjective: awake and very confused-believes that the physician previously told him he would be going home today and would only need to follow-up as an outpatient every couple weeks - when asking him about left arm weakness he agreed the arm was weak but was unaware that he had had a stroke  Objective: Vitals:   01/11/21 0811 01/11/21 1000  BP: (!) 121/59   Pulse: (!) 104 (!) 103  Resp: 20 (!) 23  Temp: 98.6 F (37 C)   SpO2: 97% 99%    Intake/Output Summary (Last 24 hours) at 01/11/2021 1054 Last data filed at 01/11/2021 1000 Gross per 24 hour  Intake 4728 ml  Output 1875 ml  Net 2853 ml   Filed  Weights   12/29/20 0400 01/03/21 0500 01/09/21 0500  Weight: 126.7 kg 120.6 kg 132 kg    Exam:  Constitutional: NAD, comfortable Respiratory: clear to auscultation bilaterally, no wheezing, no crackles. Normal respiratory effort.  Room air Cardiovascular: Regular rate and rhythm, no murmurs / rubs / gallops. No extremity edema.  Extremities warm to touch Abdomen: no tenderness, no masses palpated.Bowel sounds positive.  Cortrak tube in place LBM 5/2 Skin: Has stage III decubitus on hip-not visualized on 5/2 Neurologic: CN 2-12 grossly intact. Sensation intact, LUE weakness/hemiparesis; able to move legs although has generalized weakness and requires max assist with mobility Psychiatric: Awake and alert.  Very confused.  Pleasant overall affect.   Assessment/Plan: Acute problems: Acute hypoxic respiratory failure: Resolved MSSA pneumonia Presented with hypoxia that required intubation.  Also had sepsis physiology.  Eventually extubated and now on room air  MRSE endovascular infection/septicemia E. coli UTI/septicemia Septic shock, POA --Infectious disease following, appreciate assistance --Continue daptomycin 900 mg IV daily x 6 weeks from 4/24 (~02/14/21) -- Continue cefazolin 2 g IV every 8 hours x 10 days (End 01/13/21)  --5/2 most recent blood cultures negative  Acute toxic metabolic encephalopathy with ongoing delirium Subacute versus chronic infarct with left upper extremity weakness MR brain with 9 mm of late subacute versus chronic ischemic infarct involving posterior right frontal centrum semiovale, MRA head/neck unrevealing without significant stenosis.  --continue asa and statin --needs LTACH vs SNF placement   Stage III right buttock decubitus -Initially started as a deep tissue injury and has progressed to decubitus Pressure Injury 01/04/21 Buttocks Posterior;Right;Mid Stage 3 -  Full thickness tissue loss. Subcutaneous fat may be visible but bone, tendon or muscle are NOT exposed. medial buttocks where rectal tube laid. (tube removed) (Active)  01/04/21 0800  Location: Buttocks  Location Orientation: Posterior;Right;Mid  Staging: Stage 3 -  Full thickness tissue loss. Subcutaneous fat may be visible but bone, tendon or muscle are NOT exposed.  Wound Description (Comments): medial buttocks where rectal tube laid. (tube removed)  Present on Admission: No  --Continue antibiotics as above,  Flagyl 500 mg per tube q12h. --Seen by general surgery 4/28, no need for debridement --Wound care following with recommendations for Santyl-5/3 attending physician ordered 3 days of twice daily Dakin's solution; continue hydrotherapy with PT  Type 2 diabetes mellitus On glipizide at home.  Hemoglobin A1c 6.8. --Lantus 35 u BID and Novolog 8 u [place] q4h  Left IJ/subclavian DVT versus septic thrombus -Left IJ central line subsequently discontinued and RUE PICC line removed 4/24   --Repeat ultrasound duplex scan 4/25 with resolution of DVT. --Continue Lovenox therapeutic dose  Essential hypertension --Continue carvedilol 3.125 mg twice daily  Recurrent pyelonephritis with obstructive hydronephrosis -- Underwent left ureteral stent 4/6.   --Repeat ultrasound with mild left hydronephrosis.  CT abdomen with stable position of double-J stent. -- Continue Foley catheter  Dysphagia/mild protein calorie malnutrition/morbid obesity --5/1 SLP recommended transition to D2 diet with nectar thick liquid --5/2 transition to nocturnal tube feedings and begin calorie count Nutrition Problem: Increased nutrient needs Etiology: wound healing Signs/Symptoms: estimated needs Interventions: Tube feeding Estimated body mass index is 37.36 kg/m as calculated from the following:   Height as of this encounter: _0  (1.88 m).   Weight as of this encounter: 132 kg.  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  Prognosis has been relayed to his spouse on multiple occasions given his overall poor clinical condition.  Remains full code    Other problems: NASH cirrhosis:   Outpatient follow-up with gastroenterology  Mild hyponatremia --Resolved  Hypokalemia --Resolved  Acute renal failure -- Resolved  HLD:  -- Continue atorvastatin 40 mg daily      Data Reviewed: Basic Metabolic Panel: Recent Labs  Lab 01/07/21 0301 01/08/21 0039 01/09/21 0152 01/10/21 0250 01/11/21 0207  NA 146* 144 146* 143 140  K 3.5 3.3* 3.8 4.0 3.6  CL 108 111 111 107 106  CO2 _0 GLUCOSE 131* 170* 134* 153* 88  BUN 54* 53* 44* 38* 34*  CREATININE 0.94 0.88 0.89 0.82 0.77  CALCIUM 8.8* 8.6* 8.6* 8.6* 8.7*   CBC: Recent Labs  Lab 01/05/21 0800 01/06/21 0447 01/07/21 0301 01/08/21 0039 01/09/21 0152 01/10/21 0250 01/11/21 0207  WBC 11.7*   < > 9.7 9.4 8.6 9.1 8.3  NEUTROABS 9.6*  --   --   --   --   --   --   HGB 9.7*   < > 9.0* 8.5* 9.0* 9.0* 8.6*  HCT 30.5*   < > 29.0* 26.9* 28.1* 28.7* 27.4*  MCV 98.4   < > 100.7* 99.3 98.3 99.3 98.6  PLT 186   < > 188 193 220 218 246   < > = values in this interval not displayed.   Cardiac Enzymes: Recent Labs  Lab 01/06/21 0447  CKTOTAL 129    CBG: Recent Labs  Lab 01/10/21 1629 01/10/21 2015 01/10/21 2309 01/11/21 0356 01/11/21 0809  GLUCAP 159* 128* 154* 111* 141*     Studies: No results found.  Scheduled Meds: . acetaminophen  650 mg Oral Q6H  . aspirin  81 mg Per Tube Daily  . atorvastatin  40 mg Per Tube Daily  . carvedilol  3.125 mg Oral BID WC  . chlorhexidine  15 mL Mouth Rinse BID  . Chlorhexidine Gluconate Cloth  6 each Topical Daily  . collagenase   Topical Daily  . docusate  50 mg Per Tube Daily  . DULoxetine  30 mg Oral Daily  . enoxaparin (LOVENOX) injection  120 mg Subcutaneous Q12H  . feeding supplement (PROSource TF)  90 mL Per Tube QID  . free water  250 mL Per Tube Q4H  . insulin aspart  0-20 Units Subcutaneous Q4H  . insulin aspart  8 Units Subcutaneous Q4H  . insulin detemir  35 Units Subcutaneous BID  . mouth rinse  15 mL Mouth Rinse q12n4p  .  metroNIDAZOLE  500 mg Oral Q12H  . nutrition supplement (JUVEN)  1 packet Per Tube BID BM  . oxyCODONE  10 mg Per Tube Q6H  . polyethylene glycol  17 g Per Tube Daily  . QUEtiapine  25 mg Oral QHS  . sodium chloride flush  10-40 mL Intracatheter Q12H  . vitamin B-12  100 mcg Per Tube Daily   Continuous Infusions: . sodium chloride Stopped (12/31/20 1020)  .  ceFAZolin (ANCEF) IV 2 g (01/11/21 0542)  . DAPTOmycin (CUBICIN)  IV Stopped (01/11/21 0813)  . feeding supplement (OSMOLITE 1.2 CAL)      Active Problems:   Acute respiratory failure (HCC)   AKI (acute kidney injury) ([place])   Bacteremia due to Escherichia coli   Ureteral stone   Pyelonephritis  Cerebral thrombosis with cerebral infarction   Staphylococcus epidermidis bacteremia   Pressure injury of skin   Consultants:  PCCM  Urology Dr. Claudia Desanctis, Dr. Matilde Sprang  Nephrology, Dr. Jonnie Finner, Dr. Augustin Coupe  Neurology, Dr. Leonie Man  Infectious disease    Procedures:  See above    Time spent: 35 minutes    Erin Hearing ANP  [facility] 7 am - 330 pm/M-F for direct patient care and secure chat Please refer to Amion for contact info 32  days

## 2021-01-11 NOTE — Progress Notes (Signed)
[facility] Infectious Diseases Follow Up Note  Patient Identification: Patient Name: [name] MRN: [id number] [place] Date: 12/10/2020  3:36 AM Age: 60 y.o.Today's Date: 01/11/2021   Reason for Visit: Follow-up on bacteremia  Active Problems:   Acute respiratory failure ([place])   AKI (acute kidney injury) ([place])   Bacteremia due to Escherichia coli   Ureteral stone   Pyelonephritis   Cerebral thrombosis with cerebral infarction   Staphylococcus epidermidis bacteremia   Pressure injury of skin  Antibiotics:Ceftriaxone 3/31-4/3,4/7-4/11,4/14-4/15,ceftriaxone 4/24-28 cefazolin 4/28-current                    Vancomycin 4/14-4/25, Daptomycin 4/26 - current                    Metronidazole 4/27- current  Lines/Tubes:PIV's,urethral catheter,NG tube   Interval Events: Remains afebrile, no leukocytosis, hemodynamically stable   Assessment MRSE Bacteremia( clabsi) with possible septic thrombophlebitis of left IJ and left subclavian vein status post removal of PICC line on 4/24.  Dopplers on 4/25 DVT in left IJ and left subclavian vein has been resolved. TTE with no vegetations.   MSSA pneumonia s/p treatment   Recurrent E. coli bacteremia in the setting of prior E. coli bacteremia and pyelonephritis complicated by hydronephrosis status post stent placement on 4/6 and appropriate antibiotic treatment  Left ureteral stone Liver cirrhosis   Recommendations Continue daptomycin for 6 weeks from 4/24 given MRSA bacteremia with septic thrombophlebitis. End date 02/15/21 Continue cefazolin, plan for total 10 days including ceftriaxone and cefazolin combined  Continue metronidazole for total of 10 days Monitor CBC, BMP and CPK  A follow up with [facility] has been made  PT/OT OPAT orders in  Will sign off for now  Rest of the management as per the primary team. Thank you for the consult. Please page with  pertinent questions or concerns.  ______________________________________________________________________ Subjective patient seen and examined at the bedside. Doing well. No complaints   Vitals BP (!) 121/59 (BP Location: Left Leg)   Pulse (!) 104   Temp 98.6 F (37 C) (Oral)   Resp 20   Ht _0  (1.88 m)   Wt 132 kg   SpO2 97%   BMI 37.36 kg/m      Physical Exam Constitutional:  Not in acute distress, lying in bed, has a coatrak     Comments:   Cardiovascular:     Rate and Rhythm: Normal rate and regular rhythm.     Heart sounds:  Pulmonary:     Effort: Pulmonary effort is normal.     Comments:   Abdominal:     Palpations: Abdomen is soft.     Tenderness: Non tender  Musculoskeletal:        General: No swelling or tenderness. Left Upper extremity weakness   Skin:    Comments: no obvious lesions or rashes   Neurological:     General: Left UE weakness   Psychiatric:        Mood and Affect: Mood normal.    Pertinent Microbiology Results for orders placed or performed during the hospital encounter of 12/10/20  Culture, blood (routine x 2)     Status: Abnormal   Collection Time: 12/10/20  3:50 AM   Specimen: BLOOD  Result Value Ref Range Status   Specimen Description BLOOD LEFT ANTECUBITAL  Final   Special Requests   Final    BOTTLES DRAWN AEROBIC AND ANAEROBIC Blood Culture adequate volume   Culture  Setup Time  Final    GRAM NEGATIVE RODS IN BOTH AEROBIC AND ANAEROBIC BOTTLES CRITICAL RESULT CALLED TO, READ BACK BY AND VERIFIED WITHJeanette Caprice [facility] 9937 12/10/20 A BROWNING Performed at [facility], [place] [address] [place], [place] [address]    Culture ESCHERICHIA COLI (A)  Final   Report Status 12/12/2020 FINAL  Final   Organism ID, Bacteria ESCHERICHIA COLI  Final      Susceptibility   Escherichia coli - MIC*    AMPICILLIN 8 SENSITIVE Sensitive     CEFAZOLIN <=4 SENSITIVE Sensitive     CEFEPIME <=0.12 SENSITIVE Sensitive     CEFTAZIDIME <=1  SENSITIVE Sensitive     CEFTRIAXONE <=0.25 SENSITIVE Sensitive     CIPROFLOXACIN <=0.25 SENSITIVE Sensitive     GENTAMICIN <=1 SENSITIVE Sensitive     IMIPENEM <=0.25 SENSITIVE Sensitive     TRIMETH/SULFA <=20 SENSITIVE Sensitive     AMPICILLIN/SULBACTAM <=2 SENSITIVE Sensitive     PIP/TAZO <=4 SENSITIVE Sensitive     * ESCHERICHIA COLI  Blood Culture ID Panel (Reflexed)     Status: Abnormal   Collection Time: 12/10/20  3:50 AM  Result Value Ref Range Status   Enterococcus faecalis NOT DETECTED NOT DETECTED Final   Enterococcus Faecium NOT DETECTED NOT DETECTED Final   Listeria monocytogenes NOT DETECTED NOT DETECTED Final   Staphylococcus species NOT DETECTED NOT DETECTED Final   Staphylococcus aureus (BCID) NOT DETECTED NOT DETECTED Final   Staphylococcus epidermidis NOT DETECTED NOT DETECTED Final   Staphylococcus lugdunensis NOT DETECTED NOT DETECTED Final   Streptococcus species NOT DETECTED NOT DETECTED Final   Streptococcus agalactiae NOT DETECTED NOT DETECTED Final   Streptococcus pneumoniae NOT DETECTED NOT DETECTED Final   Streptococcus pyogenes NOT DETECTED NOT DETECTED Final   A.calcoaceticus-baumannii NOT DETECTED NOT DETECTED Final   Bacteroides fragilis NOT DETECTED NOT DETECTED Final   Enterobacterales DETECTED (A) NOT DETECTED Final    Comment: Enterobacterales represent a large order of gram negative bacteria, not a single organism. CRITICAL RESULT CALLED TO, READ BACK BY AND VERIFIED WITH: Jeanette Caprice PHARMD [id number] 12/10/20 A BROWNING    Enterobacter cloacae complex NOT DETECTED NOT DETECTED Final   Escherichia coli DETECTED (A) NOT DETECTED Final    Comment: CRITICAL RESULT CALLED TO, READ BACK BY AND VERIFIED WITH: Jeanette Caprice PHARMD 1017 12/10/20 A BROWNING    Klebsiella aerogenes NOT DETECTED NOT DETECTED Final   Klebsiella oxytoca NOT DETECTED NOT DETECTED Final   Klebsiella pneumoniae NOT DETECTED NOT DETECTED Final   Proteus species NOT DETECTED NOT DETECTED  Final   Salmonella species NOT DETECTED NOT DETECTED Final   Serratia marcescens NOT DETECTED NOT DETECTED Final   Haemophilus influenzae NOT DETECTED NOT DETECTED Final   Neisseria meningitidis NOT DETECTED NOT DETECTED Final   Pseudomonas aeruginosa NOT DETECTED NOT DETECTED Final   Stenotrophomonas maltophilia NOT DETECTED NOT DETECTED Final   Candida albicans NOT DETECTED NOT DETECTED Final   Candida auris NOT DETECTED NOT DETECTED Final   Candida glabrata NOT DETECTED NOT DETECTED Final   Candida krusei NOT DETECTED NOT DETECTED Final   Candida parapsilosis NOT DETECTED NOT DETECTED Final   Candida tropicalis NOT DETECTED NOT DETECTED Final   Cryptococcus neoformans/gattii NOT DETECTED NOT DETECTED Final   CTX-M ESBL NOT DETECTED NOT DETECTED Final   Carbapenem resistance IMP NOT DETECTED NOT DETECTED Final   Carbapenem resistance KPC NOT DETECTED NOT DETECTED Final   Carbapenem resistance NDM NOT DETECTED NOT DETECTED Final   Carbapenem resist  OXA 48 LIKE NOT DETECTED NOT DETECTED Final   Carbapenem resistance VIM NOT DETECTED NOT DETECTED Final    Comment: Performed at [facility], [place] [address] [place], [place] [address]  Resp Panel by RT-PCR (Flu A&B, Covid) Nasopharyngeal Swab     Status: None   Collection Time: 12/10/20  3:59 AM   Specimen: Nasopharyngeal Swab; Nasopharyngeal(NP) swabs in vial transport medium  Result Value Ref Range Status   SARS Coronavirus 2 by RT PCR NEGATIVE NEGATIVE Final    Comment: (NOTE) SARS-CoV-2 target nucleic acids are NOT DETECTED.  The SARS-CoV-2 RNA is generally detectable in upper respiratory specimens during the acute phase of infection. The lowest concentration of SARS-CoV-2 viral copies this assay can detect is 138 copies/mL. A negative result does not preclude SARS-Cov-2 infection and should not be used as the sole basis for treatment or other patient management decisions. A negative result may occur with  improper specimen  collection/handling, submission of specimen other than nasopharyngeal swab, presence of viral mutation(s) within the areas targeted by this assay, and inadequate number of viral copies(<138 copies/mL). A negative result must be combined with clinical observations, patient history, and epidemiological information. The expected result is Negative.  Fact Sheet for Patients:  EntrepreneurPulse.com.au  Fact Sheet for Healthcare Providers:  IncredibleEmployment.be  This test is no t yet approved or cleared by the [place] FDA and  has been authorized for detection and/or diagnosis of SARS-CoV-2 by FDA under an Emergency Use Authorization (EUA). This EUA will remain  in effect (meaning this test can be used) for the duration of the COVID-19 declaration under Section 564(b)(1) of the Act, 21 U.S.C.section 360bbb-3(b)(1), unless the authorization is terminated  or revoked sooner.       Influenza A by PCR NEGATIVE NEGATIVE Final   Influenza B by PCR NEGATIVE NEGATIVE Final    Comment: (NOTE) The Xpert Xpress SARS-CoV-2/FLU/RSV plus assay is intended as an aid in the diagnosis of influenza from Nasopharyngeal swab specimens and should not be used as a sole basis for treatment. Nasal washings and aspirates are unacceptable for Xpert Xpress SARS-CoV-2/FLU/RSV testing.  Fact Sheet for Patients: EntrepreneurPulse.com.au  Fact Sheet for Healthcare Providers: IncredibleEmployment.be  This test is not yet approved or cleared by the [place] FDA and has been authorized for detection and/or diagnosis of SARS-CoV-2 by FDA under an Emergency Use Authorization (EUA). This EUA will remain in effect (meaning this test can be used) for the duration of the COVID-19 declaration under Section 564(b)(1) of the Act, 21 U.S.C. section 360bbb-3(b)(1), unless the authorization is terminated or revoked.  Performed at [facility], [place] [address] [place], [place] [address]   Culture, blood (routine x 2)     Status: Abnormal   Collection Time: 12/10/20  4:06 AM   Specimen: BLOOD RIGHT HAND  Result Value Ref Range Status   Specimen Description BLOOD RIGHT HAND  Final   Special Requests   Final    BOTTLES DRAWN AEROBIC AND ANAEROBIC Blood Culture results may not be optimal due to an inadequate volume of blood received in culture bottles   Culture  Setup Time   Final    GRAM NEGATIVE RODS IN BOTH AEROBIC AND ANAEROBIC BOTTLES IDENTIFICATION TO FOLLOW CRITICAL RESULT CALLED TO, READ BACK BY AND VERIFIED WITHJeanette Caprice PHARMD [id number] 12/10/20 A BROWNING    Culture (A)  Final    ESCHERICHIA COLI SUSCEPTIBILITIES PERFORMED ON PREVIOUS CULTURE WITHIN THE LAST 5 DAYS. Performed  at [facility], [place] [address], [place], [place] [address]    Report Status 12/12/2020 FINAL  Final  Urine culture     Status: Abnormal   Collection Time: 12/10/20  4:40 AM   Specimen: Urine, Clean Catch  Result Value Ref Range Status   Specimen Description URINE, CLEAN CATCH  Final   Special Requests   Final    NONE Performed at [facility], [place] [address], [place], [place] [address]    Culture >=100,000 COLONIES/mL ESCHERICHIA COLI (A)  Final   Report Status 12/12/2020 FINAL  Final   Organism ID, Bacteria ESCHERICHIA COLI (A)  Final      Susceptibility   Escherichia coli - MIC*    AMPICILLIN 8 SENSITIVE Sensitive     CEFAZOLIN <=4 SENSITIVE Sensitive     CEFEPIME <=0.12 SENSITIVE Sensitive     CEFTRIAXONE <=0.25 SENSITIVE Sensitive     CIPROFLOXACIN <=0.25 SENSITIVE Sensitive     GENTAMICIN <=1 SENSITIVE Sensitive     IMIPENEM <=0.25 SENSITIVE Sensitive     NITROFURANTOIN <=16 SENSITIVE Sensitive     TRIMETH/SULFA <=20 SENSITIVE Sensitive     AMPICILLIN/SULBACTAM <=2 SENSITIVE Sensitive     PIP/TAZO <=4 SENSITIVE Sensitive     * >=100,000 COLONIES/mL ESCHERICHIA COLI  MRSA PCR Screening     Status: None    Collection Time: 12/10/20 11:24 AM   Specimen: Nasal Mucosa; Nasopharyngeal  Result Value Ref Range Status   MRSA by PCR NEGATIVE NEGATIVE Final    Comment:        The GeneXpert MRSA Assay (FDA approved for NASAL specimens only), is one component of a comprehensive MRSA colonization surveillance program. It is not intended to diagnose MRSA infection nor to guide or monitor treatment for MRSA infections. Performed at [facility], [place] [address] [place], [place] [address]   Culture, Respiratory w Gram Stain     Status: None   Collection Time: 12/15/20  7:39 AM   Specimen: Tracheal Aspirate; Respiratory  Result Value Ref Range Status   Specimen Description TRACHEAL ASPIRATE  Final   Special Requests NONE  Final   Gram Stain   Final    RARE WBC PRESENT, PREDOMINANTLY PMN MODERATE GRAM NEGATIVE RODS RARE GRAM POSITIVE COCCI    Culture   Final    RARE Consistent with normal respiratory flora. No Pseudomonas species isolated Performed at [place] [address], [place], [place] [address]    Report Status 12/17/2020 FINAL  Final  Culture, BAL-quantitative w Gram Stain     Status: Abnormal   Collection Time: 12/15/20  9:36 AM   Specimen: Bronchoalveolar Lavage; Respiratory  Result Value Ref Range Status   Specimen Description BRONCHIAL ALVEOLAR LAVAGE  Final   Special Requests NONE  Final   Gram Stain   Final    FEW WBC PRESENT,BOTH PMN AND MONONUCLEAR NO ORGANISMS SEEN    Culture (A)  Final    3,000 COLONIES/mL Consistent with normal respiratory flora. Performed at [facility], [place] [address], [place], [place] [address]    Report Status 12/17/2020 FINAL  Final  Urine Culture     Status: None   Collection Time: 12/16/20  9:53 PM   Specimen: Urine, Cystoscope  Result Value Ref Range Status   Specimen Description URINE, RANDOM  Final   Special Requests PT ON CEFATAN,CYSTO URINE  Final   Culture   Final    NO GROWTH Performed at [facility], [place] [address] [place], [place] [address]    Report Status 12/18/2020 FINAL  Final  Culture, Respiratory w Gram Stain     Status: None   Collection Time: 12/20/20 11:45 AM   Specimen: Tracheal Aspirate; Respiratory  Result Value Ref Range Status   Specimen Description TRACHEAL ASPIRATE  Final   Special Requests NONE  Final   Gram Stain   Final    ABUNDANT WBC PRESENT, PREDOMINANTLY PMN FEW SQUAMOUS EPITHELIAL CELLS PRESENT MODERATE GRAM POSITIVE COCCI IN CLUSTERS RARE GRAM POSITIVE RODS RARE GRAM NEGATIVE RODS Performed at [facility], [place] [address], [place], [place] [address]    Culture FEW STAPHYLOCOCCUS AUREUS  Final   Report Status 12/24/2020 FINAL  Final   Organism ID, Bacteria STAPHYLOCOCCUS AUREUS  Final      Susceptibility   Staphylococcus aureus - MIC*    CIPROFLOXACIN <=0.5 SENSITIVE Sensitive     ERYTHROMYCIN <=0.25 SENSITIVE Sensitive     GENTAMICIN <=0.5 SENSITIVE Sensitive     OXACILLIN 0.5 SENSITIVE Sensitive     TETRACYCLINE <=1 SENSITIVE Sensitive     VANCOMYCIN <=0.5 SENSITIVE Sensitive     TRIMETH/SULFA <=10 SENSITIVE Sensitive     CLINDAMYCIN <=0.25 SENSITIVE Sensitive     RIFAMPIN <=0.5 SENSITIVE Sensitive     Inducible Clindamycin NEGATIVE Sensitive     * FEW STAPHYLOCOCCUS AUREUS  Culture, blood (Routine X 2) w Reflex to ID Panel     Status: Abnormal   Collection Time: 12/25/20  8:07 AM   Specimen: BLOOD LEFT HAND  Result Value Ref Range Status   Specimen Description BLOOD LEFT HAND  Final   Special Requests   Final    BOTTLES DRAWN AEROBIC ONLY Blood Culture results may not be optimal due to an inadequate volume of blood received in culture bottles   Culture  Setup Time   Final    GRAM POSITIVE COCCI IN CLUSTERS AEROBIC BOTTLE ONLY    Culture (A)  Final    STAPHYLOCOCCUS EPIDERMIDIS SUSCEPTIBILITIES PERFORMED ON PREVIOUS CULTURE WITHIN THE LAST 5 DAYS. Performed at [facility], [place] [address], [place], [place] [address]     Report Status 12/27/2020 FINAL  Final  Culture, blood (Routine X 2) w Reflex to ID Panel     Status: Abnormal   Collection Time: 12/25/20  8:12 AM   Specimen: BLOOD RIGHT HAND  Result Value Ref Range Status   Specimen Description BLOOD RIGHT HAND  Final   Special Requests   Final    BOTTLES DRAWN AEROBIC ONLY Blood Culture results may not be optimal due to an inadequate volume of blood received in culture bottles   Culture  Setup Time   Final    GRAM POSITIVE COCCI IN CLUSTERS AEROBIC BOTTLE ONLY CRITICAL RESULT CALLED TO, READ BACK BY AND VERIFIED WITH: PHARMD E.WOLFE AT 3664 ON 12/25/2020 BY T.SAAD Performed at [facility], [place] [address], [place],  [address]    Culture STAPHYLOCOCCUS EPIDERMIDIS (A)  Final   Report Status 12/27/2020 FINAL  Final   Organism ID, Bacteria STAPHYLOCOCCUS EPIDERMIDIS  Final      Susceptibility   Staphylococcus epidermidis - MIC*    CIPROFLOXACIN >=8 RESISTANT Resistant     ERYTHROMYCIN >=8 RESISTANT Resistant     GENTAMICIN 8 INTERMEDIATE Intermediate     OXACILLIN >=4 RESISTANT Resistant     TETRACYCLINE 2 SENSITIVE Sensitive     VANCOMYCIN 1 SENSITIVE Sensitive     TRIMETH/SULFA 80 RESISTANT  Resistant     CLINDAMYCIN >=8 RESISTANT Resistant     RIFAMPIN <=0.5 SENSITIVE Sensitive     Inducible Clindamycin NEGATIVE Sensitive     * STAPHYLOCOCCUS EPIDERMIDIS  Blood Culture ID Panel (Reflexed)     Status: Abnormal   Collection Time: 12/25/20  8:12 AM  Result Value Ref Range Status   Enterococcus faecalis NOT DETECTED NOT DETECTED Final   Enterococcus Faecium NOT DETECTED NOT DETECTED Final   Listeria monocytogenes NOT DETECTED NOT DETECTED Final   Staphylococcus species DETECTED (A) NOT DETECTED Final    Comment: CRITICAL RESULT CALLED TO, READ BACK BY AND VERIFIED WITH: PHARMD E.WOLFE AT 1256 ON 12/25/2020 BY T.SAAD.    Staphylococcus aureus (BCID) NOT DETECTED NOT DETECTED Final   Staphylococcus epidermidis DETECTED (A) NOT  DETECTED Final    Comment: Methicillin (oxacillin) resistant coagulase negative staphylococcus. Possible blood culture contaminant (unless isolated from more than one blood culture draw or clinical case suggests pathogenicity). No antibiotic treatment is indicated for blood  culture contaminants.    Staphylococcus lugdunensis NOT DETECTED NOT DETECTED Final   Streptococcus species NOT DETECTED NOT DETECTED Final   Streptococcus agalactiae NOT DETECTED NOT DETECTED Final   Streptococcus pneumoniae NOT DETECTED NOT DETECTED Final   Streptococcus pyogenes NOT DETECTED NOT DETECTED Final   A.calcoaceticus-baumannii NOT DETECTED NOT DETECTED Final   Bacteroides fragilis NOT DETECTED NOT DETECTED Final   Enterobacterales NOT DETECTED NOT DETECTED Final   Enterobacter cloacae complex NOT DETECTED NOT DETECTED Final   Escherichia coli NOT DETECTED NOT DETECTED Final   Klebsiella aerogenes NOT DETECTED NOT DETECTED Final   Klebsiella oxytoca NOT DETECTED NOT DETECTED Final   Klebsiella pneumoniae NOT DETECTED NOT DETECTED Final   Proteus species NOT DETECTED NOT DETECTED Final   Salmonella species NOT DETECTED NOT DETECTED Final   Serratia marcescens NOT DETECTED NOT DETECTED Final   Haemophilus influenzae NOT DETECTED NOT DETECTED Final   Neisseria meningitidis NOT DETECTED NOT DETECTED Final   Pseudomonas aeruginosa NOT DETECTED NOT DETECTED Final   Stenotrophomonas maltophilia NOT DETECTED NOT DETECTED Final   Candida albicans NOT DETECTED NOT DETECTED Final   Candida auris NOT DETECTED NOT DETECTED Final   Candida glabrata NOT DETECTED NOT DETECTED Final   Candida krusei NOT DETECTED NOT DETECTED Final   Candida parapsilosis NOT DETECTED NOT DETECTED Final   Candida tropicalis NOT DETECTED NOT DETECTED Final   Cryptococcus neoformans/gattii NOT DETECTED NOT DETECTED Final   Methicillin resistance mecA/C DETECTED (A) NOT DETECTED Final    Comment: CRITICAL RESULT CALLED TO, READ BACK BY AND  VERIFIED WITH: PHARMD E.WOLFE AT 1256 ON 12/25/2020 BY T.SAAD. Performed at [facility], [place] [address] [place], [place] [address]   Culture, blood (routine x 2)     Status: None   Collection Time: 12/26/20  5:43 AM   Specimen: BLOOD  Result Value Ref Range Status   Specimen Description BLOOD RIGHT ANTECUBITAL  Final   Special Requests   Final    BOTTLES DRAWN AEROBIC AND ANAEROBIC Blood Culture adequate volume   Culture   Final    NO GROWTH 5 DAYS Performed at [facility], [place] [address] [place], [place] [address]    Report Status 12/31/2020 FINAL  Final  Culture, blood (routine x 2)     Status: None   Collection Time: 12/26/20  5:43 AM   Specimen: BLOOD RIGHT HAND  Result Value Ref Range Status   Specimen Description BLOOD RIGHT HAND  Final  Special Requests   Final    BOTTLES DRAWN AEROBIC ONLY Blood Culture results may not be optimal due to an inadequate volume of blood received in culture bottles   Culture   Final    NO GROWTH 5 DAYS Performed at [facility], [place] [address], [place], [place] [address]    Report Status 12/31/2020 FINAL  Final  Culture, Urine     Status: None   Collection Time: 01/02/21  4:06 AM   Specimen: Urine, Random  Result Value Ref Range Status   Specimen Description URINE, RANDOM  Final   Special Requests NONE  Final   Culture   Final    NO GROWTH Performed at [facility], [place] [address], [place], [place] [address]    Report Status 01/03/2021 FINAL  Final  Culture, blood (routine x 2)     Status: Abnormal   Collection Time: 01/02/21  4:19 AM   Specimen: BLOOD LEFT HAND  Result Value Ref Range Status   Specimen Description BLOOD LEFT HAND  Final   Special Requests AEROBIC BOTTLE ONLY Blood Culture adequate volume  Final   Culture  Setup Time   Final    GRAM POSITIVE COCCI IN CLUSTERS AEROBIC BOTTLE ONLY CRITICAL VALUE NOTED.  VALUE IS CONSISTENT WITH PREVIOUSLY REPORTED AND CALLED VALUE.    Culture (A)  Final     STAPHYLOCOCCUS EPIDERMIDIS SUSCEPTIBILITIES PERFORMED ON PREVIOUS CULTURE WITHIN THE LAST 5 DAYS. Performed at [facility], [place] [address] [place], [place] [address]    Report Status 01/05/2021 FINAL  Final  Culture, blood (routine x 2)     Status: Abnormal   Collection Time: 01/02/21  4:32 AM   Specimen: BLOOD LEFT HAND  Result Value Ref Range Status   Specimen Description BLOOD LEFT HAND  Final   Special Requests   Final    BOTTLES DRAWN AEROBIC AND ANAEROBIC Blood Culture results may not be optimal due to an inadequate volume of blood received in culture bottles   Culture  Setup Time   Final    GRAM POSITIVE COCCI ANAEROBIC BOTTLE ONLY CRITICAL RESULT CALLED TO, READ BACK BY AND VERIFIED WITH: PHARMD GREG ABBOTT 01/03/2021 AT 0630 A.HUGHES Performed at [facility], [place] [address], [place], [place] [address]    Culture STAPHYLOCOCCUS EPIDERMIDIS (A)  Final   Report Status 01/05/2021 FINAL  Final   Organism ID, Bacteria STAPHYLOCOCCUS EPIDERMIDIS  Final      Susceptibility   Staphylococcus epidermidis - MIC*    CIPROFLOXACIN >=8 RESISTANT Resistant     ERYTHROMYCIN >=8 RESISTANT Resistant     GENTAMICIN >=16 RESISTANT Resistant     OXACILLIN >=4 RESISTANT Resistant     TETRACYCLINE 2 SENSITIVE Sensitive     VANCOMYCIN 1 SENSITIVE Sensitive     TRIMETH/SULFA 80 RESISTANT Resistant     CLINDAMYCIN >=8 RESISTANT Resistant     RIFAMPIN <=0.5 SENSITIVE Sensitive     Inducible Clindamycin NEGATIVE Sensitive     * STAPHYLOCOCCUS EPIDERMIDIS  Blood Culture ID Panel (Reflexed)     Status: Abnormal   Collection Time: 01/02/21  4:32 AM  Result Value Ref Range Status   Enterococcus faecalis NOT DETECTED NOT DETECTED Final   Enterococcus Faecium NOT DETECTED NOT DETECTED Final   Listeria monocytogenes NOT DETECTED NOT DETECTED Final   Staphylococcus species DETECTED (A) NOT DETECTED Final    Comment: CRITICAL RESULT CALLED TO, READ BACK BY AND VERIFIED WITH: PHARMD  GREG ABBOTT 01/03/2021  AT 0630 A.HUGHES    Staphylococcus aureus (BCID) NOT DETECTED NOT DETECTED Final   Staphylococcus epidermidis DETECTED (A) NOT DETECTED Final    Comment: Methicillin (oxacillin) resistant coagulase negative staphylococcus. Possible blood culture contaminant (unless isolated from more than one blood culture draw or clinical case suggests pathogenicity). No antibiotic treatment is indicated for blood  culture contaminants. CRITICAL RESULT CALLED TO, READ BACK BY AND VERIFIED WITH: PHARMD GREG ABBOTT 01/03/2021 AT 0630 A.HUGHES    Staphylococcus lugdunensis NOT DETECTED NOT DETECTED Final   Streptococcus species NOT DETECTED NOT DETECTED Final   Streptococcus agalactiae NOT DETECTED NOT DETECTED Final   Streptococcus pneumoniae NOT DETECTED NOT DETECTED Final   Streptococcus pyogenes NOT DETECTED NOT DETECTED Final   A.calcoaceticus-baumannii NOT DETECTED NOT DETECTED Final   Bacteroides fragilis NOT DETECTED NOT DETECTED Final   Enterobacterales NOT DETECTED NOT DETECTED Final   Enterobacter cloacae complex NOT DETECTED NOT DETECTED Final   Escherichia coli NOT DETECTED NOT DETECTED Final   Klebsiella aerogenes NOT DETECTED NOT DETECTED Final   Klebsiella oxytoca NOT DETECTED NOT DETECTED Final   Klebsiella pneumoniae NOT DETECTED NOT DETECTED Final   Proteus species NOT DETECTED NOT DETECTED Final   Salmonella species NOT DETECTED NOT DETECTED Final   Serratia marcescens NOT DETECTED NOT DETECTED Final   Haemophilus influenzae NOT DETECTED NOT DETECTED Final   Neisseria meningitidis NOT DETECTED NOT DETECTED Final   Pseudomonas aeruginosa NOT DETECTED NOT DETECTED Final   Stenotrophomonas maltophilia NOT DETECTED NOT DETECTED Final   Candida albicans NOT DETECTED NOT DETECTED Final   Candida auris NOT DETECTED NOT DETECTED Final   Candida glabrata NOT DETECTED NOT DETECTED Final   Candida krusei NOT DETECTED NOT DETECTED Final   Candida parapsilosis NOT DETECTED  NOT DETECTED Final   Candida tropicalis NOT DETECTED NOT DETECTED Final   Cryptococcus neoformans/gattii NOT DETECTED NOT DETECTED Final   Methicillin resistance mecA/C DETECTED (A) NOT DETECTED Final    Comment: CRITICAL RESULT CALLED TO, READ BACK BY AND VERIFIED WITH: PHARMD GREG ABBOTT 01/03/2021 AT 0630 A.HUGHES Performed at [facility], [place] [address], [place], [place] [address]   Culture, blood (Routine X 2) w Reflex to ID Panel     Status: Abnormal   Collection Time: 01/03/21 11:31 AM   Specimen: BLOOD LEFT HAND  Result Value Ref Range Status   Specimen Description BLOOD LEFT HAND  Final   Special Requests   Final    BOTTLES DRAWN AEROBIC ONLY Blood Culture adequate volume   Culture  Setup Time   Final    GRAM NEGATIVE RODS AEROBIC BOTTLE ONLY PHARMD GREG ABBOTT 01/04/2021 AT 0425 A.HUGHES CRITICAL VALUE NOTED.  VALUE IS CONSISTENT WITH PREVIOUSLY REPORTED AND CALLED VALUE.    Culture (A)  Final    ESCHERICHIA COLI SUSCEPTIBILITIES PERFORMED ON PREVIOUS CULTURE WITHIN THE LAST 5 DAYS. Performed at [facility], [place] [address], [place], [place] [address]    Report Status 01/06/2021 FINAL  Final  Culture, blood (Routine X 2) w Reflex to ID Panel     Status: Abnormal   Collection Time: 01/03/21 11:31 AM   Specimen: BLOOD LEFT FOREARM  Result Value Ref Range Status   Specimen Description BLOOD LEFT FOREARM  Final   Special Requests   Final    BOTTLES DRAWN AEROBIC ONLY Blood Culture adequate volume   Culture  Setup Time   Final    GRAM NEGATIVE RODS AEROBIC BOTTLE ONLY CRITICAL RESULT CALLED TO, READ BACK BY  AND VERIFIED WITH: PHARMD GREG ABBOTT 01/04/2021 AT 0425 A.HUGHES Performed at [facility], [place] [address], [place], [place] [address]    Culture ESCHERICHIA COLI (A)  Final   Report Status 01/06/2021 FINAL  Final   Organism ID, Bacteria ESCHERICHIA COLI  Final      Susceptibility   Escherichia coli - MIC*    AMPICILLIN 8 SENSITIVE Sensitive      CEFAZOLIN <=4 SENSITIVE Sensitive     CEFEPIME <=0.12 SENSITIVE Sensitive     CEFTAZIDIME <=1 SENSITIVE Sensitive     CEFTRIAXONE <=0.25 SENSITIVE Sensitive     CIPROFLOXACIN <=0.25 SENSITIVE Sensitive     GENTAMICIN <=1 SENSITIVE Sensitive     IMIPENEM <=0.25 SENSITIVE Sensitive     TRIMETH/SULFA <=20 SENSITIVE Sensitive     AMPICILLIN/SULBACTAM <=2 SENSITIVE Sensitive     PIP/TAZO <=4 SENSITIVE Sensitive     * ESCHERICHIA COLI  Blood Culture ID Panel (Reflexed)     Status: Abnormal   Collection Time: 01/03/21 11:31 AM  Result Value Ref Range Status   Enterococcus faecalis NOT DETECTED NOT DETECTED Final   Enterococcus Faecium NOT DETECTED NOT DETECTED Final   Listeria monocytogenes NOT DETECTED NOT DETECTED Final   Staphylococcus species NOT DETECTED NOT DETECTED Final   Staphylococcus aureus (BCID) NOT DETECTED NOT DETECTED Final   Staphylococcus epidermidis NOT DETECTED NOT DETECTED Final   Staphylococcus lugdunensis NOT DETECTED NOT DETECTED Final   Streptococcus species NOT DETECTED NOT DETECTED Final   Streptococcus agalactiae NOT DETECTED NOT DETECTED Final   Streptococcus pneumoniae NOT DETECTED NOT DETECTED Final   Streptococcus pyogenes NOT DETECTED NOT DETECTED Final   A.calcoaceticus-baumannii NOT DETECTED NOT DETECTED Final   Bacteroides fragilis NOT DETECTED NOT DETECTED Final   Enterobacterales DETECTED (A) NOT DETECTED Final    Comment: Enterobacterales represent a large order of gram negative bacteria, not a single organism. CRITICAL RESULT CALLED TO, READ BACK BY AND VERIFIED WITH: PHARMD GREG ABBOTT 01/04/2021 AT 0425 A.HUGHES    Enterobacter cloacae complex NOT DETECTED NOT DETECTED Final   Escherichia coli DETECTED (A) NOT DETECTED Final    Comment: CRITICAL RESULT CALLED TO, READ BACK BY AND VERIFIED WITH: PHARMD GREG ABBOTT 01/04/2021 AT 0425 A.HUGHES    Klebsiella aerogenes NOT DETECTED NOT DETECTED Final   Klebsiella oxytoca NOT DETECTED NOT  DETECTED Final   Klebsiella pneumoniae NOT DETECTED NOT DETECTED Final   Proteus species NOT DETECTED NOT DETECTED Final   Salmonella species NOT DETECTED NOT DETECTED Final   Serratia marcescens NOT DETECTED NOT DETECTED Final   Haemophilus influenzae NOT DETECTED NOT DETECTED Final   Neisseria meningitidis NOT DETECTED NOT DETECTED Final   Pseudomonas aeruginosa NOT DETECTED NOT DETECTED Final   Stenotrophomonas maltophilia NOT DETECTED NOT DETECTED Final   Candida albicans NOT DETECTED NOT DETECTED Final   Candida auris NOT DETECTED NOT DETECTED Final   Candida glabrata NOT DETECTED NOT DETECTED Final   Candida krusei NOT DETECTED NOT DETECTED Final   Candida parapsilosis NOT DETECTED NOT DETECTED Final   Candida tropicalis NOT DETECTED NOT DETECTED Final   Cryptococcus neoformans/gattii NOT DETECTED NOT DETECTED Final   CTX-M ESBL NOT DETECTED NOT DETECTED Final   Carbapenem resistance IMP NOT DETECTED NOT DETECTED Final   Carbapenem resistance KPC NOT DETECTED NOT DETECTED Final   Carbapenem resistance NDM NOT DETECTED NOT DETECTED Final   Carbapenem resist OXA 48 LIKE NOT DETECTED NOT DETECTED Final   Carbapenem resistance VIM NOT DETECTED NOT DETECTED Final  Comment: Performed at [facility], [place] [address], [place], [place] [address]  Culture, blood (routine x 2)     Status: None   Collection Time: 01/05/21  6:10 AM   Specimen: BLOOD RIGHT HAND  Result Value Ref Range Status   Specimen Description BLOOD RIGHT HAND  Final   Special Requests   Final    BOTTLES DRAWN AEROBIC ONLY Blood Culture adequate volume   Culture   Final    NO GROWTH 5 DAYS Performed at [facility], [place] [address], [place], [place] [address]    Report Status 01/10/2021 FINAL  Final  Culture, blood (routine x 2)     Status: None   Collection Time: 01/05/21  6:11 AM   Specimen: BLOOD RIGHT HAND  Result Value Ref Range Status   Specimen Description BLOOD RIGHT HAND  Final   Special  Requests   Final    BOTTLES DRAWN AEROBIC ONLY Blood Culture adequate volume   Culture   Final    NO GROWTH 5 DAYS Performed at [facility], [place] [address] [place], [place] [address]    Report Status 01/10/2021 FINAL  Final  Culture, Urine     Status: None   Collection Time: 01/05/21  7:38 AM   Specimen: Urine, Random  Result Value Ref Range Status   Specimen Description URINE, RANDOM  Final   Special Requests NONE  Final   Culture   Final    NO GROWTH Performed at [facility], [place] [address] [place], [place] [address]    Report Status 01/06/2021 FINAL  Final    Pertinent Lab. CBC Latest Ref Rng & Units 01/11/2021 01/10/2021 01/09/2021  WBC 4.0 - 10.5 K/uL 8.3 9.1 8.6  Hemoglobin 13.0 - 17.0 g/dL 8.6(L) 9.0(L) 9.0(L)  Hematocrit 39.0 - 52.0 % 27.4(L) 28.7(L) 28.1(L)  Platelets 150 - 400 K/uL 246 218 220   CMP Latest Ref Rng & Units 01/11/2021 01/10/2021 01/09/2021  Glucose 70 - 99 mg/dL 88 153(H) 134(H)  BUN 6 - 20 mg/dL 34(H) 38(H) 44(H)  Creatinine 0.61 - 1.24 mg/dL 0.77 0.82 0.89  Sodium 135 - 145 mmol/L 140 143 146(H)  Potassium 3.5 - 5.1 mmol/L 3.6 4.0 3.8  Chloride 98 - 111 mmol/L 106 107 111  CO2 22 - 32 mmol/L _0 Calcium 8.9 - 10.3 mg/dL 8.7(L) 8.6(L) 8.6(L)  Total Protein 6.5 - 8.1 g/dL - - -  Total Bilirubin 0.3 - 1.2 mg/dL - - -  Alkaline Phos 38 - 126 U/L - - -  AST 15 - 41 U/L - - -  ALT 0 - 44 U/L - - -     Pertinent Imaging today Plain films and CT images have been personally visualized and interpreted; radiology reports have been reviewed. Decision making incorporated into the Impression / Recommendations.  I have spent more than 35 minutes for this patient encounter including review of prior medical records, coordination of care  with greater than 50% of time being face to face/counseling and discussing diagnostics/treatment plan with the patient/family.  Electronically signed by:   Rosiland Oz, MD Infectious Disease  Physician [facility] for Infectious Disease Pager: [phone]

## 2021-01-11 NOTE — Progress Notes (Signed)
CSW sent referral to [facility] to screen this patient for possible Medicaid benefits.  Edwin Dada, MSW, LCSW Transitions of Care  Clinical Social Worker II [phone]

## 2021-01-11 NOTE — Progress Notes (Signed)
Physical Therapy Wound Treatment Patient Details  Name: [name] MRN: [id number] Date of Birth: [birth year]  Today's Date: 01/11/2021 Time: 2426-8341 Time Calculation (min): 38 min  Subjective  Subjective Assessment Subjective: R wrist restraint. Pt trying to hold onto the straps to his restraint to avoid being restrained again. RN assisted in getting pt's RUE secured at end of session as pt not cooperating. Patient and Family Stated Goals: None stated throughout session. Date of Onset:  (Unknown) Prior Treatments:  (Dressing changes)  Pain Score:  Pt tolerated treatment well with no complaints of pain throughout.   Wound Assessment  Pressure Injury 12/21/20 Buttocks Right Deep Tissue Pressure Injury - Purple or maroon localized area of discolored intact skin or blood-filled blister due to damage of underlying soft tissue from pressure and/or shear. purple with blister area that has  (Active)  Dressing Type Barrier Film (skin prep);Foam - Lift dressing to assess site every shift;Gauze (Comment);Moist to moist 01/11/21 1419  Dressing Changed;Clean;Dry;Intact 01/11/21 1419  Dressing Change Frequency Daily 01/11/21 1419  State of Healing Non-healing 01/11/21 1419  Site / Wound Assessment Pink;Yellow;Black 01/11/21 1419  % Wound base Red or Granulating 25% 01/11/21 1419  % Wound base Yellow/Fibrinous Exudate 70% 01/11/21 1419  % Wound base Black/Eschar 5% 01/11/21 1419  % Wound base Other/Granulation Tissue (Comment) 0% 01/11/21 1419  Peri-wound Assessment Intact;Erythema (blanchable);Excoriated;Other (Comment) 01/11/21 1419  Wound Length (cm) 11 cm 01/08/21 1100  Wound Width (cm) 7 cm 01/08/21 1100  Wound Depth (cm) 4.8 cm 01/08/21 1100  Wound Surface Area (cm^2) 77 cm^2 01/08/21 1100  Wound Volume (cm^3) 369.6 cm^3 01/08/21 1100  Tunneling (cm) 0 01/08/21 1400  Undermining (cm) 0 01/08/21 1400  Margins Unattached edges (unapproximated) 01/11/21 1419  Drainage Amount Minimal  01/11/21 1419  Drainage Description Serosanguineous;Green 01/11/21 1419  Treatment Debridement (Selective);Hydrotherapy (Pulse lavage);Packing (Saline gauze) 01/11/21 1419      Hydrotherapy Pulsed lavage therapy - wound location: R buttock Pulsed Lavage with Suction (psi): 12 psi Pulsed Lavage with Suction - Normal Saline Used: 1000 mL Pulsed Lavage Tip: Tip with splash shield Selective Debridement Selective Debridement - Location: R buttock Selective Debridement - Tools Used: Forceps,Scalpel,Scissors Selective Debridement - Tissue Removed: unviable tissue    Wound Assessment and Plan  Wound Therapy - Assess/Plan/Recommendations Wound Therapy - Clinical Statement: Noted drainage blue/green this session but overall cleaning up nicely. Reached out to [place] regarding addition of Dakin's. This pt wil benefit from continued hydrotherapy for selective debridement of unviable tissue, to decrease bioburden and promote wound bed healing. Wound Therapy - Functional Problem List: Global weakness in the setting of critical illness and extended ICU stay. Factors Delaying/Impairing Wound Healing: Immobility,Multiple medical problems,Polypharmacy,Infection - systemic/local Hydrotherapy Plan: Dressing change,Patient/family education,Pulsatile lavage with suction,Debridement Wound Therapy - Frequency: 3X / week (hydrotherapy MWF with nursing to continue with daily dressing changes in between.) Wound Therapy - Follow Up Recommendations: dressing changes by RN  Wound Therapy Goals- Improve the function of patient's integumentary system by progressing the wound(s) through the phases of wound healing (inflammation - proliferation - remodeling) by: Wound Therapy Goals - Improve the function of patient's integumentary system by progressing the wound(s) through the phases of wound healing by: Decrease Necrotic Tissue to: 25% Decrease Necrotic Tissue - Progress: Progressing toward goal Increase  Granulation Tissue to: 75% Increase Granulation Tissue - Progress: Progressing toward goal Improve Drainage Characteristics: Min,Serous Improve Drainage Characteristics - Progress: Progressing toward goal Goals/treatment plan/discharge plan were made with and agreed upon by patient/family: No,  Patient unable to participate in goals/treatment/discharge plan and family unavailable Time For Goal Achievement: 7 days Wound Therapy - Potential for Goals: Good  Goals will be updated until maximal potential achieved or discharge criteria met.  Discharge criteria: when goals achieved, discharge from hospital, MD decision/surgical intervention, no progress towards goals, refusal/missing three consecutive treatments without notification or medical reason.  GP     Charges PT Wound Care Charges $Wound Debridement up to 20 cm: < or equal to 20 cm $ Wound Debridement each add'l 20 sqcm: 2 $PT PLS Gun and Tip: 1 Supply $PT Hydrotherapy Visit: 1 Visit       Thelma Comp 01/11/2021, 2:28 PM   Rolinda Roan, PT, DPT Acute Rehabilitation Services Pager: [phone] Office: [phone]

## 2021-01-11 NOTE — Progress Notes (Signed)
SLP Cancellation Note  Patient Details Name: [name] MRN: [id number] DOB: Mar 27, 1961   Cancelled treatment:       Reason Eval/Treat Not Completed: Patient's level of consciousness. Pt sleeping well after getting cleaned up. Full uneaten tray at bedside from lunch.    Adhira Jamil, Riley Nearing 01/11/2021, 2:20 PM

## 2021-01-11 NOTE — Progress Notes (Signed)
Physical Therapy Treatment Patient Details Name: [name] MRN: [id number] DOB: 20-Apr-1961 Today's Date: 01/11/2021    History of Present Illness 60 year old [name] male who presented to the emergency room from home 12/10/20.  Pt had 4-5 days of N/V/D. Pt became more confused and at times belligerent. Admitted for hypoxemic respiratory failure and septic shock 2/2 E. Coli bacteremia and UTI and was intubated. Extubated 4/16, now on cortrak    PT Comments    Pt admitted with above diagnosis. Pt was able to sit EOB with min guard assist. Attempts to stand with pt unable and pt with orthostasis as he c/o dizziness and laid pt down quickly with BP 103/70 once in supine. Pt nurse in room.  Got pt comfortable and pt improved.  Will continue to follow acutely. Pt currently with functional limitations due to balance and endurance deficits. Pt will benefit from skilled PT to increase their independence and safety with mobility to allow discharge to the venue listed below.     Follow Up Recommendations  LTACH     Equipment Recommendations  None recommended by PT    Recommendations for Other Services       Precautions / Restrictions Precautions Precautions: Fall;Other (comment) Precaution Comments: cortrak, condom catheter Restrictions Weight Bearing Restrictions: No    Mobility  Bed Mobility Overal bed mobility: Needs Assistance Bed Mobility: Supine to Sit;Rolling Rolling: Max assist   Supine to sit: +2 for physical assistance;HOB elevated;Max assist Sit to supine: Max assist;+2 for physical assistance;+2 for safety/equipment;HOB elevated;Total assist   General bed mobility comments: Max assist to elevate trunk to sit EOB.  Incr time for pt to get his balance once at EOB.  Total assist to lie down as pt was dizzy.    Transfers Overall transfer level: Needs assistance   Transfers: Sit to/from Stand Sit to Stand: Max assist;+2 physical assistance;From elevated surface          General transfer comment: Attempted to stand pt x 2 to [facility] with pt barely clearing buttocks off bed.  Pt then c/o dizziness and once badck in supine, BP was 103/70.  Pt most likely orthostatic.  Ambulation/Gait             General Gait Details: unable   Stairs             Wheelchair Mobility    Modified Rankin (Stroke Patients Only)       Balance Overall balance assessment: Needs assistance Sitting-balance support: Bilateral upper extremity supported;Feet supported Sitting balance-Leahy Scale: Fair Sitting balance - Comments: able to sit EOB with min guard assist.                                    Cognition Arousal/Alertness: Awake/alert Behavior During Therapy: Restless Overall Cognitive Status: Impaired/Different from baseline                                 General Comments: talking today.  Wanted to get OOB. Some confusion at times per nurse      Exercises General Exercises - Lower Extremity Ankle Circles/Pumps: AROM;Both;10 reps;Seated Long Arc Quad: AROM;Both;10 reps;Seated Other Exercises Other Exercises: RUE shoulder flexion, elbow flexion, extension, finger flexion/extension all AAROM to PROM due to lethargy    General Comments        Pertinent Vitals/Pain Pain Assessment: Faces Faces Pain Scale: Hurts  worst Pain Location: rt buttock Pain Descriptors / Indicators: Restless;Grimacing Pain Intervention(s): Limited activity within patient's tolerance;Monitored during session;Repositioned    Home Living                      Prior Function            PT Goals (current goals can now be found in the care plan section) Acute Rehab PT Goals Patient Stated Goal: unable, but does some yes no nodding Progress towards PT goals: Progressing toward goals    Frequency    Min 2X/week      PT Plan Current plan remains appropriate    Co-evaluation              AM-PAC PT "6 Clicks" Mobility    Outcome Measure  Help needed turning from your back to your side while in a flat bed without using bedrails?: A Little Help needed moving from lying on your back to sitting on the side of a flat bed without using bedrails?: A Lot Help needed moving to and from a bed to a chair (including a wheelchair)?: Total Help needed standing up from a chair using your arms (e.g., wheelchair or bedside chair)?: Total Help needed to walk in hospital room?: Total Help needed climbing 3-5 steps with a railing? : Total 6 Click Score: 9    End of Session Equipment Utilized During Treatment: Gait belt Activity Tolerance: Patient limited by fatigue Patient left: with call bell/phone within reach;in bed;with bed alarm set;with nursing/sitter in room;with restraints reapplied Nurse Communication: Mobility status;Need for lift equipment PT Visit Diagnosis: Muscle weakness (generalized) (M62.81);Difficulty in walking, not elsewhere classified (R26.2);Other abnormalities of gait and mobility (R26.89)     Time: 2637-8588 PT Time Calculation (min) (ACUTE ONLY): 30 min  Charges:  $Therapeutic Exercise: 8-22 mins $Therapeutic Activity: 8-22 mins                     Talyia Allende M,PT Acute Rehab Services [phone] (pager)   [name] 01/11/2021, 3:38 PM

## 2021-01-11 NOTE — Progress Notes (Signed)
PHARMACY CONSULT NOTE FOR:  OUTPATIENT  PARENTERAL ANTIBIOTIC THERAPY (OPAT)  Indication: MRSE bacteremia and septic thrombophlebitis  Regimen: Daptomycin 900 mg IV daily  End date: 02/14/2021  IV antibiotic discharge orders are pended. To discharging provider:  please sign these orders via discharge navigator,  Select New Orders & click on the button choice - Manage This Unsigned Work.     Thank you for allowing pharmacy to be a part of this patient's care.  Jeannetta Nap 01/11/2021, 1:15 PM

## 2021-01-11 NOTE — TOC Initial Note (Signed)
Transition of Care [facility]) - Initial/Assessment Note    Patient Details  Name: [name] MRN: [id number] Date of Birth: 1961-01-29  Transition of Care [facility]) CM/SW Contact:    Janae Bridgeman, RN Phone Number: 01/11/2021, 1:11 PM  Clinical Narrative:                 Case management called and spoke with the patient's wife, Aurther Loft, on the phone to discuss plans for transitions of care and possible need for [facility] placement in order to continue IV antibiotics, wound care and therapy once the patient is medically stable to discharge to a nursing facility for SNF placement.  The patient's wife was concerned about patient's condition and I explained that the patient was not medically ready to discharge to a facility at this time, but discharge planning is started early with patient's with complex medical needs and that patient would most likely need SNF placement for rehabilitation, wound care and IV antibiotic therapy through 02/15/2021 at this time.  The wife was agreeable to [facility] work up at this time.  SNf work up was started by Edwin Dada, MSW.  The patient currently has Cortrak to provide nutrition, restraints to provide lines and tubes, foley for assessment of intake / output, and hydrotherapy for Left buttocks wound.  SNF work up was started but barriers to bed offers and placement include Cortrak for nutrition, lack of insurance, hydrotherapy of buttocks wound, patient unvaccinated for COVID, and need for IV antibiotics at the facility.  I spoke with the wife, Aurther Loft and she declined COVID vaccine and states that the patient has refused this COVID in the past and she does not want him to receive it in the hospital or at a later date.  CM and MSW will continue to follow the patient for transitions of care needs.  Expected Discharge Plan: Skilled Nursing Facility Barriers to Discharge: Continued Medical Work up,Inadequate or no insurance,SNF Pending payor source - LOG   Patient Goals and  CMS Choice Patient states their goals for this hospitalization and ongoing recovery are:: Patient is confused - patient's wife is agreeable to [facility] placement. CMS Medicare.gov Compare Post Acute Care list provided to:: Patient Represenative (must comment) Choice offered to / list presented to : Spouse  Expected Discharge Plan and Services Expected Discharge Plan: Skilled Nursing Facility In-house Referral: Clinical Social Work Discharge Planning Services: CM Consult Post Acute Care Choice: Skilled Nursing Facility Living arrangements for the past 2 months: Single Family Home                                      Prior Living Arrangements/Services Living arrangements for the past 2 months: Single Family Home Lives with:: Spouse Patient language and need for interpreter reviewed:: Yes Do you feel safe going back to the place where you live?: Yes      Need for Family Participation in Patient Care: Yes (Comment) Care giver support system in place?: Yes (comment)   Criminal Activity/Legal Involvement Pertinent to Current Situation/Hospitalization: No - Comment as needed  Activities of Daily Living      Permission Sought/Granted Permission sought to share information with : Case Manager,Family Supports,Facility Contact Representative Permission granted to share information with : Yes, Verbal Permission Granted     Permission granted to share info w AGENCY: SNF facilities  Permission granted to share info w Relationship: Corderro Koloski, wife - [phone]  Emotional Assessment Appearance:: Appears stated age Attitude/Demeanor/Rapport: Unable to Assess (Patient confused.) Affect (typically observed): Unable to Assess Orientation: :  (patient confused, restrained at this time to protect Cortrak and IV placement) Alcohol / Substance Use: Not Applicable Psych Involvement: No (comment)  Admission diagnosis:  Acute respiratory failure (HCC) [J96.00] Sepsis with acute renal  failure and septic shock, due to unspecified organism, unspecified acute renal failure type (HCC) [A41.9, R65.21, N17.9] Patient Active Problem List   Diagnosis Date Noted  . Pressure injury of skin 01/04/2021  . Staphylococcus epidermidis bacteremia 12/25/2020  . 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 AKI (acute kidney injury) (HCC)   . Bacteremia due to Escherichia coli   . Ureteral stone   . Pyelonephritis   . Acute respiratory failure (HCC) 12/10/2020  . Sepsis with acute renal failure and septic shock (HCC)    PCP:  Pcp, No Pharmacy:   [facility] [id number] [facility] [place], [place] - [address] [place] [address] Phone: [phone] Fax: [phone]     Social Determinants of Health (SDOH) Interventions    Readmission Risk Interventions Readmission Risk Prevention Plan 01/11/2021  Transportation Screening Complete  PCP or Specialist Appt within 3-5 Days Complete  HRI or Home Care Consult Complete  Social Work Consult for Recovery Care Planning/Counseling Complete  Palliative Care Screening Complete  Medication Review Oceanographer) Complete

## 2021-01-12 LAB — GLUCOSE, CAPILLARY
Glucose-Capillary: 106 mg/dL — ABNORMAL HIGH (ref 70–99)
Glucose-Capillary: 124 mg/dL — ABNORMAL HIGH (ref 70–99)
Glucose-Capillary: 132 mg/dL — ABNORMAL HIGH (ref 70–99)
Glucose-Capillary: 107 mg/dL — ABNORMAL HIGH (ref 70–99)
Glucose-Capillary: 59 mg/dL — ABNORMAL LOW (ref 70–99)
Glucose-Capillary: 131 mg/dL — ABNORMAL HIGH (ref 70–99)
Glucose-Capillary: 138 mg/dL — ABNORMAL HIGH (ref 70–99)

## 2021-01-12 MED ORDER — SODIUM CHLORIDE 0.9 % IV SOLN
9.0000 mg/kg | Freq: Every day | INTRAVENOUS | Status: DC
  Administered 2021-01-13 – 2021-02-01 (×20): 900 mg via INTRAVENOUS
  Filled 2021-01-12 (×21): qty 18

## 2021-01-12 MED ORDER — ADULT MULTIVITAMIN W/MINERALS CH
1.0000 | ORAL_TABLET | Freq: Every day | ORAL | Status: DC
  Administered 2021-01-12 – 2021-02-02 (×22): 1 via ORAL
  Filled 2021-01-12 (×22): qty 1

## 2021-01-12 MED ORDER — OSMOLITE 1.5 CAL PO LIQD
900.0000 mL | ORAL | Status: DC
  Administered 2021-01-12: 900 mL
  Filled 2021-01-12 (×2): qty 948

## 2021-01-12 NOTE — Progress Notes (Signed)
Nutrition Follow-up  DOCUMENTATION CODES:   Obesity unspecified  INTERVENTION:  -Calorie count per MD -Magic cup TID with meals, each supplement provides 290 kcal and 9 grams of protein -MVI with minerals daily  Transition to nocturnal TF via Cortrak: -Provide Osmolite 1.5 @ 85ml/hr for 12 hours from 1800-0600 ( ) -56ml Prosource TF QID -free water per MD, currently Q4H -juven BID per tube  Nocturnal TF provides 1670 kcals, 144g protein, free water ( total free water with flushes) Meets 77% and 83% minimum estimated calorie and protein needs, respectively   NUTRITION DIAGNOSIS:   Increased nutrient needs related to wound healing as evidenced by estimated needs.  ongoing  GOAL:   Patient will meet greater than or equal to 90% of their needs  progressing  MONITOR:   TF tolerance,Labs,Diet advancement,PO intake  REASON FOR ASSESSMENT:   Ventilator,Consult Enteral/tube feeding initiation and management  ASSESSMENT:   60 yo male admitted with acute metabolic encephalopathy, acute respiratory failure, septic shock, E coli bacteremia, E coli UTI. PMH includes recent severe diarrhea and vomiting, chronic pain, HTN, HLD.  3/31 - intubated 4/5 - extubated then immediately reintubated d/t secretions  4/6 - s/p ureteral stent placement 4/16 - extubated 4/18 - cortrak placed (gastric tip) 4/21 - dysphagia 2 diet with honey thick liquids ordered s/p BSE 4/22 - tx to [facility] 4/24 - blood cultures w/ staph epidermidis bacteremia/gram-negative rods bacteremia (ID following), PICC removed; off O2; diet downgraded to dysphagia 1 with honey thick liquids s/p BSE 4/25 - PCCM signed off 5/1 diet advanced to dysphagia 2 with nectar thick liquids  Diet advanced to dysphagia 3 with nectar thick liquids today. Per NT, pt has been doing well with meals and suspects pt will do better now that diet has been advanced further. RD consulted to conduct calorie count. In  order to optimize po intake during the day, will transition pt to nocturnal TF via Cortrak (previously tolerating Osmolite 1.2 @ 79ml/hr with 34ml Prosource TF QID and free water Q4H). New orders placed and discussed with RN.   UOP: x24 hours  Medications: colace, SSI Q4H, 8 units novolog Q4H, 35 units levemir BID, juven BID per tube, miralax, vitamin B12 Labs: [phone]  Diet Order:   Diet Order            DIET DYS 3 Room service appropriate? Yes; Fluid consistency: Nectar Thick  Diet effective now                 EDUCATION NEEDS:   Not appropriate for education at this time  Skin:  Skin Assessment: Skin Integrity Issues: Skin Integrity Issues:: Stage III Stage III: R buttocks Unstageable: DTPI to the left foot and sacral area Incisions: penis  Last BM:  5/2  Height:   Ht Readings from Last 1 Encounters:  12/25/20 6\' 2"  (1.88 m)    Weight:   Wt Readings from Last 1 Encounters:  01/09/21 132 kg    Ideal Body Weight:  86.4 kg  BMI:  Body mass index is 37.36 kg/m.  Estimated Nutritional Needs:   Kcal:  2160-2590  Protein:  173-216 gm  Fluid:  >/= 2 L    05-22-1998, MS, RD, LDN RD pager number and weekend/on-call pager number located in Amion.

## 2021-01-12 NOTE — TOC Progression Note (Signed)
Transition of Care [facility]) - Progression Note    Patient Details  Name: [name] MRN: [id number] Date of Birth: 1961/01/27  Transition of Care [facility]) CM/SW Contact  Curlene Labrum, RN Phone Number: 01/12/2021, 2:21 PM  Clinical Narrative:    Case management met with the patient at the bedside to discuss transitions of care.  The patient currently has Cortrak in place and right arm restraint to protect Cortrak and IV at this time.  The patient is unable to use his Left arm.  Plan is to continue to follow for medical treatments, including Pt/OT therapy, Cortrak nutrition, and IV antibiotics.  The patient is able to answer questions today and less confused compared to yesterday's physical assessment.  Palliative care plans to meet with the patient and wife tomorrow.  CM and MSW will continue to follow the patient for SNF placement through an LOG, since patient will need IV antibiotics through 02/15/2021.   Expected Discharge Plan: Skilled Nursing Facility Barriers to Discharge: Continued Medical Work up,Inadequate or no insurance,SNF Pending payor source - LOG  Expected Discharge Plan and Services Expected Discharge Plan: [place] In-house Referral: Clinical Social Work Discharge Planning Services: CM Consult Post Acute Care Choice: [place] arrangements for the past 2 months: Single Family Home                                       Social Determinants of Health (SDOH) Interventions    Readmission Risk Interventions Readmission Risk Prevention Plan 01/11/2021  Transportation Screening Complete  PCP or Specialist Appt within 3-5 Days Complete  HRI or [place] Complete  Social Work Consult for [place] Planning/Counseling Complete  Palliative Care Screening Complete  Medication Review Press photographer) Complete

## 2021-01-12 NOTE — Progress Notes (Signed)
Palliative Care Progress Note  60 yo man with DM2, Kidney Stones and HTN admitted with severe sepsis from pyelonephritis and urinary tract obstruction with complicated hospitalization including prolonged hospitalization/ICU, bacteremia and a stroke.  Mr. Kock is able to communicate with me today- compared to last week this is much improved. He knows he is at [facility]. He does know any details of why he is here, or how sick he is. He wants his restraint removed from his right wrist but RN reports that he has pulled multiple IVs and still gets confused. He is able to raise his left arm but has no use of his left hand. He asks me why he cant move his left hand. He also has a complicated decubitus sacral ulcer requiring advanced wound care and hydrotherapy.  He denies pain today. He is able to drink thickened liquids and RN reports he has no problem doing this but has much more difficulty with food that requires chewing and manipulation to swallow. He still has a cortrak in place because he is likely not meeting his nutritional requirements with PO intake alone. He is able to sit on the side of the bed with minimal assist but is very deconditioned and had postural hypotension limiting therapy.  Recommendations:  1. 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 His nutritional intake is also a barrier to his recovery. His wife is going to be at the hospital on Wednesday and I will plan on meeting her in person to discuss his care. Given his current condition after 30+days I doubt he will return to even close to his baseline level of health and will have significant care needs in the future although he is young and may have a higher likelyhood of making progress and achieving some level of QOL.    2. He has chronic pain at baseline which he is able to tell me about in detail related to his back-he was on long standing opioid treatment prior to admission and followed by a pain specialist at [facility]. He remains on both scheduled oxycodone and tylenol. He is very diaphoretic and question is scheduled tylenol may be masking a fever- will hold and monitor his temp trends.  3. He would be much more comfortable if we could remove the cortak and his wrist restraints. His saline lock is being used for IV abx only. He is going to need 6 weeks of IV abx- ?more stable access but also reviewed hx with IJ and [place] DVT and vascular infection.  4. Having issues with PM agitation and not sleeping well at night-Increased his seroquel to 50mg  and added trial of Ambien for sleep. Will need to be vigilant about delirium precautions and try to allow for uninterrupted sleep.  5. He is requiring hydrotherapy and advanced wound care- ok to give PRN pain meds prior to this procedure.  Will follow for palliative care needs and meet with his wife on Wednesday.  Sunday, DO Palliative Medicine [phone]  Time: 35 min Greater than 50%  of this time was spent counseling and coordinating care related to the above assessment and plan.

## 2021-01-12 NOTE — Progress Notes (Signed)
PT Note Left message on phone with wife to call office to set up family education so that wife can see how much care pt is requiring.  Await wife's response.  Thanks.  Adeliz Tonkinson M,PT Acute Rehab Services [phone] (pager)

## 2021-01-12 NOTE — Progress Notes (Signed)
  Speech Language Pathology Treatment: Dysphagia  Patient Details Name: [name] MRN: [id number] DOB: January 06, 1961 Today's Date: 01/12/2021 Time: 2876-8115 SLP Time Calculation (min) (ACUTE ONLY): 30 min  Assessment / Plan / Recommendation Clinical Impression  Followed up for dysphagia intervention. Pt pleasant and able to help repositioning self in the bed. Pt eager for POs, requesting water. Provided diligent oral care, some xerostomia exhibited, improving post oral care. Pt was impulsive with rate of consumption with thin liquids and exhibited overt coughing in 1/5 trials concerning for intermittent reduced airway protection. SLP reinforced safe swallowing including small sips and bites. No overt s/sx of aspiration nectar thick or regular consistencies trialed. Minimal solid residuals exhibited. Pt instructed for use of effortful swallow and was able to implement with mild cueing. Recommend diet advancement to dysphagia 3 (mechanical soft) and continue nectar (mildly thick) liquids. Pt excellent candidate for ice chips and water in between meals following diligent oral care. Pt and RN in agreement with plan, signs posted at [facility].    HPI HPI: Pt is a 60 year old male who presented 3/31 to the ED with 4-5 day hx of diarrhea and vomiting. He developed confusion and became belligerent at home. Initial ED evaluation found him to be hypotensive, combative, confused, and he was intubated.  He was noted to have mottling over his trunk and extremities and was  admitted for hypoxemic respiratory failure and septic shock 2/2 E. Coli bacteremia and UTI, and ATN. ETT 3/31-4/5; reintubated 4/5-4/16. 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 CXR 4/14: Worsening pneumonia in both lungs, most confluent at the RIGHT  lung base. CXR 4/22: Mild residual airspace disease in the right mid to lower lung.  Aeration slightly improved. No new  airspace opacity      SLP Plan  Continue with current plan of care       Recommendations  Diet recommendations: Dysphagia 3 (mechanical soft);Nectar-thick liquid;Other(comment) (water in between meals following diligent oral care) Liquids provided via: Cup Medication Administration: Whole meds with puree Supervision: Intermittent supervision to cue for compensatory strategies;Patient able to self feed Compensations: Slow rate;Small sips/bites;Minimize environmental distractions;Clear throat intermittently;Effortful swallow Postural Changes and/or Swallow Maneuvers: Seated upright 90 degrees;Upright 30-60 min after meal                Oral Care Recommendations: Oral care BID;Oral care prior to ice chip/H20 Follow up Recommendations: [facility] SLP Visit Diagnosis: Dysphagia, oropharyngeal phase (R13.12) Plan: Continue with current plan of care       GO                Ardyth Gal MA, CCC-SLP Acute Rehabilitation Services   01/12/2021, 11:08 AM

## 2021-01-12 NOTE — Progress Notes (Signed)
[facility] PROGRESS NOTE  [name] KGS:[id number] DOB: December 23, 1960 DOA: 12/10/2020 PCP: Pcp, No  Status:  Remains inpatient appropriate because:Altered mental status, Unsafe d/c plan, IV treatments appropriate due to intensity of illness or inability to take PO and Inpatient level of care appropriate due to severity of illness   Dispo: The patient is from: Home              Anticipated d/c is to: SNF              Patient currently is not medically stable to d/c.   Difficult to place patient Yes                Level of care: Progressive  Code Status: Full Family Communication: Patient DVT prophylaxis: Lovenox Vaccination status: Unvaccinated-CM spoke with wife who states that for political reasons patient has refused COVID vaccination  Foley catheter: Foley catheter inserted on April 26  HPI: 60 year old male with past medical history significant for essential hypertension, hyperlipidemia who presented to [facility] ED on 3/31 for a 5-day history of nausea/vomiting/diarrhea and confusion.  Patient was initially admitted to [facility] service for acute hypoxic respiratory failure requiring intubation and ventilatory support with septic shock secondary to E. coli septicemia, UTI, MSSA Pneumonia, MRSE bacteremia (CLABSI involving left IJ vein and subclavian vein), with possible endovascular infection.  Infectious disease followed during hospital course.  Patient transferred from [facility] to [facility] on 01/01/2021 for further management of ongoing metabolic encephalopathy, tachycardia, dysphagia requiring tube feeds, intermittent fevers and prolonged antibiotic course.  Procedures/significant Hospital events:   Intubation 3/31  Triple-lumen central line placement 3/31  4/1 off pressors. E coli bacteremia, e coli uti. Some improvement in pulm opacities on CXR. On rocephin. TTE with LVEF 40-45%, LV global hypokinesis, G2DD, normal RV, dilated aortic root at 105m.  Bronchoscopy with BAL  4/5, Dr. STamala Julian 4/5 extubated, failed immediately due to mucous plugging identified on bronch after re-intubated. CT ABD with 436mleft ureteral stone with mild edema of left kideny, large areas of consolidation in bilateral lung bases.   4/6 urology consulted for distal left ureteral stone, to OR overnight for stent placement, UCx sent. Versed added for agitation (on fent / precedex)  4/7 Off vasopressors  4/11 Vasopressors restarted   4/13: Left IJ holiday  4/15: Right IJ placed   4/16: extubated, remained agitated and restless requiring IV Precedex infusion  4/18: Cortrak tube insertion   Antimicrobials:   Daptomycin 4/26>> (End 6/5)  Cefazolin 4/4 - 4/6, 4/28>> (End 5/4)  Metronidazole 4/27>> (End 5/4)  Cefepime 4/6 - 4/7  Ceftriaxone 3/31 - 3/31, 4/7 - 4/11, 4/14 - 4/15  Vancomycin 3/31 - 3/31, 4/14 - 4/24, 4/26 - 4/28  Zosyn 3/31 - 3/31    Subjective: Alert and not as agitated today.  Appears to be more appropriate during conversation.  Asking appropriate questions and interacting better.  Was able to participate in neurological exam today.  Objective: Vitals:   01/11/21 2315 01/12/21 0310  BP: 118/65 127/80  Pulse: (!) 108 (!) 103  Resp: (!) 26 (!) 22  Temp: 98.9 F (37.2 C) 98.9 F (37.2 C)  SpO2: 98% 96%    Intake/Output Summary (Last 24 hours) at 01/12/2021 0728 Last data filed at 01/12/2021 0655 Gross per 24 hour  Intake 3838.5 ml  Output 3275 ml  Net 563.5 ml   Filed Weights   12/29/20 0400 01/03/21 0500 01/09/21 0500  Weight: 126.7 kg 120.6 kg 132 kg  Exam:  Constitutional: Alert, calm, no acute distress Respiratory: Anterior lungs are clear to auscultation, no increased work of breathing, room air Cardiovascular: Normal heart sounds, regular pulse, no peripheral edema Abdomen: Cortrak tube in place- LBM 5/2, normoactive bowel sounds.  Tube feeding infusing. Skin: Has stage III decubitus on hip-not visualized on 5/2 Neurologic: Dense  left upper extremity hemiparesis that is also insensate.  Moves all other extremities equally with strength 4/5. Psychiatric: Awake and oriented times name and place.  Not to year.  Pleasant affect.   Assessment/Plan: Acute problems: Acute hypoxic respiratory failure: Resolved MSSA pneumonia --Presented with hypoxia that required intubation associated with sepsis physiology  MRSE endovascular infection/septicemia E. coli UTI/septicemia Septic shock, POA --Infectious disease following, appreciate assistance --Continue daptomycin 900 mg IV daily x 6 weeks from 4/24 (~02/14/21) -- Continue cefazolin 2 g IV every 8 hours x 10 days (End 01/13/21)  --5/2 most recent blood cultures negative  Acute toxic metabolic encephalopathy/ongoing delirium/subacute vs chronic infarct with dense insensate LUE hemiparesis -Has significant dysmotility as well as left upper extremity weakness and mild hemiparesis --continue asa and statin --needs SNF placement   Stage III right buttock decubitus -Initially started as a deep tissue injury and has progressed to decubitus --Continue antibiotics as above, Flagyl 500 mg per tube q12h. --Seen by general surgery 4/28, no need for debridement --Wound care following with recommendations for Santyl-5/3 attending physician ordered 3 days of twice daily Dakin's solution; continue hydrotherapy with PT  Type 2 diabetes mellitus --On glipizide at home.   --Hemoglobin A1c 6.8. --Lantus 35 u BID and Novolog 8 u [place] q4h  Left IJ/subclavian DVT versus septic thrombus -Left IJ central line subsequently discontinued and RUE PICC line removed 4/24   --Repeat ultrasound duplex scan 4/25 with resolution of DVT. --Continue Lovenox therapeutic dose  Essential hypertension --Continue carvedilol 3.125 mg twice daily  Recurrent pyelonephritis with obstructive hydronephrosis --s/p left ureteral stent 4/6.   --Repeat ultrasound with mild left hydronephrosis.  CT abdomen with  stable position of double-J stent. -- Continue Foley catheter  Dysphagia/mild protein calorie malnutrition/morbid obesity -- Continue D2 diet with nectar thick liquid --5/2 transitioned to nocturnal tube feedings and calorie count initiated --Estimated body mass index is 37.36 kg/m as calculated from the following:   Height as of this encounter: _0  (1.88 m).   Weight as of this encounter: 132 kg.  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  Prognosis has been relayed to his spouse on multiple occasions given his overall poor clinical condition.  Remains full code    Other problems: NASH cirrhosis:  Outpatient follow-up with gastroenterology  Mild hyponatremia --Resolved  Hypokalemia --Resolved  Acute renal failure -- Resolved  HLD:  -- Continue atorvastatin 40 mg daily      Data Reviewed: Basic Metabolic Panel: Recent Labs  Lab 01/07/21 0301 01/08/21 0039 01/09/21 0152 01/10/21 0250 01/11/21 0207  NA 146* 144 146* 143 140  K 3.5 3.3* 3.8 4.0 3.6  CL 108 111 111 107 106  CO2 _1 GLUCOSE 131* 170* 134* 153* 88  BUN 54* 53* 44* 38* 34*  CREATININE 0.94 0.88 0.89 0.82 0.77  CALCIUM 8.8* 8.6* 8.6* 8.6* 8.7*   CBC: Recent Labs  Lab 01/05/21 0800 01/06/21 0447 01/07/21 0301 01/08/21 0039 01/09/21 0152 01/10/21 0250 01/11/21 0207  WBC 11.7*   < > 9.7 9.4 8.6 9.1 8.3  NEUTROABS 9.6*  --   --   --   --   --   --   HGB 9.7*   < > 9.0* 8.5* 9.0* 9.0* 8.6*  HCT 30.5*   < > 29.0* 26.9* 28.1* 28.7* 27.4*  MCV 98.4   < > 100.7* 99.3 98.3 99.3 98.6  PLT 186   < > 188 193 220 218 246   < > = values in this  interval not displayed.   Cardiac Enzymes: Recent Labs  Lab 01/06/21 0447  CKTOTAL 129    CBG: Recent Labs  Lab 01/11/21 1144 01/11/21 1547 01/11/21 1918 01/11/21 2315 01/12/21 0311  GLUCAP 115* 86 156* 134* 106*     Studies: No results found.  Scheduled Meds: . aspirin  81 mg Per Tube Daily  . atorvastatin  40 mg Per Tube Daily  . carvedilol  3.125 mg Oral BID WC  . chlorhexidine  15 mL Mouth Rinse BID  . Chlorhexidine Gluconate Cloth  6 each Topical Daily  . [START ON 01/15/2021] collagenase   Topical Daily  . docusate  50 mg Per Tube Daily  . DULoxetine  30 mg Oral Daily  . enoxaparin (LOVENOX) injection  120 mg Subcutaneous Q12H  . feeding supplement (PROSource TF)  90 mL Per Tube QID  . free water  250 mL Per Tube Q4H  . insulin aspart  0-20 Units Subcutaneous Q4H  . insulin aspart  8 Units Subcutaneous Q4H  . insulin detemir  35 Units Subcutaneous BID  . mouth rinse  15 mL Mouth Rinse q12n4p  . metroNIDAZOLE  500 mg Oral Q12H  . nutrition supplement (JUVEN)  1 packet Per Tube BID BM  . oxyCODONE  10 mg Per Tube Q6H  . polyethylene glycol  17 g Per Tube Daily  . QUEtiapine  50 mg Oral QHS  . sodium chloride flush  10-40 mL Intracatheter Q12H  . sodium hypochlorite   Topical BID  . vitamin B-12  100 mcg Per Tube Daily  . zolpidem  10 mg Oral QHS   Continuous Infusions: . sodium chloride Stopped (12/31/20 1020)  .  ceFAZolin (ANCEF) IV 2 g (01/12/21 0158)  . DAPTOmycin (CUBICIN)  IV 900 mg (01/11/21 2100)  . feeding supplement (OSMOLITE 1.2 CAL) 1,000 mL (01/11/21 2140)    Active Problems:   Acute respiratory failure (HCC)   AKI (acute kidney injury) ([place])   Bacteremia due to Escherichia coli   Ureteral stone   Pyelonephritis   Cerebral thrombosis with cerebral infarction   Staphylococcus epidermidis bacteremia   Pressure injury of skin   Dysphagia, pharyngoesophageal phase   Physical deconditioning   Consultants:  PCCM  Urology Dr. Claudia Desanctis, Dr.  Matilde Sprang  Nephrology, Dr. Jonnie Finner, Dr. Augustin Coupe  Neurology, Dr. Leonie Man  Infectious disease    Procedures:  See above    Time spent: 35 minutes    Erin Hearing ANP  [facility] 7 am - 330 pm/M-F for direct patient care and secure chat Please refer to Amion for contact info 33  days

## 2021-01-13 LAB — GLUCOSE, CAPILLARY
Glucose-Capillary: 80 mg/dL (ref 70–99)
Glucose-Capillary: 101 mg/dL — ABNORMAL HIGH (ref 70–99)
Glucose-Capillary: 92 mg/dL (ref 70–99)
Glucose-Capillary: 95 mg/dL (ref 70–99)
Glucose-Capillary: 139 mg/dL — ABNORMAL HIGH (ref 70–99)

## 2021-01-13 LAB — PHOSPHORUS: Phosphorus: 3.1 mg/dL (ref 2.5–4.6)

## 2021-01-13 LAB — CK: Total CK: 36 U/L — ABNORMAL LOW (ref 49–397)

## 2021-01-13 LAB — MAGNESIUM: Magnesium: 1.9 mg/dL (ref 1.7–2.4)

## 2021-01-13 MED ORDER — DOCUSATE SODIUM 50 MG/5ML PO LIQD
50.0000 mg | Freq: Every day | ORAL | Status: DC
  Administered 2021-01-14 – 2021-01-28 (×13): 50 mg via ORAL
  Filled 2021-01-13 (×16): qty 10

## 2021-01-13 MED ORDER — ATORVASTATIN CALCIUM 40 MG PO TABS
40.0000 mg | ORAL_TABLET | Freq: Every day | ORAL | Status: DC
  Administered 2021-01-14 – 2021-02-02 (×20): 40 mg via ORAL
  Filled 2021-01-13 (×20): qty 1

## 2021-01-13 MED ORDER — INSULIN ASPART 100 UNIT/ML IJ SOLN: 0.0000 [IU] | Freq: Every day | INTRAMUSCULAR | Status: DC

## 2021-01-13 MED ORDER — ACETAMINOPHEN 500 MG PO TABS
500.0000 mg | ORAL_TABLET | Freq: Four times a day (QID) | ORAL | Status: DC | PRN
  Administered 2021-01-13 – 2021-01-29 (×6): 500 mg via ORAL
  Filled 2021-01-13 (×7): qty 1

## 2021-01-13 MED ORDER — VITAMIN B-12 100 MCG PO TABS
100.0000 ug | ORAL_TABLET | Freq: Every day | ORAL | Status: DC
  Administered 2021-01-14 – 2021-02-02 (×20): 100 ug via ORAL
  Filled 2021-01-13 (×21): qty 1

## 2021-01-13 MED ORDER — FREE WATER: 250.0000 mL | Status: DC

## 2021-01-13 MED ORDER — OXYCODONE HCL 5 MG PO TABS
10.0000 mg | ORAL_TABLET | Freq: Four times a day (QID) | ORAL | Status: DC
  Administered 2021-01-13 – 2021-01-21 (×32): 10 mg via ORAL
  Filled 2021-01-13 (×32): qty 2

## 2021-01-13 MED ORDER — ASPIRIN 81 MG PO CHEW
81.0000 mg | CHEWABLE_TABLET | Freq: Every day | ORAL | Status: DC
  Administered 2021-01-14 – 2021-02-02 (×20): 81 mg via ORAL
  Filled 2021-01-13 (×20): qty 1

## 2021-01-13 MED ORDER — POLYETHYLENE GLYCOL 3350 17 G PO PACK
17.0000 g | PACK | Freq: Every day | ORAL | Status: DC
  Administered 2021-01-15 – 2021-02-02 (×14): 17 g via ORAL
  Filled 2021-01-13 (×15): qty 1

## 2021-01-13 MED ORDER — INSULIN ASPART 100 UNIT/ML IJ SOLN
0.0000 [IU] | Freq: Three times a day (TID) | INTRAMUSCULAR | Status: DC
  Administered 2021-01-14: 2 [IU] via SUBCUTANEOUS
  Administered 2021-01-17 – 2021-01-19 (×6): 1 [IU] via SUBCUTANEOUS
  Administered 2021-01-20 – 2021-01-21 (×3): 2 [IU] via SUBCUTANEOUS
  Administered 2021-01-21 – 2021-01-22 (×2): 1 [IU] via SUBCUTANEOUS
  Administered 2021-01-23: 2 [IU] via SUBCUTANEOUS
  Administered 2021-01-26 – 2021-02-02 (×5): 1 [IU] via SUBCUTANEOUS

## 2021-01-13 NOTE — Progress Notes (Signed)
Physical Therapy Wound Treatment Patient Details  Name: Amiel Mccaffrey MRN: [id number] Date of Birth: 12-13-1960  Today's Date: 01/13/2021 Time: 7824-2353 Time Calculation (min): 30 min  Subjective  Subjective Assessment Subjective: Pleasant and agreeable. Asking for pain medicine this session. Patient and Family Stated Goals: None stated throughout session. Date of Onset:  (Unknown) Prior Treatments:  (Dressing changes)  Pain Score:  RN provided pain meds during session. Pt did not complain of any pain throughout treatment.  Wound Assessment  Pressure Injury 12/21/20 Buttocks Right Deep Tissue Pressure Injury - Purple or maroon localized area of discolored intact skin or blood-filled blister due to damage of underlying soft tissue from pressure and/or shear. purple with blister area that has  (Active)  Dressing Type ABD;Barrier Film (skin prep);Gauze (Comment);Moist to moist;Dakin's-soaked gauze 01/13/21 1240  Dressing Changed;Clean;Dry;Intact 01/13/21 1240  Dressing Change Frequency Monday, Wednesday, Friday 01/13/21 1240  State of Healing Non-healing 01/13/21 1240  Site / Wound Assessment Pink;Yellow;Black 01/13/21 1240  % Wound base Red or Granulating 40% 01/13/21 1240  % Wound base Yellow/Fibrinous Exudate 60% 01/13/21 1240  % Wound base Black/Eschar 0% 01/13/21 1240  % Wound base Other/Granulation Tissue (Comment) 0% 01/13/21 1240  Peri-wound Assessment Excoriated 01/13/21 1240  Wound Length (cm) 11 cm 01/08/21 1100  Wound Width (cm) 7 cm 01/08/21 1100  Wound Depth (cm) 4.8 cm 01/08/21 1100  Wound Surface Area (cm^2) 77 cm^2 01/08/21 1100  Wound Volume (cm^3) 369.6 cm^3 01/08/21 1100  Tunneling (cm) 0 01/08/21 1400  Undermining (cm) 0 01/08/21 1400  Margins Unattached edges (unapproximated) 01/13/21 1240  Drainage Amount Minimal 01/13/21 1240  Drainage Description Serosanguineous 01/13/21 1240  Treatment Debridement (Selective);Hydrotherapy (Pulse lavage);Packing (Saline  gauze) 01/13/21 1240      Hydrotherapy Pulsed lavage therapy - wound location: R buttock Pulsed Lavage with Suction (psi): 12 psi Pulsed Lavage with Suction - Normal Saline Used: 1000 mL Pulsed Lavage Tip: Tip with splash shield Selective Debridement Selective Debridement - Location: R buttock Selective Debridement - Tools Used: Forceps,Scalpel,Scissors Selective Debridement - Tissue Removed: unviable tissue    Wound Assessment and Plan  Wound Therapy - Assess/Plan/Recommendations Wound Therapy - Clinical Statement: Progressing with debridement. No blue/green drainage noted this session. Pt is on day 2/3 of Dakin's Solution. This pt wil benefit from continued hydrotherapy for selective debridement of unviable tissue, to decrease bioburden and promote wound bed healing. Wound Therapy - Functional Problem List: Global weakness in the setting of critical illness and extended ICU stay. Factors Delaying/Impairing Wound Healing: Immobility,Multiple medical problems,Polypharmacy,Infection - systemic/local Hydrotherapy Plan: Dressing change,Patient/family education,Pulsatile lavage with suction,Debridement Wound Therapy - Frequency: 3X / week (hydrotherapy MWF with nursing to continue with daily dressing changes in between.) Wound Therapy - Follow Up Recommendations: dressing changes by RN  Wound Therapy Goals- Improve the function of patient's integumentary system by progressing the wound(s) through the phases of wound healing (inflammation - proliferation - remodeling) by: Wound Therapy Goals - Improve the function of patient's integumentary system by progressing the wound(s) through the phases of wound healing by: Decrease Necrotic Tissue to: 25% Decrease Necrotic Tissue - Progress: Progressing toward goal Increase Granulation Tissue to: 75% Increase Granulation Tissue - Progress: Progressing toward goal Improve Drainage Characteristics: Min,Serous Improve Drainage Characteristics -  Progress: Progressing toward goal Goals/treatment plan/discharge plan were made with and agreed upon by patient/family: No, Patient unable to participate in goals/treatment/discharge plan and family unavailable Time For Goal Achievement: 7 days Wound Therapy - Potential for Goals: Good  Goals will be updated until  maximal potential achieved or discharge criteria met.  Discharge criteria: when goals achieved, discharge from hospital, MD decision/surgical intervention, no progress towards goals, refusal/missing three consecutive treatments without notification or medical reason.  GP     Charges PT Wound Care Charges $Wound Debridement up to 20 cm: < or equal to 20 cm $ Wound Debridement each add'l 20 sqcm: 2 $PT PLS Gun and Tip: 1 Supply $PT Hydrotherapy Visit: 1 Visit        D  01/13/2021, 12:47 PM   , PT, DPT Acute Rehabilitation Services Pager: [phone] Office: [phone]   

## 2021-01-13 NOTE — Progress Notes (Addendum)
[facility] PROGRESS NOTE  [name] NAT:[id number] DOB: 05/08/1961 DOA: 12/10/2020 PCP: Pcp, No  Status:  Remains inpatient appropriate because:Altered mental status, Unsafe d/c plan, IV treatments appropriate due to intensity of illness or inability to take PO and Inpatient level of care appropriate due to severity of illness   Dispo: The patient is from: Home              Anticipated d/c is to: SNF              Patient currently is not medically stable to d/c.   Difficult to place patient Yes                Level of care: Progressive  Code Status: Full Family Communication: Patient, wife and daughter on 5/4. DVT prophylaxis: Lovenox Vaccination status: Unvaccinated-CM spoke with wife who states that for political reasons patient has refused COVID vaccination  Foley catheter: Foley catheter inserted on April 6-discontinued on 5/4 with plans to utilize condom catheter  HPI: 60 year old male with past medical history significant for essential hypertension, hyperlipidemia who [facility] on 3/31 for a 5-day history of nausea/vomiting/diarrhea and confusion.  Patient was initially admitted to [facility] service for acute hypoxic respiratory failure requiring intubation and ventilatory support with septic shock secondary to E. coli septicemia, UTI, MSSA Pneumonia, MRSE bacteremia (CLABSI involving left IJ vein and subclavian vein), with possible endovascular infection.  Infectious disease followed during hospital course.  [facility] for further management of ongoing metabolic encephalopathy, tachycardia, dysphagia requiring tube feeds, intermittent fevers and prolonged antibiotic course.  Procedures/significant Hospital events:   Intubation 3/31  Triple-lumen central line placement 3/31  4/1 off pressors. E coli bacteremia, e coli uti. Some improvement in pulm opacities on CXR. On rocephin. TTE with LVEF 40-45%, LV global  hypokinesis, G2DD, normal RV, dilated aortic root at 21m.  Bronchoscopy with BAL 4/5, Dr. STamala Julian 4/5 extubated, failed immediately due to mucous plugging identified on bronch after re-intubated. CT ABD with 419mleft ureteral stone with mild edema of left kideny, large areas of consolidation in bilateral lung bases.   4/6 urology consulted for distal left ureteral stone, to OR overnight for stent placement, UCx sent. Versed added for agitation (on fent / precedex)  4/7 Off vasopressors  4/11 Vasopressors restarted   4/13: Left IJ holiday  4/15: Right IJ placed   4/16: extubated, remained agitated and restless requiring IV Precedex infusion  4/18: Cortrak tube insertion and removed 5/4  Antimicrobials:   Daptomycin 4/26>> (End 6/5)  Cefazolin 4/4 - 4/6, 4/28>> (End 5/4)  Metronidazole 4/27>> (End 5/4)  Cefepime 4/6 - 4/7  Ceftriaxone 3/31 - 3/31, 4/7 - 4/11, 4/14 - 4/15  Vancomycin 3/31 - 3/31, 4/14 - 4/24, 4/26 - 4/28  Zosyn 3/31 - 3/31    Subjective: Patient alert and pleasant.  Did complain of dizziness while working with PT at bedside.  Mentation has improved significantly.  Patient remembered who I was.  Wife also at bedside along with patient's daughter.  Updated on status and discharge plans.  Objective: Vitals:   01/12/21 2321 01/13/21 0330  BP: (!) 142/67 132/80  Pulse: 100 (!) 105  Resp: 20 20  Temp: 97.8 F (36.6 C) 98 F (36.7 C)  SpO2: 100% 97%    Intake/Output Summary (Last 24 hours) at 01/13/2021 0752 Last data filed at 01/13/2021 0600 Gross per 24 hour  Intake 5112.5 ml  Output --  Net 5112.5 ml   Filed Weights   12/29/20 0400 01/03/21 0500 01/09/21 0500  Weight: 126.7 kg 120.6 kg 132 kg    Exam:  Constitutional: Awake, calm, no acute distress although did report dizziness while sitting up working with PT Respiratory: Lungs are clear, room air Cardiovascular: Normal heart sounds, no tachycardia, was not orthostatic when checked.   Extremities warm to touch Abdomen: Cortrak tube in place- LBM 5/2, normoactive bowel sounds.  Abdomen nontender.  Nocturnal tube feedings. Skin: Has stage III decubitus on hip-not visualized on 5/2 Neurologic: Dense left upper extremity hemiparesis that is also insensate.  Moves all other extremities equally with strength 4/5. Psychiatric: Alert and oriented x3.  Pleasant affect.   Assessment/Plan: Acute problems: Acute hypoxic respiratory failure: Resolved MSSA pneumonia --Presented with hypoxia that required intubation associated with sepsis physiology  MRSE endovascular infection/septicemia E. coli UTI/septicemia Septic shock, POA --Infectious disease following, appreciate assistance --Continue daptomycin 900 mg IV daily x 6 weeks from 4/24 (~02/14/21) *5/4 discussed with ID Dr. Montel Culver who recommended continuing IV antibiotics for 3 to 4 weeks before discussing potential transition to other options. --Completed cefazolin 01/13/21 --5/2 most recent blood cultures negative  Acute toxic metabolic encephalopathy/ongoing delirium/subacute vs chronic infarct with dense insensate LUE hemiparesis -Has significant dysmotility as well as left upper extremity weakness and mild hemiparesis --continue asa and statin --needs SNF placement --Continues to complain of significant dizziness while working with PT and transitioning from upright position.  Intermittently orthostatic.  No indication to initiate midodrine at this point.  5/4 have ordered Kreg tilt bed  Stage III right buttock decubitus -Initially started as a deep tissue injury and has progressed to decubitus --Continue antibiotics as above, Flagyl 500 mg per tube q12h. --Seen by general surgery 4/28, no need for debridement --Wound care following with recommendations for Santyl -5/3 attending physician ordered 3 days of twice daily Dakin's solution; continue hydrotherapy with PT  Type 2 diabetes mellitus --On glipizide at home.    --Hemoglobin A1c 6.8. --Lantus 35 u BID -off TFs so dc Novolog 8 u Candor q4h  Left IJ/subclavian DVT versus septic thrombus -Left IJ central line subsequently discontinued and RUE PICC line removed 4/24   --Repeat ultrasound duplex scan 4/25 with resolution of DVT. --Continue Lovenox therapeutic dose  Essential hypertension --Continue carvedilol 3.125 mg twice daily  Recurrent pyelonephritis with obstructive hydronephrosis --s/p left ureteral stent 4/6.   --Repeat ultrasound with mild left hydronephrosis.  CT abdomen with stable position of double-J stent. -- Continue Foley catheter  Dysphagia/mild protein calorie malnutrition/morbid obesity -- Continue D3 diet with nectar thick liquid-Magic Cup --5/2 transitioned to nocturnal tube feedings and calorie count initiated-has done well with recommendation to discontinue tube therefore tube removed on 5/4 --Estimated body mass index is 37.36 kg/m as calculated from the following:   Height as of this encounter: _0  (1.88 m).   Weight as of this encounter: 132 kg.  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  Prognosis has been relayed to his spouse on multiple occasions given his overall poor clinical condition.  Remains full code    Other problems: NASH cirrhosis:  Outpatient follow-up with gastroenterology  Mild hyponatremia --Resolved  Hypokalemia --Resolved  Acute renal failure -- Resolved  HLD:  --  Continue atorvastatin 40 mg daily      Data Reviewed: Basic Metabolic Panel: Recent Labs  Lab 01/07/21 0301 01/08/21 0039 01/09/21 0152 01/10/21 0250 01/11/21 0207  NA  146* 144 146* 143 140  K 3.5 3.3* 3.8 4.0 3.6  CL 108 111 111 107 106  CO2 _0 GLUCOSE 131* 170* 134* 153* 88  BUN 54* 53* 44* 38* 34*  CREATININE 0.94 0.88 0.89 0.82 0.77  CALCIUM 8.8* 8.6* 8.6* 8.6* 8.7*   CBC: Recent Labs  Lab 01/07/21 0301 01/08/21 0039 01/09/21 0152 01/10/21 0250 01/11/21 0207  WBC 9.7 9.4 8.6 9.1 8.3  HGB 9.0* 8.5* 9.0* 9.0* 8.6*  HCT 29.0* 26.9* 28.1* 28.7* 27.4*  MCV 100.7* 99.3 98.3 99.3 98.6  PLT 188 193 220 218 246   Cardiac Enzymes: Recent Labs  Lab 01/13/21 0432  CKTOTAL 36*    CBG: Recent Labs  Lab 01/12/21 1622 01/12/21 2121 01/12/21 2320 01/13/21 0331 01/13/21 0722  GLUCAP 107* 131* 138* 80 101*     Studies: No results found.  Scheduled Meds: . aspirin  81 mg Per Tube Daily  . atorvastatin  40 mg Per Tube Daily  . carvedilol  3.125 mg Oral BID WC  . chlorhexidine  15 mL Mouth Rinse BID  . Chlorhexidine Gluconate Cloth  6 each Topical Daily  . [START ON 01/15/2021] collagenase   Topical Daily  . docusate  50 mg Per Tube Daily  . DULoxetine  30 mg Oral Daily  . enoxaparin (LOVENOX) injection  120 mg Subcutaneous Q12H  . feeding supplement (OSMOLITE 1.5 CAL)  900 mL Per Tube Q24H  . feeding supplement (PROSource TF)  90 mL Per Tube QID  . free water  250 mL Per Tube Q4H  . insulin aspart  0-20 Units Subcutaneous Q4H  . insulin aspart  8 Units Subcutaneous Q4H  . insulin detemir  35 Units Subcutaneous BID  . mouth rinse  15 mL Mouth Rinse q12n4p  . metroNIDAZOLE  500 mg Oral Q12H  . multivitamin with minerals  1 tablet Oral Daily  . nutrition supplement (JUVEN)  1 packet Per Tube BID BM  . oxyCODONE  10 mg Per Tube Q6H  . polyethylene glycol  17 g Per Tube Daily  . QUEtiapine  50 mg Oral QHS  . sodium chloride flush  10-40 mL Intracatheter Q12H  . sodium hypochlorite   Topical BID  . vitamin B-12  100 mcg Per Tube Daily  . zolpidem  10 mg Oral QHS   Continuous Infusions: . sodium chloride Stopped  (12/31/20 1020)  .  ceFAZolin (ANCEF) IV 2 g (01/13/21 0150)  . DAPTOmycin (CUBICIN)  IV      Active Problems:   Acute respiratory failure (HCC)   AKI (acute kidney injury) ([place])   Bacteremia due to Escherichia coli   Ureteral stone   Pyelonephritis   Cerebral thrombosis with cerebral infarction   Staphylococcus epidermidis bacteremia   Pressure injury of skin   Dysphagia, pharyngoesophageal phase   Physical deconditioning   Consultants:  PCCM  Urology Dr. Claudia Desanctis, Dr. Matilde Sprang  Nephrology, Dr. Jonnie Finner, Dr. Augustin Coupe  Neurology, Dr. Leonie Man  Infectious disease    Procedures:  See above    Time spent: 20 minutes    Erin Hearing ANP  [facility] 7 am - 330 pm/M-F for direct patient care and secure chat Please refer to Amion for contact info 34  days

## 2021-01-13 NOTE — Progress Notes (Addendum)
Physical Therapy Treatment Patient Details Name: [name] MRN: [id number] DOB: 1960/12/24 Today's Date: 01/13/2021    History of Present Illness 60 year old white male who presented to the emergency room from home 12/10/20.  Pt had 4-5 days of N/V/D. Pt became more confused and at times belligerent. Admitted for hypoxemic respiratory failure and septic shock 2/2 E. Coli bacteremia and UTI and was intubated. Extubated 4/16, now on cortrak. dense LUE paresis; CT head "late subacute to chronic infarct within the  posterior right frontal lobe"    PT Comments    Patient limited by dizziness at EOB with BP dropping after one minute (initially increased from side to sit, however BP cuff on ankle and ?falsely elevated as leg moved into dependent position).   Supine 124/81  Sitting 136/93  Sitting after 1 min 126/88  Standing Unable due to dizziness and back pain   Junious Silk, NP present during majority of session and aware of limited mobility due to apparent orthostasis/dizziness. No nystagmus noted as seated EOB. Patient returned to sidelying and positioned for comfort related to back pain. RN made aware and plans to provide pain medication.      Follow Up Recommendations  SNF     Equipment Recommendations  None recommended by PT    Recommendations for Other Services       Precautions / Restrictions Precautions Precautions: Fall;Other (comment) Precaution Comments: cortrak, foley catheter Restrictions Weight Bearing Restrictions: No    Mobility  Bed Mobility Overal bed mobility: Needs Assistance Bed Mobility: Rolling;Sidelying to Sit;Sit to Sidelying Rolling: Min assist Sidelying to sit: Min assist;+2 for safety/equipment (pt on air mattress and has been impulsive previously)     Sit to sidelying: Min assist;+2 for physical assistance General bed mobility comments: rolling to right with assist to bring left arm across body; side to sit with facilitation at pelvis and  ribs with pt then strong activation; return to side with +2 for safety as pt impulsively returning due to dizziness and back pain    Transfers                 General transfer comment: pt reporting too dizzy to try "I know I can't try.Marland KitchenMarland KitchenI need to lie down"  Ambulation/Gait             General Gait Details: unable   Stairs             Wheelchair Mobility    Modified Rankin (Stroke Patients Only)       Balance Overall balance assessment: Needs assistance Sitting-balance support: Bilateral upper extremity supported;Feet supported Sitting balance-Leahy Scale: Fair Sitting balance - Comments: able to sit EOB with min guard assist due to h/o impulsivity (much less today)                                    Cognition Arousal/Alertness: Awake/alert Behavior During Therapy: Flat affect Overall Cognitive Status: Impaired/Different from baseline                                 General Comments: following simple commands with some delay; thanking therapist for the assistance; asking if his arm will ever recover      Exercises      General Comments General comments (skin integrity, edema, etc.): Wife and ?daughter present for session--all questions answered re: limited by BP and  NP made aware.      Pertinent Vitals/Pain Pain Assessment: Faces Faces Pain Scale: Hurts whole lot Pain Location: back Pain Descriptors / Indicators: Restless;Grimacing Pain Intervention(s): Limited activity within patient's tolerance;Monitored during session;Repositioned    Home Living                      Prior Function            PT Goals (current goals can now be found in the care plan section) Acute Rehab PT Goals Patient Stated Goal: agrees to incr mobility PT Goal Formulation: Patient unable to participate in goal setting Time For Goal Achievement: 01/15/21 Potential to Achieve Goals: Fair Progress towards PT goals: Not progressing  toward goals - comment (limited by drop in BP/dizziness)    Frequency    Min 2X/week      PT Plan Discharge plan needs to be updated    Co-evaluation              AM-PAC PT "6 Clicks" Mobility   Outcome Measure  Help needed turning from your back to your side while in a flat bed without using bedrails?: A Little Help needed moving from lying on your back to sitting on the side of a flat bed without using bedrails?: Total Help needed moving to and from a bed to a chair (including a wheelchair)?: Total Help needed standing up from a chair using your arms (e.g., wheelchair or bedside chair)?: Total Help needed to walk in hospital room?: Total Help needed climbing 3-5 steps with a railing? : Total 6 Click Score: 8    End of Session   Activity Tolerance: Treatment limited secondary to medical complications (Comment);Patient limited by pain (drop in BP at EOB) Patient left: with call bell/phone within reach;in bed;with bed alarm set;with nursing/sitter in room;with restraints reapplied Nurse Communication: Mobility status;Need for lift equipment PT Visit Diagnosis: Muscle weakness (generalized) (M62.81);Difficulty in walking, not elsewhere classified (R26.2);Other abnormalities of gait and mobility (R26.89)     Time: 7619-5093 PT Time Calculation (min) (ACUTE ONLY): 15 min  Charges:  $Therapeutic Activity: 8-22 mins                      Jerolyn Center, PT Pager [phone]    Zena Amos 01/13/2021, 11:12 AM

## 2021-01-13 NOTE — Progress Notes (Signed)
Cortrak removed with the aid of Warehouse manager. Pt tolerated well.

## 2021-01-13 NOTE — Progress Notes (Signed)
Brief Calorie Count Note  48 hour calorie count ordered 01/12/21.  RD went to follow-up on Day 1 results of calorie count; however, no meal documentation provided in calorie count envelope or in pt's chart. Per pt, yesterday's intake was not as good as today's has been so far (pt had consumed majority of breakfast tray) due to pt not liking what was provided; pt estimated 25-50% meal completion. Pt does like Magic Cup supplements. Discussed food preferences with pt and will make adjustments on health touch to provide pt with more items he enjoys. Will also transition pt to room service appropriate with assistance per pt/family request to help pt pick out items he enjoys that are also appropriate for his diet. Discussed calorie count with RN again today and stressed importance of completing meal documentation. RN expressed understanding and then completed breakfast meal documentation while RD in room. RD will follow-up tomorrow (5/5) with results of today's intake. Depending on how pt does with intake today, may need to extended calorie count to assess pt's ability to meet his needs po before making decision regarding Cortrak/TF. Discussed with pt who is agreeable to plan. Also confirmed TF was turned off at appropriate time.   Diet: dysphagia 3, nectar thick liquids Supplements: Magic Cup TID  Nutrition Dx: Increased nutrient needs related to wound healing as evidenced by estimated needs. - ongoing  Goal: Patient will meet greater than or equal to 90% of their needs. - progressing  Intervention:  -Change to room service appropriate with assistance -Update pt's preferences on Health Touch (ie no broccoli)  -Continue calorie count -Continue Magic cup TID with meals, each supplement provides 290 kcal and 9 grams of protein -Continue MVI with minerals daily  Continue nocturnal TF via Cortrak: -Provide Osmolite 1.5 @ 47ml/hr for 12 hours from 1800-0600 ( ) -42ml Prosource TF QID -free water per  MD, currently Q4H -juven BID per tube  Nocturnal TF provides 1670 kcals, 144g protein, free water ( total free water with flushes) Meets 77% and 83% minimum estimated calorie and protein needs, respectively    Eugene Gavia, MS, RD, LDN RD pager number and weekend/on-call pager number located in [place].

## 2021-01-13 NOTE — Progress Notes (Signed)
Palliative Care Progress Note  Family meeting today with patient. His wife and his daughter. Patient is awake alert and engaged today. He slept much better last PM with Ambien and is able to follow instructions and work with therapy. He asked for Pepsi today - I assisted him with thin liquids and he had no issues with swallowing or signs of aspiration- he was extremely grateful and his communication was appropriate.  Wife and daughter are overwhelmed with managing and adapting to his unexpected and life threatening critical illness. We discussed the possible trajectories of his illness and they provided important information on his pre-admission state of health. They described the patient as very tough and controlling-he rarely sought medical care and they were largely unaware of the toll his untreated diabetes and other medical issues were taking on his body.   Prior to admission he had issues with poor wound healing, recurrent skin infections and infected knee joint recently. They also described unusual neurological symptoms like unexplained muscle jerking and severe restless leg symptoms. He also had early signs of mild cognitive impairment with memory issues and some paranoia. 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 I encouraged her to seek legal assistance now. He is uninsured presently, but will likely be permanently disabled.  We further discussed his goals of care- while I supported them in hoping for a good recovery with his QOL maintained, I discussed the possibility of further decline or unexpected acute decline and what kind  of response should occur. They are open to completing a MOST form. He would not want CPR or Life Support, No feeding tube, other wise full scope treatment.  Recommendations:  1. DNR, Full scope Treatment outside of cardiac arrest.  2. DC cortrak, allow for PO intake and full assist,  3. SLP re-address textures- he did well with thins today and hios mental status has improved dramatically.  4. Continue with aggressive rehabilitation efforts.  5. Maintain QHS Ambien -he has responded very well to this for sleep and regulation of hospital related delirium.  6. Maintain current opioid regimen. Monitor for constipation issues.  Will follow as needed- I made a plan to check in with his family early next week for update and planning.  Anderson Malta, DO Palliative Medicine  Time: 35 min Greater than 50%  of this time was spent counseling and coordinating care related to the above assessment and plan.

## 2021-01-14 LAB — GLUCOSE, CAPILLARY
Glucose-Capillary: 90 mg/dL (ref 70–99)
Glucose-Capillary: 186 mg/dL — ABNORMAL HIGH (ref 70–99)
Glucose-Capillary: 113 mg/dL — ABNORMAL HIGH (ref 70–99)
Glucose-Capillary: 145 mg/dL — ABNORMAL HIGH (ref 70–99)
Glucose-Capillary: 156 mg/dL — ABNORMAL HIGH (ref 70–99)

## 2021-01-14 MED ORDER — PANTOPRAZOLE SODIUM 40 MG PO TBEC
40.0000 mg | DELAYED_RELEASE_TABLET | Freq: Two times a day (BID) | ORAL | Status: DC
  Administered 2021-01-14 – 2021-02-02 (×39): 40 mg via ORAL
  Filled 2021-01-14 (×39): qty 1

## 2021-01-14 MED ORDER — ENSURE ENLIVE PO LIQD
237.0000 mL | Freq: Three times a day (TID) | ORAL | Status: DC
  Administered 2021-01-14 – 2021-01-28 (×32): 237 mL via ORAL
  Filled 2021-01-14 (×6): qty 237

## 2021-01-14 NOTE — TOC Progression Note (Signed)
Transition of Care [facility]) - Progression Note    Patient Details  Name: [name] MRN: [id number] Date of Birth: [birth year]  Transition of Care [facility]) CM/SW [place], RN Phone Number: 01/14/2021, 7:43 AM  Clinical Narrative:    Case management met with the patient, wife, and daughter, Davon Abdelaziz [phone]) at the bedside to discuss patient's need for SNF placement.  The patient's wife was tearful and states "I'm just so overwhelmed".  The patient's wife understands that the patient will need SNf placement for IV antibiotics and Pt/OT therapies for physically reconditioning and care.  The wife and daughter were given instructions to make an appointment with Tillie Rung in financial counseling office to reapply for Medicaid.  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  The patient and wife do not own their home.  The daughter and patient's wife have made an appointment with financial counseling for 01/14/2021.  The patient will need SNf placement for post-hospitalization care until he receives completion of antibiotics and is able to mobilize safely for return to home.  The Cortrak is still in place but orders to discontinue soon per Erin Hearing.  The patient has no sensation or purposeful movement of left arm and remains dizzy and orthostatic with PT/OT therapy.  CM and MSW will continue to follow the patient for [facility] placement.  Madilyn Fireman, MSW faxed patient out to [facility] and [facility] for possible placement, considering patient is difficult to place.   Expected Discharge Plan: Skilled Nursing Facility Barriers to Discharge: Continued Medical Work up,Inadequate or no insurance,SNF Pending payor source - [facility]  Expected Discharge Plan and Services Expected Discharge Plan: [place] In-house Referral: Clinical Social Work Discharge Planning Services: CM Consult Post Acute Care Choice: [place] arrangements for the past 2 months: Single Family Home                                       Social Determinants of Health (SDOH) Interventions    Readmission Risk Interventions Readmission Risk Prevention Plan 01/11/2021  Transportation Screening Complete  PCP or Specialist Appt within 3-5 Days Complete  HRI or [place] Complete  Social Work Consult for [place] Planning/Counseling Complete  Palliative Care Screening Complete  Medication Review Press photographer) Complete

## 2021-01-14 NOTE — Progress Notes (Addendum)
Nutrition Follow-up  DOCUMENTATION CODES:   Obesity unspecified  INTERVENTION:  -d/c calorie count -Ensure Enlive po TID, each supplement provides 350 kcal and 20 grams of protein -Continue Magic cup TID with meals, each supplement provides 290 kcal and 9 grams of protein -Continue MVI with minerals daily -Continue room service with assistance  NUTRITION DIAGNOSIS:   Increased nutrient needs related to wound healing as evidenced by estimated needs. - ongoing  GOAL:   Patient will meet greater than or equal to 90% of their needs - progressing  MONITOR:   TF tolerance,Labs,Diet advancement,PO intake  REASON FOR ASSESSMENT:   Ventilator,Consult Enteral/tube feeding initiation and management  ASSESSMENT:   60 yo male admitted with acute metabolic encephalopathy, acute respiratory failure, septic shock, E coli bacteremia, E coli UTI. PMH includes recent severe diarrhea and vomiting, chronic pain, HTN, HLD.  3/31 - intubated 4/5 - extubated then immediately reintubated d/t secretions  4/6 - s/p ureteral stent placement 4/16 - extubated 4/18 - cortrak placed (gastric tip) 4/21 - dysphagia 2 diet with honey thick liquids ordered s/p BSE 4/22 - tx to [facility] 4/24 - blood cultures w/staph epidermidis bacteremia/gram-negative rods bacteremia(ID following), PICC removed; off O2; diet downgraded to dysphagia 1 with honey thick liquids s/p BSE 4/25 - PCCM signed off 5/1 - diet advanced to dysphagia 2 with nectar thick liquids 5/3 - diet advanced to dysphagia 3 with nectar thick liquids  5/4 - cortrak removed 5/5 - diet advanced to regular with thin liquids  Pt discussed with DO, SLP, NP, and LPN. Note pt's Cortrak was removed after RD visit yesterday (per MD request) and diet was advanced to regular with thin liquids today s/p BSE. Note plan for pt to discharge to SNF once bed offer is available and pt is stable.   PO intake has been 0-100% x 3 recorded meals (53% average intake),  though reported intake is more like 50-75% per pt, family, and LPN. Pt still doing well with Magic Cup. Will trial Ensure.  UOP: 1.55L x24 hours  Medications: colace, SSI TID w/ meals and at bedtime, 35 units levemir BID, mvi with minerals, miralax, vitamin B12 Labs reviewed.  CBGs 90-139  Diet Order:   Diet Order            Diet regular Room service appropriate? Yes with Assist; Fluid consistency: Thin  Diet effective now                 EDUCATION NEEDS:   Not appropriate for education at this time  Skin:  Skin Assessment: Skin Integrity Issues: Skin Integrity Issues:: Stage III Stage III: R buttocks Unstageable: DTPI to the left foot and sacral area Incisions: penis  Last BM:  5/3  Height:   Ht Readings from Last 1 Encounters:  12/25/20 6\' 2"  (1.88 m)    Weight:   Wt Readings from Last 1 Encounters:  01/09/21 132 kg    Ideal Body Weight:  86.4 kg  BMI:  Body mass index is 37.36 kg/m.  Estimated Nutritional Needs:   Kcal:  2160-2590  Protein:  173-216 gm  Fluid:  >/= 2 L    05-22-1998, MS, RD, LDN RD pager number and weekend/on-call pager number located in Amion.

## 2021-01-14 NOTE — TOC Progression Note (Signed)
Transition of Care [facility]) - Progression Note    Patient Details  Name: [name] MRN: [id number] Date of Birth: Jul 01, 1961  Transition of Care [facility]) CM/SW [place], RN Phone Number: 01/14/2021, 10:43 AM  Clinical Narrative:    Case management met with the patient at the bedside to discuss transitions of care to SNF once bed offer is available - [facility] and [place] pending and reviewing clinicals for possible bed offer.  Patient will need continued IV antibiotics and PICC line if bed offer determined.  The patient's Cortrak is discontinued and patient was alert and oriented for assessment and improved presentation and color as compared to yesterday.  Nursing staff has ordered Kreg bed to assist with postural hypotension as noted by PT/OT.    Patient's wife and daughter have appointment with Tillie Rung in financial counseling today to reapply for Medicaid since denial in the past.  The patient has no mobility and sensation in left arm after CVA.  CM and MSW to continue to follow the patient for [facility] placement.   Expected Discharge Plan: Skilled Nursing Facility Barriers to Discharge: Continued Medical Work up,Inadequate or no insurance,SNF Pending payor source - LOG  Expected Discharge Plan and Services Expected Discharge Plan: [place] In-house Referral: Clinical Social Work Discharge Planning Services: CM Consult Post Acute Care Choice: [place] arrangements for the past 2 months: Single Family Home                                       Social Determinants of Health (SDOH) Interventions    Readmission Risk Interventions Readmission Risk Prevention Plan 01/11/2021  Transportation Screening Complete  PCP or Specialist Appt within 3-5 Days Complete  HRI or [place] Complete  Social Work Consult for [place] Planning/Counseling Complete  Palliative Care Screening Complete  Medication Review Designer, fashion/clothing) Complete

## 2021-01-14 NOTE — Progress Notes (Signed)
  Speech Language Pathology Treatment: Dysphagia;Cognitive-Linquistic  Patient Details Name: [name] MRN: [id number] DOB: 1961/08/05 Today's Date: 01/14/2021 Time: 3267-1245 SLP Time Calculation (min) (ACUTE ONLY): 29.97 min  Assessment / Plan / Recommendation Clinical Impression  Pt was seen for treatment. He was alert and cooperative during the session. Pt reported that he has been tolerating the upgraded diet without overt s/sx of aspiration; however, his reliability is questioned due to impairments in memory. Pt tolerated regular texture solids and ~ 4oz of thin liquids via cup without overt s/sx of aspiration. Pt demonstrated a mildly wet vocal quality with consecutive swallows of thin liquids via straw suggesting possible laryngeal invasion. Oral clearance was adequate and mastication was [facility] despite reduced dentition. Pt was partially oriented to time, but with verbal prompts for reasoning he was able to provide the accurate year, month, and date. He achieved 100% accuracy with problem solving related to safety and with time management problems, but repetition was needed for pt to recall information. Pt completed a three-digit backward digit span task with 100% accuracy. He achieved 33% accuracy with a mental manipulation sequencing task increasing to 67% accuracy with repetition and verbal prompts. Pt's diet will be advanced to regular texture solids and thin liquids via cup. Full supervision is recommended for cueing for reduced intake rate.    HPI HPI: Pt is a 60 year old male who presented 3/31 to the ED with 4-5 day hx of diarrhea and vomiting. He developed confusion and became belligerent at home. Initial ED evaluation found him to be hypotensive, combative, confused, and he was intubated.  He was noted to have mottling over his trunk and extremities and was  admitted for hypoxemic respiratory failure and septic shock 2/2 E. Coli bacteremia and UTI, and ATN. ETT 3/31-4/5; reintubated  4/5-4/16. 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      SLP Plan  Continue with current plan of care       Recommendations  Diet recommendations: Regular;Thin liquid Liquids provided via: Cup;No straw Medication Administration: Whole meds with puree Supervision: Patient able to self feed;Full supervision/cueing for compensatory strategies Compensations: Slow rate;Small sips/bites;Minimize environmental distractions Postural Changes and/or Swallow Maneuvers: Seated upright 90 degrees;Upright 30-60 min after meal                Oral Care Recommendations: Oral care BID;Oral care prior to ice chip/H20 Follow up Recommendations: Skilled Nursing facility SLP Visit Diagnosis: Dysphagia, oropharyngeal phase (R13.12) Plan: Continue with current plan of care       Stephen Ruta I. Vear Clock, MS, CCC-SLP Acute Rehabilitation Services Office number [phone] Pager [phone]                 [name] 01/14/2021, 10:02 AM

## 2021-01-14 NOTE — Progress Notes (Signed)
ANTICOAGULATION CONSULT NOTE - Follow Up Consult  Pharmacy Consult for Heparin>>Lovenox Indication: Left IJ and subclavian non-occlusive clots  Labs: Recent Labs    01/13/21 0432  CKTOTAL 36*   Assessment: 60 year old male admitted for hypoxemic respiratory failure and septic shock 2/2 E. Coli bacteremia and UTI, and ATN. Now with Left IJ and subclavian non-occlusive clots.   Continues on full dose Lovenox since 4/21  Goal of Therapy:  Anti-xa - 0.6-1 Monitor platelets by anticoagulation protocol: Yes   Plan:  Continue Lovenox 120mg  SQ q12 Switch to oral agent now?  Thank you , PharmD 01/14/2021 8:14 AM  Please check [email or web address] for unit-specific pharmacy phone numbers.

## 2021-01-14 NOTE — Progress Notes (Signed)
CSW spoke with Lowella Bandy at [facility] - administration is not available to review this patient for possible DTP bed, decision will be made tomorrow upon their return.  Edwin Dada, MSW, LCSW Transitions of Care  Clinical Social Worker II [phone]

## 2021-01-14 NOTE — Plan of Care (Signed)
  Problem: Education: Goal: Knowledge of General Education information will improve Description: Including pain rating scale, medication(s)/side effects and non-pharmacologic comfort measures Outcome: Progressing   Problem: Health Behavior/Discharge Planning: Goal: Ability to manage health-related needs will improve Outcome: Progressing   Problem: Clinical Measurements: Goal: Ability to maintain clinical measurements within normal limits will improve Outcome: Progressing Goal: Will remain free from infection Outcome: Progressing Goal: Diagnostic test results will improve Outcome: Progressing Goal: Respiratory complications will improve Outcome: Progressing Goal: Cardiovascular complication will be avoided Outcome: Progressing   Problem: Activity: Goal: Risk for activity intolerance will decrease Outcome: Progressing   Problem: Nutrition: Goal: Adequate nutrition will be maintained Outcome: Progressing   Problem: Coping: Goal: Level of anxiety will decrease Outcome: Progressing   Problem: Elimination: Goal: Will not experience complications related to bowel motility Outcome: Progressing Goal: Will not experience complications related to urinary retention Outcome: Progressing   Problem: Pain Managment: Goal: General experience of comfort will improve Outcome: Progressing   Problem: Safety: Goal: Ability to remain free from injury will improve Outcome: Progressing   Problem: Skin Integrity: Goal: Risk for impaired skin integrity will decrease Outcome: Progressing   Problem: Safety: Goal: Non-violent Restraint(s) Outcome: Progressing   Problem: Education: Goal: Knowledge of disease or condition will improve Outcome: Progressing Goal: Knowledge of secondary prevention will improve Outcome: Progressing Goal: Knowledge of patient specific risk factors addressed and post discharge goals established will improve Outcome: Progressing   Problem: Self-Care: Goal:  Verbalization of feelings and concerns over difficulty with self-care will improve Outcome: Progressing   Problem: Nutrition: Goal: Dietary intake will improve Outcome: Progressing

## 2021-01-14 NOTE — Progress Notes (Addendum)
[facility] PROGRESS NOTE  [name] TDD:[id number] DOB: 1960/12/18 DOA: 12/10/2020 PCP: Pcp, No  Status: Remains inpatient appropriate because:Altered mental status, Unsafe d/c plan, IV treatments appropriate due to intensity of illness or inability to take PO and Inpatient level of care appropriate due to severity of illness   Dispo: The patient is from: Home              Anticipated d/c is to: SNF-as of 5/5 no bed offers              Patient currently is not medically stable to d/c.   Difficult to place patient Yes                Level of care: Med-Surg  Code Status: Full Family Communication: Patient, wife and daughter on 5/4. DVT prophylaxis: Lovenox Vaccination status: Unvaccinated-CM spoke with wife who states that for political reasons patient has refused COVID vaccination  Foley catheter: Foley catheter discontinued on 5/4 with plans to utilize condom catheter  HPI: 60 year old male with past medical history significant for essential hypertension, hyperlipidemia who [facility] ED on 3/31 for a 5-day history of nausea/vomiting/diarrhea and confusion.  Patient was initially admitted to [facility] service for acute hypoxic respiratory failure requiring intubation and ventilatory support with septic shock secondary to E. coli septicemia, UTI, MSSA Pneumonia, MRSE bacteremia (CLABSI involving left IJ vein and subclavian vein), with possible endovascular infection.  Infectious disease followed during hospital course.  Patient transferred from [facility] to [facility] on 01/01/2021 for further management of ongoing metabolic encephalopathy, tachycardia, dysphagia requiring tube feeds, intermittent fevers and prolonged antibiotic course.  Procedures/significant Hospital events:   Intubation 3/31  Triple-lumen central line placement 3/31  4/1 off pressors. E coli bacteremia, e coli uti. Some improvement in pulm opacities on CXR. On rocephin. TTE with LVEF 40-45%, LV global  hypokinesis, G2DD, normal RV, dilated aortic root at 15m.  Bronchoscopy with BAL 4/5, Dr. STamala Julian 4/5 extubated, failed immediately due to mucous plugging identified on bronch after re-intubated. CT ABD with 456mleft ureteral stone with mild edema of left kideny, large areas of consolidation in bilateral lung bases.   4/6 urology consulted for distal left ureteral stone, to OR overnight for stent placement, UCx sent. Versed added for agitation (on fent / precedex)  4/7 Off vasopressors  4/11 Vasopressors restarted   4/13: Left IJ holiday  4/15: Right IJ placed   4/16: extubated, remained agitated and restless requiring IV Precedex infusion  4/18: Cortrak tube insertion and removed 5/4  Antimicrobials:   Daptomycin 4/26>> (End 6/5)  Cefazolin 4/4 - 4/6, 4/28>> (End 5/4)  Metronidazole 4/27>> (End 5/4)  Cefepime 4/6 - 4/7  Ceftriaxone 3/31 - 3/31, 4/7 - 4/11, 4/14 - 4/15  Vancomycin 3/31 - 3/31, 4/14 - 4/24, 4/26 - 4/28  Zosyn 3/31 - 3/31    Subjective: Awakened.  Pleasant and interactive.  No complaints verbalized.  Objective: Vitals:   01/13/21 2031 01/14/21 0538  BP: 140/82 140/79  Pulse: 98 88  Resp: 20 20  Temp: 98.3 F (36.8 C) 98.2 F (36.8 C)  SpO2: 95% 94%    Intake/Output Summary (Last 24 hours) at 01/14/2021 0807 Last data filed at 01/14/2021 0500 Gross per 24 hour  Intake 766 ml  Output 1550 ml  Net -784 ml   Filed Weights   12/29/20 0400 01/03/21 0500 01/09/21 0500  Weight: 126.7 kg 120.6 kg 132 kg    Exam:  Constitutional: Alert, calm, cooperative and  in no acute distress Respiratory: Lungs are clear, respiratory effort nonlabored, room air Cardiovascular: S1-S2, no peripheral edema, regular pulse Abdomen: Soft nontender nondistended with normoactive bowel sounds.  On dysphagia 3 diet.  LBM 5/3, Skin: Has stage III decubitus on hip Neurologic: Dense left upper extremity hemiparesis that is also insensate.  Moves all other extremities  equally with strength 4/5. Psychiatric: Alert and oriented x3.  Pleasant interactive affect   Assessment/Plan: Acute problems: Acute hypoxic respiratory failure: Resolved MSSA pneumonia --Presented with hypoxia that required intubation associated with sepsis physiology  MRSE endovascular infection/septicemia E. coli UTI/septicemia Septic shock, POA --Infectious disease following, appreciate assistance --Continue daptomycin 900 mg IV daily x 6 weeks from 4/24 (~02/14/21) *5/4 discussed with ID Dr. Montel Culver who recommended continuing IV antibiotics for 3 to 4 weeks before discussing potential transition to other oral treatment 5/2 follow-up blood cultures negative  Atypical chest pain -- Notified by staff nurse that patient having chest pain today; patient clarified to bedside nurse that he had been having this pain ongoing for several days -- EKG with sinus rhythm and no ST or T wave changes that would be concerning for acute ischemia or infarct -- Of note patient lays supine in bed consistently due to dizziness when upright.  Has transition from tube feedings to more solid diet.  Suspect pain is more GI and reflux in nature. -- Begin Protonix twice daily -- Encourage upright after meals for at least 30 minutes  Acute toxic metabolic encephalopathy/ongoing delirium/subacute vs chronic infarct with dense insensate LUE hemiparesis -Acute encephalopathy resolved --continue asa and statin --Continues to complain of significant dizziness while working with PT and transitioning from upright position.  Intermittently orthostatic.  No indication to initiate midodrine at this point.  5/4 have ordered Kreg tilt bed  Stage III right buttock decubitus -Initially started as a deep tissue injury and has progressed to decubitus --Continue antibiotics as above, Flagyl 500 mg per tube q12h. as of 5/5 has received 9 days of Flagyl.  After 10 days we will likely discontinue -- Evaluated by general  surgery, no need for debridement --Wound care RN following with recommendations for Santyl -5/3 attending physician ordered 3 days of twice daily Dakin's solution; continue hydrotherapy with PT  Type 2 diabetes mellitus --Home med: Glipizide --Hemoglobin A1c 6.8. --Lantus 35 u BID -off TFs so dc Novolog 8 u [place] q4h  Left IJ/subclavian DVT versus septic thrombus -Left IJ central line subsequently discontinued and RUE PICC line removed 4/24   --Repeat ultrasound duplex scan 4/25 with resolution of DVT. --Continue Lovenox therapeutic dose  Essential hypertension --Continue carvedilol   Recurrent pyelonephritis with obstructive hydronephrosis --s/p left ureteral stent 4/6.   --Repeat ultrasound with mild left hydronephrosis.  CT abdomen with stable position of double-J stent. -- Continue Foley catheter  Dysphagia/mild protein calorie malnutrition/morbid obesity -- Continue D3 diet with nectar thick liquid-Magic Cup --5/2 transitioned to nocturnal tube feedings and calorie count initiated-has done well with recommendation to discontinue tube therefore tube removed on 5/4 --Estimated body mass index is 37.36 kg/m as calculated from the following:   Height as of this encounter: _0  (1.88 m).   Weight as of this encounter: 132 kg.  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  Prognosis has been relayed to his spouse on multiple occasions given his overall poor clinical condition.  Remains full code    Other problems: NASH cirrhosis:  Outpatient follow-up with gastroenterology  Mild hyponatremia --Resolved  Hypokalemia --Resolved  Acute  renal failure -- Resolved  HLD:  -- Continue atorvastatin 40 mg daily      Data Reviewed: Basic Metabolic Panel: Recent Labs  Lab 01/08/21 0039 01/09/21 0152 01/10/21 0250 01/11/21 0207 01/13/21 0432  NA 144 146* 143 140  --   K 3.3* 3.8 4.0 3.6  --   CL 111 111 107 106  --   CO2 _0 --   GLUCOSE 170* 134* 153* 88  --   BUN 53* 44* 38* 34*  --   CREATININE 0.88 0.89 0.82 0.77  --   CALCIUM 8.6* 8.6* 8.6* 8.7*  --   MG  --   --   --   --  1.9  PHOS  --   --   --   --  3.1   CBC: Recent Labs  Lab 01/08/21 0039 01/09/21 0152 01/10/21 0250 01/11/21 0207  WBC 9.4 8.6 9.1 8.3  HGB 8.5* 9.0* 9.0* 8.6*  HCT 26.9* 28.1* 28.7* 27.4*  MCV 99.3 98.3 99.3 98.6  PLT 193 220 218 246   Cardiac Enzymes: Recent Labs  Lab 01/13/21 0432  CKTOTAL 36*    CBG: Recent Labs  Lab 01/13/21 0722 01/13/21 1158 01/13/21 1635 01/13/21 1944 01/14/21 0716  GLUCAP 101* 92 95 139* 90     Studies: No results found.  Scheduled Meds: . aspirin  81 mg Oral Daily  . atorvastatin  40 mg Oral Daily  . carvedilol  3.125 mg Oral BID WC  . chlorhexidine  15 mL Mouth Rinse BID  . Chlorhexidine Gluconate Cloth  6 each Topical Daily  . [START ON 01/15/2021] collagenase   Topical Daily  . docusate  50 mg Oral Daily  . DULoxetine  30 mg Oral Daily  . enoxaparin (LOVENOX) injection  120 mg Subcutaneous Q12H  . insulin aspart  0-5 Units Subcutaneous QHS  . insulin aspart  0-9 Units Subcutaneous TID WC  . insulin detemir  35 Units Subcutaneous BID  . mouth rinse  15 mL Mouth Rinse q12n4p  . metroNIDAZOLE  500 mg Oral Q12H  . multivitamin with minerals  1 tablet Oral Daily  . oxyCODONE  10 mg Oral Q6H  . polyethylene glycol  17 g Oral Daily  . QUEtiapine  50 mg Oral QHS  . sodium chloride flush  10-40 mL Intracatheter Q12H  . sodium hypochlorite   Topical BID  . vitamin B-12  100 mcg Oral Daily  . zolpidem  10 mg Oral QHS   Continuous Infusions: . sodium chloride Stopped  (12/31/20 1020)  .  ceFAZolin (ANCEF) IV 2 g (01/14/21 0134)  . DAPTOmycin (CUBICIN)  IV 900 mg (01/13/21 2109)    Active Problems:   Acute respiratory failure (HCC)   AKI (acute kidney injury) ([place])   Bacteremia due to Escherichia coli   Ureteral stone   Pyelonephritis   Cerebral thrombosis with cerebral infarction   Staphylococcus epidermidis bacteremia   Pressure injury of skin   Dysphagia, pharyngoesophageal phase   Physical deconditioning   Consultants:  PCCM  Urology Dr. Claudia Desanctis, Dr. Matilde Sprang  Nephrology, Dr. Jonnie Finner, Dr. Augustin Coupe  Neurology, Dr. Leonie Man  Infectious disease    Procedures:  See above    Time spent: 20 minutes  Erin Hearing ANP  [facility] 7 am - 330 pm/M-F for direct patient care and secure chat Please refer to Amion for contact info 35  days   Attestation of Attending Supervision of Advanced Practitioner (PA/CNM/NP): Evaluation and management procedures were performed by the Advanced Practitioner under my supervision and collaboration.  I have reviewed the Advanced Practitioner's note and chart, and I agree with the management and plan. The patient was seen and examined by myself yesterday.  Donnamae Jude, MD [facility] 01/15/2021 12:18 PM

## 2021-01-15 LAB — GLUCOSE, CAPILLARY
Glucose-Capillary: 73 mg/dL (ref 70–99)
Glucose-Capillary: 81 mg/dL (ref 70–99)
Glucose-Capillary: 90 mg/dL (ref 70–99)
Glucose-Capillary: 90 mg/dL (ref 70–99)
Glucose-Capillary: 117 mg/dL — ABNORMAL HIGH (ref 70–99)
Glucose-Capillary: 160 mg/dL — ABNORMAL HIGH (ref 70–99)

## 2021-01-15 MED ORDER — DICLOFENAC SODIUM 1 % EX GEL
2.0000 g | Freq: Four times a day (QID) | CUTANEOUS | Status: DC
  Administered 2021-01-15 – 2021-02-01 (×65): 2 g via TOPICAL
  Filled 2021-01-15: qty 100

## 2021-01-15 NOTE — Progress Notes (Signed)
[facility] PROGRESS NOTE  [name] UXL:[id number] DOB: 1961-08-20 DOA: 12/10/2020 PCP: Pcp, No  Status: Remains inpatient appropriate because:Altered mental status, Unsafe d/c plan, IV treatments appropriate due to intensity of illness or inability to take PO and Inpatient level of care appropriate due to severity of illness   Dispo: The patient is from: Home              Anticipated d/c is to: SNF-as of 5/5 no bed offers              Patient currently is not medically stable to d/c.   Difficult to place patient Yes                Level of care: Med-Surg  Code Status: Full Family Communication: Patient, wife and daughter on 5/4. DVT prophylaxis: Lovenox Vaccination status: Unvaccinated-CM spoke with wife who states that for political reasons patient has refused COVID vaccination  Foley catheter: Foley catheter discontinued on 5/4 with plans to utilize condom catheter  HPI: 60 year old male with past medical history significant for essential hypertension, hyperlipidemia who presented to [facility] ED on 3/31 for a 5-day history of nausea/vomiting/diarrhea and confusion.  Patient was initially admitted to [facility] service for acute hypoxic respiratory failure requiring intubation and ventilatory support with septic shock secondary to E. coli septicemia, UTI, MSSA Pneumonia, MRSE bacteremia (CLABSI involving left IJ vein and subclavian vein), with possible endovascular infection.  Infectious disease followed during hospital course.  Patient transferred from [facility] to [facility] on 01/01/2021 for further management of ongoing metabolic encephalopathy, tachycardia, dysphagia requiring tube feeds, intermittent fevers and prolonged antibiotic course.  Procedures/significant Hospital events:   Intubation 3/31  Triple-lumen central line placement 3/31  4/1 off pressors. E coli bacteremia, e coli uti. Some improvement in pulm opacities on CXR. On rocephin. TTE with LVEF 40-45%, LV global  hypokinesis, G2DD, normal RV, dilated aortic root at 65m.  Bronchoscopy with BAL 4/5, Dr. STamala Julian 4/5 extubated, failed immediately due to mucous plugging identified on bronch after re-intubated. CT ABD with 466mleft ureteral stone with mild edema of left kideny, large areas of consolidation in bilateral lung bases.   4/6 urology consulted for distal left ureteral stone, to OR overnight for stent placement, UCx sent. Versed added for agitation (on fent / precedex)  4/7 Off vasopressors  4/11 Vasopressors restarted   4/13: Left IJ holiday  4/15: Right IJ placed   4/16: extubated, remained agitated and restless requiring IV Precedex infusion  4/18: Cortrak tube insertion and removed 5/4  Antimicrobials:   Daptomycin 4/26>> (End 6/5)  Cefazolin 4/4 - 4/6, 4/28>> (End 5/4)  Metronidazole 4/27>> (End 5/4)  Cefepime 4/6 - 4/7  Ceftriaxone 3/31 - 3/31, 4/7 - 4/11, 4/14 - 4/15  Vancomycin 3/31 - 3/31, 4/14 - 4/24, 4/26 - 4/28  Zosyn 3/31 - 3/31    Subjective: Currently denies chest pain or indigestion.  Pleasant affect.  Had questions as to whether he could go out on the pass with his son.  Informed patient that unfortunately we do not allow patients to leave the hospital on pass  Objective: Vitals:   01/15/21 0530 01/15/21 0715  BP: 121/73 115/74  Pulse: (!) 108 (!) 106  Resp: 18 19  Temp: 98 F (36.7 C) 99.8 F (37.7 C)  SpO2: 96% 95%    Intake/Output Summary (Last 24 hours) at 01/15/2021 0737 Last data filed at 01/15/2021 0649 Gross per 24 hour  Intake 480 ml  Output 1550 ml  Net -1070 ml   Filed Weights   12/29/20 0400 01/03/21 0500 01/09/21 0500  Weight: 126.7 kg 120.6 kg 132 kg    Exam:  Constitutional: Awake, calm, comfortable Respiratory: Lungs are clear, stable on room air Cardiovascular: S1-S2, no JVD or peripheral edema, regular pulse Abdomen:  LBM 5/3, soft nontender nondistended with normoactive bowel sound Skin: Has stage III decubitus on  hip Neurologic: Dense left upper extremity hemiparesis that is also insensate.  Moves all other extremities equally with strength 4/5. Psychiatric: Alert and oriented x3 with pleasant affect  Assessment/Plan: Acute problems: Acute hypoxic respiratory failure: Resolved MSSA pneumonia --Presented with hypoxia that required intubation associated with sepsis physiology  MRSE endovascular infection/septicemia E. coli UTI/septicemia Septic shock, POA --Infectious disease following, appreciate assistance --Continue daptomycin 900 mg IV daily x 6 weeks from 4/24 (~02/14/21) *5/4 discussed with ID Dr. Montel Culver who recommended continuing IV antibiotics for 3 to 4 weeks before discussing potential transition to other oral treatment 5/2 follow-up blood cultures negative  Atypical chest pain --Several days in duration with normal EKG --Suspect GI etiology therefore Protonix initiated and orders placed encourage upright after meals for at least 30 minutes  Acute toxic metabolic encephalopathy/ongoing delirium/subacute vs chronic infarct with dense insensate LUE hemiparesis -Acute encephalopathy resolved --continue asa and statin -- Continue PT.  Bed ordered to help with positional dizziness intermittently associated with orthostasis  Stage III right buttock decubitus -Initially started as a deep tissue injury and has progressed to decubitus --Flagyl x10 days with last dose 5/6 --Wound care RN following with recommendations for Santyl  Type 2 diabetes mellitus --Home med: Glipizide --Hemoglobin A1c 6.8. --Lantus 35 u BID -off TFs so dc Novolog 8 u [place] q4h  Left IJ/subclavian DVT versus septic thrombus -Left IJ central line subsequently discontinued and RUE PICC line removed 4/24   --Repeat ultrasound duplex scan 4/25 with resolution of DVT. --Continue Lovenox therapeutic dose  Essential hypertension --Continue carvedilol   Recurrent pyelonephritis with obstructive hydronephrosis --s/p  left ureteral stent 4/6.   --Repeat ultrasound with mild left hydronephrosis.  CT abdomen with stable position of double-J stent. -- Continue Foley catheter  Dysphagia/mild protein calorie malnutrition/morbid obesity -- Continue D3 diet with nectar thick liquid-Magic Cup --5/2 transitioned to nocturnal tube feedings and calorie count initiated-has done well with recommendation to discontinue tube therefore tube removed on 5/4 --Estimated body mass index is 37.36 kg/m as calculated from the following:   Height as of this encounter: _0  (1.88 m).   Weight as of this encounter: 132 kg.  Weakness/deconditioning/debility: --Continue PT/OT  -recommendation is for rehabilitation at short-term SNF -Unclear if can return to work given insensate LUE hemiparesis  Goals of care: --Palliative care has evaluated patient.  Prognosis has been relayed to his spouse on multiple occasions given his overall poor clinical condition.  Remains full code    Other problems: NASH cirrhosis:  Outpatient follow-up with gastroenterology  Mild hyponatremia --Resolved  Hypokalemia --Resolved  Acute renal failure -- Resolved  HLD:  -- Continue atorvastatin 40 mg daily      Data Reviewed: Basic Metabolic Panel: Recent Labs  Lab 01/09/21 0152 01/10/21 0250 01/11/21 0207 01/13/21 0432  NA 146* 143 140  --   K 3.8 4.0 3.6  --   CL 111 107 106  --   CO2 _1 --   GLUCOSE 134* 153* 88  --   BUN 44* 38* 34*  --   CREATININE 0.89 0.82 0.77  --  CALCIUM 8.6* 8.6* 8.7*  --   MG  --   --   --  1.9  PHOS  --   --   --  3.1   CBC: Recent Labs  Lab 01/09/21 0152 01/10/21 0250 01/11/21 0207  WBC 8.6 9.1 8.3  HGB 9.0* 9.0* 8.6*  HCT 28.1* 28.7* 27.4*  MCV 98.3 99.3 98.6  PLT 220 218 246   Cardiac Enzymes: Recent Labs  Lab 01/13/21 0432  CKTOTAL 36*    CBG: Recent Labs  Lab 01/14/21 1541 01/14/21 2039 01/14/21 2308 01/15/21 0502 01/15/21 0519  GLUCAP 113* 145* 156*  73 81     Studies: No results found.  Scheduled Meds: . aspirin  81 mg Oral Daily  . atorvastatin  40 mg Oral Daily  . carvedilol  3.125 mg Oral BID WC  . chlorhexidine  15 mL Mouth Rinse BID  . Chlorhexidine Gluconate Cloth  6 each Topical Daily  . collagenase   Topical Daily  . docusate  50 mg Oral Daily  . DULoxetine  30 mg Oral Daily  . enoxaparin (LOVENOX) injection  120 mg Subcutaneous Q12H  . feeding supplement  237 mL Oral TID BM  . insulin aspart  0-5 Units Subcutaneous QHS  . insulin aspart  0-9 Units Subcutaneous TID WC  . insulin detemir  35 Units Subcutaneous BID  . mouth rinse  15 mL Mouth Rinse q12n4p  . metroNIDAZOLE  500 mg Oral Q12H  . multivitamin with minerals  1 tablet Oral Daily  . oxyCODONE  10 mg Oral Q6H  . pantoprazole  40 mg Oral BID AC  . polyethylene glycol  17 g Oral Daily  . QUEtiapine  50 mg Oral QHS  . sodium chloride flush  10-40 mL Intracatheter Q12H  . vitamin B-12  100 mcg Oral Daily  . zolpidem  10 mg Oral QHS   Continuous Infusions: . sodium chloride Stopped (12/31/20 1020)  . DAPTOmycin (CUBICIN)  IV 900 mg (01/14/21 2035)    Active Problems:   Acute respiratory failure (HCC)   AKI (acute kidney injury) ([place])   Bacteremia due to Escherichia coli   Ureteral stone   Pyelonephritis   Cerebral thrombosis with cerebral infarction   Staphylococcus epidermidis bacteremia   Pressure injury of skin   Dysphagia, pharyngoesophageal phase   Physical deconditioning   Consultants:  PCCM  Urology Dr. Claudia Desanctis, Dr. Matilde Sprang  Nephrology, Dr. Jonnie Finner, Dr. Augustin Coupe  Neurology, Dr. Leonie Man  Infectious disease    Procedures:  See above    Time spent: 20 minutes    Erin Hearing ANP  [facility] 7 am - 330 pm/M-F for direct patient care and secure chat Please refer to Amion for contact info 36  days

## 2021-01-15 NOTE — Progress Notes (Signed)
SLP Cancellation Note  Patient Details Name: [name] MRN: [id number] DOB: 27-Apr-1961   Cancelled treatment:       Reason Eval/Treat Not Completed: Patient at procedure or test/unavailable (Pt currently working with PT. SLP will follow up later as schedule allows.)  Tersa Fotopoulos I. Vear Clock, MS, CCC-SLP Acute Rehabilitation Services Office number [phone] Pager [phone]  Scheryl Marten 01/15/2021, 11:23 AM

## 2021-01-15 NOTE — Progress Notes (Signed)
Physical Therapy Wound Treatment Patient Details  Name: [name] MRN: [id number] Date of Birth: Jul 31, 1961  Today's Date: 01/15/2021 Time: 1105-1140 Time Calculation (min): 35 min  Subjective  Subjective Assessment Subjective: Pleasant and agreeable. Wants to see his wound and was shown a picture from his chart. Patient and Family Stated Goals: None stated throughout session. Date of Onset:  (Unknown) Prior Treatments:  (Dressing changes)  Pain Score:  Pt did not complain of any pain throughout session.  Wound Assessment  Pressure Injury 12/21/20 Buttocks Right Deep Tissue Pressure Injury - Purple or maroon localized area of discolored intact skin or blood-filled blister due to damage of underlying soft tissue from pressure and/or shear. purple with blister area that has  (Active)  Wound Image   01/15/21 1441  Dressing Type ABD;Barrier Film (skin prep);Gauze (Comment);Moist to moist;Santyl 01/15/21 1441  Dressing Changed;Clean;Dry;Intact 01/15/21 1441  Dressing Change Frequency Daily 01/15/21 1441  State of Healing Early/partial granulation 01/15/21 1441  Site / Wound Assessment Pink;Yellow 01/15/21 1441  % Wound base Red or Granulating 65% 01/15/21 1441  % Wound base Yellow/Fibrinous Exudate 35% 01/15/21 1441  % Wound base Black/Eschar 0% 01/15/21 1441  % Wound base Other/Granulation Tissue (Comment) 0% 01/15/21 1441  Peri-wound Assessment Intact 01/15/21 1441  Wound Length (cm) 10 cm 01/15/21 1100  Wound Width (cm) 8 cm 01/15/21 1100  Wound Depth (cm) 6 cm 01/15/21 1100  Wound Surface Area (cm^2) 80 cm^2 01/15/21 1100  Wound Volume (cm^3) 480 cm^3 01/15/21 1100  Tunneling (cm) 0 01/15/21 1441  Undermining (cm) 0 01/15/21 1441  Margins Unattached edges (unapproximated) 01/15/21 1441  Drainage Amount Minimal 01/15/21 1441  Drainage Description Serosanguineous 01/15/21 1441  Treatment Debridement (Selective);Hydrotherapy (Pulse lavage);Packing (Saline gauze) 01/15/21 1441       Hydrotherapy Pulsed lavage therapy - wound location: R buttock Pulsed Lavage with Suction (psi): 12 psi Pulsed Lavage with Suction - Normal Saline Used: 1000 mL Pulsed Lavage Tip: Tip with splash shield Selective Debridement Selective Debridement - Location: R buttock Selective Debridement - Tools Used: Forceps,Scalpel,Scissors Selective Debridement - Tissue Removed: unviable tissue    Wound Assessment and Plan  Wound Therapy - Assess/Plan/Recommendations Wound Therapy - Clinical Statement: Resumed Santyl this session. Noted significant improvement since last session 2 days ago. Anticipate continued progress and transition to dressing changes by nursing staff likely in the next week. This pt wil benefit from continued hydrotherapy for selective debridement of unviable tissue, to decrease bioburden and promote wound bed healing. Wound Therapy - Functional Problem List: Global weakness in the setting of critical illness and extended ICU stay. Factors Delaying/Impairing Wound Healing: Immobility,Multiple medical problems,Polypharmacy,Infection - systemic/local Hydrotherapy Plan: Dressing change,Patient/family education,Pulsatile lavage with suction,Debridement Wound Therapy - Frequency: 3X / week (hydrotherapy MWF with nursing to continue with daily dressing changes in between.) Wound Therapy - Follow Up Recommendations: dressing changes by RN  Wound Therapy Goals- Improve the function of patient's integumentary system by progressing the wound(s) through the phases of wound healing (inflammation - proliferation - remodeling) by: Wound Therapy Goals - Improve the function of patient's integumentary system by progressing the wound(s) through the phases of wound healing by: Decrease Necrotic Tissue to: 25% Decrease Necrotic Tissue - Progress: Progressing toward goal Increase Granulation Tissue to: 75% Increase Granulation Tissue - Progress: Progressing toward goal Improve Drainage  Characteristics: Min,Serous Improve Drainage Characteristics - Progress: Progressing toward goal Goals/treatment plan/discharge plan were made with and agreed upon by patient/family: No, Patient unable to participate in goals/treatment/discharge plan and family unavailable Time  For Goal Achievement: 7 days Wound Therapy - Potential for Goals: Good  Goals will be updated until maximal potential achieved or discharge criteria met.  Discharge criteria: when goals achieved, discharge from hospital, MD decision/surgical intervention, no progress towards goals, refusal/missing three consecutive treatments without notification or medical reason.  GP     Charges PT Wound Care Charges $Wound Debridement up to 20 cm: < or equal to 20 cm $ Wound Debridement each add'l 20 sqcm: 1 $PT PLS Gun and Tip: 1 Supply $PT Hydrotherapy Visit: 1 Visit       Thelma Comp 01/15/2021, 2:49 PM   Rolinda Roan, PT, DPT Acute Rehabilitation Services Pager: [phone] Office: [phone]

## 2021-01-15 NOTE — Progress Notes (Signed)
OJ given for hypoglycemia

## 2021-01-15 NOTE — Progress Notes (Signed)
  Speech Language Pathology Treatment: Dysphagia  Patient Details Name: [name] MRN: [id number] DOB: 09-04-1961 Today's Date: 01/15/2021 Time: 6270-3500 SLP Time Calculation (min) (ACUTE ONLY): 14.97 min  Assessment / Plan / Recommendation Clinical Impression  Pt was seen during dinner for dysphagia treatment. Pt and nursing reported that the pt has been tolerating the advanced diet without overt s/sx of aspiration. Pt was reclined upon SLP's entry. Pt was reminded of the impact of this on safety; he verbalized understanding and allowed repositioning. Pt consumed a meal of green beans, baked chicken, corn, mac and cheese, a brownie, and thin liquids via cup. No s/sx of aspiration were noted. Pt's partial dentures were not in place, but pt stated that he wears them "just for appearance" and only uses them for meals when eating corn on the cob. Mastication was [facility] and oral clearance adequate. It is recommended that the pt's current diet be continued. SLP will continue to follow pt.   HPI HPI: Pt is a 60 year old male who presented 3/31 to the ED with 4-5 day hx of diarrhea and vomiting. He developed confusion and became belligerent at home. Initial ED evaluation found him to be hypotensive, combative, confused, and he was intubated.  He was noted to have mottling over his trunk and extremities and was  admitted for hypoxemic respiratory failure and septic shock 2/2 E. Coli bacteremia and UTI, and ATN. ETT 3/31-4/5; reintubated 4/5-4/16. 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      SLP Plan  Continue with current plan of care       Recommendations  Diet recommendations: Regular;Thin liquid Liquids provided via: Cup;No straw Medication Administration: Whole meds with puree Supervision: Patient able to self feed;Full supervision/cueing for compensatory strategies Compensations: Slow rate;Small  sips/bites;Minimize environmental distractions Postural Changes and/or Swallow Maneuvers: Seated upright 90 degrees;Upright 30-60 min after meal                Oral Care Recommendations: Oral care BID;Oral care prior to ice chip/H20 Follow up Recommendations: Skilled Nursing facility SLP Visit Diagnosis: Dysphagia, oropharyngeal phase (R13.12) Plan: Continue with current plan of care       Aleda Madl I. Hardin Negus, [place], [place] Office number [phone] Pager [phone]                Horton Marshall 01/15/2021, 5:50 PM       .

## 2021-01-16 LAB — GLUCOSE, CAPILLARY
Glucose-Capillary: 103 mg/dL — ABNORMAL HIGH (ref 70–99)
Glucose-Capillary: 117 mg/dL — ABNORMAL HIGH (ref 70–99)
Glucose-Capillary: 97 mg/dL (ref 70–99)
Glucose-Capillary: 167 mg/dL — ABNORMAL HIGH (ref 70–99)

## 2021-01-16 NOTE — Progress Notes (Signed)
PROGRESS NOTE  [name] ION:[id number] DOB: [birth year] DOA: 12/10/2020 PCP: Pcp, No   LOS: 39 days   Brief Narrative / Interim history: 60 year old male with past medical history significant for essential hypertension, hyperlipidemia who presented to [facility] ED on 3/31 for a 5-day history of nausea/vomiting/diarrhea and confusion. Patient was initially admitted to [facility] service for acute hypoxic respiratory failure requiring intubation and ventilatory support with septic shock secondary to E. coli septicemia, UTI, MSSA Pneumonia, MRSE bacteremia (CLABSI involving left IJ vein and subclavian vein), with possible endovascular infection. Infectious disease followed during hospital course. Patient transferred from [facility] to [facility] on 01/01/2021 for further management of ongoing metabolic encephalopathy, tachycardia, dysphagia requiring tube feeds, intermittent fevers and prolonged antibiotic course.  Subjective / 24h Interval events: No complaints   Assessment & Plan: Acute problems: Acute hypoxic respiratory failure, MSSA pneumonia -Presented with hypoxia that required intubation associated with sepsis physiology.  Resolved, now on room air  MRSE endovascular infection/septicemia E. coli UTI/septicemia Septic shock, POA -Infectious disease following, appreciate assistance -Continue daptomycin 900 mg IV daily x 6 weeks from 4/24(~02/14/21) *5/4 discussed with ID Dr. Montel Culver who recommended continuing IV antibiotics for 3 to 4 weeks before discussing potential transition to other oral treatment 5/2 follow-up blood cultures negative  Atypical chest pain -Several days in duration with normal EKG -Suspect GI etiology therefore Protonix initiated and orders placed encourage upright after meals for at least 30 minutes  Acute toxic metabolic encephalopathy/ongoing delirium/subacute vs chronic infarct with dense insensate LUE hemiparesis Acute encephalopathy resolved -continue asa and  statin -Continue PT.  Bed ordered to help with positional dizziness intermittently associated with orthostasis  Stage III right buttock decubitus -Initially started as a deep tissue injury and has progressed to decubitus -Flagyl x10 days with last dose 5/6 -Wound care RN following with recommendations for Santyl  Type 2 diabetes mellitus --Home med: Glipizide --Hemoglobin A1c 6.8. --Lantus 35 u BID -off TFs so dc Novolog 8 u [place] q4h  Left IJ/subclavian DVT versus septic thrombus -Left IJ central line subsequently discontinued and RUE PICC line removed 4/24  -Repeat ultrasound duplex scan 4/25 with resolution of DVT. -Continue Lovenox therapeutic dose  Essential hypertension -Continue carvedilol   Recurrent pyelonephritis with obstructive hydronephrosis -s/p left ureteral stent 4/6.  -Repeat ultrasound with mild left hydronephrosis. CT abdomen with stable position of double-J stent. - Continue Foley catheter  Dysphagia/mild protein calorie malnutrition/morbid obesity -Continue D3 diet with nectar thick liquid-Magic Cup -5/2 transitioned to nocturnal tube feedings and calorie count initiated-has done well with recommendation to discontinue tube therefore tube removed on 5/4 -Estimated body mass index is 37.36 kg/m0  Weakness/deconditioning/debility -Continue PT/OT  -recommendation is for rehabilitation at short-term SNF -Unclear if can return to work given insensate LUE hemiparesis  Goals of care -Palliative care has evaluated patient. Prognosis has been relayed to his spouse on multiple occasions given his overall poor clinical condition.  Remains full code  NASH cirrhosis -Outpatient follow-up with gastroenterology  Mild hyponatremia -Resolved  Hypokalemia -Resolved  Acute renal failure -Resolved  HLD -Continue atorvastatin 40 mg daily  Procedures/significant Hospital events:  Intubation 3/31  Triple-lumen central line placement 3/31  4/1  off pressors. E coli bacteremia, e coli uti. Some improvement in pulm opacities on CXR. On rocephin. TTE with LVEF 40-45%, LV global hypokinesis, G2DD, normal RV, dilated aortic root at 42m.  Bronchoscopy with BAL 4/5, Dr. STamala Julian 4/5 extubated, failed immediately due to mucous plugging identified on bronch after re-intubated. CT ABD  with 14m left ureteral stone with mild edema of left kideny, large areas of consolidation in bilateral lung bases.   4/6 urology consulted for distal left ureteral stone, to OR overnight for stent placement, UCx sent. Versed added for agitation (on fent / precedex)  4/7 Off vasopressors  4/11 Vasopressors restarted   4/13: Left IJ holiday  4/15: Right IJ placed   4/16: extubated, remained agitated and restless requiring IV Precedex infusion  4/18: Cortraktube insertion and removed 5/4  Antimicrobials:   Daptomycin 4/26>>(End 6/5)  Cefazolin 4/4 - 4/6, 4/28>>(End 5/4)  Metronidazole 4/27>>(End 5/4)  Cefepime 4/6 - 4/7  Ceftriaxone 3/31 - 3/31, 4/7 - 4/11, 4/14 - 4/15  Vancomycin 3/31 - 3/31, 4/14 - 4/24, 4/26 - 4/28  Zosyn 3/31 - 3/31   Scheduled Meds: . aspirin  81 mg Oral Daily  . atorvastatin  40 mg Oral Daily  . carvedilol  3.125 mg Oral BID WC  . chlorhexidine  15 mL Mouth Rinse BID  . Chlorhexidine Gluconate Cloth  6 each Topical Daily  . collagenase   Topical Daily  . diclofenac Sodium  2 g Topical QID  . docusate  50 mg Oral Daily  . DULoxetine  30 mg Oral Daily  . enoxaparin (LOVENOX) injection  120 mg Subcutaneous Q12H  . feeding supplement  237 mL Oral TID BM  . insulin aspart  0-5 Units Subcutaneous QHS  . insulin aspart  0-9 Units Subcutaneous TID WC  . insulin detemir  35 Units Subcutaneous BID  . mouth rinse  15 mL Mouth Rinse q12n4p  . multivitamin with minerals  1 tablet Oral Daily  . oxyCODONE  10 mg Oral Q6H  . pantoprazole  40 mg Oral BID AC  . polyethylene glycol  17 g Oral Daily  . QUEtiapine  50 mg Oral  QHS  . sodium chloride flush  10-40 mL Intracatheter Q12H  . vitamin B-12  100 mcg Oral Daily  . zolpidem  10 mg Oral QHS   Continuous Infusions: . sodium chloride Stopped (12/31/20 1020)  . DAPTOmycin (CUBICIN)  IV 900 mg (01/15/21 2205)   PRN Meds:.sodium chloride, acetaminophen, hydrALAZINE, HYDROmorphone (DILAUDID) injection, ipratropium-albuterol, ondansetron (ZOFRAN) IV, Resource ThickenUp Clear  Diet Orders (From admission, onward)    Start     Ordered   01/14/21 0937  Diet regular Room service appropriate? Yes with Assist; Fluid consistency: Thin  Diet effective now       Question Answer Comment  Room service appropriate? Yes with Assist   Fluid consistency: Thin      01/14/21 0937          DVT prophylaxis: SCDs Start: 12/10/20 0737     Code Status: DNR  Family Communication: no family at bedside   Status is: Inpatient  Remains inpatient appropriate because:Inpatient level of care appropriate due to severity of illness   Dispo: The patient is from: Home              Anticipated d/c is to: TBD              Patient currently is not medically stable to d/c.   Difficult to place patient Yes  Level of care: Med-Surg  Consultants:  PCCM Urology Nephrology  Neurology ID   Objective: Vitals:   01/15/21 0715 01/15/21 1603 01/15/21 2105 01/16/21 0612  BP: 115/74 114/73 119/67 104/61  Pulse: (!) 106 (!) 101 99 (!) 107  Resp: _0 Temp: 99.8 F (37.7 C) 98.5  F (36.9 C) 98.5 F (36.9 C) 98.3 F (36.8 C)  TempSrc: Oral     SpO2: 95% 96% 95% 95%  Weight:      Height:        Intake/Output Summary (Last 24 hours) at 01/16/2021 1120 Last data filed at 01/16/2021 1610 Gross per 24 hour  Intake 631.27 ml  Output 2750 ml  Net -2118.73 ml   Filed Weights   12/29/20 0400 01/03/21 0500 01/09/21 0500  Weight: 126.7 kg 120.6 kg 132 kg    Examination:  Constitutional: NAD Respiratory: clear to auscultation bilaterally, no wheezing, no crackles.  Normal respiratory effort. Cardiovascular: Regular rate and rhythm, no murmurs / rubs / gallops. No LE edema.   Data Reviewed: I have independently reviewed following labs and imaging studies   CBC: Recent Labs  Lab 01/10/21 0250 01/11/21 0207  WBC 9.1 8.3  HGB 9.0* 8.6*  HCT 28.7* 27.4*  MCV 99.3 98.6  PLT 218 960   Basic Metabolic Panel: Recent Labs  Lab 01/10/21 0250 01/11/21 0207 01/13/21 0432  NA 143 140  --   K 4.0 3.6  --   CL 107 106  --   CO2 27 25  --   GLUCOSE 153* 88  --   BUN 38* 34*  --   CREATININE 0.82 0.77  --   CALCIUM 8.6* 8.7*  --   MG  --   --  1.9  PHOS  --   --  3.1   Liver Function Tests: No results for input(s): AST, ALT, ALKPHOS, BILITOT, PROT, ALBUMIN in the last 168 hours. Coagulation Profile: No results for input(s): INR, PROTIME in the last 168 hours. HbA1C: No results for input(s): HGBA1C in the last 72 hours. CBG: Recent Labs  Lab 01/15/21 0757 01/15/21 1151 01/15/21 1604 01/15/21 2101 01/16/21 0736  GLUCAP 90 90 117* 160* 103*    No results found for this or any previous visit (from the past 240 hour(s)).   Radiology Studies: No results found.   Marzetta Board, MD, PhD [facility]  Between 7 am - 7 pm I am available, please contact me via Amion (for emergencies) or Securechat (non urgent messages)  Between 7 pm - 7 am I am not available, please contact night coverage MD/APP via Amion

## 2021-01-17 LAB — CBC
HCT: 29.2 % — ABNORMAL LOW (ref 39.0–52.0)
Hemoglobin: 9.2 g/dL — ABNORMAL LOW (ref 13.0–17.0)
MCH: 31 pg (ref 26.0–34.0)
MCHC: 31.5 g/dL (ref 30.0–36.0)
MCV: 98.3 fL (ref 80.0–100.0)
Platelets: 267 10*3/uL (ref 150–400)
RBC: 2.97 MIL/uL — ABNORMAL LOW (ref 4.22–5.81)
RDW: 15.2 % (ref 11.5–15.5)
WBC: 7.8 10*3/uL (ref 4.0–10.5)
nRBC: 0 % (ref 0.0–0.2)

## 2021-01-17 LAB — GLUCOSE, CAPILLARY
Glucose-Capillary: 91 mg/dL (ref 70–99)
Glucose-Capillary: 147 mg/dL — ABNORMAL HIGH (ref 70–99)
Glucose-Capillary: 123 mg/dL — ABNORMAL HIGH (ref 70–99)
Glucose-Capillary: 151 mg/dL — ABNORMAL HIGH (ref 70–99)

## 2021-01-17 NOTE — Progress Notes (Signed)
PROGRESS NOTE  [name] VOZ:[id number] DOB: Sep 22, 1960 DOA: 12/10/2020 PCP: Pcp, No   LOS: 15 days   Brief Narrative / Interim history: 60 year old male with past medical history significant for essential hypertension, hyperlipidemia who presented to [facility] ED on 3/31 for a 5-day history of nausea/vomiting/diarrhea and confusion. Patient was initially admitted to [facility] service for acute hypoxic respiratory failure requiring intubation and ventilatory support with septic shock secondary to E. coli septicemia, UTI, MSSA Pneumonia, MRSE bacteremia (CLABSI involving left IJ vein and subclavian vein), with possible endovascular infection. Infectious disease followed during hospital course. Patient transferred from [facility] to [facility] on 01/01/2021 for further management of ongoing metabolic encephalopathy, tachycardia, dysphagia requiring tube feeds, intermittent fevers and prolonged antibiotic course.  Subjective / 24h Interval events: No complaints   Assessment & Plan: Acute problems: Acute hypoxic respiratory failure, MSSA pneumonia -Presented with hypoxia that required intubation associated with sepsis physiology.  Remains on room air  MRSE endovascular infection/septicemia E. coli UTI/septicemia Septic shock, POA -Infectious disease following, appreciate assistance -Continue daptomycin 900 mg IV daily x 6 weeks from 4/24(~02/14/21) *5/4 discussed with ID Dr. Montel Culver who recommended continuing IV antibiotics for 3 to 4 weeks before discussing potential transition to other oral treatment 5/2 follow-up blood cultures negative  Atypical chest pain -Several days in duration with normal EKG -Suspect GI etiology therefore Protonix initiated and orders placed encourage upright after meals for at least 30 minutes  Acute toxic metabolic encephalopathy/ongoing delirium/subacute vs chronic infarct with dense insensate LUE hemiparesis Acute encephalopathy resolved -continue asa and  statin -Continue PT.  Bed ordered to help with positional dizziness intermittently associated with orthostasis  Stage III right buttock decubitus -Initially started as a deep tissue injury and has progressed to decubitus -Flagyl x10 days with last dose 5/6 -Wound care RN following with recommendations for Santyl  Type 2 diabetes mellitus --Home med: Glipizide --Hemoglobin A1c 6.8. --Lantus 35 u BID -off TFs so dc Novolog 8 u [place] q4h  Left IJ/subclavian DVT versus septic thrombus -Left IJ central line subsequently discontinued and RUE PICC line removed 4/24  -Repeat ultrasound duplex scan 4/25 with resolution of DVT. -Continue Lovenox therapeutic dose  Essential hypertension -Continue carvedilol   Recurrent pyelonephritis with obstructive hydronephrosis -s/p left ureteral stent 4/6.  -Repeat ultrasound with mild left hydronephrosis. CT abdomen with stable position of double-J stent. - Continue Foley catheter  Dysphagia/mild protein calorie malnutrition/morbid obesity -Continue D3 diet with nectar thick liquid-Magic Cup -5/2 transitioned to nocturnal tube feedings and calorie count initiated-has done well with recommendation to discontinue tube therefore tube removed on 5/4 -Estimated body mass index is 37.36 kg/m0  Weakness/deconditioning/debility -Continue PT/OT  -recommendation is for rehabilitation at short-term SNF -Unclear if can return to work given insensate LUE hemiparesis  Goals of care -Palliative care has evaluated patient. Prognosis has been relayed to his spouse on multiple occasions given his overall poor clinical condition.  Remains full code  NASH cirrhosis -Outpatient follow-up with gastroenterology  Mild hyponatremia -Resolved  Hypokalemia -Resolved  Acute renal failure -Resolved  HLD -Continue atorvastatin 40 mg daily  Procedures/significant Hospital events:  Intubation 3/31  Triple-lumen central line placement 3/31  4/1  off pressors. E coli bacteremia, e coli uti. Some improvement in pulm opacities on CXR. On rocephin. TTE with LVEF 40-45%, LV global hypokinesis, G2DD, normal RV, dilated aortic root at 63m.  Bronchoscopy with BAL 4/5, Dr. STamala Julian 4/5 extubated, failed immediately due to mucous plugging identified on bronch after re-intubated. CT ABD with  40m left ureteral stone with mild edema of left kideny, large areas of consolidation in bilateral lung bases.   4/6 urology consulted for distal left ureteral stone, to OR overnight for stent placement, UCx sent. Versed added for agitation (on fent / precedex)  4/7 Off vasopressors  4/11 Vasopressors restarted   4/13: Left IJ holiday  4/15: Right IJ placed   4/16: extubated, remained agitated and restless requiring IV Precedex infusion  4/18: Cortraktube insertion and removed 5/4  Antimicrobials:   Daptomycin 4/26>>(End 6/5)  Cefazolin 4/4 - 4/6, 4/28>>(End 5/4)  Metronidazole 4/27>>(End 5/4)  Cefepime 4/6 - 4/7  Ceftriaxone 3/31 - 3/31, 4/7 - 4/11, 4/14 - 4/15  Vancomycin 3/31 - 3/31, 4/14 - 4/24, 4/26 - 4/28  Zosyn 3/31 - 3/31   Scheduled Meds: . aspirin  81 mg Oral Daily  . atorvastatin  40 mg Oral Daily  . carvedilol  3.125 mg Oral BID WC  . chlorhexidine  15 mL Mouth Rinse BID  . Chlorhexidine Gluconate Cloth  6 each Topical Daily  . collagenase   Topical Daily  . diclofenac Sodium  2 g Topical QID  . docusate  50 mg Oral Daily  . DULoxetine  30 mg Oral Daily  . enoxaparin (LOVENOX) injection  120 mg Subcutaneous Q12H  . feeding supplement  237 mL Oral TID BM  . insulin aspart  0-5 Units Subcutaneous QHS  . insulin aspart  0-9 Units Subcutaneous TID WC  . insulin detemir  35 Units Subcutaneous BID  . mouth rinse  15 mL Mouth Rinse q12n4p  . multivitamin with minerals  1 tablet Oral Daily  . oxyCODONE  10 mg Oral Q6H  . pantoprazole  40 mg Oral BID AC  . polyethylene glycol  17 g Oral Daily  . QUEtiapine  50 mg Oral  QHS  . sodium chloride flush  10-40 mL Intracatheter Q12H  . vitamin B-12  100 mcg Oral Daily  . zolpidem  10 mg Oral QHS   Continuous Infusions: . sodium chloride Stopped (12/31/20 1020)  . DAPTOmycin (CUBICIN)  IV 900 mg (01/16/21 2123)   PRN Meds:.sodium chloride, acetaminophen, hydrALAZINE, HYDROmorphone (DILAUDID) injection, ipratropium-albuterol, ondansetron (ZOFRAN) IV, Resource ThickenUp Clear  Diet Orders (From admission, onward)    Start     Ordered   01/14/21 0937  Diet regular Room service appropriate? Yes with Assist; Fluid consistency: Thin  Diet effective now       Question Answer Comment  Room service appropriate? Yes with Assist   Fluid consistency: Thin      01/14/21 0937          DVT prophylaxis: SCDs Start: 12/10/20 0737     Code Status: DNR  Family Communication: no family at bedside   Status is: Inpatient  Remains inpatient appropriate because:Inpatient level of care appropriate due to severity of illness   Dispo: The patient is from: Home              Anticipated d/c is to: TBD              Patient currently is not medically stable to d/c.   Difficult to place patient Yes  Level of care: Med-Surg  Consultants:  PCCM Urology Nephrology  Neurology ID   Objective: Vitals:   01/16/21 1541 01/16/21 2150 01/16/21 2200 01/17/21 0536  BP: 112/77 96/62  124/87  Pulse: 96 (!) 101 98 (!) 107  Resp: _0 Temp:  99.1 F (37.3 C)  99.4  F (37.4 C)  TempSrc:  Oral  Oral  SpO2: 94% 94%  94%  Weight:      Height:        Intake/Output Summary (Last 24 hours) at 01/17/2021 0726 Last data filed at 01/17/2021 0700 Gross per 24 hour  Intake 250 ml  Output 1650 ml  Net -1400 ml   Filed Weights   12/29/20 0400 01/03/21 0500 01/09/21 0500  Weight: 126.7 kg 120.6 kg 132 kg    Examination:  Constitutional: NAD Respiratory: CTA Cardiovascular: RRR, no edema   Data Reviewed: I have independently reviewed following labs and imaging  studies   CBC: Recent Labs  Lab 01/11/21 0207 01/17/21 0222  WBC 8.3 7.8  HGB 8.6* 9.2*  HCT 27.4* 29.2*  MCV 98.6 98.3  PLT 246 470   Basic Metabolic Panel: Recent Labs  Lab 01/11/21 0207 01/13/21 0432  NA 140  --   K 3.6  --   CL 106  --   CO2 25  --   GLUCOSE 88  --   BUN 34*  --   CREATININE 0.77  --   CALCIUM 8.7*  --   MG  --  1.9  PHOS  --  3.1   Liver Function Tests: No results for input(s): AST, ALT, ALKPHOS, BILITOT, PROT, ALBUMIN in the last 168 hours. Coagulation Profile: No results for input(s): INR, PROTIME in the last 168 hours. HbA1C: No results for input(s): HGBA1C in the last 72 hours. CBG: Recent Labs  Lab 01/15/21 2101 01/16/21 0736 01/16/21 1143 01/16/21 1559 01/16/21 2148  GLUCAP 160* 103* 117* 97 167*    No results found for this or any previous visit (from the past 240 hour(s)).   Radiology Studies: No results found.   Marzetta Board, MD, PhD [facility]  Between 7 am - 7 pm I am available, please contact me via Amion (for emergencies) or Securechat (non urgent messages)  Between 7 pm - 7 am I am not available, please contact night coverage MD/APP via Amion

## 2021-01-18 LAB — GLUCOSE, CAPILLARY
Glucose-Capillary: 92 mg/dL (ref 70–99)
Glucose-Capillary: 149 mg/dL — ABNORMAL HIGH (ref 70–99)
Glucose-Capillary: 136 mg/dL — ABNORMAL HIGH (ref 70–99)
Glucose-Capillary: 127 mg/dL — ABNORMAL HIGH (ref 70–99)
Glucose-Capillary: 128 mg/dL — ABNORMAL HIGH (ref 70–99)

## 2021-01-18 MED ORDER — APIXABAN 5 MG PO TABS
5.0000 mg | ORAL_TABLET | Freq: Two times a day (BID) | ORAL | Status: DC
  Administered 2021-01-18 – 2021-02-02 (×30): 5 mg via ORAL
  Filled 2021-01-18 (×30): qty 1

## 2021-01-18 NOTE — Consult Note (Signed)
WOC Nurse wound follow up Patient receiving care in [facility] 2W23. 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 Please see PT hydrotherapy notes for wound details.  Per PT, L. Kirkman, the wound is progressing nicely.  She anticipates completing hydrotherapy sessions this week.  Helmut Muster, RN, MSN, CWOCN, CNS-BC, pager [phone]

## 2021-01-18 NOTE — Progress Notes (Addendum)
ANTICOAGULATION CONSULT NOTE - Follow Up Consult  Pharmacy Consult for Heparin>>Lovenox>>apixaban Indication: Left IJ and subclavian non-occlusive clots  Labs: Recent Labs    01/17/21 0222  HGB 9.2*  HCT 29.2*  PLT 267   Assessment: 60 year old male admitted for hypoxemic respiratory failure and septic shock 2/2 E. Coli bacteremia and UTI, and ATN. Now with Left IJ and subclavian non-occlusive clots.   Pt is taking PO meds now. Will message Dr. Rhona Leavens to see if we can transition to PO apixaban.  Addendum  Ok to change to apixaban per Junious Silk, NP.  Goal of Therapy:   Monitor platelets by anticoagulation protocol: Yes   Plan:  Dc lovenox  Apixaban 5mg  PO BID  , PharmD, BCIDP, AAHIVP, CPP Infectious Disease Pharmacist 01/18/2021 10:16 AM

## 2021-01-18 NOTE — Progress Notes (Addendum)
[facility] PROGRESS NOTE  [name] JEH:[id number] DOB: 1961/06/30 DOA: 12/10/2020 PCP: Pcp, No  Status: Remains inpatient appropriate because:Altered mental status, Unsafe d/c plan, IV treatments appropriate due to intensity of illness or inability to take PO and Inpatient level of care appropriate due to severity of illness   Dispo: The patient is from: Home              Anticipated d/c is to: SNF-as of 5/5 no bed offers              Patient currently is not medically stable to d/c.   Difficult to place patient Yes                Level of care: Med-Surg  Code Status: Full Family Communication: Patient, wife and daughter on 5/4. DVT prophylaxis: Eliquis Vaccination status: Unvaccinated-CM spoke with wife who states that for political reasons patient has refused COVID vaccination  Foley catheter: Foley catheter discontinued on 5/4 with plans to utilize condom catheter  HPI: 60 year old male with past medical history significant for essential hypertension, hyperlipidemia who [facility] on 3/31 for a 5-day history of nausea/vomiting/diarrhea and confusion.  Patient was initially admitted to [facility] service for acute hypoxic respiratory failure requiring intubation and ventilatory support with septic shock secondary to E. coli septicemia, UTI, MSSA Pneumonia, MRSE bacteremia (CLABSI involving left IJ vein and subclavian vein), with possible endovascular infection.  Infectious disease followed during hospital course.  Patient transferred from [facility] to [facility] on 01/01/2021 for further management of ongoing metabolic encephalopathy, tachycardia, dysphagia requiring tube feeds, intermittent fevers and prolonged antibiotic course.  Procedures/significant Hospital events:   Intubation 3/31  Triple-lumen central line placement 3/31  4/1 off pressors. E coli bacteremia, e coli uti. Some improvement in pulm opacities on CXR. On rocephin. TTE with LVEF 40-45%, LV global  hypokinesis, G2DD, normal RV, dilated aortic root at 85m.  Bronchoscopy with BAL 4/5, Dr. STamala Julian 4/5 extubated, failed immediately due to mucous plugging identified on bronch after re-intubated. CT ABD with 440mleft ureteral stone with mild edema of left kideny, large areas of consolidation in bilateral lung bases.   4/6 urology consulted for distal left ureteral stone, to OR overnight for stent placement, UCx sent. Versed added for agitation (on fent / precedex)  4/7 Off vasopressors  4/11 Vasopressors restarted   4/13: Left IJ holiday  4/15: Right IJ placed   4/16: extubated, remained agitated and restless requiring IV Precedex infusion  4/18: Cortrak tube insertion and removed 5/4  Antimicrobials:   Daptomycin 4/26>> (End 6/5)  Cefazolin 4/4 - 4/6, 4/28>> (End 5/4)  Metronidazole 4/27>> (End 5/4)  Cefepime 4/6 - 4/7  Ceftriaxone 3/31 - 3/31, 4/7 - 4/11, 4/14 - 4/15  Vancomycin 3/31 - 3/31, 4/14 - 4/24, 4/26 - 4/28  Zosyn 3/31 - 3/31    Subjective: Awake and pleasant.  No specific complaints.  Aware he is not medically stable for discharge until is able to tolerate therapies better without complaints of severe dizziness, intermittent orthostasis and color changes.  Objective: Vitals:   01/17/21 1810 01/17/21 2035  BP: 118/90 118/77  Pulse: 94 99  Resp: 18 18  Temp: 98.5 F (36.9 C) 98.8 F (37.1 C)  SpO2: 96% 93%    Intake/Output Summary (Last 24 hours) at 01/18/2021 0733 Last data filed at 01/17/2021 2213 Gross per 24 hour  Intake 510 ml  Output 1000 ml  Net -490 ml   [employer]  12/29/20 0400 01/03/21 0500 01/09/21 0500  Weight: 126.7 kg 120.6 kg 132 kg    Exam:  Constitutional: Alert, calm, no acute distress Respiratory: Patient primarily laying supine in bed with clear lung sounds and no increased work of breathing.  Room air Cardiovascular: Normal heart sounds, good skin color while supine, regular pulse, regular pulse, no peripheral  edema Abdomen:  LBM 5/5,  Skin: Has stage III decubitus on hip Neurologic: Dense left upper extremity hemiparesis that is also insensate.  Moves all other extremities equally with strength 4/5. Psychiatric: Alert and oriented x3.  Pleasant affect.  Assessment/Plan: Acute problems: Acute hypoxic respiratory failure: Resolved MSSA pneumonia --Presented with hypoxia that required intubation associated with sepsis physiology  MRSE endovascular infection/septicemia E. coli UTI/septicemia Septic shock, POA --Infectious disease following, appreciate assistance --Continue daptomycin 900 mg IV daily x 6 weeks from 4/24 (~02/14/21) *5/4 discussed with ID Dr. Montel Culver who recommended continuing IV antibiotics for 3 to 4 weeks before discussing potential transition to other oral treatment 5/2 follow-up blood cultures negative  Atypical chest pain --Several days in duration with normal EKG --Suspect GI etiology -cont. Protonix initiated and encourage upright after meals for at least 30 minutes  Acute toxic metabolic encephalopathy/ongoing delirium/subacute vs chronic infarct with dense insensate LUE hemiparesis -Acute encephalopathy resolved --continue asa and statin -- Continue PT and KREG Bed ordered to help with positional dizziness intermittently associated with orthostasis  Stage III right buttock decubitus -Initially started as a deep tissue injury and has progressed to decubitus --Flagyl x10 days with last dose 5/6 --Wound care RN following -continue Santyl  Type 2 diabetes mellitus --Home med: Glipizide --Hemoglobin A1c 6.8. --Lantus 35 u BID   Left IJ/subclavian DVT versus septic thrombus -Left IJ central line subsequently discontinued and RUE PICC line removed 4/24   --Repeat ultrasound duplex scan 4/25 with resolution of DVT. --Continue Lovenox therapeutic dose  Essential hypertension --Continue carvedilol   Recurrent pyelonephritis with obstructive  hydronephrosis --s/p left ureteral stent 4/6.   --Repeat ultrasound with mild left hydronephrosis.  CT abdomen with stable position of double-J stent. -- Continue Foley catheter  Dysphagia/mild protein calorie malnutrition/morbid obesity -- Continue D3 diet with nectar thick liquid-Magic Cup --5/2 transitioned to nocturnal tube feedings and calorie count initiated-has done well with recommendation to discontinue tube therefore tube removed on 5/4 --Estimated body mass index is 37.36 kg/m as calculated from the following:   Height as of this encounter: _0  (1.88 m).   Weight as of this encounter: 132 kg.  Weakness/deconditioning/debility: --Continue PT/OT  -KREG tilt bed unavailable at this time.  Once available will be provided to patient to assist in therapy -recommendation is for rehabilitation at short-term SNF -Unclear if can return to work given insensate LUE hemiparesis  Goals of care: --Palliative care evaluated during hospitalization    Other problems: NASH cirrhosis:  Outpatient follow-up with gastroenterology  Mild hyponatremia --Resolved  Hypokalemia --Resolved  Acute renal failure -- Resolved  HLD:  -- Continue atorvastatin 40 mg daily      Data Reviewed: Basic Metabolic Panel: Recent Labs  Lab 01/13/21 0432  MG 1.9  PHOS 3.1   CBC: Recent Labs  Lab 01/17/21 0222  WBC 7.8  HGB 9.2*  HCT 29.2*  MCV 98.3  PLT 267   Cardiac Enzymes: Recent Labs  Lab 01/13/21 0432  CKTOTAL 36*    CBG: Recent Labs  Lab 01/17/21 0758 01/17/21 1202 01/17/21 1530 01/17/21 2209 01/18/21 0730  GLUCAP 91 147* 123* 151* 92  Studies: No results found.  Scheduled Meds: . aspirin  81 mg Oral Daily  . atorvastatin  40 mg Oral Daily  . carvedilol  3.125 mg Oral BID WC  . chlorhexidine  15 mL Mouth Rinse BID  . Chlorhexidine Gluconate Cloth  6 each Topical Daily  . collagenase   Topical Daily  . diclofenac Sodium  2 g Topical QID  .  docusate  50 mg Oral Daily  . DULoxetine  30 mg Oral Daily  . enoxaparin (LOVENOX) injection  120 mg Subcutaneous Q12H  . feeding supplement  237 mL Oral TID BM  . insulin aspart  0-5 Units Subcutaneous QHS  . insulin aspart  0-9 Units Subcutaneous TID WC  . insulin detemir  35 Units Subcutaneous BID  . mouth rinse  15 mL Mouth Rinse q12n4p  . multivitamin with minerals  1 tablet Oral Daily  . oxyCODONE  10 mg Oral Q6H  . pantoprazole  40 mg Oral BID AC  . polyethylene glycol  17 g Oral Daily  . QUEtiapine  50 mg Oral QHS  . sodium chloride flush  10-40 mL Intracatheter Q12H  . vitamin B-12  100 mcg Oral Daily  . zolpidem  10 mg Oral QHS   Continuous Infusions: . sodium chloride Stopped (12/31/20 1020)  . DAPTOmycin (CUBICIN)  IV 900 mg (01/17/21 2000)    Active Problems:   Acute respiratory failure (HCC)   AKI (acute kidney injury) ([place])   Bacteremia due to Escherichia coli   Ureteral stone   Pyelonephritis   Cerebral thrombosis with cerebral infarction   Staphylococcus epidermidis bacteremia   Pressure injury of skin   Dysphagia, pharyngoesophageal phase   Physical deconditioning   Consultants:  PCCM  Urology Dr. Claudia Desanctis, Dr. Matilde Sprang  Nephrology, Dr. Jonnie Finner, Dr. Augustin Coupe  Neurology, Dr. Leonie Man  Infectious disease    Procedures:  See above    Time spent: 20 minutes    Erin Hearing ANP  [facility] 7 am - 330 pm/M-F for direct patient care and secure chat Please refer to Amion for contact info 39  days

## 2021-01-18 NOTE — Progress Notes (Signed)
Physical Therapy Wound Treatment Patient Details  Name: [name] MRN: [id number] Date of Birth: September 14, 1960  Today's Date: 01/18/2021 Time: 9244-6286 Time Calculation (min): 28 min  Subjective  Subjective Assessment Subjective: Pleasant and agreeable Patient and Family Stated Goals: None stated throughout session. Date of Onset:  (Unknown) Prior Treatments:  (Dressing changes)  Pain Score:  Pt tolerated treatment without complaints of pain. No premedicated required.   Wound Assessment  Pressure Injury 12/21/20 Buttocks Right Deep Tissue Pressure Injury - Purple or maroon localized area of discolored intact skin or blood-filled blister due to damage of underlying soft tissue from pressure and/or shear. purple with blister area that has  (Active)  Dressing Type ABD;Barrier Film (skin prep);Gauze (Comment);Moist to moist;Santyl 01/18/21 1200  Dressing Changed;Clean;Dry;Intact 01/18/21 1200  Dressing Change Frequency Daily 01/18/21 1200  State of Healing Early/partial granulation 01/18/21 1200  Site / Wound Assessment Pink;Yellow 01/18/21 1200  % Wound base Red or Granulating 65% 01/18/21 1200  % Wound base Yellow/Fibrinous Exudate 35% 01/18/21 1200  % Wound base Black/Eschar 0% 01/18/21 1200  % Wound base Other/Granulation Tissue (Comment) 0% 01/18/21 1200  Peri-wound Assessment Intact 01/18/21 1200  Wound Length (cm) 10 cm 01/15/21 1100  Wound Width (cm) 8 cm 01/15/21 1100  Wound Depth (cm) 6 cm 01/15/21 1100  Wound Surface Area (cm^2) 80 cm^2 01/15/21 1100  Wound Volume (cm^3) 480 cm^3 01/15/21 1100  Tunneling (cm) 0 01/15/21 1441  Undermining (cm) 0 01/15/21 1441  Margins Unattached edges (unapproximated) 01/18/21 1200  Drainage Amount Minimal 01/18/21 1200  Drainage Description Serosanguineous 01/18/21 1200  Treatment Debridement (Selective);Hydrotherapy (Pulse lavage);Packing (Saline gauze) 01/18/21 1200      Hydrotherapy Pulsed lavage therapy - wound location: R  buttock Pulsed Lavage with Suction (psi): 12 psi Pulsed Lavage with Suction - Normal Saline Used: 1000 mL Pulsed Lavage Tip: Tip with splash shield Selective Debridement Selective Debridement - Location: R buttock Selective Debridement - Tools Used: Forceps,Scalpel,Scissors Selective Debridement - Tissue Removed: unviable tissue    Wound Assessment and Plan  Wound Therapy - Assess/Plan/Recommendations Wound Therapy - Clinical Statement: Progressing well with debridement and overall wound appearance is improved. Continue with curent hydrotherapy POC with the hope of transitioning to dressing changes by RN by the end of the week. Wound Therapy - Functional Problem List: Global weakness in the setting of critical illness and extended ICU stay. Factors Delaying/Impairing Wound Healing: Immobility,Multiple medical problems,Polypharmacy,Infection - systemic/local Hydrotherapy Plan: Dressing change,Patient/family education,Pulsatile lavage with suction,Debridement Wound Therapy - Frequency: 3X / week (hydrotherapy MWF with nursing to continue with daily dressing changes in between.) Wound Therapy - Follow Up Recommendations: dressing changes by RN  Wound Therapy Goals- Improve the function of patient's integumentary system by progressing the wound(s) through the phases of wound healing (inflammation - proliferation - remodeling) by: Wound Therapy Goals - Improve the function of patient's integumentary system by progressing the wound(s) through the phases of wound healing by: Decrease Necrotic Tissue to: 25% Decrease Necrotic Tissue - Progress: Progressing toward goal Increase Granulation Tissue to: 75% Increase Granulation Tissue - Progress: Progressing toward goal Improve Drainage Characteristics: Min,Serous Improve Drainage Characteristics - Progress: Progressing toward goal Goals/treatment plan/discharge plan were made with and agreed upon by patient/family: No, Patient unable to participate  in goals/treatment/discharge plan and family unavailable Time For Goal Achievement: 7 days Wound Therapy - Potential for Goals: Good  Goals will be updated until maximal potential achieved or discharge criteria met.  Discharge criteria: when goals achieved, discharge from hospital, MD decision/surgical intervention,  no progress towards goals, refusal/missing three consecutive treatments without notification or medical reason.  GP     Charges PT Wound Care Charges $Wound Debridement up to 20 cm: < or equal to 20 cm $ Wound Debridement each add'l 20 sqcm: 1 $PT PLS Gun and Tip: 1 Supply $PT Hydrotherapy Visit: 1 Visit       Thelma Comp 01/18/2021, 12:27 PM   Rolinda Roan, PT, DPT Acute Rehabilitation Services Pager: [phone] Office: [phone]

## 2021-01-18 NOTE — Progress Notes (Signed)
Physical Therapy Treatment Patient Details Name: [name] MRN: [id number] DOB: 1961/08/22 Today's Date: 01/18/2021    History of Present Illness 60 year old white male who presented to the emergency room from home 12/10/20.  Pt had 4-5 days of N/V/D. Pt became more confused and at times belligerent. Admitted for hypoxemic respiratory failure and septic shock 2/2 E. Coli bacteremia and UTI and was intubated. Extubated 4/16, now on cortrak. dense LUE paresis; CT head "late subacute to chronic infarct within the  posterior right frontal lobe"    PT Comments    Patient primarily limited by low back pain (increased to 8 out of 10) with elevating HOB from 0 to 45 degrees. Patient also with drop in BP, although denied dizziness. Returned to supine and performed exercises in sidelying (his position of comfort for his back). Noted order for tilt bed in chart yet bed has not been delivered. Will call to ensure bed is delivered.  HOB zero BP 118/74 HOB 45    BP  98/76 HOB zero after exercises 105/63    Follow Up Recommendations  SNF     Equipment Recommendations  None recommended by PT    Recommendations for Other Services       Precautions / Restrictions Precautions Precautions: Fall;Other (comment) Precaution Comments: foley catheter    Mobility  Bed Mobility Overal bed mobility: Needs Assistance Bed Mobility: Rolling Rolling: Supervision         General bed mobility comments: rolling to right with cues to bring left arm across body; rolling to left with cues to protect LUE; elevated HOB in supine to slowly come to upright due to previous orthostasis. Pt reporting 8/10 back pain with HOB elevated to 45 degrees.    Transfers                    Ambulation/Gait                 Stairs             Wheelchair Mobility    Modified Rankin (Stroke Patients Only)       Balance                                            Cognition  Arousal/Alertness: Awake/alert Behavior During Therapy: Flat affect Overall Cognitive Status: Within Functional Limits for tasks assessed                                 General Comments: following simple commands with some delay; thanking therapist for the assistance; asking if his arm will ever recover      Exercises General Exercises - Lower Extremity Ankle Circles/Pumps: AROM;Both;10 reps;Seated Heel Slides: AROM;Both;10 reps Hip ABduction/ADduction: AROM;Both;15 reps;Sidelying ("clamshells") Other Exercises Other Exercises: sidelying resisted knee flexion and knee extension x 10 reps each leg Other Exercises: LUE able to perform shoulder elevation, shoulder protraction, and adduction in supine    General Comments        Pertinent Vitals/Pain Pain Assessment: 0-10 Pain Score: 8  Pain Location: back Pain Descriptors / Indicators: Restless;Grimacing Pain Intervention(s): Limited activity within patient's tolerance;Repositioned    Home Living Family/patient expects to be discharged to:: Unsure               Additional Comments: Pt lived with wife at  home per chart.    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   PT Goals (current goals can now be found in the care plan section) Acute Rehab PT Goals Patient Stated Goal: agrees to incr mobility Time For Goal Achievement: 01/15/21 Potential to Achieve Goals: Fair Progress towards PT goals: Not progressing toward goals - comment (limited by back pain and orthostasis)    Frequency    Min 2X/week      PT Plan Current plan remains appropriate    Co-evaluation              AM-PAC PT "6 Clicks" Mobility   Outcome Measure  Help needed turning from your back to your side while in a flat bed without using bedrails?: A Little Help needed moving from lying  on your back to sitting on the side of a flat bed without using bedrails?: Total Help needed moving to and from a bed to a chair (including a wheelchair)?: Total Help needed standing up from a chair using your arms (e.g., wheelchair or bedside chair)?: Total Help needed to walk in hospital room?: Total Help needed climbing 3-5 steps with a railing? : Total 6 Click Score: 8    End of Session   Activity Tolerance: Patient limited by pain Patient left: with call bell/phone within reach;in bed Nurse Communication: Patient requests pain meds PT Visit Diagnosis: Muscle weakness (generalized) (M62.81);Difficulty in walking, not elsewhere classified (R26.2);Other abnormalities of gait and mobility (R26.89)     Time: 1030-1100 PT Time Calculation (min) (ACUTE ONLY): 30 min  Charges:  $Therapeutic Exercise: 23-37 mins                      Jerolyn Center, PT Pager [phone]    Zena Amos 01/18/2021, 12:54 PM

## 2021-01-19 LAB — GLUCOSE, CAPILLARY
Glucose-Capillary: 83 mg/dL (ref 70–99)
Glucose-Capillary: 107 mg/dL — ABNORMAL HIGH (ref 70–99)
Glucose-Capillary: 140 mg/dL — ABNORMAL HIGH (ref 70–99)
Glucose-Capillary: 141 mg/dL — ABNORMAL HIGH (ref 70–99)
Glucose-Capillary: 147 mg/dL — ABNORMAL HIGH (ref 70–99)

## 2021-01-19 LAB — CK: Total CK: 41 U/L — ABNORMAL LOW (ref 49–397)

## 2021-01-19 MED ORDER — MAGNESIUM HYDROXIDE 400 MG/5ML PO SUSP
30.0000 mL | Freq: Once | ORAL | Status: AC
  Administered 2021-01-19: 30 mL via ORAL
  Filled 2021-01-19: qty 30

## 2021-01-19 NOTE — Progress Notes (Signed)
Thank you for consult on [name]. Chart reviewed and note that patient is currently limited by pain and orthostatic changes to bed mobility. He has had prolonged stay with significant deficits and agree with recommendations of SNF level therapy as patient does not have ability to tolerate 3 hrs/day of therapy at this point.

## 2021-01-19 NOTE — Plan of Care (Signed)
  Problem: Education: Goal: Knowledge of General Education information will improve Description: Including pain rating scale, medication(s)/side effects and non-pharmacologic comfort measures Outcome: Progressing   Problem: Health Behavior/Discharge Planning: Goal: Ability to manage health-related needs will improve Outcome: Progressing   Problem: Clinical Measurements: Goal: Ability to maintain clinical measurements within normal limits will improve Outcome: Progressing Goal: Will remain free from infection Outcome: Progressing Goal: Diagnostic test results will improve Outcome: Progressing Goal: Respiratory complications will improve Outcome: Progressing Goal: Cardiovascular complication will be avoided Outcome: Progressing   Problem: Activity: Goal: Risk for activity intolerance will decrease Outcome: Progressing   Problem: Nutrition: Goal: Adequate nutrition will be maintained Outcome: Progressing   Problem: Coping: Goal: Level of anxiety will decrease Outcome: Progressing   Problem: Elimination: Goal: Will not experience complications related to bowel motility Outcome: Progressing Goal: Will not experience complications related to urinary retention Outcome: Progressing   Problem: Pain Managment: Goal: General experience of comfort will improve Outcome: Progressing   Problem: Safety: Goal: Ability to remain free from injury will improve Outcome: Progressing   Problem: Skin Integrity: Goal: Risk for impaired skin integrity will decrease Outcome: Progressing   Problem: Safety: Goal: Non-violent Restraint(s) Outcome: Progressing   Problem: Education: Goal: Knowledge of disease or condition will improve Outcome: Progressing Goal: Knowledge of secondary prevention will improve Outcome: Progressing Goal: Knowledge of patient specific risk factors addressed and post discharge goals established will improve Outcome: Progressing   Problem: Self-Care: Goal:  Verbalization of feelings and concerns over difficulty with self-care will improve Outcome: Progressing   Problem: Nutrition: Goal: Dietary intake will improve Outcome: Progressing   

## 2021-01-19 NOTE — TOC Progression Note (Addendum)
Transition of Care [facility]) - Progression Note    Patient Details  Name: [name] MRN: [id number] Date of Birth: 04/30/1961  [facility] CM/SW Contact  Janae Bridgeman, RN Phone Number: 01/19/2021, 7:49 AM  Clinical Narrative:    Case management spoke with Gretta Cool, MSW supervisor and email was sent to the corporate division for admissions to evaluate for possible bed offer at [facility] or [facility].  No bed offers at this time for placement along with administration of IV antibiotics.  A voicemail was left with [facility] this morning to review for possible admission.  01/19/2021 0806 - CM spoke with Lowella Bandy, CM at [facility] and she will review and check with corporate office today.  Lowella Bandy, CM with [facility] states that a barrier to admission includes patient's lack of vaccination for COVID and expense of Daptomycin IV cost.  Junious Silk, NP is aware and will speak to the patient about vaccination today.  The patient would need to be quarantined for 2 weeks after vaccination if agreed.  Rennis Harding, NP will also place an evaluation for CIR in the meantime to exhaust resources for admission for rehabilitation for patient.  CM and MSW will continue to follow for admission to accepting SNF facility.  I spoke with [place], CM and [place] [facility] [place], CM at [facility] and both facilities are reviewing the patient's clinicals for possible admission.Marland Kitchen CIR is not an option for the patient at this point since patient would not be able to tolerate hours of intensive therapy each day.  Junious Silk, NP spoke with the patient regarding COVID vaccine and patient is considering but has not given permission for vaccination at this point.  CM and MSW will continue to search for SNF options for admission.    Expected Discharge Plan: Skilled Nursing Facility Barriers to Discharge: Continued Medical Work up,Inadequate or no insurance,SNF Pending payor  source - [facility]  Expected Discharge Plan and Services Expected Discharge Plan: Skilled Nursing Facility In-house Referral: Clinical Social Work Discharge Planning Services: CM Consult Post Acute Care Choice: Skilled Nursing Facility Living arrangements for the past 2 months: Single Family Home                                       Social Determinants of Health (SDOH) Interventions    Readmission Risk Interventions Readmission Risk Prevention Plan 01/11/2021  Transportation Screening Complete  PCP or Specialist Appt within 3-5 Days Complete  HRI or Home Care Consult Complete  Social Work Consult for Recovery Care Planning/Counseling Complete  Palliative Care Screening Complete  Medication Review [employer]) Complete

## 2021-01-19 NOTE — Progress Notes (Addendum)
[facility] PROGRESS NOTE  Shayden Bobier UUV:[id number] DOB: [birth year] DOA: 12/10/2020 PCP: Pcp, No  Status: Remains inpatient appropriate because:Altered mental status, Unsafe d/c plan, IV treatments appropriate due to intensity of illness or inability to take PO and Inpatient level of care appropriate due to severity of illness   Dispo: The patient is from: Home              Anticipated d/c is to: SNF-as of 5/5 no bed offers              Patient currently is not medically stable to d/c.   Difficult to place patient Yes                Level of care: Med-Surg  Code Status: Full Family Communication: Patient, wife and daughter on 5/4. DVT prophylaxis: Eliquis Vaccination status: Unvaccinated-CM spoke with wife who states that for political reasons patient has refused COVID vaccination.  5/10 discussed with patient about COVID-vaccine and he is willing to think about simply receiving the vaccine.   HPI: 60 year old male with past medical history significant for essential hypertension, hyperlipidemia who presented to [facility] ED on 3/31 for a 5-day history of nausea/vomiting/diarrhea and confusion.  Patient was initially admitted to [facility] service for acute hypoxic respiratory failure requiring intubation and ventilatory support with septic shock secondary to E. coli septicemia, UTI, MSSA Pneumonia, MRSE bacteremia (CLABSI involving left IJ vein and subclavian vein), with possible endovascular infection.  Infectious disease followed during hospital course.  Patient transferred from [facility] to [facility] on 01/01/2021 for further management of ongoing metabolic encephalopathy, tachycardia, dysphagia requiring tube feeds, intermittent fevers and prolonged antibiotic course.  Procedures/significant Hospital events:   Intubation 3/31  Triple-lumen central line placement 3/31  4/1 off pressors. E coli bacteremia, e coli uti. Some improvement in pulm opacities on CXR. On rocephin. TTE with  LVEF 40-45%, LV global hypokinesis, G2DD, normal RV, dilated aortic root at 19m.  Bronchoscopy with BAL 4/5, Dr. STamala Julian 4/5 extubated, failed immediately due to mucous plugging identified on bronch after re-intubated. CT ABD with 416mleft ureteral stone with mild edema of left kideny, large areas of consolidation in bilateral lung bases.   4/6 urology consulted for distal left ureteral stone, to OR overnight for stent placement, UCx sent. Versed added for agitation (on fent / precedex)  4/7 Off vasopressors  4/11 Vasopressors restarted   4/13: Left IJ holiday  4/15: Right IJ placed   4/16: extubated, remained agitated and restless requiring IV Precedex infusion  4/18: Cortrak tube insertion and removed 5/4  Antimicrobials:   Daptomycin 4/26>> (End 6/5)  Cefazolin 4/4 - 4/6, 4/28>> (End 5/4)  Metronidazole 4/27>> (End 5/4)  Cefepime 4/6 - 4/7  Ceftriaxone 3/31 - 3/31, 4/7 - 4/11, 4/14 - 4/15  Vancomycin 3/31 - 3/31, 4/14 - 4/24, 4/26 - 4/28  Zosyn 3/31 - 3/31    Subjective: Awake and pleasant.  Initially confused me for one of the staff nurses but was reoriented and then remembered my role.  Discussed rationale for COVID-vaccine.  Patient is still thinking about this.  He states that he had COVID and was sick for 3 weeks back in March but when he presented to the hospital his COVID screening was negative.  I will go ahead and check COVID antibodies.  Objective: Vitals:   01/18/21 1243 01/18/21 2054  BP: 102/71 105/74  Pulse: (!) 103 99  Resp: 18 20  Temp: 98.7 F (37.1 C) 98.4 F (36.9 C)  SpO2: 93% 98%    Intake/Output Summary (Last 24 hours) at 01/19/2021 0737 Last data filed at 01/19/2021 0618 Gross per 24 hour  Intake 436 ml  Output 1225 ml  Net -789 ml   Filed Weights   01/03/21 0500 01/09/21 0500 01/19/21 0700  Weight: 120.6 kg 132 kg 118.7 kg    Exam:  Constitutional: Awake, pleasant, no acute distress Respiratory: Anterior lung sounds are  clear to auscultation and he remained stable on room air Cardiovascular: Normal heart sounds, no peripheral edema, regular pulse Abdomen:  LBM 5/5, soft, nontender with normoactive bowel sounds.  Eating well. Skin: Has stage III decubitus on hip Neurologic: Dense left upper extremity hemiparesis that is also insensate.  Moves all other extremities equally with strength 4/5. Psychiatric: Alert and oriented x 3.  As noted above was mildly confused earlier but easily reoriented.  Pleasant affect.  Assessment/Plan: Acute problems: Acute hypoxic respiratory failure: Resolved MSSA pneumonia --Presented with hypoxia s/p mechanical ventilation due to sepsis physiology  MRSE endovascular infection/septicemia E. coli UTI/septicemia Septic shock, POA --Infectious disease following, appreciate assistance --Continue daptomycin 900 mg IV daily x 6 weeks from 4/24 (~02/14/21) *5/4 discussed with ID Dr. Montel Culver who recommended continuing IV antibiotics for 3 to 4 weeks before discussing potential transition to other oral treatment 5/2 follow-up blood cultures negative  Atypical chest pain --Suspect GI etiology -cont. Protonix initiated and encourage upright after meals for at least 30 minutes  Acute toxic metabolic encephalopathy/ongoing delirium/subacute vs chronic infarct with dense insensate LUE hemiparesis -Acute encephalopathy resolved --continue asa and statin -- Continue PT; KREG Bed ordered but not yet available   Stage III right buttock decubitus -Initially started as a deep tissue injury and has progressed to decubitus --Flagyl x10 days with last dose 5/6 --Wound care RN following -continue Santyl  Type 2 diabetes mellitus --Home med: Glipizide --Hemoglobin A1c 6.8. --Lantus 35 u BID   Mild constipation --No bowel movement since 5/5 --One-time dose of milk of magnesia, continue dosing of MiraLAX and Colace  Left IJ/subclavian DVT versus septic thrombus -Left IJ central line  subsequently discontinued and RUE PICC line removed 4/24   --Repeat ultrasound duplex scan 4/25 with resolution of DVT. --Continue Lovenox therapeutic dose  Essential hypertension --Continue carvedilol   Recurrent pyelonephritis with obstructive hydronephrosis --s/p left ureteral stent 4/6.   --Repeat ultrasound with mild left hydronephrosis.  CT abdomen with stable position of double-J stent.  Dysphagia/mild protein calorie malnutrition/morbid obesity -- Continue D3 diet with nectar thick liquid-Magic Cup --5/2 transitioned to nocturnal tube feedings and calorie count initiated-has done well with recommendation to discontinue tube therefore tube removed on 5/4 --Estimated body mass index is 33.6 kg/m as calculated from the following:   Height as of this encounter: _0  (1.88 m).   Weight as of this encounter: 118.7 kg.  Weakness/deconditioning/debility: --Continue PT/OT  -KREG tilt bed unavailable at this time.  Once available will be provided to patient to assist in therapy -recommendation is for rehabilitation at short-term ; patient is unvaccinated and is still uncertain if he wishes to receive a COVID-vaccine.  He thinks he may have already had COVID therefore will check SARS antibodies -Unclear if can return to work given insensate LUE hemiparesis -5/10 have asked for CIR to evaluate patient for possible admission  Goals of care: --Palliative care evaluated during hospitalization    Other problems: NASH cirrhosis:  Outpatient follow-up with gastroenterology  Mild hyponatremia --Resolved  Hypokalemia --Resolved  Acute renal failure -- Resolved  HLD:  -- Continue atorvastatin 40 mg daily      Data Reviewed: Basic Metabolic Panel: Recent Labs  Lab 01/13/21 0432  MG 1.9  PHOS 3.1   CBC: Recent Labs  Lab 01/17/21 0222  WBC 7.8  HGB 9.2*  HCT 29.2*  MCV 98.3  PLT 267   Cardiac Enzymes: Recent Labs  Lab 01/13/21 0432  CKTOTAL 36*     CBG: Recent Labs  Lab 01/18/21 1242 01/18/21 1530 01/18/21 2055 01/18/21 2318 01/19/21 0308  GLUCAP 149* 136* 127* 128* 83     Studies: No results found.  Scheduled Meds: . apixaban  5 mg Oral BID  . aspirin  81 mg Oral Daily  . atorvastatin  40 mg Oral Daily  . carvedilol  3.125 mg Oral BID WC  . chlorhexidine  15 mL Mouth Rinse BID  . Chlorhexidine Gluconate Cloth  6 each Topical Daily  . collagenase   Topical Daily  . diclofenac Sodium  2 g Topical QID  . docusate  50 mg Oral Daily  . DULoxetine  30 mg Oral Daily  . feeding supplement  237 mL Oral TID BM  . insulin aspart  0-5 Units Subcutaneous QHS  . insulin aspart  0-9 Units Subcutaneous TID WC  . insulin detemir  35 Units Subcutaneous BID  . mouth rinse  15 mL Mouth Rinse q12n4p  . multivitamin with minerals  1 tablet Oral Daily  . oxyCODONE  10 mg Oral Q6H  . pantoprazole  40 mg Oral BID AC  . polyethylene glycol  17 g Oral Daily  . QUEtiapine  50 mg Oral QHS  . sodium chloride flush  10-40 mL Intracatheter Q12H  . vitamin B-12  100 mcg Oral Daily  . zolpidem  10 mg Oral QHS   Continuous Infusions: . sodium chloride Stopped (12/31/20 1020)  . DAPTOmycin (CUBICIN)  IV Stopped (01/18/21 2122)    Active Problems:   Acute respiratory failure (HCC)   AKI (acute kidney injury) ([place])   Bacteremia due to Escherichia coli   Ureteral stone   Pyelonephritis   Cerebral thrombosis with cerebral infarction   Staphylococcus epidermidis bacteremia   Pressure injury of skin   Dysphagia, pharyngoesophageal phase   Physical deconditioning   Consultants:  PCCM  Urology Dr. Claudia Desanctis, Dr. Matilde Sprang  Nephrology, Dr. Jonnie Finner, Dr. Augustin Coupe  Neurology, Dr. Leonie Man  Infectious disease    Procedures:  See above    Time spent: 20 minutes    Erin Hearing ANP  [facility] 7 am - 330 pm/M-F for direct patient care and secure chat Please refer to [place] for contact info 40  days

## 2021-01-19 NOTE — Progress Notes (Signed)
Physical Therapy Treatment Patient Details Name: [name] MRN: [id number] DOB: Oct 12, 1960 Today's Date: 01/19/2021    History of Present Illness 60 year old white male who presented to the emergency room from home 12/10/20.  Pt had 4-5 days of N/V/D. Pt became more confused and at times belligerent. Admitted for hypoxemic respiratory failure and septic shock 2/2 E. Coli bacteremia and UTI and was intubated. Extubated 4/16, now on cortrak. dense LUE paresis; CT head "late subacute to chronic infarct within the  posterior right frontal lobe"    PT Comments    Met with [employer] rep, Hart Carwin, and performed initial tilting maneuver with patient. Surprisingly his BP was generally steady with slow, incremental increases up to 44 degrees. Patient then tried to reach down for something and had severe pain in his back and had to be returned to 0 degrees for comfort. Will coordinate with OT and [employer] rep to try to ensure daily tilting.       Follow Up Recommendations  SNF     Equipment Recommendations  None recommended by PT    Recommendations for Other Services       Precautions / Restrictions Precautions Precautions: Fall;Other (comment)    Mobility  Bed Mobility                 Start Time: 1307 Angle: 44 degrees Total Minutes in Angle: 30 minutes (elevating by 5-10 degrees at a time up to 44 degrees over 30 minutes; @ 44 x 4 minutes) Patient Response: Cooperative  Transfers                 General transfer comment: see tilt bed information  Ambulation/Gait             General Gait Details: unable   Stairs             Wheelchair Mobility    Modified Rankin (Stroke Patients Only)       Balance Overall balance assessment: Needs assistance                                          Cognition Arousal/Alertness: Awake/alert Behavior During Therapy: WFL for tasks assessed/performed Overall Cognitive Status: Within  Functional Limits for tasks assessed                                 General Comments: pt excited/animated re: trying out his tilt bed      Exercises Other Exercises Other Exercises: alternating toe taps in upright position    General Comments General comments (skin integrity, edema, etc.): [employer] rep Hart Carwin) present throughout session      Pertinent Vitals/Pain Pain Assessment: 0-10 Pain Score: 6  Pain Location: back Pain Descriptors / Indicators: Restless;Grimacing Pain Intervention(s): Limited activity within patient's tolerance;Monitored during session;Premedicated before session;Repositioned    Home Living                      Prior Function            PT Goals (current goals can now be found in the care plan section) Acute Rehab PT Goals Patient Stated Goal: agrees to incr mobility PT Goal Formulation: With patient Time For Goal Achievement: 02/02/21 Potential to Achieve Goals: Fair Progress towards PT goals: Progressing toward goals;Goals downgraded-see care plan  Frequency    Min 2X/week      PT Plan Current plan remains appropriate    Co-evaluation              AM-PAC PT "6 Clicks" Mobility   Outcome Measure  Help needed turning from your back to your side while in a flat bed without using bedrails?: A Little Help needed moving from lying on your back to sitting on the side of a flat bed without using bedrails?: Total Help needed moving to and from a bed to a chair (including a wheelchair)?: Total Help needed standing up from a chair using your arms (e.g., wheelchair or bedside chair)?: Total Help needed to walk in hospital room?: Total Help needed climbing 3-5 steps with a railing? : Total 6 Click Score: 8    End of Session   Activity Tolerance: Patient limited by pain Patient left: with call bell/phone within reach;in bed Nurse Communication: Mobility status PT Visit Diagnosis: Muscle weakness  (generalized) (M62.81);Difficulty in walking, not elsewhere classified (R26.2);Other abnormalities of gait and mobility (R26.89)     Time: 1255-1346 PT Time Calculation (min) (ACUTE ONLY): 51 min  Charges:  $Therapeutic Activity: 38-52 mins                      Arby Barrette, PT Pager [phone]    Rexanne Mano 01/19/2021, 3:41 PM

## 2021-01-19 NOTE — Plan of Care (Signed)
Problem: Education: Goal: Knowledge of General Education information will improve Description: Including pain rating scale, medication(s)/side effects and non-pharmacologic comfort measures 01/19/2021 1058 by Netta Cedars D, LPN Outcome: Progressing 01/19/2021 1058 by Netta Cedars D, LPN Outcome: Progressing   Problem: Health Behavior/Discharge Planning: Goal: Ability to manage health-related needs will improve 01/19/2021 1058 by Netta Cedars D, LPN Outcome: Progressing 01/19/2021 1058 by Netta Cedars D, LPN Outcome: Progressing   Problem: Clinical Measurements: Goal: Ability to maintain clinical measurements within normal limits will improve 01/19/2021 1058 by Netta Cedars D, LPN Outcome: Progressing 01/19/2021 1058 by Netta Cedars D, LPN Outcome: Progressing Goal: Will remain free from infection 01/19/2021 1058 by Netta Cedars D, LPN Outcome: Progressing 01/19/2021 1058 by Netta Cedars D, LPN Outcome: Progressing Goal: Diagnostic test results will improve 01/19/2021 1058 by Netta Cedars D, LPN Outcome: Progressing 01/19/2021 1058 by Netta Cedars D, LPN Outcome: Progressing Goal: Respiratory complications will improve 01/19/2021 1058 by Netta Cedars D, LPN Outcome: Progressing 01/19/2021 1058 by Netta Cedars D, LPN Outcome: Progressing Goal: Cardiovascular complication will be avoided 01/19/2021 1058 by Netta Cedars D, LPN Outcome: Progressing 01/19/2021 1058 by Netta Cedars D, LPN Outcome: Progressing   Problem: Activity: Goal: Risk for activity intolerance will decrease 01/19/2021 1058 by Netta Cedars D, LPN Outcome: Progressing 01/19/2021 1058 by Netta Cedars D, LPN Outcome: Progressing   Problem: Nutrition: Goal: Adequate nutrition will be maintained 01/19/2021 1058 by Netta Cedars D, LPN Outcome: Progressing 01/19/2021 1058 by Netta Cedars D, LPN Outcome: Progressing   Problem: Coping: Goal: Level of anxiety will decrease 01/19/2021 1058 by Netta Cedars D, LPN Outcome:  Progressing 01/19/2021 1058 by Netta Cedars D, LPN Outcome: Progressing   Problem: Elimination: Goal: Will not experience complications related to bowel motility 01/19/2021 1058 by Netta Cedars D, LPN Outcome: Progressing 01/19/2021 1058 by Netta Cedars D, LPN Outcome: Progressing Goal: Will not experience complications related to urinary retention 01/19/2021 1058 by Netta Cedars D, LPN Outcome: Progressing 01/19/2021 1058 by Netta Cedars D, LPN Outcome: Progressing   Problem: Pain Managment: Goal: General experience of comfort will improve 01/19/2021 1058 by Netta Cedars D, LPN Outcome: Progressing 01/19/2021 1058 by Netta Cedars D, LPN Outcome: Progressing   Problem: Safety: Goal: Ability to remain free from injury will improve 01/19/2021 1058 by Netta Cedars D, LPN Outcome: Progressing 01/19/2021 1058 by Netta Cedars D, LPN Outcome: Progressing   Problem: Skin Integrity: Goal: Risk for impaired skin integrity will decrease 01/19/2021 1058 by Netta Cedars D, LPN Outcome: Progressing 01/19/2021 1058 by Netta Cedars D, LPN Outcome: Progressing   Problem: Safety: Goal: Non-violent Restraint(s) 01/19/2021 1058 by Netta Cedars D, LPN Outcome: Progressing 01/19/2021 1058 by Netta Cedars D, LPN Outcome: Progressing   Problem: Education: Goal: Knowledge of disease or condition will improve 01/19/2021 1058 by Netta Cedars D, LPN Outcome: Progressing 01/19/2021 1058 by Netta Cedars D, LPN Outcome: Progressing Goal: Knowledge of secondary prevention will improve 01/19/2021 1058 by Netta Cedars D, LPN Outcome: Progressing 01/19/2021 1058 by Netta Cedars D, LPN Outcome: Progressing Goal: Knowledge of patient specific risk factors addressed and post discharge goals established will improve 01/19/2021 1058 by Netta Cedars D, LPN Outcome: Progressing 01/19/2021 1058 by Netta Cedars D, LPN Outcome: Progressing   Problem: Self-Care: Goal: Verbalization of feelings and concerns over difficulty with self-care  will improve 01/19/2021 1058 by Netta Cedars D, LPN Outcome: Progressing 01/19/2021 1058 by Netta Cedars D, LPN Outcome: Progressing   Problem: Nutrition: Goal: Dietary intake will improve 01/19/2021 1058 by Mercy Riding, LPN  Outcome: Progressing 01/19/2021 1058 by Netta Cedars D, LPN Outcome: Progressing

## 2021-01-19 NOTE — Progress Notes (Signed)
  Speech Language Pathology Treatment: Dysphagia  Patient Details Name: [name] MRN: [id number] DOB: 01/08/1961 Today's Date: 01/19/2021 Time: 4159-3012 SLP Time Calculation (min) (ACUTE ONLY): 8 min  Assessment / Plan / Recommendation Clinical Impression  Pt able to verbalize and demonstrate appropriate posture for Po intake. No signs of dysphagia. Pt tolerating diet well. Mentation much improved. Will sign off for SLP interventions. Pt to continue regular diet and thin liquids.   HPI HPI: Pt is a 60 year old male who presented 3/31 to the ED with 4-5 day hx of diarrhea and vomiting. He developed confusion and became belligerent at home. Initial ED evaluation found him to be hypotensive, combative, confused, and he was intubated.  He was noted to have mottling over his trunk and extremities and was  admitted for hypoxemic respiratory failure and septic shock 2/2 E. Coli bacteremia and UTI, and ATN. ETT 3/31-4/5; reintubated 4/5-4/16. 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      SLP Plan  All goals met       Recommendations  Diet recommendations: Regular;Thin liquid                Oral Care Recommendations: Oral care BID Follow up Recommendations: None Plan: All goals met       GO              Herbie Baltimore, MA CCC-SLP  Acute Rehabilitation Services Pager [phone] Office [phone]   Lynann Beaver 01/19/2021, 3:26 PM

## 2021-01-19 NOTE — Progress Notes (Addendum)
Inpatient Rehab Admissions Coordinator:   CIR consult received. Pt. Has been limited by pain and orthostatic hypertension and has not been tolerating OOB. Therapy is recommending SNF, and I agree that that is the most appropriate disposition for this Pt. At this time. I discussed this with pt. And he stated understanding. CIR will sign off. Please contact me with any questions.   Megan Salon, MS, CCC-SLP Rehab Admissions Coordinator  [phone] (celll) [phone] (office)

## 2021-01-20 ENCOUNTER — Telehealth: Payer: Self-pay

## 2021-01-20 LAB — CBC WITH DIFFERENTIAL/PLATELET
Abs Immature Granulocytes: 0.03 10*3/uL (ref 0.00–0.07)
Basophils Absolute: 0 10*3/uL (ref 0.0–0.1)
Basophils Relative: 0 %
Eosinophils Absolute: 0.1 10*3/uL (ref 0.0–0.5)
Eosinophils Relative: 1 %
HCT: 30.1 % — ABNORMAL LOW (ref 39.0–52.0)
Hemoglobin: 9.5 g/dL — ABNORMAL LOW (ref 13.0–17.0)
Immature Granulocytes: 0 %
Lymphocytes Relative: 28 %
Lymphs Abs: 2 10*3/uL (ref 0.7–4.0)
MCH: 30.9 pg (ref 26.0–34.0)
MCHC: 31.6 g/dL (ref 30.0–36.0)
MCV: 98 fL (ref 80.0–100.0)
Monocytes Absolute: 0.4 10*3/uL (ref 0.1–1.0)
Monocytes Relative: 6 %
Neutro Abs: 4.5 10*3/uL (ref 1.7–7.7)
Neutrophils Relative %: 65 %
Platelets: 273 10*3/uL (ref 150–400)
RBC: 3.07 MIL/uL — ABNORMAL LOW (ref 4.22–5.81)
RDW: 15.3 % (ref 11.5–15.5)
WBC: 7 10*3/uL (ref 4.0–10.5)
nRBC: 0 % (ref 0.0–0.2)

## 2021-01-20 LAB — COMPREHENSIVE METABOLIC PANEL
ALT: 12 U/L (ref 0–44)
AST: 17 U/L (ref 15–41)
Albumin: 2 g/dL — ABNORMAL LOW (ref 3.5–5.0)
Alkaline Phosphatase: 77 U/L (ref 38–126)
Anion gap: 8 (ref 5–15)
BUN: 12 mg/dL (ref 6–20)
CO2: 30 mmol/L (ref 22–32)
Calcium: 8.5 mg/dL — ABNORMAL LOW (ref 8.9–10.3)
Chloride: 95 mmol/L — ABNORMAL LOW (ref 98–111)
Creatinine, Ser: 0.6 mg/dL — ABNORMAL LOW (ref 0.61–1.24)
GFR, Estimated: >60 mL/min (ref >60)
Glucose, Bld: 154 mg/dL — ABNORMAL HIGH (ref 70–99)
Potassium: 3.9 mmol/L (ref 3.5–5.1)
Sodium: 133 mmol/L — ABNORMAL LOW (ref 135–145)
Total Bilirubin: 0.3 mg/dL (ref 0.3–1.2)
Total Protein: 6.5 g/dL (ref 6.5–8.1)

## 2021-01-20 LAB — URINALYSIS, ROUTINE W REFLEX MICROSCOPIC
Bilirubin Urine: NEGATIVE
Glucose, UA: NEGATIVE mg/dL
Ketones, ur: NEGATIVE mg/dL
Nitrite: NEGATIVE
Protein, ur: NEGATIVE mg/dL
Specific Gravity, Urine: 1.005 (ref 1.005–1.030)
pH: 6 (ref 5.0–8.0)

## 2021-01-20 LAB — SAR COV2 SEROLOGY (COVID19)AB(IGG),IA: SARS-CoV-2 Ab, IgG: REACTIVE — AB

## 2021-01-20 LAB — GLUCOSE, CAPILLARY
Glucose-Capillary: 99 mg/dL (ref 70–99)
Glucose-Capillary: 179 mg/dL — ABNORMAL HIGH (ref 70–99)
Glucose-Capillary: 189 mg/dL — ABNORMAL HIGH (ref 70–99)
Glucose-Capillary: 156 mg/dL — ABNORMAL HIGH (ref 70–99)

## 2021-01-20 MED ORDER — SODIUM CHLORIDE 0.9 % IV SOLN: INTRAVENOUS | Status: DC

## 2021-01-20 MED ORDER — METHOCARBAMOL 500 MG PO TABS
500.0000 mg | ORAL_TABLET | Freq: Three times a day (TID) | ORAL | Status: DC
  Administered 2021-01-20 – 2021-01-21 (×6): 500 mg via ORAL
  Filled 2021-01-20 (×6): qty 1

## 2021-01-20 NOTE — Progress Notes (Addendum)
[facility] PROGRESS NOTE  [name] OTR:[id number] DOB: [birth year] DOA: 12/10/2020 PCP: Pcp, No  Status: Remains inpatient appropriate because:Altered mental status, Unsafe d/c plan, IV treatments appropriate due to intensity of illness or inability to take PO and Inpatient level of care appropriate due to severity of illness   Dispo: The patient is from: Home              Anticipated d/c is to: SNF-as of 5/5 no bed offers              Patient currently is not medically stable to d/c.   Difficult to place patient Yes                Level of care: Med-Surg  Code Status: Full Family Communication: Patient, wife and daughter on 5/4. DVT prophylaxis: Eliquis Vaccination status: Unvaccinated-CM spoke with wife who states that for political reasons patient has refused COVID vaccination.  5/10 discussed with patient about COVID-vaccine and he is willing to think about receiving the vaccine.   HPI: 60 year old male with past medical history significant for essential hypertension, hyperlipidemia who presented to [facility] ED on 3/31 for a 5-day history of nausea/vomiting/diarrhea and confusion.  Patient was initially admitted to [facility] service for acute hypoxic respiratory failure requiring intubation and ventilatory support with septic shock secondary to E. coli septicemia, UTI, MSSA Pneumonia, MRSE bacteremia (CLABSI involving left IJ vein and subclavian vein), with possible endovascular infection.  Infectious disease followed during hospital course.  Patient transferred from [facility] to [facility] on 01/01/2021 for further management of ongoing metabolic encephalopathy, tachycardia, dysphagia requiring tube feeds, intermittent fevers and prolonged antibiotic course.  Procedures/significant Hospital events:   Intubation 3/31  Triple-lumen central line placement 3/31  4/1 off pressors. E coli bacteremia, e coli uti. Some improvement in pulm opacities on CXR. On rocephin. TTE with LVEF  40-45%, LV global hypokinesis, G2DD, normal RV, dilated aortic root at 62m.  Bronchoscopy with BAL 4/5, Dr. STamala Julian 4/5 extubated, failed immediately due to mucous plugging identified on bronch after re-intubated. CT ABD with 410mleft ureteral stone with mild edema of left kideny, large areas of consolidation in bilateral lung bases.   4/6 urology consulted for distal left ureteral stone, to OR overnight for stent placement, UCx sent. Versed added for agitation (on fent / precedex)  4/7 Off vasopressors  4/11 Vasopressors restarted   4/13: Left IJ holiday  4/15: Right IJ placed   4/16: extubated, remained agitated and restless requiring IV Precedex infusion  4/18: Cortrak tube insertion and removed 5/4  Antimicrobials:   Daptomycin 4/26>> (End 6/5)  Cefazolin 4/4 - 4/6, 4/28>> (End 5/4)  Metronidazole 4/27>> (End 5/4)  Cefepime 4/6 - 4/7  Ceftriaxone 3/31 - 3/31, 4/7 - 4/11, 4/14 - 4/15  Vancomycin 3/31 - 3/31, 4/14 - 4/24, 4/26 - 4/28  Zosyn 3/31 - 3/31    Subjective: Alert.  Having back pain which worsens with activity and working with PT.  Continue to have discussions regarding COVID-vaccine.  Made patient aware that his COVID antibodies were positive.  Objective: Vitals:   01/19/21 2033 01/20/21 0504  BP: (!) 106/56 121/72  Pulse: 92 84  Resp: 18 20  Temp: 98.2 F (36.8 C) 97.8 F (36.6 C)  SpO2: 96% 97%    Intake/Output Summary (Last 24 hours) at 01/20/2021 0753 Last data filed at 01/20/2021 0305 Gross per 24 hour  Intake --  Output 1450 ml  Net -1450 ml   [employer]  01/03/21 0500 01/09/21 0500 01/19/21 0700  Weight: 120.6 kg 132 kg 118.7 kg    Exam:  Constitutional: Alert, calm but complaining of back pain Respiratory: Lungs are clear, stable on room air Cardiovascular: Normal heart sounds, normotensive, no tachycardia, skin pale and dry Abdomen:  LBM 5/5, soft nontender nondistended with normoactive bowel sounds. Genitourinary: Condom  cath in place with dark amber-colored urine Skin: Has stage III decubitus on hip Neurologic: Dense left upper extremity hemiparesis that is also insensate.  Moves all other extremities equally with strength 4/5. Psychiatric: Alert and oriented x3.  Intermittent mild confusion that improves with reorientation  Assessment/Plan: Acute problems: Acute hypoxic respiratory failure: Resolved MSSA pneumonia --Presented with hypoxia s/p mechanical ventilation due to sepsis physiology  MRSE endovascular infection/septicemia E. coli UTI/septicemia Septic shock, POA --Infectious disease following, appreciate assistance --Continue daptomycin 900 mg IV daily x 6 weeks from 4/24 (~02/14/21) *5/4 discussed with ID Dr. Montel Culver who recommended continuing IV antibiotics for 3 to 4 weeks before discussing potential transition to other oral treatment 5/2 follow-up blood cultures negative  Dark urine -- Sodium 133 with normal LFTs so suspect dark urine secondary to dehydration. --Follow labs -- Begin normal saline at 100 cc/h -- UA: Large hemoglobin, trace leukocytes, rare bacteria-likely baseline given chronic double-J stent.  After adequately hydrated can repeat UA  Back pain w/ activity -Cont sch oxycodone -add scheduled Robaxin-start low-dose 500 mg and uptitrate as indicated  Acute toxic metabolic encephalopathy/ongoing delirium/subacute vs chronic infarct with dense insensate LUE hemiparesis -Acute encephalopathy resolved --continue asa and statin -- Continue PT; KREG Bed   Stage III right buttock decubitus -Initially started as a deep tissue injury and has progressed to decubitus --Flagyl x10 days with last dose 5/6 --Wound care RN following -continue Santyl  Type 2 diabetes mellitus --Home med: Glipizide --Hemoglobin A1c 6.8. --Lantus 35 u BID   Mild constipation --No bowel movement since 5/5 --One-time dose of milk of magnesia, continue dosing of MiraLAX and Colace  Left  IJ/subclavian DVT versus septic thrombus -Left IJ central line subsequently discontinued and RUE PICC line removed 4/24   --Repeat ultrasound duplex scan 4/25 with resolution of DVT. --Continue Lovenox therapeutic dose  Essential hypertension --Continue carvedilol   Recurrent pyelonephritis with obstructive hydronephrosis --s/p left ureteral stent 4/6.   --Repeat ultrasound with mild left hydronephrosis.  CT abdomen with stable position of double-J stent.  Dysphagia/mild protein calorie malnutrition/morbid obesity -- Continue regular diet --Estimated body mass index is 33.6 kg/m as calculated from the following:   Height as of this encounter: _0  (1.88 m).   Weight as of this encounter: 118.7 kg.  Weakness/deconditioning/debility: --Continue PT/OT  -KREG tilt bed unavailable at this time.  Once available will be provided to patient to assist in therapy -May need vaccine if required by rehabilitation facility he states he had COVID in [place] 5/10 positive  Goals of care: --Palliative care evaluated during hospitalization    Other problems: NASH cirrhosis:  Outpatient follow-up with gastroenterology  Mild hyponatremia --Resolved  Hypokalemia --Resolved  Acute renal failure -- Resolved  HLD:  -- Continue atorvastatin 40 mg daily  Atypical chest pain --Suspect GI etiology -cont. Protonix initiated and encourage upright after meals for at least 30 minutes      Data Reviewed: Basic Metabolic Panel: No results for input(s): NA, K, CL, CO2, GLUCOSE, BUN, CREATININE, CALCIUM, MG, PHOS in the last 168 hours. CBC: Recent Labs  Lab 01/17/21 0222  WBC 7.8  HGB 9.2*  HCT 29.2*  MCV 98.3  PLT 267   Cardiac Enzymes: Recent Labs  Lab 01/19/21 1027  CKTOTAL 41*    CBG: Recent Labs  Lab 01/19/21 0308 01/19/21 0909 01/19/21 1154 01/19/21 1608 01/19/21 2035  GLUCAP 83 107* 140* 141* 147*     Studies: No results found.  Scheduled  Meds: . apixaban  5 mg Oral BID  . aspirin  81 mg Oral Daily  . atorvastatin  40 mg Oral Daily  . carvedilol  3.125 mg Oral BID WC  . chlorhexidine  15 mL Mouth Rinse BID  . Chlorhexidine Gluconate Cloth  6 each Topical Daily  . collagenase   Topical Daily  . diclofenac Sodium  2 g Topical QID  . docusate  50 mg Oral Daily  . DULoxetine  30 mg Oral Daily  . feeding supplement  237 mL Oral TID BM  . insulin aspart  0-5 Units Subcutaneous QHS  . insulin aspart  0-9 Units Subcutaneous TID WC  . insulin detemir  35 Units Subcutaneous BID  . mouth rinse  15 mL Mouth Rinse q12n4p  . methocarbamol  500 mg Oral QID  . multivitamin with minerals  1 tablet Oral Daily  . oxyCODONE  10 mg Oral Q6H  . pantoprazole  40 mg Oral BID AC  . polyethylene glycol  17 g Oral Daily  . QUEtiapine  50 mg Oral QHS  . sodium chloride flush  10-40 mL Intracatheter Q12H  . vitamin B-12  100 mcg Oral Daily  . zolpidem  10 mg Oral QHS   Continuous Infusions: . sodium chloride 50 mL (01/19/21 2154)  . DAPTOmycin (CUBICIN)  IV 900 mg (01/19/21 2246)    Active Problems:   Acute respiratory failure (HCC)   AKI (acute kidney injury) ([place])   Bacteremia due to Escherichia coli   Ureteral stone   Pyelonephritis   Cerebral thrombosis with cerebral infarction   Staphylococcus epidermidis bacteremia   Pressure injury of skin   Dysphagia, pharyngoesophageal phase   Physical deconditioning   Consultants:  PCCM  Urology Dr. Claudia Desanctis, Dr. Matilde Sprang  Nephrology, Dr. Jonnie Finner, Dr. Augustin Coupe  Neurology, Dr. Leonie Man  Infectious disease    Procedures:  See above    Time spent: 20 minutes    Erin Hearing ANP  [facility] 7 am - 330 pm/M-F for direct patient care and secure chat Please refer to [place] for contact info 41  days

## 2021-01-20 NOTE — Evaluation (Signed)
Occupational Therapy Evaluation Patient Details Name: [name] MRN: [id number] DOB: [birth year] Today's Date: 01/20/2021    History of Present Illness 60 year old white male who presented to the emergency room from home 12/10/20.  Pt had 4-5 days of N/V/D. Pt became more confused and at times belligerent. Admitted for hypoxemic respiratory failure and septic shock 2/2 E. Coli bacteremia and UTI and was intubated. Extubated 4/16, now on cortrak. dense LUE paresis; CT head "late subacute to chronic infarct within the  posterior right frontal lobe"   Clinical Impression   Pt admitted with above. He demonstrates the below listed deficits and will benefit from continued OT to maximize safety and independence with BADLs.  Pt currently demonstrates Lt hemiparesis, impaired balance, back pain, and decreased activity tolerance.  He requires set up assist - total A for ADLs.   He did tilt today on the tilt bed, and tolerated tilt to 35* (increased slowly and incrementally).  BP initially while supine: 101/64, HR 95; with a drop noted with each incremental tilt.   At 35* BP 88/62 with HR 102 and pt mildly symptomatic.  Pt maintained tilt for ~4 mins with BP stabilizing, but back pain increased and pt could no longer tolerate the tilt so was returned to supine.  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  Recommend SNF level rehab at discharge.       Follow Up Recommendations  SNF    Equipment Recommendations  None recommended by OT    Recommendations for Other Services       Precautions / Restrictions Precautions Precautions: Fall;Other (comment)      Mobility Bed Mobility   Bed Mobility: Rolling Rolling: Supervision         General bed mobility comments: Pt rolls to Lt With min guard assist and min cues    Transfers                 General transfer comment: Pt unable to tolerate due to orthostasis and back pain    Balance                                            ADL either performed or assessed with clinical judgement   ADL Overall ADL's : Needs assistance/impaired Eating/Feeding: Set up;Bed level   Grooming: Wash/dry hands;Wash/dry face;Oral care;Set up;Bed level   Upper Body Bathing: Moderate assistance;Bed level   Lower Body Bathing: Maximal assistance;Bed level   Upper Body Dressing : Maximal assistance;Bed level   Lower Body Dressing: Total assistance;Bed level   Toilet Transfer: Total assistance Toilet Transfer Details (indicate cue type and reason): unable Toileting- Clothing Manipulation and Hygiene: Total assistance;Bed level       Functional mobility during ADLs: Total assistance (Pt unable to tolerate due to orthostasis)       Vision Baseline Vision/History: Wears glasses Wears Glasses: At all times Patient Visual Report: No change from baseline Additional Comments: Pt reports he feels his vision is at baseline, but will benefit from further visual assessment     Perception Perception Perception Tested?: Yes   Praxis Praxis Praxis tested?: Within functional limits    Pertinent Vitals/Pain Pain Assessment: Faces Faces Pain Scale: Hurts even more Pain Location: back Pain Descriptors / Indicators: Restless;Grimacing Pain Intervention(s): Monitored during session;Premedicated before session;Repositioned;Limited activity within patient's tolerance     Hand Dominance Right  Extremity/Trunk Assessment Upper Extremity Assessment Upper Extremity Assessment: LUE deficits/detail LUE Deficits / Details: Pt reports he "can't" move Lt UE.  However, trace movement noted shoulder and elbow on command.  He was able to assist with AAROM LUE Sensation: decreased light touch   Lower Extremity Assessment Lower Extremity Assessment: Defer to PT evaluation   Cervical / Trunk Assessment Cervical / Trunk Exceptions: obese   Communication Communication Communication: No  difficulties   Cognition Arousal/Alertness: Awake/alert Behavior During Therapy: WFL for tasks assessed/performed Overall Cognitive Status: Within Functional Limits for tasks assessed                                 General Comments: Grossly WFL.  He was very animated and joking.  He recalled the Kreg bed rep from yesterday and that we had a "date" to tilt upon OT arrival.  He was also able to recall the discussion about the need to premedicate him prior to tilting and was able to direct OT as to his needs   General Comments  [employer] bed representative, Janyth Pupa, present during session    Exercises Other Exercises Other Exercises: BP supine 106/61; HR 95.  Pt slowly and incrementally tilted to 35* with BP dropping to 86/62, HR 102.  He reports feeling mildly diaphoretic.  Held him at this angle of tilt > 5 mins with hopes that BP would stabilize - BP 86/62, but back pain increased with pt requesting to return to supine at that time   Shoulder Instructions      Home Living Family/patient expects to be discharged to:: Unsure                                 Additional Comments: Pt lived with wife at home per chart.  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        OT Problem List: Decreased strength;Impaired balance (sitting and/or standing);Decreased activity tolerance;Decreased coordination;Decreased cognition;Decreased safety awareness;Impaired sensation;Impaired tone;Obesity;Impaired UE functional use;Pain      OT Treatment/Interventions: Self-care/ADL training;Neuromuscular education;DME and/or AE instruction;Therapeutic activities;Cognitive remediation/compensation;Patient/family education;Balance training    OT Goals(Current goals can be found in the care plan section) Acute Rehab OT Goals Patient Stated  Goal: to tolerate being upright without BP dropping and with less pain OT Goal Formulation: With patient/family Time For Goal Achievement: 02/03/21 Potential to Achieve Goals: Good ADL Goals Pt Will Perform Grooming: with set-up;sitting Pt Will Perform Upper Body Bathing: with min assist;sitting Pt Will Perform Lower Body Bathing: with mod assist;sit to/from stand Pt Will Transfer to Toilet: with mod assist;stand pivot transfer;bedside commode Pt Will Perform Toileting - Clothing Manipulation and hygiene: with max assist;sit to/from stand Additional ADL Goal #1: Pt will use Lt UE as a gross assist with min facilitation 25% of the time  OT Frequency: Min 2X/week   Barriers to D/C:            Co-evaluation              AM-PAC OT "6 Clicks" Daily Activity     Outcome Measure Help from another person eating meals?: A Little Help from another person taking care of personal grooming?: A Little Help from another person toileting, which includes using toliet,  bedpan, or urinal?: Total Help from another person bathing (including washing, rinsing, drying)?: A Lot Help from another person to put on and taking off regular upper body clothing?: A Lot Help from another person to put on and taking off regular lower body clothing?: Total 6 Click Score: 12   End of Session Nurse Communication: Mobility status  Activity Tolerance: Treatment limited secondary to medical complications (Comment) (Orthostasis and pain) Patient left: in bed;with call bell/phone within reach;with family/visitor present  OT Visit Diagnosis: Unsteadiness on feet (R26.81);Cognitive communication deficit (R41.841);Pain;Muscle weakness (generalized) (M62.81);Hemiplegia and hemiparesis Hemiplegia - Right/Left: Left Hemiplegia - dominant/non-dominant: Non-Dominant Hemiplegia - caused by: Cerebral infarction                Time: 5176-1607 OT Time Calculation (min): 43 min Charges:  OT General Charges $OT Visit: 1  Visit OT Evaluation $OT Eval Moderate Complexity: 1 Mod OT Treatments $Therapeutic Activity: 8-22 mins $Neuromuscular Re-education: 8-22 mins  Eber Jones., OTR/L Acute Rehabilitation Services Pager [phone] Office [phone]   Jeani Hawking M 01/20/2021, 2:34 PM

## 2021-01-20 NOTE — Progress Notes (Signed)
    [facility]   Date of Admission:  12/10/2020     Reason for visit: Follow up on MRSE bacteremia and septic thrombophlebitis  Interval History: Patient was scheduled for a hospital follow-up with Dr. Algis Liming on Friday, 01/22/2021.  However, he remains hospitalized and as a result his wife called our clinic and canceled his appointment for Friday.  She requested one of our providers see him in the hospital.  He was last seen by ID on 01/11/2021 by Dr. Elinor Parkinson.  At that time she recommended daptomycin per pharmacy protocol for 6 weeks with an end date of 02/14/2021.  He has previously completed treatment for recurrent E. coli bacteremia in the setting of pyelonephritis complicated by hydronephrosis status post stent placement on 12/16/2020.  Today, he reports that he is overall doing well without any specific complaints other than his back pain that seems to worsen with activity and while working with PT.  Physical therapy and Occupational Therapy have recommended skilled nursing home placement.  Physical Exam: Blood pressure 135/75, pulse 99, temperature 97.7 F (36.5 C), temperature source Oral, resp. rate 20, height 6\' 2"  (1.88 m), weight 118.7 kg, SpO2 93 %.  General: patient appears in NAD  Assessment:  MRSE bacteremia complicated by septic thrombophlebitis  Recommendations: -- No changes today.  Continue previous plan as outlined by Dr. on 01/11/21.  Please call back as needed when patient approaches discharge so his appointment with our clinic can be rescheduled.  Please call back as needed with any other questions.   03/13/21 for Infectious Disease [facility] [phone] pager 01/20/2021, 4:16 PM

## 2021-01-20 NOTE — Plan of Care (Signed)
  Problem: Education: Goal: Knowledge of General Education information will improve Description: Including pain rating scale, medication(s)/side effects and non-pharmacologic comfort measures Outcome: Progressing   Problem: Health Behavior/Discharge Planning: Goal: Ability to manage health-related needs will improve Outcome: Progressing   Problem: Clinical Measurements: Goal: Ability to maintain clinical measurements within normal limits will improve Outcome: Progressing Goal: Will remain free from infection Outcome: Progressing Goal: Diagnostic test results will improve Outcome: Progressing Goal: Respiratory complications will improve Outcome: Progressing Goal: Cardiovascular complication will be avoided Outcome: Progressing   Problem: Activity: Goal: Risk for activity intolerance will decrease Outcome: Progressing   Problem: Nutrition: Goal: Adequate nutrition will be maintained Outcome: Progressing   Problem: Coping: Goal: Level of anxiety will decrease Outcome: Progressing   Problem: Elimination: Goal: Will not experience complications related to bowel motility Outcome: Progressing Goal: Will not experience complications related to urinary retention Outcome: Progressing   Problem: Pain Managment: Goal: General experience of comfort will improve Outcome: Progressing   Problem: Safety: Goal: Ability to remain free from injury will improve Outcome: Progressing   Problem: Skin Integrity: Goal: Risk for impaired skin integrity will decrease Outcome: Progressing   Problem: Safety: Goal: Non-violent Restraint(s) Outcome: Progressing   Problem: Education: Goal: Knowledge of disease or condition will improve Outcome: Progressing Goal: Knowledge of secondary prevention will improve Outcome: Progressing Goal: Knowledge of patient specific risk factors addressed and post discharge goals established will improve Outcome: Progressing   Problem: Self-Care: Goal:  Verbalization of feelings and concerns over difficulty with self-care will improve Outcome: Progressing   Problem: Nutrition: Goal: Dietary intake will improve Outcome: Progressing   

## 2021-01-20 NOTE — Plan of Care (Signed)
Problem: Education: Goal: Knowledge of General Education information will improve Description: Including pain rating scale, medication(s)/side effects and non-pharmacologic comfort measures 01/20/2021 1115 by Netta Cedars D, LPN Outcome: Progressing 01/20/2021 1012 by Netta Cedars D, LPN Outcome: Progressing   Problem: Health Behavior/Discharge Planning: Goal: Ability to manage health-related needs will improve 01/20/2021 1115 by Netta Cedars D, LPN Outcome: Progressing 01/20/2021 1012 by Netta Cedars D, LPN Outcome: Progressing   Problem: Clinical Measurements: Goal: Ability to maintain clinical measurements within normal limits will improve 01/20/2021 1115 by Netta Cedars D, LPN Outcome: Progressing 01/20/2021 1012 by Netta Cedars D, LPN Outcome: Progressing Goal: Will remain free from infection 01/20/2021 1115 by Netta Cedars D, LPN Outcome: Progressing 01/20/2021 1012 by Netta Cedars D, LPN Outcome: Progressing Goal: Diagnostic test results will improve 01/20/2021 1115 by Netta Cedars D, LPN Outcome: Progressing 01/20/2021 1012 by Netta Cedars D, LPN Outcome: Progressing Goal: Respiratory complications will improve 01/20/2021 1115 by Netta Cedars D, LPN Outcome: Progressing 01/20/2021 1012 by Netta Cedars D, LPN Outcome: Progressing Goal: Cardiovascular complication will be avoided 01/20/2021 1115 by Netta Cedars D, LPN Outcome: Progressing 01/20/2021 1012 by Netta Cedars D, LPN Outcome: Progressing   Problem: Activity: Goal: Risk for activity intolerance will decrease 01/20/2021 1115 by Netta Cedars D, LPN Outcome: Progressing 01/20/2021 1012 by Netta Cedars D, LPN Outcome: Progressing   Problem: Nutrition: Goal: Adequate nutrition will be maintained 01/20/2021 1115 by Netta Cedars D, LPN Outcome: Progressing 01/20/2021 1012 by Netta Cedars D, LPN Outcome: Progressing   Problem: Coping: Goal: Level of anxiety will decrease 01/20/2021 1115 by Netta Cedars D, LPN Outcome:  Progressing 01/20/2021 1012 by Netta Cedars D, LPN Outcome: Progressing   Problem: Elimination: Goal: Will not experience complications related to bowel motility 01/20/2021 1115 by Netta Cedars D, LPN Outcome: Progressing 01/20/2021 1012 by Netta Cedars D, LPN Outcome: Progressing Goal: Will not experience complications related to urinary retention 01/20/2021 1115 by Netta Cedars D, LPN Outcome: Progressing 01/20/2021 1012 by Netta Cedars D, LPN Outcome: Progressing   Problem: Pain Managment: Goal: General experience of comfort will improve 01/20/2021 1115 by Netta Cedars D, LPN Outcome: Progressing 01/20/2021 1012 by Netta Cedars D, LPN Outcome: Progressing   Problem: Safety: Goal: Ability to remain free from injury will improve 01/20/2021 1115 by Netta Cedars D, LPN Outcome: Progressing 01/20/2021 1012 by Netta Cedars D, LPN Outcome: Progressing   Problem: Skin Integrity: Goal: Risk for impaired skin integrity will decrease 01/20/2021 1115 by Netta Cedars D, LPN Outcome: Progressing 01/20/2021 1012 by Netta Cedars D, LPN Outcome: Progressing   Problem: Safety: Goal: Non-violent Restraint(s) 01/20/2021 1115 by Netta Cedars D, LPN Outcome: Progressing 01/20/2021 1012 by Netta Cedars D, LPN Outcome: Progressing   Problem: Education: Goal: Knowledge of disease or condition will improve 01/20/2021 1115 by Netta Cedars D, LPN Outcome: Progressing 01/20/2021 1012 by Netta Cedars D, LPN Outcome: Progressing Goal: Knowledge of secondary prevention will improve 01/20/2021 1115 by Netta Cedars D, LPN Outcome: Progressing 01/20/2021 1012 by Netta Cedars D, LPN Outcome: Progressing Goal: Knowledge of patient specific risk factors addressed and post discharge goals established will improve 01/20/2021 1115 by Netta Cedars D, LPN Outcome: Progressing 01/20/2021 1012 by Netta Cedars D, LPN Outcome: Progressing   Problem: Self-Care: Goal: Verbalization of feelings and concerns over difficulty with self-care  will improve 01/20/2021 1115 by Netta Cedars D, LPN Outcome: Progressing 01/20/2021 1012 by Netta Cedars D, LPN Outcome: Progressing   Problem: Nutrition: Goal: Dietary intake will improve 01/20/2021 1115 by Mercy Riding, LPN  Outcome: Progressing 01/20/2021 1012 by Netta Cedars D, LPN Outcome: Progressing

## 2021-01-20 NOTE — Progress Notes (Signed)
Physical Therapy Wound Treatment Patient Details  Name: [name] MRN: [id number] Date of Birth: 03/02/1961  Today's Date: 01/20/2021 Time: 1216-1248 Time Calculation (min): 32 min  Subjective  Subjective Assessment Subjective: Pleasant and agreeable Patient and Family Stated Goals: None stated throughout session. Date of Onset:  (Unknown) Prior Treatments:  (Dressing changes)  Pain Score:  Pt tolerated treatment well without premedication  Wound Assessment  Pressure Injury 12/21/20 Buttocks Right Deep Tissue Pressure Injury - Purple or maroon localized area of discolored intact skin or blood-filled blister due to damage of underlying soft tissue from pressure and/or shear. purple with blister area that has  (Active)  Dressing Type ABD;Barrier Film (skin prep);Gauze (Comment);Moist to moist;Santyl 01/20/21 1311  Dressing Changed;Clean;Dry;Intact 01/20/21 1311  Dressing Change Frequency Daily 01/20/21 1311  State of Healing Early/partial granulation 01/20/21 1311  Site / Wound Assessment Pink;Red;Yellow 01/20/21 1311  % Wound base Red or Granulating 70% 01/20/21 1311  % Wound base Yellow/Fibrinous Exudate 30% 01/20/21 1311  % Wound base Black/Eschar 0% 01/20/21 1311  % Wound base Other/Granulation Tissue (Comment) 0% 01/20/21 1311  Peri-wound Assessment Intact 01/20/21 1311  Wound Length (cm) 10 cm 01/15/21 1100  Wound Width (cm) 8 cm 01/15/21 1100  Wound Depth (cm) 6 cm 01/15/21 1100  Wound Surface Area (cm^2) 80 cm^2 01/15/21 1100  Wound Volume (cm^3) 480 cm^3 01/15/21 1100  Tunneling (cm) 0 01/15/21 1441  Undermining (cm) 0 01/15/21 1441  Margins Unattached edges (unapproximated) 01/20/21 1311  Drainage Amount Minimal 01/20/21 1311  Drainage Description Serosanguineous 01/20/21 1311  Treatment Debridement (Selective);Hydrotherapy (Pulse lavage);Packing (Saline gauze) 01/20/21 1311   Hydrotherapy Pulsed lavage therapy - wound location: R buttock Pulsed Lavage with  Suction (psi): 12 psi Pulsed Lavage with Suction - Normal Saline Used: 1000 mL Pulsed Lavage Tip: Tip with splash shield Selective Debridement Selective Debridement - Location: R buttock Selective Debridement - Tools Used: Forceps,Scalpel Selective Debridement - Tissue Removed: unviable tissue    Wound Assessment and Plan  Wound Therapy - Assess/Plan/Recommendations Wound Therapy - Clinical Statement: Progressing well with debridement and overall wound appearance is improved. Wife present and asked to take a picture. Wife took a picture with measurement present in the photo for accuracy. Continue with curent hydrotherapy POC with the hope of transitioning to dressing changes by RN by the end of the week. Wound Therapy - Functional Problem List: Global weakness in the setting of critical illness and extended ICU stay. Factors Delaying/Impairing Wound Healing: Immobility,Multiple medical problems,Polypharmacy,Infection - systemic/local Hydrotherapy Plan: Dressing change,Patient/family education,Pulsatile lavage with suction,Debridement Wound Therapy - Frequency: 3X / week (hydrotherapy MWF with nursing to continue with daily dressing changes in between.) Wound Therapy - Follow Up Recommendations: dressing changes by RN  Wound Therapy Goals- Improve the function of patient's integumentary system by progressing the wound(s) through the phases of wound healing (inflammation - proliferation - remodeling) by: Wound Therapy Goals - Improve the function of patient's integumentary system by progressing the wound(s) through the phases of wound healing by: Decrease Necrotic Tissue to: 25% Decrease Necrotic Tissue - Progress: Progressing toward goal Increase Granulation Tissue to: 75% Increase Granulation Tissue - Progress: Progressing toward goal Improve Drainage Characteristics: Min,Serous Improve Drainage Characteristics - Progress: Progressing toward goal Goals/treatment plan/discharge plan were  made with and agreed upon by patient/family: No, Patient unable to participate in goals/treatment/discharge plan and family unavailable Time For Goal Achievement: 7 days Wound Therapy - Potential for Goals: Good  Goals will be updated until maximal potential achieved or discharge criteria met.    Discharge criteria: when goals achieved, discharge from hospital, MD decision/surgical intervention, no progress towards goals, refusal/missing three consecutive treatments without notification or medical reason.  GP     Charges PT Wound Care Charges $Wound Debridement up to 20 cm: < or equal to 20 cm $ Wound Debridement each add'l 20 sqcm: 1 $PT PLS Gun and Tip: 1 Supply $PT Hydrotherapy Visit: 1 Visit       Thelma Comp 01/20/2021, 1:34 PM   Rolinda Roan, PT, DPT Acute Rehabilitation Services Pager: [phone] Office: [phone]

## 2021-01-20 NOTE — Telephone Encounter (Signed)
Patient's wife called office today regarding appointment for Friday. States patient is currently admitted. Would like to know if provider can see him at the hospital.  Canceled appointment for Friday. Requested wife to call office to reschedule once discharged. Juanita Laster, RMA

## 2021-01-21 LAB — CBC
HCT: 30.4 % — ABNORMAL LOW (ref 39.0–52.0)
Hemoglobin: 9.4 g/dL — ABNORMAL LOW (ref 13.0–17.0)
MCH: 30.7 pg (ref 26.0–34.0)
MCHC: 30.9 g/dL (ref 30.0–36.0)
MCV: 99.3 fL (ref 80.0–100.0)
Platelets: 264 10*3/uL (ref 150–400)
RBC: 3.06 MIL/uL — ABNORMAL LOW (ref 4.22–5.81)
RDW: 15.2 % (ref 11.5–15.5)
WBC: 6.6 10*3/uL (ref 4.0–10.5)
nRBC: 0 % (ref 0.0–0.2)

## 2021-01-21 LAB — BASIC METABOLIC PANEL
Anion gap: 7 (ref 5–15)
BUN: 11 mg/dL (ref 6–20)
CO2: 32 mmol/L (ref 22–32)
Calcium: 8.8 mg/dL — ABNORMAL LOW (ref 8.9–10.3)
Chloride: 99 mmol/L (ref 98–111)
Creatinine, Ser: 0.56 mg/dL — ABNORMAL LOW (ref 0.61–1.24)
GFR, Estimated: >60 mL/min (ref >60)
Glucose, Bld: 104 mg/dL — ABNORMAL HIGH (ref 70–99)
Potassium: 3.9 mmol/L (ref 3.5–5.1)
Sodium: 138 mmol/L (ref 135–145)

## 2021-01-21 LAB — GLUCOSE, CAPILLARY
Glucose-Capillary: 100 mg/dL — ABNORMAL HIGH (ref 70–99)
Glucose-Capillary: 152 mg/dL — ABNORMAL HIGH (ref 70–99)
Glucose-Capillary: 150 mg/dL — ABNORMAL HIGH (ref 70–99)
Glucose-Capillary: 194 mg/dL — ABNORMAL HIGH (ref 70–99)

## 2021-01-21 MED ORDER — OXYCODONE HCL ER 10 MG PO T12A
10.0000 mg | EXTENDED_RELEASE_TABLET | Freq: Two times a day (BID) | ORAL | Status: DC
Start: 2021-01-21 — End: 2021-01-27
  Administered 2021-01-21 – 2021-01-27 (×13): 10 mg via ORAL
  Filled 2021-01-21 (×13): qty 1

## 2021-01-21 MED ORDER — LIDOCAINE 5 % EX PTCH
1.0000 | MEDICATED_PATCH | CUTANEOUS | Status: DC
  Administered 2021-01-21 – 2021-02-02 (×13): 1 via TRANSDERMAL
  Filled 2021-01-21 (×12): qty 1

## 2021-01-21 MED ORDER — OXYCODONE HCL 5 MG PO TABS
5.0000 mg | ORAL_TABLET | ORAL | Status: DC | PRN
Start: 2021-01-21 — End: 2021-01-23
  Administered 2021-01-22 – 2021-01-23 (×3): 5 mg via ORAL
  Filled 2021-01-21 (×3): qty 1

## 2021-01-21 MED ORDER — METHOCARBAMOL 500 MG PO TABS
750.0000 mg | ORAL_TABLET | Freq: Three times a day (TID) | ORAL | Status: DC
  Administered 2021-01-21 – 2021-01-28 (×27): 750 mg via ORAL
  Filled 2021-01-21 (×27): qty 2

## 2021-01-21 NOTE — Plan of Care (Signed)
  Problem: Education: Goal: Knowledge of General Education information will improve Description: Including pain rating scale, medication(s)/side effects and non-pharmacologic comfort measures Outcome: Progressing   Problem: Health Behavior/Discharge Planning: Goal: Ability to manage health-related needs will improve Outcome: Progressing   Problem: Clinical Measurements: Goal: Ability to maintain clinical measurements within normal limits will improve Outcome: Progressing Goal: Will remain free from infection Outcome: Progressing Goal: Diagnostic test results will improve Outcome: Progressing Goal: Respiratory complications will improve Outcome: Progressing Goal: Cardiovascular complication will be avoided Outcome: Progressing   Problem: Activity: Goal: Risk for activity intolerance will decrease Outcome: Progressing   Problem: Nutrition: Goal: Adequate nutrition will be maintained Outcome: Progressing   Problem: Coping: Goal: Level of anxiety will decrease Outcome: Progressing   Problem: Elimination: Goal: Will not experience complications related to bowel motility Outcome: Progressing Goal: Will not experience complications related to urinary retention Outcome: Progressing   Problem: Pain Managment: Goal: General experience of comfort will improve Outcome: Progressing   Problem: Safety: Goal: Ability to remain free from injury will improve Outcome: Progressing   Problem: Skin Integrity: Goal: Risk for impaired skin integrity will decrease Outcome: Progressing   Problem: Safety: Goal: Non-violent Restraint(s) Outcome: Progressing   Problem: Education: Goal: Knowledge of disease or condition will improve Outcome: Progressing Goal: Knowledge of secondary prevention will improve Outcome: Progressing Goal: Knowledge of patient specific risk factors addressed and post discharge goals established will improve Outcome: Progressing   Problem: Self-Care: Goal:  Verbalization of feelings and concerns over difficulty with self-care will improve Outcome: Progressing   Problem: Nutrition: Goal: Dietary intake will improve Outcome: Progressing   

## 2021-01-21 NOTE — Progress Notes (Addendum)
[facility] PROGRESS NOTE  Maxamillion Banas YBO:[id number] DOB: December 24, 1960 DOA: 12/10/2020 PCP: Pcp, No  Status: Remains inpatient appropriate because:Altered mental status, Unsafe d/c plan, IV treatments appropriate due to intensity of illness or inability to take PO and Inpatient level of care appropriate due to severity of illness   Dispo: The patient is from: Home              Anticipated d/c is to: SNF-as of 5/5 no bed offers              Patient currently is not medically stable to d/c.   Difficult to place patient Yes              Level of care: Med-Surg  Code Status: Full Family Communication: Patient, wife 5/12 DVT prophylaxis: Eliquis Vaccination status: Unvaccinated-CM spoke with wife who states that for political reasons patient has refused COVID vaccination.  5/10 discussed with patient about COVID-vaccine and he is willing to think about receiving the vaccine.   HPI: 60 year old male with past medical history significant for essential hypertension, hyperlipidemia who [facility] ED on 3/31 for a 5-day history of nausea/vomiting/diarrhea and confusion.  Patient was initially admitted to [facility] service for acute hypoxic respiratory failure requiring intubation and ventilatory support with septic shock secondary to E. coli septicemia, UTI, MSSA Pneumonia, MRSE bacteremia (CLABSI involving left IJ vein and subclavian vein), with possible endovascular infection.  Infectious disease followed during hospital course.  Patient transferred from [facility] to [facility] on 01/01/2021 for further management of ongoing metabolic encephalopathy, tachycardia, dysphagia requiring tube feeds, intermittent fevers and prolonged antibiotic course.  Procedures/significant Hospital events:   Intubation 3/31  Triple-lumen central line placement 3/31  4/1 off pressors. E coli bacteremia, e coli uti. Some improvement in pulm opacities on CXR. On rocephin. TTE with LVEF 40-45%, LV global  hypokinesis, G2DD, normal RV, dilated aortic root at 47m.  Bronchoscopy with BAL 4/5, Dr. STamala Julian 4/5 extubated, failed immediately due to mucous plugging identified on bronch after re-intubated. CT ABD with 418mleft ureteral stone with mild edema of left kideny, large areas of consolidation in bilateral lung bases.   4/6 urology consulted for distal left ureteral stone, to OR overnight for stent placement, UCx sent. Versed added for agitation (on fent / precedex)  4/7 Off vasopressors  4/11 Vasopressors restarted   4/13: Left IJ holiday  4/15: Right IJ placed   4/16: extubated, remained agitated and restless requiring IV Precedex infusion  4/18: Cortrak tube insertion and removed 5/4  Antimicrobials:   Daptomycin 4/26>> (End 6/5)  Cefazolin 4/4 - 4/6, 4/28>> (End 5/4)  Metronidazole 4/27>> (End 5/4)  Cefepime 4/6 - 4/7  Ceftriaxone 3/31 - 3/31, 4/7 - 4/11, 4/14 - 4/15  Vancomycin 3/31 - 3/31, 4/14 - 4/24, 4/26 - 4/28  Zosyn 3/31 - 3/31    Subjective: Alert and pleasant.  States back pain has improved with the addition of Robaxin.  Questions answered regarding discharge disposition  Objective: Vitals:   01/20/21 1356 01/20/21 2005  BP: 135/75 106/72  Pulse: 99 100  Resp:  20  Temp: 97.7 F (36.5 C) 98 F (36.7 C)  SpO2: 93% 96%    Intake/Output Summary (Last 24 hours) at 01/21/2021 0739 Last data filed at 01/21/2021 0500 Gross per 24 hour  Intake 500 ml  Output 800 ml  Net -300 ml   Filed Weights   01/03/21 0500 01/09/21 0500 01/19/21 0700  Weight: 120.6 kg 132 kg 118.7 kg  Exam:  Constitutional: Awake and pleasant.  No acute distress. Respiratory: Lung sounds are clear to auscultation.  Patient supine in bed and no increased work of breathing.  Room air Cardiovascular: Normal heart sounds, no peripheral edema, regular pulse, no tachycardia. Abdomen:  LBM 5/10, soft nontender nondistended.  Normoactive bowel sounds.  Eating well Genitourinary:  Condom cath in place with much clearer urine than yesterday Skin: Has stage III decubitus on hip Neurologic: Dense left upper extremity hemiparesis that is also insensate.  Moves all other extremities equally with strength 4/5. Psychiatric: Alert and oriented x3.  Intermittent episodes of confusion but is easily reoriented.  Assessment/Plan: Acute problems: Acute hypoxic respiratory failure: Resolved MSSA pneumonia --Presented with hypoxia s/p mechanical ventilation due to sepsis physiology  MRSE endovascular infection/septicemia E. coli UTI/septicemia Septic shock, POA --Infectious disease following, appreciate assistance --Continue daptomycin 900 mg IV daily x 6 weeks from 4/24 (~02/14/21) *5/4 discussed with ID Dr. Montel Culver who recommended continuing IV antibiotics for 3 to 4 weeks before discussing potential transition to other oral treatment 5/2 FU blood cxs neg  Acute hyponatremia with dehydration and associated recurrent orthostasis --5/12 sodium has normalized to 138 after IVFs --Patient had episode of orthostasis during PT session on 5/11 --Continue normal saline at 100 cc/h -- UA: Large hemoglobin, trace leukocytes, rare bacteria-likely baseline given chronic double-J stent.  After adequately hydrated can repeat UA  Back pain w/ activity -Today PT reports patient having significant/excruciating pain with activity and this is influencing his ability to participate effectively.  Have changed to scheduled Oxy IR to as needed for breakthrough pain and will add OxyContin 10 mg twice daily and uptitrate if necessary -- Patient reported improvement in back pain with scheduled Robaxin given significant pain will increase dosage to 750 mg 3 times daily -- Add lidocaine patch  Acute toxic metabolic encephalopathy/ongoing delirium/subacute vs chronic infarct with dense insensate LUE hemiparesis -Acute encephalopathy resolved --continue asa and statin -- Continue PT; KREG bed   Stage  III right buttock decubitus -Initially started as a deep tissue injury and has progressed to decubitus --Flagyl x10 days with last dose 5/6 --Wound care RN following -continue Santyl  Type 2 diabetes mellitus --Home med: Glipizide --Hemoglobin A1c 6.8. --Lantus 35 u BID   Mild constipation --One-time dose of milk of magnesia effective; continue MiraLAX and Colace  Left IJ/subclavian DVT versus septic thrombus -Left IJ central line subsequently discontinued and RUE PICC line removed 4/24   --Repeat ultrasound duplex scan 4/25 with resolution of DVT. --Continue Lovenox therapeutic dose  Essential hypertension --Continue carvedilol   Recurrent pyelonephritis with obstructive hydronephrosis --s/p left ureteral stent 4/6.   --Repeat ultrasound with mild left hydronephrosis.  CT abdomen with stable position of double-J stent.  Dysphagia/mild protein calorie malnutrition/morbid obesity -- Continue regular diet --Estimated body mass index is 33.6 kg/m as calculated from the following:   Height as of this encounter: _0  (1.88 m).   Weight as of this encounter: 118.7 kg.  Weakness/deconditioning/debility: --Continue PT/OT  -Continue KREG tilt bed  -May need vaccine if required by rehabilitation facility he states he had COVID in [place] 5/10 positive  Goals of care: --Palliative care evaluated during hospitalization    Other problems: NASH cirrhosis:  Outpatient follow-up with gastroenterology  Mild hyponatremia --Resolved  Hypokalemia --Resolved  Acute renal failure -- Resolved  HLD:  -- Continue atorvastatin 40 mg daily  Atypical chest pain --Suspect GI etiology -cont. Protonix initiated and encourage upright after meals for at least 30  minutes      Data Reviewed: Basic Metabolic Panel: Recent Labs  Lab 01/20/21 1048 01/21/21 0211  NA 133* 138  K 3.9 3.9  CL 95* 99  CO2 30 32  GLUCOSE 154* 104*  BUN 12 11  CREATININE 0.60* 0.56*   CALCIUM 8.5* 8.8*   CBC: Recent Labs  Lab 01/17/21 0222 01/20/21 1048 01/21/21 0211  WBC 7.8 7.0 6.6  NEUTROABS  --  4.5  --   HGB 9.2* 9.5* 9.4*  HCT 29.2* 30.1* 30.4*  MCV 98.3 98.0 99.3  PLT 267 273 264   Cardiac Enzymes: Recent Labs  Lab 01/19/21 1027  CKTOTAL 41*    CBG: Recent Labs  Lab 01/19/21 2035 01/20/21 0817 01/20/21 1127 01/20/21 1621 01/20/21 2011  GLUCAP 147* 99 179* 189* 156*     Studies: No results found.  Scheduled Meds: . apixaban  5 mg Oral BID  . aspirin  81 mg Oral Daily  . atorvastatin  40 mg Oral Daily  . carvedilol  3.125 mg Oral BID WC  . chlorhexidine  15 mL Mouth Rinse BID  . Chlorhexidine Gluconate Cloth  6 each Topical Daily  . collagenase   Topical Daily  . diclofenac Sodium  2 g Topical QID  . docusate  50 mg Oral Daily  . DULoxetine  30 mg Oral Daily  . feeding supplement  237 mL Oral TID BM  . insulin aspart  0-5 Units Subcutaneous QHS  . insulin aspart  0-9 Units Subcutaneous TID WC  . insulin detemir  35 Units Subcutaneous BID  . mouth rinse  15 mL Mouth Rinse q12n4p  . methocarbamol  500 mg Oral TID AC & HS  . multivitamin with minerals  1 tablet Oral Daily  . oxyCODONE  10 mg Oral Q6H  . pantoprazole  40 mg Oral BID AC  . polyethylene glycol  17 g Oral Daily  . QUEtiapine  50 mg Oral QHS  . sodium chloride flush  10-40 mL Intracatheter Q12H  . vitamin B-12  100 mcg Oral Daily  . zolpidem  10 mg Oral QHS   Continuous Infusions: . sodium chloride 50 mL (01/19/21 2154)  . sodium chloride 100 mL/hr at 01/21/21 0637  . DAPTOmycin (CUBICIN)  IV 900 mg (01/20/21 2016)    Active Problems:   Acute respiratory failure (HCC)   AKI (acute kidney injury) ([place])   Bacteremia due to Escherichia coli   Ureteral stone   Pyelonephritis   Cerebral thrombosis with cerebral infarction   Staphylococcus epidermidis bacteremia   Pressure injury of skin   Dysphagia, pharyngoesophageal phase   Physical  deconditioning   Consultants:  PCCM  Urology Dr. Claudia Desanctis, Dr. Matilde Sprang  Nephrology, Dr. Jonnie Finner, Dr. Augustin Coupe  Neurology, Dr. Leonie Man  Infectious disease    Procedures:  See above    Time spent: 20 minutes    Erin Hearing ANP  [facility] 7 am - 330 pm/M-F for direct patient care and secure chat Please refer to Amion for contact info 42  days

## 2021-01-21 NOTE — Progress Notes (Signed)
Nutrition Follow-up  DOCUMENTATION CODES:   Obesity unspecified  INTERVENTION:  -Continue Ensure Enlive po TID, each supplement provides 350 kcal and 20 grams of protein -Continue Magic cup TID with meals, each supplement provides 290 kcal and 9 grams of protein -Continue MVI with minerals daily -Continue room service with assistance  NUTRITION DIAGNOSIS:  Increased nutrient needs related to wound healing as evidenced by estimated needs. - ongoing  GOAL:  Patient will meet greater than or equal to 90% of their needs - progressing  MONITOR:  TF tolerance,Labs,Diet advancement,PO intake  REASON FOR ASSESSMENT:  Ventilator,Consult Enteral/tube feeding initiation and management  ASSESSMENT:  60 yo male admitted with acute metabolic encephalopathy, acute respiratory failure, septic shock, E coli bacteremia, E coli UTI. PMH includes recent severe diarrhea and vomiting, chronic pain, HTN, HLD.  3/31 - intubated 4/5 - extubated then immediately reintubated d/t secretions  4/6 - s/p ureteral stent placement 4/16 - extubated 4/18 - cortrak placed (gastric tip) 4/21 - dysphagia 2 diet with honey thick liquids ordered s/p BSE 4/22 - tx to [facility] 4/24 - blood cultures w/staph epidermidis bacteremia/gram-negative rods bacteremia(ID following), PICC removed; off O2; diet downgraded to dysphagia 1 with honey thick liquids s/p BSE 4/25 - PCCM signed off 5/1 - diet advanced to dysphagia 2 with nectar thick liquids 5/3 - diet advanced to dysphagia 3 with nectar thick liquids  5/4 - cortrak removed 5/5 - diet advanced to regular with thin liquids  Plan for discharge to SNF, currently no bed offers.   Discussed pt with RN who reports pt doing well with meals and supplements. Pt endorses good intake and states he is still enjoying both Ensure and [employer]. Recommend continue with current nutrition plan of care.   PO intake has been 0-100% x 8 recorded meals (50% average intake). Note pt doing  well with most meals now, so anticipate average intake will have significantly improved upon follow-up.   UOP: 1.3L x24 hours  Medications: colace, SSI TID w/ meals and at bedtime, 35 units levemir BID, mvi with minerals, protonix, miralax, vitamin B12 Labs reviewed.  CBGs 100-156  Diet Order:   Diet Order            Diet regular Room service appropriate? Yes with Assist; Fluid consistency: Thin  Diet effective now                 EDUCATION NEEDS:   Not appropriate for education at this time  Skin:  Skin Assessment: Skin Integrity Issues: Skin Integrity Issues:: Stage III Stage III: R buttocks Unstageable: DTPI to the left foot and sacral area Incisions: penis  Last BM:  5/10 type 4  Height:   Ht Readings from Last 1 Encounters:  12/25/20 6\' 2"  (1.88 m)    Weight:   Wt Readings from Last 1 Encounters:  01/19/21 118.7 kg    Ideal Body Weight:  86.4 kg  BMI:  Body mass index is 33.6 kg/m.  Estimated Nutritional Needs:   Kcal:  2160-2590  Protein:  173-216 gm  Fluid:  >/= 2 L    05-22-1998, MS, RD, LDN RD pager number and weekend/on-call pager number located in Amion.

## 2021-01-21 NOTE — Progress Notes (Signed)
Physical Therapy Treatment Patient Details Name: [name] MRN: [id number] DOB: 06-23-1961 Today's Date: 01/21/2021    History of Present Illness 60 year old white male who presented to the emergency room from home 12/10/20.  Pt had 4-5 days of N/V/D. Pt became more confused and at times belligerent. Admitted for hypoxemic respiratory failure and septic shock 2/2 E. Coli bacteremia and UTI and was intubated. Extubated 4/16, now on cortrak. dense LUE paresis; CT head "late subacute to chronic infarct within the  posterior right frontal lobe"    PT Comments    Patient's BP tolerated elevation better again today, however increased back pain continues to limit ability to progress tilt/elevation >38 degrees today. Total time tilting 14 minutes. Patient also now reporting rt groin pain that began since he began using tilt bed.   SCI- Orthostatic BPs HOB 10 degrees 114/82  HOB 15 degrees 119/78  HOB 20 degrees 114/69  HOB 24 degrees 112/72  HOB 32 degrees 108/74  HOB 38 degrees 104/63        Follow Up Recommendations  SNF     Equipment Recommendations  None recommended by PT    Recommendations for Other Services       Precautions / Restrictions Precautions Precautions: Fall;Other (comment) Precaution Comments: foley catheter    Mobility  Bed Mobility                 Start Time: 1144 Angle: 38 degrees Total Minutes in Angle: 14 minutes (slowly increased (5 degree increments) over course of 14 minutes; immediately on rising to 38 degrees back pain became too severe and had to return to 0)  Transfers                    Ambulation/Gait             General Gait Details: unable   Stairs             Wheelchair Mobility    Modified Rankin (Stroke Patients Only)       Balance                                            Cognition Arousal/Alertness: Awake/alert Behavior During Therapy: WFL for tasks assessed/performed                                    General Comments: Unsure if pt confused vs miscommunication (stating someone from PT came in and told him he would not be tilting today but would tilt on Friday)      Exercises General Exercises - Lower Extremity Quad Sets: AROM;Both;10 reps (while tilted; alternating legs)    General Comments        Pertinent Vitals/Pain Pain Assessment: 0-10 Pain Score: 8  Pain Location: back Pain Descriptors / Indicators: Restless;Grimacing Pain Intervention(s): Limited activity within patient's tolerance;Monitored during session;Premedicated before session;Repositioned    Home Living                      Prior Function            PT Goals (current goals can now be found in the care plan section) Acute Rehab PT Goals Patient Stated Goal: to tolerate being upright without BP dropping and with less pain Time For Goal Achievement: 02/02/21  Potential to Achieve Goals: Fair Progress towards PT goals: Not progressing toward goals - comment (limited by back pain)    Frequency    Min 2X/week      PT Plan Current plan remains appropriate    Co-evaluation              AM-PAC PT "6 Clicks" Mobility   Outcome Measure  Help needed turning from your back to your side while in a flat bed without using bedrails?: A Little Help needed moving from lying on your back to sitting on the side of a flat bed without using bedrails?: Total Help needed moving to and from a bed to a chair (including a wheelchair)?: Total Help needed standing up from a chair using your arms (e.g., wheelchair or bedside chair)?: Total Help needed to walk in hospital room?: Total Help needed climbing 3-5 steps with a railing? : Total 6 Click Score: 8    End of Session   Activity Tolerance: Patient limited by pain Patient left: with call bell/phone within reach;in bed   PT Visit Diagnosis: Muscle weakness (generalized) (M62.81);Difficulty in walking, not  elsewhere classified (R26.2);Other abnormalities of gait and mobility (R26.89)     Time: 1127-1208 PT Time Calculation (min) (ACUTE ONLY): 41 min  Charges:  $Therapeutic Activity: 38-52 mins                      Jerolyn Center, PT Pager [phone]    Zena Amos 01/21/2021, 12:28 PM

## 2021-01-22 ENCOUNTER — Ambulatory Visit: Payer: Self-pay | Admitting: Infectious Disease

## 2021-01-22 LAB — GLUCOSE, CAPILLARY
Glucose-Capillary: 88 mg/dL (ref 70–99)
Glucose-Capillary: 72 mg/dL (ref 70–99)
Glucose-Capillary: 145 mg/dL — ABNORMAL HIGH (ref 70–99)
Glucose-Capillary: 144 mg/dL — ABNORMAL HIGH (ref 70–99)

## 2021-01-22 NOTE — Progress Notes (Signed)
CSW was notified by Georgeann Oppenheim at [facility] who states facility will not be able to offer patient a bed.  Edwin Dada, MSW, LCSW Transitions of Care  Clinical Social Worker II [phone]

## 2021-01-22 NOTE — Progress Notes (Signed)
Occupational Therapy Treatment Patient Details Name: [name] MRN: [id number] DOB: 13-Feb-1961 Today's Date: 01/22/2021    History of present illness 60 year old white male who presented to the emergency room from home 12/10/20.  Pt had 4-5 days of N/V/D. Pt became more confused and at times belligerent. Admitted for hypoxemic respiratory failure and septic shock 2/2 E. Coli bacteremia and UTI and was intubated. Extubated 4/16, now on cortrak. dense LUE paresis; CT head "late subacute to chronic infarct within the  posterior right frontal lobe"   OT comments  Pt seen for tilt to increase upright tolerance.  He was able to tolerate longer time upright in tilt up to 30* with pain remaining stable and BP remaining stable 113/75 with a drop to 99/73 when moved to 34*, but increased to 108/79 after ~5 mins.  Ultimately buttock pain increased causing him to have to return to supine.   Follow Up Recommendations  SNF    Equipment Recommendations  None recommended by OT    Recommendations for Other Services      Precautions / Restrictions Precautions Precautions: Fall;Other (comment) Precaution Comments: foley catheter       Mobility Bed Mobility   Bed Mobility: Rolling Rolling: Supervision              Transfers                      Balance                                           ADL either performed or assessed with clinical judgement   ADL Overall ADL's : Needs assistance/impaired Eating/Feeding: Set up;Bed level                           Toileting- Clothing Manipulation and Hygiene: Total assistance;Bed level Toileting - Clothing Manipulation Details (indicate cue type and reason): requires total A for peri care in supine after large BM             Vision       Perception     Praxis      Cognition Arousal/Alertness: Awake/alert Behavior During Therapy: WFL for tasks assessed/performed Overall Cognitive Status:  Impaired/Different from baseline Area of Impairment: Attention;Memory                     Memory: Decreased short-term memory         General Comments: pt with decreased recall/carry over of info during session        Exercises Other Exercises Other Exercises: Began tilt at 0919 with BP 113/75 and HR 98;  BP and back pain stable for 44 mins with tilt to 30*.  Once he was tilted to 34*, BP 99/73, HR 101, but pt denied dizziness.  5 mins later BP 108/79, HR 98, but pt with c/o pain in his buttock and required return to supine   Shoulder Instructions       General Comments [employer] present during session    Pertinent Vitals/ Pain       Pain Assessment: 0-10 Pain Score: 7  Pain Location: back Pain Descriptors / Indicators: Sharp Pain Intervention(s): Premedicated before session;Monitored during session  Home Living  Prior Functioning/Environment              Frequency  Min 2X/week        Progress Toward Goals  OT Goals(current goals can now be found in the care plan section)  Progress towards OT goals: Progressing toward goals     Plan Discharge plan remains appropriate    Co-evaluation                 AM-PAC OT "6 Clicks" Daily Activity     Outcome Measure   Help from another person eating meals?: A Little Help from another person taking care of personal grooming?: A Little Help from another person toileting, which includes using toliet, bedpan, or urinal?: Total Help from another person bathing (including washing, rinsing, drying)?: A Lot Help from another person to put on and taking off regular upper body clothing?: A Lot Help from another person to put on and taking off regular lower body clothing?: Total 6 Click Score: 12    End of Session    OT Visit Diagnosis: Unsteadiness on feet (R26.81) Hemiplegia - Right/Left: Left Hemiplegia - dominant/non-dominant:  Non-Dominant Hemiplegia - caused by: Cerebral infarction   Activity Tolerance Patient limited by pain   Patient Left in bed;with call bell/phone within reach   Nurse Communication Mobility status        Time: 2440-1027 OT Time Calculation (min): 61 min  Charges: OT General Charges $OT Visit: 1 Visit OT Treatments $Therapeutic Activity: 53-67 mins  Eber Jones., OTR/L Acute Rehabilitation Services Pager [phone] Office [phone]    Jeani Hawking M 01/22/2021, 10:30 AM

## 2021-01-22 NOTE — Progress Notes (Signed)
Physical Therapy Wound Treatment and Discharge Patient Details  Name: [name] MRN: [id number] Date of Birth: 1960-09-15  Today's Date: 01/22/2021 Time: 0815-0850 Time Calculation (min): 35 min  Subjective  Subjective Assessment Subjective: Pleasant and agreeable to hydrotherapy. Patient and Family Stated Goals: None stated throughout session. Date of Onset:  (Unknown) Prior Treatments:  (Dressing changes)  Pain Score:  Pt did not complain of any pain throughout session.   Wound Assessment  Pressure Injury 12/21/20 Buttocks Right Stage 4 - Full thickness tissue loss with exposed bone, tendon or muscle. (Active)  Wound Image   01/22/21 0918  Dressing Type ABD;Barrier Film (skin prep);Gauze (Comment);Moist to moist;Santyl 01/22/21 0918  Dressing Changed;Clean;Dry;Intact 01/22/21 0918  Dressing Change Frequency Daily 01/22/21 0918  State of Healing Early/partial granulation 01/22/21 0918  Site / Wound Assessment Pink;Red;Yellow 01/22/21 0918  % Wound base Red or Granulating 80% 01/22/21 0918  % Wound base Yellow/Fibrinous Exudate 20% 01/22/21 0918  % Wound base Black/Eschar 0% 01/22/21 0918  % Wound base Other/Granulation Tissue (Comment) 0% 01/22/21 0918  Peri-wound Assessment Intact 01/22/21 0918  Wound Length (cm) 9 cm 01/22/21 0800  Wound Width (cm) 6 cm 01/22/21 0800  Wound Depth (cm) 6.3 cm 01/22/21 0800  Wound Surface Area (cm^2) 54 cm^2 01/22/21 0800  Wound Volume (cm^3) 340.2 cm^3 01/22/21 0800  Tunneling (cm) 0 01/15/21 1441  Undermining (cm) 0 01/15/21 1441  Margins Unattached edges (unapproximated) 01/22/21 0918  Drainage Amount Minimal 01/22/21 0918  Drainage Description Serosanguineous 01/22/21 1638  Treatment Debridement (Selective);Hydrotherapy (Pulse lavage);Packing (Saline gauze) 01/22/21 0918   Hydrotherapy Pulsed lavage therapy - wound location: R buttock Pulsed Lavage with Suction (psi): 12 psi Pulsed Lavage with Suction - Normal Saline Used: 1000  mL Pulsed Lavage Tip: Tip with splash shield Selective Debridement Selective Debridement - Location: R buttock Selective Debridement - Tools Used: Forceps,Scalpel Selective Debridement - Tissue Removed: unviable tissue    Wound Assessment and Plan  Wound Therapy - Assess/Plan/Recommendations Wound Therapy - Clinical Statement: Assessed wound with Dawn, WOC-RN and agree that wound is appropriate for dressing changes via nursing staff. Hydrotherapy will sign off at this time. If needs change, please reconsult. Wound Therapy - Functional Problem List: Global weakness in the setting of critical illness and extended ICU stay. Factors Delaying/Impairing Wound Healing: Immobility,Multiple medical problems,Polypharmacy,Infection - systemic/local Hydrotherapy Plan: Dressing change,Patient/family education,Pulsatile lavage with suction,Debridement Wound Therapy - Frequency: 3X / week (hydrotherapy MWF with nursing to continue with daily dressing changes in between.) Wound Therapy - Follow Up Recommendations: dressing changes by RN  Wound Therapy Goals- Improve the function of patient's integumentary system by progressing the wound(s) through the phases of wound healing (inflammation - proliferation - remodeling) by: Wound Therapy Goals - Improve the function of patient's integumentary system by progressing the wound(s) through the phases of wound healing by: Decrease Necrotic Tissue to: 25% Decrease Necrotic Tissue - Progress: Met Increase Granulation Tissue to: 75% Increase Granulation Tissue - Progress: Met Improve Drainage Characteristics: Min,Serous Improve Drainage Characteristics - Progress: Partly met Goals/treatment plan/discharge plan were made with and agreed upon by patient/family: No, Patient unable to participate in goals/treatment/discharge plan and family unavailable Time For Goal Achievement: 7 days Wound Therapy - Potential for Goals: Good  Goals will be updated until maximal  potential achieved or discharge criteria met.  Discharge criteria: when goals achieved, discharge from hospital, MD decision/surgical intervention, no progress towards goals, refusal/missing three consecutive treatments without notification or medical reason.  GP     Charges PT Wound  Care Charges $Wound Debridement up to 20 cm: < or equal to 20 cm $PT Hydrotherapy Dressing: 1 dressing $PT PLS Gun and Tip: 1 Supply $PT Hydrotherapy Visit: 1 Visit       Thelma Comp 01/22/2021, 9:22 AM  Rolinda Roan, PT, DPT Acute Rehabilitation Services Pager: [phone] Office: [phone]

## 2021-01-22 NOTE — Progress Notes (Signed)
PROGRESS NOTE  [name]  LKG:[id number] DOB: April 04, 1961 DOA: 12/10/2020 PCP: Pcp, No   Brief Narrative: 60 year old male with past medical history significant for essential hypertension, hyperlipidemia who presented to [facility] ED on 3/31 for a 5-day history of nausea/vomiting/diarrhea and confusion. Patient was initially admitted to [facility] service for acute hypoxic respiratory failure requiring intubation and ventilatory support with septic shock secondary to E. coli septicemia, UTI, MSSA Pneumonia, MRSE bacteremia (CLABSI involving left IJ vein and subclavian vein), with possible endovascular infection. Infectious disease followed during hospital course.  Patient transferred from [facility] to [facility] on 01/01/2021 for further management of ongoing metabolic encephalopathy, tachycardia, dysphagia requiring tube feeds, intermittent fevers and prolonged antibiotic course.  Assessment & Plan: Active Problems:   Acute respiratory failure (HCC)   AKI (acute kidney injury) ([place])   Bacteremia due to Escherichia coli   Ureteral stone   Pyelonephritis   Cerebral thrombosis with cerebral infarction   Staphylococcus epidermidis bacteremia   Pressure injury of skin   Dysphagia, pharyngoesophageal phase   Physical deconditioning  Acute hypoxic respiratory failure: Resolved MSSA pneumonia --Presented with hypoxia s/p mechanical ventilation due to sepsis physiology  MRSE endovascular infection/septicemia E. coli UTI/septicemia Septic shock, POA --Infectious disease following, appreciate assistance --Continue daptomycin 900 mg IV daily x 6 weeks from 4/24(~02/14/21) *5/4 discussed with ID Dr. Montel Culver who recommended continuing IV antibiotics for 3 to 4 weeks before discussing potential transition to other oral treatment 5/2 FU blood cxs neg  Acute hyponatremia with dehydration and associated recurrent orthostasis --5/12 sodium has normalized to 138 after IVFs --Patient had episode of  orthostasis during PT session on 5/11 --Continue normal saline at 100 cc/h -- UA: Large hemoglobin, trace leukocytes, rare bacteria-likely baseline given chronic double-J stent.  After adequately hydrated can repeat UA  Back pain w/ activity: No radiculopathy noted.  - Continue oxycontin and prn oxycodone in addition to antispasmodic and lidoderm patch. Mattress overlay.   Acute toxic metabolic encephalopathy/ongoing delirium/subacute vs chronic infarct with dense insensate LUE hemiparesis -Acute encephalopathy resolved --continue asa and statin -- Continue PT; KREG bed   Stage 4 right buttock decubitus wound: Hospital-acquired. Completing hydrotherapy 5/13. No evidence of infection. - Continue santyl enzymatic debridement for deep tunneling area.  - Offload the area as much as possible.   - Would recommend wound care center follow up.  Type 2 diabetes mellitus --Home med: Glipizide --Hemoglobin A1c 6.8. --Lantus 35 u BID   Mild constipation --One-time dose of milk of magnesia effective; continue MiraLAX and Colace  Left IJ/subclavian DVT versus septic thrombus -Left IJ central line subsequently discontinued and RUE PICC line removed 4/24  --Repeat ultrasound duplex scan 4/25 with resolution of DVT. --Continue Lovenox therapeutic dose  Essential hypertension --Continue carvedilol   Recurrent pyelonephritis with obstructive hydronephrosis --s/p left ureteral stent 4/6.  --Repeat ultrasound with mild left hydronephrosis. CT abdomen with stable position of double-J stent.  Dysphagia/mild protein calorie malnutrition/morbid obesity -- Continue regular diet --Estimated body mass index is 33.6 kg/m as calculated from the following:   Height as of this encounter: _0  (1.88 m).   Weight as of this encounter: 118.7 kg.  Weakness/deconditioning/debility: --Continue PT/OT  -Continue KREG tilt bed  -May need vaccine if required by rehabilitation facility he states he  had COVID in [place] 5/10 positive  Goals of care: --Palliative care evaluated during hospitalization    Other problems: NASH cirrhosis: Outpatient follow-up with gastroenterology  Mild hyponatremia --Resolved  Hypokalemia --Resolved  Acute renal failure -- Resolved  HLD: -- Continue atorvastatin 40 mg daily  Atypical chest pain --Suspect GI etiology -cont. Protonix initiated and encourage upright after meals for at least 30 minutes  DVT prophylaxis: Eliquis Code Status: Full Family Communication: None at bedside today Disposition Plan:  Status is: Inpatient  Remains inpatient appropriate because:Unsafe d/c plan   Dispo: The patient is from: Home              Anticipated d/c is to: SNF              Patient currently is not medically stable to d/c.   Difficult to place patient Yes  Procedures/significant Hospital events:  Intubation 3/31  Triple-lumen central line placement 3/31  4/1 off pressors. E coli bacteremia, e coli uti. Some improvement in pulm opacities on CXR. On rocephin. TTE with LVEF 40-45%, LV global hypokinesis, G2DD, normal RV, dilated aortic root at 68m.  Bronchoscopy with BAL 4/5, Dr. STamala Julian 4/5 extubated, failed immediately due to mucous plugging identified on bronch after re-intubated. CT ABD with 446mleft ureteral stone with mild edema of left kideny, large areas of consolidation in bilateral lung bases.   4/6 urology consulted for distal left ureteral stone, to OR overnight for stent placement, UCx sent. Versed added for agitation (on fent / precedex)  4/7 Off vasopressors  4/11 Vasopressors restarted   4/13: Left IJ holiday  4/15: Right IJ placed   4/16: extubated, remained agitated and restless requiring IV Precedex infusion  4/18: Cortraktube insertion and removed 5/4  Antimicrobials:   Daptomycin 4/26>>(End 6/5)  Cefazolin 4/4 - 4/6, 4/28>>(End 5/4)  Metronidazole 4/27>>(End 5/4)  Cefepime  4/6 - 4/7  Ceftriaxone 3/31 - 3/31, 4/7 - 4/11, 4/14 - 4/15  Vancomycin 3/31 - 3/31, 4/14 - 4/24, 4/26 - 4/28  Zosyn 3/31 - 3/31  Subjective: Cold requesting increased temperature on thermostat, otherwise pain is controlled at rest, worse with pressure on right buttock/back. BPs stabilizing some. No other complaints.   Objective: Vitals:   01/21/21 1641 01/21/21 2148 01/22/21 0810 01/22/21 1229  BP: 129/76 122/77 137/83 119/77  Pulse: 97 96 99 96  Resp: _0 Temp: 98.3 F (36.8 C) 98 F (36.7 C)  98.7 F (37.1 C)  TempSrc:  Oral  Oral  SpO2: 97% 98%  94%  Weight:      Height:        Intake/Output Summary (Last 24 hours) at 01/22/2021 1357 Last data filed at 01/22/2021 1344 Gross per 24 hour  Intake 2380 ml  Output 4800 ml  Net -2420 ml   Filed Weights   01/03/21 0500 01/09/21 0500 01/19/21 0700  Weight: 120.6 kg 132 kg 118.7 kg    Gen: [age].o. male in no distress Pulm: Non-labored breathing room air. Clear to auscultation bilaterally.  CV: Regular rate and rhythm. No murmur, rub, or gallop. No JVD, no pitting pedal edema. GI: Abdomen soft, non-tender, non-distended, with normoactive bowel sounds. No organomegaly or masses felt. Ext: Warm, no deformities Skin: Right buttock wound as pictured in PT note today. No odor. Neuro: Alert and oriented. Complete LUE paralysis and sensory deficit. Psych: Judgement and insight appear normal. Mood & affect appropriate.   Data Reviewed: I have personally reviewed following labs and imaging studies  CBC: Recent Labs  Lab 01/17/21 0222 01/20/21 1048 01/21/21 0211  WBC 7.8 7.0 6.6  NEUTROABS  --  4.5  --   HGB 9.2* 9.5* 9.4*  HCT 29.2* 30.1* 30.4*  MCV 98.3 98.0  99.3  PLT 267 273 782   Basic Metabolic Panel: Recent Labs  Lab 01/20/21 1048 01/21/21 0211  NA 133* 138  K 3.9 3.9  CL 95* 99  CO2 30 32  GLUCOSE 154* 104*  BUN 12 11  CREATININE 0.60* 0.56*  CALCIUM 8.5* 8.8*   GFR: Estimated Creatinine  Clearance: 137.8 mL/min (A) (by C-G formula based on SCr of 0.56 mg/dL (L)). Liver Function Tests: Recent Labs  Lab 01/20/21 1048  AST 17  ALT 12  ALKPHOS 77  BILITOT 0.3  PROT 6.5  ALBUMIN 2.0*   No results for input(s): LIPASE, AMYLASE in the last 168 hours. No results for input(s): AMMONIA in the last 168 hours. Coagulation Profile: No results for input(s): INR, PROTIME in the last 168 hours. Cardiac Enzymes: Recent Labs  Lab 01/19/21 1027  CKTOTAL 41*   BNP (last 3 results) No results for input(s): PROBNP in the last 8760 hours. HbA1C: No results for input(s): HGBA1C in the last 72 hours. CBG: Recent Labs  Lab 01/21/21 1255 01/21/21 1600 01/21/21 1920 01/22/21 0753 01/22/21 1229  GLUCAP 152* 150* 194* 88 72   Lipid Profile: No results for input(s): CHOL, HDL, LDLCALC, TRIG, CHOLHDL, LDLDIRECT in the last 72 hours. Thyroid Function Tests: No results for input(s): TSH, T4TOTAL, FREET4, T3FREE, THYROIDAB in the last 72 hours. Anemia Panel: No results for input(s): VITAMINB12, FOLATE, FERRITIN, TIBC, IRON, RETICCTPCT in the last 72 hours. Urine analysis:    Component Value Date/Time   COLORURINE YELLOW 01/20/2021 1010   APPEARANCEUR CLEAR 01/20/2021 1010   LABSPEC 1.005 01/20/2021 1010   PHURINE 6.0 01/20/2021 1010   GLUCOSEU NEGATIVE 01/20/2021 1010   HGBUR LARGE (A) 01/20/2021 1010   BILIRUBINUR NEGATIVE 01/20/2021 1010   KETONESUR NEGATIVE 01/20/2021 1010   PROTEINUR NEGATIVE 01/20/2021 1010   NITRITE NEGATIVE 01/20/2021 1010   LEUKOCYTESUR TRACE (A) 01/20/2021 1010   No results found for this or any previous visit (from the past 240 hour(s)).    Radiology Studies: No results found.  Scheduled Meds: . apixaban  5 mg Oral BID  . aspirin  81 mg Oral Daily  . atorvastatin  40 mg Oral Daily  . carvedilol  3.125 mg Oral BID WC  . chlorhexidine  15 mL Mouth Rinse BID  . Chlorhexidine Gluconate Cloth  6 each Topical Daily  . collagenase   Topical Daily   . diclofenac Sodium  2 g Topical QID  . docusate  50 mg Oral Daily  . DULoxetine  30 mg Oral Daily  . feeding supplement  237 mL Oral TID BM  . insulin aspart  0-5 Units Subcutaneous QHS  . insulin aspart  0-9 Units Subcutaneous TID WC  . insulin detemir  35 Units Subcutaneous BID  . lidocaine  1 patch Transdermal Q24H  . mouth rinse  15 mL Mouth Rinse q12n4p  . methocarbamol  750 mg Oral TID AC & HS  . multivitamin with minerals  1 tablet Oral Daily  . oxyCODONE  10 mg Oral Q12H  . pantoprazole  40 mg Oral BID AC  . polyethylene glycol  17 g Oral Daily  . QUEtiapine  50 mg Oral QHS  . sodium chloride flush  10-40 mL Intracatheter Q12H  . vitamin B-12  100 mcg Oral Daily  . zolpidem  10 mg Oral QHS   Continuous Infusions: . sodium chloride 50 mL (01/19/21 2154)  . sodium chloride 100 mL/hr at 01/21/21 1609  . DAPTOmycin (CUBICIN)  IV Stopped (01/22/21  0755)     LOS: 43 days   Time spent: 25 minutes.  Patrecia Pour, MD [facility] [email or web address] 01/22/2021, 1:57 PM

## 2021-01-22 NOTE — Consult Note (Addendum)
WOC Nurse wound follow up Stage 4 pressure injury to right buttock; this was a hospital-acquired pressure injury. Assessed with physical therapy during hydrotherapy session.  They state that this treatment has delivered the full benefits that are possible to assist with removal of nonviable tissue and it can be discontinued today. Refer to PT notes for wound percentages, also refer to photos in the EMR. Outer wound is beefy red and moist with small amt pink drainage, no odor, 9X6X6.3cm Tunneling area remains with yellow slough that is difficult to reach and will need continued Santyl for enzymatic debridement. Discussed plan of care with patient and he looked at the photo and discussed dressing changes and tunneling area of the wound. He verbalized understanding that this wound will be slow to heal related to the size and depth. Recommend follow-up at the outpatient wound care cernter after discharge for continued monitoring; this must be by obtained by physician referral.  Pt could also benefit from an air mattress after discharge to reduce pressure and home health for dressing change assistance; please order if desired.  Dressing procedure/placement/frequency: Topical treatment orders provided for bedside nurses to perform as follows to assist with removal of nonviable tissue: Apply Santyl Q day to right buttock in a nickel thick layer. Pack with saline moistened gauze, using swab to fill, then ABD pad.   WOC team will continue to assess the wound weekly to determine if a change in the plan of care is indicated at that time.  Cammie Mcgee MSN, RN, CWOCN, [place], CNS [phone]

## 2021-01-23 LAB — GLUCOSE, CAPILLARY
Glucose-Capillary: 103 mg/dL — ABNORMAL HIGH (ref 70–99)
Glucose-Capillary: 99 mg/dL (ref 70–99)
Glucose-Capillary: 159 mg/dL — ABNORMAL HIGH (ref 70–99)
Glucose-Capillary: 119 mg/dL — ABNORMAL HIGH (ref 70–99)

## 2021-01-23 MED ORDER — OXYCODONE HCL 5 MG PO TABS
10.0000 mg | ORAL_TABLET | ORAL | Status: DC | PRN
  Administered 2021-01-23 – 2021-02-02 (×15): 10 mg via ORAL
  Filled 2021-01-23 (×15): qty 2

## 2021-01-23 NOTE — Progress Notes (Signed)
PROGRESS NOTE  [name]  ZOX:[id number] DOB: [birth year] DOA: 12/10/2020 PCP: Pcp, No   Brief Narrative: 60 year old male with past medical history significant for essential hypertension, hyperlipidemia who presented to [facility] ED on 3/31 for a 5-day history of nausea/vomiting/diarrhea and confusion. Patient was initially admitted to [facility] service for acute hypoxic respiratory failure requiring intubation and ventilatory support with septic shock secondary to E. coli septicemia, UTI, MSSA Pneumonia, MRSE bacteremia (CLABSI involving left IJ vein and subclavian vein), with possible endovascular infection. Infectious disease followed during hospital course.  Patient transferred from [facility] to [facility] on 01/01/2021 for further management of ongoing metabolic encephalopathy, tachycardia, dysphagia requiring tube feeds, intermittent fevers and prolonged antibiotic course.  Assessment & Plan: Active Problems:   Acute respiratory failure (HCC)   AKI (acute kidney injury) ([place])   Bacteremia due to Escherichia coli   Ureteral stone   Pyelonephritis   Cerebral thrombosis with cerebral infarction   Staphylococcus epidermidis bacteremia   Pressure injury of skin   Dysphagia, pharyngoesophageal phase   Physical deconditioning  Acute hypoxic respiratory failure: Resolved MSSA pneumonia --Presented with hypoxia s/p mechanical ventilation due to sepsis physiology  MRSE endovascular infection/septicemia E. coli UTI/septicemia Septic shock, POA --Infectious disease following, appreciate assistance --Continue daptomycin 900 mg IV daily x 6 weeks from 4/24(~02/14/21) *5/4 discussed with ID Dr. Montel Culver who recommended continuing IV antibiotics for 3 to 4 weeks before discussing potential transition to other oral treatment 5/2 FU blood cxs neg - Continue weekly CK  Acute hyponatremia with dehydration and associated recurrent orthostasis --5/12 sodium has normalized to 138 after IVFs --Patient  had episode of orthostasis during PT session on 5/11 --Continue normal saline at 100 cc/h -- UA: Large hemoglobin, trace leukocytes, rare bacteria-likely baseline given chronic double-J stent.  After adequately hydrated can repeat UA  Back pain w/ activity: No radiculopathy noted.  - Continue oxycontin and prn oxycodone, will increase to 29m dosing with no report of sedation and to facilitate working with PT. Continue antispasmodic and lidoderm patch. Mattress overlay.   Acute toxic metabolic encephalopathy/ongoing delirium/subacute vs chronic infarct with dense insensate LUE hemiparesis -Acute encephalopathy resolved --continue asa and statin -- Continue PT; KREG bed   Stage 4 right buttock decubitus wound: Hospital-acquired. Completing hydrotherapy 5/13. No evidence of infection. - Continue santyl enzymatic debridement for deep tunneling area.  - Offload the area as much as possible.   - Would recommend wound care center follow up.  Type 2 diabetes mellitus --Home med: Glipizide --Hemoglobin A1c 6.8. --Lantus 35 u BID   Mild constipation --One-time dose of milk of magnesia effective; continue MiraLAX and Colace  Left IJ/subclavian DVT versus septic thrombus -Left IJ central line subsequently discontinued and RUE PICC line removed 4/24  --Repeat ultrasound duplex scan 4/25 with resolution of DVT. --Continue Lovenox therapeutic dose  Essential hypertension --Continue carvedilol   Recurrent pyelonephritis with obstructive hydronephrosis --s/p left ureteral stent 4/6.  --Repeat ultrasound with mild left hydronephrosis. CT abdomen with stable position of double-J stent.  Dysphagia/mild protein calorie malnutrition/morbid obesity -- Continue regular diet --Estimated body mass index is 33.6 kg/m as calculated from the following:   Height as of this encounter: _0  (1.88 m).   Weight as of this encounter: 118.7 kg.  Weakness/deconditioning/debility: --Continue  PT/OT  -Continue KREG tilt bed  -May need vaccine if required by rehabilitation facility [place]  Goals of care: --Palliative care evaluated during hospitalization    Other problems: NASH  cirrhosis: Outpatient follow-up with gastroenterology  Mild hyponatremia --Resolved  Hypokalemia --Resolved  Acute renal failure -- Resolved  HLD: -- Continue atorvastatin 40 mg daily  Atypical chest pain --Suspect GI etiology -cont. Protonix initiated and encourage upright after meals for at least 30 minutes  DVT prophylaxis: Eliquis Code Status: Full Family Communication: None at bedside today Disposition Plan:  Status is: Inpatient  Remains inpatient appropriate because:Unsafe d/c plan   Dispo: The patient is from: Home              Anticipated d/c is to: SNF              Patient currently is not medically stable to d/c.   Difficult to place patient Yes  Procedures/significant Hospital events:  Intubation 3/31  Triple-lumen central line placement 3/31  4/1 off pressors. E coli bacteremia, e coli uti. Some improvement in pulm opacities on CXR. On rocephin. TTE with LVEF 40-45%, LV global hypokinesis, G2DD, normal RV, dilated aortic root at 76m.  Bronchoscopy with BAL 4/5, Dr. STamala Julian 4/5 extubated, failed immediately due to mucous plugging identified on bronch after re-intubated. CT ABD with 423mleft ureteral stone with mild edema of left kideny, large areas of consolidation in bilateral lung bases.   4/6 urology consulted for distal left ureteral stone, to OR overnight for stent placement, UCx sent. Versed added for agitation (on fent / precedex)  4/7 Off vasopressors  4/11 Vasopressors restarted   4/13: Left IJ holiday  4/15: Right IJ placed   4/16: extubated, remained agitated and restless requiring IV Precedex infusion  4/18: Cortraktube insertion and removed 5/4  Antimicrobials:   Daptomycin  4/26>>(End 6/5)  Cefazolin 4/4 - 4/6, 4/28>>(End 5/4)  Metronidazole 4/27>>(End 5/4)  Cefepime 4/6 - 4/7  Ceftriaxone 3/31 - 3/31, 4/7 - 4/11, 4/14 - 4/15  Vancomycin 3/31 - 3/31, 4/14 - 4/24, 4/26 - 4/28  Zosyn 3/31 - 3/31  Subjective: Pain in lower back bilaterally is nonradiating, no numbness or new weakness in legs/sciatica-type pain. Pain not controlled with oxycodone, limited PT.  Objective: Vitals:   01/22/21 1229 01/22/21 2100 01/23/21 0646 01/23/21 0957  BP: 119/77 119/76 130/84 124/76  Pulse: 96 99 (!) 103 (!) 108  Resp: _0 Temp: 98.7 F (37.1 C) 98 F (36.7 C) 98.3 F (36.8 C)   TempSrc: Oral Oral Oral   SpO2: 94% 94% 94%   Weight:      Height:        Intake/Output Summary (Last 24 hours) at 01/23/2021 1039 Last data filed at 01/23/2021 0100 Gross per 24 hour  Intake 810 ml  Output 4650 ml  Net -3840 ml   Filed Weights   01/03/21 0500 01/09/21 0500 01/19/21 0700  Weight: 120.6 kg 132 kg 118.7 kg   Gen: [age].o. male in no distress Pulm: Nonlabored breathing room air. Clear. CV: Regular rate and rhythm. No murmur, rub, or gallop. No JVD, no dependent edema. GI: Abdomen soft, non-tender, non-distended, with normoactive bowel sounds.  Ext: Warm, no deformities Skin: No new rashes, lesions or ulcers on visualized skin. Neuro: Alert and oriented. No new focal neurological deficits. NVI in legs. Dense LUE hemiparesis. Psych: Judgement and insight appear fair. Mood euthymic & affect congruent. Behavior is appropriate.    Data Reviewed: I have personally reviewed following labs and imaging studies  CBC: Recent Labs  Lab 01/17/21 0222 01/20/21 1048 01/21/21 0211  WBC 7.8 7.0 6.6  NEUTROABS  --  4.5  --  HGB 9.2* 9.5* 9.4*  HCT 29.2* 30.1* 30.4*  MCV 98.3 98.0 99.3  PLT 267 273 448   Basic Metabolic Panel: Recent Labs  Lab 01/20/21 1048 01/21/21 0211  NA 133* 138  K 3.9 3.9  CL 95* 99  CO2 30 32  GLUCOSE 154* 104*  BUN 12 11   CREATININE 0.60* 0.56*  CALCIUM 8.5* 8.8*   GFR: Estimated Creatinine Clearance: 137.8 mL/min (A) (by C-G formula based on SCr of 0.56 mg/dL (L)). Liver Function Tests: Recent Labs  Lab 01/20/21 1048  AST 17  ALT 12  ALKPHOS 77  BILITOT 0.3  PROT 6.5  ALBUMIN 2.0*   No results for input(s): LIPASE, AMYLASE in the last 168 hours. No results for input(s): AMMONIA in the last 168 hours. Coagulation Profile: No results for input(s): INR, PROTIME in the last 168 hours. Cardiac Enzymes: Recent Labs  Lab 01/19/21 1027  CKTOTAL 41*   BNP (last 3 results) No results for input(s): PROBNP in the last 8760 hours. HbA1C: No results for input(s): HGBA1C in the last 72 hours. CBG: Recent Labs  Lab 01/22/21 0753 01/22/21 1229 01/22/21 1724 01/22/21 2105 01/23/21 0805  GLUCAP 88 72 145* 144* 103*   Lipid Profile: No results for input(s): CHOL, HDL, LDLCALC, TRIG, CHOLHDL, LDLDIRECT in the last 72 hours. Thyroid Function Tests: No results for input(s): TSH, T4TOTAL, FREET4, T3FREE, THYROIDAB in the last 72 hours. Anemia Panel: No results for input(s): VITAMINB12, FOLATE, FERRITIN, TIBC, IRON, RETICCTPCT in the last 72 hours. Urine analysis:    Component Value Date/Time   COLORURINE YELLOW 01/20/2021 1010   APPEARANCEUR CLEAR 01/20/2021 1010   LABSPEC 1.005 01/20/2021 1010   PHURINE 6.0 01/20/2021 1010   GLUCOSEU NEGATIVE 01/20/2021 1010   HGBUR LARGE (A) 01/20/2021 1010   BILIRUBINUR NEGATIVE 01/20/2021 1010   KETONESUR NEGATIVE 01/20/2021 1010   PROTEINUR NEGATIVE 01/20/2021 1010   NITRITE NEGATIVE 01/20/2021 1010   LEUKOCYTESUR TRACE (A) 01/20/2021 1010   No results found for this or any previous visit (from the past 240 hour(s)).    Radiology Studies: No results found.  Scheduled Meds: . apixaban  5 mg Oral BID  . aspirin  81 mg Oral Daily  . atorvastatin  40 mg Oral Daily  . carvedilol  3.125 mg Oral BID WC  . chlorhexidine  15 mL Mouth Rinse BID  .  Chlorhexidine Gluconate Cloth  6 each Topical Daily  . collagenase   Topical Daily  . diclofenac Sodium  2 g Topical QID  . docusate  50 mg Oral Daily  . DULoxetine  30 mg Oral Daily  . feeding supplement  237 mL Oral TID BM  . insulin aspart  0-5 Units Subcutaneous QHS  . insulin aspart  0-9 Units Subcutaneous TID WC  . insulin detemir  35 Units Subcutaneous BID  . lidocaine  1 patch Transdermal Q24H  . mouth rinse  15 mL Mouth Rinse q12n4p  . methocarbamol  750 mg Oral TID AC & HS  . multivitamin with minerals  1 tablet Oral Daily  . oxyCODONE  10 mg Oral Q12H  . pantoprazole  40 mg Oral BID AC  . polyethylene glycol  17 g Oral Daily  . QUEtiapine  50 mg Oral QHS  . sodium chloride flush  10-40 mL Intracatheter Q12H  . vitamin B-12  100 mcg Oral Daily  . zolpidem  10 mg Oral QHS   Continuous Infusions: . sodium chloride 50 mL (01/19/21 2154)  . sodium chloride  75 mL/hr at 01/22/21 1503  . DAPTOmycin (CUBICIN)  IV Stopped (01/23/21 0715)     LOS: 44 days   Time spent: 25 minutes.  Patrecia Pour, MD [facility] [email or web address] 01/23/2021, 10:39 AM

## 2021-01-23 NOTE — Discharge Instructions (Signed)
Information on my medicine - ELIQUIS (apixaban)   Why was Eliquis prescribed for you? Eliquis was prescribed to treat blood clots that may have been found in the veins of your legs (deep vein thrombosis) or in your lungs (pulmonary embolism) and to reduce the risk of them occurring again.  What do You need to know about Eliquis ? Continue Eliquis 5mg  TWICE daily.  Eliquis may be taken with or without food.   Try to take the dose about the same time in the morning and in the evening. If you have difficulty swallowing the tablet whole please discuss with your pharmacist how to take the medication safely.  Take Eliquis exactly as prescribed and DO NOT stop taking Eliquis without talking to the doctor who prescribed the medication.  Stopping may increase your risk of developing a new blood clot.  Refill your prescription before you run out.  After discharge, you should have regular check-up appointments with your healthcare provider that is prescribing your Eliquis.    What do you do if you miss a dose? If a dose of ELIQUIS is not taken at the scheduled time, take it as soon as possible on the same day and twice-daily administration should be resumed. The dose should not be doubled to make up for a missed dose.  Important Safety Information A possible side effect of Eliquis is bleeding. You should call your healthcare provider right away if you experience any of the following: ? Bleeding from an injury or your nose that does not stop. ? Unusual colored urine (red or dark brown) or unusual colored stools (red or black). ? Unusual bruising for unknown reasons. ? A serious fall or if you hit your head (even if there is no bleeding).  Some medicines may interact with Eliquis and might increase your risk of bleeding or clotting while on Eliquis. To help avoid this, consult your healthcare provider or pharmacist prior to using any new prescription or non-prescription medications, including  herbals, vitamins, non-steroidal anti-inflammatory drugs (NSAIDs) and supplements.  This website has more information on Eliquis (apixaban): http://[email or web address].com/eliquis/home

## 2021-01-24 LAB — GLUCOSE, CAPILLARY
Glucose-Capillary: 73 mg/dL (ref 70–99)
Glucose-Capillary: 103 mg/dL — ABNORMAL HIGH (ref 70–99)
Glucose-Capillary: 115 mg/dL — ABNORMAL HIGH (ref 70–99)
Glucose-Capillary: 119 mg/dL — ABNORMAL HIGH (ref 70–99)

## 2021-01-24 LAB — CBC
HCT: 28.1 % — ABNORMAL LOW (ref 39.0–52.0)
Hemoglobin: 8.9 g/dL — ABNORMAL LOW (ref 13.0–17.0)
MCH: 30.4 pg (ref 26.0–34.0)
MCHC: 31.7 g/dL (ref 30.0–36.0)
MCV: 95.9 fL (ref 80.0–100.0)
Platelets: 278 10*3/uL (ref 150–400)
RBC: 2.93 MIL/uL — ABNORMAL LOW (ref 4.22–5.81)
RDW: 15 % (ref 11.5–15.5)
WBC: 7.4 10*3/uL (ref 4.0–10.5)
nRBC: 0 % (ref 0.0–0.2)

## 2021-01-24 LAB — BASIC METABOLIC PANEL
Anion gap: 8 (ref 5–15)
BUN: 11 mg/dL (ref 6–20)
CO2: 29 mmol/L (ref 22–32)
Calcium: 8.8 mg/dL — ABNORMAL LOW (ref 8.9–10.3)
Chloride: 97 mmol/L — ABNORMAL LOW (ref 98–111)
Creatinine, Ser: 0.53 mg/dL — ABNORMAL LOW (ref 0.61–1.24)
GFR, Estimated: >60 mL/min (ref >60)
Glucose, Bld: 95 mg/dL (ref 70–99)
Potassium: 3.9 mmol/L (ref 3.5–5.1)
Sodium: 134 mmol/L — ABNORMAL LOW (ref 135–145)

## 2021-01-24 NOTE — Progress Notes (Signed)
PROGRESS NOTE  [name]  KYH:[id number] DOB: [birth year] DOA: 12/10/2020 PCP: Pcp, No   Brief Narrative: 60 year old male with past medical history significant for essential hypertension, hyperlipidemia who presented to [facility] ED on 3/31 for a 5-day history of nausea/vomiting/diarrhea and confusion. Patient was initially admitted to [facility] service for acute hypoxic respiratory failure requiring intubation and ventilatory support with septic shock secondary to E. coli septicemia, UTI, MSSA Pneumonia, MRSE bacteremia (CLABSI involving left IJ vein and subclavian vein), with possible endovascular infection. Infectious disease followed during hospital course.  Patient transferred from [facility] to [facility] on 01/01/2021 for further management of ongoing metabolic encephalopathy, tachycardia, dysphagia requiring tube feeds, intermittent fevers and prolonged antibiotic course.  Assessment & Plan: Active Problems:   Acute respiratory failure (HCC)   AKI (acute kidney injury) ([place])   Bacteremia due to Escherichia coli   Ureteral stone   Pyelonephritis   Cerebral thrombosis with cerebral infarction   Staphylococcus epidermidis bacteremia   Pressure injury of skin   Dysphagia, pharyngoesophageal phase   Physical deconditioning  Acute hypoxic respiratory failure: Resolved MSSA pneumonia Presented with hypoxia s/p mechanical ventilation due to sepsis physiology  MRSE endovascular infection/septicemia E. coli UTI/septicemia Septic shock, POA --Infectious disease following, appreciate assistance --Continue daptomycin 900 mg IV daily x 6 weeks from 4/24(~02/14/21) *5/4 discussed with ID Dr. Montel Culver who recommended continuing IV antibiotics for 3 to 4 weeks before discussing potential transition to other oral treatment 5/2 FU blood cxs neg - Continue weekly CK (has been reassuring)  Acute hyponatremia with dehydration and associated recurrent orthostasis: Pt taking adequate per oral intake. Will  DC IVF.   Microscopic hematuria:  - Repeat UA nonurgently.   Back pain w/ activity: No radiculopathy noted.  - Continue oxycontin and prn oxycodone, monitor with increased 49m dosing with no report of sedation and to facilitate working with PT. Continue antispasmodic and lidoderm patch. Mattress overlay.   Acute toxic metabolic encephalopathy/ongoing delirium/subacute vs chronic infarct with dense insensate LUE hemiparesis - Acute encephalopathy resolved --continue asa and statin -- Continue PT; KREG bed   Stage 4 right buttock decubitus wound: Hospital-acquired. Completing hydrotherapy 5/13. No evidence of infection. - Continue santyl enzymatic debridement for deep tunneling area.  - Offload the area as much as possible.   - Would recommend wound care center follow up.  Type 2 diabetes mellitus --Home med: Glipizide --Hemoglobin A1c 6.8. --Lantus 35 u BID   Mild constipation --One-time dose of milk of magnesia effective; continue MiraLAX and Colace  Left IJ/subclavian DVT versus septic thrombus -Left IJ central line subsequently discontinued and RUE PICC line removed 4/24  --Repeat ultrasound duplex scan 4/25 with resolution of DVT. --Continue Lovenox therapeutic dose  Essential hypertension --Continue carvedilol   Recurrent pyelonephritis with obstructive hydronephrosis --s/p left ureteral stent 4/6.  --Repeat ultrasound with mild left hydronephrosis. CT abdomen with stable position of double-J stent.  Dysphagia/mild protein calorie malnutrition/morbid obesity -- Continue regular diet --Estimated body mass index is 33.6 kg/m as calculated from the following:   Height as of this encounter: _0  (1.88 m).   Weight as of this encounter: 118.7 kg.  Weakness/deconditioning/debility: --Continue PT/OT  -Continue KREG tilt bed  -May need vaccine if required by rehabilitation facility [place]  Goals of  care: --Palliative care evaluated during hospitalization    Other problems: NASH cirrhosis: Outpatient follow-up with gastroenterology  Mild hyponatremia --Resolved  Hypokalemia --Resolved  Acute renal failure -- Resolved  HLD: --  Continue atorvastatin 40 mg daily  Atypical chest pain --Suspect GI etiology -cont. Protonix initiated and encourage upright after meals for at least 30 minutes  DVT prophylaxis: Eliquis Code Status: Full Family Communication: None at bedside this morning. Disposition Plan:  Status is: Inpatient  Remains inpatient appropriate because:Unsafe d/c plan   Dispo: The patient is from: Home              Anticipated d/c is to: SNF              Patient currently is not medically stable to d/c.   Difficult to place patient Yes; note patient has IgG Ab's to SARS-CoV-2 putatively consistent with immunity.  Procedures/significant Hospital events:  Intubation 3/31  Triple-lumen central line placement 3/31  4/1 off pressors. E coli bacteremia, e coli uti. Some improvement in pulm opacities on CXR. On rocephin. TTE with LVEF 40-45%, LV global hypokinesis, G2DD, normal RV, dilated aortic root at 45m.  Bronchoscopy with BAL 4/5, Dr. STamala Julian 4/5 extubated, failed immediately due to mucous plugging identified on bronch after re-intubated. CT ABD with 411mleft ureteral stone with mild edema of left kideny, large areas of consolidation in bilateral lung bases.   4/6 urology consulted for distal left ureteral stone, to OR overnight for stent placement, UCx sent. Versed added for agitation (on fent / precedex)  4/7 Off vasopressors  4/11 Vasopressors restarted   4/13: Left IJ holiday  4/15: Right IJ placed   4/16: extubated, remained agitated and restless requiring IV Precedex infusion  4/18: Cortraktube insertion and removed 5/4  Antimicrobials:   Daptomycin 4/26>>(End 6/5)  Cefazolin 4/4 - 4/6, 4/28>>(End 5/4)  Metronidazole  4/27>>(End 5/4)  Cefepime 4/6 - 4/7  Ceftriaxone 3/31 - 3/31, 4/7 - 4/11, 4/14 - 4/15  Vancomycin 3/31 - 3/31, 4/14 - 4/24, 4/26 - 4/28  Zosyn 3/31 - 3/31  Subjective: Pain was severe when tilting up with PT the other day, moderate otherwise, located at the bottom of the spine, unchanged in character. Eating very well without issues. 5L intake charted in past 24 hours.  Objective: Vitals:   01/23/21 0957 01/23/21 1447 01/23/21 2200 01/24/21 0546  BP: 124/76 125/73 122/76 126/80  Pulse: (!) 108 99 96 99  Resp:  _0 Temp:  98.3 F (36.8 C) 99.5 F (37.5 C) 98.3 F (36.8 C)  TempSrc:   Oral Oral  SpO2:  94% 94% 92%  Weight:      Height:        Intake/Output Summary (Last 24 hours) at 01/24/2021 1144 Last data filed at 01/24/2021 0500 Gross per 24 hour  Intake 5092.46 ml  Output 2650 ml  Net 2442.46 ml   Filed Weights   01/03/21 0500 01/09/21 0500 01/19/21 0700  Weight: 120.6 kg 132 kg 118.7 kg   Gen: [age].o. male in no distress Pulm: Nonlabored breathing room air. Clear. CV: Regular rate and rhythm. No murmur, rub, or gallop. No JVD, no dependent edema. GI: Abdomen soft, non-tender, non-distended, with normoactive bowel sounds.  Ext: Warm, no deformities Skin: No new rashes, lesions or ulcers on visualized skin. Wound dressing is c/d/i. Neuro: Alert and oriented. No new focal neurological deficits. Stable LE weakness and dense LUE insensate paralysis. Psych: Judgement and insight appear fair. Mood euthymic & affect congruent. Behavior is appropriate.    Data Reviewed: I have personally reviewed following labs and imaging studies  CBC: Recent Labs  Lab 01/20/21 1048 01/21/21 0211 01/24/21 0356  WBC 7.0  6.6 7.4  NEUTROABS 4.5  --   --   HGB 9.5* 9.4* 8.9*  HCT 30.1* 30.4* 28.1*  MCV 98.0 99.3 95.9  PLT 273 264 008   Basic Metabolic Panel: Recent Labs  Lab 01/20/21 1048 01/21/21 0211 01/24/21 0356  NA 133* 138 134*  K 3.9 3.9 3.9  CL 95* 99 97*   CO2 30 32 29  GLUCOSE 154* 104* 95  BUN _0 CREATININE 0.60* 0.56* 0.53*  CALCIUM 8.5* 8.8* 8.8*   GFR: Estimated Creatinine Clearance: 137.8 mL/min (A) (by C-G formula based on SCr of 0.53 mg/dL (L)). Liver Function Tests: Recent Labs  Lab 01/20/21 1048  AST 17  ALT 12  ALKPHOS 77  BILITOT 0.3  PROT 6.5  ALBUMIN 2.0*   No results for input(s): LIPASE, AMYLASE in the last 168 hours. No results for input(s): AMMONIA in the last 168 hours. Coagulation Profile: No results for input(s): INR, PROTIME in the last 168 hours. Cardiac Enzymes: Recent Labs  Lab 01/19/21 1027  CKTOTAL 41*   BNP (last 3 results) No results for input(s): PROBNP in the last 8760 hours. HbA1C: No results for input(s): HGBA1C in the last 72 hours. CBG: Recent Labs  Lab 01/23/21 1209 01/23/21 1605 01/23/21 2136 01/24/21 0743 01/24/21 1136  GLUCAP 99 159* 119* 73 103*   Lipid Profile: No results for input(s): CHOL, HDL, LDLCALC, TRIG, CHOLHDL, LDLDIRECT in the last 72 hours. Thyroid Function Tests: No results for input(s): TSH, T4TOTAL, FREET4, T3FREE, THYROIDAB in the last 72 hours. Anemia Panel: No results for input(s): VITAMINB12, FOLATE, FERRITIN, TIBC, IRON, RETICCTPCT in the last 72 hours. Urine analysis:    Component Value Date/Time   COLORURINE YELLOW 01/20/2021 1010   APPEARANCEUR CLEAR 01/20/2021 1010   LABSPEC 1.005 01/20/2021 1010   PHURINE 6.0 01/20/2021 1010   GLUCOSEU NEGATIVE 01/20/2021 1010   HGBUR LARGE (A) 01/20/2021 1010   BILIRUBINUR NEGATIVE 01/20/2021 1010   KETONESUR NEGATIVE 01/20/2021 1010   PROTEINUR NEGATIVE 01/20/2021 1010   NITRITE NEGATIVE 01/20/2021 1010   LEUKOCYTESUR TRACE (A) 01/20/2021 1010   No results found for this or any previous visit (from the past 240 hour(s)).    Radiology Studies: No results found.  Scheduled Meds: . apixaban  5 mg Oral BID  . aspirin  81 mg Oral Daily  . atorvastatin  40 mg Oral Daily  . carvedilol  3.125 mg  Oral BID WC  . chlorhexidine  15 mL Mouth Rinse BID  . Chlorhexidine Gluconate Cloth  6 each Topical Daily  . collagenase   Topical Daily  . diclofenac Sodium  2 g Topical QID  . docusate  50 mg Oral Daily  . DULoxetine  30 mg Oral Daily  . feeding supplement  237 mL Oral TID BM  . insulin aspart  0-5 Units Subcutaneous QHS  . insulin aspart  0-9 Units Subcutaneous TID WC  . insulin detemir  35 Units Subcutaneous BID  . lidocaine  1 patch Transdermal Q24H  . mouth rinse  15 mL Mouth Rinse q12n4p  . methocarbamol  750 mg Oral TID AC & HS  . multivitamin with minerals  1 tablet Oral Daily  . oxyCODONE  10 mg Oral Q12H  . pantoprazole  40 mg Oral BID AC  . polyethylene glycol  17 g Oral Daily  . QUEtiapine  50 mg Oral QHS  . sodium chloride flush  10-40 mL Intracatheter Q12H  . vitamin B-12  100 mcg Oral Daily  .  zolpidem  10 mg Oral QHS   Continuous Infusions: . sodium chloride 50 mL (01/19/21 2154)  . DAPTOmycin (CUBICIN)  IV Stopped (01/24/21 0723)     LOS: 45 days   Time spent: 25 minutes.  Patrecia Pour, MD [facility] [email or web address] 01/24/2021, 11:44 AM

## 2021-01-25 ENCOUNTER — Encounter (HOSPITAL_COMMUNITY): Payer: Self-pay | Admitting: Pulmonary Disease

## 2021-01-25 LAB — GLUCOSE, CAPILLARY
Glucose-Capillary: 69 mg/dL — ABNORMAL LOW (ref 70–99)
Glucose-Capillary: 129 mg/dL — ABNORMAL HIGH (ref 70–99)
Glucose-Capillary: 115 mg/dL — ABNORMAL HIGH (ref 70–99)
Glucose-Capillary: 109 mg/dL — ABNORMAL HIGH (ref 70–99)

## 2021-01-25 NOTE — TOC Progression Note (Signed)
Transition of Care [facility]) - Progression Note    Patient Details  Name: [name] MRN: [id number] Date of Birth: Apr 08, 1961  Transition of Care [facility]) CM/SW Contact  Janae Bridgeman, RN Phone Number: 01/25/2021, 10:36 AM  Clinical Narrative:    Case management spoke with Georgeann Oppenheim, CM at [facility] on the phone to clarify patient's needs for SNF placement.  I spoke with Junious Silk, NP and patient will need continued IV Daptomycin through 02/02/2021.  I spoke with Georgeann Oppenheim and explained that the patient will most probably transition to po antibiotics after this date.  The patient no longer needs restraints, sitter and is alert and oriented x 4 and is able to verbalize needs to nursing staff.  The patient's FL2 was updated with current needs.  Kitty, CM will speak to the DON to clarify patient needs for SNF placement.  The patient refused COVID vaccine and Kitty at [facility] is aware.  CM and MSW will continue to follow for [facility] placement.   Expected Discharge Plan: Skilled Nursing Facility Barriers to Discharge: Continued Medical Work up,Inadequate or no insurance,SNF Pending payor source - LOG  Expected Discharge Plan and Services Expected Discharge Plan: Skilled Nursing Facility In-house Referral: Clinical Social Work Discharge Planning Services: CM Consult Post Acute Care Choice: Skilled Nursing Facility Living arrangements for the past 2 months: Single Family Home                                       Social Determinants of Health (SDOH) Interventions    Readmission Risk Interventions Readmission Risk Prevention Plan 01/11/2021  Transportation Screening Complete  PCP or Specialist Appt within 3-5 Days Complete  HRI or Home Care Consult Complete  Social Work Consult for Recovery Care Planning/Counseling Complete  Palliative Care Screening Complete  Medication Review [employer]) Complete

## 2021-01-25 NOTE — Progress Notes (Signed)
Physical Therapy Treatment Patient Details Name: [name] MRN: [id number] DOB: 12/26/1960 Today's Date: 01/25/2021    History of Present Illness 60 year old white male who presented to the emergency room from home 12/10/20.  Pt had 4-5 days of N/V/D. Pt became more confused and at times belligerent. Admitted for hypoxemic respiratory failure and septic shock 2/2 E. Coli bacteremia and UTI and was intubated. Extubated 4/16, now on cortrak. dense LUE paresis; CT head "late subacute to chronic infarct within the  posterior right frontal lobe"    PT Comments    Patient seen for verticalization therapy/tilting. Patient pre-medicated for back pain and had muscle relaxers with back pain tolerable during session. Continues to be limited by symptomatic orthostasis (BP 119/73 to 95/66) with BP dropping at 42 degrees of tilt.   Patient reports he has never tried a back brace for his low back pain. Will ask MD to consider lumbosacral orthosis which could assist with back pain and orthostasis. Will again ask for TED hose and/or use ace wraps for legs next session.     Follow Up Recommendations  SNF     Equipment Recommendations  None recommended by PT    Recommendations for Other Services       Precautions / Restrictions Precautions Precautions: Fall;Other (comment) Precaution Comments: foley catheter    Mobility  Bed Mobility                 Start Time: 1139 Angle: 42 degrees Total Minutes in Angle:  (total time in tilt=52 minutes; up to 42, back down to 20, back up to 32) Patient Response: Cooperative  Transfers                    Ambulation/Gait             General Gait Details: unable   Optometrist    Modified Rankin (Stroke Patients Only)       Balance                                            Cognition Arousal/Alertness: Awake/alert Behavior During Therapy: WFL for tasks  assessed/performed Overall Cognitive Status: Impaired/Different from baseline Area of Impairment: Memory                     Memory: Decreased short-term memory                Exercises Other Exercises Other Exercises: mini-squats 10 reps x 5 sets throughout verticalization    General Comments General comments (skin integrity, edema, etc.): BP again limiting verticalization more so than back pain (pt premedicated for pain and had muscle relaxers); 0 degrees BP 119/73, at 42 degrees dropped to 95/66 with pt symptomatic; returned to 20 degrees BP 108/71 and tolerated return to 32 degrees      Pertinent Vitals/Pain Pain Assessment: 0-10 Faces Pain Scale: Hurts even more Pain Location: back Pain Descriptors / Indicators: Sharp Pain Intervention(s): Premedicated before session;Monitored during session;Limited activity within patient's tolerance    Home Living                      Prior Function            PT Goals (current goals can now be found in the  care plan section) Acute Rehab PT Goals Patient Stated Goal: to tolerate being upright without BP dropping and with less pain Time For Goal Achievement: 02/02/21 Potential to Achieve Goals: Fair Progress towards PT goals: Progressing toward goals    Frequency    Min 2X/week      PT Plan Current plan remains appropriate    Co-evaluation              AM-PAC PT "6 Clicks" Mobility   Outcome Measure  Help needed turning from your back to your side while in a flat bed without using bedrails?: A Little Help needed moving from lying on your back to sitting on the side of a flat bed without using bedrails?: Total Help needed moving to and from a bed to a chair (including a wheelchair)?: Total Help needed standing up from a chair using your arms (e.g., wheelchair or bedside chair)?: Total Help needed to walk in hospital room?: Total Help needed climbing 3-5 steps with a railing? : Total 6 Click  Score: 8    End of Session   Activity Tolerance: Treatment limited secondary to medical complications (Comment) (orthostasis) Patient left: with call bell/phone within reach;in bed   PT Visit Diagnosis: Muscle weakness (generalized) (M62.81);Difficulty in walking, not elsewhere classified (R26.2);Other abnormalities of gait and mobility (R26.89)     Time: 0814-4818 PT Time Calculation (min) (ACUTE ONLY): 82 min  Charges:  $Therapeutic Activity: 68-82 mins                      [name], PT Pager [phone]    Zena Amos 01/25/2021, 2:16 PM

## 2021-01-25 NOTE — Progress Notes (Addendum)
[facility] PROGRESS NOTE  [name] [id number]:[id number] DOB: Oct 30, 1960 DOA: 12/10/2020 PCP: Pcp, No  Status: Remains inpatient appropriate because:Altered mental status, Unsafe d/c plan, IV treatments appropriate due to intensity of illness or inability to take PO and Inpatient level of care appropriate due to severity of illness   Dispo: The patient is from: Home              Anticipated d/c is to: SNF-as of 5/5 no bed offers              Patient currently is not medically stable to d/c.   Difficult to place patient Yes              Level of care: Med-Surg  Code Status: Full Family Communication: Patient, wife 5/12 DVT prophylaxis: Eliquis Vaccination status: Unvaccinated-CM spoke with wife who states that for political reasons patient has refused COVID vaccination.  I have had extensive conversations with him over the past week regarding COVID-vaccine and as of 5/16 he is refusing the vaccine.   HPI: 60 year old male with past medical history significant for essential hypertension, hyperlipidemia who [facility] ED on 3/31 for a 5-day history of nausea/vomiting/diarrhea and confusion.  Patient was initially admitted to [facility] service for acute hypoxic respiratory failure requiring intubation and ventilatory support with septic shock secondary to E. coli septicemia, UTI, MSSA Pneumonia, MRSE bacteremia (CLABSI involving left IJ vein and subclavian vein), with possible endovascular infection.  Infectious disease followed during hospital course.  Patient transferred from [facility] to [facility] on 01/01/2021 for further management of ongoing metabolic encephalopathy, tachycardia, dysphagia requiring tube feeds, intermittent fevers and prolonged antibiotic course.  Procedures/significant Hospital events:   Intubation 3/31  Triple-lumen central line placement 3/31  4/1 off pressors. E coli bacteremia, e coli uti. Some improvement in pulm opacities on CXR. On rocephin. TTE with LVEF  40-45%, LV global hypokinesis, G2DD, normal RV, dilated aortic root at 30m.  Bronchoscopy with BAL 4/5, Dr. STamala Julian 4/5 extubated, failed immediately due to mucous plugging identified on bronch after re-intubated. CT ABD with 440mleft ureteral stone with mild edema of left kideny, large areas of consolidation in bilateral lung bases.   4/6 urology consulted for distal left ureteral stone, to OR overnight for stent placement, UCx sent. Versed added for agitation (on fent / precedex)  4/7 Off vasopressors  4/11 Vasopressors restarted   4/13: Left IJ holiday  4/15: Right IJ placed   4/16: extubated, remained agitated and restless requiring IV Precedex infusion  4/18: Cortrak tube insertion and removed 5/4  Antimicrobials:   Daptomycin 4/26>> (End 6/5)  Cefazolin 4/4 - 4/6, 4/28>> (End 5/4)  Metronidazole 4/27>> (End 5/4)  Cefepime 4/6 - 4/7  Ceftriaxone 3/31 - 3/31, 4/7 - 4/11, 4/14 - 4/15  Vancomycin 3/31 - 3/31, 4/14 - 4/24, 4/26 - 4/28  Zosyn 3/31 - 3/31    Subjective: Alert.  No complaints.  States continues to have some pain with mobility and working with PT but no further dizziness.  Currently requesting to be removed from bedpan by nursing staff.  Objective: Vitals:   01/24/21 2200 01/25/21 0630  BP: 131/81 138/77  Pulse: 99 (!) 102  Resp: 18 19  Temp: 98.7 F (37.1 C) 98 F (36.7 C)  SpO2: 92% 95%    Intake/Output Summary (Last 24 hours) at 01/25/2021 0745 Last data filed at 01/25/2021 0600 Gross per 24 hour  Intake 10 ml  Output 3350 ml  Net -3340 ml  Filed Weights   01/03/21 0500 01/09/21 0500 01/19/21 0700  Weight: 120.6 kg 132 kg 118.7 kg    Exam:  Constitutional: Awake, calm, no acute distress Respiratory: Bilateral lung sounds are clear to auscultation anteriorly.  No increased work of breathing supine in bed.  Room air. Cardiovascular: Currently normotensive supine in bed.  No tachycardia.  Remedies warm and pink.  #13 while working  with therapy he did have some initial borderline orthostasis with blood pressure improving after 5 minutes. Abdomen:  LBM 5/10, soft nontender.  Bowel sounds present.  Eating well Genitourinary: Condom cath in place  Skin: Has stage III decubitus on hip Neurologic: Dense left upper extremity hemiparesis that is also insensate.  Moves all other extremities equally with strength 4/5. Psychiatric: And oriented x3 although continues to have some short-term memory deficits.  Pleasant.  Assessment/Plan: Acute problems: Acute hypoxic respiratory failure: Resolved MSSA pneumonia --Presented with hypoxia s/p mechanical ventilation due to sepsis physiology  MRSE endovascular infection/septicemia E. coli UTI/septicemia Septic shock, POA --Infectious disease following --Continue daptomycin 900 mg IV daily x 6 weeks from 4/24 (~02/14/21) --ID Dr. Montel Culver who recommended continuing IV antibiotics for 3 to 4 weeks before consideration of oral antibiotic  Acute hyponatremia with dehydration and associated recurrent orthostasis --Na+ normalized to 138 after IVFs --Patient had episode of orthostasis during PT session on 5/11 with borderline orthostasis on 5/13 -- Begin TED hose and consider abdominal binder-see below regarding lumbar sacral support  Back pain w/ activity -Continue low-dose OxyContin with Oxy IR for breakthrough pain and Robaxin as well as lidocaine patch  -- PT suspects orthostasis could be coming from back pain with mobilizing efforts therefore have ordered lumbar sacral support  Acute toxic metabolic encephalopathy/ongoing delirium/subacute vs chronic infarct with dense insensate LUE hemiparesis -Acute encephalopathy resolved --continue asa and statin -- Continue PT; KREG bed   Stage III right buttock decubitus -Initially started as a deep tissue injury and has progressed to decubitus --Flagyl x10 days with last dose 5/6 --Wound care RN following -continue Santyl --Currently  receiving PT hydrotherapy but if necessary to facilitate discharge can transition to once daily wound care  Type 2 diabetes mellitus --Home med: Glipizide --Hemoglobin A1c 6.8. --Lantus 35 u BID   Mild constipation --One-time dose of milk of magnesia effective; continue MiraLAX and Colace  Left IJ/subclavian DVT versus septic thrombus -Left IJ central line subsequently discontinued and RUE PICC line removed 4/24   --Repeat ultrasound duplex scan 4/25 with resolution of DVT. --Continue Lovenox therapeutic dose  Essential hypertension --Continue carvedilol   Recurrent pyelonephritis with obstructive hydronephrosis --s/p left ureteral stent 4/6.   --Repeat ultrasound with mild left hydronephrosis.  CT abdomen with stable position of double-J stent.  Dysphagia/mild protein calorie malnutrition/morbid obesity -- Continue regular diet --Estimated body mass index is 33.6 kg/m as calculated from the following:   Height as of this encounter: _0  (1.88 m).   Weight as of this encounter: 118.7 kg.  Weakness/deconditioning/debility: --Continue PT/OT  -Continue KREG tilt bed  -May need vaccine if required by rehabilitation facility he states he had COVID in [place] 5/10 positive  Goals of care: --Palliative care evaluated during hospitalization    Other problems: NASH cirrhosis:  Outpatient follow-up with gastroenterology  Mild hyponatremia --Resolved  Hypokalemia --Resolved  Acute renal failure -- Resolved  HLD:  -- Continue atorvastatin 40 mg daily  Atypical chest pain --Suspect GI etiology -cont. Protonix initiated and encourage upright after meals for at least 30 minutes  Data Reviewed: Basic Metabolic Panel: Recent Labs  Lab 01/20/21 1048 01/21/21 0211 01/24/21 0356  NA 133* 138 134*  K 3.9 3.9 3.9  CL 95* 99 97*  CO2 30 32 29  GLUCOSE 154* 104* 95  BUN _0 CREATININE 0.60* 0.56* 0.53*  CALCIUM 8.5* 8.8* 8.8*    CBC: Recent Labs  Lab 01/20/21 1048 01/21/21 0211 01/24/21 0356  WBC 7.0 6.6 7.4  NEUTROABS 4.5  --   --   HGB 9.5* 9.4* 8.9*  HCT 30.1* 30.4* 28.1*  MCV 98.0 99.3 95.9  PLT 273 264 278   Cardiac Enzymes: Recent Labs  Lab 01/19/21 1027  CKTOTAL 41*    CBG: Recent Labs  Lab 01/24/21 0743 01/24/21 1136 01/24/21 1607 01/24/21 2102 01/25/21 0737  GLUCAP 73 103* 115* 119* 69*     Studies: No results found.  Scheduled Meds: . apixaban  5 mg Oral BID  . aspirin  81 mg Oral Daily  . atorvastatin  40 mg Oral Daily  . carvedilol  3.125 mg Oral BID WC  . chlorhexidine  15 mL Mouth Rinse BID  . Chlorhexidine Gluconate Cloth  6 each Topical Daily  . collagenase   Topical Daily  . diclofenac Sodium  2 g Topical QID  . docusate  50 mg Oral Daily  . DULoxetine  30 mg Oral Daily  . feeding supplement  237 mL Oral TID BM  . insulin aspart  0-5 Units Subcutaneous QHS  . insulin aspart  0-9 Units Subcutaneous TID WC  . insulin detemir  35 Units Subcutaneous BID  . lidocaine  1 patch Transdermal Q24H  . mouth rinse  15 mL Mouth Rinse q12n4p  . methocarbamol  750 mg Oral TID AC & HS  . multivitamin with minerals  1 tablet Oral Daily  . oxyCODONE  10 mg Oral Q12H  . pantoprazole  40 mg Oral BID AC  . polyethylene glycol  17 g Oral Daily  . QUEtiapine  50 mg Oral QHS  . sodium chloride flush  10-40 mL Intracatheter Q12H  . vitamin B-12  100 mcg Oral Daily  . zolpidem  10 mg Oral QHS   Continuous Infusions: . sodium chloride 50 mL (01/19/21 2154)  . DAPTOmycin (CUBICIN)  IV 900 mg (01/24/21 2047)    Active Problems:   Acute respiratory failure (HCC)   AKI (acute kidney injury) ([place])   Bacteremia due to Escherichia coli   Ureteral stone   Pyelonephritis   Cerebral thrombosis with cerebral infarction   Staphylococcus epidermidis bacteremia   Pressure injury of skin   Dysphagia, pharyngoesophageal phase   Physical  deconditioning   Consultants:  PCCM  Urology Dr. Claudia Desanctis, Dr. Matilde Sprang  Nephrology, Dr. Jonnie Finner, Dr. Augustin Coupe  Neurology, Dr. Leonie Man  Infectious disease    Procedures:  See above    Time spent: 20 minutes    Erin Hearing ANP  [facility] 7 am - 330 pm/M-F for direct patient care and secure chat Please refer to [place] for contact info 46  days

## 2021-01-25 NOTE — NC FL2 (Signed)
[place] MEDICAID FL2 LEVEL OF CARE SCREENING TOOL     IDENTIFICATION  Patient Name: [name] Birthdate: 25-Mar-1961 Sex: male Admission Date (Current Location): 12/10/2020  [facility] and [place] Number:  Producer, television/film/video and Address:  [facility], [address], [place], [place] [address]      Provider Number: [id number]  Attending Physician Name and Address:  Tyrone Nine, MD  Relative Name and Phone Number:  [name] - wife - [phone]    Current Level of Care: Hospital Recommended Level of Care: Skilled Nursing Facility Prior Approval Number:    Date Approved/Denied: 01/11/21 PASRR Number: [id number]  Discharge Plan: SNF    Current Diagnoses: Patient Active Problem List   Diagnosis Date Noted  . Dysphagia, pharyngoesophageal phase   . Physical deconditioning   . Pressure injury of skin 01/04/2021  . Staphylococcus epidermidis bacteremia 12/25/2020  . 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 AKI (acute kidney injury) (HCC)   . Bacteremia due to Escherichia coli   . Ureteral stone   . Pyelonephritis   . Acute respiratory failure (HCC) 12/10/2020  . Sepsis with acute renal failure and septic shock (HCC)     Orientation RESPIRATION BLADDER Height & Weight     Self,Time,Situation,Place  Normal External catheter Weight: 118.7 kg Height:  6\' 2"  (188 cm)  BEHAVIORAL SYMPTOMS/MOOD NEUROLOGICAL BOWEL NUTRITION STATUS      Continent (assist patient with use of bedpan) Diet  AMBULATORY STATUS COMMUNICATION OF NEEDS Skin   Extensive Assist Verbally PU Stage and Appropriate Care (Hydrotherapy can be discontinued once acceptable bed offer at SNF and transition to dressing change.)     PU Stage 3 Dressing: Daily                 Personal Care Assistance Level of Assistance  Bathing,Feeding,Dressing Bathing Assistance: Maximum assistance Feeding assistance: Limited assistance Dressing Assistance: Maximum  assistance     Functional Limitations Info  Sight,Hearing,Speech Sight Info: Adequate Hearing Info: Adequate Speech Info: Adequate    SPECIAL CARE FACTORS FREQUENCY  PT (By licensed PT),OT (By licensed OT)     PT Frequency: 5x weekly OT Frequency: 5x weekly            Contractures Contractures Info: Not present    Additional Factors Info  Code Status,Allergies,Psychotropic,Insulin Sliding Scale Code Status Info: DNR Allergies Info: Ibuprofen, Metformin Psychotropic Info: Cymbalta, Seroquel, Ambien Insulin Sliding Scale Info: See discharge summary       Current Medications (01/25/2021):  This is the current hospital active medication list Current Facility-Administered Medications  Medication Dose Route Frequency Provider Last Rate Last Admin  . 0.9 %  sodium chloride infusion   Intravenous PRN 01/27/2021 B, NP 10 mL/hr at 01/19/21 2154 50 mL at 01/19/21 2154  . acetaminophen (TYLENOL) tablet 500 mg  500 mg Oral Q6H PRN 2155, MD   500 mg at 01/20/21 2011  . apixaban (ELIQUIS) tablet 5 mg  5 mg Oral BID Pham, Minh Q, RPH-CPP   5 mg at 01/25/21 0836  . aspirin chewable tablet 81 mg  81 mg Oral Daily Dahal, 01/27/21, MD   81 mg at 01/25/21 0836  . atorvastatin (LIPITOR) tablet 40 mg  40 mg Oral Daily Dahal, 01/27/21, MD   40 mg at 01/25/21 0836  . carvedilol (COREG) tablet 3.125 mg  3.125 mg Oral BID WC Kc, Ramesh, MD   3.125 mg at 01/25/21 0836  . chlorhexidine (PERIDEX) 0.12 %  solution 15 mL  15 mL Mouth Rinse BID Earney Hamburg B, NP   15 mL at 01/25/21 0836  . Chlorhexidine Gluconate Cloth 2 % PADS 6 each  6 each Topical Daily Earney Hamburg B, NP   6 each at 01/25/21 [phone]  . collagenase (SANTYL) ointment   Topical Daily Tyrone Nine, MD   Given at 01/25/21 [phone]  . DAPTOmycin (CUBICIN) 900 mg in sodium chloride 0.9 % IVPB  9 mg/kg (Adjusted) Intravenous Q2000 Ulyses Southward Q, RPH-CPP 136 mL/hr at 01/24/21 2047 900 mg at 01/24/21 2047  . diclofenac Sodium (VOLTAREN)  1 % topical gel 2 g  2 g Topical QID Russella Dar, NP   2 g at 01/25/21 0839  . docusate (COLACE) 50 MG/5ML liquid 50 mg  50 mg Oral Daily Dahal, Melina Schools, MD   50 mg at 01/25/21 0835  . DULoxetine (CYMBALTA) DR capsule 30 mg  30 mg Oral Daily Anderson Malta L, DO   30 mg at 01/25/21 0835  . feeding supplement (ENSURE ENLIVE / ENSURE PLUS) liquid 237 mL  237 mL Oral TID BM Reva Bores, MD   237 mL at 01/25/21 0839  . hydrALAZINE (APRESOLINE) injection 10 mg  10 mg Intravenous Q4H PRN Earney Hamburg B, NP   10 mg at 12/31/20 0338  . insulin aspart (novoLOG) injection 0-5 Units  0-5 Units Subcutaneous QHS Junious Silk L, NP      . insulin aspart (novoLOG) injection 0-9 Units  0-9 Units Subcutaneous TID WC Russella Dar, NP   2 Units at 01/23/21 1640  . insulin detemir (LEVEMIR) injection 35 Units  35 Units Subcutaneous BID Earney Hamburg B, NP   35 Units at 01/25/21 1016  . ipratropium-albuterol (DUONEB) 0.5-2.5 (3) MG/3ML nebulizer solution 3 mL  3 mL Nebulization Q4H PRN Kc, Ramesh, MD      . lidocaine (LIDODERM) 5 % 1 patch  1 patch Transdermal Q24H Russella Dar, NP   1 patch at 01/24/21 1242  . MEDLINE mouth rinse  15 mL Mouth Rinse q12n4p Earney Hamburg B, NP   15 mL at 01/23/21 1213  . methocarbamol (ROBAXIN) tablet 750 mg  750 mg Oral TID AC & HS Russella Dar, NP   750 mg at 01/25/21 0839  . multivitamin with minerals tablet 1 tablet  1 tablet Oral Daily Dahal, Melina Schools, MD   1 tablet at 01/25/21 0836  . ondansetron (ZOFRAN) injection 4 mg  4 mg Intravenous Q6H PRN Earney Hamburg B, NP   4 mg at 01/06/21 1012  . oxyCODONE (Oxy IR/ROXICODONE) immediate release tablet 10 mg  10 mg Oral Q4H PRN Tyrone Nine, MD   10 mg at 01/25/21 0631  . oxyCODONE (OXYCONTIN) 12 hr tablet 10 mg  10 mg Oral Q12H Russella Dar, NP   10 mg at 01/25/21 1015  . pantoprazole (PROTONIX) EC tablet 40 mg  40 mg Oral BID AC Russella Dar, NP   40 mg at 01/25/21 5400  . polyethylene glycol  (MIRALAX / GLYCOLAX) packet 17 g  17 g Oral Daily Dahal, Melina Schools, MD   17 g at 01/22/21 0811  . QUEtiapine (SEROQUEL) tablet 50 mg  50 mg Oral QHS Anderson Malta L, DO   50 mg at 01/24/21 2250  . Resource ThickenUp Clear   Oral PRN Earney Hamburg B, NP      . sodium chloride flush (NS) 0.9 % injection 10-40 mL  10-40 mL Intracatheter Q12H Earney Hamburg  B, NP   10 mL at 01/24/21 2253  . vitamin B-12 (CYANOCOBALAMIN) tablet 100 mcg  100 mcg Oral Daily Dahal, Binaya, MD   100 mcg at 01/25/21 1016  . zolpidem (AMBIEN) tablet 10 mg  10 mg Oral QHS Anderson Malta L, DO   10 mg at 01/24/21 2250     Discharge Medications: Please see discharge summary for a list of discharge medications.  Relevant Imaging Results:  Relevant Lab Results:   Additional Information SSN: [id number]  Janae Bridgeman, RN

## 2021-01-25 NOTE — Progress Notes (Signed)
Orthopedic Tech Progress Note Patient Details:  [name] Oct 19, 1960 [id number]  Ortho Devices Type of Ortho Device: Lumbar corsett Ortho Device/Splint Location: BACK Ortho Device/Splint Interventions: Ordered,Adjustment   Post Interventions Patient Tolerated: Well Instructions Provided: Care of device   [name] 01/25/2021, 5:40 PM

## 2021-01-26 LAB — GLUCOSE, CAPILLARY
Glucose-Capillary: 71 mg/dL (ref 70–99)
Glucose-Capillary: 119 mg/dL — ABNORMAL HIGH (ref 70–99)
Glucose-Capillary: 150 mg/dL — ABNORMAL HIGH (ref 70–99)
Glucose-Capillary: 100 mg/dL — ABNORMAL HIGH (ref 70–99)

## 2021-01-26 MED ORDER — INSULIN DETEMIR 100 UNIT/ML ~~LOC~~ SOLN
30.0000 [IU] | Freq: Two times a day (BID) | SUBCUTANEOUS | Status: DC
  Administered 2021-01-26: 30 [IU] via SUBCUTANEOUS
  Filled 2021-01-26 (×4): qty 0.3

## 2021-01-26 NOTE — Progress Notes (Addendum)
Physical Therapy Treatment Patient Details Name: [name] MRN: [id number] DOB: Jun 10, 1961 Today's Date: 01/26/2021    History of Present Illness 60 year old white male who presented to the emergency room from home 12/10/20.  Pt had 4-5 days of N/V/D. Pt became more confused and at times belligerent. Admitted for hypoxemic respiratory failure and septic shock 2/2 E. Coli bacteremia and UTI and was intubated. Extubated 4/16, now on cortrak. dense LUE paresis; CT head "late subacute to chronic infarct within the  posterior right frontal lobe"    PT Comments    Patient had received LSO but not yet received TED hose (RN made aware)--unit did not have ace wraps to wrap his legs. Utilized gradual elevation to chair position while wearing brace (donned in supine for both back pain and BP support). Patient's BP tolerated chair position up to 51 degrees and pt asymptomatic for dizziness. Pivoted to dangle at EOB and BP dropped with +symptoms which required return to sidelying. RN made aware pt reporting pain 8/10 despite pre-medication with pain med and muscle relaxer. Asked RN to please try to put pt in chair position later after he can have more pain medication.   BP 20 degrees 115/55 Sit EOB  99/74     Follow Up Recommendations  SNF     Equipment Recommendations  None recommended by PT    Recommendations for Other Services       Precautions / Restrictions Precautions Precautions: Fall;Other (comment) Precaution Comments: foley catheter    Mobility  Bed Mobility Overal bed mobility: Needs Assistance Bed Mobility: Rolling Rolling: Supervision Sidelying to sit: Min assist;+2 for safety/equipment (pt on air mattress)     Sit to sidelying: Min assist;+2 for safety/equipment General bed mobility comments: rolling rt and left with supervision to attend to LUE (hemiparesis);    Transfers                 General transfer comment: Pt unable to tolerate due to orthostasis and  back pain  Ambulation/Gait             General Gait Details: unable   Stairs             Wheelchair Mobility    Modified Rankin (Stroke Patients Only)       Balance Overall balance assessment: Needs assistance Sitting-balance support: Bilateral upper extremity supported;Feet supported Sitting balance-Leahy Scale: Fair Sitting balance - Comments: able to sit EOB with min guard assist due to air mattress and height of bed (Kregg bed)                                    Cognition Arousal/Alertness: Awake/alert Behavior During Therapy: WFL for tasks assessed/performed Overall Cognitive Status: Impaired/Different from baseline                                 General Comments: some delay in processing      Exercises      General Comments General comments (skin integrity, edema, etc.): Donned LSO in supine and pt felt it helped with back pain; HOB20 BP 115/55; HOB 32 in chair position 114/81; HOB 51 128/77; turn to dangle EOB with BP 103/72 and after 2 minutes 99/74; return to supine with BP 116/78      Pertinent Vitals/Pain Pain Assessment: 0-10 Faces Pain Scale: Hurts whole lot Pain Location: back Pain  Descriptors / Indicators: Sharp Pain Intervention(s): Premedicated before session;Monitored during session;Limited activity within patient's tolerance    Home Living                      Prior Function            PT Goals (current goals can now be found in the care plan section) Acute Rehab PT Goals Patient Stated Goal: to tolerate being upright without BP dropping and with less pain Time For Goal Achievement: 02/02/21 Potential to Achieve Goals: Fair Progress towards PT goals: Progressing toward goals (slow progress with upright)    Frequency    Min 3X/week (Kreg/tilt bed)      PT Plan Current plan remains appropriate    Co-evaluation              AM-PAC PT "6 Clicks" Mobility   Outcome Measure   Help needed turning from your back to your side while in a flat bed without using bedrails?: A Little Help needed moving from lying on your back to sitting on the side of a flat bed without using bedrails?: A Little Help needed moving to and from a bed to a chair (including a wheelchair)?: Total Help needed standing up from a chair using your arms (e.g., wheelchair or bedside chair)?: Total Help needed to walk in hospital room?: Total Help needed climbing 3-5 steps with a railing? : Total 6 Click Score: 10    End of Session   Activity Tolerance: Treatment limited secondary to medical complications (Comment) (orthostasis and back pain) Patient left: with call bell/phone within reach;in bed Nurse Communication: Mobility status;Patient requests pain meds PT Visit Diagnosis: Muscle weakness (generalized) (M62.81);Difficulty in walking, not elsewhere classified (R26.2);Other abnormalities of gait and mobility (R26.89)     Time: 2902-1115 PT Time Calculation (min) (ACUTE ONLY): 36 min  Charges:  $Therapeutic Activity: 23-37 mins                      Jerolyn Center, PT Pager [phone]    Zena Amos 01/26/2021, 12:34 PM

## 2021-01-26 NOTE — Progress Notes (Addendum)
[facility] PROGRESS NOTE  My Madariaga XNT:[id number] DOB: Oct 06, 1960 DOA: 12/10/2020 PCP: Pcp, No  Status: Remains inpatient appropriate because:Altered mental status, Unsafe d/c plan, IV treatments appropriate due to intensity of illness or inability to take PO and Inpatient level of care appropriate due to severity of illness   Dispo: The patient is from: Home              Anticipated d/c is to: SNF-as of 5/5 no bed offers              Patient currently is not medically stable to d/c.   Difficult to place patient Yes              Level of care: Med-Surg  Code Status: Full Family Communication: wife 5/12 DVT prophylaxis: Eliquis Vaccination status: Unvaccinated-CM spoke with wife who states that for political reasons patient has refused COVID vaccination.  I have had extensive conversations with him over the past week regarding COVID-vaccine and as of 5/16 he is refusing the vaccine.   HPI: 60 year old male with past medical history significant for essential hypertension, hyperlipidemia who presented to [facility] ED on 3/31 for a 5-day history of nausea/vomiting/diarrhea and confusion.  Patient was initially admitted to [facility] service for acute hypoxic respiratory failure requiring intubation and ventilatory support with septic shock secondary to E. coli septicemia, UTI, MSSA Pneumonia, MRSE bacteremia (CLABSI involving left IJ vein and subclavian vein), with possible endovascular infection.  Infectious disease followed during hospital course.  Patient transferred from [facility] to [facility] on 01/01/2021 for further management of ongoing metabolic encephalopathy, tachycardia, dysphagia requiring tube feeds, intermittent fevers and prolonged antibiotic course.  He is now reporting burning and tingling in LUE  Procedures/significant Hospital events:   Intubation 3/31  Triple-lumen central line placement 3/31  4/1 off pressors. E coli bacteremia, e coli uti. Some improvement in pulm  opacities on CXR. On rocephin. TTE with LVEF 40-45%, LV global hypokinesis, G2DD, normal RV, dilated aortic root at 80m.  Bronchoscopy with BAL 4/5, Dr. STamala Julian 4/5 extubated, failed immediately due to mucous plugging identified on bronch after re-intubated. CT ABD with 459mleft ureteral stone with mild edema of left kideny, large areas of consolidation in bilateral lung bases.   4/6 urology consulted for distal left ureteral stone, to OR overnight for stent placement, UCx sent. Versed added for agitation (on fent / precedex)  4/7 Off vasopressors  4/11 Vasopressors restarted   4/13: Left IJ holiday  4/15: Right IJ placed   4/16: extubated, remained agitated and restless requiring IV Precedex infusion  4/18: Cortrak tube insertion and removed 5/4  Antimicrobials:   Daptomycin 4/26>> (End 6/5)  Cefazolin 4/4 - 4/6, 4/28>> (End 5/4)  Metronidazole 4/27>> (End 5/4)  Cefepime 4/6 - 4/7  Ceftriaxone 3/31 - 3/31, 4/7 - 4/11, 4/14 - 4/15  Vancomycin 3/31 - 3/31, 4/14 - 4/24, 4/26 - 4/28  Zosyn 3/31 - 3/31    Subjective: Pleasantly confused this morning but easily reoriented.  No reports of back pain at rest.  Objective: Vitals:   01/25/21 1300 01/25/21 2102  BP: 127/73 127/84  Pulse:  100  Resp:  18  Temp:  98.6 F (37 C)  SpO2:  93%    Intake/Output Summary (Last 24 hours) at 01/26/2021 0739 Last data filed at 01/26/2021 0603 Gross per 24 hour  Intake 204 ml  Output 970 ml  Net -766 ml   Filed Weights   01/03/21 0500 01/09/21 0500 01/19/21 0700  Weight: 120.6 kg 132 kg 118.7 kg    Exam:  Constitutional: Supine in bed, pleasant, no acute distress Respiratory: Anterior lung sounds clear bilaterally, room air Cardiovascular: Normotensive while supine, no resting tachycardia, no peripheral edema, regular pulse Abdomen:  LBM 5/10, obese, soft nontender with normoactive bowel sounds Genitourinary: Condom cath in place  Skin: Stage III decubitus on  hip Neurologic: Dense left upper extremity hemiparesis that is also insensate.  Moves all other extremities equally with strength 4/5. Psychiatric: Alert.  Oriented x3 but did require some reorientation as to my role (thought bedside nurse for the day)  Assessment/Plan: Acute problems: Acute hypoxic respiratory failure: Resolved MSSA pneumonia --Presented with hypoxia s/p mechanical ventilation due to sepsis physiology  MRSE endovascular infection/septicemia E. coli UTI/septicemia Septic shock, POA --Infectious disease following --Continue daptomycin 900 mg IV daily x 6 weeks from 4/24 (~02/14/21) --ID Dr. Montel Culver who recommended continuing IV antibiotics for 3 to 4 weeks before consideration of oral antibiotic  Acute hyponatremia with dehydration and associated recurrent orthostasis Resolved --Recurrent orthostasis suspected possibly related to pain related vagal response --Continue TED hose and consider abdominal binder-see below regarding lumbar sacral support  Recent COVID infection -- Antibodies +5/20  Back pain w/ activity -Continue low-dose OxyContin with Oxy IR for breakthrough pain and Robaxin as well as lidocaine patch   Acute toxic metabolic encephalopathy/ongoing delirium/subacute vs chronic infarct with dense insensate LUE hemiparesis -Acute encephalopathy resolved --continue asa and statin -- Continue PT; KREG bed   Stage III right buttock decubitus -Initially started as a deep tissue injury and has progressed to decubitus --Flagyl x10 days with last dose 5/6 --Wound care RN following -continue Santyl --Currently receiving PT hydrotherapy but if necessary to facilitate discharge can transition to once daily wound care  Type 2 diabetes mellitus --Home med: Glipizide --Hemoglobin A1c 6.8. -- CBG is better controlled so we will decrease Lantus to 30 u BID   Mild constipation --One-time dose of milk of magnesia effective; continue MiraLAX and Colace  Left  IJ/subclavian DVT versus septic thrombus -Left IJ central line subsequently discontinued and RUE PICC line removed 4/24   --Repeat ultrasound duplex scan 4/25 with resolution of DVT. --Continue Lovenox therapeutic dose  Essential hypertension --Continue carvedilol   Recurrent pyelonephritis with obstructive hydronephrosis --s/p left ureteral stent 4/6.   --Repeat ultrasound with mild left hydronephrosis.  CT abdomen with stable position of double-J stent.  Dysphagia/mild protein calorie malnutrition/morbid obesity -- Continue regular diet --Estimated body mass index is 33.6 kg/m as calculated from the following:   Height as of this encounter: _0  (1.88 m).   Weight as of this encounter: 118.7 kg.  Weakness/deconditioning/debility: --Continue PT/OT  -Continue KREG tilt bed   Goals of care: --Palliative care evaluated during hospitalization    Other problems: NASH cirrhosis:  Outpatient follow-up with gastroenterology  Mild hyponatremia --Resolved  Hypokalemia --Resolved  Acute renal failure -- Resolved  HLD:  -- Continue atorvastatin 40 mg daily  Atypical chest pain --Suspect GI etiology -cont. Protonix initiated and encourage upright after meals for at least 30 minutes      Data Reviewed: Basic Metabolic Panel: Recent Labs  Lab 01/20/21 1048 01/21/21 0211 01/24/21 0356  NA 133* 138 134*  K 3.9 3.9 3.9  CL 95* 99 97*  CO2 30 32 29  GLUCOSE 154* 104* 95  BUN _1 CREATININE 0.60* 0.56* 0.53*  CALCIUM 8.5* 8.8* 8.8*   CBC: Recent Labs  Lab 01/20/21 1048 01/21/21 0211 01/24/21 0356  WBC 7.0 6.6 7.4  NEUTROABS 4.5  --   --   HGB 9.5* 9.4* 8.9*  HCT 30.1* 30.4* 28.1*  MCV 98.0 99.3 95.9  PLT 273 264 278   Cardiac Enzymes: Recent Labs  Lab 01/19/21 1027  CKTOTAL 41*    CBG: Recent Labs  Lab 01/25/21 0737 01/25/21 1206 01/25/21 1544 01/25/21 2104 01/26/21 0621  GLUCAP 69* 129* 115* 109* 71     Studies: No results  found.  Scheduled Meds: . apixaban  5 mg Oral BID  . aspirin  81 mg Oral Daily  . atorvastatin  40 mg Oral Daily  . carvedilol  3.125 mg Oral BID WC  . chlorhexidine  15 mL Mouth Rinse BID  . Chlorhexidine Gluconate Cloth  6 each Topical Daily  . collagenase   Topical Daily  . diclofenac Sodium  2 g Topical QID  . docusate  50 mg Oral Daily  . DULoxetine  30 mg Oral Daily  . feeding supplement  237 mL Oral TID BM  . insulin aspart  0-5 Units Subcutaneous QHS  . insulin aspart  0-9 Units Subcutaneous TID WC  . insulin detemir  35 Units Subcutaneous BID  . lidocaine  1 patch Transdermal Q24H  . mouth rinse  15 mL Mouth Rinse q12n4p  . methocarbamol  750 mg Oral TID AC & HS  . multivitamin with minerals  1 tablet Oral Daily  . oxyCODONE  10 mg Oral Q12H  . pantoprazole  40 mg Oral BID AC  . polyethylene glycol  17 g Oral Daily  . QUEtiapine  50 mg Oral QHS  . sodium chloride flush  10-40 mL Intracatheter Q12H  . vitamin B-12  100 mcg Oral Daily  . zolpidem  10 mg Oral QHS   Continuous Infusions: . sodium chloride 50 mL (01/19/21 2154)  . DAPTOmycin (CUBICIN)  IV Stopped (01/25/21 2147)    Active Problems:   Acute respiratory failure (HCC)   AKI (acute kidney injury) ([place])   Bacteremia due to Escherichia coli   Ureteral stone   Pyelonephritis   Cerebral thrombosis with cerebral infarction   Staphylococcus epidermidis bacteremia   Pressure injury of skin   Dysphagia, pharyngoesophageal phase   Physical deconditioning   Consultants:  PCCM  Urology Dr. Claudia Desanctis, Dr. Matilde Sprang  Nephrology, Dr. Jonnie Finner, Dr. Augustin Coupe  Neurology, Dr. Leonie Man  Infectious disease    Procedures:  See above    Time spent: 20 minutes    Erin Hearing ANP  [facility] 7 am - 330 pm/M-F for direct patient care and secure chat Please refer to Amion for contact info 47  days

## 2021-01-27 LAB — GLUCOSE, CAPILLARY
Glucose-Capillary: 66 mg/dL — ABNORMAL LOW (ref 70–99)
Glucose-Capillary: 66 mg/dL — ABNORMAL LOW (ref 70–99)
Glucose-Capillary: 89 mg/dL (ref 70–99)
Glucose-Capillary: 125 mg/dL — ABNORMAL HIGH (ref 70–99)
Glucose-Capillary: 114 mg/dL — ABNORMAL HIGH (ref 70–99)
Glucose-Capillary: 92 mg/dL (ref 70–99)
Glucose-Capillary: 148 mg/dL — ABNORMAL HIGH (ref 70–99)

## 2021-01-27 LAB — CK: Total CK: 76 U/L (ref 49–397)

## 2021-01-27 MED ORDER — OXYCODONE HCL ER 10 MG PO T12A
20.0000 mg | EXTENDED_RELEASE_TABLET | Freq: Two times a day (BID) | ORAL | Status: DC
  Administered 2021-01-27 – 2021-02-02 (×12): 20 mg via ORAL
  Filled 2021-01-27 (×12): qty 2

## 2021-01-27 MED ORDER — INSULIN DETEMIR 100 UNIT/ML ~~LOC~~ SOLN
20.0000 [IU] | Freq: Two times a day (BID) | SUBCUTANEOUS | Status: DC
  Administered 2021-01-27 (×2): 20 [IU] via SUBCUTANEOUS
  Filled 2021-01-27 (×4): qty 0.2

## 2021-01-27 NOTE — Progress Notes (Signed)
CSW spoke with [name] at [facility] who states that the updated clinical information was given to DON at [facility] but due to the patient still requiring IV daptomycin, his referral is still under review.  Edwin Dada, MSW, LCSW Transitions of Care  Clinical Social Worker II [phone]

## 2021-01-27 NOTE — Progress Notes (Signed)
Pt blood sugar 66 this am. Pt given juice and graham crackers. Recheck cbg 89 . Will continue to monitor pt.

## 2021-01-27 NOTE — Progress Notes (Signed)
Occupational Therapy Treatment Patient Details Name: [name] MRN: [id number] DOB: [birth year] Today's Date: 01/27/2021    History of present illness 60 year old white male who presented to the emergency room from home 12/10/20.  Pt had 4-5 days of N/V/D. Pt became more confused and at times belligerent. Admitted for hypoxemic respiratory failure and septic shock 2/2 E. Coli bacteremia and UTI and was intubated. Extubated 4/16, now on cortrak. dense LUE paresis; CT head "late subacute to chronic infarct within the  posterior right frontal lobe"   OT comments  Focused on use of Kreg bed into chair position for upright tolerance, initial drop in BP from 125/76 to 92/67 but then progressed slowly from 20* to 65* with pt maintaining BP in 120-130/90s and asymptomatic.  Demonstrated L UE shoulder elevation, FF, and IR/ER during session, educated on positioning and protection of UE.  Reports mild shoulder pain and subluxation noted--may benefit from taping of shoulder.  Pt also reports increased tone in hand overnight "fingers curled in"--will monitor.  Will follow acutely.    Follow Up Recommendations  SNF    Equipment Recommendations  None recommended by OT    Recommendations for Other Services      Precautions / Restrictions Precautions Precautions: Fall;Other (comment) Precaution Comments: foley catheter       Mobility Bed Mobility Overal bed mobility: Needs Assistance Bed Mobility: Rolling Rolling: Supervision         General bed mobility comments: rolling to L and R to place LSO for pain mgmt.  Chair position in bed tolerated minimally due to back pain.    Transfers                 General transfer comment: Pt unable to tolerate due to back pain    Balance                                           ADL either performed or assessed with clinical judgement   ADL Overall ADL's : Needs assistance/impaired     Grooming: Moderate  assistance;Wash/dry hands;Sitting Grooming Details (indicate cue type and reason): chair postion in bed                                     Vision       Perception     Praxis      Cognition Arousal/Alertness: Awake/alert Behavior During Therapy: WFL for tasks assessed/performed Overall Cognitive Status: Impaired/Different from baseline Area of Impairment: Memory                     Memory: Decreased short-term memory         General Comments: engages appropriately, some delay with processing and easily distracted        Exercises Exercises: Other exercises Other Exercises Other Exercises: L UE shoulder FF, IR/ER, elevation/depression x 10 reps; PROM distally   Shoulder Instructions       General Comments Donned LSO in supine for pain mgmt focused on transition with HOB elevated into chair position for ADL engagement-- HOB0 125/76, HOB 36 92/67; decreased HOB and gradully increased from 20 to 65* with BP stable today  (120s-130s/90s) but limited by back pain    Pertinent Vitals/ Pain       Pain Assessment: 0-10 Faces Pain  Scale: Hurts whole lot Pain Location: back Pain Descriptors / Indicators: Sharp Pain Intervention(s): Limited activity within patient's tolerance;Monitored during session;Repositioned  Home Living                                          Prior Functioning/Environment              Frequency  Min 2X/week        Progress Toward Goals  OT Goals(current goals can now be found in the care plan section)  Progress towards OT goals: Progressing toward goals  Acute Rehab OT Goals Patient Stated Goal: to tolerate being upright without BP dropping and with less pain  Plan Discharge plan remains appropriate    Co-evaluation                 AM-PAC OT "6 Clicks" Daily Activity     Outcome Measure   Help from another person eating meals?: A Little Help from another person taking care of  personal grooming?: A Little Help from another person toileting, which includes using toliet, bedpan, or urinal?: Total Help from another person bathing (including washing, rinsing, drying)?: A Lot Help from another person to put on and taking off regular upper body clothing?: A Lot Help from another person to put on and taking off regular lower body clothing?: Total 6 Click Score: 12    End of Session    OT Visit Diagnosis: Unsteadiness on feet (R26.81) Hemiplegia - Right/Left: Left Hemiplegia - dominant/non-dominant: Non-Dominant Hemiplegia - caused by: Cerebral infarction Pain - part of body:  (back)   Activity Tolerance Patient limited by pain   Patient Left in bed;with call bell/phone within reach   Nurse Communication Mobility status;Patient requests pain meds        Time: 6546-5035 OT Time Calculation (min): 47 min  Charges: OT General Charges $OT Visit: 1 Visit OT Treatments $Therapeutic Activity: 23-37 mins $Neuromuscular Re-education: 8-22 mins  Barry Brunner, OT Acute Rehabilitation Services Pager [phone] Office [phone]    Chancy Milroy 01/27/2021, 4:23 PM

## 2021-01-27 NOTE — Progress Notes (Signed)
[facility] PROGRESS NOTE  [name] RWE:[id number] DOB: 03-Mar-1961 DOA: 12/10/2020 PCP: Pcp, No  Status: Remains inpatient appropriate because:Altered mental status, Unsafe d/c plan, IV treatments appropriate due to intensity of illness or inability to take PO and Inpatient level of care appropriate due to severity of illness   Dispo: The patient is from: Home              Anticipated d/c is to: SNF-as of 5/5 no bed offers              Patient currently is not medically stable to d/c.   Difficult to place patient Yes              Level of care: Med-Surg  Code Status: Full Family Communication: wife 5/12 DVT prophylaxis: Eliquis Vaccination status: Unvaccinated-CM spoke with wife who states that for political reasons patient has refused COVID vaccination.  I have had extensive conversations with him over the past week regarding COVID-vaccine and as of 5/16 he is refusing the vaccine.   HPI: 60 year old male with past medical history significant for essential hypertension, hyperlipidemia who presented to [facility] ED on 3/31 for a 5-day history of nausea/vomiting/diarrhea and confusion.  Patient was initially admitted to [facility] service for acute hypoxic respiratory failure requiring intubation and ventilatory support with septic shock secondary to E. coli septicemia, UTI, MSSA Pneumonia, MRSE bacteremia (CLABSI involving left IJ vein and subclavian vein), with possible endovascular infection.  Infectious disease followed during hospital course.  Patient transferred from [facility] to [facility] on 01/01/2021 for further management of ongoing metabolic encephalopathy, tachycardia, dysphagia requiring tube feeds, intermittent fevers and prolonged antibiotic course.  He is now reporting burning and tingling in LUE  Procedures/significant Hospital events:   Intubation 3/31  Triple-lumen central line placement 3/31  4/1 off pressors. E coli bacteremia, e coli uti. Some improvement in pulm  opacities on CXR. On rocephin. TTE with LVEF 40-45%, LV global hypokinesis, G2DD, normal RV, dilated aortic root at 61m.  Bronchoscopy with BAL 4/5, Dr. STamala Julian 4/5 extubated, failed immediately due to mucous plugging identified on bronch after re-intubated. CT ABD with 419mleft ureteral stone with mild edema of left kideny, large areas of consolidation in bilateral lung bases.   4/6 urology consulted for distal left ureteral stone, to OR overnight for stent placement, UCx sent. Versed added for agitation (on fent / precedex)  4/7 Off vasopressors  4/11 Vasopressors restarted   4/13: Left IJ holiday  4/15: Right IJ placed   4/16: extubated, remained agitated and restless requiring IV Precedex infusion  4/18: Cortrak tube insertion and removed 5/4  Antimicrobials:   Daptomycin 4/26>> (End 6/5)  Cefazolin 4/4 - 4/6, 4/28>> (End 5/4)  Metronidazole 4/27>> (End 5/4)  Cefepime 4/6 - 4/7  Ceftriaxone 3/31 - 3/31, 4/7 - 4/11, 4/14 - 4/15  Vancomycin 3/31 - 3/31, 4/14 - 4/24, 4/26 - 4/28  Zosyn 3/31 - 3/31    Subjective: Awake.  Reports having inadequate pain control with physical activity.  Back brace did help some.  Objective: Vitals:   01/27/21 0300 01/27/21 0735  BP: 128/75 (!) 119/58  Pulse: (!) 110 (!) 118  Resp: 16   Temp: 98.9 F (37.2 C)   SpO2: 95% 95%    Intake/Output Summary (Last 24 hours) at 01/27/2021 0752 Last data filed at 01/27/2021 0650 Gross per 24 hour  Intake 68 ml  Output 2200 ml  Net -2132 ml   Filed Weights   01/03/21 0500 01/09/21  0500 01/19/21 0700  Weight: 120.6 kg 132 kg 118.7 kg    Exam:  Constitutional: Alert, awake.  Watching TV.  No acute distress and no pain while supine Respiratory: Bilateral lung sounds are clear to auscultation.  Room air. Cardiovascular: Normal heart sounds, no peripheral edema, regular pulse Abdomen:  LBM 5/10, soft nontender nondistended with normoactive bowel sounds.  Excellent  appetite Genitourinary: Condom cath in place light amber-colored urine noted Skin: Stage III decubitus on hip Neurologic: Dense left upper extremity hemiparesis that is also insensate.  Moves all other extremities equally with strength 4/5. Psychiatric: Alert and oriented x3.  Was not confused today.  Assessment/Plan: Acute problems: Acute hypoxic respiratory failure: Resolved MSSA pneumonia --Presented with hypoxia s/p mechanical ventilation due to sepsis physiology  MRSE endovascular infection/septicemia E. coli UTI/septicemia Septic shock, POA --Infectious disease following --Continue daptomycin 900 mg IV daily x 6 weeks from 4/24 (~02/14/21) --ID Dr. Montel Culver who recommended continuing IV antibiotics for 3 to 4 weeks before consideration of oral antibiotic  Acute hyponatremia with dehydration and associated recurrent orthostasis Resolved --Recurrent orthostasis suspected possibly related to pain related vagal response --Continue TED hose and consider abdominal binder-see below regarding lumbar sacral support  Recent COVID infection -- Antibodies positive 5/20  Back pain w/ activity -Increase OxyContin to 20 mg BID -Continue Oxy IR for breakthrough pain and Robaxin as well as lidocaine patch   Acute toxic metabolic encephalopathy/ongoing delirium/subacute vs chronic infarct with dense insensate LUE hemiparesis -Acute encephalopathy resolved --continue asa and statin -- Continue PT; KREG bed   Stage III right buttock decubitus -Initially started as a deep tissue injury and has progressed to decubitus --Flagyl x10 days with last dose 5/6 --Wound care RN following -continue Santyl --Currently receiving PT hydrotherapy but if necessary to facilitate discharge can transition to once daily wound care  Type 2 diabetes mellitus --Home med: Glipizide --Hemoglobin A1c 6.8. -- CBG is better controlled so we will decrease Lantus to 30 u BID   Mild constipation --One-time dose  of milk of magnesia effective; continue MiraLAX and Colace  Left IJ/subclavian DVT versus septic thrombus -Left IJ central line subsequently discontinued and RUE PICC line removed 4/24   --Repeat ultrasound duplex scan 4/25 with resolution of DVT. --Continue Lovenox therapeutic dose  Essential hypertension --Continue carvedilol   Recurrent pyelonephritis with obstructive hydronephrosis --s/p left ureteral stent 4/6.   --Repeat ultrasound with mild left hydronephrosis.  CT abdomen with stable position of double-J stent.  Dysphagia/mild protein calorie malnutrition/morbid obesity -- Continue regular diet --Estimated body mass index is 33.6 kg/m as calculated from the following:   Height as of this encounter: _0  (1.88 m).   Weight as of this encounter: 118.7 kg.  Weakness/deconditioning/debility: --Continue PT/OT  -Continue KREG tilt bed   Goals of care: --Palliative care evaluated during hospitalization    Other problems: NASH cirrhosis:  Outpatient follow-up with gastroenterology  Mild hyponatremia --Resolved  Hypokalemia --Resolved  Acute renal failure -- Resolved  HLD:  -- Continue atorvastatin 40 mg daily  Atypical chest pain --Suspect GI etiology -cont. Protonix initiated and encourage upright after meals for at least 30 minutes      Data Reviewed: Basic Metabolic Panel: Recent Labs  Lab 01/20/21 1048 01/21/21 0211 01/24/21 0356  NA 133* 138 134*  K 3.9 3.9 3.9  CL 95* 99 97*  CO2 30 32 29  GLUCOSE 154* 104* 95  BUN _1 CREATININE 0.60* 0.56* 0.53*  CALCIUM 8.5* 8.8* 8.8*  CBC: Recent Labs  Lab 01/20/21 1048 01/21/21 0211 01/24/21 0356  WBC 7.0 6.6 7.4  NEUTROABS 4.5  --   --   HGB 9.5* 9.4* 8.9*  HCT 30.1* 30.4* 28.1*  MCV 98.0 99.3 95.9  PLT 273 264 278   Cardiac Enzymes: Recent Labs  Lab 01/27/21 0457  CKTOTAL 76    CBG: Recent Labs  Lab 01/26/21 2008 01/27/21 0636 01/27/21 0648 01/27/21 0655  01/27/21 0744  GLUCAP 100* 66* 66* 89 125*     Studies: No results found.  Scheduled Meds: . apixaban  5 mg Oral BID  . aspirin  81 mg Oral Daily  . atorvastatin  40 mg Oral Daily  . carvedilol  3.125 mg Oral BID WC  . chlorhexidine  15 mL Mouth Rinse BID  . Chlorhexidine Gluconate Cloth  6 each Topical Daily  . collagenase   Topical Daily  . diclofenac Sodium  2 g Topical QID  . docusate  50 mg Oral Daily  . DULoxetine  30 mg Oral Daily  . feeding supplement  237 mL Oral TID BM  . insulin aspart  0-5 Units Subcutaneous QHS  . insulin aspart  0-9 Units Subcutaneous TID WC  . insulin detemir  20 Units Subcutaneous BID  . lidocaine  1 patch Transdermal Q24H  . mouth rinse  15 mL Mouth Rinse q12n4p  . methocarbamol  750 mg Oral TID AC & HS  . multivitamin with minerals  1 tablet Oral Daily  . oxyCODONE  10 mg Oral Q12H  . pantoprazole  40 mg Oral BID AC  . polyethylene glycol  17 g Oral Daily  . QUEtiapine  50 mg Oral QHS  . sodium chloride flush  10-40 mL Intracatheter Q12H  . vitamin B-12  100 mcg Oral Daily  . zolpidem  10 mg Oral QHS   Continuous Infusions: . sodium chloride 50 mL (01/19/21 2154)  . DAPTOmycin (CUBICIN)  IV Stopped (01/26/21 2119)    Active Problems:   Acute respiratory failure (HCC)   AKI (acute kidney injury) ([place])   Bacteremia due to Escherichia coli   Ureteral stone   Pyelonephritis   Cerebral thrombosis with cerebral infarction   Staphylococcus epidermidis bacteremia   Pressure injury of skin   Dysphagia, pharyngoesophageal phase   Physical deconditioning   Consultants:  PCCM  Urology Dr. Claudia Desanctis, Dr. Matilde Sprang  Nephrology, Dr. Jonnie Finner, Dr. Augustin Coupe  Neurology, Dr. Leonie Man  Infectious disease    Procedures:  See above    Time spent: 20 minutes    Erin Hearing ANP  [facility] 7 am - 330 pm/M-F for direct patient care and secure chat Please refer to Amion for contact info 48  days

## 2021-01-27 NOTE — Progress Notes (Signed)
Inpatient Diabetes Program Recommendations  AACE/ADA: New Consensus Statement on Inpatient Glycemic Control (2015)  Target Ranges:  Prepandial:   less than 140 mg/dL      Peak postprandial:   less than 180 mg/dL (1-2 hours)      Critically ill patients:  140 - 180 mg/dL   Lab Results  Component Value Date   GLUCAP 125 (H) 01/27/2021   HGBA1C 6.8 (H) 12/21/2020    Review of Glycemic Control Results for [name], [name] (MRN [id number]) as of 01/27/2021 11:01  Ref. Range 01/26/2021 06:21 01/26/2021 11:50 01/26/2021 15:32 01/26/2021 20:08 01/27/2021 06:36 01/27/2021 06:48 01/27/2021 06:55 01/27/2021 07:44  Glucose-Capillary Latest Ref Range: 70 - 99 mg/dL 71 846 (H) 659 (H) 935 (H) 66 (L) 66 (L) 89 125 (H)   Diabetes history: DM 2 Outpatient Diabetes medications: Glipizide 10 mg Daily Current orders for Inpatient glycemic control:  Levemir 20 units bid Novolog 0-9 units tid + hs  Ensure tid between meals  Inpatient Diabetes Program Recommendations:    Hypoglycemia with fasting glucose at 66.  -  Reduce Levemir to 16 units bid  Thanks,  Christena Deem RN, MSN, BC-ADM Inpatient Diabetes Coordinator Team Pager [phone] (8a-5p)

## 2021-01-28 LAB — GLUCOSE, CAPILLARY
Glucose-Capillary: 123 mg/dL — ABNORMAL HIGH (ref 70–99)
Glucose-Capillary: 97 mg/dL (ref 70–99)
Glucose-Capillary: 99 mg/dL (ref 70–99)
Glucose-Capillary: 113 mg/dL — ABNORMAL HIGH (ref 70–99)
Glucose-Capillary: 129 mg/dL — ABNORMAL HIGH (ref 70–99)

## 2021-01-28 MED ORDER — ENSURE ENLIVE PO LIQD
237.0000 mL | Freq: Two times a day (BID) | ORAL | Status: DC
  Administered 2021-01-28 – 2021-02-02 (×6): 237 mL via ORAL
  Filled 2021-01-28 (×2): qty 237

## 2021-01-28 MED ORDER — INSULIN DETEMIR 100 UNIT/ML ~~LOC~~ SOLN
16.0000 [IU] | Freq: Two times a day (BID) | SUBCUTANEOUS | Status: DC
  Administered 2021-01-28 – 2021-02-02 (×11): 16 [IU] via SUBCUTANEOUS
  Filled 2021-01-28 (×12): qty 0.16

## 2021-01-28 MED ORDER — METHOCARBAMOL 500 MG PO TABS
1000.0000 mg | ORAL_TABLET | Freq: Three times a day (TID) | ORAL | Status: DC
  Administered 2021-01-28 – 2021-02-02 (×22): 1000 mg via ORAL
  Filled 2021-01-28 (×22): qty 2

## 2021-01-28 MED ORDER — JUVEN PO PACK
1.0000 | PACK | Freq: Two times a day (BID) | ORAL | Status: DC
  Administered 2021-01-28 – 2021-02-02 (×6): 1 via ORAL
  Filled 2021-01-28 (×7): qty 1

## 2021-01-28 NOTE — Progress Notes (Signed)
Nutrition Follow-up  DOCUMENTATION CODES:  Obesity unspecified  INTERVENTION:  -1 packet Juven BID, each packet provides 95 calories, 2.5 grams of protein (collagen), and 9.8 grams of carbohydrate (3 grams sugar); also contains 7 grams of L-arginine and L-glutamine, 300 mg vitamin C, 15 mg vitamin E, 1.2 mcg vitamin B-12, 9.5 mg zinc, 200 mg calcium, and 1.5 g  Calcium Beta-hydroxy-Beta-methylbutyrate to support wound healing -Ensure Enlive po BID, each supplement provides 350 kcal and 20 grams of protein -Continue Magic cup TID with meals, each supplement provides 290 kcal and 9 grams of protein -Continue MVI with minerals daily -Continue room service with assistance  No BM documented since 5/10. Recommend initiation of bowel regimen if this is accurate.   NUTRITION DIAGNOSIS:  Increased nutrient needs related to wound healing as evidenced by estimated needs. - ongoing  GOAL:  Patient will meet greater than or equal to 90% of their needs - progressing  MONITOR:  TF tolerance,Labs,Diet advancement,PO intake  REASON FOR ASSESSMENT:  Ventilator,Consult Enteral/tube feeding initiation and management  ASSESSMENT:  60 yo male admitted with acute metabolic encephalopathy, acute respiratory failure, septic shock, E coli bacteremia, E coli UTI. PMH includes recent severe diarrhea and vomiting, chronic pain, HTN, HLD.  3/31 - intubated 4/5 - extubated then immediately reintubated d/t secretions  4/6 - s/p ureteral stent placement 4/16 - extubated 4/18 - cortrak placed (gastric tip) 4/21 - dysphagia 2 diet with honey thick liquids ordered s/p BSE 4/22 - tx to [facility] 4/24 - blood cultures w/staph epidermidis bacteremia/gram-negative rods bacteremia(ID following), PICC removed; off O2; diet downgraded to dysphagia 1 with honey thick liquids s/p BSE 4/25 - PCCM signed off 5/1 - diet advanced to dysphagia 2 with nectar thick liquids 5/3 - diet advanced to dysphagia 3 with nectar thick  liquids  5/4 - cortrak removed 5/5 - diet advanced to regular with thin liquids  Pt reports no back pain at rest, but does c/o poor pain control with physical activity, though noted back brace did help some. Pt continues to be awaiting bed offers from SNF.   Only 1 meal documented since last RD assessment (documented as 100% completion). Discussed pt with RN who reports pt doing well with meals and supplements. Pt endorses good appetite and is still enjoying supplements. Given good po intake and pressure wound on buttocks, will begin to provide pt with Juven to aid in healing. Will reduce Ensure to BID so that pt has less volume to consume throughout the day.   UOP: documented x24 hours  No BM documented since 5/10. Recommend initiation of bowel regimen if this is accurate.   Medications: colace, SSI TID w/ meals and at bedtime, 16 units levemir BID, mvi with minerals, protonix, miralax, vitamin B12 Labs reviewed.  CBGs 97-123  Diet Order:   Diet Order            Diet regular Room service appropriate? Yes with Assist; Fluid consistency: Thin  Diet effective now                EDUCATION NEEDS:  Not appropriate for education at this time  Skin:  Skin Assessment: Skin Integrity Issues: Skin Integrity Issues:: Stage IV Stage IV: R buttocks  Last BM:  5/10 type 4  Height:  Ht Readings from Last 1 Encounters:  12/25/20 6\' 2"  (1.88 m)   Weight:  Wt Readings from Last 1 Encounters:  01/19/21 118.7 kg   Ideal Body Weight:  86.4 kg  BMI:  Body  mass index is 33.6 kg/m.  Estimated Nutritional Needs:  Kcal:  2160-2590 Protein:  173-216 gm Fluid:  >/= 2 L   Eugene Gavia, MS, RD, LDN RD pager number and weekend/on-call pager number located in Amion.

## 2021-01-28 NOTE — Progress Notes (Signed)
Physical Therapy Treatment Patient Details Name: [name] MRN: [id number] DOB: November 08, 1960 Today's Date: 01/28/2021    History of Present Illness 60 year old white male who presented to the emergency room from home 12/10/20.  Pt had 4-5 days of N/V/D. Pt became more confused and at times belligerent. Admitted for hypoxemic respiratory failure and septic shock 2/2 E. Coli bacteremia and UTI and was intubated. Extubated 4/16, now on cortrak. dense LUE paresis; CT head "late subacute to chronic infarct within the  posterior right frontal lobe"    PT Comments    Patient with overall better tolerance of tilting/verticalization this date. Still no TED hose (asked RN to order and by end of session they arrived), but did use back brace for support.  BP from 0 degrees to 30 degrees stable as increasing in 10 degree increments (see BPs below). On increase to 40 degrees, BP dropped briefly and then recovered after 4 minutes maintaining 40 degrees tilt. Ultimately tolerated increasing to 52 degrees but began to feel orthostatic and palor in face/lips noted. Could not get an accurate BP at 52 degrees and returned pt slowly to supine.   Orthostatic BPs HOB 0 degrees 119/80  HOB 12 degrees 118/70  HOB 23 degrees 117/71  HOB 30 degrees 120/74  HOB 40 degrees 103/75 slightly dizzy           after 4 min 112/76  HOB 47 degrees 100/81          After 4 minutes 117/86  HOB 52 degrees 138/126 158/130 very dizzy and returned to 0 slowly  HOB 10 degrees  118/72      Follow Up Recommendations  SNF     Equipment Recommendations  None recommended by PT    Recommendations for Other Services       Precautions / Restrictions Precautions Precautions: Fall;Other (comment) Precaution Comments: foley catheter Required Braces or Orthoses: Spinal Brace Spinal Brace: Lumbar corset;Applied in supine position (for back pain and for orthostasis management) Other Brace: TED hose for  orthostasis Restrictions Weight Bearing Restrictions: No    Mobility  Bed Mobility Overal bed mobility: Needs Assistance Bed Mobility: Rolling Rolling: Supervision         General bed mobility comments: rolling rt and left with supervision to attend to LUE (hemiparesis); Start Time: 1026 Angle: 52 degrees Total Minutes in Angle: 46 minutes (total time elevating from 0 to 52; moving in 5 degree increments due to orthostasis)  Transfers                 General transfer comment: via tilt bed  Ambulation/Gait             General Gait Details: unable   Stairs             Wheelchair Mobility    Modified Rankin (Stroke Patients Only)       Balance                                            Cognition Arousal/Alertness: Awake/alert Behavior During Therapy: WFL for tasks assessed/performed Overall Cognitive Status: Impaired/Different from baseline                                 General Comments: some delay in processing; continues to confuse PT for his NP  Exercises General Exercises - Lower Extremity Mini-Sqauts: 10 reps (x 1 set; once more upright calves too tight for squats)    General Comments        Pertinent Vitals/Pain Pain Assessment: 0-10 Pain Score: 7  Pain Location: back, bil calves in standing Pain Descriptors / Indicators: Sharp Pain Intervention(s): Limited activity within patient's tolerance;Monitored during session;Premedicated before session;RN gave pain meds during session;Patient requesting pain meds-RN notified    Home Living                      Prior Function            PT Goals (current goals can now be found in the care plan section) Acute Rehab PT Goals Patient Stated Goal: to tolerate being upright without BP dropping and with less pain Time For Goal Achievement: 02/02/21 Potential to Achieve Goals: Fair Progress towards PT goals: Progressing toward goals     Frequency    Min 3X/week (Kreg/tilt bed)      PT Plan Current plan remains appropriate    Co-evaluation              AM-PAC PT "6 Clicks" Mobility   Outcome Measure  Help needed turning from your back to your side while in a flat bed without using bedrails?: A Little Help needed moving from lying on your back to sitting on the side of a flat bed without using bedrails?: A Little Help needed moving to and from a bed to a chair (including a wheelchair)?: Total Help needed standing up from a chair using your arms (e.g., wheelchair or bedside chair)?: Total Help needed to walk in hospital room?: Total Help needed climbing 3-5 steps with a railing? : Total 6 Click Score: 10    End of Session   Activity Tolerance: Treatment limited secondary to medical complications (Comment) (orthostasis and back pain) Patient left: with call bell/phone within reach;in bed;with bed alarm set Nurse Communication: Mobility status;Patient requests pain meds;Other (comment) (please obtain TED hose and ony wears during therapy/tilting) PT Visit Diagnosis: Muscle weakness (generalized) (M62.81);Difficulty in walking, not elsewhere classified (R26.2);Other abnormalities of gait and mobility (R26.89)     Time: 7494-4967 PT Time Calculation (min) (ACUTE ONLY): 66 min  Charges:  $Therapeutic Activity: 53-67 mins                      Jerolyn Center, PT Pager [phone]    Zena Amos 01/28/2021, 12:09 PM

## 2021-01-28 NOTE — TOC Progression Note (Signed)
Transition of Care [facility]) - Progression Note    Patient Details  Name: [name] MRN: [id number] Date of Birth: Feb 16, 1961  Transition of [facility] CM/SW [place], RN Phone Number: 01/28/2021, 12:29 PM  Clinical Narrative:    Case management met with the patient at the bedside regarding transitions of care.  The patient is being follow by [facility] for possible placement at the facility.  At this time barrier to placement includes cost of Daptomycin IV antibiotic via PICC line infusions.    The patient was encouraged to receive COVID vaccine since this also presents a barrier to SNF placement but patient continues to decline at this time.  PT is continuing to work with the patient for therapy to improve physical deconditioning after his illness and admission to the hospital.  Patient continues to need hydrotherapy; however, this may be transitioned to wet to dry dressing changes if SNF facility is able to offer a bed for admission to the patient.   Expected Discharge Plan: Skilled Nursing Facility Barriers to Discharge: Continued Medical Work up,Inadequate or no insurance,SNF Pending payor source - LOG  Expected Discharge Plan and Services Expected Discharge Plan: [place] In-house Referral: Clinical Social Work Discharge Planning Services: CM Consult Post Acute Care Choice: [place] arrangements for the past 2 months: Single Family Home                                       Social Determinants of Health (SDOH) Interventions    Readmission Risk Interventions Readmission Risk Prevention Plan 01/11/2021  Transportation Screening Complete  PCP or Specialist Appt within 3-5 Days Complete  HRI or [place] Complete  Social Work Consult for [place] Planning/Counseling Complete  Palliative Care Screening Complete  Medication Review [employer]) Complete

## 2021-01-28 NOTE — Progress Notes (Addendum)
[facility] PROGRESS NOTE  Laurier Jasperson DVV:[id number] DOB: [birth year] DOA: 12/10/2020 PCP: Pcp, No  Status: Remains inpatient appropriate because:Altered mental status, Unsafe d/c plan, IV treatments appropriate due to intensity of illness or inability to take PO and Inpatient level of care appropriate due to severity of illness   Dispo: The patient is from: Home              Anticipated d/c is to: SNF-as of 5/5 no bed offers-likely will have more bed offers if we can transition from IV daptomycin to alternative agent at the end of 4 weeks of antibiotics              Patient currently is not medically stable to d/c.   Difficult to place patient Yes              Level of care: Med-Surg  Code Status: Full Family Communication: wife 5/12 DVT prophylaxis: Eliquis Vaccination status: Unvaccinated-CM spoke with wife who states that for political reasons patient has refused COVID vaccination.  I have had extensive conversations with him over the past week regarding COVID-vaccine and as of 5/16 he is refusing the vaccine.   HPI: 60 year old male with past medical history significant for essential hypertension, hyperlipidemia who presented to [facility] ED on 3/31 for a 5-day history of nausea/vomiting/diarrhea and confusion.  Patient was initially admitted to [facility] service for acute hypoxic respiratory failure requiring intubation and ventilatory support with septic shock secondary to E. coli septicemia, UTI, MSSA Pneumonia, MRSE bacteremia (CLABSI involving left IJ vein and subclavian vein), with possible endovascular infection.  Infectious disease followed during hospital course.  Patient transferred from [facility] to [facility] on 01/01/2021 for further management of ongoing metabolic encephalopathy, tachycardia, dysphagia requiring tube feeds, intermittent fevers and prolonged antibiotic course.  He is now reporting burning and tingling in LUE  Procedures/significant Hospital events:   Intubation  3/31  Triple-lumen central line placement 3/31  4/1 off pressors. E coli bacteremia, e coli uti. Some improvement in pulm opacities on CXR. On rocephin. TTE with LVEF 40-45%, LV global hypokinesis, G2DD, normal RV, dilated aortic root at 45m.  Bronchoscopy with BAL 4/5, Dr. STamala Julian 4/5 extubated, failed immediately due to mucous plugging identified on bronch after re-intubated. CT ABD with 414mleft ureteral stone with mild edema of left kideny, large areas of consolidation in bilateral lung bases.   4/6 urology consulted for distal left ureteral stone, to OR overnight for stent placement, UCx sent. Versed added for agitation (on fent / precedex)  4/7 Off vasopressors  4/11 Vasopressors restarted   4/13: Left IJ holiday  4/15: Right IJ placed   4/16: extubated, remained agitated and restless requiring IV Precedex infusion  4/18: Cortrak tube insertion and removed 5/4  Antimicrobials:   Daptomycin 4/26>> (End 6/5)  Cefazolin 4/4 - 4/6, 4/28>> (End 5/4)  Metronidazole 4/27>> (End 5/4)  Cefepime 4/6 - 4/7  Ceftriaxone 3/31 - 3/31, 4/7 - 4/11, 4/14 - 4/15  Vancomycin 3/31 - 3/31, 4/14 - 4/24, 4/26 - 4/28  Zosyn 3/31 - 3/31    Subjective: Patient alert and jocular.  States he feels muscle relaxer needs to be increased.  States pain better controlled with increase in OxyContin yesterday.  Objective: Vitals:   01/27/21 1922 01/28/21 0453  BP: 117/76 121/68  Pulse: 100 98  Resp: 18 18  Temp: 98.8 F (37.1 C) 98.5 F (36.9 C)  SpO2: 94% 94%    Intake/Output Summary (Last 24 hours) at 01/28/2021 0825  Last data filed at 01/28/2021 0600 Gross per 24 hour  Intake 68 ml  Output 380 ml  Net -312 ml   Filed Weights   01/03/21 0500 01/09/21 0500 01/19/21 0700  Weight: 120.6 kg 132 kg 118.7 kg    Exam:  Constitutional:, Calm, appears to be comfortable resting in the bed.  Therapy at bedside. Respiratory: Lungs are clear.  Stable on room air.  Supine in bed without  increased work of breathing. Cardiovascular: S1-S2, no peripheral edema.  Normotensive. Abdomen:  LBM 5/10, soft nondistended nontender and tolerating oral diet.  Normoactive bowel sounds. Genitourinary: Condom cath in place light amber-colored urine noted Skin: Stage III decubitus on hip Neurologic: Dense left upper extremity hemiparesis that is also insensate.  Moves all other extremities equally with strength 4/5. Psychiatric: Alert and oriented x3.  Has occasional transient confusion in regards to recognizing consistent personnel.  Assessment/Plan: Acute problems: Acute hypoxic respiratory failure: Resolved MSSA pneumonia --Presented with hypoxia s/p mechanical ventilation due to sepsis physiology  MRSE endovascular infection/septicemia E. coli UTI/septicemia Septic shock, POA --Infectious disease following --Continue daptomycin 900 mg IV daily x 6 weeks from 4/24 (~02/14/21) --ID Dr. Montel Culver who recommended continuing IV antibiotics for 3 to 4 weeks before consideration of oral antibiotic  Acute hyponatremia with dehydration and associated recurrent orthostasis Resolved --Recurrent orthostasis suspected possibly related to pain related vagal response --Continue TED hose and consider abdominal binder-see below regarding lumbar sacral support  Recent COVID infection -- Antibodies positive 5/20  Back pain w/ activity -Continue OxyContin to 20 mg BID -Continue Oxy IR for breakthrough pain and increase Robaxin to 1000 mg as well as lidocaine patch   Acute toxic metabolic encephalopathy/ongoing delirium/subacute vs chronic infarct with dense insensate LUE hemiparesis -Acute encephalopathy resolved --continue asa and statin -- Continue PT; KREG tilt bed   Stage III right buttock decubitus -Initially started as a deep tissue injury and has progressed to decubitus --Completed 10 days of Flagyl --Wound care RN following -continue Santyl and hydrotherapy  Type 2 diabetes  mellitus --Home med: Glipizide --Hemoglobin A1c 6.8. -- Decrease Lantus to 16 units twice daily  Mild constipation --One-time dose of milk of magnesia effective; continue MiraLAX and Colace  Left IJ/subclavian DVT versus septic thrombus -Left IJ central line subsequently discontinued and RUE PICC line removed 4/24   --Repeat ultrasound duplex scan 4/25 with resolution of DVT. --Continue Lovenox therapeutic dose  Essential hypertension --Continue carvedilol   Recurrent pyelonephritis with obstructive hydronephrosis --s/p left ureteral stent 4/6.   --Repeat ultrasound with mild left hydronephrosis.  CT abdomen with stable position of double-J stent.  Dysphagia/mild protein calorie malnutrition/morbid obesity -- Continue regular diet --Estimated body mass index is 33.6 kg/m as calculated from the following:   Height as of this encounter: _0  (1.88 m).   Weight as of this encounter: 118.7 kg.  Weakness/deconditioning/debility: --Continue PT/OT  -Continue KREG tilt bed   Goals of care: --Palliative care evaluated during hospitalization    Other problems: NASH cirrhosis:  Outpatient follow-up with gastroenterology  Mild hyponatremia --Resolved  Hypokalemia --Resolved  Acute renal failure -- Resolved  HLD:  -- Continue atorvastatin 40 mg daily  Atypical chest pain --Suspect GI etiology -cont. Protonix initiated and encourage upright after meals for at least 30 minutes      Data Reviewed: Basic Metabolic Panel: Recent Labs  Lab 01/24/21 0356  NA 134*  K 3.9  CL 97*  CO2 29  GLUCOSE 95  BUN 11  CREATININE 0.53*  CALCIUM  8.8*   CBC: Recent Labs  Lab 01/24/21 0356  WBC 7.4  HGB 8.9*  HCT 28.1*  MCV 95.9  PLT 278   Cardiac Enzymes: Recent Labs  Lab 01/27/21 0457  CKTOTAL 76    CBG: Recent Labs  Lab 01/27/21 1142 01/27/21 1644 01/27/21 2000 01/28/21 0608 01/28/21 0757  GLUCAP 114* 92 148* 123* 97     Studies: No results  found.  Scheduled Meds: . apixaban  5 mg Oral BID  . aspirin  81 mg Oral Daily  . atorvastatin  40 mg Oral Daily  . carvedilol  3.125 mg Oral BID WC  . chlorhexidine  15 mL Mouth Rinse BID  . Chlorhexidine Gluconate Cloth  6 each Topical Daily  . collagenase   Topical Daily  . diclofenac Sodium  2 g Topical QID  . docusate  50 mg Oral Daily  . DULoxetine  30 mg Oral Daily  . feeding supplement  237 mL Oral TID BM  . insulin aspart  0-5 Units Subcutaneous QHS  . insulin aspart  0-9 Units Subcutaneous TID WC  . insulin detemir  16 Units Subcutaneous BID  . lidocaine  1 patch Transdermal Q24H  . mouth rinse  15 mL Mouth Rinse q12n4p  . methocarbamol  750 mg Oral TID AC & HS  . multivitamin with minerals  1 tablet Oral Daily  . oxyCODONE  20 mg Oral Q12H  . pantoprazole  40 mg Oral BID AC  . polyethylene glycol  17 g Oral Daily  . QUEtiapine  50 mg Oral QHS  . sodium chloride flush  10-40 mL Intracatheter Q12H  . vitamin B-12  100 mcg Oral Daily  . zolpidem  10 mg Oral QHS   Continuous Infusions: . sodium chloride 50 mL (01/19/21 2154)  . DAPTOmycin (CUBICIN)  IV Stopped (01/27/21 2100)    Active Problems:   Acute respiratory failure (HCC)   AKI (acute kidney injury) ([place])   Bacteremia due to Escherichia coli   Ureteral stone   Pyelonephritis   Cerebral thrombosis with cerebral infarction   Staphylococcus epidermidis bacteremia   Pressure injury of skin   Dysphagia, pharyngoesophageal phase   Physical deconditioning   Consultants:  PCCM  Urology Dr. Claudia Desanctis, Dr. Matilde Sprang  Nephrology, Dr. Jonnie Finner, Dr. Augustin Coupe  Neurology, Dr. Leonie Man  Infectious disease    Procedures:  See above    Time spent: 20 minutes    Erin Hearing ANP  [facility] 7 am - 330 pm/M-F for direct patient care and secure chat Please refer to [place] for contact info 49  days

## 2021-01-28 NOTE — Plan of Care (Signed)
  Problem: Education: Goal: Knowledge of General Education information will improve Description: Including pain rating scale, medication(s)/side effects and non-pharmacologic comfort measures Outcome: Progressing   Problem: Health Behavior/Discharge Planning: Goal: Ability to manage health-related needs will improve Outcome: Progressing   Problem: Clinical Measurements: Goal: Ability to maintain clinical measurements within normal limits will improve Outcome: Progressing Goal: Will remain free from infection Outcome: Progressing Goal: Diagnostic test results will improve Outcome: Progressing Goal: Respiratory complications will improve Outcome: Progressing Goal: Cardiovascular complication will be avoided Outcome: Progressing   Problem: Activity: Goal: Risk for activity intolerance will decrease Outcome: Progressing   Problem: Nutrition: Goal: Adequate nutrition will be maintained Outcome: Progressing   Problem: Coping: Goal: Level of anxiety will decrease Outcome: Progressing   Problem: Elimination: Goal: Will not experience complications related to bowel motility Outcome: Progressing Goal: Will not experience complications related to urinary retention Outcome: Progressing   Problem: Pain Managment: Goal: General experience of comfort will improve Outcome: Progressing   Problem: Safety: Goal: Ability to remain free from injury will improve Outcome: Progressing   Problem: Skin Integrity: Goal: Risk for impaired skin integrity will decrease Outcome: Progressing   Problem: Safety: Goal: Non-violent Restraint(s) Outcome: Progressing   Problem: Education: Goal: Knowledge of disease or condition will improve Outcome: Progressing Goal: Knowledge of secondary prevention will improve Outcome: Progressing Goal: Knowledge of patient specific risk factors addressed and post discharge goals established will improve Outcome: Progressing   Problem: Self-Care: Goal:  Verbalization of feelings and concerns over difficulty with self-care will improve Outcome: Progressing   Problem: Nutrition: Goal: Dietary intake will improve Outcome: Progressing   

## 2021-01-28 NOTE — Consult Note (Signed)
WOC Nurse wound follow up Patient receiving care in [facility] 2W23. Wound type: Stage 4 PI to right buttock Measurement: 8 cm x 5.2 cm x 4 cm Wound bed: 100% pink Drainage (amount, consistency, odor) no drainage, no odor Periwound: intact Dressing procedure/placement/frequency: Santyl discontinued as the wound is 100% clean. Therapy changed to: Place saline moistened gauze into the wound on the right buttock. Be sure to tuck it into the undermining around the wound. Cover with ABD pads, tape in place. Helmut Muster, RN, MSN, CWOCN, CNS-BC, pager [phone]

## 2021-01-29 LAB — GLUCOSE, CAPILLARY
Glucose-Capillary: 112 mg/dL — ABNORMAL HIGH (ref 70–99)
Glucose-Capillary: 102 mg/dL — ABNORMAL HIGH (ref 70–99)
Glucose-Capillary: 138 mg/dL — ABNORMAL HIGH (ref 70–99)
Glucose-Capillary: 131 mg/dL — ABNORMAL HIGH (ref 70–99)

## 2021-01-29 NOTE — Progress Notes (Signed)
CSW spoke with Rasheema at [facility] - sent clinicals for review.  Edwin Dada, MSW, LCSW Transitions of Care  Clinical Social Worker II [phone]

## 2021-01-29 NOTE — Progress Notes (Signed)
Occupational Therapy Treatment Patient Details Name: [name] MRN: [id number] DOB: 1960/10/29 Today's Date: 01/29/2021    History of present illness 60 year old white male who presented to the emergency room from home 12/10/20.  Pt had 4-5 days of N/V/D. Pt became more confused and at times belligerent. Admitted for hypoxemic respiratory failure and septic shock 2/2 E. Coli bacteremia and UTI and was intubated. Extubated 4/16, now on cortrak. dense LUE paresis; CT head "late subacute to chronic infarct within the  posterior right frontal lobe"   OT comments  Pt was progressively tilted at 10* increments at slow pace.  He was ultimately able to tolerate 55* for 4 mins before BP dropped from initial 129/87 to 115/68 with c/o dizziness and tunnel vision.  He was returned to supine with BP rebounding to 137/91.   While tilted worked on facilitation of Lt UE reach and function.     Follow Up Recommendations  SNF    Equipment Recommendations  None recommended by OT    Recommendations for Other Services      Precautions / Restrictions Precautions Precautions: Fall;Other (comment) Required Braces or Orthoses: Spinal Brace Spinal Brace: Lumbar corset;Applied in supine position (for back pain and orthstasis management) Other Brace: TED hose for orthostasis       Mobility Bed Mobility Overal bed mobility: Needs Assistance Bed Mobility: Rolling Rolling: Supervision              Transfers                      Balance                                           ADL either performed or assessed with clinical judgement   ADL                                               Vision       Perception     Praxis      Cognition Arousal/Alertness: Awake/alert Behavior During Therapy: WFL for tasks assessed/performed Overall Cognitive Status: Impaired/Different from baseline Area of Impairment: Memory                      Memory: Decreased short-term memory         General Comments: Pt asking today why we are using the tilt bed although this has been explained to him multiple times previously        Exercises Other Exercises Other Exercises: Pt tolerated a slow progressive tiltof 10* increments  to max tilt of  55* for 4 mins with BP dropping from initial 129/87 to 115/68 with c/o dizziness and was returned to supine wiht BP 137/91 after ~4 mins supine Other Exercises: while tilted worked on facillitation of reach with Lt UE.  He was able to achieve ~55* flexion with min facilitation.  He performed 5 reps scap adduction and depression   Shoulder Instructions       General Comments donned LSO in supine    Pertinent Vitals/ Pain       Pain Assessment: 0-10 Pain Score: 5  Pain Location: back, bil calves in standing Pain Descriptors / Indicators: Sharp Pain Intervention(s): Monitored during session;Repositioned  Home Living                                          Prior Functioning/Environment              Frequency  Min 2X/week        Progress Toward Goals  OT Goals(current goals can now be found in the care plan section)  Progress towards OT goals: Progressing toward goals (slowly)     Plan Discharge plan remains appropriate    Co-evaluation                 AM-PAC OT "6 Clicks" Daily Activity     Outcome Measure   Help from another person eating meals?: A Little Help from another person taking care of personal grooming?: A Little Help from another person toileting, which includes using toliet, bedpan, or urinal?: Total Help from another person bathing (including washing, rinsing, drying)?: A Lot Help from another person to put on and taking off regular upper body clothing?: A Lot Help from another person to put on and taking off regular lower body clothing?: Total 6 Click Score: 12    End of Session    OT Visit Diagnosis: Unsteadiness on feet  (R26.81) Hemiplegia - Right/Left: Left Hemiplegia - dominant/non-dominant: Non-Dominant Hemiplegia - caused by: Cerebral infarction   Activity Tolerance Treatment limited secondary to medical complications (Comment) (orthostasis)   Patient Left in bed;with call bell/phone within reach   Nurse Communication Mobility status        Time: 3903-0092 OT Time Calculation (min): 1433 min  Charges: OT General Charges $OT Visit: 1 Visit OT Treatments $Therapeutic Activity: 23-37 mins $Neuromuscular Re-education: 8-22 mins  Eber Jones., OTR/L Acute Rehabilitation Services Pager [phone] Office [phone]    Jeani Hawking M 01/29/2021, 3:41 PM

## 2021-01-29 NOTE — Progress Notes (Addendum)
[facility] PROGRESS NOTE  [name] JSE:[id number] DOB: 1960-10-06 DOA: 12/10/2020 PCP: Pcp, No  Status: Remains inpatient appropriate because:Altered mental status, Unsafe d/c plan, IV treatments appropriate due to intensity of illness or inability to take PO and Inpatient level of care appropriate due to severity of illness   Dispo: The patient is from: Home              Anticipated d/c is to: SNF-as of 5/5 no bed offers-likely will have more bed offers if we can transition from IV daptomycin to alternative agent at the end of 4 weeks of antibiotics              Patient currently is not medically stable to d/c.   Difficult to place patient Yes              Level of care: Med-Surg  Code Status: Full Family Communication: wife 5/20 DVT prophylaxis: Eliquis Vaccination status: Unvaccinated-CM spoke with wife who states that for political reasons patient has refused COVID vaccination.  I have had extensive conversations with him over the past week regarding COVID-vaccine and as of 5/16 he is refusing the vaccine.   HPI: 60 year old male with past medical history significant for essential hypertension, hyperlipidemia who presented to [facility] ED on 3/31 for a 5-day history of nausea/vomiting/diarrhea and confusion.  Patient was initially admitted to [facility] service for acute hypoxic respiratory failure requiring intubation and ventilatory support with septic shock secondary to E. coli septicemia, UTI, MSSA Pneumonia, MRSE bacteremia (CLABSI involving left IJ vein and subclavian vein), with possible endovascular infection.  Infectious disease followed during hospital course.  Patient transferred from [facility] to [facility] on 01/01/2021 for further management of ongoing metabolic encephalopathy, tachycardia, dysphagia requiring tube feeds, intermittent fevers and prolonged antibiotic course.  He is now reporting burning and tingling in LUE  Procedures/significant Hospital events:   Intubation  3/31  Triple-lumen central line placement 3/31  4/1 off pressors. E coli bacteremia, e coli uti. Some improvement in pulm opacities on CXR. On rocephin. TTE with LVEF 40-45%, LV global hypokinesis, G2DD, normal RV, dilated aortic root at 2m.  Bronchoscopy with BAL 4/5, Dr. STamala Julian 4/5 extubated, failed immediately due to mucous plugging identified on bronch after re-intubated. CT ABD with 463mleft ureteral stone with mild edema of left kideny, large areas of consolidation in bilateral lung bases.   4/6 urology consulted for distal left ureteral stone, to OR overnight for stent placement, UCx sent. Versed added for agitation (on fent / precedex)  4/7 Off vasopressors  4/11 Vasopressors restarted   4/13: Left IJ holiday  4/15: Right IJ placed   4/16: extubated, remained agitated and restless requiring IV Precedex infusion  4/18: Cortrak tube insertion and removed 5/4  Antimicrobials:   Daptomycin 4/26>> (End 6/5)  Cefazolin 4/4 - 4/6, 4/28>> (End 5/4)  Metronidazole 4/27>> (End 5/4)  Cefepime 4/6 - 4/7  Ceftriaxone 3/31 - 3/31, 4/7 - 4/11, 4/14 - 4/15  Vancomycin 3/31 - 3/31, 4/14 - 4/24, 4/26 - 4/28  Zosyn 3/31 - 3/31    Subjective: Alert, mildly confused.  Does report that pain has improved with increase in Robaxin.  Objective: Vitals:   01/28/21 2005 01/29/21 0300  BP: 117/78 128/65  Pulse: 100 88  Resp: 18 19  Temp: 97.8 F (36.6 C) 98.1 F (36.7 C)  SpO2: 94% 95%    Intake/Output Summary (Last 24 hours) at 01/29/2021 0741 Last data filed at 01/29/2021 [id number] per 24 hour  Intake --  Output 1050 ml  Net -1050 ml   Filed Weights   01/09/21 0500 01/19/21 0700 01/29/21 0610  Weight: 132 kg 118.7 kg 120.1 kg    Exam:  Constitutional: Awake, calm, appears to be comfortable per self-report Respiratory: Lungs are clear anteriorly, flat in bed without any increased work of breathing, room air Cardiovascular:  Abdomen:  LBM 5/18, soft nontender  nondistended with normoactive bowel sounds.  Eating well Genitourinary: Condom cath in place light amber-colored urine noted Skin: Stage III decubitus on hip Neurologic:LUE hemiparesis persists with insensate component although patient now reporting some tingling and sharp sensation in the arm.  I also witnessed patient moving arm grossly from shoulder which is new..  Moves all other extremities equally with strength 4/5. Psychiatric: Alert, oriented x3  Assessment/Plan: Acute problems: Acute hypoxic respiratory failure: Resolved MSSA pneumonia --Presented with hypoxia s/p mechanical ventilation due to sepsis physiology  MRSE endovascular infection/septicemia E. coli UTI/septicemia Septic shock, POA --Infectious disease following --Continue daptomycin 900 mg IV daily x 6 weeks from 4/24 (~02/14/21) --ID Dr. Montel Culver who recommended continuing IV antibiotics for 3 to 4 weeks before consideration of oral antibiotic  Acute hyponatremia with dehydration and associated recurrent orthostasis Resolved --Recurrent orthostasis suspected possibly related to pain related vagal response --Continue TED hose and consider abdominal binder-see below regarding lumbar sacral support  Recent COVID infection -- Antibodies positive 5/20  Back pain w/ activity -Continue OxyContin to 20 mg BID -Continue Oxy IR for breakthrough pain, lidocaine patch and Robaxin   Acute toxic metabolic encephalopathy/ongoing delirium/subacute vs chronic infarct with dense insensate LUE hemiparesis -Acute encephalopathy resolved --continue asa and statin  Stage III right buttock decubitus -Initially started as a deep tissue injury and has progressed to decubitus --Completed 10 days of Flagyl --Wound care RN following -continue Santyl and hydrotherapy  Type 2 diabetes mellitus --Home med: Glipizide --Hemoglobin A1c 6.8. -- Decrease Lantus to 16 units twice daily  Mild constipation --One-time dose of milk of  magnesia effective; continue MiraLAX and Colace  Left IJ/subclavian DVT versus septic thrombus -Left IJ central line subsequently discontinued and RUE PICC line removed 4/24   --Repeat ultrasound duplex scan 4/25 with resolution of DVT. --Continue Lovenox therapeutic dose  Essential hypertension --Continue carvedilol   Recurrent pyelonephritis with obstructive hydronephrosis --s/p left ureteral stent 4/6.   --Repeat ultrasound with mild left hydronephrosis.  CT abdomen with stable position of double-J stent.  Dysphagia/mild protein calorie malnutrition/morbid obesity -- Continue regular diet --Estimated body mass index is 33.99 kg/m as calculated from the following:   Height as of this encounter: _0  (1.88 m).   Weight as of this encounter: 120.1 kg.  Weakness/deconditioning/debility: --Continue PT/OT  -Continue KREG tilt bed   Goals of care: --Palliative care evaluated during hospitalization    Other problems: NASH cirrhosis:  Outpatient follow-up with gastroenterology  Mild hyponatremia --Resolved  Hypokalemia --Resolved  Acute renal failure -- Resolved  HLD:  -- Continue atorvastatin 40 mg daily  Atypical chest pain --Suspect GI etiology -cont. Protonix initiated and encourage upright after meals for at least 30 minutes      Data Reviewed: Basic Metabolic Panel: Recent Labs  Lab 01/24/21 0356  NA 134*  K 3.9  CL 97*  CO2 29  GLUCOSE 95  BUN 11  CREATININE 0.53*  CALCIUM 8.8*   CBC: Recent Labs  Lab 01/24/21 0356  WBC 7.4  HGB 8.9*  HCT 28.1*  MCV 95.9  PLT 278   Cardiac Enzymes: Recent  Labs  Lab 01/27/21 0457  CKTOTAL 76    CBG: Recent Labs  Lab 01/28/21 0757 01/28/21 1222 01/28/21 1657 01/28/21 2109 01/29/21 0725  GLUCAP 97 99 113* 129* 112*     Studies: No results found.  Scheduled Meds: . apixaban  5 mg Oral BID  . aspirin  81 mg Oral Daily  . atorvastatin  40 mg Oral Daily  . carvedilol  3.125 mg Oral  BID WC  . chlorhexidine  15 mL Mouth Rinse BID  . diclofenac Sodium  2 g Topical QID  . docusate  50 mg Oral Daily  . DULoxetine  30 mg Oral Daily  . feeding supplement  237 mL Oral BID BM  . insulin aspart  0-5 Units Subcutaneous QHS  . insulin aspart  0-9 Units Subcutaneous TID WC  . insulin detemir  16 Units Subcutaneous BID  . lidocaine  1 patch Transdermal Q24H  . methocarbamol  1,000 mg Oral TID AC & HS  . multivitamin with minerals  1 tablet Oral Daily  . nutrition supplement (JUVEN)  1 packet Oral BID BM  . oxyCODONE  20 mg Oral Q12H  . pantoprazole  40 mg Oral BID AC  . polyethylene glycol  17 g Oral Daily  . QUEtiapine  50 mg Oral QHS  . sodium chloride flush  10-40 mL Intracatheter Q12H  . vitamin B-12  100 mcg Oral Daily  . zolpidem  10 mg Oral QHS   Continuous Infusions: . sodium chloride 50 mL (01/19/21 2154)  . DAPTOmycin (CUBICIN)  IV 900 mg (01/28/21 2243)    Active Problems:   Acute respiratory failure (HCC)   AKI (acute kidney injury) ([place])   Bacteremia due to Escherichia coli   Ureteral stone   Pyelonephritis   Cerebral thrombosis with cerebral infarction   Staphylococcus epidermidis bacteremia   Pressure injury of skin   Dysphagia, pharyngoesophageal phase   Physical deconditioning   Consultants:  PCCM  Urology Dr. Claudia Desanctis, Dr. Matilde Sprang  Nephrology, Dr. Jonnie Finner, Dr. Augustin Coupe  Neurology, Dr. Leonie Man  Infectious disease    Procedures:  See above    Time spent: 20 minutes    Erin Hearing ANP  [facility] 7 am - 330 pm/M-F for direct patient care and secure chat Please refer to [place] for contact info 50  days

## 2021-01-30 LAB — GLUCOSE, CAPILLARY
Glucose-Capillary: 89 mg/dL (ref 70–99)
Glucose-Capillary: 105 mg/dL — ABNORMAL HIGH (ref 70–99)
Glucose-Capillary: 125 mg/dL — ABNORMAL HIGH (ref 70–99)
Glucose-Capillary: 155 mg/dL — ABNORMAL HIGH (ref 70–99)

## 2021-01-30 NOTE — Progress Notes (Signed)
PROGRESS NOTE    [name]  WUJ:[id number] DOB: [birth year] DOA: 12/10/2020 PCP: Pcp, No    Brief Narrative:  60 year old male with past medical history significant for essential hypertension, hyperlipidemia who [facility] for a 5-day history of nausea/vomiting/diarrhea and confusion. Patient was initially admitted to [facility] service for acute hypoxic respiratory failure requiring intubation and ventilatory support with septic shock secondary to E. coli septicemia, UTI, MSSA Pneumonia, MRSE bacteremia (CLABSI involving left IJ vein and subclavian vein), with possible endovascular infection. Infectious disease followed during hospital course.  [facility] for further management of ongoing metabolic encephalopathy, tachycardia, dysphagia requiring tube feeds, intermittent fevers and prolonged antibiotic course.  He is now reporting burning and tingling in LUE  Assessment & Plan:   Active Problems:   Acute respiratory failure (HCC)   AKI (acute kidney injury) (HCC)   Bacteremia due to Escherichia coli   Ureteral stone   Pyelonephritis   Cerebral thrombosis with cerebral infarction   Staphylococcus epidermidis bacteremia   Pressure injury of skin   Dysphagia, pharyngoesophageal phase   Physical deconditioning  Acute problems: Acute hypoxic respiratory failure: Resolved MSSA pneumonia --Presented with hypoxia s/p mechanical ventilation due to sepsis physiology -Currently stable on minimal O2 support  MRSE endovascular infection/septicemia E. coli UTI/septicemia Septic shock, POA --Infectious disease following --Continue daptomycin 900 mg IV daily x 6 weeks from 4/24(~02/14/21) --ID Dr. Karene FryManandahar who recommended continuing IV antibiotics for 3 to 4 weeks before consideration of oral antibiotic  Acute hyponatremia with dehydration and associated recurrent orthostasis Resolved --Recurrent orthostasis suspected  possibly related to pain related vagal response --Continue TED hose and consider abdominal binder-see below regarding lumbar sacral support  Recent COVID infection -- Antibodies positive 5/20  Back pain w/ activity -Continue OxyContin to 20 mg BID -Continue Oxy IR for breakthrough pain, lidocaine patch and Robaxin   Acute toxic metabolic encephalopathy/ongoing delirium/subacute vs chronic infarct with dense insensate LUE hemiparesis -Acute encephalopathy resolved --continue asa and statin  Stage III right buttock decubitus -Initially started as a deep tissue injury and has progressed to decubitus --Completed 10 days of Flagyl --Wound care RN following -continue Santyl and hydrotherapy  Type 2 diabetes mellitus --Home med: Glipizide --Hemoglobin A1c 6.8. -- Decrease Lantus to 16 units twice daily  Mild constipation --One-time dose of milk of magnesia effective; continue MiraLAX and Colace  Left IJ/subclavian DVT versus septic thrombus -Left IJ central line subsequently discontinued and RUE PICC line removed 4/24  --Repeat ultrasound duplex scan 4/25 with resolution of DVT. --Continue Lovenox therapeutic dose  Essential hypertension --Continue carvedilol   Recurrent pyelonephritis with obstructive hydronephrosis --s/p left ureteral stent 4/6.  --Repeat ultrasound with mild left hydronephrosis. CT abdomen with stable position of double-J stent.  Dysphagia/mild protein calorie malnutrition/morbid obesity -- Continue regular diet --Estimated body mass index is 33.99 kg/m as calculated from the following:   Height as of this encounter: 6\' 2"  (1.88 m).   Weight as of this encounter: 120.1 kg.  Weakness/deconditioning/debility: --Continue PT/OT  -Continue KREG tilt bed   Goals of care: --Palliative care evaluated during hospitalization  Other problems: NASH cirrhosis: Outpatient follow-up with gastroenterology  Mild  hyponatremia --Resolved  Hypokalemia --Resolved  Acute renal failure -- Resolved  HLD: -- Continue atorvastatin 40 mg daily  Atypical chest pain --Suspect GI etiology -cont. Protonix initiated and encourage upright after meals for at least 30 minutes  DVT prophylaxis: Eliquis Code Status: DNR Family Communication: Pt in  room, family not at bedside  Status is: Inpatient  Remains inpatient appropriate because:Unsafe d/c plan   Dispo: The patient is from: Home              Anticipated d/c is to: SNF              Patient currently is not medically stable to d/c.   Difficult to place patient No   Antimicrobials: Anti-infectives (From admission, onward)   Start     Dose/Rate Route Frequency Ordered Stop   01/13/21 2000  DAPTOmycin (CUBICIN) 900 mg in sodium chloride 0.9 % IVPB        9 mg/kg  97.6 kg (Adjusted) 136 mL/hr over 30 Minutes Intravenous Daily 01/12/21 1956 02/14/21 2359   01/07/21 0945  ceFAZolin (ANCEF) IVPB 2g/100 mL premix        2 g 200 mL/hr over 30 Minutes Intravenous Every 8 hours 01/07/21 0855 01/14/21 0959   01/06/21 1000  metroNIDAZOLE (FLAGYL) tablet 500 mg        500 mg Oral Every 12 hours 01/06/21 0903 01/15/21 2200   01/05/21 1700  DAPTOmycin (CUBICIN) 900 mg in sodium chloride 0.9 % IVPB  Status:  Discontinued        9 mg/kg  97.6 kg (Adjusted) 136 mL/hr over 30 Minutes Intravenous Daily 01/05/21 0837 01/05/21 0845   01/05/21 1500  DAPTOmycin (CUBICIN) 900 mg in sodium chloride 0.9 % IVPB  Status:  Discontinued        9 mg/kg  97.6 kg (Adjusted) 136 mL/hr over 30 Minutes Intravenous Daily 01/05/21 0845 01/12/21 1956   01/05/21 0800  cefTRIAXone (ROCEPHIN) 2 g in sodium chloride 0.9 % 100 mL IVPB  Status:  Discontinued        2 g 200 mL/hr over 30 Minutes Intravenous Every 24 hours 01/04/21 0436 01/07/21 0855   01/04/21 0530  cefTRIAXone (ROCEPHIN) 2 g in sodium chloride 0.9 % 100 mL IVPB        2 g 200 mL/hr over 30 Minutes Intravenous   Once 01/04/21 0436 01/04/21 0709   01/04/21 0500  vancomycin (VANCOREADY) IVPB 1000 mg/200 mL  Status:  Discontinued        1,000 mg 200 mL/hr over 60 Minutes Intravenous Every 12 hours 01/03/21 2216 01/05/21 0837   01/01/21 0600  vancomycin (VANCOREADY) IVPB 500 mg/100 mL  Status:  Discontinued        500 mg 100 mL/hr over 60 Minutes Intravenous Every 12 hours 12/31/20 1036 01/03/21 2216   12/29/20 0600  vancomycin (VANCOREADY) IVPB 1000 mg/200 mL  Status:  Discontinued        1,000 mg 200 mL/hr over 60 Minutes Intravenous Every 24 hours 12/28/20 0713 12/31/20 1036   12/25/20 0600  vancomycin (VANCOREADY) IVPB 1500 mg/300 mL  Status:  Discontinued        1,500 mg 150 mL/hr over 120 Minutes Intravenous Every 24 hours 12/24/20 1840 12/28/20 0713   12/24/20 1915  vancomycin (VANCOCIN) 2,500 mg in sodium chloride 0.9 % 500 mL IVPB        2,500 mg 250 mL/hr over 120 Minutes Intravenous  Once 12/24/20 1820 12/24/20 2147   12/24/20 1000  cefTRIAXone (ROCEPHIN) 2 g in sodium chloride 0.9 % 100 mL IVPB  Status:  Discontinued        2 g 200 mL/hr over 30 Minutes Intravenous Every 24 hours 12/24/20 0856 12/25/20 1607   12/17/20 2200  cefTRIAXone (ROCEPHIN) 2 g in sodium chloride 0.9 % 100  mL IVPB        2 g 200 mL/hr over 30 Minutes Intravenous Every 24 hours 12/17/20 1123 12/21/20 2135   12/16/20 1100  ceFEPIme (MAXIPIME) 2 g in sodium chloride 0.9 % 100 mL IVPB  Status:  Discontinued        2 g 200 mL/hr over 30 Minutes Intravenous Every 12 hours 12/16/20 0946 12/17/20 1123   12/14/20 1400  ceFAZolin (ANCEF) IVPB 2g/100 mL premix  Status:  Discontinued        2 g 200 mL/hr over 30 Minutes Intravenous Every 8 hours 12/14/20 1223 12/16/20 0946   12/10/20 1530  cefTRIAXone (ROCEPHIN) 2 g in sodium chloride 0.9 % 100 mL IVPB  Status:  Discontinued        2 g 200 mL/hr over 30 Minutes Intravenous Every 24 hours 12/10/20 1436 12/14/20 1223   12/10/20 1200  piperacillin-tazobactam (ZOSYN) IVPB 2.25  g  Status:  Discontinued        2.25 g 100 mL/hr over 30 Minutes Intravenous Every 6 hours 12/10/20 0740 12/10/20 1436   12/10/20 0600  vancomycin (VANCOREADY) IVPB 1500 mg/300 mL        1,500 mg 150 mL/hr over 120 Minutes Intravenous STAT 12/10/20 0546 12/10/20 0821   12/10/20 0400  vancomycin (VANCOREADY) IVPB 1000 mg/200 mL        1,000 mg 200 mL/hr over 60 Minutes Intravenous  Once 12/10/20 0350 12/10/20 0520   12/10/20 0400  piperacillin-tazobactam (ZOSYN) IVPB 3.375 g        3.375 g 12.5 mL/hr over 240 Minutes Intravenous  Once 12/10/20 0350 12/10/20 0450       Subjective: Without complaints. Eager to be discharged  Objective: Vitals:   01/30/21 0552 01/30/21 0957 01/30/21 1107 01/30/21 1502  BP:  131/78 118/83 113/75  Pulse:  97 98 97  Resp:   20   Temp: 98.5 F (36.9 C)  98.7 F (37.1 C)   TempSrc: Oral  Oral   SpO2:   93%   Weight: 120.1 kg     Height:        Intake/Output Summary (Last 24 hours) at 01/30/2021 1725 Last data filed at 01/30/2021 0500 Gross per 24 hour  Intake --  Output 750 ml  Net -750 ml   Filed Weights   01/19/21 0700 01/29/21 0610 01/30/21 0552  Weight: 118.7 kg 120.1 kg 120.1 kg    Examination: General exam: Awake, laying in bed, in nad Respiratory system: Normal respiratory effort, no wheezing  Data Reviewed: I have personally reviewed following labs and imaging studies  CBC: Recent Labs  Lab 01/24/21 0356  WBC 7.4  HGB 8.9*  HCT 28.1*  MCV 95.9  PLT 278   Basic Metabolic Panel: Recent Labs  Lab 01/24/21 0356  NA 134*  K 3.9  CL 97*  CO2 29  GLUCOSE 95  BUN 11  CREATININE 0.53*  CALCIUM 8.8*   GFR: Estimated Creatinine Clearance: 138.7 mL/min (A) (by C-G formula based on SCr of 0.53 mg/dL (L)). Liver Function Tests: No results for input(s): AST, ALT, ALKPHOS, BILITOT, PROT, ALBUMIN in the last 168 hours. No results for input(s): LIPASE, AMYLASE in the last 168 hours. No results for input(s): AMMONIA in the  last 168 hours. Coagulation Profile: No results for input(s): INR, PROTIME in the last 168 hours. Cardiac Enzymes: Recent Labs  Lab 01/27/21 0457  CKTOTAL 76   BNP (last 3 results) No results for input(s): PROBNP in the last 8760 hours. HbA1C:  No results for input(s): HGBA1C in the last 72 hours. CBG: Recent Labs  Lab 01/29/21 1606 01/29/21 1957 01/30/21 0729 01/30/21 1200 01/30/21 1613  GLUCAP 138* 131* 89 105* 125*   Lipid Profile: No results for input(s): CHOL, HDL, LDLCALC, TRIG, CHOLHDL, LDLDIRECT in the last 72 hours. Thyroid Function Tests: No results for input(s): TSH, T4TOTAL, FREET4, T3FREE, THYROIDAB in the last 72 hours. Anemia Panel: No results for input(s): VITAMINB12, FOLATE, FERRITIN, TIBC, IRON, RETICCTPCT in the last 72 hours. Sepsis Labs: No results for input(s): PROCALCITON, LATICACIDVEN in the last 168 hours.  No results found for this or any previous visit (from the past 240 hour(s)).   Radiology Studies: No results found.  Scheduled Meds: . apixaban  5 mg Oral BID  . aspirin  81 mg Oral Daily  . atorvastatin  40 mg Oral Daily  . carvedilol  3.125 mg Oral BID WC  . chlorhexidine  15 mL Mouth Rinse BID  . diclofenac Sodium  2 g Topical QID  . docusate  50 mg Oral Daily  . DULoxetine  30 mg Oral Daily  . feeding supplement  237 mL Oral BID BM  . insulin aspart  0-5 Units Subcutaneous QHS  . insulin aspart  0-9 Units Subcutaneous TID WC  . insulin detemir  16 Units Subcutaneous BID  . lidocaine  1 patch Transdermal Q24H  . methocarbamol  1,000 mg Oral TID AC & HS  . multivitamin with minerals  1 tablet Oral Daily  . nutrition supplement (JUVEN)  1 packet Oral BID BM  . oxyCODONE  20 mg Oral Q12H  . pantoprazole  40 mg Oral BID AC  . polyethylene glycol  17 g Oral Daily  . QUEtiapine  50 mg Oral QHS  . sodium chloride flush  10-40 mL Intracatheter Q12H  . vitamin B-12  100 mcg Oral Daily  . zolpidem  10 mg Oral QHS   Continuous  Infusions: . sodium chloride 50 mL (01/19/21 2154)  . DAPTOmycin (CUBICIN)  IV Stopped (01/30/21 0726)     LOS: 51 days   Rickey Barbara, MD [facility] Pager On Amion  If 7PM-7AM, please contact night-coverage 01/30/2021, 5:25 PM

## 2021-01-31 LAB — GLUCOSE, CAPILLARY
Glucose-Capillary: 107 mg/dL — ABNORMAL HIGH (ref 70–99)
Glucose-Capillary: 100 mg/dL — ABNORMAL HIGH (ref 70–99)
Glucose-Capillary: 102 mg/dL — ABNORMAL HIGH (ref 70–99)
Glucose-Capillary: 109 mg/dL — ABNORMAL HIGH (ref 70–99)

## 2021-01-31 NOTE — Progress Notes (Signed)
Physical Therapy Treatment Patient Details Name: [name] MRN: [id number] DOB: 10/10/1960 Today's Date: 01/31/2021    History of Present Illness 60 year old white male who presented to the emergency room from home 12/10/20.  Pt had 4-5 days of N/V/D. Pt became more confused and at times belligerent. Admitted for hypoxemic respiratory failure and septic shock 2/2 E. Coli bacteremia and UTI and was intubated. Extubated 4/16, now on cortrak. dense LUE paresis; CT head "late subacute to chronic infarct within the  posterior right frontal lobe"    PT Comments    Pt with great improvement in upright activity tolerance. Was able to sit EOB for 20 minutes with stable BP. Able to stand with 2 person assist for a short time. Pt very encouraged with sitting up.    Orthostatic BPs  Supine 117/75  Bed HOB 45 deg 121/75  Bed HOB 45 deg after 2 min 113/85  Bed HOB 60 deg 113/77  Sitting EOB 124/79  Sitting EOB after 5 min 115/87  Sitting EOB after 15 min 113/86  Standing 102/57     Follow Up Recommendations  SNF     Equipment Recommendations  Wheelchair (measurements PT);Hospital bed    Recommendations for Other Services       Precautions / Restrictions Precautions Precautions: Fall;Other (comment) Required Braces or Orthoses: Spinal Brace Spinal Brace: Lumbar corset;Applied in supine position (lumbar corset for back pain and orthostasis) Other Brace: TED hose for orthostasis    Mobility  Bed Mobility Overal bed mobility: Needs Assistance Bed Mobility: Rolling Rolling: Supervision   Supine to sit: Min assist;HOB elevated     General bed mobility comments: Assist to manage LUE and to elevate trunk into sitting    Transfers Overall transfer level: Needs assistance Equipment used: 2 person hand held assist Transfers: Sit to/from Stand Sit to Stand: +2 physical assistance;Mod assist         General transfer comment: Assist to bring hips up and for balance.  Verbal/tactile cues to extend lt knee and bil hips. Lt lateral lean initially with improvement with shifting in foot placement.  Ambulation/Gait                 Stairs             Wheelchair Mobility    Modified Rankin (Stroke Patients Only)       Balance Overall balance assessment: Needs assistance Sitting-balance support: No upper extremity supported Sitting balance-Leahy Scale: Fair Sitting balance - Comments: Sat EOB x 20 minutes with supervision   Standing balance support: Bilateral upper extremity supported Standing balance-Leahy Scale: Poor Standing balance comment: Stood x 30 sec with +2 mod assist                            Cognition Arousal/Alertness: Awake/alert Behavior During Therapy: WFL for tasks assessed/performed Overall Cognitive Status: Impaired/Different from baseline Area of Impairment: Memory;Safety/judgement                     Memory: Decreased short-term memory   Safety/Judgement: Decreased awareness of deficits (Pt thinks he can go straight home and be able to walk)            Exercises      General Comments        Pertinent Vitals/Pain Pain Assessment: Faces Faces Pain Scale: Hurts little more Pain Location: back Pain Descriptors / Indicators: Aching Pain Intervention(s): Premedicated before session    Home  Living                      Prior Function            PT Goals (current goals can now be found in the care plan section) Acute Rehab PT Goals Patient Stated Goal: to go home PT Goal Formulation: With patient Time For Goal Achievement: 02/14/21 Potential to Achieve Goals: Fair Progress towards PT goals: Progressing toward goals;Goals met and updated - see care plan;Goals downgraded-see care plan    Frequency    Min 3X/week      PT Plan Current plan remains appropriate    Co-evaluation              AM-PAC PT "6 Clicks" Mobility   Outcome Measure  Help needed  turning from your back to your side while in a flat bed without using bedrails?: A Little Help needed moving from lying on your back to sitting on the side of a flat bed without using bedrails?: A Little Help needed moving to and from a bed to a chair (including a wheelchair)?: Total Help needed standing up from a chair using your arms (e.g., wheelchair or bedside chair)?: Total Help needed to walk in hospital room?: Total Help needed climbing 3-5 steps with a railing? : Total 6 Click Score: 10    End of Session   Activity Tolerance: Patient tolerated treatment well Patient left: in bed;with call bell/phone within reach Nurse Communication:  (Nurse assisted with standing) PT Visit Diagnosis: Muscle weakness (generalized) (M62.81);Difficulty in walking, not elsewhere classified (R26.2);Other abnormalities of gait and mobility (R26.89)     Time: 1141-1230 PT Time Calculation (min) (ACUTE ONLY): 49 min  Charges:  $Therapeutic Activity: 23-37 mins                     [place] Pager [phone] Office [place] 01/31/2021, 1:51 PM

## 2021-01-31 NOTE — Progress Notes (Signed)
PROGRESS NOTE    [name]  UVO:[id number] DOB: 1961/07/29 DOA: 12/10/2020 PCP: Pcp, No    Brief Narrative:  60 year old male with past medical history significant for essential hypertension, hyperlipidemia who presented to [facility], ED on 3/31 for a 5-day history of nausea/vomiting/diarrhea and confusion. Patient was initially admitted to [facility] service for acute hypoxic respiratory failure requiring intubation and ventilatory support with septic shock secondary to E. coli septicemia, UTI, MSSA Pneumonia, MRSE bacteremia (CLABSI involving left IJ vein and subclavian vein), with possible endovascular infection. Infectious disease followed during hospital course.  Patient transferred from [facility] to [facility] on 01/01/2021 for further management of ongoing metabolic encephalopathy, tachycardia, dysphagia requiring tube feeds, intermittent fevers and prolonged antibiotic course.  He is now reporting burning and tingling in LUE  Assessment & Plan:   Active Problems:   Acute respiratory failure (HCC)   AKI (acute kidney injury) (HCC)   Bacteremia due to Escherichia coli   Ureteral stone   Pyelonephritis   Cerebral thrombosis with cerebral infarction   Staphylococcus epidermidis bacteremia   Pressure injury of skin   Dysphagia, pharyngoesophageal phase   Physical deconditioning  Acute problems: Acute hypoxic respiratory failure: Resolved MSSA pneumonia --Presented with hypoxia s/p mechanical ventilation due to sepsis physiology -Remains stable on minimal O2 support, conversing normally  MRSE endovascular infection/septicemia E. coli UTI/septicemia Septic shock, POA --Infectious disease following --Continue daptomycin 900 mg IV daily x 6 weeks from 4/24(~02/14/21) --ID Dr. Karene Fry who recommended continuing IV antibiotics for 3 to 4 weeks before consideration of oral antibiotic  Acute hyponatremia with dehydration and associated recurrent orthostasis Resolved --Recurrent  orthostasis suspected possibly related to pain related vagal response --Continue TED hose and consider abdominal binder-see below regarding lumbar sacral support  Recent COVID infection -- Antibodies positive 5/20  Back pain w/ activity -Continue OxyContin to 20 mg BID -Continue Oxy IR for breakthrough pain, lidocaine patch and Robaxin   Acute toxic metabolic encephalopathy/ongoing delirium/subacute vs chronic infarct with dense insensate LUE hemiparesis -Acute encephalopathy resolved --continue asa and statin  Stage III right buttock decubitus -Initially started as a deep tissue injury and has progressed to decubitus --Completed 10 days of Flagyl --Wound care RN following -continue Santyl and hydrotherapy  Type 2 diabetes mellitus --Home med: Glipizide --Hemoglobin A1c 6.8. -- Decrease Lantus to 16 units twice daily  Mild constipation --One-time dose of milk of magnesia effective; continue MiraLAX and Colace  Left IJ/subclavian DVT versus septic thrombus -Left IJ central line subsequently discontinued and RUE PICC line removed 4/24  --Repeat ultrasound duplex scan 4/25 with resolution of DVT. --Continue Lovenox therapeutic dose  Essential hypertension --Continue carvedilol   Recurrent pyelonephritis with obstructive hydronephrosis --s/p left ureteral stent 4/6.  --Repeat ultrasound with mild left hydronephrosis. CT abdomen with stable position of double-J stent.  Dysphagia/mild protein calorie malnutrition/morbid obesity -- Continue regular diet --Estimated body mass index is 33.99 kg/m as calculated from the following:   Height as of this encounter: 6\' 2"  (1.88 m).   Weight as of this encounter: 120.1 kg.  Weakness/deconditioning/debility: --Continue PT/OT  -Continue KREG tilt bed   Goals of care: --Palliative care evaluated during hospitalization  Other problems: NASH cirrhosis: Outpatient follow-up with gastroenterology  Mild  hyponatremia --Resolved  Hypokalemia --Resolved  Acute renal failure -- Resolved  HLD: -- Continue atorvastatin 40 mg daily  Atypical chest pain --Suspect GI etiology -cont. Protonix initiated and encourage upright after meals for at least 30 minutes  DVT prophylaxis: Eliquis Code Status: DNR Family Communication:  Pt in room, family not at bedside  Status is: Inpatient  Remains inpatient appropriate because:Unsafe d/c plan   Dispo: The patient is from: Home              Anticipated d/c is to: SNF              Patient currently is not medically stable to d/c.   Difficult to place patient No   Antimicrobials: Anti-infectives (From admission, onward)   Start     Dose/Rate Route Frequency Ordered Stop   01/13/21 2000  DAPTOmycin (CUBICIN) 900 mg in sodium chloride 0.9 % IVPB        9 mg/kg  97.6 kg (Adjusted) 136 mL/hr over 30 Minutes Intravenous Daily 01/12/21 1956 02/14/21 2359   01/07/21 0945  ceFAZolin (ANCEF) IVPB 2g/100 mL premix        2 g 200 mL/hr over 30 Minutes Intravenous Every 8 hours 01/07/21 0855 01/14/21 0959   01/06/21 1000  metroNIDAZOLE (FLAGYL) tablet 500 mg        500 mg Oral Every 12 hours 01/06/21 0903 01/15/21 2200   01/05/21 1700  DAPTOmycin (CUBICIN) 900 mg in sodium chloride 0.9 % IVPB  Status:  Discontinued        9 mg/kg  97.6 kg (Adjusted) 136 mL/hr over 30 Minutes Intravenous Daily 01/05/21 0837 01/05/21 0845   01/05/21 1500  DAPTOmycin (CUBICIN) 900 mg in sodium chloride 0.9 % IVPB  Status:  Discontinued        9 mg/kg  97.6 kg (Adjusted) 136 mL/hr over 30 Minutes Intravenous Daily 01/05/21 0845 01/12/21 1956   01/05/21 0800  cefTRIAXone (ROCEPHIN) 2 g in sodium chloride 0.9 % 100 mL IVPB  Status:  Discontinued        2 g 200 mL/hr over 30 Minutes Intravenous Every 24 hours 01/04/21 0436 01/07/21 0855   01/04/21 0530  cefTRIAXone (ROCEPHIN) 2 g in sodium chloride 0.9 % 100 mL IVPB        2 g 200 mL/hr over 30 Minutes Intravenous   Once 01/04/21 0436 01/04/21 0709   01/04/21 0500  vancomycin (VANCOREADY) IVPB 1000 mg/200 mL  Status:  Discontinued        1,000 mg 200 mL/hr over 60 Minutes Intravenous Every 12 hours 01/03/21 2216 01/05/21 0837   01/01/21 0600  vancomycin (VANCOREADY) IVPB 500 mg/100 mL  Status:  Discontinued        500 mg 100 mL/hr over 60 Minutes Intravenous Every 12 hours 12/31/20 1036 01/03/21 2216   12/29/20 0600  vancomycin (VANCOREADY) IVPB 1000 mg/200 mL  Status:  Discontinued        1,000 mg 200 mL/hr over 60 Minutes Intravenous Every 24 hours 12/28/20 0713 12/31/20 1036   12/25/20 0600  vancomycin (VANCOREADY) IVPB 1500 mg/300 mL  Status:  Discontinued        1,500 mg 150 mL/hr over 120 Minutes Intravenous Every 24 hours 12/24/20 1840 12/28/20 0713   12/24/20 1915  vancomycin (VANCOCIN) 2,500 mg in sodium chloride 0.9 % 500 mL IVPB        2,500 mg 250 mL/hr over 120 Minutes Intravenous  Once 12/24/20 1820 12/24/20 2147   12/24/20 1000  cefTRIAXone (ROCEPHIN) 2 g in sodium chloride 0.9 % 100 mL IVPB  Status:  Discontinued        2 g 200 mL/hr over 30 Minutes Intravenous Every 24 hours 12/24/20 0856 12/25/20 1607   12/17/20 2200  cefTRIAXone (ROCEPHIN) 2 g in sodium chloride 0.9 %  100 mL IVPB        2 g 200 mL/hr over 30 Minutes Intravenous Every 24 hours 12/17/20 1123 12/21/20 2135   12/16/20 1100  ceFEPIme (MAXIPIME) 2 g in sodium chloride 0.9 % 100 mL IVPB  Status:  Discontinued        2 g 200 mL/hr over 30 Minutes Intravenous Every 12 hours 12/16/20 0946 12/17/20 1123   12/14/20 1400  ceFAZolin (ANCEF) IVPB 2g/100 mL premix  Status:  Discontinued        2 g 200 mL/hr over 30 Minutes Intravenous Every 8 hours 12/14/20 1223 12/16/20 0946   12/10/20 1530  cefTRIAXone (ROCEPHIN) 2 g in sodium chloride 0.9 % 100 mL IVPB  Status:  Discontinued        2 g 200 mL/hr over 30 Minutes Intravenous Every 24 hours 12/10/20 1436 12/14/20 1223   12/10/20 1200  piperacillin-tazobactam (ZOSYN) IVPB 2.25  g  Status:  Discontinued        2.25 g 100 mL/hr over 30 Minutes Intravenous Every 6 hours 12/10/20 0740 12/10/20 1436   12/10/20 0600  vancomycin (VANCOREADY) IVPB 1500 mg/300 mL        1,500 mg 150 mL/hr over 120 Minutes Intravenous STAT 12/10/20 0546 12/10/20 0821   12/10/20 0400  vancomycin (VANCOREADY) IVPB 1000 mg/200 mL        1,000 mg 200 mL/hr over 60 Minutes Intravenous  Once 12/10/20 0350 12/10/20 0520   12/10/20 0400  piperacillin-tazobactam (ZOSYN) IVPB 3.375 g        3.375 g 12.5 mL/hr over 240 Minutes Intravenous  Once 12/10/20 0350 12/10/20 0450      Subjective: No complaints  Objective: Vitals:   01/30/21 2120 01/31/21 0500 01/31/21 0607 01/31/21 1229  BP: 126/72  111/71 113/86  Pulse: 99  96 96  Resp: 18  16 20   Temp: 99 F (37.2 C)  98.7 F (37.1 C) 97.7 F (36.5 C)  TempSrc: Oral  Oral Oral  SpO2: 97%  97% 96%  Weight:  121 kg    Height:        Intake/Output Summary (Last 24 hours) at 01/31/2021 1604 Last data filed at 01/31/2021 1000 Gross per 24 hour  Intake --  Output 1000 ml  Net -1000 ml   Filed Weights   01/29/21 0610 01/30/21 0552 01/31/21 0500  Weight: 120.1 kg 120.1 kg 121 kg    Examination: General exam: Conversant, in no acute distress Respiratory system: normal chest rise, clear, no audible wheezing  Data Reviewed: I have personally reviewed following labs and imaging studies  CBC: No results for input(s): WBC, NEUTROABS, HGB, HCT, MCV, PLT in the last 168 hours. Basic Metabolic Panel: No results for input(s): NA, K, CL, CO2, GLUCOSE, BUN, CREATININE, CALCIUM, MG, PHOS in the last 168 hours. GFR: Estimated Creatinine Clearance: 139.1 mL/min (A) (by C-G formula based on SCr of 0.53 mg/dL (L)). Liver Function Tests: No results for input(s): AST, ALT, ALKPHOS, BILITOT, PROT, ALBUMIN in the last 168 hours. No results for input(s): LIPASE, AMYLASE in the last 168 hours. No results for input(s): AMMONIA in the last 168  hours. Coagulation Profile: No results for input(s): INR, PROTIME in the last 168 hours. Cardiac Enzymes: Recent Labs  Lab 01/27/21 0457  CKTOTAL 76   BNP (last 3 results) No results for input(s): PROBNP in the last 8760 hours. HbA1C: No results for input(s): HGBA1C in the last 72 hours. CBG: Recent Labs  Lab 01/30/21 1200 01/30/21 1613 01/30/21 2116  01/31/21 0736 01/31/21 1227  GLUCAP 105* 125* 155* 107* 100*   Lipid Profile: No results for input(s): CHOL, HDL, LDLCALC, TRIG, CHOLHDL, LDLDIRECT in the last 72 hours. Thyroid Function Tests: No results for input(s): TSH, T4TOTAL, FREET4, T3FREE, THYROIDAB in the last 72 hours. Anemia Panel: No results for input(s): VITAMINB12, FOLATE, FERRITIN, TIBC, IRON, RETICCTPCT in the last 72 hours. Sepsis Labs: No results for input(s): PROCALCITON, LATICACIDVEN in the last 168 hours.  No results found for this or any previous visit (from the past 240 hour(s)).   Radiology Studies: No results found.  Scheduled Meds: . apixaban  5 mg Oral BID  . aspirin  81 mg Oral Daily  . atorvastatin  40 mg Oral Daily  . carvedilol  3.125 mg Oral BID WC  . chlorhexidine  15 mL Mouth Rinse BID  . diclofenac Sodium  2 g Topical QID  . docusate  50 mg Oral Daily  . DULoxetine  30 mg Oral Daily  . feeding supplement  237 mL Oral BID BM  . insulin aspart  0-5 Units Subcutaneous QHS  . insulin aspart  0-9 Units Subcutaneous TID WC  . insulin detemir  16 Units Subcutaneous BID  . lidocaine  1 patch Transdermal Q24H  . methocarbamol  1,000 mg Oral TID AC & HS  . multivitamin with minerals  1 tablet Oral Daily  . nutrition supplement (JUVEN)  1 packet Oral BID BM  . oxyCODONE  20 mg Oral Q12H  . pantoprazole  40 mg Oral BID AC  . polyethylene glycol  17 g Oral Daily  . QUEtiapine  50 mg Oral QHS  . sodium chloride flush  10-40 mL Intracatheter Q12H  . vitamin B-12  100 mcg Oral Daily  . zolpidem  10 mg Oral QHS   Continuous Infusions: .  sodium chloride 50 mL (01/19/21 2154)  . DAPTOmycin (CUBICIN)  IV Stopped (01/31/21 0734)     LOS: 52 days   Rickey Barbara, MD [facility] Pager On Amion  If 7PM-7AM, please contact night-coverage 01/31/2021, 4:04 PM

## 2021-02-01 LAB — COMPREHENSIVE METABOLIC PANEL
ALT: 19 U/L (ref 0–44)
AST: 23 U/L (ref 15–41)
Albumin: 1.9 g/dL — ABNORMAL LOW (ref 3.5–5.0)
Alkaline Phosphatase: 93 U/L (ref 38–126)
Anion gap: 7 (ref 5–15)
BUN: 14 mg/dL (ref 6–20)
CO2: 31 mmol/L (ref 22–32)
Calcium: 9 mg/dL (ref 8.9–10.3)
Chloride: 98 mmol/L (ref 98–111)
Creatinine, Ser: 0.59 mg/dL — ABNORMAL LOW (ref 0.61–1.24)
GFR, Estimated: >60 mL/min (ref >60)
Glucose, Bld: 107 mg/dL — ABNORMAL HIGH (ref 70–99)
Potassium: 3.7 mmol/L (ref 3.5–5.1)
Sodium: 136 mmol/L (ref 135–145)
Total Bilirubin: 0.6 mg/dL (ref 0.3–1.2)
Total Protein: 6.6 g/dL (ref 6.5–8.1)

## 2021-02-01 LAB — GLUCOSE, CAPILLARY
Glucose-Capillary: 101 mg/dL — ABNORMAL HIGH (ref 70–99)
Glucose-Capillary: 122 mg/dL — ABNORMAL HIGH (ref 70–99)
Glucose-Capillary: 114 mg/dL — ABNORMAL HIGH (ref 70–99)
Glucose-Capillary: 126 mg/dL — ABNORMAL HIGH (ref 70–99)

## 2021-02-01 LAB — CBC
HCT: 30.6 % — ABNORMAL LOW (ref 39.0–52.0)
Hemoglobin: 9.7 g/dL — ABNORMAL LOW (ref 13.0–17.0)
MCH: 29.9 pg (ref 26.0–34.0)
MCHC: 31.7 g/dL (ref 30.0–36.0)
MCV: 94.4 fL (ref 80.0–100.0)
Platelets: 359 10*3/uL (ref 150–400)
RBC: 3.24 MIL/uL — ABNORMAL LOW (ref 4.22–5.81)
RDW: 14.9 % (ref 11.5–15.5)
WBC: 7.2 10*3/uL (ref 4.0–10.5)
nRBC: 0 % (ref 0.0–0.2)

## 2021-02-01 NOTE — Progress Notes (Signed)
CSW spoke with Rasheema at [facility] who states the facility can accept this patient as a LOG. Rasheema states patient has to transition from IV antibiotics to oral prior to admission into the facility.   CSW informed NP of change request from facility.  CSW spoke with Dennison Bulla of [facility] who states patient's Medicaid application was successfully submitted to [facility]. The assigned Medicaid worker is Janece Canterbury at [phone].  Edwin Dada, MSW, LCSW Transitions of Care  Clinical Social Worker II [phone]

## 2021-02-01 NOTE — Progress Notes (Signed)
Orthopedic Tech Progress Note Patient Details:  [name] 1960-11-21 [id number]  Ortho Devices Type of Ortho Device: Arm sling Ortho Device/Splint Location: LLE Ortho Device/Splint Interventions: Ordered,Adjustment   Post Interventions Patient Tolerated: Well Instructions Provided: Care of device   [name] 02/01/2021, 12:09 PM

## 2021-02-01 NOTE — Progress Notes (Signed)
Physical Therapy Treatment Patient Details Name: [name] MRN: [id number] DOB: Jan 31, 1961 Today's Date: 02/01/2021    History of Present Illness 60 year old white male who presented to the emergency room from home 12/10/20.  Pt had 4-5 days of N/V/D. Pt became more confused and at times belligerent. Admitted for hypoxemic respiratory failure and septic shock 2/2 E. Coli bacteremia and UTI and was intubated. Extubated 4/16, now on cortrak. dense LUE paresis; CT head "late subacute to chronic infarct within the  posterior right frontal lobe"    PT Comments    Patient seen for second session to utilize tilt feature of bed with better results than earlier today. Utilized time in tilt for mini-squats.   Orthostatic BPs HOB 0 degrees 104/60 (72)  Tilt 10 degrees 124/82  (93)  Tilt 20 degrees 118/76 (86); 110/73 (82) 118/77 (88)  Tilt 25 degrees 113/72 (84)  Tilt 30  105/70 (80); 118/74 (88)  Tilt 40 107/67 (77) 106/83 (89)  Tilt 45 106/76 (86)  Tilt 55 121/85 (92)     Follow Up Recommendations  SNF     Equipment Recommendations  Wheelchair (measurements PT);Hospital bed    Recommendations for Other Services       Precautions / Restrictions Precautions Precautions: Fall;Other (comment) Precaution Comments: foley catheter Required Braces or Orthoses: Spinal Brace Spinal Brace: Lumbar corset;Applied in supine position (lumbar corset for back pain and orthostasis) Other Brace: TED hose for orthostasis    Mobility  Bed Mobility Overal bed mobility: Needs Assistance Bed Mobility: Rolling Rolling: Min assist           Start Time: 1516 Angle: 55 degrees Total Minutes in Angle: 2 minutes (slow rise to 55 including doing squats; very fatigued when reached 55 and requested return to supine (not because of dizziness))  Transfers                    Ambulation/Gait             General Gait Details: unable   Stairs             Wheelchair  Mobility    Modified Rankin (Stroke Patients Only)       Balance                                            Cognition Arousal/Alertness: Awake/alert Behavior During Therapy: WFL for tasks assessed/performed Overall Cognitive Status: Impaired/Different from baseline Area of Impairment: Memory;Safety/judgement                     Memory: Decreased short-term memory   Safety/Judgement: Decreased awareness of deficits (Pt thinks he can go straight home and be able to walk)     General Comments: Pt asking today why we are using the tilt bed although this has been explained to him multiple times previously      Exercises General Exercises - Lower Extremity Mini-Sqauts: 10 reps (x 5 sets;)    General Comments        Pertinent Vitals/Pain Pain Assessment: Faces Faces Pain Scale: Hurts a little bit Pain Location: back Pain Descriptors / Indicators: Aching Pain Intervention(s): Premedicated before session    Home Living                      Prior Function  PT Goals (current goals can now be found in the care plan section) Acute Rehab PT Goals Patient Stated Goal: to go home PT Goal Formulation: With patient Time For Goal Achievement: 02/14/21 Potential to Achieve Goals: Fair Progress towards PT goals: Progressing toward goals    Frequency    Min 3X/week      PT Plan Current plan remains appropriate    Co-evaluation              AM-PAC PT "6 Clicks" Mobility   Outcome Measure  Help needed turning from your back to your side while in a flat bed without using bedrails?: A Little Help needed moving from lying on your back to sitting on the side of a flat bed without using bedrails?: A Little Help needed moving to and from a bed to a chair (including a wheelchair)?: Total Help needed standing up from a chair using your arms (e.g., wheelchair or bedside chair)?: Total Help needed to walk in hospital room?:  Total Help needed climbing 3-5 steps with a railing? : Total 6 Click Score: 10    End of Session   Activity Tolerance: Patient tolerated treatment well Patient left: in bed;with call bell/phone within reach   PT Visit Diagnosis: Muscle weakness (generalized) (M62.81);Difficulty in walking, not elsewhere classified (R26.2);Other abnormalities of gait and mobility (R26.89)     Time: 6004-5997 PT Time Calculation (min) (ACUTE ONLY): 62 min  Charges:  $Therapeutic Activity: 53-67 mins                      Jerolyn Center, PT Pager [phone]    Zena Amos 02/01/2021, 5:06 PM

## 2021-02-01 NOTE — Progress Notes (Signed)
[facility] PROGRESS NOTE  Vanessa Alesi WRU:[id number] DOB: 08-Mar-1961 DOA: 12/10/2020 PCP: Pcp, No  Status: Remains inpatient appropriate because:Altered mental status, Unsafe d/c plan, IV treatments appropriate due to intensity of illness or inability to take PO and Inpatient level of care appropriate due to severity of illness   Dispo: The patient is from: Home              Anticipated d/c is to: SNF-patient has a bed at [facility] once off IV daptomycin and transition to oral antibiotics.              Patient currently is not medically stable to d/c.   Difficult to place patient Yes              Level of care: Med-Surg  Code Status: Full Family Communication: wife 5/20 DVT prophylaxis: Eliquis Vaccination status: Unvaccinated-CM spoke with wife who states that for political reasons patient has refused COVID vaccination.  I have had extensive conversations with him over the past week regarding COVID-vaccine and as of 5/16 he is refusing the vaccine.   HPI: 60 year old male with past medical history significant for essential hypertension, hyperlipidemia who [facility] ED on 3/31 for a 5-day history of nausea/vomiting/diarrhea and confusion.  Patient was initially admitted to [facility] service for acute hypoxic respiratory failure requiring intubation and ventilatory support with septic shock secondary to E. coli septicemia, UTI, MSSA Pneumonia, MRSE bacteremia (CLABSI involving left IJ vein and subclavian vein), with possible endovascular infection.  Infectious disease followed during hospital course.  Patient transferred from [facility] to [facility] on 01/01/2021 for further management of ongoing metabolic encephalopathy, tachycardia, dysphagia requiring tube feeds, intermittent fevers and prolonged antibiotic course.  He is now reporting burning and tingling in LUE  Procedures/significant Hospital events:   Intubation 3/31  Triple-lumen central line placement 3/31  4/1  off pressors. E coli bacteremia, e coli uti. Some improvement in pulm opacities on CXR. On rocephin. TTE with LVEF 40-45%, LV global hypokinesis, G2DD, normal RV, dilated aortic root at 54m.  Bronchoscopy with BAL 4/5, Dr. STamala Julian 4/5 extubated, failed immediately due to mucous plugging identified on bronch after re-intubated. CT ABD with 4100mleft ureteral stone with mild edema of left kideny, large areas of consolidation in bilateral lung bases.   4/6 urology consulted for distal left ureteral stone, to OR overnight for stent placement, UCx sent. Versed added for agitation (on fent / precedex)  4/7 Off vasopressors  4/11 Vasopressors restarted   4/13: Left IJ holiday  4/15: Right IJ placed   4/16: extubated, remained agitated and restless requiring IV Precedex infusion  4/18: Cortrak tube insertion and removed 5/4  Antimicrobials:   Daptomycin 4/26>> (End 6/5)  Cefazolin 4/4 - 4/6, 4/28>> (End 5/4)  Metronidazole 4/27>> (End 5/4)  Cefepime 4/6 - 4/7  Ceftriaxone 3/31 - 3/31, 4/7 - 4/11, 4/14 - 4/15  Vancomycin 3/31 - 3/31, 4/14 - 4/24, 4/26 - 4/28  Zosyn 3/31 - 3/31    Subjective: Awake and alert.  Has questions on when he will be ready for discharge.  Discussed will need ID input but we are hopeful that sometime this week or early next week as he likely needs to complete a total of 4 weeks of IV antibiotics before can transition oral antibiotics.  Objective: Vitals:   01/31/21 1229 01/31/21 2116  BP: 113/86 105/68  Pulse: 96 96  Resp: 20 19  Temp: 97.7 F (36.5 C) 99 F (37.2 C)  SpO2: 96% 93%    Intake/Output Summary (Last 24 hours) at 02/01/2021 0749 Last data filed at 02/01/2021 0319 Gross per 24 hour  Intake --  Output 1200 ml  Net -1200 ml   Filed Weights   01/30/21 0552 01/31/21 0500 02/01/21 0500  Weight: 120.1 kg 121 kg 120.2 kg    Exam:  Constitutional: Alert, calm, no acute distress.  Appears to be comfortable. Respiratory: Anterior lung  sounds are clear, no increased work of breathing, room air. Cardiovascular: Normal heart sounds, regular pulse, normotensive, no peripheral edema. Abdomen:  LBM 5/22, nontender nondistended.  Eating well. Genitourinary: Condom cath in place light amber-colored urine noted Skin: Stage III decubitus on hip Neurologic:LUE hemiparesis slightly improved.  Patient with gross motor movement originating at the shoulder girth.  Reporting numbness and tingling.  Other extremities with strength 4/5. Psychiatric: Alert and oriented x3 with pleasant affect.  Assessment/Plan: Acute problems: Acute hypoxic respiratory failure: Resolved MSSA pneumonia --Presented with hypoxia s/p mechanical ventilation due to sepsis physiology  MRSE endovascular infection/septicemia E. coli UTI/septicemia Septic shock, POA --Infectious disease following --Continue daptomycin 900 mg IV daily x 6 weeks from 4/24 (~02/14/21) --ID considering possible transitioning to oral antibiotics after 4 weeks of therapy.  This date would be 5/27.  On 5/23 ID reconsulted to determine when can transition to oral antibiotics.  Acute hyponatremia with dehydration and associated recurrent orthostasis Resolved --Recurrent orthostasis suspected possibly related to pain related vagal response --Continue TED hose and consider abdominal binder-see below regarding lumbar sacral support  Recent COVID infection -- Antibodies positive 5/20  Back pain w/ activity -Continue OxyContin to 20 mg BID -Continue Oxy IR for breakthrough pain, lidocaine patch and Robaxin   Acute toxic metabolic encephalopathy/ongoing delirium/subacute vs chronic infarct with dense insensate LUE hemiparesis -Acute encephalopathy resolved --continue asa and statin  Stage III right buttock decubitus -Initially started as a deep tissue injury and has progressed to decubitus --Completed 10 days of Flagyl --Wound care RN following -continue Santyl and hydrotherapy  Type  2 diabetes mellitus --Home med: Glipizide --Hemoglobin A1c 6.8. -- Decrease Lantus to 16 units twice daily  Mild constipation --One-time dose of milk of magnesia effective; continue MiraLAX and Colace  Left IJ/subclavian DVT versus septic thrombus -Left IJ central line subsequently discontinued and RUE PICC line removed 4/24   --Repeat ultrasound duplex scan 4/25 with resolution of DVT. --Continue Lovenox therapeutic dose  Essential hypertension --Continue carvedilol   Recurrent pyelonephritis with obstructive hydronephrosis --s/p left ureteral stent 4/6.   --Repeat ultrasound with mild left hydronephrosis.  CT abdomen with stable position of double-J stent.  Dysphagia/mild protein calorie malnutrition/morbid obesity -- Continue regular diet --Estimated body mass index is 34.02 kg/m as calculated from the following:   Height as of this encounter: _0  (1.88 m).   Weight as of this encounter: 120.2 kg.  Weakness/deconditioning/debility: --Continue PT/OT  -Continue KREG tilt bed   Goals of care: --Palliative care evaluated during hospitalization    Other problems: NASH cirrhosis:  Outpatient follow-up with gastroenterology  Mild hyponatremia --Resolved  Hypokalemia --Resolved  Acute renal failure -- Resolved  HLD:  -- Continue atorvastatin 40 mg daily  Atypical chest pain --Suspect GI etiology -cont. Protonix initiated and encourage upright after meals for at least 30 minutes      Data Reviewed: Basic Metabolic Panel: Recent Labs  Lab 02/01/21 0401  NA 136  K 3.7  CL 98  CO2 31  GLUCOSE 107*  BUN 14  CREATININE 0.59*  CALCIUM  9.0   CBC: Recent Labs  Lab 02/01/21 0401  WBC 7.2  HGB 9.7*  HCT 30.6*  MCV 94.4  PLT 359   Cardiac Enzymes: Recent Labs  Lab 01/27/21 0457  CKTOTAL 76    CBG: Recent Labs  Lab 01/30/21 2116 01/31/21 0736 01/31/21 1227 01/31/21 1647 01/31/21 2105  GLUCAP 155* 107* 100* 102* 109*      Studies: No results found.  Scheduled Meds: . apixaban  5 mg Oral BID  . aspirin  81 mg Oral Daily  . atorvastatin  40 mg Oral Daily  . carvedilol  3.125 mg Oral BID WC  . chlorhexidine  15 mL Mouth Rinse BID  . diclofenac Sodium  2 g Topical QID  . docusate  50 mg Oral Daily  . DULoxetine  30 mg Oral Daily  . feeding supplement  237 mL Oral BID BM  . insulin aspart  0-5 Units Subcutaneous QHS  . insulin aspart  0-9 Units Subcutaneous TID WC  . insulin detemir  16 Units Subcutaneous BID  . lidocaine  1 patch Transdermal Q24H  . methocarbamol  1,000 mg Oral TID AC & HS  . multivitamin with minerals  1 tablet Oral Daily  . nutrition supplement (JUVEN)  1 packet Oral BID BM  . oxyCODONE  20 mg Oral Q12H  . pantoprazole  40 mg Oral BID AC  . polyethylene glycol  17 g Oral Daily  . QUEtiapine  50 mg Oral QHS  . sodium chloride flush  10-40 mL Intracatheter Q12H  . vitamin B-12  100 mcg Oral Daily  . zolpidem  10 mg Oral QHS   Continuous Infusions: . sodium chloride 50 mL (01/19/21 2154)  . DAPTOmycin (CUBICIN)  IV 900 mg (01/31/21 2042)    Active Problems:   Acute respiratory failure (HCC)   AKI (acute kidney injury) ([place])   Bacteremia due to Escherichia coli   Ureteral stone   Pyelonephritis   Cerebral thrombosis with cerebral infarction   Staphylococcus epidermidis bacteremia   Pressure injury of skin   Dysphagia, pharyngoesophageal phase   Physical deconditioning   Consultants:  PCCM  Urology Dr. Claudia Desanctis, Dr. Matilde Sprang  Nephrology, Dr. Jonnie Finner, Dr. Augustin Coupe  Neurology, Dr. Leonie Man  Infectious disease    Procedures:  See above    Time spent: 20 minutes    Erin Hearing ANP  [facility] 7 am - 330 pm/M-F for direct patient care and secure chat Please refer to Amion for contact info 53  days

## 2021-02-01 NOTE — Plan of Care (Signed)
Problem: Education: Goal: Knowledge of General Education information will improve Description: Including pain rating scale, medication(s)/side effects and non-pharmacologic comfort measures 02/01/2021 1009 by Netta Cedars D, LPN Outcome: Progressing 02/01/2021 1009 by Netta Cedars D, LPN Outcome: Progressing   Problem: Health Behavior/Discharge Planning: Goal: Ability to manage health-related needs will improve 02/01/2021 1009 by Netta Cedars D, LPN Outcome: Progressing 02/01/2021 1009 by Netta Cedars D, LPN Outcome: Progressing   Problem: Clinical Measurements: Goal: Ability to maintain clinical measurements within normal limits will improve 02/01/2021 1009 by Netta Cedars D, LPN Outcome: Progressing 02/01/2021 1009 by Netta Cedars D, LPN Outcome: Progressing Goal: Will remain free from infection 02/01/2021 1009 by Netta Cedars D, LPN Outcome: Progressing 02/01/2021 1009 by Netta Cedars D, LPN Outcome: Progressing Goal: Diagnostic test results will improve 02/01/2021 1009 by Netta Cedars D, LPN Outcome: Progressing 02/01/2021 1009 by Netta Cedars D, LPN Outcome: Progressing Goal: Respiratory complications will improve 02/01/2021 1009 by Netta Cedars D, LPN Outcome: Progressing 02/01/2021 1009 by Netta Cedars D, LPN Outcome: Progressing Goal: Cardiovascular complication will be avoided 02/01/2021 1009 by Netta Cedars D, LPN Outcome: Progressing 02/01/2021 1009 by Netta Cedars D, LPN Outcome: Progressing   Problem: Activity: Goal: Risk for activity intolerance will decrease 02/01/2021 1009 by Netta Cedars D, LPN Outcome: Progressing 02/01/2021 1009 by Netta Cedars D, LPN Outcome: Progressing   Problem: Nutrition: Goal: Adequate nutrition will be maintained 02/01/2021 1009 by Netta Cedars D, LPN Outcome: Progressing 02/01/2021 1009 by Netta Cedars D, LPN Outcome: Progressing   Problem: Coping: Goal: Level of anxiety will decrease 02/01/2021 1009 by Netta Cedars D, LPN Outcome:  Progressing 02/01/2021 1009 by Netta Cedars D, LPN Outcome: Progressing   Problem: Elimination: Goal: Will not experience complications related to bowel motility 02/01/2021 1009 by Netta Cedars D, LPN Outcome: Progressing 02/01/2021 1009 by Netta Cedars D, LPN Outcome: Progressing Goal: Will not experience complications related to urinary retention 02/01/2021 1009 by Netta Cedars D, LPN Outcome: Progressing 02/01/2021 1009 by Netta Cedars D, LPN Outcome: Progressing   Problem: Pain Managment: Goal: General experience of comfort will improve 02/01/2021 1009 by Netta Cedars D, LPN Outcome: Progressing 02/01/2021 1009 by Netta Cedars D, LPN Outcome: Progressing   Problem: Safety: Goal: Ability to remain free from injury will improve 02/01/2021 1009 by Netta Cedars D, LPN Outcome: Progressing 02/01/2021 1009 by Netta Cedars D, LPN Outcome: Progressing   Problem: Skin Integrity: Goal: Risk for impaired skin integrity will decrease 02/01/2021 1009 by Netta Cedars D, LPN Outcome: Progressing 02/01/2021 1009 by Netta Cedars D, LPN Outcome: Progressing   Problem: Safety: Goal: Non-violent Restraint(s) 02/01/2021 1009 by Netta Cedars D, LPN Outcome: Progressing 02/01/2021 1009 by Netta Cedars D, LPN Outcome: Progressing   Problem: Education: Goal: Knowledge of disease or condition will improve 02/01/2021 1009 by Netta Cedars D, LPN Outcome: Progressing 02/01/2021 1009 by Netta Cedars D, LPN Outcome: Progressing Goal: Knowledge of secondary prevention will improve 02/01/2021 1009 by Netta Cedars D, LPN Outcome: Progressing 02/01/2021 1009 by Netta Cedars D, LPN Outcome: Progressing Goal: Knowledge of patient specific risk factors addressed and post discharge goals established will improve 02/01/2021 1009 by Netta Cedars D, LPN Outcome: Progressing 02/01/2021 1009 by Netta Cedars D, LPN Outcome: Progressing   Problem: Self-Care: Goal: Verbalization of feelings and concerns over difficulty with self-care  will improve 02/01/2021 1009 by Netta Cedars D, LPN Outcome: Progressing 02/01/2021 1009 by Netta Cedars D, LPN Outcome: Progressing   Problem: Nutrition: Goal: Dietary intake will improve 02/01/2021 1009 by Mercy Riding, LPN  Outcome: Progressing 02/01/2021 1009 by Netta Cedars D, LPN Outcome: Progressing

## 2021-02-01 NOTE — Progress Notes (Addendum)
Physical Therapy Treatment Patient Details Name: [name] MRN: [id number] DOB: 1961-02-04 Today's Date: 02/01/2021    History of Present Illness 60 year old white male who presented to the emergency room from home 12/10/20.  Pt had 4-5 days of N/V/D. Pt became more confused and at times belligerent. Admitted for hypoxemic respiratory failure and septic shock 2/2 E. Coli bacteremia and UTI and was intubated. Extubated 4/16, now on cortrak. dense LUE paresis; CT head "late subacute to chronic infarct within the  posterior right frontal lobe"    PT Comments    Noted pt able to dangle EOB and even attempt standing 5/22! Decided to forego tilting and attempt same today. Unfortunately his BP continued to decr throughout his time sitting EOB with pt becoming more symptomatic and ultimately diaphoretic prior to return to supine. Lumbar brace and TED hose in use with gradual incr HOB to 45 prior to pivot to dangle at side of bed. Will continue efforts to strengthen and mobilize pt.    Orthostatic BPs HOB 45 degrees 112/72  HR 72  Dangle EOB 101/64   103  Mild dizziness  Sit EOB after 4 min 94/60      102  Mild dizziness  Sit EOB after 20 min 96/55      105  Significant dizziness, diaphoretic     Follow Up Recommendations  SNF     Equipment Recommendations  Wheelchair (measurements PT);Hospital bed    Recommendations for Other Services       Precautions / Restrictions Precautions Precautions: Fall;Other (comment) Required Braces or Orthoses: Spinal Brace Spinal Brace: Lumbar corset;Applied in supine position (lumbar corset for back pain and orthostasis) Other Brace: TED hose for orthostasis Restrictions Weight Bearing Restrictions: No    Mobility  Bed Mobility Overal bed mobility: Needs Assistance Bed Mobility: Rolling Rolling: Min assist Sidelying to sit: Min assist;+2 for safety/equipment (pt on air mattress)     Sit to sidelying: Min assist;+2 for safety/equipment General  bed mobility comments: Assist to manage LUE in rolling, side to sit; with makeshift sling returned to supine +1 min assist for his legs    Transfers                 General transfer comment: unable to attempt as pt becoming diaphoretic as his BP dropped while sitting EOB  Ambulation/Gait             General Gait Details: unable   Stairs             Wheelchair Mobility    Modified Rankin (Stroke Patients Only)       Balance Overall balance assessment: Needs assistance Sitting-balance support: No upper extremity supported Sitting balance-Leahy Scale: Fair Sitting balance - Comments: Sat EOB x 20 minutes with supervision while assessing BP and symptoms of orthostasis                                    Cognition Arousal/Alertness: Awake/alert Behavior During Therapy: WFL for tasks assessed/performed Overall Cognitive Status: Impaired/Different from baseline Area of Impairment: Memory;Safety/judgement                     Memory: Decreased short-term memory   Safety/Judgement: Decreased awareness of deficits            Exercises      General Comments        Pertinent Vitals/Pain Pain Assessment: Faces Faces Pain  Scale: Hurts little more Pain Location: back Pain Descriptors / Indicators: Aching Pain Intervention(s): Premedicated before session    Home Living                      Prior Function            PT Goals (current goals can now be found in the care plan section) Acute Rehab PT Goals Patient Stated Goal: to go home Time For Goal Achievement: 02/14/21 Potential to Achieve Goals: Fair Progress towards PT goals: Progressing toward goals    Frequency    Min 3X/week      PT Plan Current plan remains appropriate    Co-evaluation              AM-PAC PT "6 Clicks" Mobility   Outcome Measure  Help needed turning from your back to your side while in a flat bed without using bedrails?: A  Little Help needed moving from lying on your back to sitting on the side of a flat bed without using bedrails?: A Little Help needed moving to and from a bed to a chair (including a wheelchair)?: Total Help needed standing up from a chair using your arms (e.g., wheelchair or bedside chair)?: Total Help needed to walk in hospital room?: Total Help needed climbing 3-5 steps with a railing? : Total 6 Click Score: 10    End of Session   Activity Tolerance: Treatment limited secondary to medical complications (Comment) (symptomatic hypotension) Patient left: in bed;with call bell/phone within reach   PT Visit Diagnosis: Muscle weakness (generalized) (M62.81);Difficulty in walking, not elsewhere classified (R26.2);Other abnormalities of gait and mobility (R26.89)     Time: 5176-1607 PT Time Calculation (min) (ACUTE ONLY): 35 min  Charges:  $Therapeutic Activity: 23-37 mins                      [name], PT Pager [phone]    [name] 02/01/2021, 12:59 PM

## 2021-02-02 ENCOUNTER — Inpatient Hospital Stay: Payer: Self-pay

## 2021-02-02 DIAGNOSIS — I82B12 Acute embolism and thrombosis of left subclavian vein: Secondary | ICD-10-CM

## 2021-02-02 DIAGNOSIS — L89313 Pressure ulcer of right buttock, stage 3: Secondary | ICD-10-CM

## 2021-02-02 DIAGNOSIS — Z2821 Immunization not carried out because of patient refusal: Secondary | ICD-10-CM

## 2021-02-02 DIAGNOSIS — M549 Dorsalgia, unspecified: Secondary | ICD-10-CM

## 2021-02-02 DIAGNOSIS — I10 Essential (primary) hypertension: Secondary | ICD-10-CM

## 2021-02-02 LAB — RESP PANEL BY RT-PCR (FLU A&B, COVID) ARPGX2
Influenza A by PCR: NEGATIVE
Influenza B by PCR: NEGATIVE
SARS Coronavirus 2 by RT PCR: NEGATIVE

## 2021-02-02 LAB — GLUCOSE, CAPILLARY
Glucose-Capillary: 123 mg/dL — ABNORMAL HIGH (ref 70–99)
Glucose-Capillary: 133 mg/dL — ABNORMAL HIGH (ref 70–99)

## 2021-02-02 MED ORDER — QUETIAPINE FUMARATE 50 MG PO TABS: 50.0000 mg | ORAL_TABLET | Freq: Every day | ORAL | Status: AC

## 2021-02-02 MED ORDER — ASPIRIN 81 MG PO CHEW: 81.0000 mg | CHEWABLE_TABLET | Freq: Every day | ORAL | Status: AC

## 2021-02-02 MED ORDER — DULOXETINE HCL 30 MG PO CPEP: 30.0000 mg | ORAL_CAPSULE | Freq: Every day | ORAL | 3 refills | Status: AC

## 2021-02-02 MED ORDER — CHLORHEXIDINE GLUCONATE CLOTH 2 % EX PADS: 6.0000 | MEDICATED_PAD | Freq: Every day | CUTANEOUS | Status: DC

## 2021-02-02 MED ORDER — APIXABAN 5 MG PO TABS
5.0000 mg | ORAL_TABLET | Freq: Two times a day (BID) | ORAL | Status: AC
Start: 2021-02-02 — End: ?

## 2021-02-02 MED ORDER — SODIUM CHLORIDE 0.9 % IV SOLN: 9.0000 mg/kg | Freq: Every day | INTRAVENOUS | 0 refills | Status: AC

## 2021-02-02 MED ORDER — LIDOCAINE 5 % EX PTCH: 1.0000 | MEDICATED_PATCH | CUTANEOUS | 0 refills | Status: AC

## 2021-02-02 MED ORDER — IPRATROPIUM-ALBUTEROL 0.5-2.5 (3) MG/3ML IN SOLN: 3.0000 mL | RESPIRATORY_TRACT | Status: AC | PRN

## 2021-02-02 MED ORDER — SODIUM CHLORIDE 0.9% FLUSH: 10.0000 mL | Freq: Two times a day (BID) | INTRAVENOUS | Status: DC

## 2021-02-02 MED ORDER — OXYCODONE HCL ER 20 MG PO T12A: 20.0000 mg | EXTENDED_RELEASE_TABLET | Freq: Two times a day (BID) | ORAL | 0 refills | Status: AC

## 2021-02-02 MED ORDER — ADULT MULTIVITAMIN W/MINERALS CH: 1.0000 | ORAL_TABLET | Freq: Every day | ORAL | Status: AC

## 2021-02-02 MED ORDER — POLYETHYLENE GLYCOL 3350 17 G PO PACK: 17.0000 g | PACK | Freq: Every day | ORAL | 0 refills | Status: AC

## 2021-02-02 MED ORDER — METHOCARBAMOL 500 MG PO TABS: 1000.0000 mg | ORAL_TABLET | Freq: Three times a day (TID) | ORAL | Status: AC

## 2021-02-02 MED ORDER — DICLOFENAC SODIUM 1 % EX GEL: 2.0000 g | Freq: Four times a day (QID) | CUTANEOUS | Status: AC

## 2021-02-02 MED ORDER — ACETAMINOPHEN 500 MG PO TABS: 500.0000 mg | ORAL_TABLET | Freq: Four times a day (QID) | ORAL | 0 refills | Status: AC | PRN

## 2021-02-02 MED ORDER — INSULIN ASPART 100 UNIT/ML IJ SOLN: 0.0000 [IU] | Freq: Every day | INTRAMUSCULAR | 11 refills | Status: AC

## 2021-02-02 MED ORDER — OXYCODONE HCL 10 MG PO TABS: 10.0000 mg | ORAL_TABLET | ORAL | 0 refills | Status: AC | PRN

## 2021-02-02 MED ORDER — CARVEDILOL 3.125 MG PO TABS: 3.1250 mg | ORAL_TABLET | Freq: Two times a day (BID) | ORAL | Status: AC

## 2021-02-02 MED ORDER — ENSURE ENLIVE PO LIQD: 237.0000 mL | Freq: Two times a day (BID) | ORAL | 12 refills | Status: AC

## 2021-02-02 MED ORDER — PANTOPRAZOLE SODIUM 40 MG PO TBEC
40.0000 mg | DELAYED_RELEASE_TABLET | Freq: Two times a day (BID) | ORAL | Status: AC
Start: 2021-02-02 — End: ?

## 2021-02-02 MED ORDER — INSULIN ASPART 100 UNIT/ML IJ SOLN: 0.0000 [IU] | Freq: Three times a day (TID) | INTRAMUSCULAR | 11 refills | Status: AC

## 2021-02-02 MED ORDER — CYANOCOBALAMIN 100 MCG PO TABS: 100.0000 ug | ORAL_TABLET | Freq: Every day | ORAL | Status: AC

## 2021-02-02 MED ORDER — DOCUSATE SODIUM 50 MG/5ML PO LIQD: 50.0000 mg | Freq: Every day | ORAL | 0 refills | Status: AC

## 2021-02-02 MED ORDER — SODIUM CHLORIDE 0.9% FLUSH: 10.0000 mL | INTRAVENOUS | Status: DC | PRN

## 2021-02-02 NOTE — Progress Notes (Signed)
Physical Therapy Treatment Patient Details Name: [name] MRN: [id number] DOB: 06-Dec-1960 Today's Date: 02/02/2021    History of Present Illness 60 year old white male who presented to the emergency room from home 12/10/20.  Pt had 4-5 days of N/V/D. Pt became more confused and at times belligerent. Admitted for hypoxemic respiratory failure and septic shock 2/2 E. Coli bacteremia and UTI and was intubated. Extubated 4/16, now on cortrak. dense LUE paresis; CT head "late subacute to chronic infarct within the  posterior right frontal lobe"    PT Comments    Patient eager to attempt sitting EOB and standing with stedy lift. Unfortunately, upon sitting EOB pt with increased back pain despite use of lumbar orthosis. BP remained stable as elevating HOB in 15 degree increments prior to pivoting to sit EOB. Sat EOB ~4 minutes with hopes of pain subsiding enough to try standing and ultimately pt had to return to supine because of pain, not due to BP. Pt disappointed as he really wants to stand.     Follow Up Recommendations  SNF     Equipment Recommendations  Wheelchair (measurements PT);Hospital bed    Recommendations for Other Services       Precautions / Restrictions Precautions Precautions: Fall;Other (comment) Required Braces or Orthoses: Spinal Brace Spinal Brace: Lumbar corset;Applied in supine position (lumbar corset for back pain and orthostasis) Other Brace: TED hose for orthostasis    Mobility  Bed Mobility Overal bed mobility: Needs Assistance Bed Mobility: Rolling Rolling: Supervision Sidelying to sit: Min guard;HOB elevated (pt on air mattress using rail)   Sit to supine: Supervision   General bed mobility comments: Assist to manage LUE in rolling, side to sit;    Transfers                 General transfer comment: unable due to back pain 10/10 with sit EOB  Ambulation/Gait             General Gait Details: unable   Stairs              Wheelchair Mobility    Modified Rankin (Stroke Patients Only)       Balance Overall balance assessment: Needs assistance Sitting-balance support: No upper extremity supported Sitting balance-Leahy Scale: Fair                                      Cognition Arousal/Alertness: Awake/alert Behavior During Therapy: WFL for tasks assessed/performed Overall Cognitive Status: Impaired/Different from baseline Area of Impairment: Memory;Safety/judgement                     Memory: Decreased short-term memory   Safety/Judgement: Decreased awareness of deficits     General Comments: often confuses PT with his NP and asks about changing his medications (has been explained numerous times the difference)      Exercises      General Comments        Pertinent Vitals/Pain Pain Assessment: Faces Faces Pain Scale: Hurts worst Pain Location: back Pain Descriptors / Indicators: Aching Pain Intervention(s): Limited activity within patient's tolerance;Monitored during session;Premedicated before session;Repositioned    Home Living                      Prior Function            PT Goals (current goals can now be found in the care plan  section) Acute Rehab PT Goals Patient Stated Goal: to go home Time For Goal Achievement: 02/14/21 Potential to Achieve Goals: Fair Progress towards PT goals: Not progressing toward goals - comment (due to pain this session)    Frequency    Min 3X/week      PT Plan Current plan remains appropriate    Co-evaluation              AM-PAC PT "6 Clicks" Mobility   Outcome Measure  Help needed turning from your back to your side while in a flat bed without using bedrails?: A Little Help needed moving from lying on your back to sitting on the side of a flat bed without using bedrails?: A Little Help needed moving to and from a bed to a chair (including a wheelchair)?: Total Help needed standing up from  a chair using your arms (e.g., wheelchair or bedside chair)?: Total Help needed to walk in hospital room?: Total Help needed climbing 3-5 steps with a railing? : Total 6 Click Score: 10    End of Session   Activity Tolerance: Patient limited by pain Patient left: in bed;with call bell/phone within reach   PT Visit Diagnosis: Muscle weakness (generalized) (M62.81);Difficulty in walking, not elsewhere classified (R26.2);Other abnormalities of gait and mobility (R26.89)     Time: 1010-1102 PT Time Calculation (min) (ACUTE ONLY): 52 min  Charges:  $Therapeutic Activity: 38-52 mins                      Jerolyn Center, PT Pager [phone]    Zena Amos 02/02/2021, 12:40 PM

## 2021-02-02 NOTE — Progress Notes (Signed)
Peripherally Inserted Central Catheter Placement  The IV Nurse has discussed with the patient and/or persons authorized to consent for the patient, the purpose of this procedure and the potential benefits and risks involved with this procedure.  The benefits include less needle sticks, lab draws from the catheter, and the patient may be discharged home with the catheter. Risks include, but not limited to, infection, bleeding, blood clot (thrombus formation), and puncture of an artery; nerve damage and irregular heartbeat and possibility to perform a PICC exchange if needed/ordered by physician.  Alternatives to this procedure were also discussed.  Bard Power PICC patient education guide, fact sheet on infection prevention and patient information card has been provided to patient /or left at bedside.    PICC Placement Documentation  PICC Single Lumen 02/02/21 Right Basilic 43 cm 0 cm (Active)  Indication for Insertion or Continuance of Line Home intravenous therapies (PICC only) 02/02/21 1422  Exposed Catheter (cm) 0 cm 02/02/21 1422  Site Assessment Clean;Dry;Intact 02/02/21 1422  Line Status Flushed;Saline locked;Blood return noted 02/02/21 1422  Dressing Type Transparent;Securing device 02/02/21 1422  Dressing Status Clean;Dry;Intact 02/02/21 1422  Antimicrobial disc in place? Yes 02/02/21 1422  Dressing Intervention New dressing 02/02/21 1422  Dressing Change Due 02/09/21 02/02/21 1422       Annett Fabian 02/02/2021, 2:24 PM

## 2021-02-02 NOTE — Progress Notes (Signed)
Report called and given to Tiara at [facility].

## 2021-02-02 NOTE — Progress Notes (Signed)
Consent obtained for PICC, patient, family member and RN aware that it will be later this afternoon for placement.

## 2021-02-02 NOTE — Progress Notes (Signed)
Clarification: Last week patient's full dose Lovenox was transitioned to Eliquis.  Currently the problem list summary does not reflect this change.  The correct dose of Eliquis is listed on the discharge medications.  The physician also made clarification in his attestation statement.

## 2021-02-02 NOTE — Discharge Summary (Signed)
Physician Discharge Summary  [name] QJJ:[id number] DOB: [birth year] DOA: 12/10/2020  PCP: Pcp, No  Admit date: 12/10/2020 Discharge date: 02/02/2021  Time spent: 55 minutes  Recommendations for Outpatient Follow-up:  1. While on daptomycin he will require CK levels weekly.  Obtain 5/18 was 76. 2. He will discharge to [facility] SNF 3. He will need to follow-up with the infectious disease clinic after discharge.  An ambulatory referral has been sent. 4. He will also need to follow-up with Dr. McDiarmid of urology after discharge.  Please call number provided to arrange follow-up hospital appointment. 5. Continue PT and OT after discharge 6. Continue daily wound care applying Santyl to wound base, packing with saline moistened gauze and cover with dry dressing 7. PICC line placed on date of discharge  Discharge Diagnoses:  Principal Problem:   Severe sepsis with septic shock ([place]) Active Problems:   Acute respiratory failure ([place])   AKI (acute kidney injury) ([place])   Bacteremia due to Escherichia coli   Ureteral stone   Pyelonephritis   Cerebral thrombosis with cerebral infarction   Staphylococcus epidermidis bacteremia   Pressure injury of skin   Dysphagia, pharyngoesophageal phase   Physical deconditioning   HTN (hypertension)   Left subclavian vein thrombosis (HCC)   Decubitus ulcer of right buttock, stage 3 (HCC)   COVID-19 vaccine dose declined   Back pain  SEPSIS RESOLVED  Discharge Condition: Stable  Diet recommendation: Regular diet  Filed Weights   01/30/21 0552 01/31/21 0500 02/01/21 0500  Weight: 120.1 kg 121 kg 120.2 kg    History of present illness:  60 year old male with past medical history significant for essential hypertension, hyperlipidemia who presented to [facility] ED on 3/31 for a 5-day history of nausea/vomiting/diarrhea and confusion. Patient was initially admitted to [facility] service for acute hypoxic respiratory failure requiring intubation  and ventilatory support with septic shock secondary to E. coli septicemia, UTI, MSSA Pneumonia, MRSE bacteremia (CLABSI involving left IJ vein and subclavian vein), with possible endovascular infection. Infectious disease followed during hospital course.  Patient transferred from [facility] to [facility] on 01/01/2021 for further management of ongoing metabolic encephalopathy, tachycardia, dysphagia requiring tube feeds, intermittent fevers and prolonged antibiotic course.  He is now reporting burning and tingling in LUE  Hospital Course:  Acute problems: Acute hypoxic respiratory failure: Resolved MSSA pneumonia --Presented with hypoxia s/p mechanical ventilation due to sepsis physiology  MRSE endovascular infection/septicemia E. coli UTI/septicemia Septic shock, POA --Infectious disease following --Continue daptomycin 900 mg IV daily x 6 weeks from 4/24(~02/14/21)  --Continue to follow CK weekly while on daptomycin-most recent reading on 5/18 was 76  Acute hyponatremia with dehydration and associated recurrent orthostasis Resolved --Recurrent orthostasis suspected possibly related to pain related vagal response --Continue TED hose and consider abdominal binder-see below regarding lumbar sacral support  Recent COVID infection -- Antibodies positive 5/20 --Unfortunately after extensive counseling continues to refuse COVID vaccination  Back pain w/ activity -Continue OxyContin to 20 mg BID -Continue Oxy IR for breakthrough pain, lidocaine patch and Robaxin   Acute toxic metabolic encephalopathy/ongoing delirium/subacute vs chronic infarct with dense insensate LUE hemiparesis -Acute encephalopathy resolved --continue asa and statin  Stage III right buttock decubitus -Initially started as a deep tissue injury and has progressed to decubitus --Completed 10 days of Flagyl --Wound care RN following -continue Santyl with wound packing daily  Type 2 diabetes mellitus --Home med:  Glipizide --Hemoglobin A1c 6.8.  Mild constipation --One-time dose of milk of magnesia effective; continue MiraLAX and  Colace  Left IJ/subclavian DVT versus septic thrombus -Left IJ central line subsequently discontinued and RUE PICC line removed 4/24  --Repeat ultrasound duplex scan 4/25 with resolution of DVT. --Continue Lovenox therapeutic dose  Essential hypertension --Continue carvedilol   Recurrent pyelonephritis with obstructive hydronephrosis --s/p left ureteral stent 4/6.  --Repeat ultrasound with mild left hydronephrosis. CT abdomen with stable position of double-J stent.  Dysphagia/mild protein calorie malnutrition/morbid obesity -- Continue regular diet --Estimated body mass index is 34.02 kg/m as calculated from the following:   Height as of this encounter: _0  (1.88 m).   Weight as of this encounter: 120.2 kg.  Weakness/deconditioning/debility: --Continue PT/OT  -Utilized KREG tilt bed during hospitalization  Goals of care: --Palliative care evaluated during hospitalization    Other problems: NASH cirrhosis: Outpatient follow-up with gastroenterology  Mild hyponatremia --Resolved  Hypokalemia --Resolved  Acute renal failure -- Resolved  HLD: -- Continue atorvastatin 40 mg daily  Atypical chest pain --Suspect GI etiology -cont. Protonix initiated and encourage upright after meals for at least 30 minutes  Procedures:  [place]  Consultations:  PCCM  Urology Dr. Claudia Desanctis, Dr. Matilde Sprang  Nephrology, Dr. Jonnie Finner, Dr. Augustin Coupe  Neurology, Dr. Leonie Man  Infectious disease   Discharge Exam: Vitals:   02/02/21 0502 02/02/21 0853  BP: (!) 98/47 129/79  Pulse: 95 (!) 104  Resp: 20 20  Temp: 97.8 F (36.6 C) 97.8 F (36.6 C)  SpO2: 95% 96%   Constitutional: Alert, calm, no acute distress.  Appears to be comfortable. Respiratory: Anterior lung sounds are clear, no increased work of breathing, room air. Cardiovascular:  Normal heart sounds, regular pulse, normotensive, no peripheral edema. Abdomen:  LBM 5/22, nontender nondistended.  Eating well. Genitourinary: Condom cath in place light amber-colored urine noted Skin: Stage III decubitus on hip Neurologic:LUE hemiparesis slightly improved.  Patient with gross motor movement originating at the shoulder girth.  Reporting numbness and tingling.  Other extremities with strength 4/5. Psychiatric: Alert and oriented x3 with pleasant affect.  Discharge Instructions   Discharge Instructions    Ambulatory referral to Infectious Disease   Complete by: As directed    Anaphylaxis Kit: Provided to treat any anaphylactic reaction to the medication being provided to the patient if First Dose or when requested by physician   Complete by: As directed    Epinephrine 59m/ml vial / amp: Administer 0.386m(0.25m100msubcutaneously once for moderate to severe anaphylaxis, nurse to call physician and pharmacy when reaction occurs and call 911 if needed for immediate care   Diphenhydramine 74m60m IV vial: Administer 25-74mg2mIM PRN for first dose reaction, rash, itching, mild reaction, nurse to call physician and pharmacy when reaction occurs   Sodium Chloride 0.9% NS 500ml 36mAdminister if needed for hypovolemic blood pressure drop or as ordered by physician after call to physician with anaphylactic reaction   Change dressing on IV access line weekly and PRN   Complete by: As directed    Diet general   Complete by: As directed    Discharge wound care:   Complete by: As directed    Daily wound care to stage III right buttock decubitus as follows: Apply Santyl to wound base, packed with saline moistened gauze and cover with dry dressing.   Flush IV access with Sodium Chloride 0.9% and Heparin 10 units/ml or 100 units/ml   Complete by: As directed    Increase activity slowly   Complete by: As directed      Allergies as of 02/02/2021  Reactions   Ibuprofen Other (See  Comments)   Blood in the stool.   Metformin Other (See Comments)   Tremors, muscles  locking up, unable to control extremities      Medication List    STOP taking these medications   oxyCODONE 10 mg 12 hr tablet Commonly known as: OXYCONTIN Replaced by: Oxycodone HCl 10 MG Tabs   oxyCODONE-acetaminophen 10-325 MG tablet Commonly known as: PERCOCET   oxymetazoline 0.05 % nasal spray Commonly known as: AFRIN   rOPINIRole 1 MG tablet Commonly known as: REQUIP     TAKE these medications   acetaminophen 500 MG tablet Commonly known as: TYLENOL Take 1 tablet (500 mg total) by mouth every 6 (six) hours as needed for mild pain, fever, headache or moderate pain.   apixaban 5 MG Tabs tablet Commonly known as: ELIQUIS Take 1 tablet (5 mg total) by mouth 2 (two) times daily.   aspirin 81 MG chewable tablet Chew 1 tablet (81 mg total) by mouth daily. Start taking on: Feb 03, 2021   atorvastatin 40 MG tablet Commonly known as: LIPITOR Take 40 mg by mouth daily.   carvedilol 3.125 MG tablet Commonly known as: COREG Take 1 tablet (3.125 mg total) by mouth 2 (two) times daily with a meal.   cyanocobalamin 100 MCG tablet Take 1 tablet (100 mcg total) by mouth daily. Start taking on: Feb 03, 2021   DAPTOmycin 900 mg in sodium chloride 0.9 % 50 mL Inject 900 mg into the vein daily at 8 pm for 11 days.   diclofenac Sodium 1 % Gel Commonly known as: VOLTAREN Apply 2 g topically 4 (four) times daily.   docusate 50 MG/5ML liquid Commonly known as: COLACE Take 5 mLs (50 mg total) by mouth daily. Start taking on: Feb 03, 2021   DULoxetine 30 MG capsule Commonly known as: CYMBALTA Take 1 capsule (30 mg total) by mouth daily. Start taking on: Feb 03, 2021   feeding supplement Liqd Take 237 mLs by mouth 2 (two) times daily between meals.   glipiZIDE 10 MG tablet Commonly known as: GLUCOTROL Take 10 mg by mouth every morning.   insulin aspart 100 UNIT/ML injection Commonly  known as: novoLOG Inject 0-5 Units into the skin at bedtime. CBG < 70: Implement Hypoglycemia Standing Orders and refer to Hypoglycemia Standing Orders sidebar report  CBG 70 - 120: 0 units  CBG 121 - 150: 0 units  CBG 151 - 200: 0 units  CBG 201 - 250: 2 units  CBG 251 - 300: 3 units  CBG 301 - 350: 4 units  CBG 351 - 400: 5 units  CBG > 400 call MD and obtain STAT lab verification   insulin aspart 100 UNIT/ML injection Commonly known as: novoLOG Inject 0-9 Units into the skin 3 (three) times daily with meals. Correction coverage: Sensitive (thin, NPO, renal)  CBG < 70: Implement Hypoglycemia Standing Orders and refer to Hypoglycemia Standing Orders sidebar report  CBG 70 - 120: 0 units  CBG 121 - 150: 1 unit  CBG 151 - 200: 2 units  CBG 201 - 250: 3 units  CBG 251 - 300: 5 units  CBG 301 - 350: 7 units  CBG 351 - 400 9 units  CBG > 400 call MD and obtain STAT lab verification   ipratropium-albuterol 0.5-2.5 (3) MG/3ML Soln Commonly known as: DUONEB Take 3 mLs by nebulization every 4 (four) hours as needed.   lidocaine 5 % Commonly known as: [place]  1 patch onto the skin daily. Remove & Discard patch within 12 hours or as directed by MD   methocarbamol 500 MG tablet Commonly known as: ROBAXIN Take 2 tablets (1,000 mg total) by mouth 4 (four) times daily -  before meals and at bedtime.   multivitamin with minerals Tabs tablet Take 1 tablet by mouth daily. Start taking on: Feb 03, 2021   Oxycodone HCl 10 MG Tabs Take 1 tablet (10 mg total) by mouth every 4 (four) hours as needed for breakthrough pain. Replaces: oxyCODONE 10 mg 12 hr tablet   oxyCODONE 20 mg 12 hr tablet Commonly known as: OXYCONTIN Take 1 tablet (20 mg total) by mouth every 12 (twelve) hours for 7 days.   pantoprazole 40 MG tablet Commonly known as: PROTONIX Take 1 tablet (40 mg total) by mouth 2 (two) times daily before a meal.   polyethylene glycol 17 g packet Commonly known as: MIRALAX /  GLYCOLAX Take 17 g by mouth daily. Start taking on: Feb 03, 2021   QUEtiapine 50 MG tablet Commonly known as: SEROQUEL Take 1 tablet (50 mg total) by mouth at bedtime.            Discharge Care Instructions  (From admission, onward)         Start     Ordered   02/02/21 0000  Change dressing on IV access line weekly and PRN  (Home infusion instructions - [facility] )        02/02/21 1044   02/02/21 0000  Discharge wound care:       Comments: Daily wound care to stage III right buttock decubitus as follows: Apply Santyl to wound base, packed with saline moistened gauze and cover with dry dressing.   02/02/21 1308         Allergies  Allergen Reactions  . Ibuprofen Other (See Comments)    Blood in the stool.  . Metformin Other (See Comments)    Tremors, muscles  locking up, unable to control extremities    Follow-up Information    [facility]             . Schedule an appointment as soon as possible for a visit.   Why: Ambulatory referral sent prior to DC Contact information: [address] [place] [address]       McDiarmid, Blane Ohara, MD. Call.   Specialty: Family Medicine Why: Please call to arrange hospital FU appointment Contact information: [place] [address] [phone]                The results of significant diagnostics from this hospitalization (including imaging, microbiology, ancillary and laboratory) are listed below for reference.    Significant Diagnostic Studies: CT ABDOMEN PELVIS WO CONTRAST  Result Date: 01/05/2021 CLINICAL DATA:  Ureteral stent displacement. EXAM: CT ABDOMEN AND PELVIS WITHOUT CONTRAST TECHNIQUE: Multidetector CT imaging of the abdomen and pelvis was performed following the standard protocol without IV contrast. COMPARISON:  CT of the abdomen and pelvis 12/23/2020 FINDINGS: Lower chest: Bibasilar airspace disease is improved.  No significant effusion or pneumothorax is present. Heart size is normal. NG tube is in place. Hepatobiliary: No focal liver abnormality is seen. No gallstones, gallbladder wall thickening, or biliary dilatation. Pancreas: Unremarkable. No pancreatic ductal dilatation or surrounding inflammatory changes. Spleen: Normal in size without focal abnormality. Adrenals/Urinary Tract: Adrenal glands are normal bilaterally. Right kidney is within normal limits. Ureters unremarkable. Double-J  ureteral stent is stable in position in the left kidney. 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 Foley catheter is present in the bladder. Bladder is otherwise decompressed. Stomach/Bowel: Stomach is unremarkable. Duodenum is normal. Small bowel is within normal limits. Terminal ileum is within normal limits. The appendix is not discretely visualized and may be surgically absent. Contrast is present throughout the colon. Colon is unremarkable. Vascular/Lymphatic: Atherosclerotic calcifications are present in the aorta and branch vessels. Distal aortic aneurysm measuring up to 5.0 cm again noted. Proximal left iliac artery aneurysm measuring 4.2 cm noted. No aneurysm is present a right iliac artery. No additional aortic aneurysm is present. Reproductive: Prostate is unremarkable. Other: No abdominal wall hernia or abnormality. No abdominopelvic ascites. Musculoskeletal: Vertebral body heights maintained. No significant listhesis is present. Levoconvex scoliosis stable. No focal lytic or blastic lesions are present. Hips are located and within normal limits. IMPRESSION: 1. Double-J ureteral stent is stable in position in the left kidney. 2. 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 3. 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 4. 5.0 cm distal abdominal aortic aneurysm. Recommend follow-up CT/MR every 6 months and vascular consultation. This recommendation  follows ACR consensus guidelines: White Paper of the ACR Incidental Findings Committee II on Vascular Findings. J Am Coll Radiol 2013; 10:789-794. 5. Improved bibasilar airspace disease. 6. Aortic Atherosclerosis (ICD10-I70.0). Electronically Signed   By: San Morelle M.D.   On: 01/05/2021 13:04   DG Abd 1 View  Result Date: 01/07/2021 CLINICAL DATA:  NG tube placement. EXAM: ABDOMEN - 1 VIEW COMPARISON:  CT 01/05/2021. FINDINGS: Interim removal of NG tube and placement of feeding tube. Feeding tube tip noted over the stomach. Double-J left ureteral stent noted. Oral contrast in the colon. No bowel distention. Bibasilar atelectasis. IMPRESSION: Feeding tube noted with tip over stomach. Electronically Signed   By: Marcello Moores  Register   On: 01/07/2021 15:06   CT HEAD WO CONTRAST  Result Date: 01/05/2021 CLINICAL DATA:  Delirium.  Altered mental status. EXAM: CT HEAD WITHOUT CONTRAST TECHNIQUE: Contiguous axial images were obtained from the base of the skull through the vertex without intravenous contrast. COMPARISON:  MRI/MRA head 12/22/2020. FINDINGS: Brain: Mildly motion degraded exam. 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 There is no acute intracranial hemorrhage. No demarcated cortical infarct. No extra-axial fluid collection. No evidence of intracranial mass. No midline shift. Vascular: No hyperdense vessel.  Atherosclerotic calcifications Skull: Normal. Negative for fracture or focal lesion. Sinuses/Orbits: Visualized orbits show no acute finding. Postsurgical appearance of the paranasal sinuses. Mucosal thickening and frothy secretions within the left frontal sinus. Mild mucosal thickening within the bilateral ethmoidectomy cavities. Near complete opacification of the sphenoid sinuses. Mild-to-moderate mucosal thickening, small volume frothy secretions and small mucous retention cysts within the right  maxillary sinus. Mild mucosal thickening and small mucous retention cysts within the left maxillary sinus. Other: Bilateral mastoid effusions.  Nasoenteric tube. IMPRESSION: Mildly motion degraded examination. No evidence of acute intracranial abnormality. 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 Paranasal sinus disease, as described. Bilateral mastoid effusions. Electronically Signed   By: Kellie Simmering DO   On: 01/05/2021 10:17   DG Chest Port 1 View  Result Date: 01/07/2021 CLINICAL DATA:  Shortness of breath and altered mental status. EXAM: PORTABLE CHEST 1 VIEW COMPARISON:  January 03, 2021. FINDINGS: Low lung volumes. Similar patchy opacities in the right midlung and streaky bibasilar opacities. Suspected small right pleural effusion given fluid tracking along the fissure. No visible pneumothorax on this single AP semi erect radiograph. Similar cardiac silhouette. Gastric tube courses below the diaphragm with the tip outside the field of view. IMPRESSION: 1. No substantial change in patchy airspace opacities in the right midlung and streaky opacities at the bases, which could represent atelectasis, aspiration, and/or pneumonia. 2. Suspected small right pleural effusion given small volume of fluid tracking along the right fissure. Dedicated PA and lateral radiographs could better characterize if clinically indicated. Electronically Signed   By: Margaretha Sheffield MD   On: 01/07/2021 08:56   VAS [place] LOWER EXTREMITY VENOUS (DVT)  Result Date: 01/05/2021  Lower Venous DVT Study Patient Name:  [name]  Date of Exam:   01/04/2021 Medical Rec #: [id number]     Accession #:    [id number] Date of Birth: Mar 16, 1961    Patient Gender: M Patient Age:   51Y Exam Location[facility]  [facility] Procedure:      VAS [place] LOWER EXTREMITY VENOUS (DVT) Referring Phys: Mignon Pine  --------------------------------------------------------------------------------  Indications: Recurrent fevers.  Limitations: Body habitus, poor ultrasound/tissue interface and poor patient cooperation/movement. Comparison Study: 12/20/2020 and 12/15/2020- negative lower extremtiy venous                   duplexes Performing Technologist: Maudry Mayhew MHA, RDMS, RVT, RDCS  Examination Guidelines: A complete evaluation includes B-mode imaging, spectral Doppler, color Doppler, and power Doppler as needed of all accessible portions of each vessel. Bilateral testing is considered an integral part of a complete examination. Limited examinations for reoccurring indications may be performed as noted. The reflux portion of the exam is performed with the patient in reverse Trendelenburg.  +---------+---------------+---------+-----------+----------+--------------+ RIGHT    CompressibilityPhasicitySpontaneityPropertiesThrombus Aging +---------+---------------+---------+-----------+----------+--------------+ CFV                     Yes      Yes                                 +---------+---------------+---------+-----------+----------+--------------+ SFJ                              Yes                                 +---------+---------------+---------+-----------+----------+--------------+ FV Prox  Full                                                        +---------+---------------+---------+-----------+----------+--------------+ FV Mid   Full                                                        +---------+---------------+---------+-----------+----------+--------------+ FV DistalFull                                                        +---------+---------------+---------+-----------+----------+--------------+  POP      Full           Yes      Yes                                 +---------+---------------+---------+-----------+----------+--------------+ PTV       Full                                                        +---------+---------------+---------+-----------+----------+--------------+ PERO     Full                                                        +---------+---------------+---------+-----------+----------+--------------+ VV       None                                         Acute          +---------+---------------+---------+-----------+----------+--------------+ Unable to perform compression maneuver at groin level due to poor patient cooperation.  Right Technical Findings: Not visualized segments include PFV.  +---------+---------------+---------+-----------+----------+--------------+ LEFT     CompressibilityPhasicitySpontaneityPropertiesThrombus Aging +---------+---------------+---------+-----------+----------+--------------+ CFV      Full           Yes      Yes                                 +---------+---------------+---------+-----------+----------+--------------+ SFJ      Full                                                        +---------+---------------+---------+-----------+----------+--------------+ FV Prox  Full                                                        +---------+---------------+---------+-----------+----------+--------------+ FV Mid   Full                                                        +---------+---------------+---------+-----------+----------+--------------+ FV DistalFull                                                        +---------+---------------+---------+-----------+----------+--------------+ POP      Full           Yes      Yes                                 +---------+---------------+---------+-----------+----------+--------------+  PTV      Full                                                        +---------+---------------+---------+-----------+----------+--------------+ PERO     Full                                                         +---------+---------------+---------+-----------+----------+--------------+   Left Technical Findings: Not visualized segments include PFV.   Summary: RIGHT: - There is no evidence of deep vein thrombosis in the lower extremity. However, portions of this examination were limited- see technologist comments above. Evidence of acute superficial vein thrombosis involving a varicose vein of the mid medial right calf.  - No cystic structure found in the popliteal fossa.  LEFT: - There is no evidence of deep vein thrombosis in the lower extremity. However, portions of this examination were limited- see technologist comments above.  - No cystic structure found in the popliteal fossa.  *See table(s) above for measurements and observations. Electronically signed by Deitra Mayo MD on 01/05/2021 at 7:30:16 AM.    Final    VAS [place] UPPER EXTREMITY VENOUS DUPLEX  Result Date: 01/05/2021 UPPER VENOUS STUDY  Patient Name:  [name]  Date of Exam:   01/04/2021 Medical Rec #: [id number]     Accession #:    [id number] Date of Birth: Jul 20, 1961    Patient Gender: M Patient Age:   59Y Exam Location:  [facility] Procedure:      VAS [place] UPPER EXTREMITY VENOUS DUPLEX Referring Phys: Mignon Pine --------------------------------------------------------------------------------  Indications: Recurrent fevers Limitations: Body habitus, poor ultrasound/tissue interface and poor patient cooperation/movement. Comparison Study: 12/25/2020- left IJV and subclavian vein DVT Performing Technologist: Maudry Mayhew MHA, RDMS, RVT, RDCS  Examination Guidelines: A complete evaluation includes B-mode imaging, spectral Doppler, color Doppler, and power Doppler as needed of all accessible portions of each vessel. Bilateral testing is considered an integral part of a complete examination. Limited examinations for reoccurring indications may be performed as noted.  Right Findings:  +----------+------------+---------+-----------+----------+--------------+ RIGHT     CompressiblePhasicitySpontaneousProperties   Summary     +----------+------------+---------+-----------+----------+--------------+ IJV                                                 Not visualized +----------+------------+---------+-----------+----------+--------------+ Subclavian    Full       Yes       Yes                             +----------+------------+---------+-----------+----------+--------------+ Axillary      Full       Yes       Yes                             +----------+------------+---------+-----------+----------+--------------+ Brachial      Full       Yes       Yes                             +----------+------------+---------+-----------+----------+--------------+  Radial        Full                                                 +----------+------------+---------+-----------+----------+--------------+ Ulnar         Full                                                 +----------+------------+---------+-----------+----------+--------------+ Cephalic      Full                                                 +----------+------------+---------+-----------+----------+--------------+ Basilic                                             Not visualized +----------+------------+---------+-----------+----------+--------------+  Left Findings: +----------+------------+---------+-----------+----------+--------------+ LEFT      CompressiblePhasicitySpontaneousProperties   Summary     +----------+------------+---------+-----------+----------+--------------+ IJV           Full                                                 +----------+------------+---------+-----------+----------+--------------+ Subclavian    Full       Yes       Yes                             +----------+------------+---------+-----------+----------+--------------+ Axillary                  Yes       Yes                             +----------+------------+---------+-----------+----------+--------------+ Brachial      Full       Yes       Yes                             +----------+------------+---------+-----------+----------+--------------+ Radial        Full                                                 +----------+------------+---------+-----------+----------+--------------+ Ulnar                                               Not visualized +----------+------------+---------+-----------+----------+--------------+ Cephalic  Not visualized +----------+------------+---------+-----------+----------+--------------+ Basilic                                             Not visualized +----------+------------+---------+-----------+----------+--------------+ Unable to perform compression maneuvers on some segments due to technical limitations.  Summary:  Bilateral: Study was technically limited as described above. No obvious evidence of DVT or superficial vein thrombosis involving visualized veins of bilateral upper extremities. When compared to prior study, previous DVT in left IJV and subclavian veins appear to have resolved.  *See table(s) above for measurements and observations.  Diagnosing physician: Deitra Mayo MD Electronically signed by Deitra Mayo MD on 01/05/2021 at 7:31:09 AM.    Final    ECHOCARDIOGRAM LIMITED  Result Date: 01/04/2021    ECHOCARDIOGRAM LIMITED REPORT   Patient Name:   [name] Date of Exam: 01/04/2021 Medical Rec #:  [id number]    Height:       74.0 in Accession #:    [id number]   Weight:       265.9 lb Date of Birth:  Jan 15, 1961   BSA:          2.453 m Patient Age:    52 years     BP:           143/78 mmHg Patient Gender: M            HR:           112 bpm. Exam Location:  Inpatient Procedure: 2D Echo, Cardiac Doppler and Color Doppler Indications:     Bacteremia  History:        Patient has prior history of Echocardiogram examinations, most                 recent 12/25/2020. Risk Factors:Hypertension and Former Smoker.  Sonographer:    Cammy Brochure Referring Phys: [id number] [place]  1. Mild intracavitary gradient. Peak velocity 1.93 m/s. Peak gradient 15 mmHg. Left ventricular ejection fraction, by estimation, is 60 to 65%. The left ventricle has normal function. The left ventricle has no regional wall motion abnormalities.  2. Right ventricular systolic function is normal. The right ventricular size is normal. There is normal pulmonary artery systolic pressure.  3. The mitral valve is normal in structure. No evidence of mitral valve regurgitation. No evidence of mitral stenosis.  4. The aortic valve is normal in structure. There is mild calcification of the aortic valve. No aortic stenosis is present.  5. The inferior vena cava is normal in size with greater than 50% respiratory variability, suggesting right atrial pressure of 3 mmHg. FINDINGS  Left Ventricle: Mild intracavitary gradient. Peak velocity 1.93 m/s. Peak gradient 15 mmHg. Left ventricular ejection fraction, by estimation, is 60 to 65%. The left ventricle has normal function. The left ventricle has no regional wall motion abnormalities. The left ventricular internal cavity size was normal in size. There is no left ventricular hypertrophy. Right Ventricle: The right ventricular size is normal. No increase in right ventricular wall thickness. Right ventricular systolic function is normal. There is normal pulmonary artery systolic pressure. The tricuspid regurgitant velocity is 1.64 m/s, and  with an assumed right atrial pressure of 3 mmHg, the estimated right ventricular systolic pressure is 78.6 mmHg. Left Atrium: Left atrial size was normal in size. Right Atrium: Right atrial size was normal in size. Pericardium: There is no evidence of pericardial  effusion. Mitral Valve: The  mitral valve is normal in structure. No evidence of mitral valve stenosis. Tricuspid Valve: The tricuspid valve is normal in structure. Tricuspid valve regurgitation is trivial. No evidence of tricuspid stenosis. Aortic Valve: The aortic valve is normal in structure. There is mild calcification of the aortic valve. No aortic stenosis is present. Aortic valve mean gradient measures 8.0 mmHg. Aortic valve peak gradient measures 14.7 mmHg. Pulmonic Valve: The pulmonic valve was normal in structure. Pulmonic valve regurgitation is not visualized. No evidence of pulmonic stenosis. Aorta: The aortic root is normal in size and structure. Venous: The inferior vena cava is normal in size with greater than 50% respiratory variability, suggesting right atrial pressure of 3 mmHg. IAS/Shunts: No atrial level shunt detected by color flow Doppler. AORTIC VALVE AV Vmax:           191.50 cm/s AV Vmean:          133.500 cm/s AV VTI:            0.238 m AV Peak Grad:      14.7 mmHg AV Mean Grad:      8.0 mmHg LVOT Vmax:         149.00 cm/s LVOT Vmean:        94.600 cm/s LVOT VTI:          0.198 m LVOT/AV VTI ratio: 0.83 TRICUSPID VALVE TR Peak grad:   10.8 mmHg TR Vmax:        164.00 cm/s  SHUNTS Systemic VTI: 0.20 m Skeet Latch MD Electronically signed by Skeet Latch MD Signature Date/Time: 01/04/2021/3:07:29 PM    Final    [place] EKG SITE RITE  Result Date: 02/02/2021 If Site Rite image not attached, placement could not be confirmed due to current cardiac rhythm.   Microbiology: Recent Results (from the past 240 hour(s))  Resp Panel by RT-PCR (Flu A&B, Covid) Nasopharyngeal Swab     Status: None   Collection Time: 02/02/21  8:58 AM   Specimen: Nasopharyngeal Swab; Nasopharyngeal(NP) swabs in vial transport medium  Result Value Ref Range Status   SARS Coronavirus 2 by RT PCR NEGATIVE NEGATIVE Final    Comment: (NOTE) SARS-CoV-2 target nucleic acids are NOT DETECTED.  The SARS-CoV-2 RNA is generally detectable in  upper respiratory specimens during the acute phase of infection. The lowest concentration of SARS-CoV-2 viral copies this assay can detect is 138 copies/mL. A negative result does not preclude SARS-Cov-2 infection and should not be used as the sole basis for treatment or other patient management decisions. A negative result may occur with  improper specimen collection/handling, submission of specimen other than nasopharyngeal swab, presence of viral mutation(s) within the areas targeted by this assay, and inadequate number of viral copies(<138 copies/mL). A negative result must be combined with clinical observations, patient history, and epidemiological information. The expected result is Negative.  Fact Sheet for Patients:  EntrepreneurPulse.com.au  Fact Sheet for Healthcare Providers:  IncredibleEmployment.be  This test is no t yet approved or cleared by the [place] FDA and  has been authorized for detection and/or diagnosis of SARS-CoV-2 by FDA under an Emergency Use Authorization (EUA). This EUA will remain  in effect (meaning this test can be used) for the duration of the COVID-19 declaration under Section 564(b)(1) of the Act, 21 U.S.C.section 360bbb-3(b)(1), unless the authorization is terminated  or revoked sooner.       Influenza A by PCR NEGATIVE NEGATIVE Final   Influenza B by PCR NEGATIVE NEGATIVE  Final    Comment: (NOTE) The Xpert Xpress SARS-CoV-2/FLU/RSV plus assay is intended as an aid in the diagnosis of influenza from Nasopharyngeal swab specimens and should not be used as a sole basis for treatment. Nasal washings and aspirates are unacceptable for Xpert Xpress SARS-CoV-2/FLU/RSV testing.  Fact Sheet for Patients: EntrepreneurPulse.com.au  Fact Sheet for Healthcare Providers: IncredibleEmployment.be  This test is not yet approved or cleared by the [place] FDA and has been  authorized for detection and/or diagnosis of SARS-CoV-2 by FDA under an Emergency Use Authorization (EUA). This EUA will remain in effect (meaning this test can be used) for the duration of the COVID-19 declaration under Section 564(b)(1) of the Act, 21 U.S.C. section 360bbb-3(b)(1), unless the authorization is terminated or revoked.  Performed at [facility], [place] [address] [place], [place] [address]      Labs: Basic Metabolic Panel: Recent Labs  Lab 02/01/21 0401  NA 136  K 3.7  CL 98  CO2 31  GLUCOSE 107*  BUN 14  CREATININE 0.59*  CALCIUM 9.0   Liver Function Tests: Recent Labs  Lab 02/01/21 0401  AST 23  ALT 19  ALKPHOS 93  BILITOT 0.6  PROT 6.6  ALBUMIN 1.9*   No results for input(s): LIPASE, AMYLASE in the last 168 hours. No results for input(s): AMMONIA in the last 168 hours. CBC: Recent Labs  Lab 02/01/21 0401  WBC 7.2  HGB 9.7*  HCT 30.6*  MCV 94.4  PLT 359   Cardiac Enzymes: Recent Labs  Lab 01/27/21 0457  CKTOTAL 76   BNP: BNP (last 3 results) No results for input(s): BNP in the last 8760 hours.  ProBNP (last 3 results) No results for input(s): PROBNP in the last 8760 hours.  CBG: Recent Labs  Lab 02/01/21 1208 02/01/21 1600 02/01/21 2236 02/02/21 0735 02/02/21 1131  GLUCAP 122* 114* 126* 123* 133*       Signed:  Erin Hearing ANP [facility] 02/02/2021, 1:09 PM

## 2021-02-02 NOTE — Plan of Care (Signed)
  Problem: Education: Goal: Knowledge of General Education information will improve Description: Including pain rating scale, medication(s)/side effects and non-pharmacologic comfort measures Outcome: Progressing   Problem: Health Behavior/Discharge Planning: Goal: Ability to manage health-related needs will improve Outcome: Progressing   Problem: Clinical Measurements: Goal: Ability to maintain clinical measurements within normal limits will improve Outcome: Progressing Goal: Will remain free from infection Outcome: Progressing Goal: Diagnostic test results will improve Outcome: Progressing Goal: Respiratory complications will improve Outcome: Progressing Goal: Cardiovascular complication will be avoided Outcome: Progressing   Problem: Activity: Goal: Risk for activity intolerance will decrease Outcome: Progressing   Problem: Nutrition: Goal: Adequate nutrition will be maintained Outcome: Progressing   Problem: Coping: Goal: Level of anxiety will decrease Outcome: Progressing   Problem: Elimination: Goal: Will not experience complications related to bowel motility Outcome: Progressing Goal: Will not experience complications related to urinary retention Outcome: Progressing   Problem: Pain Managment: Goal: General experience of comfort will improve Outcome: Progressing   Problem: Safety: Goal: Ability to remain free from injury will improve Outcome: Progressing   Problem: Skin Integrity: Goal: Risk for impaired skin integrity will decrease Outcome: Progressing   Problem: Safety: Goal: Non-violent Restraint(s) Outcome: Progressing   Problem: Education: Goal: Knowledge of disease or condition will improve Outcome: Progressing Goal: Knowledge of secondary prevention will improve Outcome: Progressing Goal: Knowledge of patient specific risk factors addressed and post discharge goals established will improve Outcome: Progressing   Problem: Self-Care: Goal:  Verbalization of feelings and concerns over difficulty with self-care will improve Outcome: Progressing   Problem: Nutrition: Goal: Dietary intake will improve Outcome: Progressing

## 2021-02-02 NOTE — Progress Notes (Addendum)
2:35pm: CSW called [facility] for first available pick up.  10:30am: Patient will go to room 108E-B at [facility]. Patient will be transported via [facility] - [facility] to be called once patient receives PICC line and COVID test results. The number to call for report is [phone].  7:45am: CSW obtained patient's Medicaid number - #[id number] M.  CSW spoke with Rasheema at [facility] who states patient can come to the facility while still receiving his IV antibiotics. CSW completed LOG request form and sent it to [facility] director for approval.  CSW spoke with patient's wife [name] to inform her of discharge plan - CSW provided [name] with contact information and address of facility.  Edwin Dada, MSW, LCSW Transitions of Care  Clinical Social Worker II [phone]

## 2021-02-02 NOTE — Plan of Care (Signed)
  Problem: Education: Goal: Knowledge of General Education information will improve Description: Including pain rating scale, medication(s)/side effects and non-pharmacologic comfort measures Outcome: Adequate for Discharge   Problem: Health Behavior/Discharge Planning: Goal: Ability to manage health-related needs will improve Outcome: Adequate for Discharge   Problem: Clinical Measurements: Goal: Ability to maintain clinical measurements within normal limits will improve Outcome: Adequate for Discharge Goal: Will remain free from infection Outcome: Adequate for Discharge Goal: Diagnostic test results will improve Outcome: Adequate for Discharge Goal: Respiratory complications will improve Outcome: Adequate for Discharge Goal: Cardiovascular complication will be avoided Outcome: Adequate for Discharge   Problem: Activity: Goal: Risk for activity intolerance will decrease Outcome: Adequate for Discharge   Problem: Nutrition: Goal: Adequate nutrition will be maintained Outcome: Adequate for Discharge   Problem: Coping: Goal: Level of anxiety will decrease Outcome: Adequate for Discharge   Problem: Elimination: Goal: Will not experience complications related to bowel motility Outcome: Adequate for Discharge Goal: Will not experience complications related to urinary retention Outcome: Adequate for Discharge   Problem: Pain Managment: Goal: General experience of comfort will improve Outcome: Adequate for Discharge   Problem: Safety: Goal: Ability to remain free from injury will improve Outcome: Adequate for Discharge   Problem: Skin Integrity: Goal: Risk for impaired skin integrity will decrease Outcome: Adequate for Discharge   Problem: Safety: Goal: Non-violent Restraint(s) Outcome: Adequate for Discharge   Problem: Education: Goal: Knowledge of disease or condition will improve Outcome: Adequate for Discharge Goal: Knowledge of secondary prevention will  improve Outcome: Adequate for Discharge Goal: Knowledge of patient specific risk factors addressed and post discharge goals established will improve Outcome: Adequate for Discharge   Problem: Self-Care: Goal: Verbalization of feelings and concerns over difficulty with self-care will improve Outcome: Adequate for Discharge   Problem: Nutrition: Goal: Dietary intake will improve Outcome: Adequate for Discharge

## 2021-02-19 ENCOUNTER — Ambulatory Visit: Payer: Self-pay | Admitting: Infectious Diseases

## 2021-03-02 DIAGNOSIS — U071 COVID-19: Secondary | ICD-10-CM | POA: Insufficient documentation

## 2021-03-03 ENCOUNTER — Other Ambulatory Visit: Payer: Self-pay

## 2021-03-03 ENCOUNTER — Ambulatory Visit (INDEPENDENT_AMBULATORY_CARE_PROVIDER_SITE_OTHER): Payer: Self-pay | Admitting: Internal Medicine

## 2021-03-03 DIAGNOSIS — N201 Calculus of ureter: Secondary | ICD-10-CM

## 2021-03-03 DIAGNOSIS — B957 Other staphylococcus as the cause of diseases classified elsewhere: Secondary | ICD-10-CM

## 2021-03-03 DIAGNOSIS — B9620 Unspecified Escherichia coli [E. coli] as the cause of diseases classified elsewhere: Secondary | ICD-10-CM

## 2021-03-03 DIAGNOSIS — R7881 Bacteremia: Secondary | ICD-10-CM

## 2021-03-03 DIAGNOSIS — U071 COVID-19: Secondary | ICD-10-CM

## 2021-03-03 NOTE — Assessment & Plan Note (Signed)
It appears that his E. coli infection has been cured.

## 2021-03-03 NOTE — Assessment & Plan Note (Signed)
He seems to be recovering uneventfully from his COVID-19 infection.

## 2021-03-03 NOTE — Assessment & Plan Note (Signed)
I will make a referral to urology.  He needs to follow-up with Dr. Lorin Picket McDermid.

## 2021-03-03 NOTE — Assessment & Plan Note (Signed)
I believe he has coag negative staph bacteremia and septic thrombophlebitis have been cured.  His PICC line was removed here today.

## 2021-03-03 NOTE — Progress Notes (Signed)
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   APIXABAN (ELIQUIS) 5 MG TABS TABLET    Take 1 tablet (5 mg total) by mouth 2 (two) times daily.   ASPIRIN 81 MG CHEWABLE TABLET    Chew 1 tablet (81 mg total) by mouth daily.   ATORVASTATIN (LIPITOR) 40 MG TABLET    Take 40 mg by mouth daily.   CARVEDILOL (COREG) 3.125 MG TABLET    Take 1 tablet (3.125 mg total) by mouth 2 (two) times daily with a meal.   DICLOFENAC SODIUM (VOLTAREN) 1 % GEL    Apply 2 g topically 4 (four) times daily.   DOCUSATE (COLACE) 50 MG/5ML LIQUID    Take 5 mLs (50 mg total) by mouth daily.   DULOXETINE (CYMBALTA) 30 MG CAPSULE    Take 1 capsule (30 mg total) by mouth daily.   FEEDING SUPPLEMENT (ENSURE ENLIVE / ENSURE PLUS) LIQD    Take 237 mLs by mouth 2 (two) times daily between meals.   GLIPIZIDE (GLUCOTROL) 10 MG TABLET    Take 10 mg by mouth every morning.   INSULIN ASPART (NOVOLOG) 100 UNIT/ML INJECTION    Inject  0-5 Units into the skin at bedtime. CBG < 70: Implement Hypoglycemia Standing Orders and refer to Hypoglycemia Standing Orders sidebar report  CBG 70 - 120: 0 units  CBG 121 - 150: 0 units  CBG 151 - 200: 0 units  CBG 201 - 250: 2 units  CBG 251 - 300: 3 units  CBG 301 - 350: 4 units  CBG 351 - 400: 5 units  CBG > 400 call MD and obtain STAT lab verification   INSULIN ASPART (NOVOLOG) 100 UNIT/ML INJECTION    Inject 0-9 Units into the skin 3 (three) times daily with meals. Correction coverage: Sensitive (thin, NPO, renal)  CBG < 70: Implement Hypoglycemia Standing Orders and refer to Hypoglycemia Standing Orders sidebar report  CBG 70 - 120: 0 units  CBG 121 - 150: 1 unit  CBG 151 - 200: 2 units  CBG 201 - 250: 3 units  CBG 251 - 300: 5 units  CBG 301 - 350: 7 units  CBG 351 - 400 9 units  CBG > 400 call MD and obtain STAT lab verification   IPRATROPIUM-ALBUTEROL (DUONEB) 0.5-2.5 (3) MG/3ML SOLN    Take 3 mLs by nebulization every 4 (four) hours as  needed.   LIDOCAINE (LIDODERM) 5 %    Place 1 patch onto the skin daily. Remove & Discard patch within 12 hours or as directed by MD   METHOCARBAMOL (ROBAXIN) 500 MG TABLET    Take 2 tablets (1,000 mg total) by mouth 4 (four) times daily -  before meals and at bedtime.   MULTIPLE VITAMIN (MULTIVITAMIN WITH MINERALS) TABS TABLET    Take 1 tablet by mouth daily.   OXYCODONE 10 MG TABS    Take 1 tablet (10 mg total) by mouth every 4 (four) hours as needed for breakthrough pain.   PANTOPRAZOLE (PROTONIX) 40 MG TABLET    Take 1 tablet (40 mg total) by mouth 2 (two) times daily before a meal.   POLYETHYLENE GLYCOL (MIRALAX / GLYCOLAX) 17 G PACKET    Take 17 g by mouth daily.   QUETIAPINE (SEROQUEL) 50 MG TABLET    Take 1 tablet (50 mg total) by mouth at bedtime.   VITAMIN B-12 100 MCG TABLET    Take 1 tablet (100 mcg total) by mouth daily.  Modified Medications   No medications on file  Discontinued Medications   No medications on file     Subjective: [name] is in with his wife for his hospital follow-up visit.  He was admitted to the hospital on 12/24/2020 with sepsis.  He was found to have an obstructing ureteral stone complicated by a bacteremic E. coli UTI.  He was also found to have bacteremia with methicillin-resistant coagulase-negative staph.  He had a long complicated hospitalization before being discharged to [facility] nursing home on 02/02/2021.  He developed septic thrombophlebitis involving his left subclavian and internal jugular veins.  His PICC was replaced and my partner recommended continuing IV daptomycin for 6 weeks after PICC replacement.  He completed therapy on 02/14/2021.  Unfortunately, his PICC line remains in.  He completed therapy for his E. coli infection while hospitalized.  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  He developed COVID-19 infection and was diagnosed on 01/29/2021.  He received remdesivir and steroids.  He developed a right buttock pressure sore while hospitalized.  He is now feeling much better.  Review of Systems: Review of Systems  Constitutional:  Positive for malaise/fatigue and weight loss. Negative for chills, diaphoresis and fever.  Respiratory:  Positive for shortness of breath. Negative for cough.   Cardiovascular:  Negative for chest pain.  Gastrointestinal:  Negative for abdominal pain, diarrhea, nausea and vomiting.  Genitourinary:  Negative for dysuria.  Neurological:  Positive for focal weakness.   No past medical history on file.  Social History   Tobacco Use   Smoking status: Passive Smoke Exposure - Never Smoker   Smokeless tobacco: Never  Substance Use Topics   Alcohol use: Not Currently   Drug use: Not Currently    No family history on file.  Allergies  Allergen Reactions   Ibuprofen Other (See Comments)    Blood in the stool.   Metformin Other (See Comments)    Tremors, muscles  locking up, unable to control extremities     Objective: Vitals:   03/03/21 1006  BP: (!) 154/102  Pulse: (!) 104  Temp: 97.8 F (36.6 C)  TempSrc: Oral   There is no height or weight on file to calculate BMI.  Physical Exam Constitutional:      Comments: He is in good spirits.  He is seated in a wheelchair.  He has very little recall of his  hospitalization.  Cardiovascular:     Rate and Rhythm: Normal rate and regular rhythm.     Heart sounds: No murmur heard. Pulmonary:     Effort: Pulmonary effort is normal.     Breath sounds: Normal breath sounds.  Abdominal:     Palpations: Abdomen is soft.     Tenderness: There is no abdominal tenderness.  Skin:    Comments: His wife has pictures of his right buttock wound.  It is much smaller and now only about a quarter in size at the opening.  She says she is able to pack the wound about an inch and a half deep.  There is no surrounding cellulitis or fluctuance.  Neurological:     Comments: His left arm is in a sling.  Psychiatric:        Mood and Affect: Mood normal.         Problem List Items Addressed This Visit       High   Bacteremia due to Escherichia coli    It appears that his E. coli infection has been cured.       Staphylococcus epidermidis bacteremia    I believe he has coag negative staph bacteremia and septic thrombophlebitis have been cured.  His PICC line was removed here today.       COVID-19    He seems to be recovering uneventfully from his COVID-19 infection.         Unprioritized   Ureteral stone    I will make a referral to urology.  He needs to follow-up with Dr. Lorin Picket McDermid.       Relevant Orders   Ambulatory referral to Urology     Cliffton Asters, MD [facility] Infectious Disease [facility] [phone] pager   [phone] cell 03/03/2021, 10:33 AM

## 2021-03-04 NOTE — Progress Notes (Signed)
Per verbal order from Dr Orvan Falconer, 43 cm Single Lumen Peripherally Inserted Central Catheter removed from right basilic, tip intact. No sutures present. RN confirmed length per chart. Dressing was dirty and the tape was peeling off at edges. Patient reinforced the bottom of his PICC dressing with large bandaid, stating that "I figured it was probably important that it needed to stay covered."  The central tegaderm patch was still intact and covering insertion site. PICC insertion site covered by biopatch, is without redness or discharge.    Patient was resident at [facility] for approximately 4 weeks, was discharged to home 02/25/21 with PICC in place but no home health set up and no PICC care instruction. Patient states that the PICC dressing was only changed once time during his 4 weeks at [place].   RN cleansed area with CHG swabs x 3. PICC had small amount of biofilm covering approximately 6 cm of the line (from 5-11 cm marking points). PICC line was clotted off, as it had not been accessed in 6+ days.  Petroleum dressing applied. Pt advised no heavy lifting with this arm, leave dressing for 24 hours and call the office or seek emergent care if dressing becomes soaked with blood, swelling, or sharp pain presents. Patient verbalized understanding and agreement.  Patient's questions answered to their satisfaction. Patient tolerated procedure well, RN walked patient to check out. Pharmacy notified. Andree Coss, RN

## 2021-03-10 ENCOUNTER — Encounter (HOSPITAL_BASED_OUTPATIENT_CLINIC_OR_DEPARTMENT_OTHER): Payer: Medicaid Other | Attending: Internal Medicine | Admitting: Internal Medicine

## 2021-03-10 ENCOUNTER — Other Ambulatory Visit: Payer: Self-pay

## 2021-03-10 DIAGNOSIS — Z87891 Personal history of nicotine dependence: Secondary | ICD-10-CM | POA: Diagnosis not present

## 2021-03-10 DIAGNOSIS — L89313 Pressure ulcer of right buttock, stage 3: Secondary | ICD-10-CM | POA: Diagnosis not present

## 2021-03-10 DIAGNOSIS — E11622 Type 2 diabetes mellitus with other skin ulcer: Secondary | ICD-10-CM | POA: Diagnosis present

## 2021-03-10 NOTE — Progress Notes (Signed)
[name], [name] ([id number]) Visit Report for 03/10/2021 Chief Complaint Document Details Patient Name: Date of Service: [name], [name] LV IN 03/10/2021 7:30 [name] M Medical Record Number: [id number] Patient Account Number: [id number] Date of Birth/Sex: Treating RN: [birth year]/Fletcher/[name] (60 y.o. Male) Zenaida Deed Primary Care Provider: PCP, NO Other Clinician: Referring Provider: Treating Provider/Extender: Valentino Saxon in Treatment: 0 Information Obtained from: Patient Chief Complaint 03/10/2021; patient is here for review of her pressure ulcer on the right buttock Electronic Signature(s) Signed: 03/10/2021 4:58:19 PM By: Baltazar Najjar MD Entered By: Baltazar Najjar on 03/10/2021 09:15:42 -------------------------------------------------------------------------------- HPI Details Patient Name: Date of Service: [name] LV IN 03/10/2021 7:30 [name] M Medical Record Number: [id number] Patient Account Number: [id number] Date of Birth/Sex: Treating RN: [birth year] [name] (59 y.o. Male) Zenaida Deed Primary Care Provider: PCP, NO Other Clinician: Referring Provider: Treating Provider/Extender: Valentino Saxon in Treatment: 0 History of Present Illness HPI Description: ADMISSION 03/10/2021 This is [name] 30 year old man who had became acutely ill largely as [name] result of urosepsis with delirium and had [name] prolonged hospitalization from 12/10/2020 through 02/02/2021. In brief he was septic with septic shock with E. coli bacteremia. He required intubation. Developed MSSA pneumonia and later in the course septic thrombophlebitis and coag negative staph sepsis. 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 During the course of his hospitalization he developed [name] right buttock pressure ulcer which was defined in the discharge problem list. He was subsequently sent to [facility] and was returned home 2 weeks ago. They do not have any insurance [Medicaid pending] in spite of this based on the picture on the  patient's cell phone they have done an amazing job with getting this wound to look smaller. There is still [name] tunneling area. The patient is not incontinent eating and drinking well. His wife changing the dressing 3 times [name] day. Other past medical history includes type 2 diabetes, hypertension, coronary artery disease, 5 cm abdominal aortic aneurysm and he had [name] ureteral stent placed on 12/16/2020 in the early part of his hospitalization Electronic Signature(s) Signed: 03/10/2021 4:58:19 PM By: Baltazar Najjar MD Entered By: Baltazar Najjar on 03/10/2021 09:18:00 -------------------------------------------------------------------------------- Physical Exam Details Patient Name: Date of Service: [name], [name] LV IN 03/10/2021 7:30 [name] M Medical Record Number: [id number] Patient Account Number: [id number] Date of Birth/Sex: Treating RN: [birth year] (59 y.o. Male) Zenaida Deed Primary Care Provider: PCP, NO Other Clinician: Referring Provider: Treating Provider/Extender: Valentino Saxon in Treatment: 0 Constitutional Sitting or standing Blood Pressure is within target range for patient.. Pulse regular and within target range for patient.Marland Kitchen Respirations regular, non-labored and within target range.. Temperature is normal and within the target range for the patient.Marland Kitchen Appears in no distress. Respiratory work of breathing is normal. Bilateral breath sounds are clear and equal in all lobes with no wheezes, rales or rhonchi.. Neurological Left-sided weakness. Psychiatric appears at normal baseline. Notes Wound examination; right buttock. There is [name] superficial part of this wound however considerably better than the picture the patient's wife had on her cell phone. There is 6.2 cm of tunneling depth but no palpable bone here. There is no purulence and really no identifiable drainage although his wife was fairly adamant that there is drainage that has caused her to have to change this 3 times [name] day.  No evidence of surrounding soft tissue infection Electronic Signature(s) Signed: 03/10/2021 4:58:19 PM By: Baltazar Najjar MD Entered By: Baltazar Najjar on 03/10/2021 09:18:59 -------------------------------------------------------------------------------- Physician Orders Details Patient Name: Date of Service: [name], [name] LV  IN 03/10/2021 7:30 [name] M Medical Record Number: [id number] Patient Account Number: [id number] Date of Birth/Sex: Treating RN: [name][birth year] (60 y.o. Male) Zandra AbtsLynch, Shatara Primary Care Provider: PCP, NO Other Clinician: Referring Provider: Treating Provider/Extender: Valentino Saxonobson, Jermel Artley Weeks in Treatment: 0 Verbal / Phone Orders: No Diagnosis Coding Follow-up Appointments ppointment in 1 week. - with Dr. Leanord Hawkingobson Return [name] Bathing/ Shower/ Hygiene May shower and wash wound with soap and water. - prior to dressing change Off-Loading Gel wheelchair cushion Turn and reposition every 2 hours Wound Treatment Wound #1 - Gluteus Wound Laterality: Right Cleanser: Normal Saline Discharge Instructions: Cleanse the wound with Normal Saline prior to applying [name] clean dressing using gauze sponges, not tissue or cotton balls. Cleanser: Wound Cleanser Discharge Instructions: Cleanse the wound with wound cleanser prior to applying [name] clean dressing using gauze sponges, not tissue or cotton balls. Prim Dressing: KerraCel Ag Gelling Fiber Dressing, 4x5 in (silver alginate) ary Discharge Instructions: Apply silver alginate to wound bed, lightly pack undermining Secondary Dressing: Woven Gauze Sponge, Non-Sterile 4x4 in Discharge Instructions: Apply over primary dressing as directed. Secondary Dressing: ABD Pad, 5x9 Discharge Instructions: Apply over primary dressing as directed. Secured With: 76M Medipore H Soft Cloth Surgical T 4 x 2 (in/yd) ape Discharge Instructions: Secure dressing with tape as directed. Electronic Signature(s) Signed: 03/10/2021 4:56:05 PM By: Zandra AbtsLynch, Shatara RN,  BSN Signed: 03/10/2021 4:58:19 PM By: Baltazar Najjarobson, Shateria Paternostro MD Entered By: Zandra AbtsLynch, Shatara on 03/10/2021 09:12:04 -------------------------------------------------------------------------------- Problem List Details Patient Name: Date of Service: Stephen DonathFINLEY, [name] LV IN 03/10/2021 7:30 [name] M Medical Record Number: [id number] Patient Account Number: [id number] Date of Birth/Sex: Treating RN: [name]/[birth year] (60 y.o. Male) Zenaida DeedBoehlein, Linda Primary Care Provider: PCP, NO Other Clinician: Referring Provider: Treating Provider/Extender: Valentino Saxonobson, Isamar Nazir Weeks in Treatment: 0 Active Problems ICD-10 Encounter Code Description Active Date MDM Diagnosis L89.313 Pressure ulcer of right buttock, stage 3 03/10/2021 No Yes Inactive Problems Resolved Problems Electronic Signature(s) Signed: 03/10/2021 4:58:19 PM By: Baltazar Najjarobson, Milam Allbaugh MD Entered By: Baltazar Najjarobson, Aikeem Lilley on 03/10/2021 09:14:33 -------------------------------------------------------------------------------- Progress Note Details Patient Name: Date of Service: Stephen DonathFINLEY, [name] LV IN 03/10/2021 7:30 [name] M Medical Record Number: [id number] Patient Account Number: [id number] Date of Birth/Sex: Treating RN: [name]/[birth year] (59 y.o. Male) Zenaida DeedBoehlein, Linda Primary Care Provider: PCP, NO Other Clinician: Referring Provider: Treating Provider/Extender: Valentino Saxonobson, [place] Grayer Weeks in Treatment: 0 Subjective Chief Complaint Information obtained from Patient 03/10/2021; patient is here for review of her pressure ulcer on the right buttock History of Present Illness (HPI) ADMISSION 03/10/2021 This is [name] 89 year old man who had became acutely ill largely as [name] result of urosepsis with delirium and had [name] prolonged hospitalization from 12/10/2020 through 02/02/2021. In brief he was septic with septic shock with E. coli bacteremia. He required intubation. Developed MSSA pneumonia and later in the course septic thrombophlebitis and coag negative staph sepsis. He also suffered [name] right CVA with  left-sided weakness. During the course of his hospitalization he developed [name] right buttock pressure ulcer which was defined in the discharge problem list. [facility] and was returned home 2 weeks ago. They do not have any insurance [Medicaid pending] in spite of this based on the picture on the patient's cell phone they have done an amazing job with getting this wound to look smaller. There is still [name] tunneling area. The patient is not incontinent eating and drinking well. His wife changing the dressing 3 times [name] day. Other past medical history includes type 2 diabetes, hypertension, coronary artery disease, 5 cm abdominal aortic aneurysm  and he had [name] ureteral stent placed on 12/16/2020 in the early part of his hospitalization Patient History Information obtained from Patient. Allergies No Known Allergies Family History Unknown History. Social History Former smoker, Alcohol Use - Never, Drug Use - No History, Caffeine Use - Moderate. Medical History Ear/Nose/Mouth/Throat Denies history of Chronic sinus problems/congestion, Middle ear problems Hematologic/Lymphatic Denies history of Anemia, Hemophilia, Human Immunodeficiency Virus, Lymphedema, Sickle Cell Disease Respiratory Denies history of Aspiration, Asthma, Chronic Obstructive Pulmonary Disease (COPD), Pneumothorax, Sleep Apnea, Tuberculosis Cardiovascular Patient has history of Coronary Artery Disease - chronic vasc hear disease, Hypertension Denies history of Angina, Arrhythmia, Congestive Heart Failure, Deep Vein Thrombosis, Hypotension, Myocardial Infarction, Peripheral Arterial Disease, Peripheral Venous Disease, Phlebitis, Vasculitis Gastrointestinal Denies history of Cirrhosis , Colitis, Crohnoos, Hepatitis [name], Hepatitis B, Hepatitis C Endocrine Patient has history of Type II Diabetes Denies history of Type I Diabetes Genitourinary Denies history of End Stage Renal Disease Immunological Denies  history of Lupus Erythematosus, Raynaudoos, Scleroderma Integumentary (Skin) Denies history of History of Burn Musculoskeletal Patient has history of Osteoarthritis - arthritis Denies history of Gout, Rheumatoid Arthritis, Osteomyelitis Neurologic Denies history of Dementia, Neuropathy, Quadriplegia, Paraplegia, Seizure Disorder Oncologic Denies history of Received Chemotherapy, Received Radiation Psychiatric Denies history of Anorexia/bulimia, Confinement Anxiety Hospitalization/Surgery History - IandD Left knee. - Hernia operation. - nasal polyps surgeries. Medical [name] Surgical History Notes nd Cardiovascular Left subclavian vein thromb. Genitourinary hx kidney stones and uteral stentin Review of Systems (ROS) Constitutional Symptoms (General Health) Denies complaints or symptoms of Fatigue, Fever, Chills, Marked Weight Change. Eyes Denies complaints or symptoms of Dry Eyes, Vision Changes, Glasses / Contacts. Ear/Nose/Mouth/Throat Denies complaints or symptoms of Chronic sinus problems or rhinitis. Respiratory Denies complaints or symptoms of Chronic or frequent coughs, Shortness of Breath. Cardiovascular Denies complaints or symptoms of Chest pain. Gastrointestinal Denies complaints or symptoms of Frequent diarrhea, Nausea, Vomiting. Endocrine Denies complaints or symptoms of Heat/cold intolerance. Genitourinary Denies complaints or symptoms of Frequent urination. Integumentary (Skin) Complains or has symptoms of Wounds. Musculoskeletal Denies complaints or symptoms of Muscle Pain, Muscle Weakness. Neurologic Denies complaints or symptoms of Numbness/parasthesias. Psychiatric Denies complaints or symptoms of Claustrophobia, Suicidal. Objective Constitutional Sitting or standing Blood Pressure is within target range for patient.. Pulse regular and within target range for patient.Marland Kitchen Respirations regular, non-labored and within target range.. Temperature is normal and  within the target range for the patient.Marland Kitchen Appears in no distress. Vitals Time Taken: 8:04 AM, Height: 73 in, Source: Stated, Weight: 248 lbs, Source: Stated, BMI: 32.7, Temperature: 98.7 F, Pulse: 103 bpm, Respiratory Rate: 17 breaths/min, Blood Pressure: 125/88 mmHg, Capillary Blood Glucose: 174 mg/dl. Respiratory work of breathing is normal. Bilateral breath sounds are clear and equal in all lobes with no wheezes, rales or rhonchi.. Neurological Left-sided weakness. Psychiatric appears at normal baseline. General Notes: Wound examination; right buttock. There is [name] superficial part of this wound however considerably better than the picture the patient's wife had on her cell phone. There is 6.2 cm of tunneling depth but no palpable bone here. There is no purulence and really no identifiable drainage although his wife was fairly adamant that there is drainage that has caused her to have to change this 3 times [name] day. No evidence of surrounding soft tissue infection Integumentary (Hair, Skin) Wound #1 status is Open. Original cause of wound was Pressure Injury. The date acquired was: 12/11/2020. The wound is located on the Right Gluteus. The wound measures 3.4cm length x 1.5cm width x 5.5cm depth; 4.006cm^2 area  and 22.03cm^3 volume. There is Fat Layer (Subcutaneous Tissue) exposed. There is no tunneling noted, however, there is undermining starting at 10:00 and ending at 12:00 with [name] maximum distance of 7.5cm. There is [name] large amount of serosanguineous drainage noted. The wound margin is distinct with the outline attached to the wound base. There is large (67-100%) red, pink granulation within the wound bed. There is no necrotic tissue within the wound bed. Assessment Active Problems ICD-10 Pressure ulcer of right buttock, stage 3 Plan Follow-up Appointments: Return Appointment in 1 week. - with Dr. Leanord Hawking Bathing/ Shower/ Hygiene: May shower and wash wound with soap and water. - prior to  dressing change Off-Loading: Gel wheelchair cushion Turn and reposition every 2 hours WOUND #1: - Gluteus Wound Laterality: Right Cleanser: Normal Saline Discharge Instructions: Cleanse the wound with Normal Saline prior to applying [name] clean dressing using gauze sponges, not tissue or cotton balls. Cleanser: Wound Cleanser Discharge Instructions: Cleanse the wound with wound cleanser prior to applying [name] clean dressing using gauze sponges, not tissue or cotton balls. Prim Dressing: KerraCel Ag Gelling Fiber Dressing, 4x5 in (silver alginate) ary Discharge Instructions: Apply silver alginate to wound bed, lightly pack undermining Secondary Dressing: Woven Gauze Sponge, Non-Sterile 4x4 in Discharge Instructions: Apply over primary dressing as directed. Secondary Dressing: ABD Pad, 5x9 Discharge Instructions: Apply over primary dressing as directed. Secured With: 12M Medipore H Soft Cloth Surgical T 4 x 2 (in/yd) ape Discharge Instructions: Secure dressing with tape as directed. 1. Stage III pressure ulcer on the right buttock. 2. I saw no evidence of infection no need for cultures 3. I am going to try silver alginate here we can cut this into strips and pack it in the tunneling part of the wound. I am hopeful to get down to once [name] day dressing changes and I explained this to his wife 4. Need religious offloading of this area and I explained this to the patient and his wife. 5. Fortunately no evidence of infection currently 6. No insurance precludes [name] lot of dressings wound vacs etc. Electronic Signature(s) Signed: 03/10/2021 4:58:19 PM By: Baltazar Najjar MD Entered By: Baltazar Najjar on 03/10/2021 09:20:09 -------------------------------------------------------------------------------- HxROS Details Patient Name: Date of Service: [name], [name] LV IN 03/10/2021 7:30 [name] M Medical Record Number: [id number] Patient Account Number: [id number] Date of Birth/Sex: Treating RN: [name], [name] (60 y.o.  Male) Fonnie Mu Primary Care Provider: PCP, NO Other Clinician: Referring Provider: Treating Provider/Extender: Valentino Saxon in Treatment: 0 Information Obtained From Patient Constitutional Symptoms (General Health) Complaints and Symptoms: Negative for: Fatigue; Fever; Chills; Marked Weight Change Eyes Complaints and Symptoms: Negative for: Dry Eyes; Vision Changes; Glasses / Contacts Ear/Nose/Mouth/Throat Complaints and Symptoms: Negative for: Chronic sinus problems or rhinitis Medical History: Negative for: Chronic sinus problems/congestion; Middle ear problems Respiratory Complaints and Symptoms: Negative for: Chronic or frequent coughs; Shortness of Breath Medical History: Negative for: Aspiration; Asthma; Chronic Obstructive Pulmonary Disease (COPD); Pneumothorax; Sleep Apnea; Tuberculosis Cardiovascular Complaints and Symptoms: Negative for: Chest pain Medical History: Positive for: Coronary Artery Disease - chronic vasc hear disease; Hypertension Negative for: Angina; Arrhythmia; Congestive Heart Failure; Deep Vein Thrombosis; Hypotension; Myocardial Infarction; Peripheral Arterial Disease; Peripheral Venous Disease; Phlebitis; Vasculitis Past Medical History Notes: Left subclavian vein thromb. Gastrointestinal Complaints and Symptoms: Negative for: Frequent diarrhea; Nausea; Vomiting Medical History: Negative for: Cirrhosis ; Colitis; Crohns; Hepatitis [name]; Hepatitis B; Hepatitis C Endocrine Complaints and Symptoms: Negative for: Heat/cold intolerance Medical History: Positive for: Type II Diabetes Negative for: Type I Diabetes  Genitourinary Complaints and Symptoms: Negative for: Frequent urination Medical History: Negative for: End Stage Renal Disease Past Medical History Notes: hx kidney stones and uteral stentin Integumentary (Skin) Complaints and Symptoms: Positive for: Wounds Medical History: Negative for: History of  Burn Musculoskeletal Complaints and Symptoms: Negative for: Muscle Pain; Muscle Weakness Medical History: Positive for: Osteoarthritis - arthritis Negative for: Gout; Rheumatoid Arthritis; Osteomyelitis Neurologic Complaints and Symptoms: Negative for: Numbness/parasthesias Medical History: Negative for: Dementia; Neuropathy; Quadriplegia; Paraplegia; Seizure Disorder Psychiatric Complaints and Symptoms: Negative for: Claustrophobia; Suicidal Medical History: Negative for: Anorexia/bulimia; Confinement Anxiety Hematologic/Lymphatic Medical History: Negative for: Anemia; Hemophilia; Human Immunodeficiency Virus; Lymphedema; Sickle Cell Disease Immunological Medical History: Negative for: Lupus Erythematosus; Raynauds; Scleroderma Oncologic Medical History: Negative for: Received Chemotherapy; Received Radiation Immunizations Pneumococcal Vaccine: Received Pneumococcal Vaccination: No Implantable Devices No devices added Hospitalization / Surgery History Type of Hospitalization/Surgery IandD Left knee Hernia operation nasal polyps surgeries Family and Social History Unknown History: Yes; Former smoker; Alcohol Use: Never; Drug Use: No History; Caffeine Use: Moderate; Financial Concerns: No; Food, Clothing or Shelter Needs: No; Support System Lacking: No; Transportation Concerns: No Psychologist, prison and probation services) Signed: 03/10/2021 4:58:19 PM By: Baltazar Najjar MD Signed: 03/10/2021 6:02:58 PM By: Fonnie Mu RN Entered By: Fonnie Mu on 03/10/2021 08:10:04 -------------------------------------------------------------------------------- SuperBill Details Patient Name: Date of Service: [name], [name] LV IN 03/10/2021 Medical Record Number: [id number] Patient Account Number: [id number] Date of Birth/Sex: Treating RN: [birth year] (60 y.o. Male) Zandra Abts Primary Care Provider: PCP, NO Other Clinician: Referring Provider: Treating Provider/Extender: Valentino Saxon in Treatment: 0 Diagnosis Coding ICD-10 Codes Code Description L89.313 Pressure ulcer of right buttock, stage 3 Facility Procedures CPT4 Code: [id number] Description: 99214 - WOUND CARE VISIT-LEV 4 EST PT Modifier: Quantity: 1 Physician Procedures : CPT4 Code Description Modifier [id number] WC PHYS LEVEL 3 NEW PT ICD-10 Diagnosis Description L89.313 Pressure ulcer of right buttock, stage 3 Quantity: 1 Electronic Signature(s) Signed: 03/10/2021 4:58:19 PM By: Baltazar Najjar MD Entered By: Baltazar Najjar on 03/10/2021 09:20:33

## 2021-03-10 NOTE — Progress Notes (Signed)
[name], [name] ([id number]) Visit Report for 03/10/2021 Allergy List Details Patient Name: Date of Service: [name] LV IN 03/10/2021 7:30 [name] M Medical Record Number: [id number] Patient Account Number: [id number] Date of Birth/Sex: Treating RN: Jan 15, 1961 (60 y.o. Male) Fonnie Mu Primary Care Tell Rozelle: PCP, NO Other Clinician: Referring Deanne Bedgood: Treating Bon Dowis/Extender: Valentino Saxon in Treatment: 0 Allergies Active Allergies No Known Allergies Allergy Notes Electronic Signature(s) Signed: 03/10/2021 6:02:58 PM By: Fonnie Mu RN Entered By: Fonnie Mu on 03/10/2021 08:02:17 -------------------------------------------------------------------------------- Arrival Information Details Patient Name: Date of Service: [name], [name] LV IN 03/10/2021 7:30 [name] M Medical Record Number: [id number] Patient Account Number: [id number] Date of Birth/Sex: Treating RN: [birth year] (60 y.o. Male) Fonnie Mu Primary Care Aloise Copus: PCP, NO Other Clinician: Referring Kaiyla Stahly: Treating Beola Vasallo/Extender: Valentino Saxon in Treatment: 0 Visit Information Patient Arrived: Wheel Chair Arrival Time: 08:00 Accompanied By: wife Transfer Assistance: None Patient Identification Verified: Yes Secondary Verification Process Completed: Yes Patient Requires Transmission-Based Precautions: No Patient Has Alerts: No Electronic Signature(s) Signed: 03/10/2021 6:02:58 PM By: Fonnie Mu RN Entered By: Fonnie Mu on 03/10/2021 08:01:47 -------------------------------------------------------------------------------- Clinic Level of Care Assessment Details Patient Name: Date of Service: [name], [name] LV IN 03/10/2021 7:30 [name] M Medical Record Number: [id number] Patient Account Number: [id number] Date of Birth/Sex: Treating RN: December 14, 1960 (60 y.o. Male) Zandra Abts Primary Care Mylik Pro: PCP, NO Other Clinician: Referring Shaneequa Bahner: Treating Hayven Fatima/Extender: Valentino Saxon in Treatment: 0 Clinic Level of Care Assessment Items TOOL 2 Quantity Score X- 1 0 Use when only an EandM is performed on the INITIAL visit ASSESSMENTS - Nursing Assessment / Reassessment X- 1 20 General Physical Exam (combine w/ comprehensive assessment (listed just below) when performed on new pt. evals) X- 1 25 Comprehensive Assessment (HX, ROS, Risk Assessments, Wounds Hx, etc.) ASSESSMENTS - Wound and Skin [name] ssessment / Reassessment X - Simple Wound Assessment / Reassessment - one wound 1 5 []  - 0 Complex Wound Assessment / Reassessment - multiple wounds []  - 0 Dermatologic / Skin Assessment (not related to wound area) ASSESSMENTS - Ostomy and/or Continence Assessment and Care []  - 0 Incontinence Assessment and Management []  - 0 Ostomy Care Assessment and Management (repouching, etc.) PROCESS - Coordination of Care X - Simple Patient / Family Education for ongoing care 1 15 []  - 0 Complex (extensive) Patient / Family Education for ongoing care X- 1 10 Staff obtains Consents, Records, T Results / Process Orders est []  - 0 Staff telephones HHA, Nursing Homes / Clarify orders / etc []  - 0 Routine Transfer to another Facility (non-emergent condition) []  - 0 Routine Hospital Admission (non-emergent condition) X- 1 15 New Admissions / / Ordering NPWT Apligraf, etc. , []  - 0 Emergency Hospital Admission (emergent condition) X- 1 10 Simple Discharge Coordination []  - 0 Complex (extensive) Discharge Coordination PROCESS - Special Needs []  - 0 Pediatric / Minor Patient Management []  - 0 Isolation Patient Management []  - 0 Hearing / Language / Visual special needs []  - 0 Assessment of Community assistance (transportation, D/C planning, etc.) []  - 0 Additional assistance / Altered mentation []  - 0 Support Surface(s) Assessment (bed, cushion, seat, etc.) INTERVENTIONS - Wound Cleansing / Measurement X- 1 5 Wound Imaging  (photographs - any number of wounds) []  - 0 Wound Tracing (instead of photographs) X- 1 5 Simple Wound Measurement - one wound []  - 0 Complex Wound Measurement - multiple wounds X- 1 5 Simple Wound Cleansing - one wound []  - 0 Complex Wound Cleansing - multiple  wounds INTERVENTIONS - Wound Dressings []  - 0 Small Wound Dressing one or multiple wounds X- 1 15 Medium Wound Dressing one or multiple wounds []  - 0 Large Wound Dressing one or multiple wounds []  - 0 Application of Medications - injection INTERVENTIONS - Miscellaneous []  - 0 External ear exam []  - 0 Specimen Collection (cultures, biopsies, blood, body fluids, etc.) []  - 0 Specimen(s) / Culture(s) sent or taken to Lab for analysis []  - 0 Patient Transfer (multiple staff / Lift / Similar devices) []  - 0 Simple Staple / Suture removal (25 or less) []  - 0 Complex Staple / Suture removal (26 or more) []  - 0 Hypo / Hyperglycemic Management (close monitor of Blood Glucose) []  - 0 Ankle / Brachial Index (ABI) - do not check if billed separately Has the patient been seen at the hospital within the last three years: Yes Total Score: 130 Level Of Care: New/Established - Level 4 Electronic Signature(s) Signed: 03/10/2021 4:56:05 PM By: RN, BSN Entered By: on 03/10/2021 09:06:58 -------------------------------------------------------------------------------- Lower Extremity Assessment Details Patient Name: Date of Service: [name], [name] LV IN 03/10/2021 7:30 [name] M Medical Record Number: Michiel Sites Patient Account Number: Date of Birth/Sex: Treating RN: 1961/05/15 (60 y.o. Male) Primary Care Advay Volante: PCP, NO Other Clinician: Referring Tiki Tucciarone: Treating Eulas Schweitzer/Extender: 03/12/2021 in Treatment: 0 Electronic Signature(s) Signed: 03/10/2021 6:02:58 PM By: Zandra Abts RN Entered By: 03/12/2021 on 03/10/2021  08:13:47 -------------------------------------------------------------------------------- Multi Wound Chart Details Patient Name: Date of Service: 03/12/2021 LV IN 03/10/2021 7:30 [name] M Medical Record Number: [id number] Patient Account Number: 08/31/1961 Date of Birth/Sex: Treating RN: 08/10/1961 (60 y.o. Male) Valentino Saxon Primary Care Liandro Thelin: PCP, NO Other Clinician: Referring Kearie Mennen: Treating Milina Pagett/Extender: 03/12/2021 in Treatment: 0 Vital Signs Height(in): 73 Capillary Blood Glucose(mg/dl): Fonnie Mu Weight(lbs): Fonnie Mu Pulse(bpm): 103 Body Mass Index(BMI): 33 Blood Pressure(mmHg): 125/88 Temperature(F): 98.7 Respiratory Rate(breaths/min): 17 Photos: [1:No Photos Right Gluteus] [N/[name]:N/[name] N/[name]] Wound Location: [1:Pressure Injury] [N/[name]:N/[name]] Wounding Event: [1:Pressure Ulcer] [N/[name]:N/[name]] Primary Etiology: [1:Coronary Artery Disease,] [N/[name]:N/[name]] Comorbid History: [1:Hypertension, Type II Diabetes, Osteoarthritis 12/11/2020] [N/[name]:N/[name]] Date Acquired: [1:0] [N/[name]:N/[name]] Weeks of Treatment: [1:Open] [N/[name]:N/[name]] Wound Status: [1:3.4x1.5x5.5] [N/[name]:N/[name]] Measurements L x W x D (cm) [1:4.006] [N/[name]:N/[name]] [name] (cm) : rea [1:22.03] [N/[name]:N/[name]] Volume (cm) : [1:10] Starting Position 1 (o'clock): [1:12] Ending Position 1 (o'clock): [1:7.5] Maximum Distance 1 (cm): [1:Yes] [N/[name]:N/[name]] Undermining: [1:Category/Stage III] [N/[name]:N/[name]] Classification: [1:Large] [N/[name]:N/[name]] Exudate [name] mount: [1:Serosanguineous] [N/[name]:N/[name]] Exudate Type: [1:red, brown] [N/[name]:N/[name]] Exudate Color: [1:Distinct, outline attached] [N/[name]:N/[name]] Wound Margin: [1:Large (67-100%)] [N/[name]:N/[name]] Granulation [name] mount: [1:Red, Pink] [N/[name]:N/[name]] Granulation Quality: [1:None Present (0%)] [N/[name]:N/[name]] Necrotic [name] mount: [1:Fat Layer (Subcutaneous Tissue): Yes N/[name]] Exposed Structures: [1:Fascia: No Tendon: No Muscle: No Joint: No Bone: No Medium (34-66%)] [N/[name]:N/[name]] Treatment Notes Electronic Signature(s) Signed: 03/10/2021 4:58:19 PM By: 03/12/2021 MD Signed: 03/10/2021 5:41:46 PM By: [id number] RN, BSN Entered By: 08/31/1961 on 03/10/2021 09:15:23 -------------------------------------------------------------------------------- Multi-Disciplinary Care Plan Details Patient Name: Date of Service: [name], [name] LV IN 03/10/2021 7:30 [name] M Medical Record Number: [id number] Patient Account Number: [id number] Date of Birth/Sex: Treating RN: 09-Mar-1961 (60 y.o. Male) Baltazar Najjar Primary Care Kyliee Ortego: PCP, NO Other Clinician: Referring Tarrell Debes: Treating Teandre Hamre/Extender: 03/12/2021 in Treatment: 0 Multidisciplinary Care Plan reviewed with physician Active Inactive Nutrition Nursing Diagnoses: Impaired glucose control: actual or potential Potential for alteratiion in Nutrition/Potential for imbalanced nutrition Goals: Patient/caregiver agrees to and verbalizes understanding of need to use nutritional supplements and/or vitamins as prescribed Date  Initiated: 03/10/2021 Target Resolution Date: 04/09/2021 Goal Status: Active Patient/caregiver will maintain therapeutic glucose control Date Initiated: 03/10/2021 Target Resolution Date: 04/09/2021 Goal Status: Active Interventions: Assess HgA1c results as ordered upon admission and as needed Assess patient nutrition upon admission and as needed per policy Provide education on elevated blood sugars and impact on wound healing Provide education on nutrition Notes: Pressure Nursing Diagnoses: Knowledge deficit related to causes and risk factors for pressure ulcer development Knowledge deficit related to management of pressures ulcers Potential for impaired tissue integrity related to pressure, friction, moisture, and shear Goals: Patient will remain free from development of additional pressure ulcers Date Initiated: 03/10/2021 Target Resolution Date: 04/09/2021 Goal Status: Active Patient/caregiver will verbalize risk factors for pressure ulcer  development Date Initiated: 03/10/2021 Target Resolution Date: 04/09/2021 Goal Status: Active Patient/caregiver will verbalize understanding of pressure ulcer management Date Initiated: 03/10/2021 Target Resolution Date: 04/09/2021 Goal Status: Active Interventions: Assess: immobility, friction, shearing, incontinence upon admission and as needed Assess offloading mechanisms upon admission and as needed Assess potential for pressure ulcer upon admission and as needed Provide education on pressure ulcers Notes: Wound/Skin Impairment Nursing Diagnoses: Impaired tissue integrity Knowledge deficit related to ulceration/compromised skin integrity Goals: Patient/caregiver will verbalize understanding of skin care regimen Date Initiated: 03/10/2021 Target Resolution Date: 04/09/2021 Goal Status: Active Ulcer/skin breakdown will have [name] volume reduction of 30% by week 4 Date Initiated: 03/10/2021 Target Resolution Date: 04/09/2021 Goal Status: Active Interventions: Assess patient/caregiver ability to obtain necessary supplies Assess patient/caregiver ability to perform ulcer/skin care regimen upon admission and as needed Assess ulceration(s) every visit Provide education on ulcer and skin care Notes: Electronic Signature(s) Signed: 03/10/2021 4:56:05 PM By: Zandra Abts RN, BSN Entered By: Zandra Abts on 03/10/2021 09:05:23 -------------------------------------------------------------------------------- Pain Assessment Details Patient Name: Date of Service: [name], [name] LV IN 03/10/2021 7:30 [name] M Medical Record Number: [id number] Patient Account Number: [id number] Date of Birth/Sex: Treating RN: 1961-06-30 (60 y.o. Male) Fonnie Mu Primary Care Quiara Killian: PCP, NO Other Clinician: Referring Manuel Dall: Treating Nandana Krolikowski/Extender: Valentino Saxon in Treatment: 0 Active Problems Location of Pain Severity and Description of Pain Patient Has Paino Yes Site Locations Pain  Location: Generalized Pain, Pain in Ulcers With Dressing Change: Yes Duration of the Pain. Constant / Intermittento Constant Rate the pain. Current Pain Level: 7 Worst Pain Level: 10 Least Pain Level: 0 Tolerable Pain Level: 7 Character of Pain Describe the Pain: Aching Pain Management and Medication Current Pain Management: Medication: No Cold Application: No Rest: No Massage: No Activity: No T.E.N.S.: No Heat Application: No Leg drop or elevation: No Is the Current Pain Management Adequate: Adequate How does your wound impact your activities of daily livingo Sleep: No Bathing: No Appetite: No Relationship With Others: No Bladder Continence: No Emotions: No Bowel Continence: No Work: No Toileting: No Drive: No Dressing: No Hobbies: No Electronic Signature(s) Signed: 03/10/2021 6:02:58 PM By: Fonnie Mu RN Entered By: Fonnie Mu on 03/10/2021 08:14:39 -------------------------------------------------------------------------------- Patient/Caregiver Education Details Patient Name: Date of Service: Stephen Donath LV IN 6/29/2022andnbsp7:30 [name] M Medical Record Number: [id number] Patient Account Number: [id number] Date of Birth/Gender: Treating RN: July 05, 1961 (60 y.o. Male) Zandra Abts Primary Care Physician: PCP, NO Other Clinician: Referring Physician: Treating Physician/Extender: Valentino Saxon in Treatment: 0 Education Assessment Education Provided To: Patient Education Topics Provided Elevated Blood Sugar/ Impact on Healing: Methods: Explain/Verbal Responses: State content correctly Nutrition: Methods: Explain/Verbal Responses: State content correctly Pressure: Handouts: Pressure Ulcers: Care and Offloading Methods: Explain/Verbal, Printed Responses: State content correctly Wound/Skin Impairment: Methods: Explain/Verbal  Responses: State content correctly Electronic Signature(s) Signed: 03/10/2021 4:56:05 PM By: Zandra AbtsLynch, Shatara RN,  BSN Entered By: Zandra AbtsLynch, Shatara on 03/10/2021 09:06:19 -------------------------------------------------------------------------------- Wound Assessment Details Patient Name: Date of Service: [name], [name] LV IN 03/10/2021 7:30 [name] M Medical Record Number: [id number] Patient Account Number: [id number] Date of Birth/Sex: Treating RN: [birth year](59 y.o. Male) Fonnie MuBreedlove, Lauren Primary Care Paizleigh Wilds: PCP, NO Other Clinician: Referring Oriyah Lamphear: Treating Tsugio Elison/Extender: Valentino Saxonobson, Michael Weeks in Treatment: 0 Wound Status Wound Number: 1 Primary Pressure Ulcer Etiology: Wound Location: Right Gluteus Wound Status: Open Wounding Event: Pressure Injury Comorbid Coronary Artery Disease, Hypertension, Type II Diabetes, Date Acquired: 12/11/2020 History: Osteoarthritis Weeks Of Treatment: 0 Clustered Wound: No Photos Wound Measurements Length: (cm) 3.4 Width: (cm) 1.5 Depth: (cm) 5.5 Area: (cm) 4.006 Volume: (cm) 22.03 % Reduction in Area: 0% % Reduction in Volume: 0% Epithelialization: Medium (34-66%) Tunneling: No Undermining: Yes Starting Position (o'clock): 10 Ending Position (o'clock): 12 Maximum Distance: (cm) 7.5 Wound Description Classification: Category/Stage III Wound Margin: Distinct, outline attached Exudate Amount: Large Exudate Type: Serosanguineous Exudate Color: red, brown Wound Bed Granulation Amount: Large (67-100%) Granulation Quality: Red, Pink Necrotic Amount: None Present (0%) Foul Odor After Cleansing: No Slough/Fibrino No Exposed Structure Fascia Exposed: No Fat Layer (Subcutaneous Tissue) Exposed: Yes Tendon Exposed: No Muscle Exposed: No Joint Exposed: No Bone Exposed: No Electronic Signature(s) Signed: 03/10/2021 5:02:46 PM By: Karl Itoawkins, Destiny Signed: 03/10/2021 6:02:58 PM By: Fonnie MuBreedlove, Lauren RN Entered By: Karl Itoawkins, Destiny on 03/10/2021 15:47:21 -------------------------------------------------------------------------------- Vitals  Details Patient Name: Date of Service: [name], [name] LV IN 03/10/2021 7:30 [name] M Medical Record Number: [id number] Patient Account Number: [id number] Date of Birth/Sex: Treating RN: [birth year] (60 y.o. Male) Fonnie MuBreedlove, Lauren Primary Care Rogen Porte: PCP, NO Other Clinician: Referring Habeeb Puertas: Treating Charod Slawinski/Extender: Valentino Saxonobson, Michael Weeks in Treatment: 0 Vital Signs Time Taken: 08:04 Temperature (F): 98.7 Height (in): 73 Pulse (bpm): 103 Source: Stated Respiratory Rate (breaths/min): 17 Weight (lbs): 248 Blood Pressure (mmHg): 125/88 Source: Stated Capillary Blood Glucose (mg/dl): 696174 Body Mass Index (BMI): 32.7 Reference Range: 80 - 120 mg / dl Electronic Signature(s) Signed: 03/10/2021 6:02:58 PM By: Fonnie MuBreedlove, Lauren RN Entered By: Fonnie MuBreedlove, Lauren on 03/10/2021 08:17:47

## 2021-03-10 NOTE — Progress Notes (Signed)
[name], [name] ([id number]) Visit Report for 03/10/2021 Abuse/Suicide Risk Screen Details Patient Name: Date of Service: [name], [name] LV [place] 03/10/2021 7:30 A M Medical Record Number: [id number] Patient Account Number: [id number] Date of Birth/Sex: Treating RN: [birth year] (60 y.o. Male) Fonnie Mu Primary Care Ellamay Fors: PCP, NO Other Clinician: Referring Carlynn Leduc: Treating Railyn House/Extender: Valentino Saxon in Treatment: 0 Abuse/Suicide Risk Screen Items Answer ABUSE RISK SCREEN: Has anyone close to you tried to hurt or harm you recentlyo No Do you feel uncomfortable with anyone in your familyo No Has anyone forced you do things that you didnt want to doo No Electronic Signature(s) Signed: 03/10/2021 6:02:58 PM By: Fonnie Mu RN Entered By: Fonnie Mu on 03/10/2021 08:10:18 -------------------------------------------------------------------------------- Activities of Daily Living Details Patient Name: Date of Service: [name], [name] LV [place] 03/10/2021 7:30 A M Medical Record Number: [id number] Patient Account Number: [id number] Date of Birth/Sex: Treating RN: April 11, 1961 (60 y.o. Male) Fonnie Mu Primary Care Lamiyah Schlotter: PCP, NO Other Clinician: Referring Sansa Alkema: Treating Carsin Randazzo/Extender: Valentino Saxon in Treatment: 0 Activities of Daily Living Items Answer Activities of Daily Living (Please select one for each item) Drive Automobile Not Able T Medications ake Completely Able Use T elephone Completely Able Care for Appearance Need Assistance Use T oilet Need Assistance Bath / Shower Need Assistance Dress Self Need Assistance Feed Self Completely Able Walk Need Assistance Get In / Out Bed Need Assistance Housework Need Assistance Prepare Meals Need Assistance Handle Money Need Assistance Shop for Self Need Assistance Electronic Signature(s) Signed: 03/10/2021 6:02:58 PM By: Fonnie Mu RN Entered By: Fonnie Mu on 03/10/2021  08:11:12 -------------------------------------------------------------------------------- Education Screening Details Patient Name: Date of Service: [name], [name] LV [place] 03/10/2021 7:30 A M Medical Record Number: [id number] Patient Account Number: [id number] Date of Birth/Sex: Treating RN: 28-May-1961 (60 y.o. Male) Fonnie Mu Primary Care Andrews Tener: PCP, NO Other Clinician: Referring Ezrie Bunyan: Treating Kebin Maye/Extender: Valentino Saxon in Treatment: 0 Primary Learner Assessed: Patient Learning Preferences/Education Level/Primary Language Learning Preference: Explanation, Demonstration, Communication Board, Printed Material Highest Education Level: High School Preferred Language: English Cognitive Barrier Language Barrier: No Translator Needed: No Memory Deficit: No Emotional Barrier: No Cultural/Religious Beliefs Affecting Medical Care: No Physical Barrier Impaired Vision: No Impaired Hearing: No Decreased Hand dexterity: No Knowledge/Comprehension Knowledge Level: High Comprehension Level: High Ability to understand written instructions: High Ability to understand verbal instructions: High Motivation Anxiety Level: Calm Cooperation: Cooperative Education Importance: Denies Need Interest in Health Problems: Asks Questions Perception: Coherent Willingness to Engage in Self-Management High Activities: Readiness to Engage in Self-Management High Activities: Electronic Signature(s) Signed: 03/10/2021 6:02:58 PM By: Fonnie Mu RN Entered By: Fonnie Mu on 03/10/2021 08:13:03 -------------------------------------------------------------------------------- Fall Risk Assessment Details Patient Name: Date of Service: [name] LV IN 03/10/2021 7:30 A M Medical Record Number: [id number] Patient Account Number: [id number] Date of Birth/Sex: Treating RN: 05-25-1961 (60 y.o. Male) Fonnie Mu Primary Care Elizabeht Suto: PCP, NO Other  Clinician: Referring Dorion Petillo: Treating Byron Peacock/Extender: Valentino Saxon in Treatment: 0 Fall Risk Assessment Items Have you had 2 or more falls in the last 12 monthso 0 No Have you had any fall that resulted in injury in the last 12 monthso 0 No FALLS RISK SCREEN History of falling - immediate or within 3 months 0 No Secondary diagnosis (Do you have 2 or more medical diagnoseso) 0 No Ambulatory aid None/bed rest/wheelchair/nurse 0 No Crutches/cane/walker 0 No Furniture 0 No Intravenous therapy Access/Saline/Heparin Lock 0 No Gait/Transferring Normal/ bed rest/ wheelchair 0 No Weak (short steps with or without shuffle, stooped but able  to lift head while walking, may seek 0 No support from furniture) Impaired (short steps with shuffle, may have difficulty arising from chair, head down, impaired 0 No balance) Mental Status Oriented to own ability 0 No Electronic Signature(s) Signed: 03/10/2021 6:02:58 PM By: Fonnie Mu RN Entered By: Fonnie Mu on 03/10/2021 08:13:10 -------------------------------------------------------------------------------- Foot Assessment Details Patient Name: Date of Service: [name], [name] LV IN 03/10/2021 7:30 A M Medical Record Number: [id number] Patient Account Number: [id number] Date of Birth/Sex: Treating RN: 1961/04/03 (60 y.o. Male) Fonnie Mu Primary Care Meleena Munroe: PCP, NO Other Clinician: Referring Kinney Sackmann: Treating Oluwatomisin Hustead/Extender: Valentino Saxon in Treatment: 0 Foot Assessment Items Site Locations + = Sensation present, - = Sensation absent, C = Callus, U = Ulcer R = Redness, W = Warmth, M = Maceration, PU = Pre-ulcerative lesion F = Fissure, S = Swelling, D = Dryness Assessment Right: Left: Other Deformity: No No Prior Foot Ulcer: No No Prior Amputation: No No Charcot Joint: No No Ambulatory Status: Gait: Notes no LE wounds Electronic Signature(s) Signed: 03/10/2021 6:02:58 PM By: Fonnie Mu RN Entered By: Fonnie Mu on 03/10/2021 08:45:06 -------------------------------------------------------------------------------- Nutrition Risk Screening Details Patient Name: Date of Service: [name], [name] LV IN 03/10/2021 7:30 A M Medical Record Number: [id number] Patient Account Number: [id number] Date of Birth/Sex: Treating RN: 1961/01/03 (59 y.o. Male) Fonnie Mu Primary Care Jarah Pember: PCP, NO Other Clinician: Referring Tonjua Rossetti: Treating Zamyia Gowell/Extender: Valentino Saxon in Treatment: 0 Height (in): Weight (lbs): Body Mass Index (BMI): Nutrition Risk Screening Items Score Screening NUTRITION RISK SCREEN: I have an illness or condition that made me change the kind and/or amount of food I eat 0 No I eat fewer than two meals per day 0 No I eat few fruits and vegetables, or milk products 0 No I have three or more drinks of beer, liquor or wine almost every day 0 No I have tooth or mouth problems that make it hard for me to eat 0 No I don't always have enough money to buy the food I need 0 No I eat alone most of the time 0 No I take three or more different prescribed or over-the-counter drugs a day 0 No Without wanting to, I have lost or gained 10 pounds in the last six months 0 No I am not always physically able to shop, cook and/or feed myself 0 No Nutrition Protocols Good Risk Protocol 0 No interventions needed Moderate Risk Protocol High Risk Proctocol Risk Level: Good Risk Score: 0 Electronic Signature(s) Signed: 03/10/2021 6:02:58 PM By: Fonnie Mu RN Entered By: Fonnie Mu on 03/10/2021 08:13:26

## 2021-03-17 ENCOUNTER — Other Ambulatory Visit: Payer: Self-pay

## 2021-03-17 ENCOUNTER — Encounter (HOSPITAL_BASED_OUTPATIENT_CLINIC_OR_DEPARTMENT_OTHER): Payer: Medicaid Other | Attending: Internal Medicine | Admitting: Internal Medicine

## 2021-03-17 DIAGNOSIS — E1151 Type 2 diabetes mellitus with diabetic peripheral angiopathy without gangrene: Secondary | ICD-10-CM | POA: Diagnosis not present

## 2021-03-17 DIAGNOSIS — E11622 Type 2 diabetes mellitus with other skin ulcer: Secondary | ICD-10-CM | POA: Insufficient documentation

## 2021-03-17 DIAGNOSIS — L89313 Pressure ulcer of right buttock, stage 3: Secondary | ICD-10-CM | POA: Insufficient documentation

## 2021-03-17 NOTE — Progress Notes (Signed)
[name] ([id number]) Visit Report for 03/17/2021 Arrival Information Details Patient Name: Date of Service: [name] LV IN 03/17/2021 9:15 A M Medical Record Number: [id number] Patient Account Number: [id number] Date of Birth/Sex: Treating RN: 23-Oct-1960 (60 y.o. [name] Primary Care Truong Delcastillo: PCP, NO Other Clinician: Referring Khamryn Calderone: Treating Clovis Mankins/Extender: Valentino Saxon in Treatment: 1 Visit Information History Since Last Visit Added or deleted any medications: No Patient Arrived: Ambulatory Any new allergies or adverse reactions: No Arrival Time: 09:29 Had a fall or experienced change in No Accompanied By: wife activities of daily living that may affect Transfer Assistance: None risk of falls: Patient Identification Verified: Yes Signs or symptoms of abuse/neglect since last visito No Secondary Verification Process Completed: Yes Hospitalized since last visit: No Patient Requires Transmission-Based Precautions: No Implantable device outside of the clinic excluding No Patient Has Alerts: No cellular tissue based products placed in the center since last visit: Has Dressing in Place as Prescribed: Yes Pain Present Now: No Electronic Signature(s) Signed: 03/17/2021 10:37:26 AM By: Karl Ito Entered By: Karl Ito on 03/17/2021 09:29:25 -------------------------------------------------------------------------------- Lower Extremity Assessment Details Patient Name: Date of Service: [name] LV IN 03/17/2021 9:15 A M Medical Record Number: [id number] Patient Account Number: [id number] Date of Birth/Sex: Treating RN: 05-Apr-1961 (60 y.o. Tammy Sours Primary Care Dellia Donnelly: PCP, NO Other Clinician: Referring Ronak Duquette: Treating Donis Kotowski/Extender: Valentino Saxon in Treatment: 1 Electronic Signature(s) Signed: 03/17/2021 6:50:03 PM By: Shawn Stall Entered By: Shawn Stall on 03/17/2021  09:42:17 -------------------------------------------------------------------------------- Multi Wound Chart Details Patient Name: Date of Service: [name] LV IN 03/17/2021 9:15 A M Medical Record Number: [id number] Patient Account Number: [id number] Date of Birth/Sex: Treating RN: 02-Sep-1961 (24 y.o. [name] Primary Care Nima Kemppainen: PCP, NO Other Clinician: Referring Anetta Olvera: Treating Marshae Azam/Extender: Valentino Saxon in Treatment: 1 Vital Signs Height(in): 73 Pulse(bpm): 98 Weight(lbs): 248 Blood Pressure(mmHg): 126/83 Body Mass Index(BMI): 33 Temperature(F): 97.7 Respiratory Rate(breaths/min): 17 Photos: [1:No Photos Right Gluteus] [N/A:N/A N/A] Wound Location: [1:Pressure Injury] [N/A:N/A] Wounding Event: [1:Pressure Ulcer] [N/A:N/A] Primary Etiology: [1:Coronary Artery Disease,] [N/A:N/A] Comorbid History: [1:Hypertension, Type II Diabetes, Osteoarthritis 12/11/2020] [N/A:N/A] Date Acquired: [1:1] [N/A:N/A] Weeks of Treatment: [1:Open] [N/A:N/A] Wound Status: [1:2.7x1x5.5] [N/A:N/A] Measurements L x W x D (cm) [1:2.121] [N/A:N/A] A (cm) : rea [1:11.663] [N/A:N/A] Volume (cm) : [1:47.10%] [N/A:N/A] % Reduction in A [1:rea: 47.10%] [N/A:N/A] % Reduction in Volume: [1:12] Position 1 (o'clock): [1:6.9] Maximum Distance 1 (cm): [1:Yes] [N/A:N/A] Tunneling: [1:Category/Stage III] [N/A:N/A] Classification: [1:Large] [N/A:N/A] Exudate A mount: [1:Serosanguineous] [N/A:N/A] Exudate Type: [1:red, brown] [N/A:N/A] Exudate Color: [1:Distinct, outline attached] [N/A:N/A] Wound Margin: [1:Large (67-100%)] [N/A:N/A] Granulation A mount: [1:Red, Pink] [N/A:N/A] Granulation Quality: [1:None Present (0%)] [N/A:N/A] Necrotic A mount: [1:Fat Layer (Subcutaneous Tissue): Yes N/A] Exposed Structures: [1:Fascia: No Tendon: No Muscle: No Joint: No Bone: No Medium (34-66%)] [N/A:N/A] Epithelialization: [1:Debridement - Excisional] [N/A:N/A] Debridement: Pre-procedure  Verification/Time Out 10:02 [N/A:N/A] Taken: [1:Subcutaneous, Slough] [N/A:N/A] Tissue Debrided: [1:Skin/Subcutaneous Tissue] [N/A:N/A] Level: [1:2.7] [N/A:N/A] Debridement A (sq cm): [1:rea Curette] [N/A:N/A] Instrument: [1:Minimum] [N/A:N/A] Bleeding: [1:Pressure] [N/A:N/A] Hemostasis A chieved: [1:0] [N/A:N/A] Procedural Pain: [1:0] [N/A:N/A] Post Procedural Pain: [1:Procedure was tolerated well] [N/A:N/A] Debridement Treatment Response: [1:2.7x1x5.5] [N/A:N/A] Post Debridement Measurements L x W x D (cm) [1:11.663] [N/A:N/A] Post Debridement Volume: (cm) [1:Category/Stage III] [N/A:N/A] Post Debridement Stage: [1:Debridement] [N/A:N/A] Treatment Notes Electronic Signature(s) Signed: 03/17/2021 5:13:41 PM By: Baltazar Najjar MD Signed: 03/17/2021 7:11:13 PM By: Zandra Abts RN, BSN Entered By: Baltazar Najjar on 03/17/2021 10:11:46 -------------------------------------------------------------------------------- Multi-Disciplinary Care Plan Details Patient Name: Date  of Service: [name] LV IN 03/17/2021 9:15 A M Medical Record Number: [id number] Patient Account Number: [id number] Date of Birth/Sex: Treating RN: [birth year] (70 y.o. [name] Primary Care Jalexa Pifer: PCP, NO Other Clinician: Referring Zain Lankford: Treating Rayanna Matusik/Extender: Valentino Saxon in Treatment: 1 Multidisciplinary Care Plan reviewed with physician Active Inactive Nutrition Nursing Diagnoses: Impaired glucose control: actual or potential Potential for alteratiion in Nutrition/Potential for imbalanced nutrition Goals: Patient/caregiver agrees to and verbalizes understanding of need to use nutritional supplements and/or vitamins as prescribed Date Initiated: 03/10/2021 Target Resolution Date: 04/09/2021 Goal Status: Active Patient/caregiver will maintain therapeutic glucose control Date Initiated: 03/10/2021 Target Resolution Date: 04/09/2021 Goal Status: Active Interventions: Assess HgA1c  results as ordered upon admission and as needed Assess patient nutrition upon admission and as needed per policy Provide education on elevated blood sugars and impact on wound healing Provide education on nutrition Treatment Activities: Education provided on Nutrition : 03/10/2021 Notes: Pressure Nursing Diagnoses: Knowledge deficit related to causes and risk factors for pressure ulcer development Knowledge deficit related to management of pressures ulcers Potential for impaired tissue integrity related to pressure, friction, moisture, and shear Goals: Patient will remain free from development of additional pressure ulcers Date Initiated: 03/10/2021 Target Resolution Date: 04/09/2021 Goal Status: Active Patient/caregiver will verbalize risk factors for pressure ulcer development Date Initiated: 03/10/2021 Target Resolution Date: 04/09/2021 Goal Status: Active Patient/caregiver will verbalize understanding of pressure ulcer management Date Initiated: 03/10/2021 Target Resolution Date: 04/09/2021 Goal Status: Active Interventions: Assess: immobility, friction, shearing, incontinence upon admission and as needed Assess offloading mechanisms upon admission and as needed Assess potential for pressure ulcer upon admission and as needed Provide education on pressure ulcers Notes: Wound/Skin Impairment Nursing Diagnoses: Impaired tissue integrity Knowledge deficit related to ulceration/compromised skin integrity Goals: Patient/caregiver will verbalize understanding of skin care regimen Date Initiated: 03/10/2021 Target Resolution Date: 04/09/2021 Goal Status: Active Ulcer/skin breakdown will have a volume reduction of 30% by week 4 Date Initiated: 03/10/2021 Target Resolution Date: 04/09/2021 Goal Status: Active Interventions: Assess patient/caregiver ability to obtain necessary supplies Assess patient/caregiver ability to perform ulcer/skin care regimen upon admission and as  needed Assess ulceration(s) every visit Provide education on ulcer and skin care Notes: Electronic Signature(s) Signed: 03/17/2021 7:11:13 PM By: Zandra Abts RN, BSN Entered By: Zandra Abts on 03/17/2021 10:07:37 -------------------------------------------------------------------------------- Pain Assessment Details Patient Name: Date of Service: NAYDEN, CZAJKA LV IN 03/17/2021 9:15 A M Medical Record Number: [id number] Patient Account Number: [id number] Date of Birth/Sex: Treating RN: 11-10-1960 (71 y.o. [name] Primary Care Kaitlyn Franko: PCP, NO Other Clinician: Referring Berkeley Veldman: Treating Jamee Keach/Extender: Valentino Saxon in Treatment: 1 Active Problems Location of Pain Severity and Description of Pain Patient Has Paino No Site Locations Pain Management and Medication Current Pain Management: Electronic Signature(s) Signed: 03/17/2021 10:37:26 AM By: Karl Ito Signed: 03/17/2021 7:11:13 PM By: Zandra Abts RN, BSN Entered By: Karl Ito on 03/17/2021 09:29:48 -------------------------------------------------------------------------------- Patient/Caregiver Education Details Patient Name: Date of Service: Lannette Donath LV IN 7/6/2022andnbsp9:15 A M Medical Record Number: [id number] Patient Account Number: [id number] Date of Birth/Gender: Treating RN: 1961/05/21 (72 y.o. [name] Primary Care Physician: PCP, NO Other Clinician: Referring Physician: Treating Physician/Extender: Valentino Saxon in Treatment: 1 Education Assessment Education Provided To: Patient Education Topics Provided Wound/Skin Impairment: Methods: Explain/Verbal Responses: State content correctly Electronic Signature(s) Signed: 03/17/2021 7:11:13 PM By: Zandra Abts RN, BSN Entered By: Zandra Abts on 03/17/2021 10:08:24 -------------------------------------------------------------------------------- Wound Assessment Details Patient Name: Date of  Service: Enberg, A LV IN 03/17/2021 9:15 A  M Medical Record Number: [id number] Patient Account Number: [id number] Date of Birth/Sex: Treating RN: 1960/11/10 (36 y.o. [name] Primary Care Frisco Cordts: PCP, NO Other Clinician: Referring Margaretta Chittum: Treating Javonna Balli/Extender: Valentino Saxon in Treatment: 1 Wound Status Wound Number: 1 Primary Pressure Ulcer Etiology: Wound Location: Right Gluteus Wound Status: Open Wounding Event: Pressure Injury Comorbid Coronary Artery Disease, Hypertension, Type II Diabetes, Date Acquired: 12/11/2020 History: Osteoarthritis Weeks Of Treatment: 1 Clustered Wound: No Photos Wound Measurements Length: (cm) 2.7 Width: (cm) 1 Depth: (cm) 5.5 Area: (cm) 2.121 Volume: (cm) 11.663 % Reduction in Area: 47.1% % Reduction in Volume: 47.1% Epithelialization: Medium (34-66%) Tunneling: Yes Position (o'clock): 12 Maximum Distance: (cm) 6.9 Undermining: No Wound Description Classification: Category/Stage III Wound Margin: Distinct, outline attached Exudate Amount: Large Exudate Type: Serosanguineous Exudate Color: red, brown Foul Odor After Cleansing: No Slough/Fibrino No Wound Bed Granulation Amount: Large (67-100%) Exposed Structure Granulation Quality: Red, Pink Fascia Exposed: No Necrotic Amount: None Present (0%) Fat Layer (Subcutaneous Tissue) Exposed: Yes Tendon Exposed: No Muscle Exposed: No Joint Exposed: No Bone Exposed: No Treatment Notes Wound #1 (Gluteus) Wound Laterality: Right Cleanser Normal Saline Discharge Instruction: Cleanse the wound with Normal Saline prior to applying a clean dressing using gauze sponges, not tissue or cotton balls. Wound Cleanser Discharge Instruction: Cleanse the wound with wound cleanser prior to applying a clean dressing using gauze sponges, not tissue or cotton balls. Peri-Wound Care Topical Primary Dressing KerraCel Ag Gelling Fiber Dressing, 4x5 in (silver alginate) Discharge  Instruction: Apply silver alginate to wound bed, lightly pack undermining Secondary Dressing Woven Gauze Sponge, Non-Sterile 4x4 in Discharge Instruction: Apply over primary dressing as directed. ABD Pad, 5x9 Discharge Instruction: Apply over primary dressing as directed. Secured With 97M Medipore H Soft Cloth Surgical T 4 x 2 (in/yd) ape Discharge Instruction: Secure dressing with tape as directed. Compression Wrap Compression Stockings Add-Ons Electronic Signature(s) Signed: 03/17/2021 5:22:20 PM By: Karl Ito Signed: 03/17/2021 7:11:13 PM By: Zandra Abts RN, BSN Entered By: Karl Ito on 03/17/2021 16:58:03 -------------------------------------------------------------------------------- Vitals Details Patient Name: Date of Service: SHARVIL, HOEY LV IN 03/17/2021 9:15 A M Medical Record Number: [id number] Patient Account Number: [id number] Date of Birth/Sex: Treating RN: 05/27/1961 (31 y.o. [name] Primary Care Fraidy Mccarrick: PCP, NO Other Clinician: Referring Chellsie Gomer: Treating Duan Scharnhorst/Extender: Valentino Saxon in Treatment: 1 Vital Signs Time Taken: 09:29 Temperature (F): 97.7 Height (in): 73 Pulse (bpm): 98 Weight (lbs): 248 Respiratory Rate (breaths/min): 17 Body Mass Index (BMI): 32.7 Blood Pressure (mmHg): 126/83 Reference Range: 80 - 120 mg / dl Electronic Signature(s) Signed: 03/17/2021 10:37:26 AM By: Karl Ito Entered By: Karl Ito on 03/17/2021 09:29:41

## 2021-03-17 NOTE — Progress Notes (Signed)
ABIJAH, ROUSSEL ([id number]) Visit Report for 03/17/2021 Debridement Details Patient Name: Date of Service: ANTRELL, TIPLER LV IN 03/17/2021 9:15 A M Medical Record Number: [id number] Patient Account Number: [id number] Date of Birth/Sex: Treating RN: 06/08/1961 (60 y.o. Elizebeth Koller Primary Care Provider: PCP, NO Other Clinician: Referring Provider: Treating Provider/Extender: Valentino Saxon in Treatment: 1 Debridement Performed for Assessment: Wound #1 Right Gluteus Performed By: Physician Maxwell Caul., MD Debridement Type: Debridement Level of Consciousness (Pre-procedure): Awake and Alert Pre-procedure Verification/Time Out Yes - 10:02 Taken: Start Time: 10:02 T Area Debrided (L x W): otal 2.7 (cm) x 1 (cm) = 2.7 (cm) Tissue and other material debrided: Viable, Non-Viable, Slough, Subcutaneous, Biofilm, Slough Level: Skin/Subcutaneous Tissue Debridement Description: Excisional Instrument: Curette Bleeding: Minimum Hemostasis Achieved: Pressure End Time: 10:04 Procedural Pain: 0 Post Procedural Pain: 0 Response to Treatment: Procedure was tolerated well Level of Consciousness (Post- Awake and Alert procedure): Post Debridement Measurements of Total Wound Length: (cm) 2.7 Stage: Category/Stage III Width: (cm) 1 Depth: (cm) 5.5 Volume: (cm) 11.663 Character of Wound/Ulcer Post Debridement: Improved Post Procedure Diagnosis Same as Pre-procedure Electronic Signature(s) Signed: 03/17/2021 5:13:41 PM By: Baltazar Najjar MD Signed: 03/17/2021 7:11:13 PM By: Zandra Abts RN, BSN Entered By: Baltazar Najjar on 03/17/2021 10:11:58 -------------------------------------------------------------------------------- HPI Details Patient Name: Date of Service: MAEL, DELAP LV IN 03/17/2021 9:15 A M Medical Record Number: [id number] Patient Account Number: [id number] Date of Birth/Sex: Treating RN: 1960-10-25 (60 y.o. Elizebeth Koller Primary Care Provider: PCP, NO Other  Clinician: Referring Provider: Treating Provider/Extender: Valentino Saxon in Treatment: 1 History of Present Illness HPI Description: ADMISSION 03/10/2021 This is a 60 year old man who had became acutely ill largely as a result of urosepsis with delirium and had a prolonged hospitalization from 12/10/2020 through 02/02/2021. In brief he was septic with septic shock with E. coli bacteremia. He required intubation. Developed MSSA pneumonia and later in the course septic thrombophlebitis and coag negative staph sepsis. 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 During the course of his hospitalization he developed a right buttock pressure ulcer which was defined in the discharge problem list. He was subsequently sent to [facility] and was returned home 2 weeks ago. They do not have any insurance [Medicaid pending] in spite of this based on the picture on the patient's cell phone they have done an amazing job with getting this wound to look smaller. There is still a tunneling area. The patient is not incontinent eating and drinking well. His wife changing the dressing 3 times a day. Other past medical history includes type 2 diabetes, hypertension, coronary artery disease, 5 cm abdominal aortic aneurysm and he had a ureteral stent placed on 12/16/2020 in the early part of his hospitalization 7/6; this is a patient with a deep tunneling pressure ulcer in the lower sacrum/coccyx area. We have been using silver alginate. Minimal amount of drainage no evidence of infection. He now has Medicaid according to his wife but unfortunately they do not pay much in terms of wound care supplies and I talked to her about this today Electronic Signature(s) Signed: 03/17/2021 5:13:41 PM By: Baltazar Najjar MD Entered By: Baltazar Najjar on 03/17/2021 10:12:53 -------------------------------------------------------------------------------- Physical Exam Details Patient Name: Date of Service: SHAKA, CARDIN LV IN 03/17/2021 9:15 A M Medical Record Number: [id number] Patient Account Number: [id number] Date of Birth/Sex: Treating RN: Mar 18, 1961 (60 y.o. Elizebeth Koller Primary Care Provider: PCP, NO Other Clinician: Referring Provider: Treating Provider/Extender: Valentino Saxon in Treatment: 1 Constitutional  Sitting or standing Blood Pressure is within target range for patient.. Pulse regular and within target range for patient.Marland Kitchen Respirations regular, non-labored and within target range.. Temperature is normal and within the target range for the patient.Marland Kitchen Appears in no distress. Notes Wound exam; right buttock in close proximity to the sacral area. Deep tunneling area measures 5.5 cm. This is somewhat better than what I measured last week. There is no purulence and really no identifiable drainage. I have not cultured this yet it does not obviously go to bone. No surrounding soft tissue tenderness or infection Electronic Signature(s) Signed: 03/17/2021 5:13:41 PM By: Baltazar Najjar MD Entered By: Baltazar Najjar on 03/17/2021 10:14:07 -------------------------------------------------------------------------------- Physician Orders Details Patient Name: Date of Service: OMARRION, CARMER LV IN 03/17/2021 9:15 A M Medical Record Number: [id number] Patient Account Number: [id number] Date of Birth/Sex: Treating RN: November 07, 1960 (60 y.o. Elizebeth Koller Primary Care Provider: PCP, NO Other Clinician: Referring Provider: Treating Provider/Extender: Valentino Saxon in Treatment: 1 Verbal / Phone Orders: No Diagnosis Coding ICD-10 Coding Code Description L89.313 Pressure ulcer of right buttock, stage 3 Follow-up Appointments ppointment in 1 week. - with Dr. Leanord Hawking Return A Bathing/ Shower/ Hygiene May shower and wash wound with soap and water. - prior to dressing change Off-Loading Gel wheelchair cushion Turn and reposition every 2 hours Wound Treatment Wound #1 - Gluteus Wound  Laterality: Right Cleanser: Normal Saline Discharge Instructions: Cleanse the wound with Normal Saline prior to applying a clean dressing using gauze sponges, not tissue or cotton balls. Cleanser: Wound Cleanser Discharge Instructions: Cleanse the wound with wound cleanser prior to applying a clean dressing using gauze sponges, not tissue or cotton balls. Prim Dressing: KerraCel Ag Gelling Fiber Dressing, 4x5 in (silver alginate) ary Discharge Instructions: Apply silver alginate to wound bed, lightly pack undermining Secondary Dressing: Woven Gauze Sponge, Non-Sterile 4x4 in Discharge Instructions: Apply over primary dressing as directed. Secondary Dressing: ABD Pad, 5x9 Discharge Instructions: Apply over primary dressing as directed. Secured With: 41M Medipore H Soft Cloth Surgical T 4 x 2 (in/yd) ape Discharge Instructions: Secure dressing with tape as directed. Electronic Signature(s) Signed: 03/17/2021 5:13:41 PM By: Baltazar Najjar MD Signed: 03/17/2021 7:11:13 PM By: Zandra Abts RN, BSN Entered By: Zandra Abts on 03/17/2021 10:07:13 -------------------------------------------------------------------------------- Problem List Details Patient Name: Date of Service: SAVIOR, HIMEBAUGH LV IN 03/17/2021 9:15 A M Medical Record Number: [id number] Patient Account Number: [id number] Date of Birth/Sex: Treating RN: 08/12/1961 (60 y.o. Elizebeth Koller Primary Care Provider: PCP, NO Other Clinician: Referring Provider: Treating Provider/Extender: Valentino Saxon in Treatment: 1 Active Problems ICD-10 Encounter Code Description Active Date MDM Diagnosis L89.313 Pressure ulcer of right buttock, stage 3 03/10/2021 No Yes Inactive Problems Resolved Problems Electronic Signature(s) Signed: 03/17/2021 5:13:41 PM By: Baltazar Najjar MD Entered By: Baltazar Najjar on 03/17/2021 10:11:39 -------------------------------------------------------------------------------- Progress Note  Details Patient Name: Date of Service: GRAINGER, MCCARLEY LV IN 03/17/2021 9:15 A M Medical Record Number: [id number] Patient Account Number: [id number] Date of Birth/Sex: Treating RN: 05/21/1961 (60 y.o. Elizebeth Koller Primary Care Provider: PCP, NO Other Clinician: Referring Provider: Treating Provider/Extender: Valentino Saxon in Treatment: 1 Subjective History of Present Illness (HPI) ADMISSION 03/10/2021 This is a 60 year old man who had became acutely ill largely as a result of urosepsis with delirium and had a prolonged hospitalization from 12/10/2020 through 02/02/2021. In brief he was septic with septic shock with E. coli bacteremia. He required intubation. Developed MSSA pneumonia and later in the course septic thrombophlebitis and coag negative staph sepsis.  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 During the course of his hospitalization he developed a right buttock pressure ulcer which was defined in the discharge problem list. He was subsequently sent to [facility] and was returned home 2 weeks ago. They do not have any insurance [Medicaid pending] in spite of this based on the picture on the patient's cell phone they have done an amazing job with getting this wound to look smaller. There is still a tunneling area. The patient is not incontinent eating and drinking well. His wife changing the dressing 3 times a day. Other past medical history includes type 2 diabetes, hypertension, coronary artery disease, 5 cm abdominal aortic aneurysm and he had a ureteral stent placed on 12/16/2020 in the early part of his hospitalization 7/6; this is a patient with a deep tunneling pressure ulcer in the lower sacrum/coccyx area. We have been using silver alginate. Minimal amount of drainage no evidence of infection. He now has Medicaid according to his wife but unfortunately they do not pay much in terms of wound care supplies and I talked to her about this  today Objective Constitutional Sitting or standing Blood Pressure is within target range for patient.. Pulse regular and within target range for patient.Marland Kitchen Respirations regular, non-labored and within target range.. Temperature is normal and within the target range for the patient.Marland Kitchen Appears in no distress. Vitals Time Taken: 9:29 AM, Height: 73 in, Weight: 248 lbs, BMI: 32.7, Temperature: 97.7 F, Pulse: 98 bpm, Respiratory Rate: 17 breaths/min, Blood Pressure: 126/83 mmHg. General Notes: Wound exam; right buttock in close proximity to the sacral area. Deep tunneling area measures 5.5 cm. This is somewhat better than what I measured last week. There is no purulence and really no identifiable drainage. I have not cultured this yet it does not obviously go to bone. No surrounding soft tissue tenderness or infection Integumentary (Hair, Skin) Wound #1 status is Open. Original cause of wound was Pressure Injury. The date acquired was: 12/11/2020. The wound has been in treatment 1 weeks. The wound is located on the Right Gluteus. The wound measures 2.7cm length x 1cm width x 5.5cm depth; 2.121cm^2 area and 11.663cm^3 volume. There is Fat Layer (Subcutaneous Tissue) exposed. There is no undermining noted, however, there is tunneling at 12:00 with a maximum distance of 6.9cm. There is a large amount of serosanguineous drainage noted. The wound margin is distinct with the outline attached to the wound base. There is large (67-100%) red, pink granulation within the wound bed. There is no necrotic tissue within the wound bed. Assessment Active Problems ICD-10 Pressure ulcer of right buttock, stage 3 Procedures Wound #1 Pre-procedure diagnosis of Wound #1 is a Pressure Ulcer located on the Right Gluteus . There was a Excisional Skin/Subcutaneous Tissue Debridement with a total area of 2.7 sq cm performed by Maxwell Caul., MD. With the following instrument(s): Curette to remove Viable and Non-Viable  tissue/material. Material removed includes Subcutaneous Tissue, Slough, and Biofilm. No specimens were taken. A time out was conducted at 10:02, prior to the start of the procedure. A Minimum amount of bleeding was controlled with Pressure. The procedure was tolerated well with a pain level of 0 throughout and a pain level of 0 following the procedure. Post Debridement Measurements: 2.7cm length x 1cm width x 5.5cm depth; 11.663cm^3 volume. Post debridement Stage noted as Category/Stage III. Character of Wound/Ulcer Post Debridement is improved. Post procedure Diagnosis Wound #1: Same as Pre-Procedure Plan Follow-up Appointments: Return Appointment in  1 week. - with Dr. Leanord Hawkingobson Bathing/ Shower/ Hygiene: May shower and wash wound with soap and water. - prior to dressing change Off-Loading: Gel wheelchair cushion Turn and reposition every 2 hours WOUND #1: - Gluteus Wound Laterality: Right Cleanser: Normal Saline Discharge Instructions: Cleanse the wound with Normal Saline prior to applying a clean dressing using gauze sponges, not tissue or cotton balls. Cleanser: Wound Cleanser Discharge Instructions: Cleanse the wound with wound cleanser prior to applying a clean dressing using gauze sponges, not tissue or cotton balls. Prim Dressing: KerraCel Ag Gelling Fiber Dressing, 4x5 in (silver alginate) ary Discharge Instructions: Apply silver alginate to wound bed, lightly pack undermining Secondary Dressing: Woven Gauze Sponge, Non-Sterile 4x4 in Discharge Instructions: Apply over primary dressing as directed. Secondary Dressing: ABD Pad, 5x9 Discharge Instructions: Apply over primary dressing as directed. Secured With: 35M Medipore H Soft Cloth Surgical T 4 x 2 (in/yd) ape Discharge Instructions: Secure dressing with tape as directed. 1. The wound measures with slightly less depth and the tunneling area at 12:00 than last time. We will see how this progresses from week to week 2. We are still  using silver alginate they have ordered some off [employer]. Truthfully our dressing choices here are limited. They were using a wet-to-dry dressing before they came in here. 3. Hydrofera Blue rope would be a good option but this is likely to be cost prohibitive and not covered by Medicaid 4. I have seen little evidence of infection therefore I have not felt pressed to culture this for x-ray. Electronic Signature(s) Signed: 03/17/2021 5:13:41 PM By: Baltazar Najjarobson, Selso Mannor MD Entered By: Baltazar Najjarobson, Syriana Croslin on 03/17/2021 10:15:49 -------------------------------------------------------------------------------- SuperBill Details Patient Name: Date of Service: Lannette DonathFINLEY, A LV IN 03/17/2021 Medical Record Number: [id number] Patient Account Number: [id number] Date of Birth/Sex: Treating RN: [birth year] (60 y.o. Elizebeth KollerM) Lynch, Shatara Primary Care Provider: PCP, NO Other Clinician: Referring Provider: Treating Provider/Extender: Valentino Saxonobson, Bryana Froemming Weeks in Treatment: 1 Diagnosis Coding ICD-10 Codes Code Description L89.313 Pressure ulcer of right buttock, stage 3 Facility Procedures Physician Procedures : CPT4 Code Description Modifier [id number] - WC PHYS SUBQ TISS 20 SQ CM ICD-10 Diagnosis Description L89.313 Pressure ulcer of right buttock, stage 3 Quantity: 1 Electronic Signature(s) Signed: 03/17/2021 5:13:41 PM By: Baltazar Najjarobson, Miaa Latterell MD Entered By: Baltazar Najjarobson, Seif Teichert on 03/17/2021 10:16:05

## 2021-03-24 ENCOUNTER — Encounter (HOSPITAL_BASED_OUTPATIENT_CLINIC_OR_DEPARTMENT_OTHER): Payer: Self-pay

## 2021-03-24 ENCOUNTER — Encounter (HOSPITAL_BASED_OUTPATIENT_CLINIC_OR_DEPARTMENT_OTHER): Payer: Medicaid Other | Admitting: Internal Medicine

## 2021-03-24 ENCOUNTER — Other Ambulatory Visit: Payer: Self-pay

## 2021-04-07 ENCOUNTER — Other Ambulatory Visit: Payer: Self-pay

## 2021-04-07 ENCOUNTER — Encounter (HOSPITAL_BASED_OUTPATIENT_CLINIC_OR_DEPARTMENT_OTHER): Payer: Medicaid Other | Admitting: Internal Medicine

## 2021-04-07 ENCOUNTER — Other Ambulatory Visit (HOSPITAL_COMMUNITY)
Admission: RE | Admit: 2021-04-07 | Discharge: 2021-04-07 | Disposition: A | Discharge status: Home / Self Care | Payer: Medicaid Other | Attending: Internal Medicine | Admitting: Internal Medicine

## 2021-04-07 DIAGNOSIS — L89313 Pressure ulcer of right buttock, stage 3: Secondary | ICD-10-CM | POA: Insufficient documentation

## 2021-04-07 DIAGNOSIS — E11622 Type 2 diabetes mellitus with other skin ulcer: Secondary | ICD-10-CM | POA: Diagnosis not present

## 2021-04-07 NOTE — Progress Notes (Signed)
[name], [name] ([id number]) Visit Report for 04/07/2021 Arrival Information Details Patient Name: Date of Service: [name], [name] [place] 04/07/2021 10:15 A M Medical Record Number: [id number] Patient Account Number: [id number] Date of Birth/Sex: Treating RN: 1961/01/12 (60 y.o. Marcheta Grammes Primary Care Beckham Capistran: PCP, NO Other Clinician: Referring Tayler Heiden: Treating Airik Goodlin/Extender: Cheree Ditto in Treatment: 4 Visit Information History Since Last Visit Added or deleted any medications: No Patient Arrived: Wheel Chair Any new allergies or adverse reactions: No Arrival Time: 10:44 Had a fall or experienced change in No Accompanied By: wife activities of daily living that may affect Transfer Assistance: None risk of falls: Patient Identification Verified: Yes Signs or symptoms of abuse/neglect since last visito No Secondary Verification Process Completed: Yes Hospitalized since last visit: No Patient Requires Transmission-Based Precautions: No Implantable device outside of the clinic excluding No Patient Has Alerts: No cellular tissue based products placed in the center since last visit: Has Dressing in Place as Prescribed: Yes Pain Present Now: No Electronic Signature(s) Signed: 04/07/2021 5:58:16 PM By: Lorrin Jackson Entered By: Lorrin Jackson on 04/07/2021 10:44:57 -------------------------------------------------------------------------------- Clinic Level of Care Assessment Details Patient Name: Date of Service: RAHIEM, SCHELLINGER Fletcher [place] 04/07/2021 10:15 A M Medical Record Number: [id number] Patient Account Number: [id number] Date of Birth/Sex: Treating RN: 01-01-1961 (60 y.o. Marcheta Grammes Primary Care Tonda Wiederhold: PCP, NO Other Clinician: Referring Demico Ploch: Treating Andreyah Natividad/Extender: Cheree Ditto in Treatment: 4 Clinic Level of Care Assessment Items TOOL 4 Quantity Score X- 1 0 Use when only an EandM is performed on FOLLOW-UP visit ASSESSMENTS - Nursing  Assessment / Reassessment X- 1 10 Reassessment of Co-morbidities (includes updates in patient status) X- 1 5 Reassessment of Adherence to Treatment Plan ASSESSMENTS - Wound and Skin A ssessment / Reassessment X - Simple Wound Assessment / Reassessment - one wound 1 5 '[]'  - 0 Complex Wound Assessment / Reassessment - multiple wounds '[]'  - 0 Dermatologic / Skin Assessment (not related to wound area) ASSESSMENTS - Focused Assessment '[]'  - 0 Circumferential Edema Measurements - multi extremities '[]'  - 0 Nutritional Assessment / Counseling / Intervention '[]'  - 0 Lower Extremity Assessment (monofilament, tuning fork, pulses) '[]'  - 0 Peripheral Arterial Disease Assessment (using hand held doppler) ASSESSMENTS - Ostomy and/or Continence Assessment and Care '[]'  - 0 Incontinence Assessment and Management '[]'  - 0 Ostomy Care Assessment and Management (repouching, etc.) PROCESS - Coordination of Care '[]'  - 0 Simple Patient / Family Education for ongoing care X- 1 20 Complex (extensive) Patient / Family Education for ongoing care '[]'  - 0 Staff obtains Programmer, systems, Records, T Results / Process Orders est '[]'  - 0 Staff telephones HHA, Nursing Homes / Clarify orders / etc '[]'  - 0 Routine Transfer to another Facility (non-emergent condition) '[]'  - 0 Routine Hospital Admission (non-emergent condition) '[]'  - 0 New Admissions / Biomedical engineer / Ordering NPWT Apligraf, etc. , '[]'  - 0 Emergency Hospital Admission (emergent condition) '[]'  - 0 Simple Discharge Coordination '[]'  - 0 Complex (extensive) Discharge Coordination PROCESS - Special Needs '[]'  - 0 Pediatric / Minor Patient Management '[]'  - 0 Isolation Patient Management '[]'  - 0 Hearing / Language / Visual special needs '[]'  - 0 Assessment of Community assistance (transportation, D/C planning, etc.) '[]'  - 0 Additional assistance / Altered mentation '[]'  - 0 Support Surface(s) Assessment (bed, cushion, seat, etc.) INTERVENTIONS - Wound  Cleansing / Measurement X - Simple Wound Cleansing - one wound 1 5 '[]'  - 0 Complex Wound Cleansing - multiple wounds X- 1 5 Wound Imaging (  photographs - any number of wounds) '[]'  - 0 Wound Tracing (instead of photographs) X- 1 5 Simple Wound Measurement - one wound '[]'  - 0 Complex Wound Measurement - multiple wounds INTERVENTIONS - Wound Dressings '[]'  - 0 Small Wound Dressing one or multiple wounds X- 1 15 Medium Wound Dressing one or multiple wounds '[]'  - 0 Large Wound Dressing one or multiple wounds '[]'  - 0 Application of Medications - topical '[]'  - 0 Application of Medications - injection INTERVENTIONS - Miscellaneous '[]'  - 0 External ear exam X- 1 5 Specimen Collection (cultures, biopsies, blood, body fluids, etc.) '[]'  - 0 Specimen(s) / Culture(s) sent or taken to Lab for analysis '[]'  - 0 Patient Transfer (multiple staff / Civil Service fast streamer / Similar devices) '[]'  - 0 Simple Staple / Suture removal (25 or less) '[]'  - 0 Complex Staple / Suture removal (26 or more) '[]'  - 0 Hypo / Hyperglycemic Management (close monitor of Blood Glucose) '[]'  - 0 Ankle / Brachial Index (ABI) - do not check if billed separately X- 1 5 Vital Signs Has the patient been seen at the hospital within the last three years: Yes Total Score: 80 Level Of Care: New/Established - Level 3 Electronic Signature(s) Signed: 04/07/2021 5:58:16 PM By: Lorrin Jackson Entered By: Lorrin Jackson on 04/07/2021 11:05:14 -------------------------------------------------------------------------------- Encounter Discharge Information Details Patient Name: Date of Service: [name], [name] [place] 04/07/2021 10:15 A M Medical Record Number: [id number] Patient Account Number: [id number] Date of Birth/Sex: Treating RN: 1961/07/16 (60 y.o. Marcheta Grammes Primary Care Ronnetta Currington: PCP, NO Other Clinician: Referring Tanyla Stege: Treating Teona Vargus/Extender: Cheree Ditto in Treatment: 4 Encounter Discharge Information Items Discharge  Condition: Stable Ambulatory Status: Wheelchair Discharge Destination: Home Transportation: Private Auto Accompanied By: wife Schedule Follow-up Appointment: Yes Clinical Summary of Care: Provided on 04/07/2021 Form Type Recipient Paper Patient Patient Electronic Signature(s) Signed: 04/07/2021 5:58:16 PM By: Lorrin Jackson Entered By: Lorrin Jackson on 04/07/2021 11:13:38 -------------------------------------------------------------------------------- Lower Extremity Assessment Details Patient Name: Date of Service: [name], [name] [place] 04/07/2021 10:15 A M Medical Record Number: [id number] Patient Account Number: [id number] Date of Birth/Sex: Treating RN: 1961/06/14 (60 y.o. Marcheta Grammes Primary Care Autumn Gunn: PCP, NO Other Clinician: Referring Chriselda Leppert: Treating Juno Bozard/Extender: Cheree Ditto in Treatment: 4 Electronic Signature(s) Signed: 04/07/2021 5:58:16 PM By: Lorrin Jackson Entered By: Lorrin Jackson on 04/07/2021 10:45:59 -------------------------------------------------------------------------------- Multi Wound Chart Details Patient Name: Date of Service: [name], [name] [place] 04/07/2021 10:15 A M Medical Record Number: [id number] Patient Account Number: [id number] Date of Birth/Sex: Treating RN: [birth year] (60 y.o. Janyth Contes Primary Care Dhara Schepp: PCP, NO Other Clinician: Referring Gianelle Mccaul: Treating Prestina Raigoza/Extender: Cheree Ditto in Treatment: 4 Vital Signs Height(in): 73 Pulse(bpm): 90 Weight(lbs): 248 Blood Pressure(mmHg): 121/82 Body Mass Index(BMI): 33 Temperature(F): 97.9 Respiratory Rate(breaths/min): 18 Photos: [N/A:N/A] Right Gluteus N/A N/A Wound Location: Pressure Injury N/A N/A Wounding Event: Pressure Ulcer N/A N/A Primary Etiology: Coronary Artery Disease, N/A N/A Comorbid History: Hypertension, Type II Diabetes, Osteoarthritis 12/11/2020 N/A N/A Date Acquired: 4 N/A N/A Weeks of Treatment: Open N/A N/A Wound  Status: 2.2x0.6x5 N/A N/A Measurements L x W x D (cm) 1.037 N/A N/A A (cm) : rea 5.184 N/A N/A Volume (cm) : 74.10% N/A N/A % Reduction in A rea: 76.50% N/A N/A % Reduction in Volume: Category/Stage III N/A N/A Classification: Large N/A N/A Exudate A mount: Purulent N/A N/A Exudate Type: yellow, brown, green N/A N/A Exudate Color: Distinct, outline attached N/A N/A Wound Margin: Large (67-100%) N/A N/A Granulation A mount: Red, Pink N/A  N/A Granulation Quality: None Present (0%) N/A N/A Necrotic A mount: Fat Layer (Subcutaneous Tissue): Yes N/A N/A Exposed Structures: Fascia: No Tendon: No Muscle: No Joint: No Bone: No Medium (34-66%) N/A N/A Epithelialization: Treatment Notes Electronic Signature(s) Signed: 04/07/2021 4:50:28 PM By: Linton Ham MD Signed: 04/07/2021 5:49:27 PM By: Levan Hurst RN, BSN Entered By: Linton Ham on 04/07/2021 11:09:51 -------------------------------------------------------------------------------- [place] Details Patient Name: Date of Service: [name], [name] [place] 04/07/2021 10:15 A M Medical Record Number: [id number] Patient Account Number: [id number] Date of Birth/Sex: Treating RN: 27-Mar-1961 (60 y.o. Marcheta Grammes Primary Care Gavriel Holzhauer: PCP, NO Other Clinician: Referring Nabria Nevin: Treating Takao Lizer/Extender: Cheree Ditto in Treatment: 4 Multidisciplinary Care Plan reviewed with physician Active Inactive Nutrition Nursing Diagnoses: Impaired glucose control: actual or potential Potential for alteratiion in Nutrition/Potential for imbalanced nutrition Goals: Patient/caregiver agrees to and verbalizes understanding of need to use nutritional supplements and/or vitamins as prescribed Date Initiated: 03/10/2021 Date Inactivated: 04/07/2021 Target Resolution Date: 04/09/2021 Goal Status: Met Patient/caregiver will maintain therapeutic glucose control Date Initiated: 03/10/2021 Target  Resolution Date: 05/05/2021 Goal Status: Active Interventions: Assess HgA1c results as ordered upon admission and as needed Assess patient nutrition upon admission and as needed per policy Provide education on elevated blood sugars and impact on wound healing Provide education on nutrition Treatment Activities: Education provided on Nutrition : 03/10/2021 Notes: 04/07/21: Glucose control ongoing Pressure Nursing Diagnoses: Knowledge deficit related to causes and risk factors for pressure ulcer development Knowledge deficit related to management of pressures ulcers Potential for impaired tissue integrity related to pressure, friction, moisture, and shear Goals: Patient will remain free from development of additional pressure ulcers Date Initiated: 03/10/2021 Target Resolution Date: 05/05/2021 Goal Status: Active Patient/caregiver will verbalize risk factors for pressure ulcer development Date Initiated: 03/10/2021 Date Inactivated: 04/07/2021 Target Resolution Date: 04/09/2021 Goal Status: Met Patient/caregiver will verbalize understanding of pressure ulcer management Date Initiated: 03/10/2021 Date Inactivated: 04/07/2021 Target Resolution Date: 04/09/2021 Goal Status: Met Interventions: Assess: immobility, friction, shearing, incontinence upon admission and as needed Assess offloading mechanisms upon admission and as needed Assess potential for pressure ulcer upon admission and as needed Provide education on pressure ulcers Notes: Wound/Skin Impairment Nursing Diagnoses: Impaired tissue integrity Knowledge deficit related to ulceration/compromised skin integrity Goals: Patient/caregiver will verbalize understanding of skin care regimen Date Initiated: 03/10/2021 Date Inactivated: 04/07/2021 Target Resolution Date: 04/09/2021 Goal Status: Met Ulcer/skin breakdown will have a volume reduction of 30% by week 4 Date Initiated: 03/10/2021 Date Inactivated: 04/07/2021 Target Resolution  Date: 04/09/2021 Goal Status: Met Ulcer/skin breakdown will have a volume reduction of 50% by week 8 Date Initiated: 04/07/2021 Target Resolution Date: 05/05/2021 Goal Status: Active Interventions: Assess patient/caregiver ability to obtain necessary supplies Assess patient/caregiver ability to perform ulcer/skin care regimen upon admission and as needed Assess ulceration(s) every visit Provide education on ulcer and skin care Notes: Electronic Signature(s) Signed: 04/07/2021 5:58:16 PM By: Lorrin Jackson Entered By: Lorrin Jackson on 04/07/2021 10:56:21 -------------------------------------------------------------------------------- Pain Assessment Details Patient Name: Date of Service: [name], [name] [place] 04/07/2021 10:15 A M Medical Record Number: [id number] Patient Account Number: [id number] Date of Birth/Sex: Treating RN: 09/14/1960 (60 y.o. Marcheta Grammes Primary Care Thanos Cousineau: PCP, NO Other Clinician: Referring Dorothee Napierkowski: Treating Earley Grobe/Extender: Cheree Ditto in Treatment: 4 Active Problems Location of Pain Severity and Description of Pain Patient Has Paino No Site Locations Pain Management and Medication Current Pain Management: Electronic Signature(s) Signed: 04/07/2021 5:58:16 PM By: Lorrin Jackson Entered By: Lorrin Jackson on 04/07/2021 10:45:52 -------------------------------------------------------------------------------- Patient/Caregiver Education  Details Patient Name: Date of Service: [name], [name] [place] 7/27/2022andnbsp10:15 [place] Record Number: [id number] Patient Account Number: [id number] Date of Birth/Gender: Treating RN: September 18, 1960 (60 y.o. Marcheta Grammes Primary Care Physician: PCP, NO Other Clinician: Referring Physician: Treating Physician/Extender: Cheree Ditto in Treatment: 4 Education Assessment Education Provided To: Patient Education Topics Provided Elevated Blood Sugar/ Impact on Healing: Methods:  Explain/Verbal Responses: State content correctly Pressure: Methods: Explain/Verbal, Printed Responses: State content correctly Wound/Skin Impairment: Methods: Demonstration, Explain/Verbal, Printed Responses: State content correctly Electronic Signature(s) Signed: 04/07/2021 5:58:16 PM By: Lorrin Jackson Entered By: Lorrin Jackson on 04/07/2021 10:56:44 -------------------------------------------------------------------------------- Wound Assessment Details Patient Name: Date of Service: [name], [name] [place] 04/07/2021 10:15 A M Medical Record Number: [id number] Patient Account Number: [id number] Date of Birth/Sex: Treating RN: 01-Mar-1961 (60 y.o. Marcheta Grammes Primary Care Kailene Steinhart: PCP, NO Other Clinician: Referring Kismet Facemire: Treating Atiba Kimberlin/Extender: Cheree Ditto in Treatment: 4 Wound Status Wound Number: 1 Primary Pressure Ulcer Etiology: Wound Location: Right Gluteus Wound Status: Open Wounding Event: Pressure Injury Comorbid Coronary Artery Disease, Hypertension, Type II Diabetes, Date Acquired: 12/11/2020 History: Osteoarthritis Weeks Of Treatment: 4 Clustered Wound: No Photos Wound Measurements Length: (cm) 2.2 Width: (cm) 0.6 Depth: (cm) 5 Area: (cm) 1.037 Volume: (cm) 5.184 % Reduction in Area: 74.1% % Reduction in Volume: 76.5% Epithelialization: Medium (34-66%) Tunneling: No Undermining: No Wound Description Classification: Category/Stage III Wound Margin: Distinct, outline attached Exudate Amount: Large Exudate Type: Purulent Exudate Color: yellow, brown, green Foul Odor After Cleansing: No Slough/Fibrino No Wound Bed Granulation Amount: Large (67-100%) Exposed Structure Granulation Quality: Red, Pink Fascia Exposed: No Necrotic Amount: None Present (0%) Fat Layer (Subcutaneous Tissue) Exposed: Yes Tendon Exposed: No Muscle Exposed: No Joint Exposed: No Bone Exposed: No Treatment Notes Wound #1 (Gluteus) Wound Laterality:  Right Cleanser Normal Saline Discharge Instruction: Cleanse the wound with Normal Saline prior to applying a clean dressing using gauze sponges, not tissue or cotton balls. Wound Cleanser Discharge Instruction: Cleanse the wound with wound cleanser prior to applying a clean dressing using gauze sponges, not tissue or cotton balls. Peri-Wound Care Topical Primary Dressing KerraCel Ag Gelling Fiber Dressing, 4x5 in (silver alginate) Discharge Instruction: Apply silver alginate to wound bed, lightly pack undermining Secondary Dressing Woven Gauze Sponge, Non-Sterile 4x4 in Discharge Instruction: Apply over primary dressing as directed. ABD Pad, 5x9 Discharge Instruction: Apply over primary dressing as directed. Secured With 5M Medipore H Soft Cloth Surgical T 4 x 2 (in/yd) ape Discharge Instruction: Secure dressing with tape as directed. Compression Wrap Compression Stockings Add-Ons Electronic Signature(s) Signed: 04/07/2021 5:58:16 PM By: Lorrin Jackson Entered By: Lorrin Jackson on 04/07/2021 10:52:33 -------------------------------------------------------------------------------- Vitals Details Patient Name: Date of Service: [name], [name] [place] 04/07/2021 10:15 A M Medical Record Number: [id number] Patient Account Number: [id number] Date of Birth/Sex: Treating RN: 01-Jul-1961 (61 y.o. Marcheta Grammes Primary Care Gokul Waybright: PCP, NO Other Clinician: Referring Breella Vanostrand: Treating Meilin Brosh/Extender: Cheree Ditto in Treatment: 4 Vital Signs Time Taken: 10:45 Temperature (F): 97.9 Height (in): 73 Pulse (bpm): 90 Weight (lbs): 248 Respiratory Rate (breaths/min): 18 Body Mass Index (BMI): 32.7 Blood Pressure (mmHg): 121/82 Reference Range: 80 - 120 mg / dl Electronic Signature(s) Signed: 04/07/2021 5:58:16 PM By: Lorrin Jackson Entered By: Lorrin Jackson on 04/07/2021 10:45:36

## 2021-04-07 NOTE — Progress Notes (Signed)
[name], [name] ([id number]) Visit Report for 04/07/2021 HPI Details Patient Name: Date of Service: [name] LV IN 04/07/2021 10:15 [name] M Medical Record Number: [id number] Patient Account Number: [id number] Date of Birth/Sex: Treating RN: June 01, 1961 (60 y.o. Elizebeth Koller Primary Care Provider: PCP, NO Other Clinician: Referring Provider: Treating Provider/Extender: Valentino Saxon in Treatment: 4 History of Present Illness HPI Description: ADMISSION 03/10/2021 This is [name] 60 year old man who had became acutely ill largely as [name] result of urosepsis with delirium and had [name] prolonged hospitalization from 12/10/2020 through 02/02/2021. In brief he was septic with septic shock with E. coli bacteremia. He required intubation. Developed MSSA pneumonia and later in the course septic thrombophlebitis and coag negative staph sepsis. 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 During the course of his hospitalization he developed [name] right buttock pressure ulcer which was defined in the discharge problem list. He was subsequently sent to [facility] and was returned home 2 weeks ago. They do not have any insurance [Medicaid pending] in spite of this based on the picture on the patient's cell phone they have done an amazing job with getting this wound to look smaller. There is still [name] tunneling area. The patient is not incontinent eating and drinking well. His wife changing the dressing 3 times [name] day. 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 We have been using silver alginate. Minimal amount of drainage no evidence of infection. He now has Medicaid according to his wife but unfortunately they do not pay much in terms of wound care  supplies and I talked to her about this today 7/27; this is [name] patient with [name] deep tunneling pressure ulcer in the lower sacrum and coccyx. We have been using silver alginate packing. Down to 5 cm of direct depth. However he has more clear yellow drainage although he is not complaining of pain. No systemic symptoms Electronic Signature(s) Signed: 04/07/2021 4:50:28 PM By: Baltazar Najjar MD Entered By: Baltazar Najjar on 04/07/2021 11:10:38 -------------------------------------------------------------------------------- Physical Exam Details Patient Name: Date of Service: [name] LV IN 04/07/2021 10:15 [name] M Medical Record Number: [id number] Patient Account Number: [id number] Date of Birth/Sex: Treating RN: Jan 25, 1961 (60 y.o. Elizebeth Koller Primary Care Provider: PCP, NO Other Clinician: Referring Provider: Treating Provider/Extender: Valentino Saxon in Treatment: 4 Constitutional Sitting or standing Blood Pressure is within target range for patient.. Pulse regular and within target range for patient.Marland Kitchen Respirations regular, non-labored and within target range.. Temperature is normal and within the target range for the patient.Marland Kitchen Appears in no distress. Notes Wound exam; right buttock in close proximity to the sacral area. His wife is concerned about swelling in the area and indeed palpation superiorly results and clear yellow drainage. I therefore went ahead and cultured this although there does not appear to be any erythema and no tenderness. Direct depth at 5 cm which is [name] gradual improvement Electronic Signature(s) Signed: 04/07/2021 4:50:28 PM By: Baltazar Najjar MD Entered By: Baltazar Najjar on 04/07/2021 11:11:38 -------------------------------------------------------------------------------- Physician Orders Details Patient Name: Date of Service: [name], [place] LV IN 04/07/2021 10:15 [name] M Medical Record Number: [id number] Patient Account Number: [id number] Date of  Birth/Sex: Treating RN: [birth year] (60 y.o. 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 Bathing/ Shower/ Hygiene May shower and wash wound with soap and water. - prior to dressing change Off-Loading Gel wheelchair cushion Turn and reposition every 2 hours Wound Treatment Wound #1 - Gluteus Wound Laterality: Right Cleanser: Normal Saline Discharge Instructions: Cleanse the wound with Normal Saline prior to applying [name] clean dressing using gauze sponges, not tissue or cotton balls. Cleanser: Wound Cleanser Discharge Instructions: Cleanse the wound with wound cleanser prior to applying [name] clean dressing using gauze sponges, not tissue or cotton balls. Prim Dressing: KerraCel Ag Gelling Fiber Dressing, 4x5 in (silver alginate) ary Discharge Instructions: Apply silver alginate to wound bed, lightly pack undermining Secondary Dressing: Woven Gauze Sponge, Non-Sterile 4x4 in Discharge Instructions: Apply over primary dressing as directed. Secondary Dressing: ABD Pad, 5x9 Discharge Instructions: Apply over primary dressing as directed. Secured With: 67M Medipore H Soft Cloth Surgical T 4 x 2 (in/yd) ape Discharge Instructions: Secure dressing with tape as directed. Laboratory naerobe culture (MICRO) - (ICD10 L89.313 - Pressure ulcer of right buttock, stage 3) Bacteria identified in Unspecified specimen by [name] LOINC Code: 635-3 Convenience Name: Anerobic culture Electronic Signature(s) Signed: 04/07/2021 4:50:28 PM By: Baltazar Najjarobson, Oakley Orban MD Signed: 04/07/2021 5:58:16 PM By: Antonieta IbaBarnhart, Jodi Entered By: Antonieta IbaBarnhart, Jodi on 04/07/2021  11:04:18 -------------------------------------------------------------------------------- Problem List Details Patient Name: Date of Service: [name], [name] LV IN 04/07/2021 10:15 [name] M Medical Record Number: [id number] Patient Account Number: [id number] Date of Birth/Sex: Treating RN: 07-13-1961 (60 y.o. Lytle MichaelsM) Barnhart, Jodi Primary Care Provider: PCP, NO Other Clinician: Referring Provider: Treating Provider/Extender: Valentino Saxonobson, Klynn Linnemann Weeks in Treatment: 4 Active Problems ICD-10 Encounter Code Description Active Date MDM Diagnosis L89.313 Pressure ulcer of right buttock, stage 3 03/10/2021 No Yes Inactive Problems Resolved Problems Electronic Signature(s) Signed: 04/07/2021 4:50:28 PM By: Baltazar Najjarobson, Hasten Sweitzer MD Entered By: Baltazar Najjarobson, Jazmin Ley on 04/07/2021 11:09:39 -------------------------------------------------------------------------------- Progress Note Details Patient Name: Date of Service: [name], [name] LV IN 04/07/2021 10:15 [name] M Medical Record Number: [id number] Patient Account Number: [id number] Date of Birth/Sex: Treating RN: 07-13-1961 (60 y.o. Elizebeth KollerM) Lynch, Shatara Primary Care Provider: PCP, NO Other Clinician: Referring Provider: Treating Provider/Extender: Valentino Saxonobson, Khalib Fendley Weeks in Treatment: 4 Subjective History of Present Illness (HPI) ADMISSION 03/10/2021 This is [name] 60 year old man who had became acutely ill largely as [name] result of urosepsis with delirium and had [name] prolonged hospitalization from 12/10/2020 through 02/02/2021. In brief he was septic with septic shock with E. coli bacteremia. He required intubation. Developed MSSA pneumonia and later in the course septic thrombophlebitis and coag negative staph sepsis. 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 During the course of his hospitalization he developed [name] right buttock pressure ulcer which was defined in the discharge problem list. [facility] was [facility] home 2 weeks ago. They do not  have any insurance [Medicaid pending] in spite of this based on the picture on the patient's cell phone they have done an amazing job with getting this wound to look smaller. There is still [name] tunneling area. The patient is not incontinent eating and drinking well. His wife changing the dressing 3 times [name] day. Other past medical history includes type 2 diabetes, hypertension, coronary artery disease, 5 cm abdominal aortic aneurysm and he had [name] ureteral stent placed on 12/16/2020 in the early part of his hospitalization 7/6; this is  [name] patient with [name] deep tunneling pressure ulcer in the lower sacrum/coccyx area. We have been using silver alginate. Minimal amount of drainage no evidence of infection. He now has Medicaid according to his wife but unfortunately they do not pay much in terms of wound care supplies and I talked to her about this today 7/27; this is [name] patient with [name] deep tunneling pressure ulcer in the lower sacrum and coccyx. We have been using silver alginate packing. Down to 5 cm of direct depth. However he has more clear yellow drainage although he is not complaining of pain. No systemic symptoms Objective Constitutional Sitting or standing Blood Pressure is within target range for patient.. Pulse regular and within target range for patient.Marland Kitchen Respirations regular, non-labored and within target range.. Temperature is normal and within the target range for the patient.Marland Kitchen Appears in no distress. Vitals Time Taken: 10:45 AM, Height: 73 in, Weight: 248 lbs, BMI: 32.7, Temperature: 97.9 F, Pulse: 90 bpm, Respiratory Rate: 18 breaths/min, Blood Pressure: 121/82 mmHg. General Notes: Wound exam; right buttock in close proximity to the sacral area. His wife is concerned about swelling in the area and indeed palpation superiorly results and clear yellow drainage. I therefore went ahead and cultured this although there does not appear to be any erythema and no tenderness. Direct depth at 5 cm which  is [name] gradual improvement Integumentary (Hair, Skin) Wound #1 status is Open. Original cause of wound was Pressure Injury. The date acquired was: 12/11/2020. The wound has been in treatment 4 weeks. The wound is located on the Right Gluteus. The wound measures 2.2cm length x 0.6cm width x 5cm depth; 1.037cm^2 area and 5.184cm^3 volume. There is Fat Layer (Subcutaneous Tissue) exposed. There is no tunneling or undermining noted. There is [name] large amount of purulent drainage noted. The wound margin is distinct with the outline attached to the wound base. There is large (67-100%) red, pink granulation within the wound bed. There is no necrotic tissue within the wound bed. Assessment Active Problems ICD-10 Pressure ulcer of right buttock, stage 3 Plan Follow-up Appointments: Return Appointment in 2 weeks. Bathing/ Shower/ Hygiene: May shower and wash wound with soap and water. - prior to dressing change Off-Loading: Gel wheelchair cushion Turn and reposition every 2 hours Laboratory ordered were: Anerobic culture WOUND #1: - Gluteus Wound Laterality: Right Cleanser: Normal Saline Discharge Instructions: Cleanse the wound with Normal Saline prior to applying [name] clean dressing using gauze sponges, not tissue or cotton balls. Cleanser: Wound Cleanser Discharge Instructions: Cleanse the wound with wound cleanser prior to applying [name] clean dressing using gauze sponges, not tissue or cotton balls. Prim Dressing: KerraCel Ag Gelling Fiber Dressing, 4x5 in (silver alginate) ary Discharge Instructions: Apply silver alginate to wound bed, lightly pack undermining Secondary Dressing: Woven Gauze Sponge, Non-Sterile 4x4 in Discharge Instructions: Apply over primary dressing as directed. Secondary Dressing: ABD Pad, 5x9 Discharge Instructions: Apply over primary dressing as directed. Secured With: 32M Medipore H Soft Cloth Surgical T 4 x 2 (in/yd) ape Discharge Instructions: Secure dressing with tape as  directed. 1. I have cultured this area but no empiric antibiotics. 2 this does not go to bone I have not imaged this area but if things deteriorate I think we should go ahead and do that. 3. Continue with silver alginate his wife is doing the dressing. 4. The patient has Medicaid therefore we have to be judicious about cost Electronic Signature(s) Signed: 04/07/2021 4:50:28 PM By: Baltazar Najjar MD Entered By: Baltazar Najjar  on 04/07/2021 11:12:43 -------------------------------------------------------------------------------- SuperBill Details Patient Name: Date of Service: [place], [place] LV IN 04/07/2021 Medical Record Number: [id number] Patient Account Number: [id number] Date of Birth/Sex: Treating RN: 11-28-1960 (60 y.o. Lytle Michaels Primary Care Provider: PCP, NO Other Clinician: Referring Provider: Treating Provider/Extender: Valentino Saxon in Treatment: 4 Diagnosis Coding ICD-10 Codes Code Description L89.313 Pressure ulcer of right buttock, stage 3 Facility Procedures CPT4 Code: [id number] Description: 99213 - WOUND CARE VISIT-LEV 3 EST PT Modifier: Quantity: 1 Physician Procedures : CPT4 Code Description Modifier [id number] - WC PHYS LEVEL 3 - EST PT ICD-10 Diagnosis Description L89.313 Pressure ulcer of right buttock, stage 3 Quantity: 1 Electronic Signature(s) Signed: 04/07/2021 4:50:28 PM By: Baltazar Najjar MD Entered By: Baltazar Najjar on 04/07/2021 11:13:03

## 2021-04-10 LAB — AEROBIC CULTURE W GRAM STAIN (SUPERFICIAL SPECIMEN)

## 2021-04-20 ENCOUNTER — Encounter (HOSPITAL_BASED_OUTPATIENT_CLINIC_OR_DEPARTMENT_OTHER): Payer: Medicaid Other | Attending: Physician Assistant | Admitting: Physician Assistant

## 2021-04-20 ENCOUNTER — Other Ambulatory Visit: Payer: Self-pay

## 2021-04-20 DIAGNOSIS — I714 Abdominal aortic aneurysm, without rupture: Secondary | ICD-10-CM | POA: Diagnosis not present

## 2021-04-20 DIAGNOSIS — I2510 Atherosclerotic heart disease of native coronary artery without angina pectoris: Secondary | ICD-10-CM | POA: Diagnosis not present

## 2021-04-20 DIAGNOSIS — I69354 Hemiplegia and hemiparesis following cerebral infarction affecting left non-dominant side: Secondary | ICD-10-CM | POA: Diagnosis not present

## 2021-04-20 DIAGNOSIS — I10 Essential (primary) hypertension: Secondary | ICD-10-CM | POA: Diagnosis not present

## 2021-04-20 DIAGNOSIS — Z8619 Personal history of other infectious and parasitic diseases: Secondary | ICD-10-CM | POA: Insufficient documentation

## 2021-04-20 DIAGNOSIS — E11622 Type 2 diabetes mellitus with other skin ulcer: Secondary | ICD-10-CM | POA: Diagnosis present

## 2021-04-20 DIAGNOSIS — L89313 Pressure ulcer of right buttock, stage 3: Secondary | ICD-10-CM | POA: Diagnosis not present

## 2021-04-20 NOTE — Progress Notes (Addendum)
ESDRAS, DELAIR ([id number]) Visit Report for 04/20/2021 Chief Complaint Document Details Patient Name: Date of Service: [name], [name] LV [place] 04/20/2021 10:00 A M Medical Record Number: [id number] Patient Account Number: [id number] Date of Birth/Sex: Treating RN: 06-20-1961 (60 y.o. [name] Primary Care Provider: PCP, NO Other Clinician: Referring Provider: Treating Provider/Extender: Skeet Simmer in Treatment: 5 Information Obtained from: Patient Chief Complaint 03/10/2021; patient is here for review of her pressure ulcer on the right buttock Electronic Signature(s) Signed: 04/20/2021 10:15:25 AM By: Lenda Kelp PA-C Entered By: Lenda Kelp on 04/20/2021 10:15:25 -------------------------------------------------------------------------------- HPI Details Patient Name: Date of Service: [name], [name] LV [place] 04/20/2021 10:00 A M Medical Record Number: [id number] Patient Account Number: [id number] Date of Birth/Sex: Treating RN: [birth year] (27 y.o. [name] Primary Care Provider: PCP, NO Other Clinician: Referring Provider: Treating Provider/Extender: Skeet Simmer in Treatment: 5 History of Present Illness HPI Description: ADMISSION 03/10/2021 This is a 60 year old man who had became acutely ill largely as a result of urosepsis with delirium and had a prolonged hospitalization from 12/10/2020 through 02/02/2021. In brief he was septic with septic shock with E. coli bacteremia. He required intubation. Developed MSSA pneumonia and later in the course septic thrombophlebitis and coag negative staph sepsis. 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 During the course of his hospitalization he developed a right buttock pressure ulcer which was defined in the discharge problem list. He was subsequently sent to [facility] and was returned home 2 weeks ago. They do not have any insurance [Medicaid pending] in spite of this based on the picture on the  patient's cell phone they have done an amazing job with getting this wound to look smaller. There is still a tunneling area. The patient is not incontinent eating and drinking well. His wife changing the dressing 3 times a day. Other past medical history includes type 2 diabetes, hypertension, coronary artery disease, 5 cm abdominal aortic aneurysm and he had a ureteral stent placed on 12/16/2020 in the early part of his hospitalization 7/6; this is a patient with a deep tunneling pressure ulcer in the lower sacrum/coccyx area. We have been using silver alginate. Minimal amount of drainage no evidence of infection. He now has Medicaid according to his wife but unfortunately they do not pay much in terms of wound care supplies and I talked to her about this today 7/27; this is a patient with a deep tunneling pressure ulcer in the lower sacrum and coccyx. We have been using silver alginate packing. Down to 5 cm of direct depth. However he has more clear yellow drainage although he is not complaining of pain. No systemic symptoms 04/20/2021 upon evaluation today patient appears to be doing better in regard to his wound in general. With that being said I do not see any signs of active infection at this time that is obvious. With that being said I think the biggest issue here that his wife started and is talking to me about as far as the "pus" that she is noticing is that were not really able to pack appropriately into the tunnel region. Right now he has an area that goes indirectly and then cuts upwards towards 12:00. In that 12:00 tunnel I think she is not really able to get into here because she tells me that whenever she changes the dressing it tends to be that the alginate that she put in is just sitting on the dressing outside and not no  longer in the tunnel. Subsequently I think that a packing strip may be better at this point to work as a wick and actually come in contact with the base of the wound at  least if she has any chance of packing again this is probably much better. In general however I think what is happening is fluid just collected in and behind this region and then subsequently starts coming out after getting to a certain point of collection. I believe that is what she is noticing there really was nothing dramatic on the culture to indicate an active infection at this point to be honest. Electronic Signature(s) Signed: 04/21/2021 8:29:46 AM By: Lenda Kelp PA-C Entered By: Lenda Kelp on 04/21/2021 08:29:46 -------------------------------------------------------------------------------- Physical Exam Details Patient Name: Date of Service: [name], [name] LV IN 04/20/2021 10:00 A M Medical Record Number: [id number] Patient Account Number: [id number] Date of Birth/Sex: Treating RN: [birth year] (67 y.o. [name] Primary Care Provider: PCP, NO Other Clinician: Referring Provider: Treating Provider/Extender: Skeet Simmer in Treatment: 5 Constitutional Well-nourished and well-hydrated in no acute distress. Respiratory normal breathing without difficulty. Psychiatric this patient is able to make decisions and demonstrates good insight into disease process. Alert and Oriented x 3. pleasant and cooperative. Notes Upon inspection patient's wound bed again showed signs of a region of depth and then tunneling that goes more around the 12:00 location. This is not a very easy tunnel to get into and I think that is part of the issue here that we are seeing is that the packing is not easily accomplished. Fortunately there does not appear to be any signs of obvious erythema or infection externally and though he does have drainage this is to be expected to be honest. Electronic Signature(s) Signed: 04/21/2021 8:30:20 AM By: Lenda Kelp PA-C Entered By: Lenda Kelp on 04/21/2021  08:30:19 -------------------------------------------------------------------------------- Physician Orders Details Patient Name: Date of Service: [name], [name] LV IN 04/20/2021 10:00 A M Medical Record Number: [id number] Patient Account Number: [id number] Date of Birth/Sex: Treating RN: Jan 23, 1961 (22 y.o. [name] Primary Care Provider: PCP, NO Other Clinician: Referring Provider: Treating Provider/Extender: Skeet Simmer in Treatment: 5 Verbal / Phone Orders: No Diagnosis Coding ICD-10 Coding Code Description L89.313 Pressure ulcer of right buttock, stage 3 Follow-up Appointments ppointment in 2 weeks. - Dr. Leanord Hawking Return A Bathing/ Shower/ Hygiene May shower and wash wound with soap and water. - prior to dressing change Off-Loading Gel wheelchair cushion Turn and reposition every 2 hours Wound Treatment Wound #1 - Gluteus Wound Laterality: Right Cleanser: Normal Saline (DME) (Generic) 1 x Per Day/15 Days Discharge Instructions: Cleanse the wound with Normal Saline prior to applying a clean dressing using gauze sponges, not tissue or cotton balls. Cleanser: Wound Cleanser (DME) (Generic) 1 x Per Day/15 Days Discharge Instructions: Cleanse the wound with wound cleanser prior to applying a clean dressing using gauze sponges, not tissue or cotton balls. Prim Dressing: Iodoform packing strip 1/4 (in) (DME) (Generic) 1 x Per Day/15 Days ary Discharge Instructions: Lightly pack as instructed Secondary Dressing: Woven Gauze Sponge, Non-Sterile 4x4 in (DME) (Generic) 1 x Per Day/15 Days Discharge Instructions: Apply over primary dressing as directed. Secondary Dressing: ABD Pad, 5x9 (DME) (Generic) 1 x Per Day/15 Days Discharge Instructions: Apply over primary dressing as directed. Secured With: 28M Medipore H Soft Cloth Surgical T 4 x 2 (in/yd) (DME) (Generic) 1 x Per Day/15 Days ape Discharge Instructions: Secure dressing with tape as directed. Electronic  Signature(s) Signed: 04/20/2021 5:35:30 PM By: Fonnie Mu RN Signed: 04/22/2021 5:47:57 PM By: Lenda Kelp PA-C Entered By: Fonnie Mu on 04/20/2021 10:49:27 -------------------------------------------------------------------------------- Problem List Details Patient Name: Date of Service: [name], [name] LV IN 04/20/2021 10:00 A M Medical Record Number: [id number] Patient Account Number: [id number] Date of Birth/Sex: Treating RN: 10-06-1960 (26 y.o. [name] Primary Care Provider: PCP, NO Other Clinician: Referring Provider: Treating Provider/Extender: Skeet Simmer in Treatment: 5 Active Problems ICD-10 Encounter Code Description Active Date MDM Diagnosis L89.313 Pressure ulcer of right buttock, stage 3 03/10/2021 No Yes Inactive Problems Resolved Problems Electronic Signature(s) Signed: 04/20/2021 10:15:18 AM By: Lenda Kelp PA-C Entered By: Lenda Kelp on 04/20/2021 10:15:18 -------------------------------------------------------------------------------- Progress Note Details Patient Name: Date of Service: [name], [name] LV IN 04/20/2021 10:00 A M Medical Record Number: [id number] Patient Account Number: [id number] Date of Birth/Sex: Treating RN: 01-Sep-1961 (67 y.o. [name] Primary Care Provider: PCP, NO Other Clinician: Referring Provider: Treating Provider/Extender: Skeet Simmer in Treatment: 5 Subjective Chief Complaint Information obtained from Patient 03/10/2021; patient is here for review of her pressure ulcer on the right buttock History of Present Illness (HPI) ADMISSION 03/10/2021 This is a 60 year old man who had became acutely ill largely as a result of urosepsis with delirium and had a prolonged hospitalization from 12/10/2020 through 02/02/2021. In brief he was septic with septic shock with E. coli bacteremia. He required intubation. Developed MSSA pneumonia and later in the course septic thrombophlebitis and  coag negative staph sepsis. 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 During the course of his hospitalization he developed a right buttock pressure ulcer which was defined in the discharge problem list. He was subsequently sent to [facility] and was returned home 2 weeks ago. They do not have any insurance [Medicaid pending] in spite of this based on the picture on the patient's cell phone they have done an amazing job with getting this wound to look smaller. There is still a tunneling area. The patient is not incontinent eating and drinking well. His wife changing the dressing 3 times a day. Other past medical history includes type 2 diabetes, hypertension, coronary artery disease, 5 cm abdominal aortic aneurysm and he had a ureteral stent placed on 12/16/2020 in the early part of his hospitalization 7/6; this is a patient with a deep tunneling pressure ulcer in the lower sacrum/coccyx area. We have been using silver alginate. Minimal amount of drainage no evidence of infection. He now has Medicaid according to his wife but unfortunately they do not pay much in terms of wound care supplies and I talked to her about this today 7/27; this is a patient with a deep tunneling pressure ulcer in the lower sacrum and coccyx. We have been using silver alginate packing. Down to 5 cm of direct depth. However he has more clear yellow drainage although he is not complaining of pain. No systemic symptoms 04/20/2021 upon evaluation today patient appears to be doing better in regard to his wound in general. With that being said I do not see any signs of active infection at this time that is obvious. With that being said I think the biggest issue here that his wife started and is talking to me about as far as the "pus" that she is noticing is that were not really able to pack appropriately into the tunnel region. Right now he has an area that goes indirectly and then cuts upwards towards 12:00. In that  12:00 tunnel I think she is not really able to get into here because she tells me that whenever she changes the dressing it tends to be that the alginate that she put in is just sitting on the dressing outside and not no longer in the tunnel. Subsequently I think that a packing strip may be better at this point to work as a wick and actually come in contact with the base of the wound at least if she has any chance of packing again this is probably much better. In general however I think what is happening is fluid just collected in and behind this region and then subsequently starts coming out after getting to a certain point of collection. I believe that is what she is noticing there really was nothing dramatic on the culture to indicate an active infection at this point to be honest. Objective Constitutional Well-nourished and well-hydrated in no acute distress. Vitals Time Taken: 10:08 AM, Height: 73 in, Weight: 248 lbs, BMI: 32.7, Temperature: 97.5 F, Pulse: 90 bpm, Respiratory Rate: 20 breaths/min, Blood Pressure: 143/91 mmHg. Respiratory normal breathing without difficulty. Psychiatric this patient is able to make decisions and demonstrates good insight into disease process. Alert and Oriented x 3. pleasant and cooperative. General Notes: Upon inspection patient's wound bed again showed signs of a region of depth and then tunneling that goes more around the 12:00 location. This is not a very easy tunnel to get into and I think that is part of the issue here that we are seeing is that the packing is not easily accomplished. Fortunately there does not appear to be any signs of obvious erythema or infection externally and though he does have drainage this is to be expected to be honest. Integumentary (Hair, Skin) Wound #1 status is Open. Original cause of wound was Pressure Injury. The date acquired was: 12/11/2020. The wound has been in treatment 5 weeks. The wound is located on the Right  Gluteus. The wound measures 0.5cm length x 0.3cm width x 1.7cm depth; 0.118cm^2 area and 0.2cm^3 volume. There is Fat Layer (Subcutaneous Tissue) exposed. There is no undermining noted, however, there is tunneling at 12:00 with a maximum distance of 5cm. There is a medium amount of serosanguineous drainage noted. The wound margin is distinct with the outline attached to the wound base. There is large (67-100%) red, pink granulation within the wound bed. There is no necrotic tissue within the wound bed. Assessment Active Problems ICD-10 Pressure ulcer of right buttock, stage 3 Plan Follow-up Appointments: Return Appointment in 2 weeks. - Dr. Leanord Hawkingobson Bathing/ Shower/ Hygiene: May shower and wash wound with soap and water. - prior to dressing change Off-Loading: Gel wheelchair cushion Turn and reposition every 2 hours WOUND #1: - Gluteus Wound Laterality: Right Cleanser: Normal Saline (DME) (Generic) 1 x Per Day/15 Days Discharge Instructions: Cleanse the wound with Normal Saline prior to applying a clean dressing using gauze sponges, not tissue or cotton balls. Cleanser: Wound Cleanser (DME) (Generic) 1 x Per Day/15 Days Discharge Instructions: Cleanse the wound with wound cleanser prior to applying a clean dressing using gauze sponges, not tissue or cotton balls. Prim Dressing: Iodoform packing strip 1/4 (in) (DME) (Generic) 1 x Per Day/15 Days ary Discharge Instructions: Lightly pack as instructed Secondary Dressing: Woven Gauze Sponge, Non-Sterile 4x4 in (DME) (Generic) 1 x Per Day/15 Days Discharge Instructions: Apply over primary dressing as directed. Secondary Dressing: ABD Pad, 5x9 (DME) (Generic) 1 x Per Day/15 Days Discharge Instructions: Apply over primary  dressing as directed. Secured With: 19M Medipore H Soft Cloth Surgical T 4 x 2 (in/yd) (DME) (Generic) 1 x Per Day/15 Days ape Discharge Instructions: Secure dressing with tape as directed. 1. Would recommend currently that we  go ahead and initiate continuation of treatment with iodoform packing gauze quarter-inch Emina see if this will be easier for his wife to get in. We do not [name] it over packed but I do [name] to try to get this to the base of the wound if at all possible. 2. I would recommend an ABD pad to cover secured with tape. 3. I would recommend they continue to monitor for any signs of worsening or evidence of obvious infection if anything occurs he should let me know soon as possible. We will see patient back for reevaluation in 1 week here in the clinic. If anything worsens or changes patient will contact our office for additional recommendations. Electronic Signature(s) Signed: 04/21/2021 8:31:00 AM By: Lenda Kelp PA-C Entered By: Lenda Kelp on 04/21/2021 08:31:00 -------------------------------------------------------------------------------- SuperBill Details Patient Name: Date of Service: [name], [name] LV IN 04/20/2021 Medical Record Number: [id number] Patient Account Number: [id number] Date of Birth/Sex: Treating RN: Feb 24, 1961 (22 y.o. [name] Primary Care Provider: PCP, NO Other Clinician: Referring Provider: Treating Provider/Extender: Skeet Simmer in Treatment: 5 Diagnosis Coding ICD-10 Codes Code Description L89.313 Pressure ulcer of right buttock, stage 3 Facility Procedures CPT4 Code: [id number] Description: 99213 - WOUND CARE VISIT-LEV 3 EST PT Modifier: Quantity: 1 Physician Procedures : CPT4 Code Description Modifier [id number] 99214 - WC PHYS LEVEL 4 - EST PT ICD-10 Diagnosis Description ICD-10 Diagnosis Description L89.313 Pressure ulcer of right buttock, stage 3 Quantity: 1 Electronic Signature(s) Signed: 04/21/2021 8:31:22 AM By: Lenda Kelp PA-C Previous Signature: 04/20/2021 5:35:30 PM Version By: Fonnie Mu RN Entered By: Lenda Kelp on 04/21/2021 08:31:21

## 2021-04-27 NOTE — Progress Notes (Signed)
LUCILLE, CRICHLOW ([id number]) Visit Report for 04/20/2021 Arrival Information Details Patient Name: Date of Service: ARRIN, PINTOR LV IN 04/20/2021 10:00 [place] Number: [id number] Patient Account Number: [id number] Date of Birth/Sex: Treating RN: [birth year] (60 y.o. Lorette Ang, Meta.Reding Primary Care Takya Vandivier: PCP, NO Other Clinician: Referring Neela Zecca: Treating Chales Pelissier/Extender: Sharalyn Ink in Treatment: 5 Visit Information History Since Last Visit Added or deleted any medications: No Patient Arrived: Ambulatory Any new allergies or adverse reactions: No Arrival Time: 10:07 Had a fall or experienced change in No Accompanied By: wife activities of daily living that may affect Transfer Assistance: None risk of falls: Patient Identification Verified: Yes Signs or symptoms of abuse/neglect since last visito No Secondary Verification Process Completed: Yes Hospitalized since last visit: No Patient Requires Transmission-Based Precautions: No Implantable device outside of the clinic excluding No Patient Has Alerts: No cellular tissue based products placed in the center since last visit: Has Dressing in Place as Prescribed: Yes Pain Present Now: Yes Electronic Signature(s) Signed: 04/20/2021 5:41:18 PM By: Deon Pilling Entered By: Deon Pilling on 04/20/2021 10:12:00 -------------------------------------------------------------------------------- Clinic Level of Care Assessment Details Patient Name: Date of Service: JAQUE, DACY LV IN 04/20/2021 10:00 [place] Record Number: [id number] Patient Account Number: [id number] Date of Birth/Sex: Treating RN: December 18, 1960 (60 y.o. Burnadette Pop, Lauren Primary Care Cheryll Keisler: PCP, NO Other Clinician: Referring Jaylynn Siefert: Treating Goldie Dimmer/Extender: Sharalyn Ink in Treatment: 5 Clinic Level of Care Assessment Items TOOL 4 Quantity Score X- 1 0 Use when only an EandM is performed on FOLLOW-UP visit ASSESSMENTS - Nursing  Assessment / Reassessment X- 1 10 Reassessment of Co-morbidities (includes updates in patient status) X- 1 5 Reassessment of Adherence to Treatment Plan ASSESSMENTS - Wound and Skin A ssessment / Reassessment X - Simple Wound Assessment / Reassessment - one wound 1 5 _0  - 0 Complex Wound Assessment / Reassessment - multiple wounds _1  - 0 Dermatologic / Skin Assessment (not related to wound area) ASSESSMENTS - Focused Assessment _2  - 0 Circumferential Edema Measurements - multi extremities _3  - 0 Nutritional Assessment / Counseling / Intervention _4  - 0 Lower Extremity Assessment (monofilament, tuning fork, pulses) _5  - 0 Peripheral Arterial Disease Assessment (using hand held doppler) ASSESSMENTS - Ostomy and/or Continence Assessment and Care _6  - 0 Incontinence Assessment and Management _7  - 0 Ostomy Care Assessment and Management (repouching, etc.) PROCESS - Coordination of Care X - Simple Patient / Family Education for ongoing care 1 15 _8  - 0 Complex (extensive) Patient / Family Education for ongoing care X- 1 10 Staff obtains Programmer, systems, Records, T Results / Process Orders est _9  - 0 Staff telephones HHA, Nursing Homes / Clarify orders / etc _10  - 0 Routine Transfer to another Facility (non-emergent condition) _11  - 0 Routine Hospital Admission (non-emergent condition) _12  - 0 New Admissions / Biomedical engineer / Ordering NPWT Apligraf, etc. , _13  - 0 Emergency Hospital Admission (emergent condition) X- 1 10 Simple Discharge Coordination _14  - 0 Complex (extensive) Discharge Coordination PROCESS - Special Needs _15  - 0 Pediatric / Minor Patient Management _16  - 0 Isolation Patient Management _17  - 0 Hearing / Language / Visual special needs _18  - 0 Assessment of Community assistance (transportation, D/C planning, etc.) _19  - 0 Additional assistance / Altered mentation _20  - 0 Support Surface(s) Assessment (bed, cushion, seat, etc.) INTERVENTIONS - Wound  Cleansing / Measurement X - Simple Wound Cleansing - one wound 1 5 _21  - 0 Complex Wound Cleansing - multiple wounds X- 1 5  Wound Imaging (photographs - any number of wounds) _0  - 0 Wound Tracing (instead of photographs) X- 1 5 Simple Wound Measurement - one wound _1  - 0 Complex Wound Measurement - multiple wounds INTERVENTIONS - Wound Dressings X - Small Wound Dressing one or multiple wounds 1 10 _2  - 0 Medium Wound Dressing one or multiple wounds _3  - 0 Large Wound Dressing one or multiple wounds <NFAOZHYQMVHQIONG>_2<\/XBMWUXLKGMWNUUVO>_5  - 0 Application of Medications - topical <DGUYQIHKVQQVZDGL>_8<\/VFIEPPIRJJOACZYS>_0  - 0 Application of Medications - injection INTERVENTIONS - Miscellaneous _6  - 0 External ear exam _7  - 0 Specimen Collection (cultures, biopsies, blood, body fluids, etc.) _8  - 0 Specimen(s) / Culture(s) sent or taken to Lab for analysis _9  - 0 Patient Transfer (multiple staff / Civil Service fast streamer / Similar devices) _10  - 0 Simple Staple / Suture removal (25 or less) _11  - 0 Complex Staple / Suture removal (26 or more) _12  - 0 Hypo / Hyperglycemic Management (close monitor of Blood Glucose) _13  - 0 Ankle / Brachial Index (ABI) - do not check if billed separately X- 1 5 Vital Signs Has the patient been seen at the hospital within the last three years: Yes Total Score: 85 Level Of Care: New/Established - Level 3 Electronic Signature(s) Signed: 04/20/2021 5:35:30 PM By: Rhae Hammock RN Entered By: Rhae Hammock on 04/20/2021 10:45:36 -------------------------------------------------------------------------------- Encounter Discharge Information Details Patient Name: Date of Service: Barnabas Harries LV IN 04/20/2021 10:00 [place] Record Number: [id number] Patient Account Number: [id number] Date of Birth/Sex: Treating RN: [birth year] (60 y.o. Hessie Diener Primary Care Tilda Samudio: PCP, NO Other Clinician: Referring Ileane Sando: Treating Hamda Klutts/Extender: Sharalyn Ink in Treatment: 5 Encounter Discharge Information  Items Discharge Condition: Stable Ambulatory Status: [place] Discharge Destination: Home Transportation: Private Auto Accompanied By: wife Schedule Follow-up Appointment: Yes Clinical Summary of Care: Electronic Signature(s) Signed: 04/20/2021 5:41:18 PM By: Deon Pilling Entered By: Deon Pilling on 04/20/2021 11:19:15 -------------------------------------------------------------------------------- Lower Extremity Assessment Details Patient Name: Date of Service: KAIRI, TUFO LV IN 04/20/2021 10:00 [place] Number: [id number] Patient Account Number: [id number] Date of Birth/Sex: Treating RN: 09-24-1960 (60 y.o. Hessie Diener Primary Care Bryttany Tortorelli: PCP, NO Other Clinician: Referring Jushua Waltman: Treating Sindi Beckworth/Extender: Sharalyn Ink in Treatment: 5 Electronic Signature(s) Signed: 04/20/2021 5:41:18 PM By: Deon Pilling Entered By: Deon Pilling on 04/20/2021 10:13:49 -------------------------------------------------------------------------------- [place] Details Patient Name: Date of Service: EDRIS, FRIEDT LV IN 04/20/2021 10:00 [place] Number: [id number] Patient Account Number: [id number] Date of Birth/Sex: Treating RN: 03-29-1961 (59 y.o. Erie Noe Primary Care Tadeo Besecker: PCP, NO Other Clinician: Referring Pao Haffey: Treating Nevia Henkin/Extender: Sharalyn Ink in Treatment: 5 Multidisciplinary Care Plan reviewed with physician Active Inactive Nutrition Nursing Diagnoses: Impaired glucose control: actual or potential Potential for alteratiion in Nutrition/Potential for imbalanced nutrition Goals: Patient/caregiver agrees to and verbalizes understanding of need to use nutritional supplements and/or vitamins as prescribed Date Initiated: 03/10/2021 Date Inactivated: 04/07/2021 Target Resolution Date: 04/09/2021 Goal Status: Met Patient/caregiver will maintain therapeutic glucose control Date Initiated: 03/10/2021 Target  Resolution Date: 05/05/2021 Goal Status: Active Interventions: Assess HgA1c results as ordered upon admission and as needed Assess patient nutrition upon admission and as needed per policy Provide education on elevated blood sugars and impact on wound healing Provide education on nutrition Treatment Activities: Education provided on Nutrition : 03/10/2021 Notes: 04/07/21: Glucose control ongoing Pressure Nursing Diagnoses: Knowledge deficit related to causes and risk factors for pressure ulcer development Knowledge deficit related to management of pressures ulcers Potential for impaired tissue integrity  related to pressure, friction, moisture, and shear Goals: Patient will remain free from development of additional pressure ulcers Date Initiated: 03/10/2021 Target Resolution Date: 05/05/2021 Goal Status: Active Patient/caregiver will verbalize risk factors for pressure ulcer development Date Initiated: 03/10/2021 Date Inactivated: 04/07/2021 Target Resolution Date: 04/09/2021 Goal Status: Met Patient/caregiver will verbalize understanding of pressure ulcer management Date Initiated: 03/10/2021 Date Inactivated: 04/07/2021 Target Resolution Date: 04/09/2021 Goal Status: Met Interventions: Assess: immobility, friction, shearing, incontinence upon admission and as needed Assess offloading mechanisms upon admission and as needed Assess potential for pressure ulcer upon admission and as needed Provide education on pressure ulcers Notes: Wound/Skin Impairment Nursing Diagnoses: Impaired tissue integrity Knowledge deficit related to ulceration/compromised skin integrity Goals: Patient/caregiver will verbalize understanding of skin care regimen Date Initiated: 03/10/2021 Date Inactivated: 04/07/2021 Target Resolution Date: 04/09/2021 Goal Status: Met Ulcer/skin breakdown will have a volume reduction of 30% by week 4 Date Initiated: 03/10/2021 Date Inactivated: 04/07/2021 Target Resolution  Date: 04/09/2021 Goal Status: Met Ulcer/skin breakdown will have a volume reduction of 50% by week 8 Date Initiated: 04/07/2021 Target Resolution Date: 05/05/2021 Goal Status: Active Interventions: Assess patient/caregiver ability to obtain necessary supplies Assess patient/caregiver ability to perform ulcer/skin care regimen upon admission and as needed Assess ulceration(s) every visit Provide education on ulcer and skin care Notes: Electronic Signature(s) Signed: 04/20/2021 5:35:30 PM By: Rhae Hammock RN Entered By: Rhae Hammock on 04/20/2021 08:28:13 -------------------------------------------------------------------------------- Pain Assessment Details Patient Name: Date of Service: Barnabas Harries LV IN 04/20/2021 10:00 [place] Number: [id number] Patient Account Number: [id number] Date of Birth/Sex: Treating RN: Jan 31, 1961 (60 y.o. Hessie Diener Primary Care Demoni Gergen: PCP, NO Other Clinician: Referring Isak Sotomayor: Treating Jessee Mezera/Extender: Sharalyn Ink in Treatment: 5 Active Problems Location of Pain Severity and Description of Pain Patient Has Paino Yes Site Locations Pain Location: Generalized Pain Rate the pain. Current Pain Level: 7 Worst Pain Level: 10 Least Pain Level: 0 Tolerable Pain Level: 8 Character of Pain Describe the Pain: Aching, Heavy Pain Management and Medication Current Pain Management: Medication: Yes Cold Application: No Rest: Yes Massage: No Activity: No T.E.N.S.: No Heat Application: No Leg drop or elevation: No Is the Current Pain Management Adequate: Adequate How does your wound impact your activities of daily livingo Sleep: No Bathing: No Appetite: No Relationship With Others: No Bladder Continence: No Emotions: No Bowel Continence: No Work: No Toileting: No Drive: No Dressing: No Hobbies: No Electronic Signature(s) Signed: 04/20/2021 5:41:18 PM By: Deon Pilling Entered By: Deon Pilling on 04/20/2021  10:13:43 -------------------------------------------------------------------------------- Patient/Caregiver Education Details Patient Name: Date of Service: Barnabas Harries LV IN 8/9/2022andnbsp10:00 [place] Record Number: [id number] Patient Account Number: [id number] Date of Birth/Gender: Treating RN: 01-07-1961 (61 y.o. Erie Noe Primary Care Physician: PCP, NO Other Clinician: Referring Physician: Treating Physician/Extender: Sharalyn Ink in Treatment: 5 Education Assessment Education Provided To: Patient Education Topics Provided Elevated Blood Sugar/ Impact on Healing: Methods: Explain/Verbal Responses: State content correctly Electronic Signature(s) Signed: 04/20/2021 5:35:30 PM By: Rhae Hammock RN Entered By: Rhae Hammock on 04/20/2021 08:28:34 -------------------------------------------------------------------------------- Wound Assessment Details Patient Name: Date of Service: LILIANA, BRENTLINGER LV IN 04/20/2021 10:00 [place] Number: [id number] Patient Account Number: [id number] Date of Birth/Sex: Treating RN: [birth year] (60 y.o. Hessie Diener Primary Care Gill Delrossi: PCP, NO Other Clinician: Referring Oyuki Hogan: Treating Haeleigh Streiff/Extender: Sharalyn Ink in Treatment: 5 Wound Status Wound Number: 1 Primary Pressure Ulcer Etiology: Wound Location: Right Gluteus Wound Status: Open Wounding Event: Pressure Injury Comorbid Coronary Artery Disease,  Hypertension, Type II Diabetes, Date Acquired: 12/11/2020 History: Osteoarthritis Weeks Of Treatment: 5 Clustered Wound: No Photos Wound Measurements Length: (cm) 0.5 Width: (cm) 0.3 Depth: (cm) 1.7 Area: (cm) 0.118 Volume: (cm) 0.2 % Reduction in Area: 97.1% % Reduction in Volume: 99.1% Epithelialization: Medium (34-66%) Tunneling: Yes Position (o'clock): 12 Maximum Distance: (cm) 5 Undermining: No Wound Description Classification: Category/Stage III Wound Margin:  Distinct, outline attached Exudate Amount: Medium Exudate Type: Serosanguineous Exudate Color: red, brown Foul Odor After Cleansing: No Slough/Fibrino No Wound Bed Granulation Amount: Large (67-100%) Exposed Structure Granulation Quality: Red, Pink Fascia Exposed: No Necrotic Amount: None Present (0%) Fat Layer (Subcutaneous Tissue) Exposed: Yes Tendon Exposed: No Muscle Exposed: No Joint Exposed: No Bone Exposed: No Treatment Notes Wound #1 (Gluteus) Wound Laterality: Right Cleanser Normal Saline Discharge Instruction: Cleanse the wound with Normal Saline prior to applying a clean dressing using gauze sponges, not tissue or cotton balls. Wound Cleanser Discharge Instruction: Cleanse the wound with wound cleanser prior to applying a clean dressing using gauze sponges, not tissue or cotton balls. Peri-Wound Care Topical Primary Dressing Iodoform packing strip 1/4 (in) Discharge Instruction: Lightly pack as instructed Secondary Dressing Woven Gauze Sponge, Non-Sterile 4x4 in Discharge Instruction: Apply over primary dressing as directed. ABD Pad, 5x9 Discharge Instruction: Apply over primary dressing as directed. Secured With 68M Medipore H Soft Cloth Surgical T 4 x 2 (in/yd) ape Discharge Instruction: Secure dressing with tape as directed. Compression Wrap Compression Stockings Add-Ons Electronic Signature(s) Signed: 04/20/2021 5:41:18 PM By: Deon Pilling Signed: 04/27/2021 11:33:27 AM By: Sandre Kitty Entered By: Sandre Kitty on 04/20/2021 10:21:11 -------------------------------------------------------------------------------- Vitals Details Patient Name: Date of Service: Jasmine Pang, A LV IN 04/20/2021 10:00 [place] Number: [id number] Patient Account Number: [id number] Date of Birth/Sex: Treating RN: [birth year] (60 y.o. Hessie Diener Primary Care Nathanel Tallman: PCP, NO Other Clinician: Referring Nox Talent: Treating Taviana Westergren/Extender: Sharalyn Ink in Treatment: 5 Vital Signs Time Taken: 10:08 Temperature (F): 97.5 Height (in): 73 Pulse (bpm): 90 Weight (lbs): 248 Respiratory Rate (breaths/min): 20 Body Mass Index (BMI): 32.7 Blood Pressure (mmHg): 143/91 Reference Range: 80 - 120 mg / dl Electronic Signature(s) Signed: 04/20/2021 5:41:18 PM By: Deon Pilling Entered By: Deon Pilling on 04/20/2021 10:13:20

## 2021-05-04 ENCOUNTER — Other Ambulatory Visit: Payer: Self-pay

## 2021-05-04 ENCOUNTER — Encounter (HOSPITAL_BASED_OUTPATIENT_CLINIC_OR_DEPARTMENT_OTHER): Payer: Medicaid Other | Admitting: Internal Medicine

## 2021-05-04 DIAGNOSIS — E11622 Type 2 diabetes mellitus with other skin ulcer: Secondary | ICD-10-CM | POA: Diagnosis not present

## 2021-05-05 NOTE — Progress Notes (Signed)
[name], [name] ([id number]) Visit Report for 05/04/2021 HPI Details Patient Name: Date of Service: [name], [name] LV IN 05/04/2021 9:45 A M Medical Record Number: [id number] Patient Account Number: [id number] Date of Birth/Sex: Treating RN: 1961/05/26 (60 y.o. [name] Primary Care Provider: PCP, NO Other Clinician: Referring Provider: Treating Provider/Extender: [name] in Treatment: 7 History of Present Illness HPI Description: ADMISSION 03/10/2021 This is a 60 year old man who had became acutely ill largely as a result of urosepsis with delirium and had a prolonged hospitalization from 12/10/2020 through 02/02/2021. In brief he was septic with septic shock with E. coli bacteremia. He required intubation. Developed MSSA pneumonia and later in the course septic thrombophlebitis and coag negative staph sepsis. 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 During the course of his hospitalization he developed a right buttock pressure ulcer which was defined in the discharge problem list. He was subsequently sent to [facility] and was returned home 2 weeks ago. They do not have any insurance [Medicaid pending] in spite of this based on the picture on the patient's cell phone they have done an amazing job with getting this wound to look smaller. There is still a tunneling area. The patient is not incontinent eating and drinking well. His wife changing the dressing 3 times a day. Other past medical history includes type 2 diabetes, hypertension, coronary artery disease, 5 cm abdominal aortic aneurysm and he had a ureteral stent placed on 12/16/2020 in the early part of his hospitalization 7/6; this is a patient with a deep tunneling pressure ulcer in the lower sacrum/coccyx area. We have been using silver alginate. Minimal amount of drainage no evidence of infection. He now has Medicaid according to his wife but unfortunately they do not pay much in terms of wound care  supplies and I talked to her about this today 7/27; this is a patient with a deep tunneling pressure ulcer in the lower sacrum and coccyx. We have been using silver alginate packing. Down to 5 cm of direct depth. However he has more clear yellow drainage although he is not complaining of pain. No systemic symptoms 04/20/2021 upon evaluation today patient appears to be doing better in regard to his wound in general. With that being said I do not see any signs of active infection at this time that is obvious. With that being said I think the biggest issue here that his wife started and is talking to me about as far as the "pus" that she is noticing is that were not really able to pack appropriately into the tunnel region. Right now he has an area that goes indirectly and then cuts upwards towards 12:00. In that 12:00 tunnel I think she is not really able to get into here because she tells me that whenever she changes the dressing it tends to be that the alginate that she put in is just sitting on the dressing outside and not no longer in the tunnel. Subsequently I think that a packing strip may be better at this point to work as a wick and actually come in contact with the base of the wound at least if she has any chance of packing again this is probably much better. In general however I think what is happening is fluid just collected in and behind this region and then subsequently starts coming out after getting to a certain point of collection. I believe that is what she is noticing there really was nothing dramatic on the culture  to indicate an active infection at this point to be honest. 8/23; again this is quite a bit better than when I saw this a month ago. Initially a deep tunneling pressure ulcer in the lower sacrum and coccyx. They wound basically got too narrow to use silver alginate we have now been using iodoform. There is a surface element to this as well. Fortunately we are down to 1 cm  of depth today. His wife is changing the dressing Electronic Signature(s) Signed: 05/05/2021 8:02:04 AM By: Stephen Najjar MD Entered By: [name] on 05/04/2021 12:13:54 -------------------------------------------------------------------------------- Physical Exam Details Patient Name: Date of Service: [name], [name] LV IN 05/04/2021 9:45 A M Medical Record Number: [id number] Patient Account Number: [id number] Date of Birth/Sex: Treating RN: 10-Aug-1961 (54 y.o. [name] Primary Care Provider: PCP, NO Other Clinician: Referring Provider: Treating Provider/Extender: [name] in Treatment: 7 Constitutional Sitting or standing Blood Pressure is within target range for patient.. Pulse regular and within target range for patient.Marland Kitchen Respirations regular, non-labored and within target range.. Temperature is normal and within the target range for the patient.Marland Kitchen Appears in no distress. Notes Wound exam; considerable improvement than when I saw this last time. There is 1 cm of tunneling depth. Small superficial area that looks healthy. No evidence of surrounding infection no drainage no purulence and no crepitus. Electronic Signature(s) Signed: 05/05/2021 8:02:04 AM By: Stephen Najjar MD Entered By: [name] on 05/04/2021 12:14:46 -------------------------------------------------------------------------------- Physician Orders Details Patient Name: Date of Service: [name], [name] LV IN 05/04/2021 9:45 A M Medical Record Number: [id number] Patient Account Number: [id number] Date of Birth/Sex: Treating RN: [birth year] (84 y.o. [name] Primary Care Provider: PCP, NO Other Clinician: Referring Provider: Treating Provider/Extender: [name] in Treatment: 7 Verbal / Phone Orders: No Diagnosis Coding Follow-up Appointments ppointment in 2 weeks. - Dr. Leanord Fletcher Return A Bathing/ Shower/ Hygiene May shower and wash wound with soap and water. - prior  to dressing change Off-Loading Gel wheelchair cushion Turn and reposition every 2 hours Wound Treatment Wound #1 - Gluteus Wound Laterality: Right Cleanser: Normal Saline (Generic) 1 x Per Day/15 Days Discharge Instructions: Cleanse the wound with Normal Saline prior to applying a clean dressing using gauze sponges, not tissue or cotton balls. Cleanser: Wound Cleanser (Generic) 1 x Per Day/15 Days Discharge Instructions: Cleanse the wound with wound cleanser prior to applying a clean dressing using gauze sponges, not tissue or cotton balls. Prim Dressing: KerraCel Ag Gelling Fiber Dressing, 4x5 in (silver alginate) 1 x Per Day/15 Days ary Discharge Instructions: Apply silver alginate to wound bed as instructed over wound bed on top of iodoform packing Prim Dressing: Iodoform packing strip 1/4 (in) (Generic) 1 x Per Day/15 Days ary Discharge Instructions: Lightly pack as instructed Secondary Dressing: Woven Gauze Sponge, Non-Sterile 4x4 in (Generic) 1 x Per Day/15 Days Discharge Instructions: Apply over primary dressing as directed. Secondary Dressing: ABD Pad, 5x9 (Generic) 1 x Per Day/15 Days Discharge Instructions: Apply over primary dressing as directed. Secured With: 16M Medipore H Soft Cloth Surgical T 4 x 2 (in/yd) (Generic) 1 x Per Day/15 Days ape Discharge Instructions: Secure dressing with tape as directed. Electronic Signature(s) Signed: 05/04/2021 5:31:05 PM By: Fonnie Mu RN Signed: 05/05/2021 8:02:04 AM By: Stephen Najjar MD Entered By: Fonnie Mu on 05/04/2021 10:55:44 -------------------------------------------------------------------------------- Problem List Details Patient Name: Date of Service: [name], [name] LV [place] 05/04/2021 9:45 A M Medical Record Number: [id number] Patient Account Number: [id number] Date of Birth/Sex: Treating RN: 08-02-1961 (60 y.o. M)  Fonnie Mu Primary Care Provider: PCP, NO Other Clinician: Referring Provider: Treating  Provider/Extender: [name] in Treatment: 7 Active Problems ICD-10 Encounter Code Description Active Date MDM Diagnosis L89.313 Pressure ulcer of right buttock, stage 3 03/10/2021 No Yes Inactive Problems Resolved Problems Electronic Signature(s) Signed: 05/05/2021 8:02:04 AM By: Stephen Najjar MD Entered By: [name] on 05/04/2021 12:13:00 -------------------------------------------------------------------------------- Progress Note Details Patient Name: Date of Service: [name], [name] LV IN 05/04/2021 9:45 A M Medical Record Number: [id number] Patient Account Number: [id number] Date of Birth/Sex: Treating RN: Aug 26, 1961 (71 y.o. [name] Primary Care Provider: PCP, NO Other Clinician: Referring Provider: Treating Provider/Extender: [name] in Treatment: 7 Subjective History of Present Illness (HPI) ADMISSION 03/10/2021 This is a 60 year old man who had became acutely ill largely as a result of urosepsis with delirium and had a prolonged hospitalization from 12/10/2020 through 02/02/2021. In brief he was septic with septic shock with E. coli bacteremia. He required intubation. Developed MSSA pneumonia and later in the course septic thrombophlebitis and coag negative staph sepsis. 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 During the course of his hospitalization he developed a right buttock pressure ulcer which was defined in the discharge problem list. He was subsequently sent to [facility] and was returned home 2 weeks ago. They do not have any insurance [Medicaid pending] in spite of this based on the picture on the patient's cell phone they have done an amazing job with getting this wound to look smaller. There is still a tunneling area. The patient is not incontinent eating and drinking well. His wife changing the dressing 3 times a day. Other past medical history includes type 2 diabetes, hypertension, coronary artery disease,  5 cm abdominal aortic aneurysm and he had a ureteral stent placed on 12/16/2020 in the early part of his hospitalization 7/6; this is a patient with a deep tunneling pressure ulcer in the lower sacrum/coccyx area. We have been using silver alginate. Minimal amount of drainage no evidence of infection. He now has Medicaid according to his wife but unfortunately they do not pay much in terms of wound care supplies and I talked to her about this today 7/27; this is a patient with a deep tunneling pressure ulcer in the lower sacrum and coccyx. We have been using silver alginate packing. Down to 5 cm of direct depth. However he has more clear yellow drainage although he is not complaining of pain. No systemic symptoms 04/20/2021 upon evaluation today patient appears to be doing better in regard to his wound in general. With that being said I do not see any signs of active infection at this time that is obvious. With that being said I think the biggest issue here that his wife started and is talking to me about as far as the "pus" that she is noticing is that were not really able to pack appropriately into the tunnel region. Right now he has an area that goes indirectly and then cuts upwards towards 12:00. In that 12:00 tunnel I think she is not really able to get into here because she tells me that whenever she changes the dressing it tends to be that the alginate that she put in is just sitting on the dressing outside and not no longer in the tunnel. Subsequently I think that a packing strip may be better at this point to work as a wick and actually come in contact with the base of the wound at least if she has  any chance of packing again this is probably much better. In general however I think what is happening is fluid just collected in and behind this region and then subsequently starts coming out after getting to a certain point of collection. I believe that is what she is noticing there really was nothing  dramatic on the culture to indicate an active infection at this point to be honest. 8/23; again this is quite a bit better than when I saw this a month ago. Initially a deep tunneling pressure ulcer in the lower sacrum and coccyx. They wound basically got too narrow to use silver alginate we have now been using iodoform. There is a surface element to this as well. Fortunately we are down to 1 cm of depth today. His wife is changing the dressing Objective Constitutional Sitting or standing Blood Pressure is within target range for patient.. Pulse regular and within target range for patient.Marland Kitchen. Respirations regular, non-labored and within target range.. Temperature is normal and within the target range for the patient.Marland Kitchen. Appears in no distress. Vitals Time Taken: 10:24 AM, Height: 73 in, Weight: 248 lbs, BMI: 32.7, Temperature: 98.4 F, Pulse: 98 bpm, Respiratory Rate: 18 breaths/min, Blood Pressure: 127/84 mmHg. General Notes: Wound exam; considerable improvement than when I saw this last time. There is 1 cm of tunneling depth. Small superficial area that looks healthy. No evidence of surrounding infection no drainage no purulence and no crepitus. Integumentary (Hair, Skin) Wound #1 status is Open. Original cause of wound was Pressure Injury. The date acquired was: 12/11/2020. The wound has been in treatment 7 weeks. The wound is located on the Right Gluteus. The wound measures 0.5cm length x 0.3cm width x 1cm depth; 0.118cm^2 area and 0.118cm^3 volume. There is Fat Layer (Subcutaneous Tissue) exposed. There is no tunneling or undermining noted. There is a medium amount of serosanguineous drainage noted. The wound margin is distinct with the outline attached to the wound base. There is large (67-100%) red, pink, friable granulation within the wound bed. There is no necrotic tissue within the wound bed. Assessment Active Problems ICD-10 Pressure ulcer of right buttock, stage 3 Plan Follow-up  Appointments: Return Appointment in 2 weeks. - Dr. Leanord Hawkingobson Bathing/ Shower/ Hygiene: May shower and wash wound with soap and water. - prior to dressing change Off-Loading: Gel wheelchair cushion Turn and reposition every 2 hours WOUND #1: - Gluteus Wound Laterality: Right Cleanser: Normal Saline (Generic) 1 x Per Day/15 Days Discharge Instructions: Cleanse the wound with Normal Saline prior to applying a clean dressing using gauze sponges, not tissue or cotton balls. Cleanser: Wound Cleanser (Generic) 1 x Per Day/15 Days Discharge Instructions: Cleanse the wound with wound cleanser prior to applying a clean dressing using gauze sponges, not tissue or cotton balls. Prim Dressing: KerraCel Ag Gelling Fiber Dressing, 4x5 in (silver alginate) 1 x Per Day/15 Days ary Discharge Instructions: Apply silver alginate to wound bed as instructed over wound bed on top of iodoform packing Prim Dressing: Iodoform packing strip 1/4 (in) (Generic) 1 x Per Day/15 Days ary Discharge Instructions: Lightly pack as instructed Secondary Dressing: Woven Gauze Sponge, Non-Sterile 4x4 in (Generic) 1 x Per Day/15 Days Discharge Instructions: Apply over primary dressing as directed. Secondary Dressing: ABD Pad, 5x9 (Generic) 1 x Per Day/15 Days Discharge Instructions: Apply over primary dressing as directed. Secured With: 7M Medipore H Soft Cloth Surgical T 4 x 2 (in/yd) (Generic) 1 x Per Day/15 Days ape Discharge Instructions: Secure dressing with tape as directed. 1. I  am continuing the iodoform packing to the small narrowing tunnel. Larynx silver alginate over the top of this the most superficial surface of this wound. 2. There is no evidence of infection 3. This should heal as long as the patient continues with aggressive offloading and we emphasized this Electronic Signature(s) Signed: 05/05/2021 8:02:04 AM By: Stephen Najjar MD Entered By: [name] on 05/04/2021  12:15:33 -------------------------------------------------------------------------------- SuperBill Details Patient Name: Date of Service: [name], [name] LV IN 05/04/2021 Medical Record Number: [id number] Patient Account Number: [id number] Date of Birth/Sex: Treating RN: 02-10-1961 (47 y.o. [name] Primary Care Provider: PCP, NO Other Clinician: Referring Provider: Treating Provider/Extender: [name] in Treatment: 7 Diagnosis Coding ICD-10 Codes Code Description L89.313 Pressure ulcer of right buttock, stage 3 Facility Procedures CPT4 Code: [id number] Description: 99213 - WOUND CARE VISIT-LEV 3 EST PT Modifier: Quantity: 1 Physician Procedures : CPT4 Code Description Modifier [id number] 99213 - WC PHYS LEVEL 3 - EST PT ICD-10 Diagnosis Description L89.313 Pressure ulcer of right buttock, stage 3 Quantity: 1 Electronic Signature(s) Signed: 05/05/2021 8:02:04 AM By: Stephen Najjar MD Entered By: [name] on 05/04/2021 12:15:46

## 2021-05-05 NOTE — Progress Notes (Signed)
[name], [name] ([id number]) Visit Report for 05/04/2021 Arrival Information Details Patient Name: Date of Service: [name], [name] [place] 05/04/2021 9:45 A M Medical Record Number: [id number] Patient Account Number: [id number] Date of Birth/Sex: Treating RN: [birth year] (60 y.o. Marcheta Grammes Primary Care Prescious Hurless: PCP, NO Other Clinician: Referring Nikola Marone: Treating Meigan Pates/Extender: Cheree Ditto in Treatment: 7 Visit Information History Since Last Visit Added or deleted any medications: No Patient Arrived: Cane Any new allergies or adverse reactions: No Arrival Time: 10:20 Had a fall or experienced change in No Accompanied By: wife activities of daily living that may affect Transfer Assistance: None risk of falls: Patient Identification Verified: Yes Signs or symptoms of abuse/neglect since last visito No Secondary Verification Process Completed: Yes Hospitalized since last visit: No Patient Requires Transmission-Based Precautions: No Implantable device outside of the clinic excluding No Patient Has Alerts: No cellular tissue based products placed in the center since last visit: Has Dressing in Place as Prescribed: Yes Pain Present Now: No Electronic Signature(s) Signed: 05/04/2021 5:51:35 PM By: Lorrin Jackson Entered By: Lorrin Jackson on 05/04/2021 10:24:07 -------------------------------------------------------------------------------- Clinic Level of Care Assessment Details Patient Name: Date of Service: NOE, GOYER LV [place] 05/04/2021 9:45 A M Medical Record Number: [id number] Patient Account Number: [id number] Date of Birth/Sex: Treating RN: 08-11-1961 (60 y.o. Burnadette Pop, Lauren Primary Care Vaughn Frieze: PCP, NO Other Clinician: Referring Tyrome Donatelli: Treating Jshaun Abernathy/Extender: Cheree Ditto in Treatment: 7 Clinic Level of Care Assessment Items TOOL 4 Quantity Score X- 1 0 Use when only an EandM is performed on FOLLOW-UP visit ASSESSMENTS - Nursing  Assessment / Reassessment X- 1 10 Reassessment of Co-morbidities (includes updates in patient status) X- 1 5 Reassessment of Adherence to Treatment Plan ASSESSMENTS - Wound and Skin A ssessment / Reassessment X - Simple Wound Assessment / Reassessment - one wound 1 5 '[]'  - 0 Complex Wound Assessment / Reassessment - multiple wounds '[]'  - 0 Dermatologic / Skin Assessment (not related to wound area) ASSESSMENTS - Focused Assessment '[]'  - 0 Circumferential Edema Measurements - multi extremities '[]'  - 0 Nutritional Assessment / Counseling / Intervention '[]'  - 0 Lower Extremity Assessment (monofilament, tuning fork, pulses) '[]'  - 0 Peripheral Arterial Disease Assessment (using hand held doppler) ASSESSMENTS - Ostomy and/or Continence Assessment and Care '[]'  - 0 Incontinence Assessment and Management '[]'  - 0 Ostomy Care Assessment and Management (repouching, etc.) PROCESS - Coordination of Care X - Simple Patient / Family Education for ongoing care 1 15 '[]'  - 0 Complex (extensive) Patient / Family Education for ongoing care X- 1 10 Staff obtains Programmer, systems, Records, T Results / Process Orders est '[]'  - 0 Staff telephones HHA, Nursing Homes / Clarify orders / etc '[]'  - 0 Routine Transfer to another Facility (non-emergent condition) '[]'  - 0 Routine Hospital Admission (non-emergent condition) '[]'  - 0 New Admissions / Biomedical engineer / Ordering NPWT Apligraf, etc. , '[]'  - 0 Emergency Hospital Admission (emergent condition) X- 1 10 Simple Discharge Coordination '[]'  - 0 Complex (extensive) Discharge Coordination PROCESS - Special Needs '[]'  - 0 Pediatric / Minor Patient Management '[]'  - 0 Isolation Patient Management '[]'  - 0 Hearing / Language / Visual special needs '[]'  - 0 Assessment of Community assistance (transportation, D/C planning, etc.) '[]'  - 0 Additional assistance / Altered mentation '[]'  - 0 Support Surface(s) Assessment (bed, cushion, seat, etc.) INTERVENTIONS - Wound  Cleansing / Measurement X - Simple Wound Cleansing - one wound 1 5 '[]'  - 0 Complex Wound Cleansing - multiple wounds X- 1 5 Wound Imaging (  photographs - any number of wounds) '[]'  - 0 Wound Tracing (instead of photographs) X- 1 5 Simple Wound Measurement - one wound '[]'  - 0 Complex Wound Measurement - multiple wounds INTERVENTIONS - Wound Dressings X - Small Wound Dressing one or multiple wounds 1 10 '[]'  - 0 Medium Wound Dressing one or multiple wounds '[]'  - 0 Large Wound Dressing one or multiple wounds '[]'  - 0 Application of Medications - topical '[]'  - 0 Application of Medications - injection INTERVENTIONS - Miscellaneous '[]'  - 0 External ear exam '[]'  - 0 Specimen Collection (cultures, biopsies, blood, body fluids, etc.) '[]'  - 0 Specimen(s) / Culture(s) sent or taken to Lab for analysis '[]'  - 0 Patient Transfer (multiple staff / Civil Service fast streamer / Similar devices) '[]'  - 0 Simple Staple / Suture removal (25 or less) '[]'  - 0 Complex Staple / Suture removal (26 or more) '[]'  - 0 Hypo / Hyperglycemic Management (close monitor of Blood Glucose) '[]'  - 0 Ankle / Brachial Index (ABI) - do not check if billed separately X- 1 5 Vital Signs Has the patient been seen at the hospital within the last three years: Yes Total Score: 85 Level Of Care: New/Established - Level 3 Electronic Signature(s) Signed: 05/04/2021 5:31:05 PM By: Rhae Hammock RN Entered By: Rhae Hammock on 05/04/2021 11:00:19 -------------------------------------------------------------------------------- Encounter Discharge Information Details Patient Name: Date of Service: [name], [name] LV [place] 05/04/2021 9:45 A M Medical Record Number: [id number] Patient Account Number: [id number] Date of Birth/Sex: Treating RN: 1961-03-23 (60 y.o. Marcheta Grammes Primary Care Analiza Cowger: PCP, NO Other Clinician: Referring Talik Casique: Treating Rosea Dory/Extender: Cheree Ditto in Treatment: 7 Encounter Discharge Information  Items Discharge Condition: Stable Ambulatory Status: Cane Discharge Destination: Home Transportation: Private Auto Accompanied By: wife Schedule Follow-up Appointment: Yes Clinical Summary of Care: Provided on 05/04/2021 Form Type Recipient Paper Patient Patient Electronic Signature(s) Signed: 05/04/2021 5:51:35 PM By: Lorrin Jackson Entered By: Lorrin Jackson on 05/04/2021 11:09:55 -------------------------------------------------------------------------------- Lower Extremity Assessment Details Patient Name: Date of Service: [name], [name] LV [place] 05/04/2021 9:45 A M Medical Record Number: [id number] Patient Account Number: [id number] Date of Birth/Sex: Treating RN: [birth year] (60 y.o. Marcheta Grammes Primary Care Kyrstyn Greear: PCP, NO Other Clinician: Referring Raysha Tilmon: Treating Simon Aaberg/Extender: Cheree Ditto in Treatment: 7 Electronic Signature(s) Signed: 05/04/2021 5:51:35 PM By: Lorrin Jackson Entered By: Lorrin Jackson on 05/04/2021 10:24:46 -------------------------------------------------------------------------------- Multi Wound Chart Details Patient Name: Date of Service: [name], [name] LV [place] 05/04/2021 9:45 A M Medical Record Number: [id number] Patient Account Number: [id number] Date of Birth/Sex: Treating RN: 1960/09/13 (60 y.o. Erie Noe Primary Care Aroldo Galli: PCP, NO Other Clinician: Referring Ceri Mayer: Treating Karrissa Parchment/Extender: Cheree Ditto in Treatment: 7 Vital Signs Height(in): 73 Pulse(bpm): 98 Weight(lbs): 248 Blood Pressure(mmHg): 127/84 Body Mass Index(BMI): 33 Temperature(F): 98.4 Respiratory Rate(breaths/min): 18 Photos: [N/A:N/A] Right Gluteus N/A N/A Wound Location: Pressure Injury N/A N/A Wounding Event: Pressure Ulcer N/A N/A Primary Etiology: Coronary Artery Disease, N/A N/A Comorbid History: Hypertension, Type II Diabetes, Osteoarthritis 12/11/2020 N/A N/A Date Acquired: 7 N/A N/A Weeks of Treatment: Open N/A  N/A Wound Status: 0.5x0.3x1 N/A N/A Measurements L x W x D (cm) 0.118 N/A N/A A (cm) : rea 0.118 N/A N/A Volume (cm) : 97.10% N/A N/A % Reduction in A rea: 99.50% N/A N/A % Reduction in Volume: Category/Stage III N/A N/A Classification: Medium N/A N/A Exudate A mount: Serosanguineous N/A N/A Exudate Type: red, brown N/A N/A Exudate Color: Distinct, outline attached N/A N/A Wound Margin: Large (67-100%) N/A N/A Granulation A mount: Red, Pink,  Friable N/A N/A Granulation Quality: None Present (0%) N/A N/A Necrotic A mount: Fat Layer (Subcutaneous Tissue): Yes N/A N/A Exposed Structures: Fascia: No Tendon: No Muscle: No Joint: No Bone: No Medium (34-66%) N/A N/A Epithelialization: Treatment Notes Wound #1 (Gluteus) Wound Laterality: Right Cleanser Normal Saline Discharge Instruction: Cleanse the wound with Normal Saline prior to applying a clean dressing using gauze sponges, not tissue or cotton balls. Wound Cleanser Discharge Instruction: Cleanse the wound with wound cleanser prior to applying a clean dressing using gauze sponges, not tissue or cotton balls. Peri-Wound Care Topical Primary Dressing KerraCel Ag Gelling Fiber Dressing, 4x5 in (silver alginate) Discharge Instruction: Apply silver alginate to wound bed as instructed over wound bed on top of iodoform packing Iodoform packing strip 1/4 (in) Discharge Instruction: Lightly pack as instructed Secondary Dressing Woven Gauze Sponge, Non-Sterile 4x4 in Discharge Instruction: Apply over primary dressing as directed. ABD Pad, 5x9 Discharge Instruction: Apply over primary dressing as directed. Secured With 16M Medipore H Soft Cloth Surgical T 4 x 2 (in/yd) ape Discharge Instruction: Secure dressing with tape as directed. Compression Wrap Compression Stockings Add-Ons Electronic Signature(s) Signed: 05/04/2021 5:31:05 PM By: Rhae Hammock RN Signed: 05/05/2021 8:02:04 AM By: Linton Ham  MD Entered By: Linton Ham on 05/04/2021 12:13:09 -------------------------------------------------------------------------------- Multi-Disciplinary Care Plan Details Patient Name: Date of Service: [name], [name] LV [place] 05/04/2021 9:45 A M Medical Record Number: [id number] Patient Account Number: [id number] Date of Birth/Sex: Treating RN: November 13, 1960 (60 y.o. Burnadette Pop, Lauren Primary Care Pualani Borah: PCP, NO Other Clinician: Referring Iana Buzan: Treating Harless Molinari/Extender: Cheree Ditto in Treatment: 7 Multidisciplinary Care Plan reviewed with physician Active Inactive Nutrition Nursing Diagnoses: Impaired glucose control: actual or potential Potential for alteratiion in Nutrition/Potential for imbalanced nutrition Goals: Patient/caregiver agrees to and verbalizes understanding of need to use nutritional supplements and/or vitamins as prescribed Date Initiated: 03/10/2021 Date Inactivated: 04/07/2021 Target Resolution Date: 04/09/2021 Goal Status: Met Patient/caregiver will maintain therapeutic glucose control Date Initiated: 03/10/2021 Target Resolution Date: 05/11/2021 Goal Status: Active Interventions: Assess HgA1c results as ordered upon admission and as needed Assess patient nutrition upon admission and as needed per policy Provide education on elevated blood sugars and impact on wound healing Provide education on nutrition Treatment Activities: Education provided on Nutrition : 03/10/2021 Notes: 04/07/21: Glucose control ongoing Pressure Nursing Diagnoses: Knowledge deficit related to causes and risk factors for pressure ulcer development Knowledge deficit related to management of pressures ulcers Potential for impaired tissue integrity related to pressure, friction, moisture, and shear Goals: Patient will remain free from development of additional pressure ulcers Date Initiated: 03/10/2021 Target Resolution Date: 05/11/2021 Goal Status: Active Patient/caregiver  will verbalize risk factors for pressure ulcer development Date Initiated: 03/10/2021 Date Inactivated: 04/07/2021 Target Resolution Date: 04/09/2021 Goal Status: Met Patient/caregiver will verbalize understanding of pressure ulcer management Date Initiated: 03/10/2021 Date Inactivated: 04/07/2021 Target Resolution Date: 04/09/2021 Goal Status: Met Interventions: Assess: immobility, friction, shearing, incontinence upon admission and as needed Assess offloading mechanisms upon admission and as needed Assess potential for pressure ulcer upon admission and as needed Provide education on pressure ulcers Notes: Wound/Skin Impairment Nursing Diagnoses: Impaired tissue integrity Knowledge deficit related to ulceration/compromised skin integrity Goals: Patient/caregiver will verbalize understanding of skin care regimen Date Initiated: 03/10/2021 Date Inactivated: 04/07/2021 Target Resolution Date: 04/09/2021 Goal Status: Met Ulcer/skin breakdown will have a volume reduction of 30% by week 4 Date Initiated: 03/10/2021 Date Inactivated: 04/07/2021 Target Resolution Date: 04/09/2021 Goal Status: Met Ulcer/skin breakdown will have a volume reduction of 50% by week 8 Date  Initiated: 04/07/2021 Target Resolution Date: 05/11/2021 Goal Status: Active Interventions: Assess patient/caregiver ability to obtain necessary supplies Assess patient/caregiver ability to perform ulcer/skin care regimen upon admission and as needed Assess ulceration(s) every visit Provide education on ulcer and skin care Notes: Electronic Signature(s) Signed: 05/04/2021 5:31:05 PM By: Rhae Hammock RN Entered By: Rhae Hammock on 05/04/2021 10:55:59 -------------------------------------------------------------------------------- Pain Assessment Details Patient Name: Date of Service: [name], [name] LV [place] 05/04/2021 9:45 A M Medical Record Number: [id number] Patient Account Number: [id number] Date of Birth/Sex: Treating  RN: 02-11-1961 (60 y.o. Marcheta Grammes Primary Care Mylinh Cragg: PCP, NO Other Clinician: Referring Keina Mutch: Treating Garnett Rekowski/Extender: Cheree Ditto in Treatment: 7 Active Problems Location of Pain Severity and Description of Pain Patient Has Paino No Site Locations Pain Management and Medication Current Pain Management: Electronic Signature(s) Signed: 05/04/2021 5:51:35 PM By: Lorrin Jackson Entered By: Lorrin Jackson on 05/04/2021 10:24:39 -------------------------------------------------------------------------------- Patient/Caregiver Education Details Patient Name: Date of Service: [name] LV [place] 8/23/2022andnbsp9:45 A M Medical Record Number: [id number] Patient Account Number: [id number] Date of Birth/Gender: Treating RN: 12/12/1960 (60 y.o. Erie Noe Primary Care Physician: PCP, NO Other Clinician: Referring Physician: Treating Physician/Extender: Cheree Ditto in Treatment: 7 Education Assessment Education Provided To: Patient Education Topics Provided Wound/Skin Impairment: Methods: Explain/Verbal Responses: State content correctly Electronic Signature(s) Signed: 05/04/2021 5:31:05 PM By: Rhae Hammock RN Entered By: Rhae Hammock on 05/04/2021 10:56:10 -------------------------------------------------------------------------------- Wound Assessment Details Patient Name: Date of Service: [name], [name] LV [place] 05/04/2021 9:45 A M Medical Record Number: [id number] Patient Account Number: [id number] Date of Birth/Sex: Treating RN: 08/09/1961 (60 y.o. Marcheta Grammes Primary Care Gray Doering: PCP, NO Other Clinician: Referring Elliana Bal: Treating Coltan Spinello/Extender: Cheree Ditto in Treatment: 7 Wound Status Wound Number: 1 Primary Pressure Ulcer Etiology: Wound Location: Right Gluteus Wound Status: Open Wounding Event: Pressure Injury Comorbid Coronary Artery Disease, Hypertension, Type II Diabetes, Date Acquired:  12/11/2020 History: Osteoarthritis Weeks Of Treatment: 7 Clustered Wound: No Photos Wound Measurements Length: (cm) 0.5 Width: (cm) 0.3 Depth: (cm) 1 Area: (cm) 0.118 Volume: (cm) 0.118 % Reduction in Area: 97.1% % Reduction in Volume: 99.5% Epithelialization: Medium (34-66%) Tunneling: No Undermining: No Wound Description Classification: Category/Stage III Wound Margin: Distinct, outline attached Exudate Amount: Medium Exudate Type: Serosanguineous Exudate Color: red, brown Foul Odor After Cleansing: No Slough/Fibrino No Wound Bed Granulation Amount: Large (67-100%) Exposed Structure Granulation Quality: Red, Pink, Friable Fascia Exposed: No Necrotic Amount: None Present (0%) Fat Layer (Subcutaneous Tissue) Exposed: Yes Tendon Exposed: No Muscle Exposed: No Joint Exposed: No Bone Exposed: No Treatment Notes Wound #1 (Gluteus) Wound Laterality: Right Cleanser Normal Saline Discharge Instruction: Cleanse the wound with Normal Saline prior to applying a clean dressing using gauze sponges, not tissue or cotton balls. Wound Cleanser Discharge Instruction: Cleanse the wound with wound cleanser prior to applying a clean dressing using gauze sponges, not tissue or cotton balls. Peri-Wound Care Topical Primary Dressing KerraCel Ag Gelling Fiber Dressing, 4x5 in (silver alginate) Discharge Instruction: Apply silver alginate to wound bed as instructed over wound bed on top of iodoform packing Iodoform packing strip 1/4 (in) Discharge Instruction: Lightly pack as instructed Secondary Dressing Woven Gauze Sponge, Non-Sterile 4x4 in Discharge Instruction: Apply over primary dressing as directed. ABD Pad, 5x9 Discharge Instruction: Apply over primary dressing as directed. Secured With 82M Medipore H Soft Cloth Surgical T 4 x 2 (in/yd) ape Discharge Instruction: Secure dressing with tape as directed. Compression Wrap Compression Stockings Add-Ons Electronic  Signature(s) Signed: 05/04/2021 5:51:35 PM By: Lorrin Jackson Entered By: Onnie Boer  Jodi on 05/04/2021 10:28:58 -------------------------------------------------------------------------------- Vitals Details Patient Name: Date of Service: [name], [name] LV [place] 05/04/2021 9:45 A M Medical Record Number: [id number] Patient Account Number: [id number] Date of Birth/Sex: Treating RN: 04/27/1961 (60 y.o. Marcheta Grammes Primary Care Jarelyn Bambach: PCP, NO Other Clinician: Referring Tamma Brigandi: Treating Zaria Taha/Extender: Cheree Ditto in Treatment: 7 Vital Signs Time Taken: 10:24 Temperature (F): 98.4 Height (in): 73 Pulse (bpm): 98 Weight (lbs): 248 Respiratory Rate (breaths/min): 18 Body Mass Index (BMI): 32.7 Blood Pressure (mmHg): 127/84 Reference Range: 80 - 120 mg / dl Electronic Signature(s) Signed: 05/04/2021 5:51:35 PM By: Lorrin Jackson Entered By: Lorrin Jackson on 05/04/2021 10:24:30

## 2021-05-18 ENCOUNTER — Other Ambulatory Visit: Payer: Self-pay

## 2021-05-18 ENCOUNTER — Encounter (HOSPITAL_BASED_OUTPATIENT_CLINIC_OR_DEPARTMENT_OTHER): Payer: MEDICAID | Attending: Internal Medicine | Admitting: Internal Medicine

## 2021-05-18 NOTE — Progress Notes (Signed)
[name], [name] ([id number]) Visit Report for 05/18/2021 HPI Details Patient Name: Date of Service: [name], [name] LV IN 05/18/2021 10:30 [name] M Medical Record Number: [id number] Patient Account Number: [id number] Date of Birth/Sex: Treating RN: 1961/01/12 (60 y.o. [name] Primary Care Provider: PCP, NO Other Clinician: Referring Provider: Treating Provider/Extender: Valentino Saxon in Treatment: 9 History of Present Illness HPI Description: ADMISSION 03/10/2021 This is [name] 8 year old man who had became acutely ill largely as [name] result of urosepsis with delirium and had [name] prolonged hospitalization from 12/10/2020 through 02/02/2021. In brief he was septic with septic shock with E. coli bacteremia. He required intubation. Developed MSSA pneumonia and later in the course septic thrombophlebitis and coag negative staph sepsis. 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 During the course of his hospitalization he developed [name] right buttock pressure ulcer which was defined in the discharge problem list. He was subsequently sent to [facility] and was returned home 2 weeks ago. They do not have any insurance [Medicaid pending] in spite of this based on the picture on the patient's cell phone they have done an amazing job with getting this wound to look smaller. There is still [name] tunneling area. The patient is not incontinent eating and drinking well. His wife changing the dressing 3 times [name] day. 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 We have been using silver alginate. Minimal amount of drainage no evidence of infection. He now has Medicaid according to his wife but unfortunately they do not pay much in terms of wound care  supplies and I talked to her about this today 7/27; this is [name] patient with [name] deep tunneling pressure ulcer in the lower sacrum and coccyx. We have been using silver alginate packing. Down to 5 cm of direct depth. However he has more clear yellow drainage although he is not complaining of pain. No systemic symptoms 04/20/2021 upon evaluation today patient appears to be doing better in regard to his wound in general. With that being said I do not see any signs of active infection at this time that is obvious. With that being said I think the biggest issue here that his wife started and is talking to me about as far as the "pus" that she is noticing is that were not really able to pack appropriately into the tunnel region. Right now he has an area that goes indirectly and then cuts upwards towards 12:00. In that 12:00 tunnel I think she is not really able to get into here because she tells me that whenever she changes the dressing it tends to be that the alginate that she put in is just sitting on the dressing outside and not no longer in the tunnel. Subsequently I think that [name] packing strip may be better at this point to work as [name] wick and actually come in contact with the base of the wound at least if she has any chance of packing again this is probably much better. In general however I think what is happening is fluid just collected in and behind this region and then subsequently starts coming out after getting to [name] certain point of collection. I believe that is what she is noticing there really was nothing dramatic on the culture  to indicate an active infection at this point to be honest. 8/23; again this is quite [name] bit better than when I saw this [name] month ago. Initially [name] deep tunneling pressure ulcer in the lower sacrum and coccyx. They wound basically got too narrow to use silver alginate we have now been using iodoform. There is [name] surface element to this as well. Fortunately we are down to 1 cm  of depth today. His wife is changing the dressing 9/6; 2-week follow-up. This is down to [name] pinpoint 0.6 cm punched out area. The superficial part of this was closed over. They have not even been able to put iodoform in this area for about the past week according to his wife. We are going to try Iodosorb when Electronic Signature(s) Signed: 05/18/2021 5:13:37 PM By: Baltazar Najjar MD Entered By: Baltazar Najjar on 05/18/2021 11:11:09 -------------------------------------------------------------------------------- Physical Exam Details Patient Name: Date of Service: [name], [name] LV IN 05/18/2021 10:30 [name] M Medical Record Number: [id number] Patient Account Number: [id number] Date of Birth/Sex: Treating RN: Aug 30, 1961 (60 y.o. [name] Primary Care Provider: PCP, NO Other Clinician: Referring Provider: Treating Provider/Extender: Valentino Saxon in Treatment: 9 Constitutional Sitting or standing Blood Pressure is within target range for patient.. Pulse regular and within target range for patient.Marland Kitchen Respirations regular, non-labored and within target range.. Temperature is normal and within the target range for the patient.Marland Kitchen Appears in no distress. Notes Wound exam; considerable improvement. [name] small probing hole with 0.6 cm in depth. There is no purulence no surrounding soft tissue tenderness or erythema. Electronic Signature(s) Signed: 05/18/2021 5:13:37 PM By: Baltazar Najjar MD Entered By: Baltazar Najjar on 05/18/2021 11:13:12 -------------------------------------------------------------------------------- Physician Orders Details Patient Name: Date of Service: [name], [name] LV IN 05/18/2021 10:30 [name] M Medical Record Number: [id number] Patient Account Number: [id number] Date of Birth/Sex: Treating RN: 11/01/1960 (60 y.o. [name] Primary Care Provider: PCP, NO Other Clinician: Referring Provider: Treating Provider/Extender: Valentino Saxon in Treatment: 9 Verbal /  Phone Orders: No Diagnosis Coding Follow-up Appointments ppointment in 2 weeks. - Dr. Leanord Hawking Return [name] Bathing/ Shower/ Hygiene May shower and wash wound with soap and water. - prior to dressing change Off-Loading Gel wheelchair cushion Turn and reposition every 2 hours Wound Treatment Wound #1 - Gluteus Wound Laterality: Right Cleanser: Normal Saline (Generic) 1 x Per Day/15 Days Discharge Instructions: Cleanse the wound with Normal Saline prior to applying [name] clean dressing using gauze sponges, not tissue or cotton balls. Cleanser: Wound Cleanser (Generic) 1 x Per Day/15 Days Discharge Instructions: Cleanse the wound with wound cleanser prior to applying [name] clean dressing using gauze sponges, not tissue or cotton balls. Prim Dressing: Iodosorb Gel 10 (gm) Tube 1 x Per Day/15 Days ary Discharge Instructions: Apply to wound bed as instructed Secondary Dressing: Zetuvit Plus Silicone Border Dressing 4x4 (in/in) 1 x Per Day/15 Days Discharge Instructions: Apply silicone border over primary dressing as directed. Secured With: 10M Medipore H Soft Cloth Surgical T 4 x 2 (in/yd) (Generic) 1 x Per Day/15 Days ape Discharge Instructions: Secure dressing with tape as directed. Electronic Signature(s) Signed: 05/18/2021 5:13:37 PM By: Baltazar Najjar MD Signed: 05/18/2021 5:22:11 PM By: Fonnie Mu RN Entered By: Fonnie Mu on 05/18/2021 11:13:30 -------------------------------------------------------------------------------- Problem List Details Patient Name: Date of Service: [name], [name] LV IN 05/18/2021 10:30 [name] M Medical Record Number: [id number] Patient Account Number: [id number] Date of Birth/Sex: Treating RN: Aug 06, 1961 (60 y.o. [name] Primary Care Provider: PCP, NO Other Clinician: Referring Provider: Treating Provider/Extender:  Valentino Saxonobson, Sharita Bienaime Weeks in Treatment: 9 Active Problems ICD-10 Encounter Code Description Active Date MDM Diagnosis L89.313 Pressure ulcer  of right buttock, stage 3 03/10/2021 No Yes Inactive Problems Resolved Problems Electronic Signature(s) Signed: 05/18/2021 5:13:37 PM By: Baltazar Najjarobson, Zunaira Lamy MD Entered By: Baltazar Najjarobson, Resean Brander on 05/18/2021 11:09:55 -------------------------------------------------------------------------------- Progress Note Details Patient Name: Date of Service: [name], [name] LV IN 05/18/2021 10:30 [name] M Medical Record Number: [id number] Patient Account Number: [id number] Date of Birth/Sex: Treating RN: 09/30/1960 (60 y.o. Lucious GrovesM) Breedlove, Lauren Primary Care Provider: PCP, NO Other Clinician: Referring Provider: Treating Provider/Extender: Valentino Saxonobson, Illianna Paschal Weeks in Treatment: 9 Subjective History of Present Illness (HPI) ADMISSION 03/10/2021 This is [name] 60 year old man who had became acutely ill largely as [name] result of urosepsis with delirium and had [name] prolonged hospitalization from 12/10/2020 through 02/02/2021. In brief he was septic with septic shock with E. coli bacteremia. He required intubation. Developed MSSA pneumonia and later in the course septic thrombophlebitis and coag negative staph sepsis. 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 During the course of his hospitalization he developed [name] right buttock pressure ulcer which was defined in the discharge problem list. [facility] and was returned home 2 weeks ago. They do not have any insurance [Medicaid pending] in spite of this based on the picture on the patient's cell phone they have done an amazing job with getting this wound to look smaller. There is still [name] tunneling area. The patient is not incontinent eating and drinking well. His wife changing the dressing 3 times [name] day. Other past medical history includes type 2 diabetes, hypertension, coronary artery disease, 5 cm abdominal aortic aneurysm and he had [name] ureteral stent placed on 12/16/2020 in the early part of his hospitalization 7/6; this is [name] patient with [name] deep  tunneling pressure ulcer in the lower sacrum/coccyx area. We have been using silver alginate. Minimal amount of drainage no evidence of infection. He now has Medicaid according to his wife but unfortunately they do not pay much in terms of wound care supplies and I talked to her about this today 7/27; this is [name] patient with [name] deep tunneling pressure ulcer in the lower sacrum and coccyx. We have been using silver alginate packing. Down to 5 cm of direct depth. However he has more clear yellow drainage although he is not complaining of pain. No systemic symptoms 04/20/2021 upon evaluation today patient appears to be doing better in regard to his wound in general. With that being said I do not see any signs of active infection at this time that is obvious. With that being said I think the biggest issue here that his wife started and is talking to me about as far as the "pus" that she is noticing is that were not really able to pack appropriately into the tunnel region. Right now he has an area that goes indirectly and then cuts upwards towards 12:00. In that 12:00 tunnel I think she is not really able to get into here because she tells me that whenever she changes the dressing it tends to be that the alginate that she put in is just sitting on the dressing outside and not no longer in the tunnel. Subsequently I think that [name] packing strip may be better at this point to work as [name] wick and actually come in contact with the base of the wound at least if she has any chance of packing again this is probably much better. In general however  I think what is happening is fluid just collected in and behind this region and then subsequently starts coming out after getting to [name] certain point of collection. I believe that is what she is noticing there really was nothing dramatic on the culture to indicate an active infection at this point to be honest. 8/23; again this is quite [name] bit better than when I saw this [name] month  ago. Initially [name] deep tunneling pressure ulcer in the lower sacrum and coccyx. They wound basically got too narrow to use silver alginate we have now been using iodoform. There is [name] surface element to this as well. Fortunately we are down to 1 cm of depth today. His wife is changing the dressing 9/6; 2-week follow-up. This is down to [name] pinpoint 0.6 cm punched out area. The superficial part of this was closed over. They have not even been able to put iodoform in this area for about the past week according to his wife. We are going to try Iodosorb when Objective Constitutional Sitting or standing Blood Pressure is within target range for patient.. Pulse regular and within target range for patient.Marland Kitchen Respirations regular, non-labored and within target range.. Temperature is normal and within the target range for the patient.Marland Kitchen Appears in no distress. Vitals Time Taken: 10:42 AM, Height: 73 in, Weight: 248 lbs, BMI: 32.7, Temperature: 98.2 F, Pulse: 84 bpm, Respiratory Rate: 18 breaths/min, Blood Pressure: 147/87 mmHg. General Notes: Wound exam; considerable improvement. [name] small probing hole with 0.6 cm in depth. There is no purulence no surrounding soft tissue tenderness or erythema. Integumentary (Hair, Skin) Wound #1 status is Open. Original cause of wound was Pressure Injury. The date acquired was: 12/11/2020. The wound has been in treatment 9 weeks. The wound is located on the Right Gluteus. The wound measures 0.1cm length x 0.1cm width x 0.6cm depth; 0.008cm^2 area and 0.005cm^3 volume. There is Fat Layer (Subcutaneous Tissue) exposed. There is [name] medium amount of serosanguineous drainage noted. The wound margin is distinct with the outline attached to the wound base. There is large (67-100%) red, pink, friable granulation within the wound bed. There is no necrotic tissue within the wound bed. Assessment Active Problems ICD-10 Pressure ulcer of right buttock, stage 3 Plan Follow-up  Appointments: Return Appointment in 2 weeks. - Dr. Leanord Hawking Bathing/ Shower/ Hygiene: May shower and wash wound with soap and water. - prior to dressing change Off-Loading: Gel wheelchair cushion Turn and reposition every 2 hours WOUND #1: - Gluteus Wound Laterality: Right Cleanser: Normal Saline (Generic) 1 x Per Day/15 Days Discharge Instructions: Cleanse the wound with Normal Saline prior to applying [name] clean dressing using gauze sponges, not tissue or cotton balls. Cleanser: Wound Cleanser (Generic) 1 x Per Day/15 Days Discharge Instructions: Cleanse the wound with wound cleanser prior to applying [name] clean dressing using gauze sponges, not tissue or cotton balls. Prim Dressing: Iodosorb Gel 10 (gm) Tube 1 x Per Day/15 Days ary Discharge Instructions: Apply to wound bed as instructed Secondary Dressing: Zetuvit Plus Silicone Border Dressing 4x4 (in/in) 1 x Per Day/15 Days Discharge Instructions: Apply silicone border over primary dressing as directed. Secured With: 107M Medipore H Soft Cloth Surgical T 4 x 2 (in/yd) (Generic) 1 x Per Day/15 Days ape Discharge Instructions: Secure dressing with tape as directed. 1. This wound continues to make improvement. I have suggested Iodosorb ointment lightly teased into the open area that remains every second day. 2. They have done an excellent job of offloading this wound otherwise  with no evidence of surrounding infection. I am hopeful that this will be closed in 2 weeks Electronic Signature(s) Signed: 05/18/2021 5:13:37 PM By: Baltazar Najjar MD Entered By: Baltazar Najjar on 05/18/2021 11:14:39 -------------------------------------------------------------------------------- SuperBill Details Patient Name: Date of Service: [name], [name] LV IN 05/18/2021 Medical Record Number: [id number] Patient Account Number: [id number] Date of Birth/Sex: Treating RN: 05/01/1961 (60 y.o. [name] Primary Care Provider: PCP, NO Other Clinician: Referring  Provider: Treating Provider/Extender: Valentino Saxon in Treatment: 9 Diagnosis Coding ICD-10 Codes Code Description L89.313 Pressure ulcer of right buttock, stage 3 Physician Procedures : CPT4 Code Description Modifier [id number] - WC PHYS LEVEL 3 - EST PT ICD-10 Diagnosis Description L89.313 Pressure ulcer of right buttock, stage 3 Quantity: 1 Electronic Signature(s) Signed: 05/18/2021 5:13:37 PM By: Baltazar Najjar MD Entered By: Baltazar Najjar on 05/18/2021 11:14:54

## 2021-05-20 NOTE — Progress Notes (Signed)
[name], [name] ([id number]) Visit Report for 05/18/2021 Arrival Information Details Patient Name: Date of Service: [name], [name] LV [place] 05/18/2021 10:30 A M Medical Record Number: [id number] Patient Account Number: [id number] Date of Birth/Sex: Treating RN: 1961-05-09 (60 y.o. Burnadette Pop, Lauren Primary Care Nichola Warren: PCP, NO Other Clinician: Referring Elianis Fischbach: Treating Kasyn Stouffer/Extender: Cheree Ditto in Treatment: 9 Visit Information History Since Last Visit Added or deleted any medications: No Patient Arrived: Ambulatory Any new allergies or adverse reactions: No Arrival Time: 10:39 Had a fall or experienced change in No Accompanied By: wife activities of daily living that may affect Transfer Assistance: None risk of falls: Patient Identification Verified: Yes Signs or symptoms of abuse/neglect since last visito No Secondary Verification Process Completed: Yes Hospitalized since last visit: No Patient Requires Transmission-Based Precautions: No Implantable device outside of the clinic excluding No Patient Has Alerts: No cellular tissue based products placed in the center since last visit: Has Dressing in Place as Prescribed: Yes Pain Present Now: No Electronic Signature(s) Signed: 05/20/2021 11:10:38 AM By: Sandre Kitty Entered By: Sandre Kitty on 05/18/2021 10:42:28 -------------------------------------------------------------------------------- Lower Extremity Assessment Details Patient Name: Date of Service: [name], [name] LV [place] 05/18/2021 10:30 A M Medical Record Number: [id number] Patient Account Number: [id number] Date of Birth/Sex: Treating RN: [birth year] (60 y.o. Erie Noe Primary Care Leodis Alcocer: PCP, NO Other Clinician: Referring Devion Chriscoe: Treating Cherise Fedder/Extender: Cheree Ditto in Treatment: 9 Electronic Signature(s) Signed: 05/18/2021 5:22:11 PM By: Rhae Hammock RN Entered By: Rhae Hammock on 05/18/2021  10:54:11 -------------------------------------------------------------------------------- Multi Wound Chart Details Patient Name: Date of Service: [name], [name] LV [place] 05/18/2021 10:30 A M Medical Record Number: [id number] Patient Account Number: [id number] Date of Birth/Sex: Treating RN: 16-Jan-1961 (61 y.o. Erie Noe Primary Care Izen Petz: PCP, NO Other Clinician: Referring Tarik Teixeira: Treating Ramal Eckhardt/Extender: Cheree Ditto in Treatment: 9 Vital Signs Height(in): 73 Pulse(bpm): 84 Weight(lbs): 248 Blood Pressure(mmHg): 147/87 Body Mass Index(BMI): 33 Temperature(F): 98.2 Respiratory Rate(breaths/min): 18 Photos: [N/A:N/A] Right Gluteus N/A N/A Wound Location: Pressure Injury N/A N/A Wounding Event: Pressure Ulcer N/A N/A Primary Etiology: Coronary Artery Disease, N/A N/A Comorbid History: Hypertension, Type II Diabetes, Osteoarthritis 12/11/2020 N/A N/A Date Acquired: 9 N/A N/A Weeks of Treatment: Open N/A N/A Wound Status: 0.1x0.1x0.6 N/A N/A Measurements L x W x D (cm) 0.008 N/A N/A A (cm) : rea 0.005 N/A N/A Volume (cm) : 99.80% N/A N/A % Reduction in A rea: 100.00% N/A N/A % Reduction in Volume: Category/Stage III N/A N/A Classification: Medium N/A N/A Exudate A mount: Serosanguineous N/A N/A Exudate Type: red, brown N/A N/A Exudate Color: Distinct, outline attached N/A N/A Wound Margin: Large (67-100%) N/A N/A Granulation A mount: Red, Pink, Friable N/A N/A Granulation Quality: None Present (0%) N/A N/A Necrotic A mount: Fat Layer (Subcutaneous Tissue): Yes N/A N/A Exposed Structures: Fascia: No Tendon: No Muscle: No Joint: No Bone: No Medium (34-66%) N/A N/A Epithelialization: Treatment Notes Electronic Signature(s) Signed: 05/18/2021 5:13:37 PM By: Linton Ham MD Signed: 05/18/2021 5:22:11 PM By: Rhae Hammock RN Entered By: Linton Ham on 05/18/2021  11:10:06 -------------------------------------------------------------------------------- Multi-Disciplinary Care Plan Details Patient Name: Date of Service: [name], [name] LV [place] 05/18/2021 10:30 A M Medical Record Number: [id number] Patient Account Number: [id number] Date of Birth/Sex: Treating RN: [birth year] (60 y.o. Erie Noe Primary Care Raphael Fitzpatrick: PCP, NO Other Clinician: Referring Moriya Mitchell: Treating Tiegan Terpstra/Extender: Cheree Ditto in Treatment: 9 Multidisciplinary Care Plan reviewed with physician Active Inactive Nutrition Nursing Diagnoses: Impaired glucose control: actual or potential Potential for alteratiion in Nutrition/Potential for imbalanced nutrition  Goals: Patient/caregiver agrees to and verbalizes understanding of need to use nutritional supplements and/or vitamins as prescribed Date Initiated: 03/10/2021 Date Inactivated: 04/07/2021 Target Resolution Date: 04/09/2021 Goal Status: Met Patient/caregiver will maintain therapeutic glucose control Date Initiated: 03/10/2021 Target Resolution Date: 06/01/2021 Goal Status: Active Interventions: Assess HgA1c results as ordered upon admission and as needed Assess patient nutrition upon admission and as needed per policy Provide education on elevated blood sugars and impact on wound healing Provide education on nutrition Treatment Activities: Education provided on Nutrition : 03/10/2021 Notes: 04/07/21: Glucose control ongoing Pressure Nursing Diagnoses: Knowledge deficit related to causes and risk factors for pressure ulcer development Knowledge deficit related to management of pressures ulcers Potential for impaired tissue integrity related to pressure, friction, moisture, and shear Goals: Patient will remain free from development of additional pressure ulcers Date Initiated: 03/10/2021 Target Resolution Date: 06/03/2021 Goal Status: Active Patient/caregiver will verbalize risk factors for pressure ulcer  development Date Initiated: 03/10/2021 Date Inactivated: 04/07/2021 Target Resolution Date: 04/09/2021 Goal Status: Met Patient/caregiver will verbalize understanding of pressure ulcer management Date Initiated: 03/10/2021 Date Inactivated: 04/07/2021 Target Resolution Date: 04/09/2021 Goal Status: Met Interventions: Assess: immobility, friction, shearing, incontinence upon admission and as needed Assess offloading mechanisms upon admission and as needed Assess potential for pressure ulcer upon admission and as needed Provide education on pressure ulcers Notes: Wound/Skin Impairment Nursing Diagnoses: Impaired tissue integrity Knowledge deficit related to ulceration/compromised skin integrity Goals: Patient/caregiver will verbalize understanding of skin care regimen Date Initiated: 03/10/2021 Date Inactivated: 04/07/2021 Target Resolution Date: 04/09/2021 Goal Status: Met Ulcer/skin breakdown will have a volume reduction of 30% by week 4 Date Initiated: 03/10/2021 Date Inactivated: 04/07/2021 Target Resolution Date: 04/09/2021 Goal Status: Met Ulcer/skin breakdown will have a volume reduction of 50% by week 8 Date Initiated: 04/07/2021 Target Resolution Date: 06/04/2021 Goal Status: Active Interventions: Assess patient/caregiver ability to obtain necessary supplies Assess patient/caregiver ability to perform ulcer/skin care regimen upon admission and as needed Assess ulceration(s) every visit Provide education on ulcer and skin care Notes: Electronic Signature(s) Signed: 05/18/2021 5:22:11 PM By: Rhae Hammock RN Entered By: Rhae Hammock on 05/18/2021 10:54:56 -------------------------------------------------------------------------------- Pain Assessment Details Patient Name: Date of Service: [name], [name] LV [place] 05/18/2021 10:30 A M Medical Record Number: [id number] Patient Account Number: [id number] Date of Birth/Sex: Treating RN: [birth year] (60 y.o. Erie Noe Primary Care Monita Swier: PCP, NO Other Clinician: Referring Allisyn Kunz: Treating Joon Pohle/Extender: Cheree Ditto in Treatment: 9 Active Problems Location of Pain Severity and Description of Pain Patient Has Paino No Site Locations Pain Management and Medication Current Pain Management: Electronic Signature(s) Signed: 05/18/2021 5:22:11 PM By: Rhae Hammock RN Signed: 05/20/2021 11:10:38 AM By: Sandre Kitty Entered By: Sandre Kitty on 05/18/2021 10:42:59 -------------------------------------------------------------------------------- Patient/Caregiver Education Details Patient Name: Date of Service: [name] LV [place] 9/6/2022andnbsp10:30 [place] Record Number: [id number] Patient Account Number: [id number] Date of Birth/Gender: Treating RN: [birth year] (60 y.o. Erie Noe Primary Care Physician: PCP, NO Other Clinician: Referring Physician: Treating Physician/Extender: Cheree Ditto in Treatment: 9 Education Assessment Education Provided To: Patient Education Topics Provided Elevated Blood Sugar/ Impact on Healing: Methods: Explain/Verbal Responses: State content correctly Nutrition: Methods: Explain/Verbal Responses: Reinforcements needed Pressure: Methods: Explain/Verbal Responses: Reinforcements needed Wound/Skin Impairment: Methods: Explain/Verbal Responses: Reinforcements needed Electronic Signature(s) Signed: 05/18/2021 5:22:11 PM By: Rhae Hammock RN Entered By: Rhae Hammock on 05/18/2021 10:55:16 -------------------------------------------------------------------------------- Wound Assessment Details Patient Name: Date of Service: [name], [name] LV [place] 05/18/2021 10:30 A M Medical Record Number: [id number] Patient Account Number: [id number] Date of  Birth/Sex: Treating RN: [birth year] (59 y.o. Burnadette Pop, Lauren Primary Care Dacy Enrico: PCP, NO Other Clinician: Referring Wyman Meschke: Treating Buren Havey/Extender: Cheree Ditto in Treatment: 9 Wound Status Wound Number: 1 Primary Pressure Ulcer Etiology: Wound Location: Right Gluteus Wound Status: Open Wounding Event: Pressure Injury Comorbid Coronary Artery Disease, Hypertension, Type II Diabetes, Date Acquired: 12/11/2020 History: Osteoarthritis Weeks Of Treatment: 9 Clustered Wound: No Photos Wound Measurements Length: (cm) 0.1 Width: (cm) 0.1 Depth: (cm) 0.6 Area: (cm) 0.008 Volume: (cm) 0.005 Wound Description Classification: Category/Stage III Wound Margin: Distinct, outline attached Exudate Amount: Medium Exudate Type: Serosanguineous Exudate Color: red, brown Foul Odor After Cleansing: Slough/Fibrino % Reduction in Area: 99.8% % Reduction in Volume: 100% Epithelialization: Medium (34-66%) No No Wound Bed Granulation Amount: Large (67-100%) Exposed Structure Granulation Quality: Red, Pink, Friable Fascia Exposed: No Necrotic Amount: None Present (0%) Fat Layer (Subcutaneous Tissue) Exposed: Yes Tendon Exposed: No Muscle Exposed: No Joint Exposed: No Bone Exposed: No Electronic Signature(s) Signed: 05/18/2021 5:22:11 PM By: Rhae Hammock RN Signed: 05/20/2021 11:10:38 AM By: Sandre Kitty Entered By: Sandre Kitty on 05/18/2021 10:47:48 -------------------------------------------------------------------------------- Vitals Details Patient Name: Date of Service: [name] LV [place] 05/18/2021 10:30 A M Medical Record Number: [id number] Patient Account Number: [id number] Date of Birth/Sex: Treating RN: [birth year] (60 y.o. Burnadette Pop, Lauren Primary Care Avni Traore: PCP, NO Other Clinician: Referring Elaysha Bevard: Treating Fatoumata Albaugh/Extender: Cheree Ditto in Treatment: 9 Vital Signs Time Taken: 10:42 Temperature (F): 98.2 Height (in): 73 Pulse (bpm): 84 Weight (lbs): 248 Respiratory Rate (breaths/min): 18 Body Mass Index (BMI): 32.7 Blood Pressure (mmHg): 147/87 Reference Range: 80 - 120 mg /  dl Electronic Signature(s) Signed: 05/20/2021 11:10:38 AM By: Sandre Kitty Entered By: Sandre Kitty on 05/18/2021 10:42:45

## 2021-06-01 ENCOUNTER — Encounter (HOSPITAL_BASED_OUTPATIENT_CLINIC_OR_DEPARTMENT_OTHER): Payer: MEDICAID | Admitting: Internal Medicine

## 2021-12-30 IMAGING — MR MR HEAD W/O CM
1 series · 16 of 48 positions shown · non-contrast
Comparison: Prior head CT from 12/21/2020.

CLINICAL DATA: Follow-up examination for stroke.



[Series 9: 3d cow · axial · 0.5mm · 0.41mm/px · z∈[-74,+6]mm · 16 of 172 slices shown]
[im 1/172]
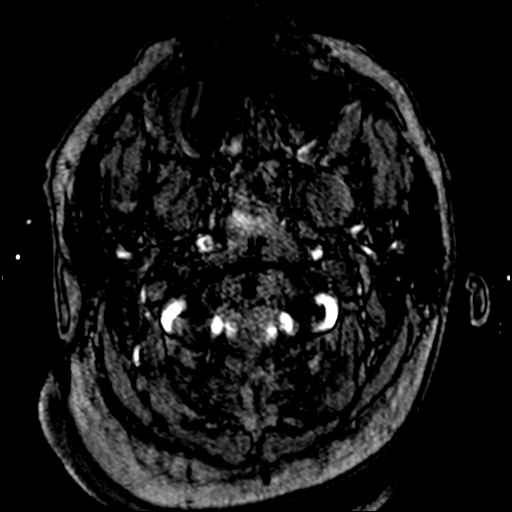
[im 4/172]
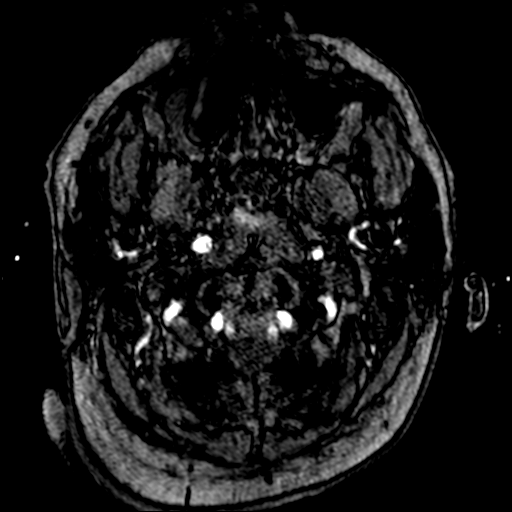
[im 8/172]
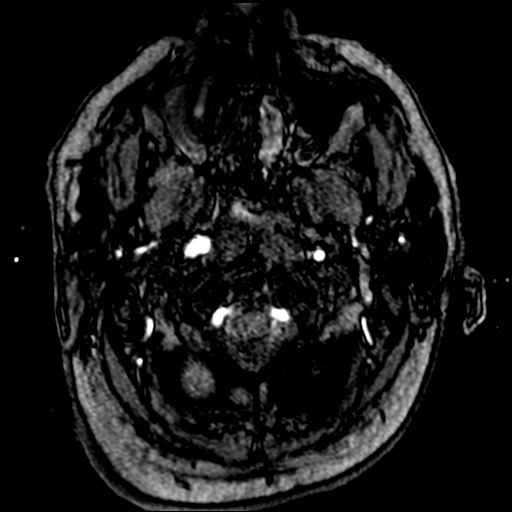
[im 11/172]
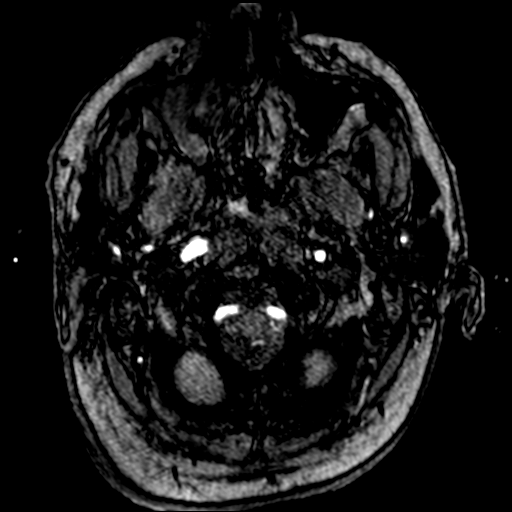
[im 15/172]
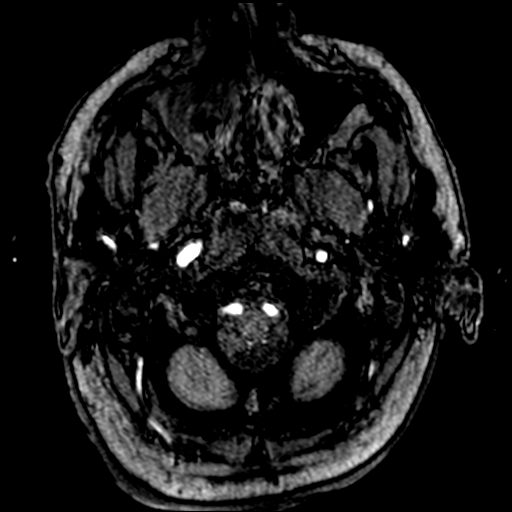
[im 19/172]
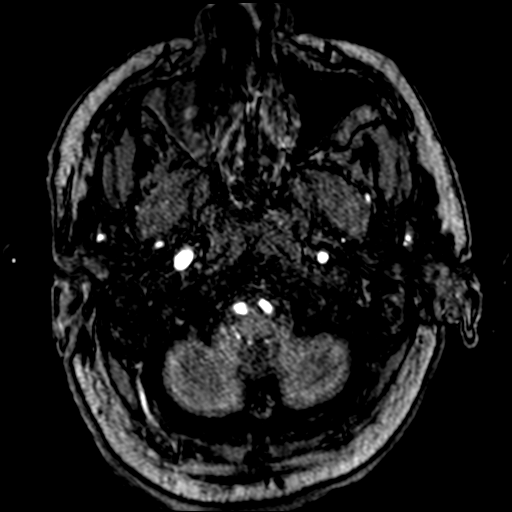
[im 30/172]
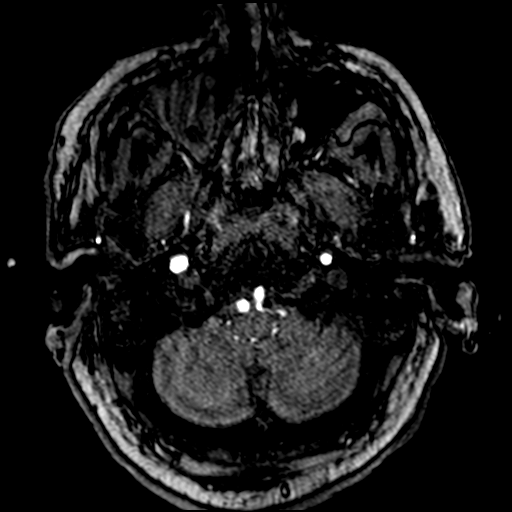
[im 33/172]
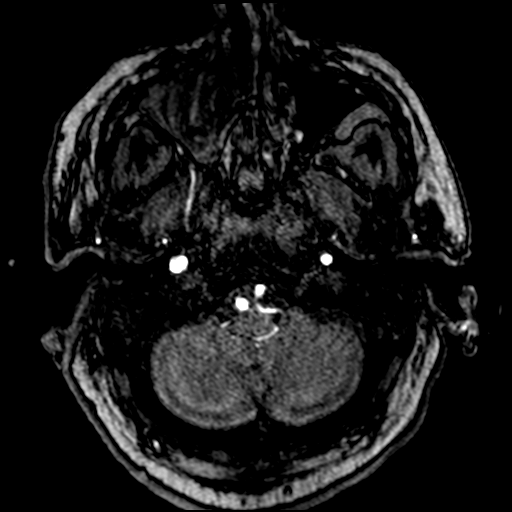
[im 55/172]
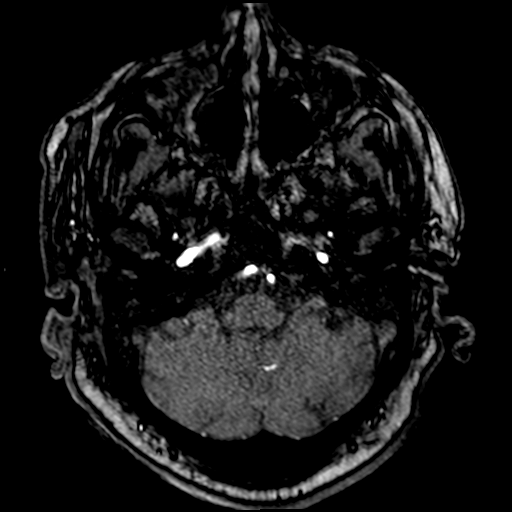
[im 77/172]
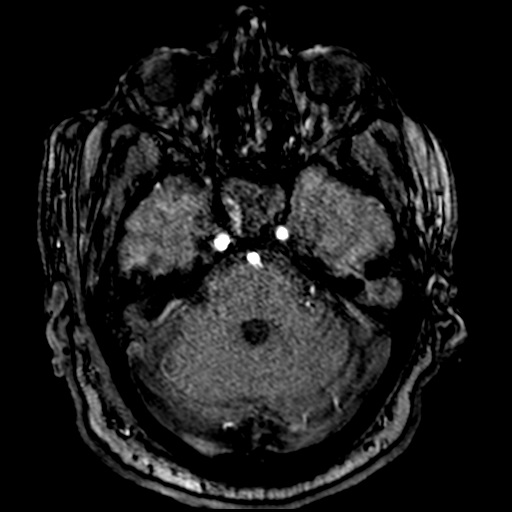
[im 88/172]
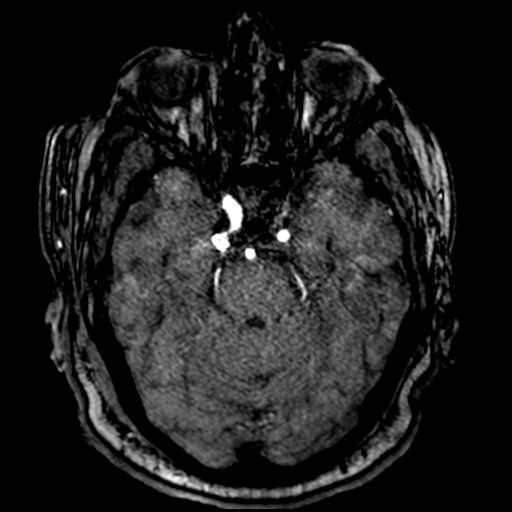
[im 99/172]
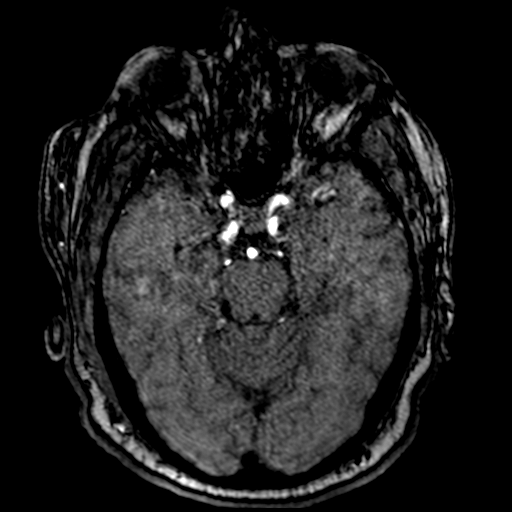
[im 121/172]
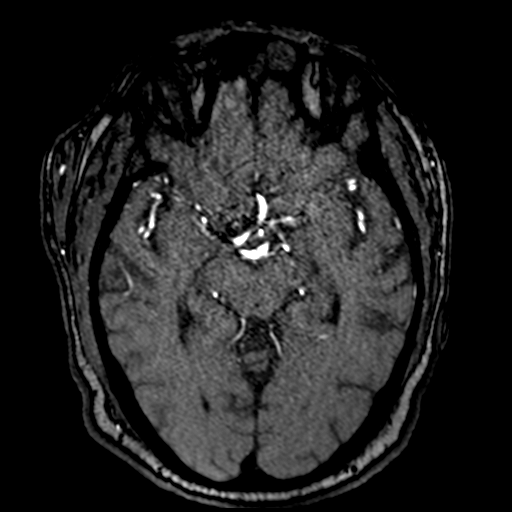
[im 142/172]
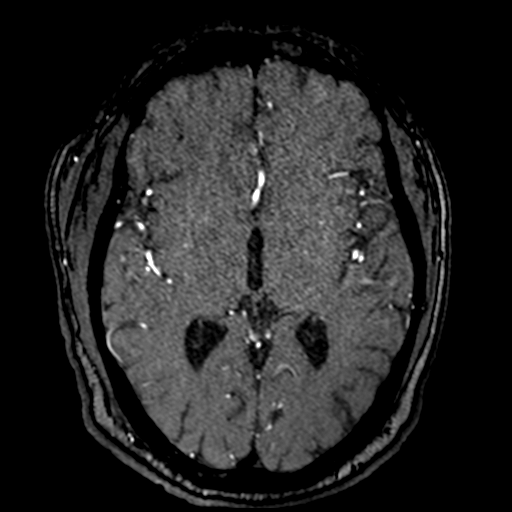
[im 146/172]
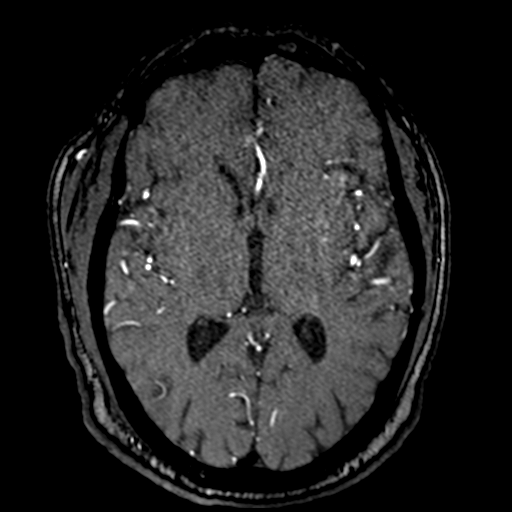
[im 164/172]
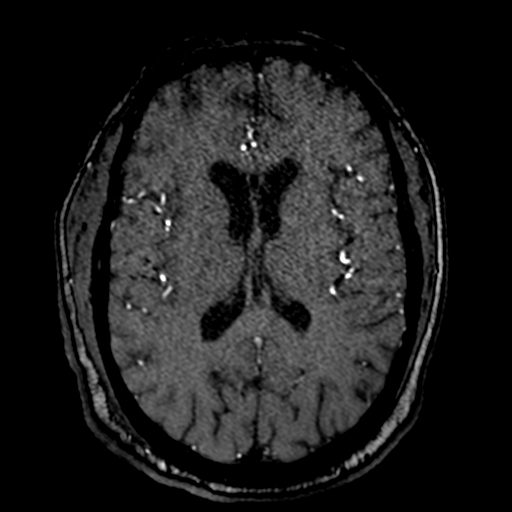

[16 of 48 positions shown; findings below may reference images not displayed]

FINDINGS: MRI HEAD FINDINGS

Brain: Examination moderately degraded by motion artifact.

Cerebral volume within normal limits for age. No significant
cerebral white matter disease evident on this motion degraded exam.

9 mm T2 hyperintense lesion present at the posterior right frontal
centrum semi ovale, corresponds with abnormality on prior CT. Lesion
demonstrates mild surrounding FLAIR signal intensity with T1
hypointensity. Mild surrounding diffusion signal without frank
restriction. Finding is consistent with a late subacute to chronic
ischemic infarct. No associated hemorrhage or mass effect.

No other diffusion abnormality to suggest acute or subacute
ischemia. Gray-white matter differentiation otherwise maintained. No
other areas of encephalomalacia to suggest chronic cortical
infarction. No other evidence for acute or chronic intracranial
hemorrhage.

No mass lesion, midline shift or mass effect. No hydrocephalus or
extra-axial fluid collection. Pituitary gland suprasellar region
normal. Midline structures intact.

Vascular: Major intracranial vascular flow voids are maintained.

Skull and upper cervical spine: Craniocervical junction within
normal limits. Bone marrow signal intensity diffusely decreased on
T1 weighted imaging, nonspecific, but most commonly related to
anemia, smoking, or obesity. No focal marrow replacing lesion.
Multifocal edema noted about the scalp.

Sinuses/Orbits: Globes and orbital soft tissues within normal
limits. Extensive chronic pan sinusitis noted. Underlying changes of
prior sinus surgery noted. Bilateral mastoid effusions noted. Fluid
seen within the nasopharynx. Patient appears to be intubated.

Other: None.

MRA HEAD FINDINGS

ANTERIOR CIRCULATION:

Examination degraded by motion artifact.

Visualized distal cervical segments of the internal carotid arteries
are patent with antegrade flow. Petrous, cavernous, and supraclinoid
segments patent without stenosis or other definite abnormality.
Right A1 widely patent. Left A1 hypoplastic, accounting for the
diminutive left ICA is compared to the right. Normal anterior
communicating artery complex. Anterior cerebral arteries patent to
their distal aspects without stenosis. No M1 stenosis or occlusion.
Normal MCA bifurcations. Distal MCA branches perfused and grossly
symmetric.

POSTERIOR CIRCULATION:

Both V4 segments widely patent to the vertebrobasilar junction
without stenosis. Both PICA origins patent and normal. Basilar
patent to its distal aspect without stenosis. Superior cerebellar
arteries patent bilaterally. Right PCA supplied via the basilar.
Left PCA supplied via the basilar as well as a prominent left
posterior communicating artery. PCAs patent to their distal aspects
without appreciable stenosis.

No aneurysm.

MRA NECK FINDINGS

AORTIC ARCH: Examination technically limited by motion artifact and
lack of IV contrast.

Visualized aortic arch normal caliber with normal branch pattern. No
hemodynamically significant stenosis about the origin of the great
vessels.

RIGHT CAROTID SYSTEM: Right CCA patent to the bifurcation without
stenosis. Right bifurcation low within the neck. No significant
atheromatous irregularity or narrowing about the right bifurcation
on this limited exam. Right ICA patent distally without stenosis,
evidence for dissection, or occlusion.

LEFT CAROTID SYSTEM: Left CCA patent from its origin to the
bifurcation. Left bifurcation markedly low within the neck. No
significant atheromatous narrowing or irregularity about the left
bifurcation. Left ICA patent distally without stenosis, evidence for
dissection, or occlusion.

VERTEBRAL ARTERIES: Both vertebral arteries arise from subclavian
arteries. Origins of the vertebral arteries not well assessed on
this limited exam. Vertebral arteries largely codominant and patent
without stenosis, evidence for dissection or occlusion.
IMPRESSION: MRI HEAD IMPRESSION:

1. Motion degraded exam.
2. 9 mm late subacute to chronic ischemic infarct involving the
posterior right frontal centrum semi ovale. No associated hemorrhage
or mass effect.
3. Otherwise grossly normal brain MRI for age.
4. Extensive chronic pan sinusitis with associated changes of prior
sinus surgery.

MRA HEAD IMPRESSION:

1. Motion degraded exam.
2. Grossly negative intracranial MRA. No large vessel occlusion,
hemodynamically significant stenosis, or other acute vascular
abnormality.

MRA NECK IMPRESSION:

1. Motion degraded exam.
2. Grossly negative MRA of the neck. No evidence for hemodynamically
significant or critical flow limiting stenosis.
3. Both carotid bifurcations are fairly low lying within the neck.

## 2022-01-01 IMAGING — DX DG CHEST 1V PORT
1 series · 1 of 1 positions shown · non-contrast
Comparison: Portable chest x-ray earlier same day [DATE] a.m. and
previously.

CLINICAL DATA: Bedside central venous catheter placement.

EXAM:
PORTABLE CHEST 1 VIEW [DATE] p.m.:

[chest ap]
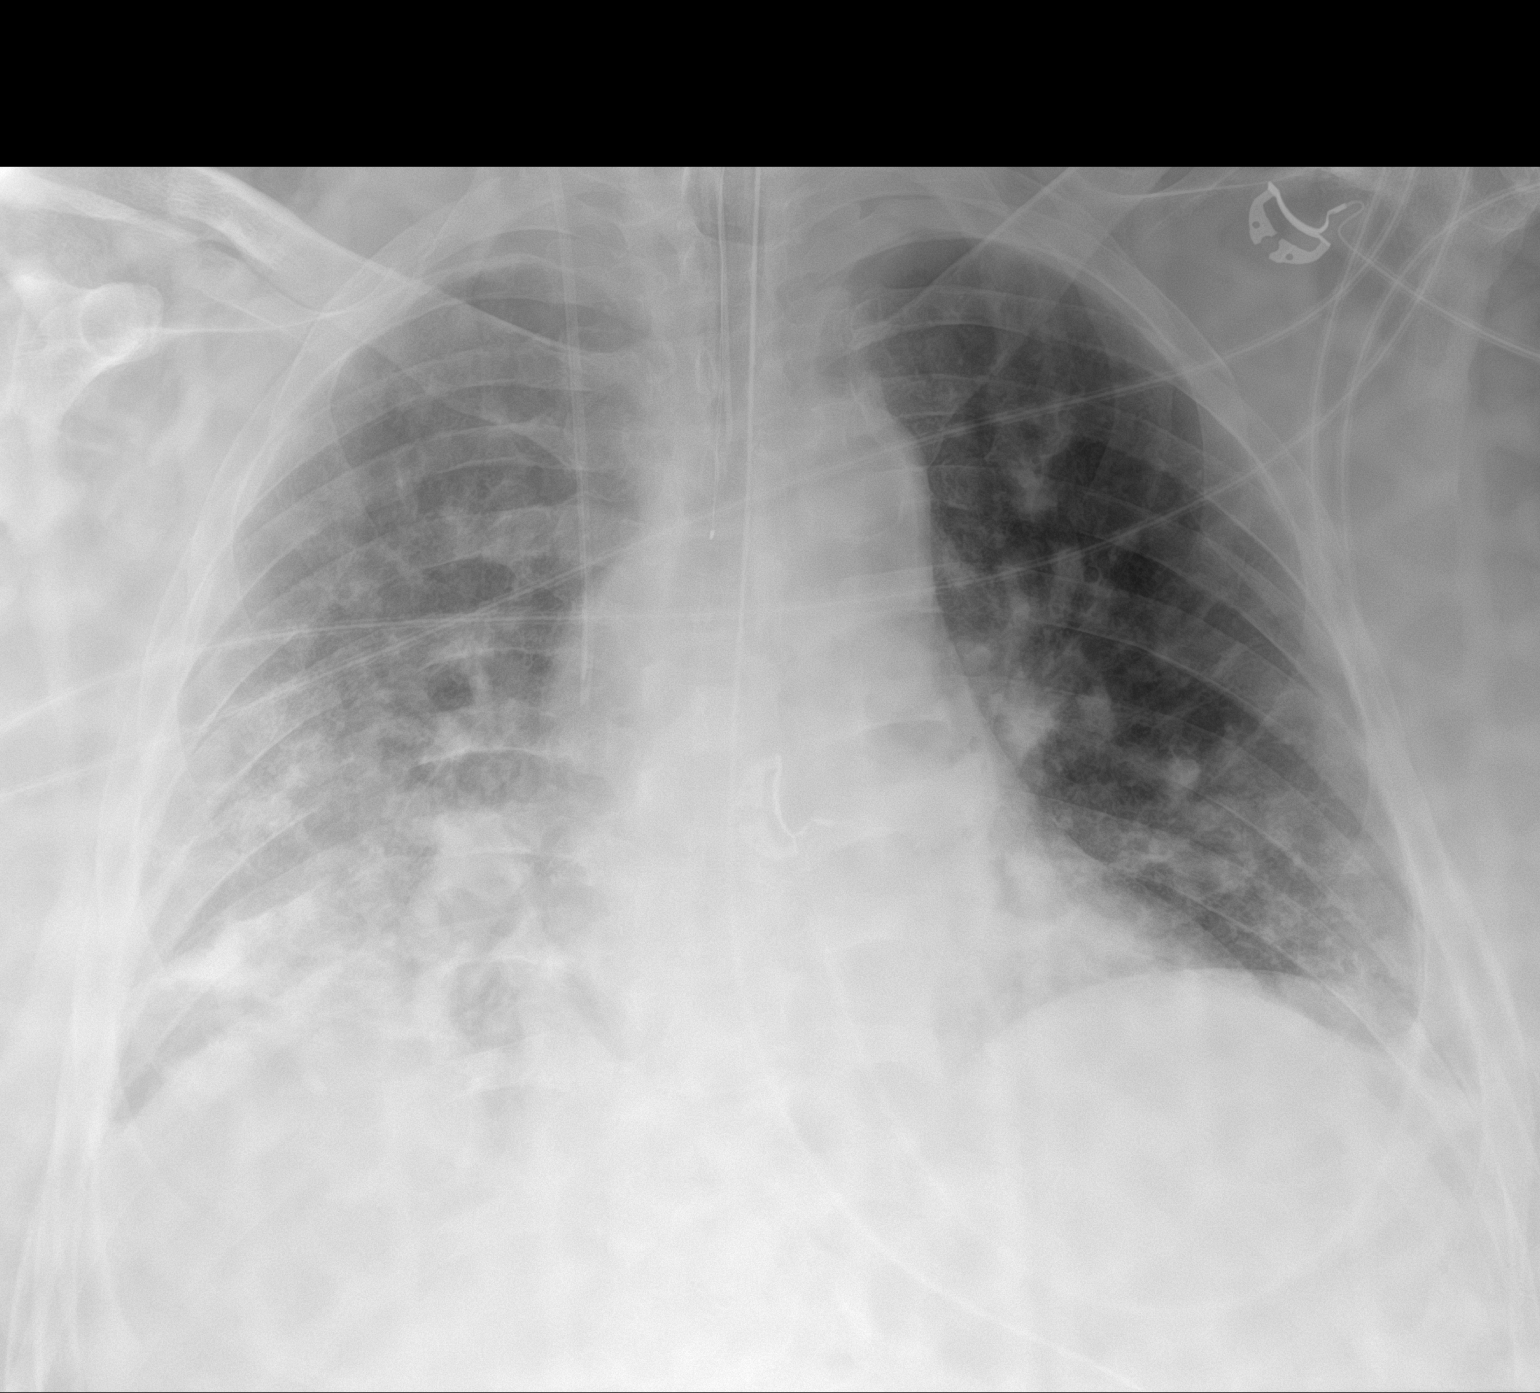

[1 of 1 positions shown; findings below may reference images not displayed]

FINDINGS: RIGHT jugular central venous catheter tip projects over the lower
SVC. No evidence of pneumothorax or mediastinal hematoma. What I
believe to be an esophageal probe projects over the mid esophagus.
Nasogastric tube courses below the diaphragm into the stomach.
Endotracheal tube tip in satisfactory position approximately 5 cm
above carina 3

Airspace consolidation in both lungs, most confluent at the RIGHT
lung base, increased since the examination earlier today.
IMPRESSION: 1. RIGHT jugular central venous catheter tip projects over the lower
SVC. No acute complicating features.
2. Remaining support apparatus satisfactory.
3. Worsening pneumonia in both lungs, most confluent at the RIGHT
lung base.

## 2022-01-01 IMAGING — DX DG CHEST 1V PORT
1 series · 1 of 1 positions shown · non-contrast
Comparison: 12/21/2020

CLINICAL DATA: Respiratory failure

EXAM:
PORTABLE CHEST 1 VIEW

[chest]
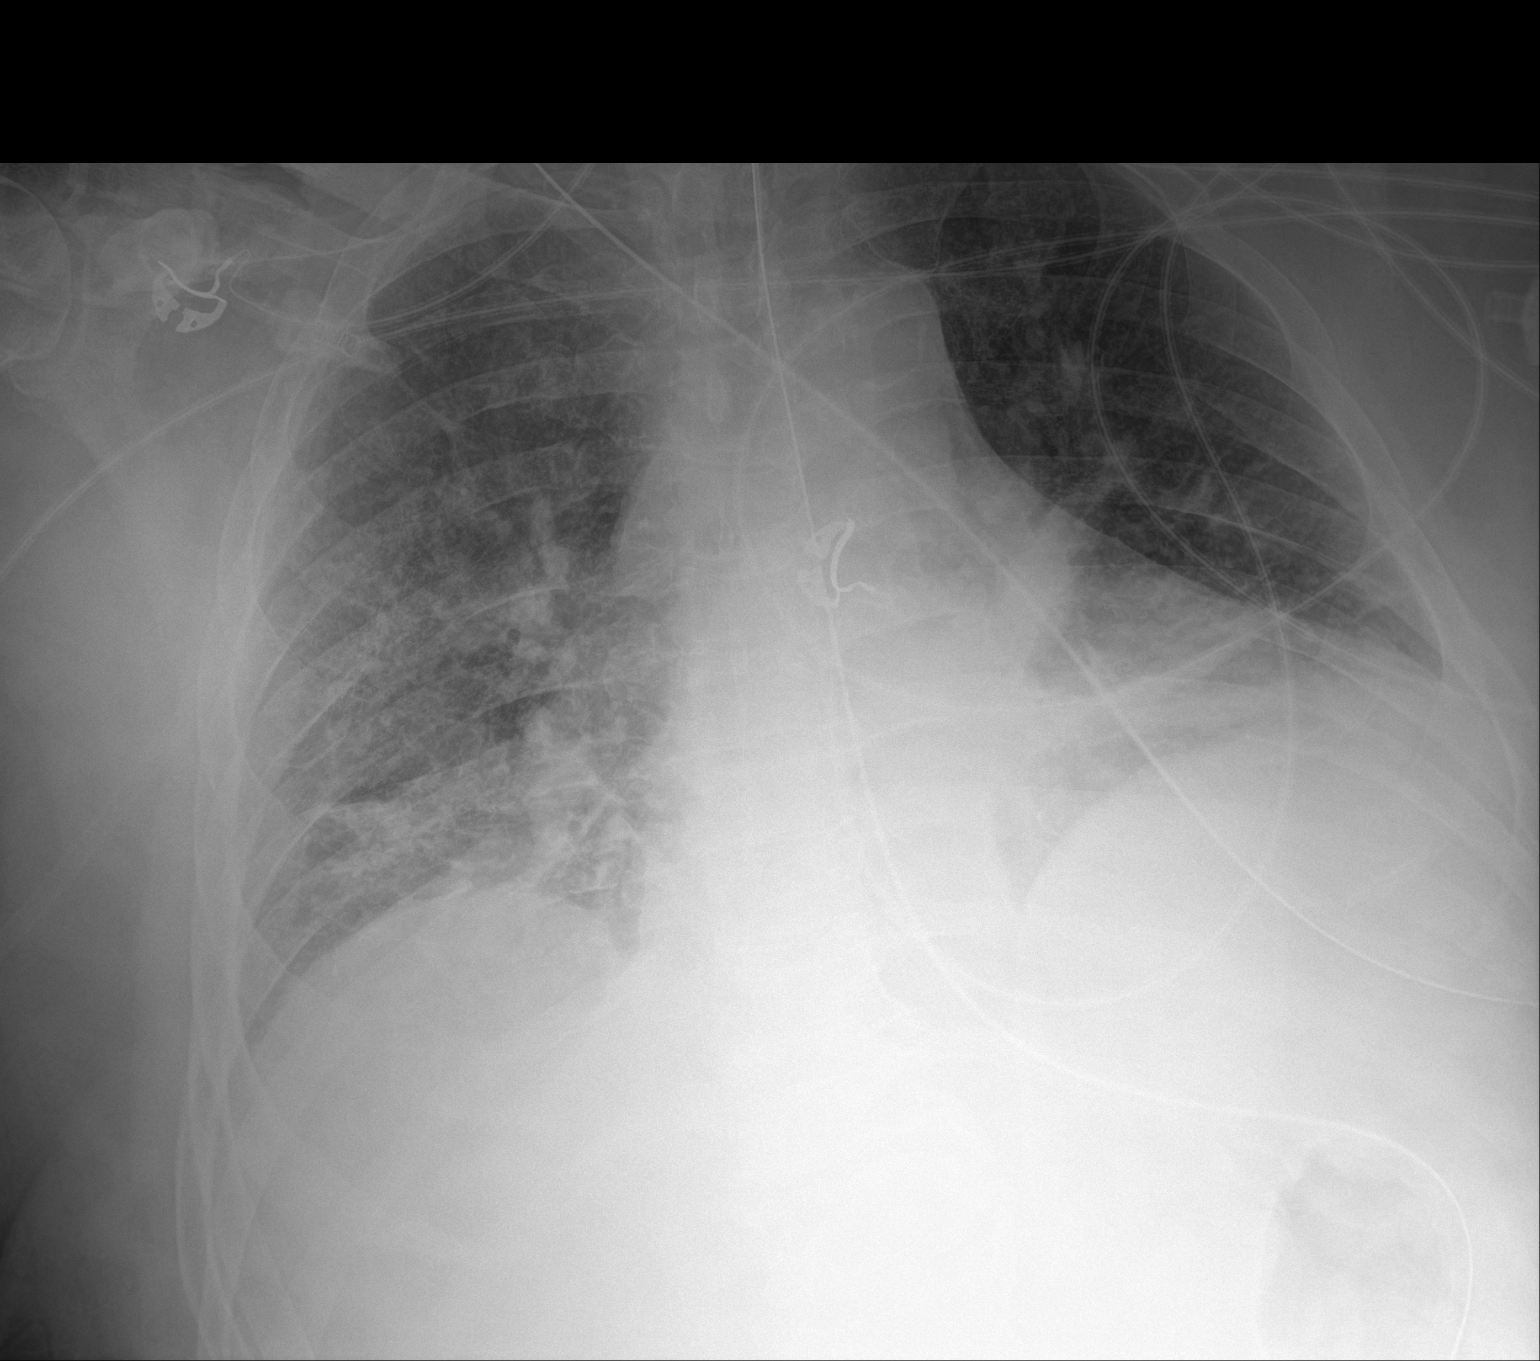

[1 of 1 positions shown; findings below may reference images not displayed]

FINDINGS: The endotracheal tube is not as well seen, but appears to be
partially withdrawn with its tip 5.2 cm above the carina.
Nasogastric tube extends into the upper abdomen. Pulmonary
insufflation is slightly improved. Bibasilar pulmonary infiltrates
appears stable when accounting for improved pulmonary insufflation.
No pneumothorax or pleural effusion. Cardiac size is mildly
enlarged, unchanged.
IMPRESSION: Support tubes in appropriate position.

Stable bibasilar pulmonary infiltrates.

Improved pulmonary insufflation.

## 2022-01-11 IMAGING — DX DG CHEST 1V PORT
2 series · 2 of 2 positions shown · non-contrast
Comparison: 01/01/2021

CLINICAL DATA: Respiratory failure.

EXAM:
PORTABLE CHEST 1 VIEW

[chest ap (1 of 2)]
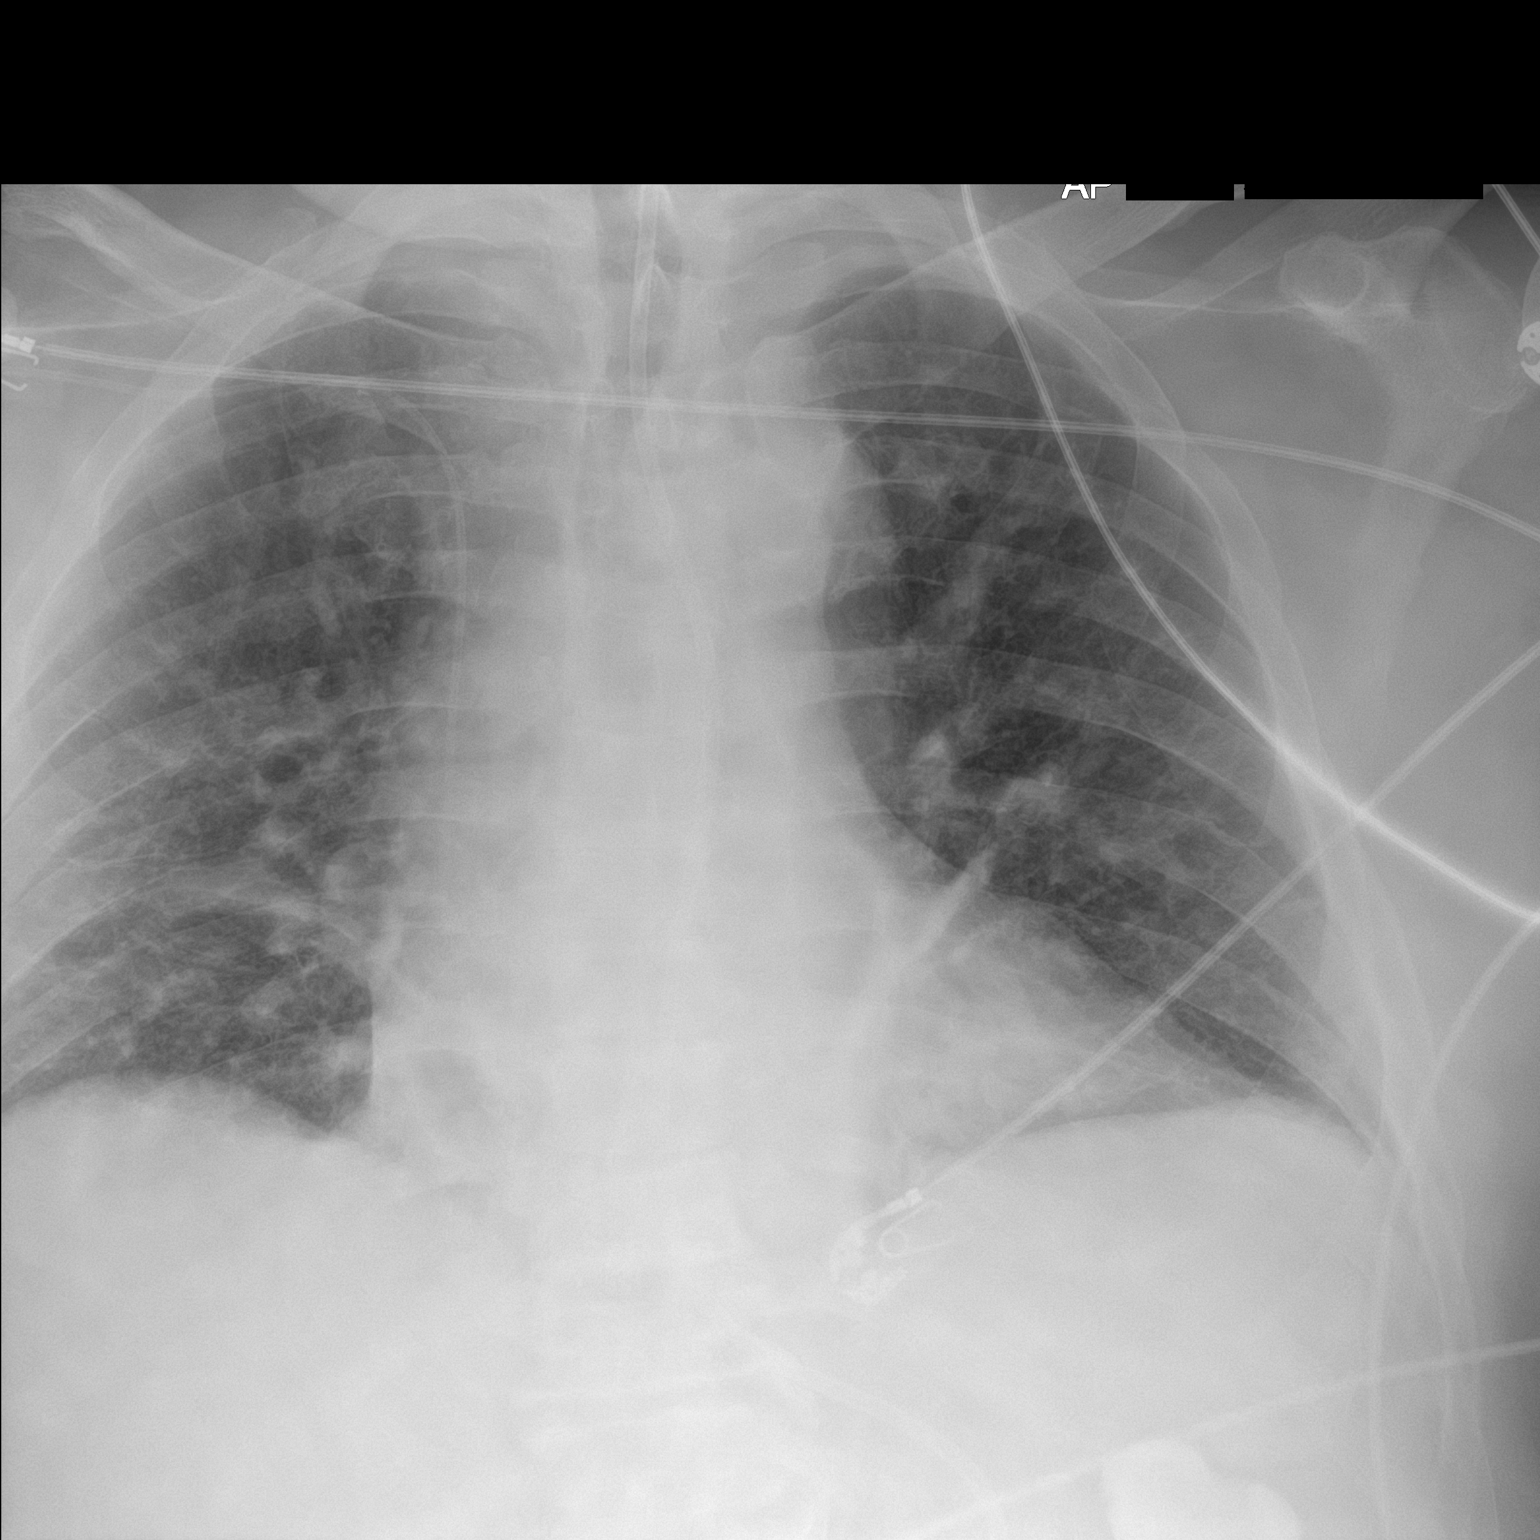

[chest ap (2 of 2)]
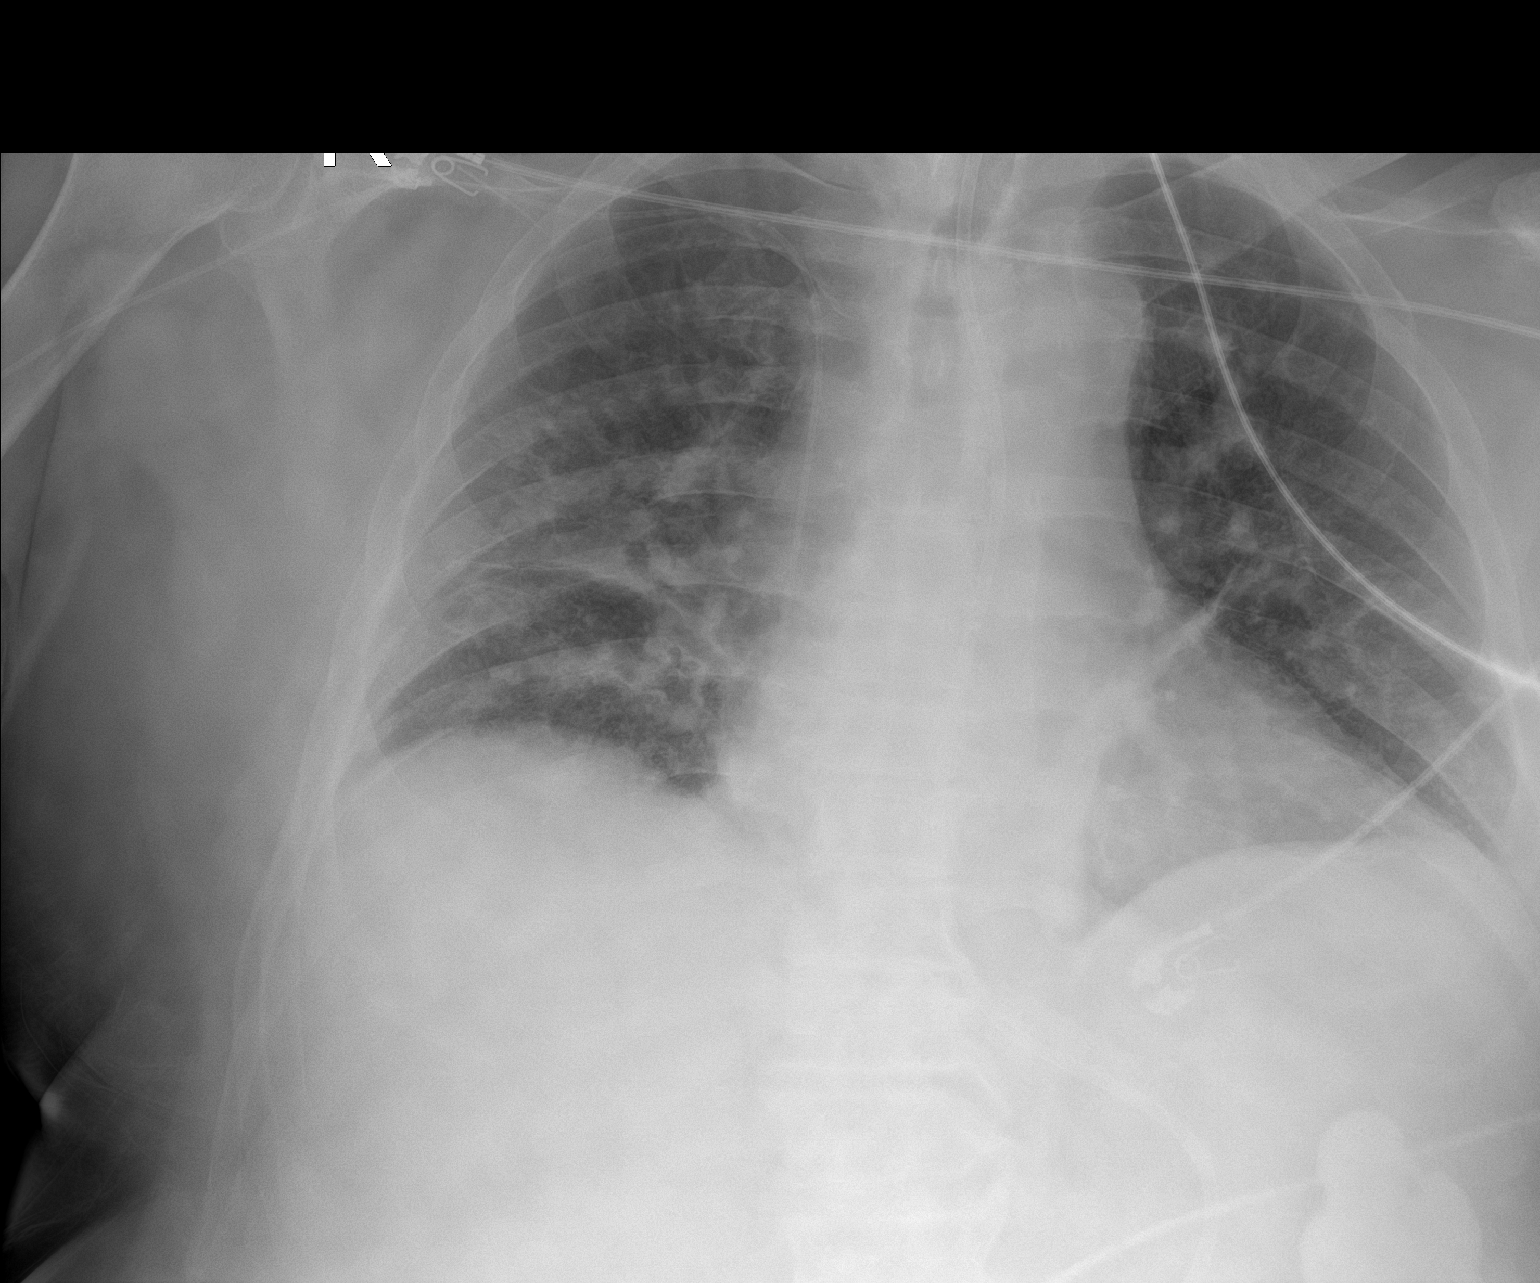

[2 of 2 positions shown; findings below may reference images not displayed]

FINDINGS: Right arm PICC line tip is in the cavoatrial junction. Feeding tube
tip is well below the GE junction. Stable cardiomediastinal
contours. Platelike atelectasis in the left lower lung is unchanged.
Pulmonary vascular congestion appears unchanged from previous exam.
Mild patchy airspace densities within the right midlung appear
similar to previous exam.
IMPRESSION: 1. No change in aeration to the lungs compared with previous exam.
2. Stable support apparatus.
